# Patient Record
Sex: Male | Born: 1955
Health system: Southern US, Community
[De-identification: ages and names within clinical notes are randomized; demographics above are authoritative.]

## PROBLEM LIST (undated history)

## (undated) DIAGNOSIS — K429 Umbilical hernia without obstruction or gangrene: Secondary | ICD-10-CM

## (undated) DIAGNOSIS — K635 Polyp of colon: Secondary | ICD-10-CM

## (undated) DIAGNOSIS — N400 Enlarged prostate without lower urinary tract symptoms: Secondary | ICD-10-CM

## (undated) DIAGNOSIS — C679 Malignant neoplasm of bladder, unspecified: Secondary | ICD-10-CM

## (undated) DIAGNOSIS — I10 Essential (primary) hypertension: Secondary | ICD-10-CM

## (undated) DIAGNOSIS — C649 Malignant neoplasm of unspecified kidney, except renal pelvis: Secondary | ICD-10-CM

## (undated) DIAGNOSIS — D649 Anemia, unspecified: Secondary | ICD-10-CM

## (undated) DIAGNOSIS — N183 Chronic kidney disease, stage 3 unspecified: Secondary | ICD-10-CM

## (undated) DIAGNOSIS — G629 Polyneuropathy, unspecified: Secondary | ICD-10-CM

## (undated) DIAGNOSIS — Z905 Acquired absence of kidney: Secondary | ICD-10-CM

## (undated) DIAGNOSIS — R188 Other ascites: Secondary | ICD-10-CM

## (undated) DIAGNOSIS — K219 Gastro-esophageal reflux disease without esophagitis: Secondary | ICD-10-CM

## (undated) DIAGNOSIS — Z8739 Personal history of other diseases of the musculoskeletal system and connective tissue: Secondary | ICD-10-CM

## (undated) DIAGNOSIS — R0600 Dyspnea, unspecified: Secondary | ICD-10-CM

## (undated) DIAGNOSIS — E119 Type 2 diabetes mellitus without complications: Secondary | ICD-10-CM

## (undated) DIAGNOSIS — C642 Malignant neoplasm of left kidney, except renal pelvis: Secondary | ICD-10-CM

## (undated) DIAGNOSIS — K7460 Unspecified cirrhosis of liver: Secondary | ICD-10-CM

## (undated) HISTORY — DX: Malignant neoplasm of left kidney, except renal pelvis: C64.2

## (undated) HISTORY — PX: KNEE SURGERY: SHX244

## (undated) HISTORY — DX: Polyp of colon: K63.5

## (undated) HISTORY — PX: EYE SURGERY: SHX253

## (undated) HISTORY — PX: NEPHRECTOMY: SHX65

## (undated) HISTORY — PX: BACK SURGERY: SHX140

---

## 2010-05-09 ENCOUNTER — Observation Stay (HOSPITAL_COMMUNITY)
Admission: AD | Admit: 2010-05-09 | Discharge: 2010-05-11 | Disposition: A | Discharge status: Home / Self Care | Payer: Self-pay | Source: Other Acute Inpatient Hospital | Attending: Urology | Admitting: Urology

## 2010-05-09 ENCOUNTER — Emergency Department: Payer: Self-pay | Admitting: Emergency Medicine

## 2010-05-09 DIAGNOSIS — Z79899 Other long term (current) drug therapy: Secondary | ICD-10-CM | POA: Insufficient documentation

## 2010-05-09 DIAGNOSIS — R109 Unspecified abdominal pain: Secondary | ICD-10-CM | POA: Insufficient documentation

## 2010-05-09 DIAGNOSIS — E119 Type 2 diabetes mellitus without complications: Secondary | ICD-10-CM | POA: Insufficient documentation

## 2010-05-09 DIAGNOSIS — M129 Arthropathy, unspecified: Secondary | ICD-10-CM | POA: Insufficient documentation

## 2010-05-09 DIAGNOSIS — R1012 Left upper quadrant pain: Secondary | ICD-10-CM | POA: Insufficient documentation

## 2010-05-09 DIAGNOSIS — R112 Nausea with vomiting, unspecified: Secondary | ICD-10-CM | POA: Insufficient documentation

## 2010-05-09 DIAGNOSIS — N2889 Other specified disorders of kidney and ureter: Principal | ICD-10-CM | POA: Insufficient documentation

## 2010-05-09 DIAGNOSIS — I10 Essential (primary) hypertension: Secondary | ICD-10-CM | POA: Insufficient documentation

## 2010-05-09 LAB — GLUCOSE, CAPILLARY
Glucose-Capillary: 223 mg/dL — ABNORMAL HIGH (ref 70–99)
Glucose-Capillary: 209 mg/dL — ABNORMAL HIGH (ref 70–99)

## 2010-05-09 LAB — HEMOGLOBIN AND HEMATOCRIT, BLOOD
HCT: 32.4 % — ABNORMAL LOW (ref 39.0–52.0)
Hemoglobin: 10.2 g/dL — ABNORMAL LOW (ref 13.0–17.0)

## 2010-05-10 LAB — BASIC METABOLIC PANEL
CO2: 29 mEq/L (ref 19–32)
Chloride: 98 mEq/L (ref 96–112)
Creatinine, Ser: 1.83 mg/dL — ABNORMAL HIGH (ref 0.4–1.5)
GFR calc Af Amer: 47 mL/min — ABNORMAL LOW (ref >60)

## 2010-05-10 LAB — GLUCOSE, CAPILLARY: Glucose-Capillary: 219 mg/dL — ABNORMAL HIGH (ref 70–99)

## 2010-05-11 LAB — HEMOGLOBIN AND HEMATOCRIT, BLOOD: HCT: 28.1 % — ABNORMAL LOW (ref 39.0–52.0)

## 2010-05-12 LAB — GLUCOSE, CAPILLARY
Glucose-Capillary: 219 mg/dL — ABNORMAL HIGH (ref 70–99)
Glucose-Capillary: 112 mg/dL — ABNORMAL HIGH (ref 70–99)

## 2010-05-14 ENCOUNTER — Emergency Department: Payer: Self-pay | Admitting: Emergency Medicine

## 2010-05-31 NOTE — H&P (Signed)
NAME:  Rodney Stewart, Rodney Stewart                 ACCOUNT NO.:  [id number]  MEDICAL RECORD NO.:  [id number]           PATIENT TYPE:  O  LOCATION:  1419                         FACILITY:  [facility]  PHYSICIAN:  Bertram Millard. Chanceler Pullin, M.D.DATE OF BIRTH:  [birth year]  DATE OF ADMISSION:  05/09/2010 DATE OF DISCHARGE:                             HISTORY & PHYSICAL   CHIEF COMPLAINT:  Left perinephric hematoma.  HISTORY OF PRESENT ILLNESS:  This is a 55 year old gentleman who began having left flank pain and left upper quadrant pain that awakened him from his sleep approximately 1 a.m.  This pain was constant and persistent without any radiation.  It was associated with nausea and vomiting x3.  He presented to [facility] at that time as his pain did not subside.  Workup consisted of CT of abdomen and pelvis which showed a left perinephric hematoma.  Hemoglobin was stable at 13.5.  His urine micro showed rbc's 0-2.  He was transferred to [facility] from [facility] for further urologic evaluation.  He denies any trauma.  Although, he states while out cutting limbs from a palm tree approximately 2 days ago, 1 limb did fall and hit him in the left upper quadrant.  He denied any severe pain from that trauma.  He denies any bruising or use of pain medications for it.  He does state that he uses Goody's Headache Powder at least 6/7 days per week.  He denies any fever, chills, gross hematuria, lower urinary tract symptoms at this time.  He denies any chest pain, shortness of breath, or dark tarry stools.  He states his pain is 3 on a scale of 1-10 at this time.  PAST MEDICAL HISTORY: 1. Hypertension. 2. Type 2 diabetes mellitus. 3. Arthritis.  PAST SURGICAL HISTORY: 1. Back surgery. 2. Right knee surgery.  ALLERGIES:  He has no known drug allergies.  MEDICATIONS: 1. BC powders 1 packet. 2. Bystolic 10 mg once daily. 3. Glipizide 5 mg once  daily. 4. Metformin 1000 mg twice daily. 5. Diovan 320 mg once daily.  FAMILY HISTORY:  He denies any family history of kidney cancer or bladder cancer.  He states that his grandfather had prostate cancer.  He also has a significant family history for hypertension and type 2 diabetes.  SOCIAL HISTORY:  He lives in [place].  He has 2 daughters, one in 10th grade and one a senior at [employer].  He denies tobacco use.  He does use alcohol occasionally.  REVIEW OF SYSTEMS:  As stated per HPI.  PHYSICAL EXAMINATION:  VITAL SIGNS:  Temperature 98.0, pulse 96, respirations 16, blood pressure 117/77. GENERAL:  The patient is a well-developed, well-nourished white male, in no acute distress. HEENT:  Normocephalic, atraumatic.  Oropharynx is clear. CARDIOVASCULAR:  Regular rate and rhythm. LUNGS:  Clear to auscultation. ABDOMEN:  Soft, nontender, nondistended.  No hepatosplenomegaly.  No CVA tenderness. EXTREMITIES:  Nontender.  No atrophy. NEUROLOGIC:  Remote, recent memory intact. SKIN:  Warm and dry intact.  LABORATORY DATA:  WBCs 4.8, hemoglobin 13.5, hematocrit 38.7, platelets 196.  Sodium is  133, potassium 4.2, chloride 95, CO2 of 23, BUN 16, creatinine 1.65, glucose 228.  Urinalysis, specific gravity 1.022, pH 5.0, blood 1+, nitrite negative, leukocytes negative.  Urine micro, wbc's 0-2, rbc's 0-2.  RADIOLOGIC DATA:  CT of abdomen and pelvis shows left perinephric hematoma.  IMPRESSION/PLAN:  Left perinephric hematoma.  We will admit for 23-hour observation and place on bed rest.  We will obtain serial hemoglobin checks.  Dr. Retta Diones will see this gentleman later this afternoon.     Delia Chimes, NP   ______________________________ Bertram Millard. Surabhi Gadea, M.D.    MA/MEDQ  D:  05/09/2010  T:  05/10/2010  Job:  [id number]  Electronically Signed by Delia Chimes NP on 05/10/2010 10:08:47 AM Electronically Signed by Marcine Matar M.D. on 05/31/2010 12:49:55 PM

## 2012-01-02 DIAGNOSIS — C649 Malignant neoplasm of unspecified kidney, except renal pelvis: Secondary | ICD-10-CM | POA: Insufficient documentation

## 2012-01-02 HISTORY — DX: Malignant neoplasm of unspecified kidney, except renal pelvis: C64.9

## 2012-04-26 ENCOUNTER — Emergency Department: Payer: Self-pay | Admitting: Emergency Medicine

## 2012-04-26 LAB — CBC
HCT: 27 % — ABNORMAL LOW (ref 40.0–52.0)
HGB: 8.2 g/dL — ABNORMAL LOW (ref 13.0–18.0)
MCH: 21.1 pg — ABNORMAL LOW (ref 26.0–34.0)
MCHC: 30.4 g/dL — ABNORMAL LOW (ref 32.0–36.0)

## 2012-04-26 LAB — URINALYSIS, COMPLETE: Squamous Epithelial: NONE SEEN

## 2012-04-26 LAB — BASIC METABOLIC PANEL
Creatinine: 1.26 mg/dL (ref 0.60–1.30)
EGFR (Non-African Amer.): >60
Osmolality: 267 (ref 275–301)
Potassium: 4.6 mmol/L (ref 3.5–5.1)
Sodium: 133 mmol/L — ABNORMAL LOW (ref 136–145)

## 2012-04-27 ENCOUNTER — Emergency Department: Payer: Self-pay | Admitting: Internal Medicine

## 2012-04-28 ENCOUNTER — Emergency Department: Payer: Self-pay | Admitting: Emergency Medicine

## 2012-04-28 LAB — CBC WITH DIFFERENTIAL/PLATELET
Basophil #: 0 10*3/uL (ref 0.0–0.1)
MCV: 69 fL — ABNORMAL LOW (ref 80–100)
Monocyte %: 10.3 %
Neutrophil #: 4.1 10*3/uL (ref 1.4–6.5)
Platelet: 357 10*3/uL (ref 150–440)
RDW: 16.9 % — ABNORMAL HIGH (ref 11.5–14.5)

## 2012-04-28 LAB — COMPREHENSIVE METABOLIC PANEL
Albumin: 3.1 g/dL — ABNORMAL LOW (ref 3.4–5.0)
Alkaline Phosphatase: 181 U/L — ABNORMAL HIGH (ref 50–136)
BUN: 13 mg/dL (ref 7–18)
Bilirubin,Total: 0.8 mg/dL (ref 0.2–1.0)
Co2: 26 mmol/L (ref 21–32)
EGFR (Non-African Amer.): 52 — ABNORMAL LOW
SGOT(AST): 25 U/L (ref 15–37)
Sodium: 135 mmol/L — ABNORMAL LOW (ref 136–145)

## 2012-06-16 ENCOUNTER — Ambulatory Visit: Payer: Self-pay

## 2013-02-04 DIAGNOSIS — Z905 Acquired absence of kidney: Secondary | ICD-10-CM | POA: Insufficient documentation

## 2013-11-30 DIAGNOSIS — N183 Chronic kidney disease, stage 3 unspecified: Secondary | ICD-10-CM | POA: Insufficient documentation

## 2014-07-02 DIAGNOSIS — I10 Essential (primary) hypertension: Secondary | ICD-10-CM | POA: Insufficient documentation

## 2014-08-20 DIAGNOSIS — H269 Unspecified cataract: Secondary | ICD-10-CM | POA: Insufficient documentation

## 2014-09-17 DIAGNOSIS — E113399 Type 2 diabetes mellitus with moderate nonproliferative diabetic retinopathy without macular edema, unspecified eye: Secondary | ICD-10-CM | POA: Insufficient documentation

## 2015-03-15 ENCOUNTER — Ambulatory Visit: Payer: Self-pay | Admitting: Family Medicine

## 2015-08-28 DIAGNOSIS — Z23 Encounter for immunization: Secondary | ICD-10-CM | POA: Diagnosis not present

## 2015-09-30 ENCOUNTER — Emergency Department: Payer: Medicare HMO

## 2015-09-30 ENCOUNTER — Inpatient Hospital Stay
Admission: EM | Admit: 2015-09-30 | Discharge: 2015-10-03 | DRG: 181 | Disposition: A | Discharge status: Home / Self Care | Payer: Medicare HMO | Attending: Internal Medicine | Admitting: Internal Medicine

## 2015-09-30 DIAGNOSIS — R079 Chest pain, unspecified: Secondary | ICD-10-CM | POA: Diagnosis not present

## 2015-09-30 DIAGNOSIS — J209 Acute bronchitis, unspecified: Secondary | ICD-10-CM | POA: Diagnosis present

## 2015-09-30 DIAGNOSIS — Z79899 Other long term (current) drug therapy: Secondary | ICD-10-CM | POA: Diagnosis not present

## 2015-09-30 DIAGNOSIS — I129 Hypertensive chronic kidney disease with stage 1 through stage 4 chronic kidney disease, or unspecified chronic kidney disease: Secondary | ICD-10-CM | POA: Diagnosis not present

## 2015-09-30 DIAGNOSIS — D696 Thrombocytopenia, unspecified: Secondary | ICD-10-CM | POA: Diagnosis not present

## 2015-09-30 DIAGNOSIS — D61818 Other pancytopenia: Secondary | ICD-10-CM | POA: Diagnosis not present

## 2015-09-30 DIAGNOSIS — K760 Fatty (change of) liver, not elsewhere classified: Secondary | ICD-10-CM | POA: Diagnosis present

## 2015-09-30 DIAGNOSIS — Z8249 Family history of ischemic heart disease and other diseases of the circulatory system: Secondary | ICD-10-CM | POA: Diagnosis not present

## 2015-09-30 DIAGNOSIS — F1010 Alcohol abuse, uncomplicated: Secondary | ICD-10-CM | POA: Diagnosis present

## 2015-09-30 DIAGNOSIS — D631 Anemia in chronic kidney disease: Secondary | ICD-10-CM | POA: Diagnosis not present

## 2015-09-30 DIAGNOSIS — K8020 Calculus of gallbladder without cholecystitis without obstruction: Secondary | ICD-10-CM | POA: Diagnosis not present

## 2015-09-30 DIAGNOSIS — N183 Chronic kidney disease, stage 3 (moderate): Secondary | ICD-10-CM | POA: Diagnosis not present

## 2015-09-30 DIAGNOSIS — C799 Secondary malignant neoplasm of unspecified site: Secondary | ICD-10-CM | POA: Diagnosis not present

## 2015-09-30 DIAGNOSIS — R591 Generalized enlarged lymph nodes: Secondary | ICD-10-CM | POA: Diagnosis not present

## 2015-09-30 DIAGNOSIS — N179 Acute kidney failure, unspecified: Secondary | ICD-10-CM | POA: Diagnosis present

## 2015-09-30 DIAGNOSIS — E875 Hyperkalemia: Secondary | ICD-10-CM | POA: Diagnosis not present

## 2015-09-30 DIAGNOSIS — C7800 Secondary malignant neoplasm of unspecified lung: Secondary | ICD-10-CM | POA: Diagnosis present

## 2015-09-30 DIAGNOSIS — R911 Solitary pulmonary nodule: Secondary | ICD-10-CM | POA: Diagnosis not present

## 2015-09-30 DIAGNOSIS — R9389 Abnormal findings on diagnostic imaging of other specified body structures: Secondary | ICD-10-CM

## 2015-09-30 DIAGNOSIS — Z809 Family history of malignant neoplasm, unspecified: Secondary | ICD-10-CM

## 2015-09-30 DIAGNOSIS — Z794 Long term (current) use of insulin: Secondary | ICD-10-CM

## 2015-09-30 DIAGNOSIS — E785 Hyperlipidemia, unspecified: Secondary | ICD-10-CM | POA: Diagnosis present

## 2015-09-30 DIAGNOSIS — Z905 Acquired absence of kidney: Secondary | ICD-10-CM

## 2015-09-30 DIAGNOSIS — R590 Localized enlarged lymph nodes: Secondary | ICD-10-CM | POA: Diagnosis not present

## 2015-09-30 DIAGNOSIS — E871 Hypo-osmolality and hyponatremia: Secondary | ICD-10-CM | POA: Diagnosis not present

## 2015-09-30 DIAGNOSIS — M25512 Pain in left shoulder: Secondary | ICD-10-CM | POA: Diagnosis not present

## 2015-09-30 DIAGNOSIS — Z87891 Personal history of nicotine dependence: Secondary | ICD-10-CM

## 2015-09-30 DIAGNOSIS — C649 Malignant neoplasm of unspecified kidney, except renal pelvis: Secondary | ICD-10-CM | POA: Diagnosis not present

## 2015-09-30 DIAGNOSIS — R8299 Other abnormal findings in urine: Secondary | ICD-10-CM | POA: Diagnosis not present

## 2015-09-30 DIAGNOSIS — E1122 Type 2 diabetes mellitus with diabetic chronic kidney disease: Secondary | ICD-10-CM | POA: Diagnosis not present

## 2015-09-30 DIAGNOSIS — K7460 Unspecified cirrhosis of liver: Secondary | ICD-10-CM | POA: Diagnosis present

## 2015-09-30 DIAGNOSIS — R161 Splenomegaly, not elsewhere classified: Secondary | ICD-10-CM | POA: Diagnosis not present

## 2015-09-30 DIAGNOSIS — N400 Enlarged prostate without lower urinary tract symptoms: Secondary | ICD-10-CM | POA: Diagnosis present

## 2015-09-30 DIAGNOSIS — Z85528 Personal history of other malignant neoplasm of kidney: Secondary | ICD-10-CM

## 2015-09-30 DIAGNOSIS — R52 Pain, unspecified: Secondary | ICD-10-CM

## 2015-09-30 HISTORY — DX: Benign prostatic hyperplasia without lower urinary tract symptoms: N40.0

## 2015-09-30 HISTORY — DX: Malignant neoplasm of unspecified kidney, except renal pelvis: C64.9

## 2015-09-30 HISTORY — DX: Chronic kidney disease, stage 3 unspecified: N18.30

## 2015-09-30 HISTORY — DX: Acquired absence of kidney: Z90.5

## 2015-09-30 HISTORY — DX: Chronic kidney disease, stage 3 (moderate): N18.3

## 2015-09-30 HISTORY — DX: Type 2 diabetes mellitus without complications: E11.9

## 2015-09-30 HISTORY — DX: Essential (primary) hypertension: I10

## 2015-09-30 LAB — CBC WITH DIFFERENTIAL/PLATELET
BASOS ABS: 0 10*3/uL (ref 0–0.1)
Basophils Relative: 1 %
EOS PCT: 4 %
Eosinophils Absolute: 0.1 10*3/uL (ref 0–0.7)
HEMATOCRIT: 31 % — AB (ref 40.0–52.0)
HEMOGLOBIN: 10.5 g/dL — AB (ref 13.0–18.0)
LYMPHS ABS: 0.5 10*3/uL — AB (ref 1.0–3.6)
LYMPHS PCT: 15 %
MCH: 31.1 pg (ref 26.0–34.0)
MCHC: 33.8 g/dL (ref 32.0–36.0)
MCV: 92.3 fL (ref 80.0–100.0)
Monocytes Absolute: 0.3 10*3/uL (ref 0.2–1.0)
Monocytes Relative: 10 %
NEUTROS ABS: 2.2 10*3/uL (ref 1.4–6.5)
NEUTROS PCT: 70 %
Platelets: 105 10*3/uL — ABNORMAL LOW (ref 150–440)
RBC: 3.36 MIL/uL — AB (ref 4.40–5.90)
RDW: 13.4 % (ref 11.5–14.5)
WBC: 3.1 10*3/uL — AB (ref 3.8–10.6)

## 2015-09-30 LAB — COMPREHENSIVE METABOLIC PANEL
ALK PHOS: 231 U/L — AB (ref 38–126)
ALT: 28 U/L (ref 17–63)
AST: 46 U/L — AB (ref 15–41)
Albumin: 3.3 g/dL — ABNORMAL LOW (ref 3.5–5.0)
Anion gap: 6 (ref 5–15)
BILIRUBIN TOTAL: 1.5 mg/dL — AB (ref 0.3–1.2)
BUN: 25 mg/dL — AB (ref 6–20)
CALCIUM: 9.6 mg/dL (ref 8.9–10.3)
CO2: 27 mmol/L (ref 22–32)
CREATININE: 3.53 mg/dL — AB (ref 0.61–1.24)
Chloride: 95 mmol/L — ABNORMAL LOW (ref 101–111)
GFR, EST AFRICAN AMERICAN: 20 mL/min — AB (ref >60)
GFR, EST NON AFRICAN AMERICAN: 18 mL/min — AB (ref >60)
Glucose, Bld: 376 mg/dL — ABNORMAL HIGH (ref 65–99)
Potassium: 5.3 mmol/L — ABNORMAL HIGH (ref 3.5–5.1)
Sodium: 128 mmol/L — ABNORMAL LOW (ref 135–145)
Total Protein: 7.4 g/dL (ref 6.5–8.1)

## 2015-09-30 LAB — PROTIME-INR
INR: 1.15
PROTHROMBIN TIME: 14.8 s (ref 11.4–15.2)

## 2015-09-30 LAB — TROPONIN I
Troponin I: <0.03 ng/mL (ref <0.03)
Troponin I: <0.03 ng/mL (ref <0.03)

## 2015-09-30 LAB — GLUCOSE, CAPILLARY
Glucose-Capillary: 293 mg/dL — ABNORMAL HIGH (ref 65–99)
Glucose-Capillary: 294 mg/dL — ABNORMAL HIGH (ref 65–99)
Glucose-Capillary: 270 mg/dL — ABNORMAL HIGH (ref 65–99)

## 2015-09-30 LAB — NA AND K (SODIUM & POTASSIUM), RAND UR
Potassium Urine: 27 mmol/L
Sodium, Ur: 47 mmol/L

## 2015-09-30 LAB — CK: CK TOTAL: 42 U/L — AB (ref 49–397)

## 2015-09-30 MED ORDER — ACETAMINOPHEN 325 MG PO TABS
650.0000 mg | ORAL_TABLET | Freq: Four times a day (QID) | ORAL | Status: DC | PRN
  Administered 2015-10-02: 650 mg via ORAL
  Filled 2015-09-30: qty 2

## 2015-09-30 MED ORDER — FENTANYL CITRATE (PF) 100 MCG/2ML IJ SOLN
100.0000 ug | INTRAMUSCULAR | Status: DC | PRN
  Administered 2015-09-30 – 2015-10-01 (×6): 100 ug via INTRAVENOUS
  Filled 2015-09-30 (×6): qty 2

## 2015-09-30 MED ORDER — ASPIRIN 325 MG PO TABS
325.0000 mg | ORAL_TABLET | Freq: Every day | ORAL | Status: DC
  Administered 2015-09-30 – 2015-10-01 (×2): 325 mg via ORAL
  Filled 2015-09-30 (×2): qty 1

## 2015-09-30 MED ORDER — HEPARIN SODIUM (PORCINE) 5000 UNIT/ML IJ SOLN
5000.0000 [IU] | Freq: Three times a day (TID) | INTRAMUSCULAR | Status: DC
  Administered 2015-09-30 – 2015-10-02 (×6): 5000 [IU] via SUBCUTANEOUS
  Filled 2015-09-30 (×5): qty 1

## 2015-09-30 MED ORDER — INSULIN ASPART 100 UNIT/ML ~~LOC~~ SOLN
0.0000 [IU] | Freq: Three times a day (TID) | SUBCUTANEOUS | Status: DC
  Administered 2015-09-30: 5 [IU] via SUBCUTANEOUS
  Administered 2015-10-01: 3 [IU] via SUBCUTANEOUS
  Administered 2015-10-01: 2 [IU] via SUBCUTANEOUS
  Administered 2015-10-02: 1 [IU] via SUBCUTANEOUS
  Filled 2015-09-30: qty 3
  Filled 2015-09-30: qty 5
  Filled 2015-09-30: qty 2
  Filled 2015-09-30: qty 1

## 2015-09-30 MED ORDER — ALBUTEROL SULFATE (2.5 MG/3ML) 0.083% IN NEBU: 2.5000 mg | INHALATION_SOLUTION | RESPIRATORY_TRACT | Status: DC | PRN

## 2015-09-30 MED ORDER — INSULIN ASPART 100 UNIT/ML ~~LOC~~ SOLN
0.0000 [IU] | Freq: Every day | SUBCUTANEOUS | Status: DC
  Administered 2015-09-30: 3 [IU] via SUBCUTANEOUS
  Administered 2015-10-02: 2 [IU] via SUBCUTANEOUS
  Filled 2015-09-30: qty 3
  Filled 2015-09-30: qty 2

## 2015-09-30 MED ORDER — FOLIC ACID 1 MG PO TABS
1.0000 mg | ORAL_TABLET | Freq: Every day | ORAL | Status: DC
Start: 2015-09-30 — End: 2015-10-03
  Administered 2015-10-01 – 2015-10-03 (×3): 1 mg via ORAL
  Filled 2015-09-30 (×3): qty 1

## 2015-09-30 MED ORDER — ONDANSETRON HCL 4 MG/2ML IJ SOLN
4.0000 mg | Freq: Four times a day (QID) | INTRAMUSCULAR | Status: DC | PRN
  Administered 2015-09-30 – 2015-10-02 (×2): 4 mg via INTRAVENOUS
  Filled 2015-09-30 (×2): qty 2

## 2015-09-30 MED ORDER — SODIUM CHLORIDE 0.9 % IV SOLN
INTRAVENOUS | Status: DC
  Administered 2015-09-30: 17:00:00 via INTRAVENOUS
  Administered 2015-10-01: 1000 mL via INTRAVENOUS
  Administered 2015-10-01: via INTRAVENOUS
  Administered 2015-10-01: 1000 mL via INTRAVENOUS
  Administered 2015-10-02 – 2015-10-03 (×2): via INTRAVENOUS

## 2015-09-30 MED ORDER — LORAZEPAM 1 MG PO TABS
1.0000 mg | ORAL_TABLET | Freq: Four times a day (QID) | ORAL | Status: DC | PRN
  Administered 2015-10-01: 1 mg via ORAL
  Filled 2015-09-30: qty 1

## 2015-09-30 MED ORDER — INSULIN DETEMIR 100 UNIT/ML ~~LOC~~ SOLN
48.0000 [IU] | Freq: Every day | SUBCUTANEOUS | Status: DC
  Administered 2015-09-30 – 2015-10-02 (×3): 48 [IU] via SUBCUTANEOUS
  Filled 2015-09-30 (×5): qty 0.48

## 2015-09-30 MED ORDER — THIAMINE HCL 100 MG/ML IJ SOLN: 100.0000 mg | Freq: Every day | INTRAMUSCULAR | Status: DC

## 2015-09-30 MED ORDER — GABAPENTIN 100 MG PO CAPS
100.0000 mg | ORAL_CAPSULE | Freq: Three times a day (TID) | ORAL | Status: DC
  Administered 2015-09-30 – 2015-10-03 (×9): 100 mg via ORAL
  Filled 2015-09-30 (×9): qty 1

## 2015-09-30 MED ORDER — INSULIN ASPART 100 UNIT/ML ~~LOC~~ SOLN
4.0000 [IU] | Freq: Once | SUBCUTANEOUS | Status: AC
  Administered 2015-09-30: 4 [IU] via INTRAVENOUS
  Filled 2015-09-30: qty 4

## 2015-09-30 MED ORDER — PRAVASTATIN SODIUM 20 MG PO TABS
20.0000 mg | ORAL_TABLET | Freq: Every day | ORAL | Status: DC
  Administered 2015-09-30 – 2015-10-02 (×3): 20 mg via ORAL
  Filled 2015-09-30 (×3): qty 1

## 2015-09-30 MED ORDER — SODIUM CHLORIDE 0.9% FLUSH
3.0000 mL | Freq: Two times a day (BID) | INTRAVENOUS | Status: DC
  Administered 2015-10-01: 3 mL via INTRAVENOUS

## 2015-09-30 MED ORDER — ADULT MULTIVITAMIN W/MINERALS CH
1.0000 | ORAL_TABLET | Freq: Every day | ORAL | Status: DC
  Administered 2015-10-01 – 2015-10-03 (×3): 1 via ORAL
  Filled 2015-09-30 (×3): qty 1

## 2015-09-30 MED ORDER — TAMSULOSIN HCL 0.4 MG PO CAPS
0.4000 mg | ORAL_CAPSULE | Freq: Every day | ORAL | Status: DC
  Administered 2015-09-30 – 2015-10-03 (×4): 0.4 mg via ORAL
  Filled 2015-09-30 (×4): qty 1

## 2015-09-30 MED ORDER — NEBIVOLOL HCL 5 MG PO TABS
20.0000 mg | ORAL_TABLET | Freq: Every day | ORAL | Status: DC
  Administered 2015-09-30 – 2015-10-03 (×4): 20 mg via ORAL
  Filled 2015-09-30 (×5): qty 4

## 2015-09-30 MED ORDER — ACETAMINOPHEN 650 MG RE SUPP: 650.0000 mg | Freq: Four times a day (QID) | RECTAL | Status: DC | PRN

## 2015-09-30 MED ORDER — VITAMIN B-1 100 MG PO TABS
100.0000 mg | ORAL_TABLET | Freq: Every day | ORAL | Status: DC
  Administered 2015-10-01 – 2015-10-03 (×3): 100 mg via ORAL
  Filled 2015-09-30 (×3): qty 1

## 2015-09-30 MED ORDER — SODIUM CHLORIDE 0.9 % IV BOLUS (SEPSIS)
1000.0000 mL | Freq: Once | INTRAVENOUS | Status: AC
  Administered 2015-09-30: 1000 mL via INTRAVENOUS

## 2015-09-30 MED ORDER — LORAZEPAM 2 MG/ML IJ SOLN: 1.0000 mg | Freq: Four times a day (QID) | INTRAMUSCULAR | Status: DC | PRN

## 2015-09-30 MED ORDER — ONDANSETRON HCL 4 MG PO TABS: 4.0000 mg | ORAL_TABLET | Freq: Four times a day (QID) | ORAL | Status: DC | PRN

## 2015-09-30 NOTE — H&P (Addendum)
[facility] [place] NAME: Rodney Stewart    MR#:  [id number]  DATE OF BIRTH:  October 02, 1955  DATE OF ADMISSION:  09/30/2015  PRIMARY CARE PHYSICIAN: No primary care provider on file.   REQUESTING/REFERRING PHYSICIAN: Merlyn Lot, MD  CHIEF COMPLAINT:   Chief Complaint  Patient presents with  . Chest Pain   left shoulder blade pain for 5 days. HISTORY OF PRESENT ILLNESS:  Rodney Stewart  is a 60 y.o. male with a known history of left shoulder blade pain for 5 days. Patient was treated with antibiotics for acute bronchitis 3 weeks ago. States that his cough at that time did significantly improved. He complains of left shoulder pain radiation to the chest. But he denies any nausea, vomiting or diaphoresis. He denies any weight loss. He was found to have acute renal failure with creatinine up to 3.53. PAST MEDICAL HISTORY:   Past Medical History:  Diagnosis Date  . BPH (benign prostatic hyperplasia)   . CKD (chronic kidney disease) stage 3, GFR 30-59 ml/min   . Clear cell renal cell carcinoma (HCC)   . Diabetes mellitus without complication ([place])   . History of nephrectomy   . Hypertension   single kidney.  PAST SURGICAL HISTORY:   Past Surgical History:  Procedure Laterality Date  . KNEE SURGERY    . NEPHRECTOMY      SOCIAL HISTORY:   Social History  Substance Use Topics  . Smoking status: Never Smoker  . Smokeless tobacco: Never Used  . Alcohol use 25.2 oz/week    42 Cans of beer per week     Comment: 5-6 cans a day    FAMILY HISTORY:   Family History  Problem Relation Age of Onset  . Cancer Mother   . Diabetes Father   . Hypertension Father     DRUG ALLERGIES:  No Known Allergies  REVIEW OF SYSTEMS:   Review of Systems  Constitutional: Negative for chills and fever.  HENT: Negative for sore throat.   Eyes: Negative for blurred vision and double vision.  Respiratory: Positive for cough. Negative for  hemoptysis, shortness of breath, wheezing and stridor.   Cardiovascular: Positive for chest pain. Negative for orthopnea and leg swelling.  Gastrointestinal: Negative for abdominal pain, blood in stool, melena, nausea and vomiting.  Genitourinary: Negative for dysuria, frequency and urgency.  Musculoskeletal: Positive for joint pain.       Left shoulder pain on the back  Skin: Negative for rash.  Neurological: Negative for dizziness, focal weakness, seizures and loss of consciousness.  Psychiatric/Behavioral: Negative for depression. The patient is not nervous/anxious.     MEDICATIONS AT HOME:   Prior to Admission medications   Medication Sig Start Date End Date Taking? Authorizing Provider  colchicine 0.6 MG tablet Take 0.6 mg by mouth daily.  06/26/15  Yes Historical Provider, MD  gabapentin (NEURONTIN) 100 MG capsule Take 100 mg by mouth 3 (three) times daily.  07/19/15  Yes Historical Provider, MD  LEVEMIR FLEXTOUCH 100 UNIT/ML Pen Inject 48 Units into the skin daily at 10 pm.  09/28/15  Yes Historical Provider, MD  losartan (COZAAR) 100 MG tablet Take 100 mg by mouth daily.  07/06/15  Yes Historical Provider, MD  lovastatin (MEVACOR) 20 MG tablet Take 20 mg by mouth daily at 6 PM.  07/20/15  Yes Historical Provider, MD  Nebivolol HCl 20 MG TABS Take 20 mg by mouth daily.   Yes Historical Provider, MD  NOVOLOG 100 UNIT/ML injection Inject 12 Units into the skin daily at 12 noon. At noon. 08/02/15  Yes Historical Provider, MD  tamsulosin (FLOMAX) 0.4 MG CAPS capsule Take 0.4 mg by mouth daily.  08/02/15  Yes Historical Provider, MD      VITAL SIGNS:  Blood pressure (!) 151/84, pulse 65, temperature 98.9 F (37.2 C), temperature source Oral, resp. rate 14, height '5\' 11"'$  (1.803 m), weight 190 lb (86.2 kg), SpO2 98 %.  PHYSICAL EXAMINATION:  Physical Exam  GENERAL:  60 y.o.-year-old patient lying in the bed with no acute distress.  EYES: Pupils equal, round, reactive to light and  accommodation. No scleral icterus. Extraocular muscles intact.  HEENT: Head atraumatic, normocephalic. Oropharynx and nasopharynx clear. moist mucosa. NECK:  Supple, no jugular venous distention. No thyroid enlargement, no tenderness.  LUNGS: Normal breath sounds bilaterally, no wheezing, rales,rhonchi or crepitation. No use of accessory muscles of respiration.  CARDIOVASCULAR: S1, S2 normal. No murmurs, rubs, or gallops.  ABDOMEN: Soft, nontender, nondistended. Bowel sounds present. No organomegaly or mass.  EXTREMITIES: No pedal edema, cyanosis, or clubbing.  NEUROLOGIC: Cranial nerves II through XII are intact. Muscle strength 5/5 in all extremities. Sensation intact. Gait not checked.  PSYCHIATRIC: The patient is alert and oriented x 3.  SKIN: No obvious rash, lesion, or ulcer.   LABORATORY PANEL:   CBC  Recent Labs Lab 09/30/15 1145  WBC 3.1*  HGB 10.5*  HCT 31.0*  PLT 105*   ------------------------------------------------------------------------------------------------------------------  Chemistries   Recent Labs Lab 09/30/15 1145  NA 128*  K 5.3*  CL 95*  CO2 27  GLUCOSE 376*  BUN 25*  CREATININE 3.53*  CALCIUM 9.6  AST 46*  ALT 28  ALKPHOS 231*  BILITOT 1.5*   ------------------------------------------------------------------------------------------------------------------  Cardiac Enzymes  Recent Labs Lab 09/30/15 1145  TROPONINI <0.03   ------------------------------------------------------------------------------------------------------------------  RADIOLOGY:  Dg Chest 2 View  Result Date: 09/30/2015 CLINICAL DATA:  Chest pain radiating. left shoulder pain. EXAM: CHEST  2 VIEW FINDINGS: Mediastinum is unremarkable. Left hilar mass is noted. Although this could be vascular, adenopathy or malignancy cannot be excluded. Questionable pulmonary nodule in the right upper lobe. IV contrast-enhanced chest CT suggest for further evaluation these findings.  Heart size normal. No pleural effusion or pneumothorax. No acute bony abnormality. IMPRESSION: 1. Left hilar fullness/mass. 2. Questionable right upper lobe pulmonary nodule. IV contrast-enhanced chest CT is suggested for further evaluation of these findings. Electronically Signed   By: Marcello Moores  Register   On: 09/30/2015 11:26   Ct Chest Wo Contrast  Result Date: 09/30/2015 CLINICAL DATA:  60 year old male with history of renal cancer presents for evaluation of a questionable left hilar mass on chest radiograph from earlier today. EXAM: CT CHEST WITHOUT CONTRAST TECHNIQUE: Multidetector CT imaging of the chest was performed following the standard protocol without IV contrast. COMPARISON:  Chest radiograph from earlier today. No prior chest CT. 05/09/2010 CT abdomen/pelvis. FINDINGS: Cardiovascular: Normal heart size. No significant pericardial fluid/thickening. Left main, left anterior descending, left circumflex and right coronary atherosclerosis. Atherosclerotic nonaneurysmal thoracic aorta. Normal caliber pulmonary arteries. Mediastinum/Nodes: No discrete thyroid nodules. Unremarkable esophagus. No axillary adenopathy. Bulky 4.2 cm subcarinal node (series 2/ image 73). Enlarged 1.8 cm left hilar node (series 2/ image 71). Enlarged 1.5 cm right hilar node (series 2/ image 69). No additional pathologically enlarged mediastinal or gross hilar nodes on this noncontrast study. Lungs/Pleura: No pneumothorax. No pleural effusion. There are greater than 20 solid pulmonary nodules of various size randomly distributed throughout  both lungs involving all lung lobes, largest 2.4 cm in the left lower lobe (series 4/ image 98), 1.7 cm in the right upper lobe (series 4/ image 47) and 0.8 cm in the left upper lobe (series 4/ image 35). No acute consolidative airspace disease. Upper abdomen: Liver surface is diffusely irregular indicating cirrhosis. Small volume perihepatic ascites. Cholelithiasis. Splenomegaly.  Musculoskeletal: No aggressive appearing focal osseous lesions. Mild thoracic spondylosis. IMPRESSION: 1. Bulky subcarinal and mild bilateral hilar lymphadenopathy, most consistent with metastatic disease given the reported history of renal cancer. 2. Innumerable (> than 20) pulmonary nodules randomly distributed throughout both lungs measuring up to 2.4 cm in the left lower lobe, most consistent with pulmonary metastases given the reported history of renal cancer . 3. Cirrhosis.  Small volume perihepatic ascites.  Splenomegaly. 4. Additional findings include aortic atherosclerosis, left main and 3 vessel coronary atherosclerosis and cholelithiasis. Electronically Signed   By: Ilona Sorrel M.D.   On: 09/30/2015 13:09   US Renal  Result Date: 09/30/2015 CLINICAL DATA:  Lung mass status post renal cancer EXAM: RENAL / URINARY TRACT ULTRASOUND COMPLETE COMPARISON:  CT abdomen 05/09/2010 FINDINGS: Right Kidney: Length: 11.4 cm. Echogenicity within normal limits. No mass or hydronephrosis visualized. Left Kidney: Surgically absent. Bladder: Appears normal for degree of bladder distention. Other:  Splenic enlargement measuring 910 mL. IMPRESSION: 1. Normal right kidney. 2. Surgically absent left kidney. 3. Splenomegaly. Electronically Signed   By: Kathreen Devoid   On: 09/30/2015 13:32      IMPRESSION AND PLAN:   Acute renal failure Start Normal saline IV, hold losartan and colchicine, follow-up BMP and renal ultrasound.  Hyponatremia. Continue normal saline and follow-up BMP.  Hyperkalemia. Continue IV fluid support and follow-up K.  Left back shoulder pain with Chest pain. Likely due to metastasis. Continue aspirin and follow-up troponin. Pain control.  History of renal carcinoma with Bulky subcarinal and mild bilateral hilar lymphadenopathy, lung nodules, most consistent with metastatic disease. Oncology consult.  Pancytopenia. Follow-up CBC and oncology consult. Anemia of chronic disease.  Stable.  Hypertension. Continue hypertension medication Diabetes. Start sliding scale and continue Levemir.  Alcohol abuse. Start CIWA protocol  All the records are reviewed and case discussed with ED provider. Management plans discussed with the patient, family and they are in agreement.  CODE STATUS: Full code  TOTAL TIME TAKING CARE OF THIS PATIENT: 58 minutes.    Demetrios Loll M.D on 09/30/2015 at 2:06 PM  Between 7am to 6pm - Pager - [phone]  After 6pm go to [email or web address] - [employer] [facility]  Office  [phone]  CC: Primary care physician; No primary care provider on file.   Note: This dictation was prepared with Dragon dictation along with smaller phrase technology. Any transcriptional errors that result from this process are unintentional.

## 2015-09-30 NOTE — ED Provider Notes (Signed)
[facility] Emergency Department Provider Note    First MD Initiated Contact with Patient 09/30/15 1119     (approximate)  I have reviewed the triage vital signs and the nursing notes.   HISTORY  Chief Complaint Chest Pain    HPI Rodney Stewart is a 60 y.o. male with a history of diabetes and hypertension presents with 5 days of left shoulder blade pain. Patient was treated with antibiotics for acute bronchitis 3 weeks ago. States that his cough at that time did significantly improve. Denies any cough or shortness of breath at this time but states that the pain is unrelenting. No fevers. States that the pain is focused on the left posterior shoulder. Does have unrelenting 5 out of 10 anterior chest pain no recent trauma. Denies any previous history of coronary disease. Is not a current smoker.   Past Medical History:  Diagnosis Date  . Diabetes mellitus without complication ([place])   . Hypertension     Patient Active Problem List   Diagnosis Date Noted  . ARF (acute renal failure) ([place]) 09/30/2015    History reviewed. No pertinent surgical history.  Prior to Admission medications   Medication Sig Start Date End Date Taking? Authorizing Provider  colchicine 0.6 MG tablet Take 0.6 mg by mouth daily.  06/26/15  Yes Historical Provider, MD  gabapentin (NEURONTIN) 100 MG capsule Take 100 mg by mouth 3 (three) times daily.  07/19/15  Yes Historical Provider, MD  LEVEMIR FLEXTOUCH 100 UNIT/ML Pen Inject 48 Units into the skin daily at 10 pm.  09/28/15  Yes Historical Provider, MD  losartan (COZAAR) 100 MG tablet Take 100 mg by mouth daily.  07/06/15  Yes Historical Provider, MD  lovastatin (MEVACOR) 20 MG tablet Take 20 mg by mouth daily at 6 PM.  07/20/15  Yes Historical Provider, MD  Nebivolol HCl 20 MG TABS Take 20 mg by mouth daily.   Yes Historical Provider, MD  NOVOLOG 100 UNIT/ML injection Inject 12 Units into the skin daily at 12 noon. At noon. 08/02/15  Yes  Historical Provider, MD  tamsulosin (FLOMAX) 0.4 MG CAPS capsule Take 0.4 mg by mouth daily.  08/02/15  Yes Historical Provider, MD    Allergies Review of patient's allergies indicates no known allergies.  No family history on file.  Social History Social History  Substance Use Topics  . Smoking status: Never Smoker  . Smokeless tobacco: Not on file  . Alcohol use No    Review of Systems Patient denies headaches, rhinorrhea, blurry vision, numbness, shortness of breath, chest pain, edema, cough, abdominal pain, nausea, vomiting, diarrhea, dysuria, fevers, rashes or hallucinations unless otherwise stated above in HPI. ____________________________________________   PHYSICAL EXAM:  VITAL SIGNS: Vitals:   09/30/15 1059 09/30/15 1152  BP: (!) 158/84 (!) 151/84  Pulse: 70 65  Resp: 18 14  Temp: 98.9 F (37.2 C)     Constitutional: Alert and oriented. Well appearing and in no acute distress. Eyes: Conjunctivae are normal. PERRL. EOMI. Head: Atraumatic. Nose: No congestion/rhinnorhea. Mouth/Throat: Mucous membranes are moist.  Oropharynx non-erythematous. Neck: No stridor. Painless ROM. No cervical spine tenderness to palpation Hematological/Lymphatic/Immunilogical: No cervical lymphadenopathy. Cardiovascular: Normal rate, regular rhythm. Grossly normal heart sounds.  Good peripheral circulation. Respiratory: Normal respiratory effort.  No retractions. Lungs CTAB. Gastrointestinal: Soft and nontender. No distention. No abdominal bruits. No CVA tenderness. Genitourinary:  Musculoskeletal: ttp of trapezius insertion of left scapula. No ecchymosis or rashes.  No lower extremity tenderness nor edema.  No joint effusions. Neurologic:  Normal speech and language. No gross focal neurologic deficits are appreciated. No gait instability. Skin:  Skin is warm, dry and intact. No rash noted. Psychiatric: Mood and affect are normal. Speech and behavior are  normal.  ____________________________________________   LABS (all labs ordered are listed, but only abnormal results are displayed)  Results for orders placed or performed during the hospital encounter of 09/30/15 (from the past 24 hour(s))  Troponin I     Status: None   Collection Time: 09/30/15 11:45 AM  Result Value Ref Range   Troponin I <0.03 <0.03 ng/mL  CBC with Differential/Platelet     Status: Abnormal   Collection Time: 09/30/15 11:45 AM  Result Value Ref Range   WBC 3.1 (L) 3.8 - 10.6 K/uL   RBC 3.36 (L) 4.40 - 5.90 MIL/uL   Hemoglobin 10.5 (L) 13.0 - 18.0 g/dL   HCT 31.0 (L) 40.0 - 52.0 %   MCV 92.3 80.0 - 100.0 fL   MCH 31.1 26.0 - 34.0 pg   MCHC 33.8 32.0 - 36.0 g/dL   RDW 13.4 11.5 - 14.5 %   Platelets 105 (L) 150 - 440 K/uL   Neutrophils Relative % 70 %   Neutro Abs 2.2 1.4 - 6.5 K/uL   Lymphocytes Relative 15 %   Lymphs Abs 0.5 (L) 1.0 - 3.6 K/uL   Monocytes Relative 10 %   Monocytes Absolute 0.3 0.2 - 1.0 K/uL   Eosinophils Relative 4 %   Eosinophils Absolute 0.1 0 - 0.7 K/uL   Basophils Relative 1 %   Basophils Absolute 0.0 0 - 0.1 K/uL  Comprehensive metabolic panel     Status: Abnormal   Collection Time: 09/30/15 11:45 AM  Result Value Ref Range   Sodium 128 (L) 135 - 145 mmol/L   Potassium 5.3 (H) 3.5 - 5.1 mmol/L   Chloride 95 (L) 101 - 111 mmol/L   CO2 27 22 - 32 mmol/L   Glucose, Bld 376 (H) 65 - 99 mg/dL   BUN 25 (H) 6 - 20 mg/dL   Creatinine, Ser 3.53 (H) 0.61 - 1.24 mg/dL   Calcium 9.6 8.9 - 10.3 mg/dL   Total Protein 7.4 6.5 - 8.1 g/dL   Albumin 3.3 (L) 3.5 - 5.0 g/dL   AST 46 (H) 15 - 41 U/L   ALT 28 17 - 63 U/L   Alkaline Phosphatase 231 (H) 38 - 126 U/L   Total Bilirubin 1.5 (H) 0.3 - 1.2 mg/dL   GFR calc non Af Amer 18 (L) >60 mL/min   GFR calc Af Amer 20 (L) >60 mL/min   Anion gap 6 5 - 15  CK     Status: Abnormal   Collection Time: 09/30/15 11:45 AM  Result Value Ref Range   Total CK 42 (L) 49 - 397 U/L  Protime-INR      Status: None   Collection Time: 09/30/15 11:45 AM  Result Value Ref Range   Prothrombin Time 14.8 11.4 - 15.2 seconds   INR 1.15    ____________________________________________  EKG My review and personal interpretation at Time: 10:58   Indication: chest pain  Rate: 70  Rhythm: nsr Axis: normal Other: normal EKG, normal intervals ____________________________________________  RADIOLOGY  I personally reviewed all radiographic images and reviewed radiology reports and findings.  These findings were personally discussed with the patient.  Please see medical record for radiology report. ____________________________________________   PROCEDURES  Procedure(s) performed: none    Critical Care  performed: no ____________________________________________   INITIAL IMPRESSION / ASSESSMENT AND PLAN / ED COURSE  Pertinent labs & imaging results that were available during my care of the patient were reviewed by me and considered in my medical decision making (see chart for details).  DDX: ACS, pericarditis, esophagitis, boerhaaves, pe, dissection, pna, bronchitis, costochondritis   Rodney Stewart is a 60 y.o. who presents to the ED with chief complaint of chest pain for the past 5 days. No clinical exam findings to suggest pneumonia but will order chest x-ray to further evaluate. EKG is normal and reassuring. Patient afebrile and otherwise hemodynamically stable.The patient will be placed on continuous pulse oximetry and telemetry for monitoring.  Laboratory evaluation will be sent to evaluate for the above complaints.     Clinical Course  Comment By Time  Patient does have evidence of acute renal insufficiency without metabolic acidosis. Does appear to be intrinsic cystoscopy with hyperkalemia. Also with leukopenia and low platelets.  Chest x-ray does show evidence of hilar mass concerning for metastasis. Will order CT scan to further characterize. Merlyn Lot, MD 09/29 1233  The  patient's daughter took me aside after speaking with the patient and gave me further information the patient has had decreased oral intake but does significant alcohol abuse history Merlyn Lot, MD 09/29 1301  I discussed the findings and results of CT imaging with the patient and concern for probable metastatic disease. Transferred to the best of my ability.  Have discussed with the patient and available family all diagnostics and treatments performed thus far and all questions were answered to the best of my ability. The patient demonstrates understanding and agreement with plan.  Merlyn Lot, MD 09/29 1343     ____________________________________________   FINAL CLINICAL IMPRESSION(S) / ED DIAGNOSES  Final diagnoses:  Chest pain, unspecified chest pain type  Abnormal finding on imaging  AKI (acute kidney injury) ([place])      NEW MEDICATIONS STARTED DURING THIS VISIT:  New Prescriptions   No medications on file     Note:  This document was prepared using Dragon voice recognition software and may include unintentional dictation errors.    Merlyn Lot, MD 09/30/15 1415

## 2015-09-30 NOTE — ED Notes (Signed)
Patient transported to X-ray 

## 2015-09-30 NOTE — ED Triage Notes (Signed)
CP to left shoulder blade radiating to center of chest X 5 days. Pt recently treated for bronchitis X 3 weeks ago. Non productive cough. Constant CP. Pt alert and oriented X4, active, cooperative, pt in NAD. RR even and unlabored, color WNL.

## 2015-10-01 ENCOUNTER — Inpatient Hospital Stay: Payer: Medicare HMO

## 2015-10-01 DIAGNOSIS — Z85528 Personal history of other malignant neoplasm of kidney: Secondary | ICD-10-CM

## 2015-10-01 DIAGNOSIS — C7800 Secondary malignant neoplasm of unspecified lung: Secondary | ICD-10-CM

## 2015-10-01 DIAGNOSIS — I129 Hypertensive chronic kidney disease with stage 1 through stage 4 chronic kidney disease, or unspecified chronic kidney disease: Secondary | ICD-10-CM

## 2015-10-01 DIAGNOSIS — D631 Anemia in chronic kidney disease: Secondary | ICD-10-CM

## 2015-10-01 DIAGNOSIS — N179 Acute kidney failure, unspecified: Secondary | ICD-10-CM

## 2015-10-01 DIAGNOSIS — C649 Malignant neoplasm of unspecified kidney, except renal pelvis: Secondary | ICD-10-CM

## 2015-10-01 DIAGNOSIS — N183 Chronic kidney disease, stage 3 (moderate): Secondary | ICD-10-CM

## 2015-10-01 DIAGNOSIS — D696 Thrombocytopenia, unspecified: Secondary | ICD-10-CM

## 2015-10-01 LAB — CBC
HCT: 26.8 % — ABNORMAL LOW (ref 40.0–52.0)
HEMOGLOBIN: 9.2 g/dL — AB (ref 13.0–18.0)
MCH: 31.6 pg (ref 26.0–34.0)
MCHC: 34.3 g/dL (ref 32.0–36.0)
MCV: 92 fL (ref 80.0–100.0)
PLATELETS: 97 10*3/uL — AB (ref 150–440)
RBC: 2.91 MIL/uL — AB (ref 4.40–5.90)
RDW: 13.2 % (ref 11.5–14.5)
WBC: 4.1 10*3/uL (ref 3.8–10.6)

## 2015-10-01 LAB — GLUCOSE, CAPILLARY
GLUCOSE-CAPILLARY: 226 mg/dL — AB (ref 65–99)
GLUCOSE-CAPILLARY: 183 mg/dL — AB (ref 65–99)
Glucose-Capillary: 110 mg/dL — ABNORMAL HIGH (ref 65–99)
Glucose-Capillary: 181 mg/dL — ABNORMAL HIGH (ref 65–99)

## 2015-10-01 LAB — BASIC METABOLIC PANEL
ANION GAP: 7 (ref 5–15)
BUN: 28 mg/dL — ABNORMAL HIGH (ref 6–20)
CALCIUM: 8.2 mg/dL — AB (ref 8.9–10.3)
CHLORIDE: 104 mmol/L (ref 101–111)
CO2: 22 mmol/L (ref 22–32)
CREATININE: 3.81 mg/dL — AB (ref 0.61–1.24)
GFR, EST AFRICAN AMERICAN: 19 mL/min — AB (ref >60)
GFR, EST NON AFRICAN AMERICAN: 16 mL/min — AB (ref >60)
Glucose, Bld: 137 mg/dL — ABNORMAL HIGH (ref 65–99)
Potassium: 4.8 mmol/L (ref 3.5–5.1)
SODIUM: 133 mmol/L — AB (ref 135–145)

## 2015-10-01 MED ORDER — OXYCODONE-ACETAMINOPHEN 5-325 MG PO TABS
1.0000 | ORAL_TABLET | ORAL | Status: DC | PRN
  Administered 2015-10-01 – 2015-10-03 (×5): 1 via ORAL
  Filled 2015-10-01 (×5): qty 1

## 2015-10-01 NOTE — Progress Notes (Signed)
Patient ID: Rodney Stewart, male   DOB: 25-Feb-1955, 60 y.o.   MRN: [id number]  [facility] PROGRESS NOTE  ELAZAR ARGABRIGHT NLZ:[id number] DOB: 1955/09/13 DOA: 09/30/2015 PCP: No primary care provider on file.  HPI/Subjective: Patient with left scapula pain. Breathing okay. Hx renal cell carcinoma. He has been taking advils 4 a day fow a few days, also some bc powder.  Drinks 4-5 drinks per day  Objective: Vitals:   10/01/15 0459 10/01/15 1219  BP: (!) 147/78 134/77  Pulse: 69 69  Resp: 20 17  Temp: 98.8 F (37.1 C) 99.2 F (37.3 C)    Filed Weights   09/30/15 1059 09/30/15 1607  Weight: 86.2 kg (190 lb) 84.7 kg (186 lb 11.2 oz)    ROS: Review of Systems  Constitutional: Negative for chills and fever.  Eyes: Negative for blurred vision.  Respiratory: Negative for cough and shortness of breath.   Cardiovascular: Positive for chest pain.  Gastrointestinal: Negative for abdominal pain, constipation, diarrhea, nausea and vomiting.  Genitourinary: Negative for dysuria.  Musculoskeletal: Positive for back pain. Negative for joint pain.  Neurological: Negative for dizziness and headaches.   Exam: Physical Exam  Constitutional: He is oriented to person, place, and time.  HENT:  Nose: No mucosal edema.  Mouth/Throat: No oropharyngeal exudate or posterior oropharyngeal edema.  Eyes: Conjunctivae, EOM and lids are normal. Pupils are equal, round, and reactive to light.  Neck: No JVD present. Carotid bruit is not present. No edema present. No thyroid mass and no thyromegaly present.  Cardiovascular: S1 normal and S2 normal.  Exam reveals no gallop.   No murmur heard. Pulses:      Dorsalis pedis pulses are 2+ on the right side, and 2+ on the left side.  Respiratory: No respiratory distress. He has no wheezes. He has no rhonchi. He has no rales.  GI: Soft. Bowel sounds are normal. There is no tenderness.  Musculoskeletal:       Left shoulder: He exhibits normal range of motion and no  tenderness.  Lymphadenopathy:    He has no cervical adenopathy.  Neurological: He is alert and oriented to person, place, and time. No cranial nerve deficit.  Skin: Skin is warm. No rash noted. Nails show no clubbing.  Psychiatric: He has a normal mood and affect.      Data Reviewed: Basic Metabolic Panel:  Recent Labs Lab 09/30/15 1145 10/01/15 0442  NA 128* 133*  K 5.3* 4.8  CL 95* 104  CO2 27 22  GLUCOSE 376* 137*  BUN 25* 28*  CREATININE 3.53* 3.81*  CALCIUM 9.6 8.2*   Liver Function Tests:  Recent Labs Lab 09/30/15 1145  AST 46*  ALT 28  ALKPHOS 231*  BILITOT 1.5*  PROT 7.4  ALBUMIN 3.3*   CBC:  Recent Labs Lab 09/30/15 1145 10/01/15 0442  WBC 3.1* 4.1  NEUTROABS 2.2  --   HGB 10.5* 9.2*  HCT 31.0* 26.8*  MCV 92.3 92.0  PLT 105* 97*   Cardiac Enzymes:  Recent Labs Lab 09/30/15 1145 09/30/15 1631  CKTOTAL 42*  --   TROPONINI <0.03 <0.03    CBG:  Recent Labs Lab 09/30/15 1542 09/30/15 1702 09/30/15 2052 10/01/15 0722 10/01/15 1125  GLUCAP 293* 294* 270* 110* 181*      Studies: Dg Chest 2 View  Result Date: 09/30/2015 CLINICAL DATA:  Chest pain radiating. left shoulder pain. EXAM: CHEST  2 VIEW FINDINGS: Mediastinum is unremarkable. Left hilar mass is noted. Although this could be  vascular, adenopathy or malignancy cannot be excluded. Questionable pulmonary nodule in the right upper lobe. IV contrast-enhanced chest CT suggest for further evaluation these findings. Heart size normal. No pleural effusion or pneumothorax. No acute bony abnormality. IMPRESSION: 1. Left hilar fullness/mass. 2. Questionable right upper lobe pulmonary nodule. IV contrast-enhanced chest CT is suggested for further evaluation of these findings. Electronically Signed   By: Marcello Moores  Register   On: 09/30/2015 11:26   Dg Scapula Left  Result Date: 10/01/2015 CLINICAL DATA:  Left posterior back pain near scapula for 1 week after heavy lifting. EXAM: LEFT SCAPULA -  2+ VIEWS COMPARISON:  CT scan from yesterday FINDINGS: There is no evidence of fracture or other focal bone lesions. Soft tissues are unremarkable. IMPRESSION: Negative. Electronically Signed   By: Dorise Bullion III M.D   On: 10/01/2015 13:56   Ct Chest Wo Contrast  Result Date: 09/30/2015 CLINICAL DATA:  60 year old male with history of renal cancer presents for evaluation of a questionable left hilar mass on chest radiograph from earlier today. EXAM: CT CHEST WITHOUT CONTRAST TECHNIQUE: Multidetector CT imaging of the chest was performed following the standard protocol without IV contrast. COMPARISON:  Chest radiograph from earlier today. No prior chest CT. 05/09/2010 CT abdomen/pelvis. FINDINGS: Cardiovascular: Normal heart size. No significant pericardial fluid/thickening. Left main, left anterior descending, left circumflex and right coronary atherosclerosis. Atherosclerotic nonaneurysmal thoracic aorta. Normal caliber pulmonary arteries. Mediastinum/Nodes: No discrete thyroid nodules. Unremarkable esophagus. No axillary adenopathy. Bulky 4.2 cm subcarinal node (series 2/ image 73). Enlarged 1.8 cm left hilar node (series 2/ image 71). Enlarged 1.5 cm right hilar node (series 2/ image 69). No additional pathologically enlarged mediastinal or gross hilar nodes on this noncontrast study. Lungs/Pleura: No pneumothorax. No pleural effusion. There are greater than 20 solid pulmonary nodules of various size randomly distributed throughout both lungs involving all lung lobes, largest 2.4 cm in the left lower lobe (series 4/ image 98), 1.7 cm in the right upper lobe (series 4/ image 47) and 0.8 cm in the left upper lobe (series 4/ image 35). No acute consolidative airspace disease. Upper abdomen: Liver surface is diffusely irregular indicating cirrhosis. Small volume perihepatic ascites. Cholelithiasis. Splenomegaly. Musculoskeletal: No aggressive appearing focal osseous lesions. Mild thoracic spondylosis.  IMPRESSION: 1. Bulky subcarinal and mild bilateral hilar lymphadenopathy, most consistent with metastatic disease given the reported history of renal cancer. 2. Innumerable (> than 20) pulmonary nodules randomly distributed throughout both lungs measuring up to 2.4 cm in the left lower lobe, most consistent with pulmonary metastases given the reported history of renal cancer . 3. Cirrhosis.  Small volume perihepatic ascites.  Splenomegaly. 4. Additional findings include aortic atherosclerosis, left main and 3 vessel coronary atherosclerosis and cholelithiasis. Electronically Signed   By: Ilona Sorrel M.D.   On: 09/30/2015 13:09   US Renal  Result Date: 09/30/2015 CLINICAL DATA:  Lung mass status post renal cancer EXAM: RENAL / URINARY TRACT ULTRASOUND COMPLETE COMPARISON:  CT abdomen 05/09/2010 FINDINGS: Right Kidney: Length: 11.4 cm. Echogenicity within normal limits. No mass or hydronephrosis visualized. Left Kidney: Surgically absent. Bladder: Appears normal for degree of bladder distention. Other:  Splenic enlargement measuring 910 mL. IMPRESSION: 1. Normal right kidney. 2. Surgically absent left kidney. 3. Splenomegaly. Electronically Signed   By: Kathreen Devoid   On: 09/30/2015 13:32    Scheduled Meds: . folic acid  1 mg Oral Daily  . gabapentin  100 mg Oral TID  . heparin  5,000 Units Subcutaneous Q8H  . insulin  aspart  0-5 Units Subcutaneous QHS  . insulin aspart  0-9 Units Subcutaneous TID WC  . insulin detemir  48 Units Subcutaneous Q2200  . multivitamin with minerals  1 tablet Oral Daily  . nebivolol  20 mg Oral Daily  . pravastatin  20 mg Oral q1800  . sodium chloride flush  3 mL Intravenous Q12H  . tamsulosin  0.4 mg Oral Daily  . thiamine  100 mg Oral Daily   Continuous Infusions: . sodium chloride 300 mL (10/01/15 1320)    Assessment/Plan:  1. Acute kidney injury. Patient history of one kidney secondary to renal carcinoma surgery. Continue IV fluids. Renal sonogram negative.  Probably worsened from Advil and BC powder. Hold Cozaar. 2. Abnormal CT scan of the chest with likely metastatic disease. Stop aspirin. Likely will end up needing a biopsy at some point. History of renal cell carcinoma this could be metastases. 3. Alcohol abuse. Start thiamine. Monitor for withdrawal. 4. Cirrhosis seen on CT scan of the chest. Advised to stop drinking alcohol. 5. Type 2 diabetes mellitus on detemir insulin and sliding scale 6. Hyperlipidemia unspecified on pravastatin 7. BPH on Flomax  Code Status:     Code Status Orders        Start     Ordered   09/30/15 1610  Full code  Continuous     09/30/15 1609    Code Status History    Date Active Date Inactive Code Status Order ID Comments User Context   This patient has a current code status but no historical code status.     Family Communication: Children at the bedside Disposition Plan: Home once kidney function improves  Consultants:  Nephrology  Oncology  Time spent:  30 minutes Loletha Grayer  [employer]

## 2015-10-01 NOTE — Consult Note (Addendum)
[facility] CONSULT NOTE    Date: 10/01/2015                  Patient Name:  Rodney Stewart  MRN: [id number]  DOB: May 25, 1955  Age / Sex: 60 y.o., male         PCP: No primary care provider on file.                 Service Requesting Consult: Hospitalist                 Reason for Consult: Acute renal failure/CKD stage III in the setting of recurrent renal cell carcinoma            History of Present Illness: Patient is a 60 y.o. male with a PMHx of BPH, CKD stage III baseline Cr 1.5, Renal cell carcinoma status post left nephrectomy 05/2012, diabetes mellitus type 2, hypertension who was admitted to [facility] on 09/30/2015 for evaluation of shoulder blade pain as well as cough. Patient had CT scan of the chest which revealed multiple bilateral pulmonary lesions as well as mediastinal lymphadenopathy which was concerning for recurrent metastatic disease from his renal cell carcinoma. He was also noted as having acute renal failure. Creatinine currently 3.8. The patient's baseline creatinine is 1.5 from 01/28/2015. He has been evaluated by Dr. Grayland Ormond. Further plan to be determined in regards to his underlying metastatic disease. The patient reports to [place] that he has been taking NSAIDs on a daily basis including BC powder. He's also had poor by mouth intake over the past several days.   Medications: Outpatient medications: Prescriptions Prior to Admission  Medication Sig Dispense Refill Last Dose  . colchicine 0.6 MG tablet Take 0.6 mg by mouth daily.    09/29/2015 at 0900  . gabapentin (NEURONTIN) 100 MG capsule Take 100 mg by mouth 3 (three) times daily.    09/29/2015 at 2100  . LEVEMIR FLEXTOUCH 100 UNIT/ML Pen Inject 48 Units into the skin daily at 10 pm.    09/29/2015 at Unknown time  . losartan (COZAAR) 100 MG tablet Take 100 mg by mouth daily.    09/29/2015 at 0900  . lovastatin (MEVACOR) 20 MG tablet Take 20 mg by mouth daily at 6 PM.    09/29/2015 at 1800  . Nebivolol  HCl 20 MG TABS Take 20 mg by mouth daily.   09/29/2015 at 0900  . NOVOLOG 100 UNIT/ML injection Inject 12 Units into the skin daily at 12 noon. At noon.   09/29/2015 at 1200  . tamsulosin (FLOMAX) 0.4 MG CAPS capsule Take 0.4 mg by mouth daily.    09/29/2015 at 0900    Current medications: Current Facility-Administered Medications  Medication Dose Route Frequency Provider Last Rate Last Dose  . 0.9 %  sodium chloride infusion   Intravenous Continuous Loletha Grayer, MD 75 mL/hr at 10/01/15 1320 300 mL at 10/01/15 1320  . acetaminophen (TYLENOL) tablet 650 mg  650 mg Oral Q6H PRN Demetrios Loll, MD       Or  . acetaminophen (TYLENOL) suppository 650 mg  650 mg Rectal Q6H PRN Demetrios Loll, MD      . albuterol (PROVENTIL) (2.5 MG/3ML) 0.083% nebulizer solution 2.5 mg  2.5 mg Nebulization Q2H PRN Demetrios Loll, MD      . fentaNYL (SUBLIMAZE) injection 100 mcg  100 mcg Intravenous Q1H PRN Merlyn Lot, MD   100 mcg at 10/01/15 0753  . folic acid (FOLVITE) tablet 1 mg  1 mg Oral Daily Demetrios Loll, MD   1 mg at 10/01/15 1012  . gabapentin (NEURONTIN) capsule 100 mg  100 mg Oral TID Demetrios Loll, MD   100 mg at 10/01/15 1011  . heparin injection 5,000 Units  5,000 Units Subcutaneous Q8H Demetrios Loll, MD   5,000 Units at 10/01/15 1412  . insulin aspart (novoLOG) injection 0-5 Units  0-5 Units Subcutaneous QHS Demetrios Loll, MD   3 Units at 09/30/15 2105  . insulin aspart (novoLOG) injection 0-9 Units  0-9 Units Subcutaneous TID WC Demetrios Loll, MD   2 Units at 10/01/15 1321  . insulin detemir (LEVEMIR) injection 48 Units  48 Units Subcutaneous Q2200 Demetrios Loll, MD   48 Units at 09/30/15 2200  . LORazepam (ATIVAN) tablet 1 mg  1 mg Oral Q6H PRN Demetrios Loll, MD   1 mg at 10/01/15 0004   Or  . LORazepam (ATIVAN) injection 1 mg  1 mg Intravenous Q6H PRN Demetrios Loll, MD      . multivitamin with minerals tablet 1 tablet  1 tablet Oral Daily Demetrios Loll, MD   1 tablet at 10/01/15 1011  . nebivolol (BYSTOLIC) tablet 20 mg  20 mg Oral Daily  Demetrios Loll, MD   20 mg at 10/01/15 1012  . ondansetron (ZOFRAN) tablet 4 mg  4 mg Oral Q6H PRN Demetrios Loll, MD       Or  . ondansetron [facility]) injection 4 mg  4 mg Intravenous Q6H PRN Demetrios Loll, MD   4 mg at 09/30/15 1747  . oxyCODONE-acetaminophen (PERCOCET/ROXICET) 5-325 MG per tablet 1 tablet  1 tablet Oral Q4H PRN Loletha Grayer, MD   1 tablet at 10/01/15 1437  . pravastatin (PRAVACHOL) tablet 20 mg  20 mg Oral q1800 Demetrios Loll, MD   20 mg at 09/30/15 1723  . sodium chloride flush (NS) 0.9 % injection 3 mL  3 mL Intravenous Q12H Demetrios Loll, MD   3 mL at 10/01/15 1016  . tamsulosin (FLOMAX) capsule 0.4 mg  0.4 mg Oral Daily Demetrios Loll, MD   0.4 mg at 10/01/15 1012  . thiamine (VITAMIN B-1) tablet 100 mg  100 mg Oral Daily Demetrios Loll, MD   100 mg at 10/01/15 1012   Or  . thiamine (B-1) injection 100 mg  100 mg Intravenous Daily Demetrios Loll, MD          Allergies: No Known Allergies    Past Medical History: Past Medical History:  Diagnosis Date  . BPH (benign prostatic hyperplasia)   . CKD (chronic kidney disease) stage 3, GFR 30-59 ml/min   . Clear cell renal cell carcinoma (HCC)   . Diabetes mellitus without complication ([place])   . History of nephrectomy   . Hypertension      Past Surgical History: Past Surgical History:  Procedure Laterality Date  . KNEE SURGERY    . NEPHRECTOMY       Family History: Family History  Problem Relation Age of Onset  . Cancer Mother   . Diabetes Father   . Hypertension Father      Social History: Social History   Social History  . Marital status: Divorced    Spouse name: N/A  . Number of children: N/A  . Years of education: N/A   Occupational History  . Not on file.   Social History Main Topics  . Smoking status: Never Smoker  . Smokeless tobacco: Never Used  . Alcohol use 25.2 oz/week    42 Cans  of beer per week     Comment: 5-6 cans a day  . Drug use: Unknown  . Sexual activity: Not on file   Other Topics Concern  . Not  on file   Social History Narrative  . No narrative on file     Review of Systems: Review of Systems  Constitutional: Positive for malaise/fatigue. Negative for chills, fever and weight loss.  HENT: Negative for hearing loss and tinnitus.   Eyes: Positive for photophobia. Negative for blurred vision and double vision.  Respiratory: Positive for cough and shortness of breath.   Cardiovascular: Negative for chest pain, palpitations, orthopnea and claudication.  Gastrointestinal: Positive for nausea. Negative for abdominal pain and heartburn.  Genitourinary: Positive for urgency. Negative for dysuria.  Musculoskeletal: Positive for back pain and myalgias.  Neurological: Negative for dizziness, focal weakness and headaches.  Psychiatric/Behavioral: Positive for depression. The patient is not nervous/anxious.      Vital Signs: Blood pressure 134/77, pulse 69, temperature 99.2 F (37.3 C), temperature source Oral, resp. rate 17, height '5\' 11"'$  (1.803 m), weight 84.7 kg (186 lb 11.2 oz), SpO2 99 %.  Weight trends: Filed Weights   09/30/15 1059 09/30/15 1607  Weight: 86.2 kg (190 lb) 84.7 kg (186 lb 11.2 oz)    Physical Exam: General: NAD, resting in bed  Head: Normocephalic, atraumatic.  Eyes: Anicteric, EOMI  Nose: Mucous membranes moist, not inflammed, nonerythematous.  Throat: Oropharynx nonerythematous, no exudate appreciated.   Neck: Supple, trachea midline.  Lungs:  Normal respiratory effort. Clear to auscultation BL without crackles or wheezes.  Heart: RRR. S1 and S2 normal without gallop, murmur, or rubs.  Abdomen:  BS normoactive. Soft, Nondistended, non-tender.  No masses or organomegaly.  Extremities: No pretibial edema.  Neurologic: A&O X3, Motor strength is 5/5 in the all 4 extremities  Skin: No visible rashes, scars.    Lab results: Basic Metabolic Panel:  Recent Labs Lab 09/30/15 1145 10/01/15 0442  NA 128* 133*  K 5.3* 4.8  CL 95* 104  CO2 27 22   GLUCOSE 376* 137*  BUN 25* 28*  CREATININE 3.53* 3.81*  CALCIUM 9.6 8.2*    Liver Function Tests:  Recent Labs Lab 09/30/15 1145  AST 46*  ALT 28  ALKPHOS 231*  BILITOT 1.5*  PROT 7.4  ALBUMIN 3.3*   No results for input(s): LIPASE, AMYLASE in the last 168 hours. No results for input(s): AMMONIA in the last 168 hours.  CBC:  Recent Labs Lab 09/30/15 1145 10/01/15 0442  WBC 3.1* 4.1  NEUTROABS 2.2  --   HGB 10.5* 9.2*  HCT 31.0* 26.8*  MCV 92.3 92.0  PLT 105* 97*    Cardiac Enzymes:  Recent Labs Lab 09/30/15 1145 09/30/15 1631  CKTOTAL 42*  --   TROPONINI <0.03 <0.03    BNP: Invalid input(s): POCBNP  CBG:  Recent Labs Lab 09/30/15 1542 09/30/15 1702 09/30/15 2052 10/01/15 0722 10/01/15 1125  GLUCAP 293* 294* 270* 110* 181*    Microbiology: Results for orders placed or performed in visit on 04/26/12  Urine culture     Status: None   Collection Time: 04/26/12  4:20 PM  Result Value Ref Range Status   Micro Text Report   Final       SOURCE: INDWELLING CATHETER    COMMENT                   NO GROWTH IN 36 HOURS   ANTIBIOTIC  Coagulation Studies:  Recent Labs  09/30/15 1145  LABPROT 14.8  INR 1.15    Urinalysis: No results for input(s): COLORURINE, LABSPEC, PHURINE, GLUCOSEU, HGBUR, BILIRUBINUR, KETONESUR, PROTEINUR, UROBILINOGEN, NITRITE, LEUKOCYTESUR in the last 72 hours.  Invalid input(s): APPERANCEUR    Imaging: Dg Chest 2 View  Result Date: 09/30/2015 CLINICAL DATA:  Chest pain radiating. left shoulder pain. EXAM: CHEST  2 VIEW FINDINGS: Mediastinum is unremarkable. Left hilar mass is noted. Although this could be vascular, adenopathy or malignancy cannot be excluded. Questionable pulmonary nodule in the right upper lobe. IV contrast-enhanced chest CT suggest for further evaluation these findings. Heart size normal. No pleural effusion or pneumothorax. No acute bony  abnormality. IMPRESSION: 1. Left hilar fullness/mass. 2. Questionable right upper lobe pulmonary nodule. IV contrast-enhanced chest CT is suggested for further evaluation of these findings. Electronically Signed   By: Marcello Moores  Register   On: 09/30/2015 11:26   Dg Scapula Left  Result Date: 10/01/2015 CLINICAL DATA:  Left posterior back pain near scapula for 1 week after heavy lifting. EXAM: LEFT SCAPULA - 2+ VIEWS COMPARISON:  CT scan from yesterday FINDINGS: There is no evidence of fracture or other focal bone lesions. Soft tissues are unremarkable. IMPRESSION: Negative. Electronically Signed   By: Dorise Bullion III M.D   On: 10/01/2015 13:56   Ct Chest Wo Contrast  Result Date: 09/30/2015 CLINICAL DATA:  60 year old male with history of renal cancer presents for evaluation of a questionable left hilar mass on chest radiograph from earlier today. EXAM: CT CHEST WITHOUT CONTRAST TECHNIQUE: Multidetector CT imaging of the chest was performed following the standard protocol without IV contrast. COMPARISON:  Chest radiograph from earlier today. No prior chest CT. 05/09/2010 CT abdomen/pelvis. FINDINGS: Cardiovascular: Normal heart size. No significant pericardial fluid/thickening. Left main, left anterior descending, left circumflex and right coronary atherosclerosis. Atherosclerotic nonaneurysmal thoracic aorta. Normal caliber pulmonary arteries. Mediastinum/Nodes: No discrete thyroid nodules. Unremarkable esophagus. No axillary adenopathy. Bulky 4.2 cm subcarinal node (series 2/ image 73). Enlarged 1.8 cm left hilar node (series 2/ image 71). Enlarged 1.5 cm right hilar node (series 2/ image 69). No additional pathologically enlarged mediastinal or gross hilar nodes on this noncontrast study. Lungs/Pleura: No pneumothorax. No pleural effusion. There are greater than 20 solid pulmonary nodules of various size randomly distributed throughout both lungs involving all lung lobes, largest 2.4 cm in the left lower  lobe (series 4/ image 98), 1.7 cm in the right upper lobe (series 4/ image 47) and 0.8 cm in the left upper lobe (series 4/ image 35). No acute consolidative airspace disease. Upper abdomen: Liver surface is diffusely irregular indicating cirrhosis. Small volume perihepatic ascites. Cholelithiasis. Splenomegaly. Musculoskeletal: No aggressive appearing focal osseous lesions. Mild thoracic spondylosis. IMPRESSION: 1. Bulky subcarinal and mild bilateral hilar lymphadenopathy, most consistent with metastatic disease given the reported history of renal cancer. 2. Innumerable (> than 20) pulmonary nodules randomly distributed throughout both lungs measuring up to 2.4 cm in the left lower lobe, most consistent with pulmonary metastases given the reported history of renal cancer . 3. Cirrhosis.  Small volume perihepatic ascites.  Splenomegaly. 4. Additional findings include aortic atherosclerosis, left main and 3 vessel coronary atherosclerosis and cholelithiasis. Electronically Signed   By: Ilona Sorrel M.D.   On: 09/30/2015 13:09   US Renal  Result Date: 09/30/2015 CLINICAL DATA:  Lung mass status post renal cancer EXAM: RENAL / URINARY TRACT ULTRASOUND COMPLETE COMPARISON:  CT abdomen 05/09/2010 FINDINGS: Right Kidney: Length: 11.4 cm. Echogenicity within normal limits.  No mass or hydronephrosis visualized. Left Kidney: Surgically absent. Bladder: Appears normal for degree of bladder distention. Other:  Splenic enlargement measuring 910 mL. IMPRESSION: 1. Normal right kidney. 2. Surgically absent left kidney. 3. Splenomegaly. Electronically Signed   By: Kathreen Devoid   On: 09/30/2015 13:32      Assessment & Plan: Pt is a 60 y.o. male with a PMHx of BPH, CKD stage III baseline Cr 1.5, Renal cell carcinoma status post left nephrectomy 05/2012, diabetes mellitus type 2, hypertension who was admitted to [facility] on 09/30/2015 for evaluation of shoulder blade pain as well as cough. Patient had CT scan of the chest which  revealed multiple bilateral pulmonary lesions as well as mediastinal lymphadenopathy which was concerning for recurrent metastatic disease from his renal cell carcinoma.  1. Acute renal failure/chronic kidney disease stage III. Baseline creatinine 1.5 01/28/2015. Renal function significantly worse now. Patient has been taking BC powders at home and has had poor by mouth intake.  Renal ultrasound showed a normal right kidney. There was a surgically absent left kidney.  Continue IV fluid hydration for now. Follow renal function trend. Hold any further NSAIDs.  2. Anemia of chronic kidney disease. Hemoglobin currently 9.2 with platelets of 97,000. We will check SPEP and UPEP.  3. Hyponatremia. This could potentially be related to the pulmonary masses seen and patient may have mild underlying SIADH. However since sodium did rise over the past week 4 hours continue IV fluid hydration.  4. Recurrent renal cell carcinoma. The patient has multiple pulmonary nodules, mediastinal lymphadenopathy. Appreciate input from Dr. Grayland Ormond. Further plan as patient progresses.

## 2015-10-01 NOTE — Consult Note (Signed)
[place]  [phone] [phone]  ID: Rodney Stewart OB: 23-Mar-1955  MR#: [id number]  YQM#:[id number]  No care team member to display  CHIEF COMPLAINT: History of renal cell carcinoma, now with likely metastatic disease.  INTERVAL HISTORY: Patient is a 60 year old male with a history of renal cell carcinoma status post nephrectomy in May 2014. He recently presented to the emergency room with "shoulder blade pain and acute bronchitis. Upon valuation emergency room, he was noted to have acute renal failure with a creatinine greater than 3.5. CT scan of the chest revealed multiple bilateral pulmonary lesions as well as mediastinal lymphadenopathy highly concerning for recurrent metastatic disease. Currently, patient feels improved from admission. He has no neurologic complaints. He denies any fevers. He has a fair appetite, but denies weight loss. He denies any shortness of breath or hemoptysis. He has no nausea, vomiting, constipation, or diarrhea. He has no melena or hematochezia. He has no urinary complaints. Patient otherwise feels well and offers no further specific complaints.   REVIEW OF SYSTEMS:   Review of Systems  Constitutional: Negative.  Negative for fever, malaise/fatigue and weight loss.  Respiratory: Positive for cough. Negative for hemoptysis and shortness of breath.   Cardiovascular: Positive for chest pain. Negative for leg swelling.  Gastrointestinal: Negative.  Negative for abdominal pain.  Genitourinary: Negative.  Negative for flank pain.  Musculoskeletal: Negative.   Neurological: Negative.  Negative for weakness.  Psychiatric/Behavioral: Negative.  The patient is not nervous/anxious.     As per HPI. Otherwise, a complete review of systems is negative.  PAST MEDICAL HISTORY: Past Medical History:  Diagnosis Date  . BPH (benign prostatic hyperplasia)   . CKD (chronic kidney disease) stage 3, GFR 30-59 ml/min   . Clear cell  renal cell carcinoma (HCC)   . Diabetes mellitus without complication ([place])   . History of nephrectomy   . Hypertension     PAST SURGICAL HISTORY: Past Surgical History:  Procedure Laterality Date  . KNEE SURGERY    . NEPHRECTOMY      FAMILY HISTORY: Family History  Problem Relation Age of Onset  . Cancer Mother   . Diabetes Father   . Hypertension Father     ADVANCED DIRECTIVES (Y/N):  '@ADVDIR'$ @  HEALTH MAINTENANCE: Social History  Substance Use Topics  . Smoking status: Never Smoker  . Smokeless tobacco: Never Used  . Alcohol use 25.2 oz/week    42 Cans of beer per week     Comment: 5-6 cans a day     Colonoscopy:  PAP:  Bone density:  Lipid panel:  No Known Allergies  Current Facility-Administered Medications  Medication Dose Route Frequency Provider Last Rate Last Dose  . 0.9 %  sodium chloride infusion   Intravenous Continuous Demetrios Loll, MD 125 mL/hr at 10/01/15 0754 1,000 mL at 10/01/15 0754  . acetaminophen (TYLENOL) tablet 650 mg  650 mg Oral Q6H PRN Demetrios Loll, MD       Or  . acetaminophen (TYLENOL) suppository 650 mg  650 mg Rectal Q6H PRN Demetrios Loll, MD      . albuterol (PROVENTIL) (2.5 MG/3ML) 0.083% nebulizer solution 2.5 mg  2.5 mg Nebulization Q2H PRN Demetrios Loll, MD      . aspirin tablet 325 mg  325 mg Oral Daily Demetrios Loll, MD   325 mg at 10/01/15 1011  . fentaNYL (SUBLIMAZE) injection 100 mcg  100 mcg Intravenous Q1H PRN Merlyn Lot, MD   100 mcg at 10/01/15 0753  .  folic acid (FOLVITE) tablet 1 mg  1 mg Oral Daily Demetrios Loll, MD   1 mg at 10/01/15 1012  . gabapentin (NEURONTIN) capsule 100 mg  100 mg Oral TID Demetrios Loll, MD   100 mg at 10/01/15 1011  . heparin injection 5,000 Units  5,000 Units Subcutaneous Q8H Demetrios Loll, MD   5,000 Units at 10/01/15 0600  . insulin aspart (novoLOG) injection 0-5 Units  0-5 Units Subcutaneous QHS Demetrios Loll, MD   3 Units at 09/30/15 2105  . insulin aspart (novoLOG) injection 0-9 Units  0-9 Units Subcutaneous TID WC  Demetrios Loll, MD   5 Units at 09/30/15 1722  . insulin detemir (LEVEMIR) injection 48 Units  48 Units Subcutaneous Q2200 Demetrios Loll, MD   48 Units at 09/30/15 2200  . LORazepam (ATIVAN) tablet 1 mg  1 mg Oral Q6H PRN Demetrios Loll, MD   1 mg at 10/01/15 0004   Or  . LORazepam (ATIVAN) injection 1 mg  1 mg Intravenous Q6H PRN Demetrios Loll, MD      . multivitamin with minerals tablet 1 tablet  1 tablet Oral Daily Demetrios Loll, MD   1 tablet at 10/01/15 1011  . nebivolol (BYSTOLIC) tablet 20 mg  20 mg Oral Daily Demetrios Loll, MD   20 mg at 10/01/15 1012  . ondansetron (ZOFRAN) tablet 4 mg  4 mg Oral Q6H PRN Demetrios Loll, MD       Or  . ondansetron [facility]) injection 4 mg  4 mg Intravenous Q6H PRN Demetrios Loll, MD   4 mg at 09/30/15 1747  . pravastatin (PRAVACHOL) tablet 20 mg  20 mg Oral q1800 Demetrios Loll, MD   20 mg at 09/30/15 1723  . sodium chloride flush (NS) 0.9 % injection 3 mL  3 mL Intravenous Q12H Demetrios Loll, MD   3 mL at 10/01/15 1016  . tamsulosin (FLOMAX) capsule 0.4 mg  0.4 mg Oral Daily Demetrios Loll, MD   0.4 mg at 10/01/15 1012  . thiamine (VITAMIN B-1) tablet 100 mg  100 mg Oral Daily Demetrios Loll, MD   100 mg at 10/01/15 1012   Or  . thiamine (B-1) injection 100 mg  100 mg Intravenous Daily Demetrios Loll, MD        OBJECTIVE: Vitals:   10/01/15 0009 10/01/15 0459  BP: (!) 143/76 (!) 147/78  Pulse: 65 69  Resp: 20 20  Temp: 98.4 F (36.9 C) 98.8 F (37.1 C)     Body mass index is 26.04 kg/m.    ECOG FS:0 - Asymptomatic  General: Well-developed, well-nourished, no acute distress. Eyes: Pink conjunctiva, anicteric sclera. HEENT: Normocephalic, moist mucous membranes, clear oropharnyx. Lungs: Clear to auscultation bilaterally. Heart: Regular rate and rhythm. No rubs, murmurs, or gallops. Abdomen: Soft, nontender, nondistended. No organomegaly noted, normoactive bowel sounds. Musculoskeletal: No edema, cyanosis, or clubbing. Neuro: Alert, answering all questions appropriately. Cranial nerves grossly  intact. Skin: No rashes or petechiae noted. Psych: Normal affect. Lymphatics: No cervical, calvicular, axillary or inguinal LAD.   LAB RESULTS:  Lab Results  Component Value Date   NA 133 (L) 10/01/2015   K 4.8 10/01/2015   CL 104 10/01/2015   CO2 22 10/01/2015   GLUCOSE 137 (H) 10/01/2015   BUN 28 (H) 10/01/2015   CREATININE 3.81 (H) 10/01/2015   CALCIUM 8.2 (L) 10/01/2015   PROT 7.4 09/30/2015   ALBUMIN 3.3 (L) 09/30/2015   AST 46 (H) 09/30/2015   ALT 28 09/30/2015   ALKPHOS 231 (H)  09/30/2015   BILITOT 1.5 (H) 09/30/2015   GFRNONAA 16 (L) 10/01/2015   GFRAA 19 (L) 10/01/2015    Lab Results  Component Value Date   WBC 4.1 10/01/2015   NEUTROABS 2.2 09/30/2015   HGB 9.2 (L) 10/01/2015   HCT 26.8 (L) 10/01/2015   MCV 92.0 10/01/2015   PLT 97 (L) 10/01/2015     STUDIES: Dg Chest 2 View  Result Date: 09/30/2015 CLINICAL DATA:  Chest pain radiating. left shoulder pain. EXAM: CHEST  2 VIEW FINDINGS: Mediastinum is unremarkable. Left hilar mass is noted. Although this could be vascular, adenopathy or malignancy cannot be excluded. Questionable pulmonary nodule in the right upper lobe. IV contrast-enhanced chest CT suggest for further evaluation these findings. Heart size normal. No pleural effusion or pneumothorax. No acute bony abnormality. IMPRESSION: 1. Left hilar fullness/mass. 2. Questionable right upper lobe pulmonary nodule. IV contrast-enhanced chest CT is suggested for further evaluation of these findings. Electronically Signed   By: Marcello Moores  Register   On: 09/30/2015 11:26   Ct Chest Wo Contrast  Result Date: 09/30/2015 CLINICAL DATA:  60 year old male with history of renal cancer presents for evaluation of a questionable left hilar mass on chest radiograph from earlier today. EXAM: CT CHEST WITHOUT CONTRAST TECHNIQUE: Multidetector CT imaging of the chest was performed following the standard protocol without IV contrast. COMPARISON:  Chest radiograph from earlier  today. No prior chest CT. 05/09/2010 CT abdomen/pelvis. FINDINGS: Cardiovascular: Normal heart size. No significant pericardial fluid/thickening. Left main, left anterior descending, left circumflex and right coronary atherosclerosis. Atherosclerotic nonaneurysmal thoracic aorta. Normal caliber pulmonary arteries. Mediastinum/Nodes: No discrete thyroid nodules. Unremarkable esophagus. No axillary adenopathy. Bulky 4.2 cm subcarinal node (series 2/ image 73). Enlarged 1.8 cm left hilar node (series 2/ image 71). Enlarged 1.5 cm right hilar node (series 2/ image 69). No additional pathologically enlarged mediastinal or gross hilar nodes on this noncontrast study. Lungs/Pleura: No pneumothorax. No pleural effusion. There are greater than 20 solid pulmonary nodules of various size randomly distributed throughout both lungs involving all lung lobes, largest 2.4 cm in the left lower lobe (series 4/ image 98), 1.7 cm in the right upper lobe (series 4/ image 47) and 0.8 cm in the left upper lobe (series 4/ image 35). No acute consolidative airspace disease. Upper abdomen: Liver surface is diffusely irregular indicating cirrhosis. Small volume perihepatic ascites. Cholelithiasis. Splenomegaly. Musculoskeletal: No aggressive appearing focal osseous lesions. Mild thoracic spondylosis. IMPRESSION: 1. Bulky subcarinal and mild bilateral hilar lymphadenopathy, most consistent with metastatic disease given the reported history of renal cancer. 2. Innumerable (> than 20) pulmonary nodules randomly distributed throughout both lungs measuring up to 2.4 cm in the left lower lobe, most consistent with pulmonary metastases given the reported history of renal cancer . 3. Cirrhosis.  Small volume perihepatic ascites.  Splenomegaly. 4. Additional findings include aortic atherosclerosis, left main and 3 vessel coronary atherosclerosis and cholelithiasis. Electronically Signed   By: Ilona Sorrel M.D.   On: 09/30/2015 13:09   US  Renal  Result Date: 09/30/2015 CLINICAL DATA:  Lung mass status post renal cancer EXAM: RENAL / URINARY TRACT ULTRASOUND COMPLETE COMPARISON:  CT abdomen 05/09/2010 FINDINGS: Right Kidney: Length: 11.4 cm. Echogenicity within normal limits. No mass or hydronephrosis visualized. Left Kidney: Surgically absent. Bladder: Appears normal for degree of bladder distention. Other:  Splenic enlargement measuring 910 mL. IMPRESSION: 1. Normal right kidney. 2. Surgically absent left kidney. 3. Splenomegaly. Electronically Signed   By: Kathreen Devoid   On: 09/30/2015 13:32  ASSESSMENT: History of renal cell carcinoma, now with likely metastatic disease.  PLAN:    1. Renal cell carcinoma: CT scan results reviewed independently and reported as above highly suspicious for metastatic disease. Patient underwent left nephrectomy on May 16, 2012 which revealed a clear cell grade 2 renal cell carcinoma, stage TIIIa, N0, M0. Tumor size of 16 cm. Patient was noted to have renal vein involvement, but no other structures were involved. 0 of 2 lymph nodes were negative for disease. Patient will require a CT of the abdomen and pelvis to complete the staging workup. No further intervention is needed at this time. Patient will require follow-up in the [place] within 1 week of discharge for further evaluation and treatment planning. 2. Acute renal failure: Patient's creatinine remains elevated. Recommend nephrology consult. 3. Anemia: Likely multifactorial, monitor. 4. Thrombocytopenia: Unclear patient's baseline, we will continue to monitor.  Appreciate consult, will follow.  Patient expressed understanding and was in agreement with this plan. He also understands that He can call clinic at any time with any questions, concerns, or complaints.   No matching staging information was found for the patient.  Lloyd Huger, MD   10/01/2015 11:50 AM

## 2015-10-02 ENCOUNTER — Inpatient Hospital Stay: Payer: Medicare HMO

## 2015-10-02 DIAGNOSIS — R590 Localized enlarged lymph nodes: Secondary | ICD-10-CM

## 2015-10-02 DIAGNOSIS — R911 Solitary pulmonary nodule: Secondary | ICD-10-CM

## 2015-10-02 LAB — GLUCOSE, CAPILLARY
GLUCOSE-CAPILLARY: 96 mg/dL (ref 65–99)
GLUCOSE-CAPILLARY: 205 mg/dL — AB (ref 65–99)
Glucose-Capillary: 142 mg/dL — ABNORMAL HIGH (ref 65–99)
Glucose-Capillary: 105 mg/dL — ABNORMAL HIGH (ref 65–99)

## 2015-10-02 LAB — BASIC METABOLIC PANEL
ANION GAP: 5 (ref 5–15)
BUN: 27 mg/dL — AB (ref 6–20)
CALCIUM: 8.3 mg/dL — AB (ref 8.9–10.3)
CO2: 25 mmol/L (ref 22–32)
Chloride: 105 mmol/L (ref 101–111)
Creatinine, Ser: 3.16 mg/dL — ABNORMAL HIGH (ref 0.61–1.24)
GFR calc Af Amer: 23 mL/min — ABNORMAL LOW (ref >60)
GFR, EST NON AFRICAN AMERICAN: 20 mL/min — AB (ref >60)
Glucose, Bld: 84 mg/dL (ref 65–99)
POTASSIUM: 5.1 mmol/L (ref 3.5–5.1)
SODIUM: 135 mmol/L (ref 135–145)

## 2015-10-02 LAB — HEMOGLOBIN A1C
Hgb A1c MFr Bld: 8.2 % — ABNORMAL HIGH (ref 4.8–5.6)
Mean Plasma Glucose: 189 mg/dL

## 2015-10-02 MED ORDER — BUDESONIDE 0.25 MG/2ML IN SUSP
0.2500 mg | Freq: Two times a day (BID) | RESPIRATORY_TRACT | Status: DC
  Administered 2015-10-02 – 2015-10-03 (×3): 0.25 mg via RESPIRATORY_TRACT
  Filled 2015-10-02 (×3): qty 2

## 2015-10-02 MED ORDER — IOPAMIDOL (ISOVUE-300) INJECTION 61%
15.0000 mL | INTRAVENOUS | Status: AC
  Administered 2015-10-02: 30 mL via ORAL

## 2015-10-02 MED ORDER — IPRATROPIUM-ALBUTEROL 0.5-2.5 (3) MG/3ML IN SOLN
3.0000 mL | Freq: Four times a day (QID) | RESPIRATORY_TRACT | Status: DC
  Administered 2015-10-02 – 2015-10-03 (×3): 3 mL via RESPIRATORY_TRACT
  Filled 2015-10-02 (×4): qty 3

## 2015-10-02 NOTE — Consult Note (Signed)
  Psychiatry: Consult received. I came by to see the patient that he had several family members visiting and also said he was feeling sleepy from medication. He preferred that I come by tomorrow. Did not appear to be an immediate emergency. I will check by tomorrow to complete consult.

## 2015-10-02 NOTE — Care Management Important Message (Signed)
Important Message  Patient Details  Name: KINNEY SACKMANN MRN: [id number] Date of Birth: [birth year]   Medicare Important Message Given:  Yes    Kalinda Romaniello A, RN 10/02/2015, 4:05 PM

## 2015-10-02 NOTE — Consult Note (Addendum)
[facility] Consultation      Assessment and Plan:  A: --Renal cell cancer s/p left nephrectomy in 2016 --Mediastinal lymphadenopathy, suspicious for malignancy.  --Multiple lung nodules suspicious for metastatic disease in setting of previous renal cell cancer.  --AKI on CKD.  --Alcohol abuse with fatty liver and cirrhosis.   P: Will plan for bronchoscopy. As patient is stable this could be done either inpatient or outpatient if the patient is otherwise stable for discharge.    Date: 10/02/2015  MRN# [id number] Rodney Stewart [birth year]  Referring Physician: Dr. Raelyn Number [name] is a 60 y.o. old male seen in consultation for chief complaint of:    Chief Complaint  Patient presents with  . Chest Pain    HPI:   The patient is a 60 yo male with a history of renal cell cancer s/p left nephrectomy in May of 2016 with CKD since that time. He presents now with acute bronchitis symptoms of mild dyspnea and cough for the past 3 weeks, he then developed left scapular pain about 1 week ago. These symptoms have been progressive, and he presented to the hospital.  CXR, Ct scan images from this admission were reviewed: CXR showed left hilar fullness, therefore CT chest was performed. This showed multiple small nodule throughout both lungs. There was also significant bi-hilar lymphadenopathy as well as large subcarinal lymphadenopathy extending to the right infra-hilar region.   The patient is a former smoker, last smoked about 20 years ago. He worked in [employer], retired in 2008. No known occupational exposures.     PMHX:   Past Medical History:  Diagnosis Date  . BPH (benign prostatic hyperplasia)   . CKD (chronic kidney disease) stage 3, GFR 30-59 ml/min   . Clear cell renal cell carcinoma (HCC)   . Diabetes mellitus without complication ([place])   . History of nephrectomy   . Hypertension    Surgical Hx:  Past Surgical History:  Procedure  Laterality Date  . KNEE SURGERY    . NEPHRECTOMY     Family Hx:  Family History  Problem Relation Age of Onset  . Cancer Mother   . Diabetes Father   . Hypertension Father    Social Hx:   Social History  Substance Use Topics  . Smoking status: Never Smoker  . Smokeless tobacco: Never Used  . Alcohol use 25.2 oz/week    42 Cans of beer per week     Comment: 5-6 cans a day   Medication:       Allergies:  Review of patient's allergies indicates no known allergies.  Review of Systems: Gen:  Denies  fever, sweats, chills HEENT: Denies blurred vision, double vision. Bleeds. Cvc:  No dizziness, chest pain. Resp:   Denies shortness of breath Gi: Denies swallowing difficulty, stomach pain. Gu:  Denies bladder incontinence, burning urine Ext:   No Joint pain, stiffness. Skin: No skin rash,  hives  Endoc:  No polyuria, polydipsia. Psych: No depression, insomnia. Other:  All other systems were reviewed with the patient and were negative other that what is mentioned in the HPI.   Physical Examination:   VS: BP 137/77 (BP Location: Right Arm)   Pulse 66   Temp 98 F (36.7 C) (Oral)   Resp 17   Ht '5\' 11"'$  (1.803 m)   Wt 186 lb 11.2 oz (84.7 kg)   SpO2 98%   BMI 26.04 kg/m   General Appearance: No distress  Neuro:without  focal findings,  speech normal,  HEENT: PERRLA, EOM intact.   Pulmonary: normal breath sounds, No wheezing.  CardiovascularNormal S1,S2.  No m/r/g.   Abdomen: Benign, Soft, non-tender. Renal:  No costovertebral tenderness  GU:  No performed at this time. Endoc: No evident thyromegaly, no signs of acromegaly. Skin:   warm, no rashes, no ecchymosis  Extremities: normal, no cyanosis, clubbing.  Other findings:    LABORATORY PANEL:   CBC  Recent Labs Lab 10/01/15 0442  WBC 4.1  HGB 9.2*  HCT 26.8*  PLT 97*   ------------------------------------------------------------------------------------------------------------------  Chemistries    Recent Labs Lab 09/30/15 1145  10/02/15 0738  NA 128*  < > 135  K 5.3*  < > 5.1  CL 95*  < > 105  CO2 27  < > 25  GLUCOSE 376*  < > 84  BUN 25*  < > 27*  CREATININE 3.53*  < > 3.16*  CALCIUM 9.6  < > 8.3*  AST 46*  --   --   ALT 28  --   --   ALKPHOS 231*  --   --   BILITOT 1.5*  --   --   < > = values in this interval not displayed. ------------------------------------------------------------------------------------------------------------------  Cardiac Enzymes  Recent Labs Lab 09/30/15 1631  TROPONINI <0.03   ------------------------------------------------------------  RADIOLOGY:  Ct Abdomen Pelvis Wo Contrast  Result Date: 10/02/2015 CLINICAL DATA:  60 year old male with history of renal cell carcinoma status post left nephrectomy in May 2014. Multiple pulmonary nodules and mediastinal lymphadenopathy noted on recent chest CT. Possible recurrent metastatic disease. EXAM: CT ABDOMEN AND PELVIS WITHOUT CONTRAST TECHNIQUE: Multidetector CT imaging of the abdomen and pelvis was performed following the standard protocol without IV contrast. COMPARISON:  CT the abdomen and pelvis 05/09/2010. FINDINGS: Lower chest: Multiple pulmonary nodules are again noted throughout the visualize lung bases bilaterally, the largest of which measures 20 x 19 mm in the left lower lobe (image 4 of series 3). Hepatobiliary: Mild diffuse decreased attenuation throughout the hepatic parenchyma, compatible with hepatic steatosis. No discrete cystic or solid hepatic lesions are confidently identified on today's noncontrast CT examination. Liver early nodular contour, suggestive of underlying cirrhosis. Several calcified gallstones are noted in the neck of the gallbladder. Gallbladder does not appear distended. Pancreas: No definite pancreatic mass or peripancreatic inflammatory changes. Spleen: The spleen is enlarged measuring 16.8 x 8.2 x 18.9 cm (estimated splenic volume of 1,302 mL). Adrenals/Urinary  Tract: Status post left radical nephrectomy. No definite mass in the nephrectomy bed to suggest local recurrence of disease. Trace volume of free fluid in the nephrectomy bed, adjacent and multiple loops of small bowel. Unenhanced appearance of the right kidney is normal. Right adrenal gland is normal in appearance. Left adrenal gland is not confidently identified and may have been surgically resected. No right-sided hydroureteronephrosis. Urinary bladder is normal in appearance. Stomach/Bowel: The appearance of the stomach is normal. There is no pathologic dilatation of small bowel or colon. Normal appendix. Vascular/Lymphatic: Aortic atherosclerosis. Dilatation of the portal vein (2.1 cm in diameter). No definite aneurysm identified in the abdominal or pelvic vasculature. No lymphadenopathy noted in the abdomen or pelvis. Reproductive: Prostate gland and seminal vesicles are unremarkable in appearance. Other: Small volume of ascites, most evident adjacent to the liver and spleen. No pneumoperitoneum. Musculoskeletal: There are no aggressive appearing lytic or blastic lesions noted in the visualized portions of the skeleton. IMPRESSION: 1. No definite evidence to suggest local recurrence of disease in the nephrectomy  bed. No definite signs of metastatic disease in the abdomen or pelvis on today's noncontrast CT examination. 2. Multiple pulmonary nodules are again noted throughout the lung bases bilaterally, similar to the recent chest CT, concerning for metastatic disease. However, the possibility of a primary left lower lobe neoplasm with metastatic disease to the lungs and mediastinum is not excluded, and correlation with biopsy to establish a tissue diagnosis is suggested if clinically appropriate. 3. Hepatic steatosis with morphologic changes in the liver suggestive of underlying cirrhosis. This is associated with dilatation of the portal vein (2.1 cm in diameter) and splenomegaly, suggestive of underlying  portal hypertension. In addition, there is a small volume of ascites. 4. Cholelithiasis, without definitive evidence to suggest acute cholecystitis at this time. 5. Aortic atherosclerosis. Electronically Signed   By: Vinnie Langton M.D.   On: 10/02/2015 13:30   Dg Scapula Left  Result Date: 10/01/2015 CLINICAL DATA:  Left posterior back pain near scapula for 1 week after heavy lifting. EXAM: LEFT SCAPULA - 2+ VIEWS COMPARISON:  CT scan from yesterday FINDINGS: There is no evidence of fracture or other focal bone lesions. Soft tissues are unremarkable. IMPRESSION: Negative. Electronically Signed   By: Dorise Bullion III M.D   On: 10/01/2015 13:56       Thank  you for the consultation and for allowing [facility] to assist in the care of your patient. Our recommendations are noted above.  Please contact us if we can be of further service.   Marda Stalker, MD.  Board Certified in Internal Medicine, Pulmonary Medicine, [place], and Sleep Medicine.  [facility] Office Number: [phone]  Patricia Pesa, M.D.  Vilinda Boehringer, M.D.  Merton Border, M.D  10/02/2015

## 2015-10-02 NOTE — Progress Notes (Signed)
Patient ID: SEABRON IANNELLO, male   DOB: 22-Mar-1955, 60 y.o.   MRN: [id number]  [facility] PROGRESS NOTE  EVELYN MOCH EHM:[id number] DOB: April 25, 1955 DOA: 09/30/2015 PCP: No primary care provider on file.  HPI/Subjective: Patient taking pain medications for his left scapula pain. Patient urinating okay. He drinks 4-5 drinks per day but not feeling shaky at this point.  Objective: Vitals:   10/02/15 0547 10/02/15 1418  BP: 140/76 137/77  Pulse: 65 66  Resp: 16 17  Temp: 98 F (36.7 C)     Filed Weights   09/30/15 1059 09/30/15 1607  Weight: 86.2 kg (190 lb) 84.7 kg (186 lb 11.2 oz)    ROS: Review of Systems  Constitutional: Negative for chills and fever.  Eyes: Negative for blurred vision.  Respiratory: Negative for cough and shortness of breath.   Cardiovascular: Negative for chest pain.  Gastrointestinal: Negative for abdominal pain, constipation, diarrhea, nausea and vomiting.  Genitourinary: Negative for dysuria.  Musculoskeletal: Positive for back pain. Negative for joint pain.  Neurological: Negative for dizziness and headaches.   Exam: Physical Exam  Constitutional: He is oriented to person, place, and time.  HENT:  Nose: No mucosal edema.  Mouth/Throat: No oropharyngeal exudate or posterior oropharyngeal edema.  Eyes: Conjunctivae, EOM and lids are normal. Pupils are equal, round, and reactive to light.  Neck: No JVD present. Carotid bruit is not present. No edema present. No thyroid mass and no thyromegaly present.  Cardiovascular: S1 normal and S2 normal.  Exam reveals no gallop.   No murmur heard. Pulses:      Dorsalis pedis pulses are 2+ on the right side, and 2+ on the left side.  Respiratory: No respiratory distress. He has no wheezes. He has no rhonchi. He has no rales.  GI: Soft. Bowel sounds are normal. There is no tenderness.  Musculoskeletal:       Left shoulder: He exhibits normal range of motion and no tenderness.  Lymphadenopathy:    He has no  cervical adenopathy.  Neurological: He is alert and oriented to person, place, and time. No cranial nerve deficit.  Skin: Skin is warm. No rash noted. Nails show no clubbing.  Psychiatric: He has a normal mood and affect.      Data Reviewed: Basic Metabolic Panel:  Recent Labs Lab 09/30/15 1145 10/01/15 0442 10/02/15 0738  NA 128* 133* 135  K 5.3* 4.8 5.1  CL 95* 104 105  CO2 '27 22 25  '$ GLUCOSE 376* 137* 84  BUN 25* 28* 27*  CREATININE 3.53* 3.81* 3.16*  CALCIUM 9.6 8.2* 8.3*   Liver Function Tests:  Recent Labs Lab 09/30/15 1145  AST 46*  ALT 28  ALKPHOS 231*  BILITOT 1.5*  PROT 7.4  ALBUMIN 3.3*   CBC:  Recent Labs Lab 09/30/15 1145 10/01/15 0442  WBC 3.1* 4.1  NEUTROABS 2.2  --   HGB 10.5* 9.2*  HCT 31.0* 26.8*  MCV 92.3 92.0  PLT 105* 97*   Cardiac Enzymes:  Recent Labs Lab 09/30/15 1145 09/30/15 1631  CKTOTAL 42*  --   TROPONINI <0.03 <0.03    CBG:  Recent Labs Lab 10/01/15 1125 10/01/15 1624 10/01/15 2202 10/02/15 0731 10/02/15 1140  GLUCAP 181* 226* 183* 96 142*      Studies: Ct Abdomen Pelvis Wo Contrast  Result Date: 10/02/2015 CLINICAL DATA:  60 year old male with history of renal cell carcinoma status post left nephrectomy in May 2014. Multiple pulmonary nodules and mediastinal lymphadenopathy noted on recent chest  CT. Possible recurrent metastatic disease. EXAM: CT ABDOMEN AND PELVIS WITHOUT CONTRAST TECHNIQUE: Multidetector CT imaging of the abdomen and pelvis was performed following the standard protocol without IV contrast. COMPARISON:  CT the abdomen and pelvis 05/09/2010. FINDINGS: Lower chest: Multiple pulmonary nodules are again noted throughout the visualize lung bases bilaterally, the largest of which measures 20 x 19 mm in the left lower lobe (image 4 of series 3). Hepatobiliary: Mild diffuse decreased attenuation throughout the hepatic parenchyma, compatible with hepatic steatosis. No discrete cystic or solid hepatic  lesions are confidently identified on today's noncontrast CT examination. Liver early nodular contour, suggestive of underlying cirrhosis. Several calcified gallstones are noted in the neck of the gallbladder. Gallbladder does not appear distended. Pancreas: No definite pancreatic mass or peripancreatic inflammatory changes. Spleen: The spleen is enlarged measuring 16.8 x 8.2 x 18.9 cm (estimated splenic volume of 1,302 mL). Adrenals/Urinary Tract: Status post left radical nephrectomy. No definite mass in the nephrectomy bed to suggest local recurrence of disease. Trace volume of free fluid in the nephrectomy bed, adjacent and multiple loops of small bowel. Unenhanced appearance of the right kidney is normal. Right adrenal gland is normal in appearance. Left adrenal gland is not confidently identified and may have been surgically resected. No right-sided hydroureteronephrosis. Urinary bladder is normal in appearance. Stomach/Bowel: The appearance of the stomach is normal. There is no pathologic dilatation of small bowel or colon. Normal appendix. Vascular/Lymphatic: Aortic atherosclerosis. Dilatation of the portal vein (2.1 cm in diameter). No definite aneurysm identified in the abdominal or pelvic vasculature. No lymphadenopathy noted in the abdomen or pelvis. Reproductive: Prostate gland and seminal vesicles are unremarkable in appearance. Other: Small volume of ascites, most evident adjacent to the liver and spleen. No pneumoperitoneum. Musculoskeletal: There are no aggressive appearing lytic or blastic lesions noted in the visualized portions of the skeleton. IMPRESSION: 1. No definite evidence to suggest local recurrence of disease in the nephrectomy bed. No definite signs of metastatic disease in the abdomen or pelvis on today's noncontrast CT examination. 2. Multiple pulmonary nodules are again noted throughout the lung bases bilaterally, similar to the recent chest CT, concerning for metastatic disease.  However, the possibility of a primary left lower lobe neoplasm with metastatic disease to the lungs and mediastinum is not excluded, and correlation with biopsy to establish a tissue diagnosis is suggested if clinically appropriate. 3. Hepatic steatosis with morphologic changes in the liver suggestive of underlying cirrhosis. This is associated with dilatation of the portal vein (2.1 cm in diameter) and splenomegaly, suggestive of underlying portal hypertension. In addition, there is a small volume of ascites. 4. Cholelithiasis, without definitive evidence to suggest acute cholecystitis at this time. 5. Aortic atherosclerosis. Electronically Signed   By: Vinnie Langton M.D.   On: 10/02/2015 13:30   Dg Scapula Left  Result Date: 10/01/2015 CLINICAL DATA:  Left posterior back pain near scapula for 1 week after heavy lifting. EXAM: LEFT SCAPULA - 2+ VIEWS COMPARISON:  CT scan from yesterday FINDINGS: There is no evidence of fracture or other focal bone lesions. Soft tissues are unremarkable. IMPRESSION: Negative. Electronically Signed   By: Dorise Bullion III M.D   On: 10/01/2015 13:56    Scheduled Meds: . budesonide (PULMICORT) nebulizer solution  0.25 mg Nebulization BID  . folic acid  1 mg Oral Daily  . gabapentin  100 mg Oral TID  . insulin aspart  0-5 Units Subcutaneous QHS  . insulin aspart  0-9 Units Subcutaneous TID WC  .  insulin detemir  48 Units Subcutaneous Q2200  . ipratropium-albuterol  3 mL Nebulization Q6H  . multivitamin with minerals  1 tablet Oral Daily  . nebivolol  20 mg Oral Daily  . pravastatin  20 mg Oral q1800  . sodium chloride flush  3 mL Intravenous Q12H  . tamsulosin  0.4 mg Oral Daily  . thiamine  100 mg Oral Daily   Continuous Infusions: . sodium chloride 50 mL/hr at 10/02/15 1015    Assessment/Plan:  1. Acute kidney injury. Patient history of one kidney secondary to renal carcinoma surgery. Continue IV fluids. Renal sonogram negative. Probably worsened from  Advil and BC powder. Hold Cozaar. Creatinine improved to 3.18 today.  Likely will be here a few more days until creatinine improves further. 2. Abnormal CT scan of the chest with likely metastatic disease.  Case discussed with interventional radiology and they recommended a bronchoscopy. CT scan of the abdomen and pelvis without contrast did not show anything that would be able to be biopsied. I spoke with pulmonologist to see the patient to set up a bronchoscopy. 3. Alcohol abuse. Continue thiamine. Monitor for withdrawal. 4. Cirrhosis seen on CT scan of the chest. Advised to stop drinking alcohol. 5. Type 2 diabetes mellitus on detemir insulin and sliding scale 6. Hyperlipidemia unspecified on pravastatin 7. BPH on Flomax  Code Status:     Code Status Orders        Start     Ordered   09/30/15 1610  Full code  Continuous     09/30/15 1609    Code Status History    Date Active Date Inactive Code Status Order ID Comments User Context   This patient has a current code status but no historical code status.     Family Communication: Spoke with the patient's brother on the phone. Delfino Lovett [phone]) Disposition Plan: Home once kidney function improves  Consultants:  Nephrology  Oncology  Pulmonology  Time spent:  25 minutes [place], [facility]

## 2015-10-02 NOTE — Progress Notes (Signed)
[facility]  ROUNDING NOTE   Subjective:  Renal function has improved a bit today. Creatinine down to 3.1. No recurrence of renal cell carcinoma in the nephrectomy bed. Multiple pulmonary nodules noted on CT scan abdomen and pelvis today.   Objective:  Vital signs in last 24 hours:  Temp:  [98 F (36.7 C)-98.8 F (37.1 C)] 98 F (36.7 C) (10/01 0547) Pulse Rate:  [65-77] 66 (10/01 1418) Resp:  [16-17] 17 (10/01 1418) BP: (137-140)/(68-77) 137/77 (10/01 1418) SpO2:  [98 %-100 %] 98 % (10/01 1418)  Weight change:  Filed Weights   09/30/15 1059 09/30/15 1607  Weight: 86.2 kg (190 lb) 84.7 kg (186 lb 11.2 oz)    Intake/Output: I/O last 3 completed shifts: In: 2665.3 [P.O.:540; I.V.:2125.3] Out: 2250 [Urine:2250]   Intake/Output this shift:  Total I/O In: 978 [P.O.:240; I.V.:738] Out: 1400 [Urine:1400]  Physical Exam: General: No acute distress  Head: Normocephalic, atraumatic. Moist oral mucosal membranes  Eyes: Anicteric  Neck: Supple, trachea midline  Lungs:  Clear to auscultation, normal effort  Heart: S1S2 no rubs  Abdomen:  Soft, nontender,   Extremities:  peripheral edema.  Neurologic: Nonfocal, moving all four extremities  Skin: No lesions       Basic Metabolic Panel:  Recent Labs Lab 09/30/15 1145 10/01/15 0442 10/02/15 0738  NA 128* 133* 135  K 5.3* 4.8 5.1  CL 95* 104 105  CO2 '27 22 25  '$ GLUCOSE 376* 137* 84  BUN 25* 28* 27*  CREATININE 3.53* 3.81* 3.16*  CALCIUM 9.6 8.2* 8.3*    Liver Function Tests:  Recent Labs Lab 09/30/15 1145  AST 46*  ALT 28  ALKPHOS 231*  BILITOT 1.5*  PROT 7.4  ALBUMIN 3.3*   No results for input(s): LIPASE, AMYLASE in the last 168 hours. No results for input(s): AMMONIA in the last 168 hours.  CBC:  Recent Labs Lab 09/30/15 1145 10/01/15 0442  WBC 3.1* 4.1  NEUTROABS 2.2  --   HGB 10.5* 9.2*  HCT 31.0* 26.8*  MCV 92.3 92.0  PLT 105* 97*    Cardiac Enzymes:  Recent Labs Lab  09/30/15 1145 09/30/15 1631  CKTOTAL 42*  --   TROPONINI <0.03 <0.03    BNP: Invalid input(s): POCBNP  CBG:  Recent Labs Lab 10/01/15 1624 10/01/15 2202 10/02/15 0731 10/02/15 1140 10/02/15 1629  GLUCAP 226* 183* 96 142* 105*    Microbiology: Results for orders placed or performed in visit on 04/26/12  Urine culture     Status: None   Collection Time: 04/26/12  4:20 PM  Result Value Ref Range Status   Micro Text Report   Final       SOURCE: INDWELLING CATHETER    COMMENT                   NO GROWTH IN 36 HOURS   ANTIBIOTIC                                                        Coagulation Studies:  Recent Labs  09/30/15 1145  LABPROT 14.8  INR 1.15    Urinalysis: No results for input(s): COLORURINE, LABSPEC, PHURINE, GLUCOSEU, HGBUR, BILIRUBINUR, KETONESUR, PROTEINUR, UROBILINOGEN, NITRITE, LEUKOCYTESUR in the last 72 hours.  Invalid input(s): APPERANCEUR    Imaging: Ct Abdomen Pelvis Wo Contrast  Result Date: 10/02/2015 CLINICAL DATA:  60 year old male with history of renal cell carcinoma status post left nephrectomy in May 2014. Multiple pulmonary nodules and mediastinal lymphadenopathy noted on recent chest CT. Possible recurrent metastatic disease. EXAM: CT ABDOMEN AND PELVIS WITHOUT CONTRAST TECHNIQUE: Multidetector CT imaging of the abdomen and pelvis was performed following the standard protocol without IV contrast. COMPARISON:  CT the abdomen and pelvis 05/09/2010. FINDINGS: Lower chest: Multiple pulmonary nodules are again noted throughout the visualize lung bases bilaterally, the largest of which measures 20 x 19 mm in the left lower lobe (image 4 of series 3). Hepatobiliary: Mild diffuse decreased attenuation throughout the hepatic parenchyma, compatible with hepatic steatosis. No discrete cystic or solid hepatic lesions are confidently identified on today's noncontrast CT examination. Liver early nodular contour, suggestive of underlying cirrhosis.  Several calcified gallstones are noted in the neck of the gallbladder. Gallbladder does not appear distended. Pancreas: No definite pancreatic mass or peripancreatic inflammatory changes. Spleen: The spleen is enlarged measuring 16.8 x 8.2 x 18.9 cm (estimated splenic volume of 1,302 mL). Adrenals/Urinary Tract: Status post left radical nephrectomy. No definite mass in the nephrectomy bed to suggest local recurrence of disease. Trace volume of free fluid in the nephrectomy bed, adjacent and multiple loops of small bowel. Unenhanced appearance of the right kidney is normal. Right adrenal gland is normal in appearance. Left adrenal gland is not confidently identified and may have been surgically resected. No right-sided hydroureteronephrosis. Urinary bladder is normal in appearance. Stomach/Bowel: The appearance of the stomach is normal. There is no pathologic dilatation of small bowel or colon. Normal appendix. Vascular/Lymphatic: Aortic atherosclerosis. Dilatation of the portal vein (2.1 cm in diameter). No definite aneurysm identified in the abdominal or pelvic vasculature. No lymphadenopathy noted in the abdomen or pelvis. Reproductive: Prostate gland and seminal vesicles are unremarkable in appearance. Other: Small volume of ascites, most evident adjacent to the liver and spleen. No pneumoperitoneum. Musculoskeletal: There are no aggressive appearing lytic or blastic lesions noted in the visualized portions of the skeleton. IMPRESSION: 1. No definite evidence to suggest local recurrence of disease in the nephrectomy bed. No definite signs of metastatic disease in the abdomen or pelvis on today's noncontrast CT examination. 2. Multiple pulmonary nodules are again noted throughout the lung bases bilaterally, similar to the recent chest CT, concerning for metastatic disease. However, the possibility of a primary left lower lobe neoplasm with metastatic disease to the lungs and mediastinum is not excluded, and  correlation with biopsy to establish a tissue diagnosis is suggested if clinically appropriate. 3. Hepatic steatosis with morphologic changes in the liver suggestive of underlying cirrhosis. This is associated with dilatation of the portal vein (2.1 cm in diameter) and splenomegaly, suggestive of underlying portal hypertension. In addition, there is a small volume of ascites. 4. Cholelithiasis, without definitive evidence to suggest acute cholecystitis at this time. 5. Aortic atherosclerosis. Electronically Signed   By: Vinnie Langton M.D.   On: 10/02/2015 13:30   Dg Scapula Left  Result Date: 10/01/2015 CLINICAL DATA:  Left posterior back pain near scapula for 1 week after heavy lifting. EXAM: LEFT SCAPULA - 2+ VIEWS COMPARISON:  CT scan from yesterday FINDINGS: There is no evidence of fracture or other focal bone lesions. Soft tissues are unremarkable. IMPRESSION: Negative. Electronically Signed   By: Dorise Bullion III M.D   On: 10/01/2015 13:56     Medications:   . sodium chloride 50 mL/hr at 10/02/15 1015   . budesonide (PULMICORT) nebulizer solution  0.25 mg Nebulization BID  . folic acid  1 mg Oral Daily  . gabapentin  100 mg Oral TID  . insulin aspart  0-5 Units Subcutaneous QHS  . insulin aspart  0-9 Units Subcutaneous TID WC  . insulin detemir  48 Units Subcutaneous Q2200  . ipratropium-albuterol  3 mL Nebulization Q6H  . multivitamin with minerals  1 tablet Oral Daily  . nebivolol  20 mg Oral Daily  . pravastatin  20 mg Oral q1800  . sodium chloride flush  3 mL Intravenous Q12H  . tamsulosin  0.4 mg Oral Daily  . thiamine  100 mg Oral Daily   acetaminophen **OR** acetaminophen, albuterol, fentaNYL (SUBLIMAZE) injection, LORazepam **OR** LORazepam, ondansetron **OR** ondansetron (ZOFRAN) IV, oxyCODONE-acetaminophen  Assessment/ Plan:  60 y.o. male with a PMHx of BPH, CKD stage III baseline Cr 1.5, Renal cell carcinoma status post left nephrectomy 05/2012, diabetes mellitus  type 2, hypertension who was admitted to [facility] on 09/30/2015 for evaluation of shoulder blade pain as well as cough. Patient had CT scan of the chest which revealed multiple bilateral pulmonary lesions as well as mediastinal lymphadenopathy which was concerning for possible recurrent metastatic disease from his renal cell carcinoma vs new primary lung cancer.  1. Acute renal failure/chronic kidney disease stage III. Baseline creatinine 1.5 01/28/2015. Renal function significantly worse now. Patient had been taking BC powders at home and has had poor by mouth intake.  Renal ultrasound showed a normal right kidney.  -  Renal function continues to improve. Continue IV fluid hydration for now.Follow-up serum creatinine tomorrow. Avoid nephrotoxins as possible.  Awaiting SPEP and UPEP.  2. Anemia of chronic kidney disease. Hemoglobin at last check was 9.2. We are checking SPEP and UPEP as above.  3. Hyponatremia. This could potentially be related to the pulmonary masses seen and patient may have mild underlying SIADH. Serum sodium up to 135.  4. Recurrent renal cell carcinoma versus new primary lung cancer. Multiple pulmonary nodules noted, will need biopsy of one of these nodules, may need bronchoscopy vs CT guided lung biopsy.     LOS: 2 Ciaira Natividad 10/1/20175:07 PM

## 2015-10-03 LAB — BASIC METABOLIC PANEL
ANION GAP: 6 (ref 5–15)
BUN: 22 mg/dL — ABNORMAL HIGH (ref 6–20)
CHLORIDE: 103 mmol/L (ref 101–111)
CO2: 26 mmol/L (ref 22–32)
Calcium: 8.2 mg/dL — ABNORMAL LOW (ref 8.9–10.3)
Creatinine, Ser: 2.28 mg/dL — ABNORMAL HIGH (ref 0.61–1.24)
GFR calc non Af Amer: 30 mL/min — ABNORMAL LOW (ref >60)
GFR, EST AFRICAN AMERICAN: 34 mL/min — AB (ref >60)
GLUCOSE: 72 mg/dL (ref 65–99)
Potassium: 4.4 mmol/L (ref 3.5–5.1)
Sodium: 135 mmol/L (ref 135–145)

## 2015-10-03 LAB — GLUCOSE, CAPILLARY: Glucose-Capillary: 92 mg/dL (ref 65–99)

## 2015-10-03 MED ORDER — ENSURE ENLIVE PO LIQD
237.0000 mL | Freq: Two times a day (BID) | ORAL | Status: DC
  Administered 2015-10-03: 237 mL via ORAL

## 2015-10-03 NOTE — Progress Notes (Signed)
Patient A&O, VSS.  No complaints of pain.  Tolerating diet.  Discharge instructions reviewed with patient and daughter in detail; medications and followup appointments also reviewed.  Understanding was verbalized and all questions were answered.  IV removed without difficulty and patient tolerated well.  Patient discharged home via wheelchair in stable condition escorted by volunteer staff.

## 2015-10-03 NOTE — Progress Notes (Signed)
Nutrition Brief Note  RD pulled to patient for consult to assess  Wt Readings from Last 15 Encounters:  09/30/15 186 lb 11.2 oz (84.7 kg)    Body mass index is 26.04 kg/m. Patient meets criteria for normal weight based on current BMI.   Current diet order is heart healthy/carb mod, patient is consuming approximately 100% of meals at this time. Labs and medications reviewed.   Patient did request Ensure at this time, states he drinks every day at home, will provide. If nutrition issues arise, please consult RD.   Satira Anis. Willem Klingensmith, MS, RD LDN Inpatient Clinical Dietitian Pager [phone]

## 2015-10-03 NOTE — Discharge Instructions (Signed)
Heart healthy and ADA diet. Follow up bronchoscopy as outpatient.  Contact your physician for worsening pain, cough, shortness of breath or difficulty breathing; decrease in urine output or any difficulty producing urine; any other questions or concerns.  Please keep followup appointments as outlined above.

## 2015-10-03 NOTE — Progress Notes (Signed)
[facility]  ROUNDING NOTE   Subjective:   Daughter at bedside.  Creatinine 2.28 (3.16)   Objective:  Vital signs in last 24 hours:  Temp:  [97.3 F (36.3 C)-98.7 F (37.1 C)] 98.7 F (37.1 C) (10/02 0830) Pulse Rate:  [66-79] 73 (10/02 0830) Resp:  [16-20] 19 (10/02 0830) BP: (137-162)/(75-83) 161/83 (10/02 0830) SpO2:  [98 %-100 %] 99 % (10/02 0830)  Weight change:  Filed Weights   09/30/15 1059 09/30/15 1607  Weight: 86.2 kg (190 lb) 84.7 kg (186 lb 11.2 oz)    Intake/Output: I/O last 3 completed shifts: In: 2780 [P.O.:960; I.V.:1820] Out: 4475 [Urine:4475]   Intake/Output this shift:  Total I/O In: 270 [I.V.:270] Out: 650 [Urine:650]  Physical Exam: General: No acute distress  Head: Normocephalic, atraumatic. Moist oral mucosal membranes  Eyes: Anicteric  Neck: Supple, trachea midline  Lungs:  Clear to auscultation, normal effort  Heart: S1S2 no rubs  Abdomen:  Soft, nontender,   Extremities:  peripheral edema.  Neurologic: Nonfocal, moving all four extremities  Skin: No lesions       Basic Metabolic Panel:  Recent Labs Lab 09/30/15 1145 10/01/15 0442 10/02/15 0738 10/03/15 0540  NA 128* 133* 135 135  K 5.3* 4.8 5.1 4.4  CL 95* 104 105 103  CO2 '27 22 25 26  '$ GLUCOSE 376* 137* 84 72  BUN 25* 28* 27* 22*  CREATININE 3.53* 3.81* 3.16* 2.28*  CALCIUM 9.6 8.2* 8.3* 8.2*    Liver Function Tests:  Recent Labs Lab 09/30/15 1145  AST 46*  ALT 28  ALKPHOS 231*  BILITOT 1.5*  PROT 7.4  ALBUMIN 3.3*   No results for input(s): LIPASE, AMYLASE in the last 168 hours. No results for input(s): AMMONIA in the last 168 hours.  CBC:  Recent Labs Lab 09/30/15 1145 10/01/15 0442  WBC 3.1* 4.1  NEUTROABS 2.2  --   HGB 10.5* 9.2*  HCT 31.0* 26.8*  MCV 92.3 92.0  PLT 105* 97*    Cardiac Enzymes:  Recent Labs Lab 09/30/15 1145 09/30/15 1631  CKTOTAL 42*  --   TROPONINI <0.03 <0.03    BNP: Invalid input(s):  POCBNP  CBG:  Recent Labs Lab 10/02/15 0731 10/02/15 1140 10/02/15 1629 10/02/15 2156 10/03/15 0805  GLUCAP 96 142* 105* 205* 25    Microbiology: Results for orders placed or performed in visit on 04/26/12  Urine culture     Status: None   Collection Time: 04/26/12  4:20 PM  Result Value Ref Range Status   Micro Text Report   Final       SOURCE: INDWELLING CATHETER    COMMENT                   NO GROWTH IN 36 HOURS   ANTIBIOTIC                                                        Coagulation Studies: No results for input(s): LABPROT, INR in the last 72 hours.  Urinalysis: No results for input(s): COLORURINE, LABSPEC, PHURINE, GLUCOSEU, HGBUR, BILIRUBINUR, KETONESUR, PROTEINUR, UROBILINOGEN, NITRITE, LEUKOCYTESUR in the last 72 hours.  Invalid input(s): APPERANCEUR    Imaging: Ct Abdomen Pelvis Wo Contrast  Result Date: 10/02/2015 CLINICAL DATA:  60 year old male with history of renal cell carcinoma status post left  nephrectomy in May 2014. Multiple pulmonary nodules and mediastinal lymphadenopathy noted on recent chest CT. Possible recurrent metastatic disease. EXAM: CT ABDOMEN AND PELVIS WITHOUT CONTRAST TECHNIQUE: Multidetector CT imaging of the abdomen and pelvis was performed following the standard protocol without IV contrast. COMPARISON:  CT the abdomen and pelvis 05/09/2010. FINDINGS: Lower chest: Multiple pulmonary nodules are again noted throughout the visualize lung bases bilaterally, the largest of which measures 20 x 19 mm in the left lower lobe (image 4 of series 3). Hepatobiliary: Mild diffuse decreased attenuation throughout the hepatic parenchyma, compatible with hepatic steatosis. No discrete cystic or solid hepatic lesions are confidently identified on today's noncontrast CT examination. Liver early nodular contour, suggestive of underlying cirrhosis. Several calcified gallstones are noted in the neck of the gallbladder. Gallbladder does not appear  distended. Pancreas: No definite pancreatic mass or peripancreatic inflammatory changes. Spleen: The spleen is enlarged measuring 16.8 x 8.2 x 18.9 cm (estimated splenic volume of 1,302 mL). Adrenals/Urinary Tract: Status post left radical nephrectomy. No definite mass in the nephrectomy bed to suggest local recurrence of disease. Trace volume of free fluid in the nephrectomy bed, adjacent and multiple loops of small bowel. Unenhanced appearance of the right kidney is normal. Right adrenal gland is normal in appearance. Left adrenal gland is not confidently identified and may have been surgically resected. No right-sided hydroureteronephrosis. Urinary bladder is normal in appearance. Stomach/Bowel: The appearance of the stomach is normal. There is no pathologic dilatation of small bowel or colon. Normal appendix. Vascular/Lymphatic: Aortic atherosclerosis. Dilatation of the portal vein (2.1 cm in diameter). No definite aneurysm identified in the abdominal or pelvic vasculature. No lymphadenopathy noted in the abdomen or pelvis. Reproductive: Prostate gland and seminal vesicles are unremarkable in appearance. Other: Small volume of ascites, most evident adjacent to the liver and spleen. No pneumoperitoneum. Musculoskeletal: There are no aggressive appearing lytic or blastic lesions noted in the visualized portions of the skeleton. IMPRESSION: 1. No definite evidence to suggest local recurrence of disease in the nephrectomy bed. No definite signs of metastatic disease in the abdomen or pelvis on today's noncontrast CT examination. 2. Multiple pulmonary nodules are again noted throughout the lung bases bilaterally, similar to the recent chest CT, concerning for metastatic disease. However, the possibility of a primary left lower lobe neoplasm with metastatic disease to the lungs and mediastinum is not excluded, and correlation with biopsy to establish a tissue diagnosis is suggested if clinically appropriate. 3.  Hepatic steatosis with morphologic changes in the liver suggestive of underlying cirrhosis. This is associated with dilatation of the portal vein (2.1 cm in diameter) and splenomegaly, suggestive of underlying portal hypertension. In addition, there is a small volume of ascites. 4. Cholelithiasis, without definitive evidence to suggest acute cholecystitis at this time. 5. Aortic atherosclerosis. Electronically Signed   By: Vinnie Langton M.D.   On: 10/02/2015 13:30   Dg Scapula Left  Result Date: 10/01/2015 CLINICAL DATA:  Left posterior back pain near scapula for 1 week after heavy lifting. EXAM: LEFT SCAPULA - 2+ VIEWS COMPARISON:  CT scan from yesterday FINDINGS: There is no evidence of fracture or other focal bone lesions. Soft tissues are unremarkable. IMPRESSION: Negative. Electronically Signed   By: Dorise Bullion III M.D   On: 10/01/2015 13:56     Medications:   . sodium chloride 50 mL/hr at 10/03/15 0141   . budesonide (PULMICORT) nebulizer solution  0.25 mg Nebulization BID  . feeding supplement (ENSURE ENLIVE)  237 mL Oral BID BM  .  folic acid  1 mg Oral Daily  . gabapentin  100 mg Oral TID  . insulin aspart  0-5 Units Subcutaneous QHS  . insulin aspart  0-9 Units Subcutaneous TID WC  . insulin detemir  48 Units Subcutaneous Q2200  . ipratropium-albuterol  3 mL Nebulization Q6H  . multivitamin with minerals  1 tablet Oral Daily  . nebivolol  20 mg Oral Daily  . pravastatin  20 mg Oral q1800  . sodium chloride flush  3 mL Intravenous Q12H  . tamsulosin  0.4 mg Oral Daily  . thiamine  100 mg Oral Daily   acetaminophen **OR** acetaminophen, albuterol, fentaNYL (SUBLIMAZE) injection, LORazepam **OR** LORazepam, ondansetron **OR** ondansetron (ZOFRAN) IV, oxyCODONE-acetaminophen  Assessment/ Plan:  60 y.o. white male with a PMHx of BPH, CKD stage III baseline Cr 1.5, Renal cell carcinoma status post left nephrectomy 05/2012, diabetes mellitus type 2, hypertension who was  admitted to [facility] on 09/30/2015 for evaluation of shoulder blade pain as well as cough. Patient had CT scan of the chest which revealed multiple bilateral pulmonary lesions as well as mediastinal lymphadenopathy which was concerning for possible recurrent metastatic disease from his renal cell carcinoma vs new primary lung cancer.  1. Acute renal failure on chronic kidney disease stage III. Baseline creatinine 1.5 01/28/2015.  Acute renal failure from [facility] powders at home and has had poor by mouth intake.  Renal ultrasound showed a normal right kidney.  - patient to avoid NSAIDs - Follow up with Dr. Holley Raring on 10/11/15 at 9:20am  2. Anemia of chronic kidney disease: Pending SPEP/UPEP. Normocytic.   3. Hyponatremia: improved.   4. Recurrent renal cell carcinoma versus new primary lung cancer. Multiple pulmonary nodules noted, will need biopsy of one of these nodules, may need bronchoscopy vs CT guided lung biopsy.     LOS: [place], [place] 10/2/20171:11 PM

## 2015-10-03 NOTE — Discharge Summary (Signed)
[place] [facility] [place] NAME: Rodney Stewart    MR#:  [id number]  DATE OF BIRTH:  [birth year]  DATE OF ADMISSION:  09/30/2015   ADMITTING PHYSICIAN: Demetrios Loll, MD  DATE OF DISCHARGE: 10/03/2015 12:25 PM  PRIMARY CARE PHYSICIAN: No primary care provider on file.   ADMISSION DIAGNOSIS:  Abnormal finding on imaging [R93.8] AKI (acute kidney injury) ([place]) [N17.9] Chest pain, unspecified chest pain type [R07.9] DISCHARGE DIAGNOSIS:  Principal Problem:   ARF (acute renal failure) (HCC) Active Problems:   Renal cell carcinoma ([place]) ARF on CKD stage 3. Abnormal CT scan of the chest with likely metastatic disease SECONDARY DIAGNOSIS:   Past Medical History:  Diagnosis Date  . BPH (benign prostatic hyperplasia)   . CKD (chronic kidney disease) stage 3, GFR 30-59 ml/min   . Clear cell renal cell carcinoma (HCC)   . Diabetes mellitus without complication ([place])   . History of nephrectomy   . Hypertension    HOSPITAL COURSE:   1. Acute kidney injury on CKD stage 3.. Patient history of one kidney secondary to renal carcinoma surgery. Renal sonogram negative. Probably worsened from Advil and BC powder. Hold Cozaar.  Improving with IV fluids, Cr. Down to 2.2.  2. Abnormal CT scan of the chest with likely metastatic disease.  Case discussed with interventional radiology and they recommended a bronchoscopy. CT scan of the abdomen and pelvis without contrast did not show anything that would be able to be biopsied. bronchoscopy as outpatient per pulmonologist. F/u Dr. Grayland Ormond as outpatient. 3. Alcohol abuse. On CIWA, no withdrawal. 4. Cirrhosis seen on CT scan of the chest. Advised to stop drinking alcohol. 5. Type 2 diabetes mellitus on detemir insulin and sliding scale 6. Hyperlipidemia unspecified on pravastatin 7. BPH on Flomax I discussed with Dr. Juleen China. DISCHARGE CONDITIONS:  Stable, discharged to home today. CONSULTS OBTAINED:  Treatment  Team:  Anthonette Legato, MD Lloyd Huger, MD Gonzella Lex, MD [name], MD DRUG ALLERGIES:  No Known Allergies DISCHARGE MEDICATIONS:     Medication List    STOP taking these medications   losartan 100 MG tablet Commonly known as:  COZAAR     TAKE these medications   colchicine 0.6 MG tablet Take 0.6 mg by mouth daily.   gabapentin 100 MG capsule Commonly known as:  NEURONTIN Take 100 mg by mouth 3 (three) times daily.   LEVEMIR FLEXTOUCH 100 UNIT/ML Pen Generic drug:  Insulin Detemir Inject 48 Units into the skin daily at 10 pm.   lovastatin 20 MG tablet Commonly known as:  MEVACOR Take 20 mg by mouth daily at 6 PM.   Nebivolol HCl 20 MG Tabs Take 20 mg by mouth daily.   NOVOLOG 100 UNIT/ML injection Generic drug:  insulin aspart Inject 12 Units into the skin daily at 12 noon. At noon.   tamsulosin 0.4 MG Caps capsule Commonly known as:  FLOMAX Take 0.4 mg by mouth daily.        DISCHARGE INSTRUCTIONS:    If you experience worsening of your admission symptoms, develop shortness of breath, life threatening emergency, suicidal or homicidal thoughts you must seek medical attention immediately by calling 911 or calling your MD immediately  if symptoms less severe.  You Must read complete instructions/literature along with all the possible adverse reactions/side effects for all the Medicines you take and that have been prescribed to you. Take any new Medicines after you have completely understood and accpet all  the possible adverse reactions/side effects.   Please note  You were cared for by a hospitalist during your hospital stay. If you have any questions about your discharge medications or the care you received while you were in the hospital after you are discharged, you can call the unit and asked to speak with the hospitalist on call if the hospitalist that took care of you is not available. Once you are discharged, your primary care  physician will handle any further medical issues. Please note that NO REFILLS for any discharge medications will be authorized once you are discharged, as it is imperative that you return to your primary care physician (or establish a relationship with a primary care physician if you do not have one) for your aftercare needs so that they can reassess your need for medications and monitor your lab values.    On the day of Discharge:  VITAL SIGNS:  Blood pressure (!) 161/83, pulse 73, temperature 98.7 F (37.1 C), temperature source Oral, resp. rate 19, height '5\' 11"'$  (1.803 m), weight 186 lb 11.2 oz (84.7 kg), SpO2 99 %. PHYSICAL EXAMINATION:  GENERAL:  60 y.o.-year-old patient lying in the bed with no acute distress.  EYES: Pupils equal, round, reactive to light and accommodation. No scleral icterus. Extraocular muscles intact.  HEENT: Head atraumatic, normocephalic. Oropharynx and nasopharynx clear.  NECK:  Supple, no jugular venous distention. No thyroid enlargement, no tenderness.  LUNGS: Normal breath sounds bilaterally, no wheezing, rales,rhonchi or crepitation. No use of accessory muscles of respiration.  CARDIOVASCULAR: S1, S2 normal. No murmurs, rubs, or gallops.  ABDOMEN: Soft, non-tender, non-distended. Bowel sounds present. No organomegaly or mass.  EXTREMITIES: No pedal edema, cyanosis, or clubbing.  NEUROLOGIC: Cranial nerves II through XII are intact. Muscle strength 5/5 in all extremities. Sensation intact. Gait not checked.  PSYCHIATRIC: The patient is alert and oriented x 3.  SKIN: No obvious rash, lesion, or ulcer.  DATA REVIEW:   CBC  Recent Labs Lab 10/01/15 0442  WBC 4.1  HGB 9.2*  HCT 26.8*  PLT 97*    Chemistries   Recent Labs Lab 09/30/15 1145  10/03/15 0540  NA 128*  < > 135  K 5.3*  < > 4.4  CL 95*  < > 103  CO2 27  < > 26  GLUCOSE 376*  < > 72  BUN 25*  < > 22*  CREATININE 3.53*  < > 2.28*  CALCIUM 9.6  < > 8.2*  AST 46*  --   --   ALT 28  --    --   ALKPHOS 231*  --   --   BILITOT 1.5*  --   --   < > = values in this interval not displayed.   Microbiology Results  Results for orders placed or performed in visit on 04/26/12  Urine culture     Status: None   Collection Time: 04/26/12  4:20 PM  Result Value Ref Range Status   Micro Text Report   Final       SOURCE: INDWELLING CATHETER    COMMENT                   NO GROWTH IN 36 HOURS   ANTIBIOTIC  RADIOLOGY:  No results found.   Management plans discussed with the patient, family and they are in agreement.  CODE STATUS:  Code Status History    Date Active Date Inactive Code Status Order ID Comments User Context   09/30/2015  4:09 PM 10/03/2015  3:30 PM Full Code [id number]  Demetrios Loll, MD Inpatient      TOTAL TIME TAKING CARE OF THIS PATIENT: 33 minutes.    Demetrios Loll M.D on 10/03/2015 at 4:47 PM  Between 7am to 6pm - Pager - [phone]  After 6pm go to [email or web address] - [employer] [facility]  Office  [phone]  CC: Primary care physician; No primary care provider on file.   Note: This dictation was prepared with Dragon dictation along with smaller phrase technology. Any transcriptional errors that result from this process are unintentional.

## 2015-10-04 LAB — PROTEIN ELECTROPHORESIS, SERUM
A/G Ratio: 0.9 (ref 0.7–1.7)
ALPHA-1-GLOBULIN: 0.3 g/dL (ref 0.0–0.4)
ALPHA-2-GLOBULIN: 0.5 g/dL (ref 0.4–1.0)
Albumin ELP: 2.9 g/dL (ref 2.9–4.4)
Beta Globulin: 0.9 g/dL (ref 0.7–1.3)
GAMMA GLOBULIN: 1.5 g/dL (ref 0.4–1.8)
Globulin, Total: 3.1 g/dL (ref 2.2–3.9)
Total Protein ELP: 6 g/dL (ref 6.0–8.5)

## 2015-10-07 ENCOUNTER — Encounter
Admission: RE | Admit: 2015-10-07 | Discharge: 2015-10-07 | Disposition: A | Discharge status: Home / Self Care | Payer: Medicare HMO | Source: Ambulatory Visit | Attending: Internal Medicine | Admitting: Internal Medicine

## 2015-10-07 DIAGNOSIS — Z01818 Encounter for other preprocedural examination: Secondary | ICD-10-CM | POA: Insufficient documentation

## 2015-10-07 HISTORY — DX: Gastro-esophageal reflux disease without esophagitis: K21.9

## 2015-10-07 HISTORY — DX: Polyneuropathy, unspecified: G62.9

## 2015-10-07 HISTORY — DX: Anemia, unspecified: D64.9

## 2015-10-07 HISTORY — DX: Umbilical hernia without obstruction or gangrene: K42.9

## 2015-10-07 HISTORY — DX: Personal history of other diseases of the musculoskeletal system and connective tissue: Z87.39

## 2015-10-07 HISTORY — DX: Dyspnea, unspecified: R06.00

## 2015-10-07 LAB — CBC
HCT: 30.4 % — ABNORMAL LOW (ref 40.0–52.0)
HEMOGLOBIN: 10 g/dL — AB (ref 13.0–18.0)
MCH: 29.7 pg (ref 26.0–34.0)
MCHC: 33 g/dL (ref 32.0–36.0)
MCV: 90.1 fL (ref 80.0–100.0)
Platelets: 142 10*3/uL — ABNORMAL LOW (ref 150–440)
RBC: 3.38 MIL/uL — AB (ref 4.40–5.90)
RDW: 13.9 % (ref 11.5–14.5)
WBC: 2.7 10*3/uL — ABNORMAL LOW (ref 3.8–10.6)

## 2015-10-07 NOTE — Patient Instructions (Signed)
  Your procedure is scheduled on: October 14, 2015 (Friday) Report to Same Day Surgery 2nd floor Medical Mall To find out your arrival time please call [phone] between 1PM - 3PM on October 13, 2015 (Thursday)  Remember: Instructions that are not followed completely may result in serious medical risk, up to and including death, or upon the discretion of your surgeon and anesthesiologist your surgery may need to be rescheduled.    _x___ 1. Do not eat food or drink liquids after midnight. No gum chewing or hard candies.     _x___ 2. No Alcohol for 24 hours before or after surgery.   _x_  __3. No Smoking for 24 prior to surgery.   ____  4. Bring all medications with you on the day of surgery if instructed.    __x__ 5. Notify your doctor if there is any change in your medical condition     (cold, fever, infections).     Do not wear jewelry, make-up, hairpins, clips or nail polish.  Do not wear lotions, powders, or perfumes. You may wear deodorant.  Do not shave 48 hours prior to surgery. Men may shave face and neck.  Do not bring valuables to the hospital.    [facility] is not responsible for any belongings or valuables.               Contacts, dentures or bridgework may not be worn into surgery.  Leave your suitcase in the car. After surgery it may be brought to your room.  For patients admitted to the hospital, discharge time is determined by your treatment team.   Patients discharged the day of surgery will not be allowed to drive home.    Please read over the following fact sheets that you were given:   [facility] Preparing for Surgery and or MRSA Information   _x___ Take these medicines the morning of surgery with A SIP OF WATER:    1. Gabapentin  2. Lovastatin  3. Nebivolol  4.  5.  6.  ____Fleets enema or Magnesium Citrate as directed.   ____ Use CHG Soap or sage wipes as directed on instruction sheet   ____ Use inhalers on the day of surgery and bring to  hospital day of surgery  ____ Stop metformin 2 days prior to surgery    __x__ Take 1/2 of usual insulin dose the night before surgery and none on the morning of surgery--NO INSULIN THE MORNING OF SURGERY AND ONE-HALF OF LEVEMIR INSULIN THE NIGHT BEFORE SURGERY.         __x__ Stop aspirin or coumadin, or plavix (NO ASPIRIN)  x__ Stop Anti-inflammatories such as Advil, Aleve, Ibuprofen, Motrin, Naproxen,          Naprosyn, Goodies powders or aspirin products. Ok to take Tylenol.   ____ Stop supplements until after surgery.    ____ Bring C-Pap to the hospital.

## 2015-10-07 NOTE — Pre-Procedure Instructions (Signed)
Hemogram sent to Dr. Ashby Dawes and Anesthesia for review.

## 2015-10-10 ENCOUNTER — Ambulatory Visit (INDEPENDENT_AMBULATORY_CARE_PROVIDER_SITE_OTHER): Payer: Medicare HMO | Admitting: Pulmonary Disease

## 2015-10-10 ENCOUNTER — Encounter: Payer: Self-pay | Admitting: Pulmonary Disease

## 2015-10-10 VITALS — BP 146/80 | HR 67 | Ht 71.0 in | Wt 184.0 lb

## 2015-10-10 DIAGNOSIS — R918 Other nonspecific abnormal finding of lung field: Secondary | ICD-10-CM

## 2015-10-10 NOTE — Progress Notes (Signed)
PROBLEMS: History of renal cell ca Lung nodules/masses  HISTORY: Recently admitted to hospital with L posterior thoracic pain and CXR revealing suspected mass. CT scan confirmed mass. Seen in consultation by Dr Ashby Dawes. EBUS planned for 10/14/15.   SUBJ: This is a post-hospital follow up. It is not clear why it was scheduled prior to planned EBUS. He has no new complaints and denies dyspnea, CP, fever, purulent sputum, hemoptysis, LE edema and calf tenderness.  OBJ: Vitals:   10/10/15 0911  BP: (!) 146/80  Pulse: 67  SpO2: 98%  Weight: 184 lb (83.5 kg)  Height: '5\' 11"'$  (1.803 m)    Gen: WDWN in NAD HEENT: All WNL Neck: NO LAN, no JVD noted Lungs: full BS, normal percussion note throughout, no adventitious sounds Cardiovascular: Reg rate, normal rhythm, no M noted Abdomen: Soft, NT +BS Ext: no C/C/E  DATA: CXR and CT chest reviewed  IMPRESSION: History of renal cell ca Lung nodules and subcarinal mass  PLAN: EBUS as planned 10/13 I suggested that he call Dr Gary Fleet office to clarify timing of that follow up Follow up with Dr Ashby Dawes to be arranged after EBUS   Merton Border, MD PCCM service Mobile [phone] Pager [phone] 10/10/2015

## 2015-10-10 NOTE — Patient Instructions (Addendum)
Bronchoscopy scheduled for 10/13  Call Dr Grayland Ormond to see if he wants to move your appointment back to after the biopsy  Dr Ashby Dawes will decided on timing of follow up appointment after bronchoscopy

## 2015-10-11 DIAGNOSIS — N183 Chronic kidney disease, stage 3 (moderate): Secondary | ICD-10-CM | POA: Diagnosis not present

## 2015-10-11 DIAGNOSIS — E871 Hypo-osmolality and hyponatremia: Secondary | ICD-10-CM | POA: Diagnosis not present

## 2015-10-11 DIAGNOSIS — N179 Acute kidney failure, unspecified: Secondary | ICD-10-CM | POA: Diagnosis not present

## 2015-10-11 DIAGNOSIS — D631 Anemia in chronic kidney disease: Secondary | ICD-10-CM | POA: Diagnosis not present

## 2015-10-12 NOTE — Progress Notes (Signed)
[facility]  [phone] [phone]  ID: Rodney Stewart OB: [birth year]  MR#: [id number]  ZHG#:[id number]  Patient Care Team: No Pcp Per Patient as PCP - General (General Practice)  CHIEF COMPLAINT: Stage IV renal cell carcinoma with bilateral lung metastases.  INTERVAL HISTORY: Patient returns to clinic today for further evaluation, treatment planning, and hospital follow-up.  He is a 60 year old male with a history of renal cell carcinoma status post nephrectomy in May 2014. He recently presented to the emergency room with "shoulder blade pain and acute bronchitis". Upon evaluation emergency room, he was noted to have acute renal failure with a creatinine greater than 3.5. CT scan of the chest revealed multiple bilateral pulmonary lesions as well as mediastinal lymphadenopathy highly concerning for recurrent metastatic disease. Currently, patient feels well and is back to his baseline. He has no neurologic complaints. He denies any fevers. He has a fair appetite, but denies weight loss. He denies any chest pain, cough, shortness of breath, or hemoptysis. He has no nausea, vomiting, constipation, or diarrhea. He has no melena or hematochezia. He has no urinary complaints. Patient otherwise feels well and offers no further specific complaints.   REVIEW OF SYSTEMS:   Review of Systems  Constitutional: Negative.  Negative for fever and malaise/fatigue.  Respiratory: Negative.  Negative for cough and shortness of breath.   Cardiovascular: Negative.  Negative for chest pain and leg swelling.  Gastrointestinal: Negative.  Negative for abdominal pain, constipation, diarrhea, nausea and vomiting.  Genitourinary: Negative.   Musculoskeletal: Negative.   Neurological: Negative.  Negative for weakness.  Psychiatric/Behavioral: Negative.  The patient is not nervous/anxious.     As per HPI. Otherwise, a complete review of systems is negative.  PAST MEDICAL  HISTORY: Past Medical History:  Diagnosis Date  . Anemia   . BPH (benign prostatic hyperplasia)   . CKD (chronic kidney disease) stage 3, GFR 30-59 ml/min   . Clear cell renal cell carcinoma (HCC)   . Diabetes mellitus without complication ([place])   . Dyspnea    with exertion  . GERD (gastroesophageal reflux disease)   . History of gout   . History of nephrectomy    Left  . Hypertension   . Neuropathy ([place])   . Umbilical hernia     PAST SURGICAL HISTORY: Past Surgical History:  Procedure Laterality Date  . BACK SURGERY    . EYE SURGERY Bilateral    Cataract Extraction with IOL  . KNEE SURGERY Right   . NEPHRECTOMY      FAMILY HISTORY: Family History  Problem Relation Age of Onset  . Cancer Mother   . Diabetes Father   . Hypertension Father     ADVANCED DIRECTIVES (Y/N):  N  HEALTH MAINTENANCE: Social History  Substance Use Topics  . Smoking status: Former Smoker    Packs/day: 1.00    Types: Cigarettes  . Smokeless tobacco: Never Used  . Alcohol use 25.2 oz/week    42 Cans of beer per week     Comment: 5-6 cans a day     Colonoscopy:  PAP:  Bone density:  Lipid panel:  No Known Allergies  No current facility-administered medications for this visit.    No current outpatient prescriptions on file.   Facility-Administered Medications Ordered in Other Visits  Medication Dose Route Frequency Provider Last Rate Last Dose  . 0.9 %  sodium chloride infusion   Intravenous Continuous Amy Penwarden, MD 50 mL/hr at 10/14/15 1228    .  butamben-tetracaine-benzocaine (CETACAINE) spray 1 spray  1 spray Topical Once [name], MD      . ePHEDrine injection   Intravenous Anesthesia Intra-op Darlyne Russian, CRNA   10 mg at 10/14/15 1343  . famotidine (PEPCID) 20 MG tablet           . fentaNYL (SUBLIMAZE) injection    Anesthesia Intra-op Darlyne Russian, CRNA   100 mcg at 10/14/15 1254  . ipratropium-albuterol (DUONEB) 0.5-2.5 (3) MG/3ML nebulizer solution            . ipratropium-albuterol (DUONEB) 0.5-2.5 (3) MG/3ML nebulizer solution           . lactated ringers infusion    Continuous PRN Darlyne Russian, CRNA      . lidocaine (cardiac) 100 mg/51m (XYLOCAINE) 20 MG/ML injection 2%   Intravenous Anesthesia Intra-op SDarlyne Russian CRNA   50 mg at 10/14/15 1258  . lidocaine (XYLOCAINE) 2 % jelly 1 application  1 application Topical Once PLaverle Hobby MD      . midazolam (VERSED) injection    Anesthesia Intra-op SDarlyne Russian CRNA   2 mg at 10/14/15 1254  . phenylephrine (NEO-SYNEPHRINE) 0.25 % nasal spray 1 spray  1 spray Each Nare Q6H PRN PLaverle Hobby MD      . propofol (DIPRIVAN) 10 mg/mL bolus/IV push    Anesthesia Intra-op SDarlyne Russian CRNA   200 mg at 10/14/15 1258  . rocuronium ([facility] injection    Anesthesia Intra-op SDarlyne Russian CRNA   10 mg at 10/14/15 1339    OBJECTIVE: Vitals:   10/13/15 0907  BP: (!) 165/87  Pulse: 67  Resp: 18  Temp: (!) 96.8 F (36 C)     Body mass index is 25.21 kg/m.    ECOG FS:0 - Asymptomatic  General: Well-developed, well-nourished, no acute distress. Eyes: Pink conjunctiva, anicteric sclera. HEENT: Normocephalic, moist mucous membranes, clear oropharnyx. Lungs: Clear to auscultation bilaterally. Heart: Regular rate and rhythm. No rubs, murmurs, or gallops. Abdomen: Soft, nontender, nondistended. No organomegaly noted, normoactive bowel sounds. Musculoskeletal: No edema, cyanosis, or clubbing. Neuro: Alert, answering all questions appropriately. Cranial nerves grossly intact. Skin: No rashes or petechiae noted. Psych: Normal affect. Lymphatics: No cervical, calvicular, axillary or inguinal LAD.   LAB RESULTS:  Lab Results  Component Value Date   NA 135 10/03/2015   K 4.4 10/03/2015   CL 103 10/03/2015   CO2 26 10/03/2015   GLUCOSE 72 10/03/2015   BUN 22 (H) 10/03/2015   CREATININE 2.28 (H) 10/03/2015   CALCIUM 8.2 (L) 10/03/2015   PROT 7.4 09/30/2015   ALBUMIN 3.3 (L)  09/30/2015   AST 46 (H) 09/30/2015   ALT 28 09/30/2015   ALKPHOS 231 (H) 09/30/2015   BILITOT 1.5 (H) 09/30/2015   GFRNONAA 30 (L) 10/03/2015   GFRAA 34 (L) 10/03/2015    Lab Results  Component Value Date   WBC 2.7 (L) 10/07/2015   NEUTROABS 2.2 09/30/2015   HGB 10.0 (L) 10/07/2015   HCT 30.4 (L) 10/07/2015   MCV 90.1 10/07/2015   PLT 142 (L) 10/07/2015     STUDIES: Ct Abdomen Pelvis Wo Contrast  Result Date: 10/02/2015 CLINICAL DATA:  60year old male with history of renal cell carcinoma status post left nephrectomy in May 2014. Multiple pulmonary nodules and mediastinal lymphadenopathy noted on recent chest CT. Possible recurrent metastatic disease. EXAM: CT ABDOMEN AND PELVIS WITHOUT CONTRAST TECHNIQUE: Multidetector CT imaging of the abdomen and pelvis was performed following the standard protocol without IV contrast. COMPARISON:  CT the  abdomen and pelvis 05/09/2010. FINDINGS: Lower chest: Multiple pulmonary nodules are again noted throughout the visualize lung bases bilaterally, the largest of which measures 20 x 19 mm in the left lower lobe (image 4 of series 3). Hepatobiliary: Mild diffuse decreased attenuation throughout the hepatic parenchyma, compatible with hepatic steatosis. No discrete cystic or solid hepatic lesions are confidently identified on today's noncontrast CT examination. Liver early nodular contour, suggestive of underlying cirrhosis. Several calcified gallstones are noted in the neck of the gallbladder. Gallbladder does not appear distended. Pancreas: No definite pancreatic mass or peripancreatic inflammatory changes. Spleen: The spleen is enlarged measuring 16.8 x 8.2 x 18.9 cm (estimated splenic volume of 1,302 mL). Adrenals/Urinary Tract: Status post left radical nephrectomy. No definite mass in the nephrectomy bed to suggest local recurrence of disease. Trace volume of free fluid in the nephrectomy bed, adjacent and multiple loops of small bowel. Unenhanced  appearance of the right kidney is normal. Right adrenal gland is normal in appearance. Left adrenal gland is not confidently identified and may have been surgically resected. No right-sided hydroureteronephrosis. Urinary bladder is normal in appearance. Stomach/Bowel: The appearance of the stomach is normal. There is no pathologic dilatation of small bowel or colon. Normal appendix. Vascular/Lymphatic: Aortic atherosclerosis. Dilatation of the portal vein (2.1 cm in diameter). No definite aneurysm identified in the abdominal or pelvic vasculature. No lymphadenopathy noted in the abdomen or pelvis. Reproductive: Prostate gland and seminal vesicles are unremarkable in appearance. Other: Small volume of ascites, most evident adjacent to the liver and spleen. No pneumoperitoneum. Musculoskeletal: There are no aggressive appearing lytic or blastic lesions noted in the visualized portions of the skeleton. IMPRESSION: 1. No definite evidence to suggest local recurrence of disease in the nephrectomy bed. No definite signs of metastatic disease in the abdomen or pelvis on today's noncontrast CT examination. 2. Multiple pulmonary nodules are again noted throughout the lung bases bilaterally, similar to the recent chest CT, concerning for metastatic disease. However, the possibility of a primary left lower lobe neoplasm with metastatic disease to the lungs and mediastinum is not excluded, and correlation with biopsy to establish a tissue diagnosis is suggested if clinically appropriate. 3. Hepatic steatosis with morphologic changes in the liver suggestive of underlying cirrhosis. This is associated with dilatation of the portal vein (2.1 cm in diameter) and splenomegaly, suggestive of underlying portal hypertension. In addition, there is a small volume of ascites. 4. Cholelithiasis, without definitive evidence to suggest acute cholecystitis at this time. 5. Aortic atherosclerosis. Electronically Signed   By: Vinnie Langton  M.D.   On: 10/02/2015 13:30   Dg Chest 2 View  Result Date: 09/30/2015 CLINICAL DATA:  Chest pain radiating. left shoulder pain. EXAM: CHEST  2 VIEW FINDINGS: Mediastinum is unremarkable. Left hilar mass is noted. Although this could be vascular, adenopathy or malignancy cannot be excluded. Questionable pulmonary nodule in the right upper lobe. IV contrast-enhanced chest CT suggest for further evaluation these findings. Heart size normal. No pleural effusion or pneumothorax. No acute bony abnormality. IMPRESSION: 1. Left hilar fullness/mass. 2. Questionable right upper lobe pulmonary nodule. IV contrast-enhanced chest CT is suggested for further evaluation of these findings. Electronically Signed   By: Marcello Moores  Register   On: 09/30/2015 11:26   Dg Scapula Left  Result Date: 10/01/2015 CLINICAL DATA:  Left posterior back pain near scapula for 1 week after heavy lifting. EXAM: LEFT SCAPULA - 2+ VIEWS COMPARISON:  CT scan from yesterday FINDINGS: There is no evidence of fracture or other  focal bone lesions. Soft tissues are unremarkable. IMPRESSION: Negative. Electronically Signed   By: Dorise Bullion III M.D   On: 10/01/2015 13:56   Ct Chest Wo Contrast  Result Date: 09/30/2015 CLINICAL DATA:  60 year old male with history of renal cancer presents for evaluation of a questionable left hilar mass on chest radiograph from earlier today. EXAM: CT CHEST WITHOUT CONTRAST TECHNIQUE: Multidetector CT imaging of the chest was performed following the standard protocol without IV contrast. COMPARISON:  Chest radiograph from earlier today. No prior chest CT. 05/09/2010 CT abdomen/pelvis. FINDINGS: Cardiovascular: Normal heart size. No significant pericardial fluid/thickening. Left main, left anterior descending, left circumflex and right coronary atherosclerosis. Atherosclerotic nonaneurysmal thoracic aorta. Normal caliber pulmonary arteries. Mediastinum/Nodes: No discrete thyroid nodules. Unremarkable esophagus. No  axillary adenopathy. Bulky 4.2 cm subcarinal node (series 2/ image 73). Enlarged 1.8 cm left hilar node (series 2/ image 71). Enlarged 1.5 cm right hilar node (series 2/ image 69). No additional pathologically enlarged mediastinal or gross hilar nodes on this noncontrast study. Lungs/Pleura: No pneumothorax. No pleural effusion. There are greater than 20 solid pulmonary nodules of various size randomly distributed throughout both lungs involving all lung lobes, largest 2.4 cm in the left lower lobe (series 4/ image 98), 1.7 cm in the right upper lobe (series 4/ image 47) and 0.8 cm in the left upper lobe (series 4/ image 35). No acute consolidative airspace disease. Upper abdomen: Liver surface is diffusely irregular indicating cirrhosis. Small volume perihepatic ascites. Cholelithiasis. Splenomegaly. Musculoskeletal: No aggressive appearing focal osseous lesions. Mild thoracic spondylosis. IMPRESSION: 1. Bulky subcarinal and mild bilateral hilar lymphadenopathy, most consistent with metastatic disease given the reported history of renal cancer. 2. Innumerable (> than 20) pulmonary nodules randomly distributed throughout both lungs measuring up to 2.4 cm in the left lower lobe, most consistent with pulmonary metastases given the reported history of renal cancer . 3. Cirrhosis.  Small volume perihepatic ascites.  Splenomegaly. 4. Additional findings include aortic atherosclerosis, left main and 3 vessel coronary atherosclerosis and cholelithiasis. Electronically Signed   By: Ilona Sorrel M.D.   On: 09/30/2015 13:09   US Renal  Result Date: 09/30/2015 CLINICAL DATA:  Lung mass status post renal cancer EXAM: RENAL / URINARY TRACT ULTRASOUND COMPLETE COMPARISON:  CT abdomen 05/09/2010 FINDINGS: Right Kidney: Length: 11.4 cm. Echogenicity within normal limits. No mass or hydronephrosis visualized. Left Kidney: Surgically absent. Bladder: Appears normal for degree of bladder distention. Other:  Splenic enlargement  measuring 910 mL. IMPRESSION: 1. Normal right kidney. 2. Surgically absent left kidney. 3. Splenomegaly. Electronically Signed   By: Kathreen Devoid   On: 09/30/2015 13:32    ASSESSMENT: Stage IV left renal cell carcinoma with metastasis to the lungs  PLAN:    1. Stage IV left renal cell carcinoma with metastasis to the lungs:  CT scan results reviewed independently and reported as above highly suspicious for metastatic disease in the lung only. Patient underwent left nephrectomy on May 16, 2012 which revealed a clear cell grade 2 renal cell carcinoma, stage TIIIa, N0, M0. Tumor size of 16 cm. Patient was noted to have renal vein involvement, but no other structures were involved. 0 of 2 lymph nodes were negative for disease. He has a bronchoscopy scheduled on October 13, 2015 for biopsy confirmation of malignancy. Will get a PET scan to complete staging workup and then patient will return to clinic in 2 weeks for further evaluation and initiation of treatment. Plan on treating with Votrient 800 mg daily which does  not require dose reduction for renal insufficiency. 2. Acute renal failure: Patient's creatinine remains elevated, but improved since admission. Monitor 3. Anemia: Likely multifactorial, monitor. 4. Thrombocytopenia: Unclear patient's baseline, will continue to monitor. 5. Leukopenia: Unclear etiology, monitor.  Patient expressed understanding and was in agreement with this plan. He also understands that He can call clinic at any time with any questions, concerns, or complaints.   Metastatic renal cell carcinoma to lung [facility])   Staging form: Kidney, AJCC 7th Edition   - Clinical stage from 10/12/2015: Stage IV (TX, N0, M1) - Signed by Rodney Huger, MD on 10/12/2015  Rodney Huger, MD   10/14/2015 1:55 PM

## 2015-10-13 ENCOUNTER — Encounter: Payer: Self-pay | Admitting: Oncology

## 2015-10-13 ENCOUNTER — Inpatient Hospital Stay: Payer: Medicare HMO | Attending: Oncology | Admitting: Oncology

## 2015-10-13 ENCOUNTER — Telehealth: Payer: Self-pay | Admitting: Internal Medicine

## 2015-10-13 VITALS — BP 165/87 | HR 67 | Temp 96.8°F | Resp 18 | Wt 180.8 lb

## 2015-10-13 DIAGNOSIS — K760 Fatty (change of) liver, not elsewhere classified: Secondary | ICD-10-CM | POA: Insufficient documentation

## 2015-10-13 DIAGNOSIS — D631 Anemia in chronic kidney disease: Secondary | ICD-10-CM | POA: Diagnosis not present

## 2015-10-13 DIAGNOSIS — K7460 Unspecified cirrhosis of liver: Secondary | ICD-10-CM | POA: Insufficient documentation

## 2015-10-13 DIAGNOSIS — K219 Gastro-esophageal reflux disease without esophagitis: Secondary | ICD-10-CM | POA: Diagnosis not present

## 2015-10-13 DIAGNOSIS — C7802 Secondary malignant neoplasm of left lung: Secondary | ICD-10-CM

## 2015-10-13 DIAGNOSIS — E1122 Type 2 diabetes mellitus with diabetic chronic kidney disease: Secondary | ICD-10-CM | POA: Insufficient documentation

## 2015-10-13 DIAGNOSIS — M47814 Spondylosis without myelopathy or radiculopathy, thoracic region: Secondary | ICD-10-CM | POA: Insufficient documentation

## 2015-10-13 DIAGNOSIS — D696 Thrombocytopenia, unspecified: Secondary | ICD-10-CM | POA: Insufficient documentation

## 2015-10-13 DIAGNOSIS — Z87891 Personal history of nicotine dependence: Secondary | ICD-10-CM | POA: Insufficient documentation

## 2015-10-13 DIAGNOSIS — C642 Malignant neoplasm of left kidney, except renal pelvis: Secondary | ICD-10-CM | POA: Insufficient documentation

## 2015-10-13 DIAGNOSIS — K8020 Calculus of gallbladder without cholecystitis without obstruction: Secondary | ICD-10-CM | POA: Diagnosis not present

## 2015-10-13 DIAGNOSIS — Z905 Acquired absence of kidney: Secondary | ICD-10-CM | POA: Insufficient documentation

## 2015-10-13 DIAGNOSIS — Z79899 Other long term (current) drug therapy: Secondary | ICD-10-CM | POA: Diagnosis not present

## 2015-10-13 DIAGNOSIS — C7801 Secondary malignant neoplasm of right lung: Secondary | ICD-10-CM | POA: Diagnosis not present

## 2015-10-13 DIAGNOSIS — D72819 Decreased white blood cell count, unspecified: Secondary | ICD-10-CM | POA: Insufficient documentation

## 2015-10-13 DIAGNOSIS — N400 Enlarged prostate without lower urinary tract symptoms: Secondary | ICD-10-CM | POA: Diagnosis not present

## 2015-10-13 DIAGNOSIS — M109 Gout, unspecified: Secondary | ICD-10-CM | POA: Diagnosis not present

## 2015-10-13 DIAGNOSIS — N179 Acute kidney failure, unspecified: Secondary | ICD-10-CM | POA: Insufficient documentation

## 2015-10-13 DIAGNOSIS — I129 Hypertensive chronic kidney disease with stage 1 through stage 4 chronic kidney disease, or unspecified chronic kidney disease: Secondary | ICD-10-CM | POA: Insufficient documentation

## 2015-10-13 DIAGNOSIS — C649 Malignant neoplasm of unspecified kidney, except renal pelvis: Secondary | ICD-10-CM

## 2015-10-13 MED ORDER — PAZOPANIB HCL 200 MG PO TABS: 800.0000 mg | ORAL_TABLET | Freq: Every day | ORAL | 5 refills | Status: DC

## 2015-10-13 NOTE — Progress Notes (Signed)
New evaluation for renal cell carcinoma with mets to lung. Scheduled for EBUS tomorrow. Offers no complaints today.

## 2015-10-13 NOTE — Telephone Encounter (Signed)
I have spoke with Langley Gauss and made her aware that DR will place these orders. (per DR verbally) Nothing further needed.

## 2015-10-13 NOTE — Telephone Encounter (Signed)
Needs order for procedure for tomorrow.

## 2015-10-14 ENCOUNTER — Ambulatory Visit: Payer: Medicare HMO

## 2015-10-14 ENCOUNTER — Ambulatory Visit: Payer: Medicare HMO | Admitting: Anesthesiology

## 2015-10-14 ENCOUNTER — Ambulatory Visit
Admission: RE | Admit: 2015-10-14 | Discharge: 2015-10-14 | Disposition: A | Discharge status: Home / Self Care | Payer: Medicare HMO | Source: Ambulatory Visit | Attending: Internal Medicine | Admitting: Internal Medicine

## 2015-10-14 ENCOUNTER — Encounter
Admission: RE | Disposition: A | Discharge status: Home / Self Care | Payer: Self-pay | Source: Ambulatory Visit | Attending: Internal Medicine

## 2015-10-14 ENCOUNTER — Encounter: Payer: Self-pay | Admitting: *Deleted

## 2015-10-14 DIAGNOSIS — R911 Solitary pulmonary nodule: Secondary | ICD-10-CM | POA: Diagnosis not present

## 2015-10-14 DIAGNOSIS — E1140 Type 2 diabetes mellitus with diabetic neuropathy, unspecified: Secondary | ICD-10-CM | POA: Diagnosis not present

## 2015-10-14 DIAGNOSIS — C7800 Secondary malignant neoplasm of unspecified lung: Secondary | ICD-10-CM

## 2015-10-14 DIAGNOSIS — Z905 Acquired absence of kidney: Secondary | ICD-10-CM | POA: Insufficient documentation

## 2015-10-14 DIAGNOSIS — I129 Hypertensive chronic kidney disease with stage 1 through stage 4 chronic kidney disease, or unspecified chronic kidney disease: Secondary | ICD-10-CM | POA: Insufficient documentation

## 2015-10-14 DIAGNOSIS — N179 Acute kidney failure, unspecified: Secondary | ICD-10-CM | POA: Insufficient documentation

## 2015-10-14 DIAGNOSIS — Z85528 Personal history of other malignant neoplasm of kidney: Secondary | ICD-10-CM | POA: Diagnosis not present

## 2015-10-14 DIAGNOSIS — N400 Enlarged prostate without lower urinary tract symptoms: Secondary | ICD-10-CM | POA: Insufficient documentation

## 2015-10-14 DIAGNOSIS — R918 Other nonspecific abnormal finding of lung field: Secondary | ICD-10-CM | POA: Diagnosis not present

## 2015-10-14 DIAGNOSIS — C7802 Secondary malignant neoplasm of left lung: Secondary | ICD-10-CM | POA: Diagnosis not present

## 2015-10-14 DIAGNOSIS — N183 Chronic kidney disease, stage 3 (moderate): Secondary | ICD-10-CM | POA: Diagnosis not present

## 2015-10-14 DIAGNOSIS — Z87891 Personal history of nicotine dependence: Secondary | ICD-10-CM | POA: Insufficient documentation

## 2015-10-14 DIAGNOSIS — R590 Localized enlarged lymph nodes: Secondary | ICD-10-CM | POA: Diagnosis present

## 2015-10-14 DIAGNOSIS — K7460 Unspecified cirrhosis of liver: Secondary | ICD-10-CM | POA: Diagnosis not present

## 2015-10-14 DIAGNOSIS — Z79899 Other long term (current) drug therapy: Secondary | ICD-10-CM | POA: Diagnosis not present

## 2015-10-14 DIAGNOSIS — J449 Chronic obstructive pulmonary disease, unspecified: Secondary | ICD-10-CM | POA: Insufficient documentation

## 2015-10-14 DIAGNOSIS — Z7984 Long term (current) use of oral hypoglycemic drugs: Secondary | ICD-10-CM | POA: Insufficient documentation

## 2015-10-14 DIAGNOSIS — E1122 Type 2 diabetes mellitus with diabetic chronic kidney disease: Secondary | ICD-10-CM | POA: Insufficient documentation

## 2015-10-14 DIAGNOSIS — K760 Fatty (change of) liver, not elsewhere classified: Secondary | ICD-10-CM | POA: Insufficient documentation

## 2015-10-14 DIAGNOSIS — R69 Illness, unspecified: Secondary | ICD-10-CM | POA: Diagnosis not present

## 2015-10-14 DIAGNOSIS — F1010 Alcohol abuse, uncomplicated: Secondary | ICD-10-CM | POA: Insufficient documentation

## 2015-10-14 DIAGNOSIS — R599 Enlarged lymph nodes, unspecified: Secondary | ICD-10-CM | POA: Diagnosis not present

## 2015-10-14 DIAGNOSIS — R0600 Dyspnea, unspecified: Secondary | ICD-10-CM

## 2015-10-14 DIAGNOSIS — Z9889 Other specified postprocedural states: Secondary | ICD-10-CM

## 2015-10-14 DIAGNOSIS — C771 Secondary and unspecified malignant neoplasm of intrathoracic lymph nodes: Secondary | ICD-10-CM | POA: Diagnosis not present

## 2015-10-14 DIAGNOSIS — C649 Malignant neoplasm of unspecified kidney, except renal pelvis: Secondary | ICD-10-CM

## 2015-10-14 DIAGNOSIS — M109 Gout, unspecified: Secondary | ICD-10-CM | POA: Insufficient documentation

## 2015-10-14 HISTORY — PX: ENDOBRONCHIAL ULTRASOUND: SHX5096

## 2015-10-14 LAB — GLUCOSE, CAPILLARY
Glucose-Capillary: 181 mg/dL — ABNORMAL HIGH (ref 65–99)
Glucose-Capillary: 177 mg/dL — ABNORMAL HIGH (ref 65–99)

## 2015-10-14 SURGERY — ENDOBRONCHIAL ULTRASOUND (EBUS)
Anesthesia: General

## 2015-10-14 MED ORDER — FAMOTIDINE 20 MG PO TABS
20.0000 mg | ORAL_TABLET | Freq: Once | ORAL | Status: AC
  Administered 2015-10-14: 20 mg via ORAL

## 2015-10-14 MED ORDER — SUGAMMADEX SODIUM 200 MG/2ML IV SOLN
INTRAVENOUS | Status: DC | PRN
  Administered 2015-10-14: 200 mg via INTRAVENOUS

## 2015-10-14 MED ORDER — FAMOTIDINE 20 MG PO TABS
ORAL_TABLET | ORAL | Status: DC
Start: 2015-10-14 — End: 2015-10-14
  Filled 2015-10-14: qty 1

## 2015-10-14 MED ORDER — LIDOCAINE HCL 2 % EX GEL: 1.0000 "application " | Freq: Once | CUTANEOUS | Status: DC

## 2015-10-14 MED ORDER — FENTANYL CITRATE (PF) 100 MCG/2ML IJ SOLN: 25.0000 ug | INTRAMUSCULAR | Status: DC | PRN

## 2015-10-14 MED ORDER — ONDANSETRON HCL 4 MG/2ML IJ SOLN
INTRAMUSCULAR | Status: DC | PRN
  Administered 2015-10-14: 4 mg via INTRAVENOUS

## 2015-10-14 MED ORDER — PHENYLEPHRINE HCL 0.25 % NA SOLN
1.0000 | Freq: Four times a day (QID) | NASAL | Status: DC | PRN
  Filled 2015-10-14: qty 15

## 2015-10-14 MED ORDER — SODIUM CHLORIDE 0.9 % IV SOLN
INTRAVENOUS | Status: DC
  Administered 2015-10-14: 12:00:00 via INTRAVENOUS

## 2015-10-14 MED ORDER — FENTANYL CITRATE (PF) 100 MCG/2ML IJ SOLN
INTRAMUSCULAR | Status: DC | PRN
  Administered 2015-10-14: 100 ug via INTRAVENOUS

## 2015-10-14 MED ORDER — IPRATROPIUM-ALBUTEROL 0.5-2.5 (3) MG/3ML IN SOLN
3.0000 mL | Freq: Once | RESPIRATORY_TRACT | Status: AC
  Administered 2015-10-14: 3 mL via RESPIRATORY_TRACT

## 2015-10-14 MED ORDER — PROPOFOL 10 MG/ML IV BOLUS
INTRAVENOUS | Status: DC | PRN
  Administered 2015-10-14: 200 mg via INTRAVENOUS

## 2015-10-14 MED ORDER — OXYCODONE HCL 5 MG PO TABS: 5.0000 mg | ORAL_TABLET | Freq: Once | ORAL | Status: DC | PRN

## 2015-10-14 MED ORDER — LIDOCAINE HCL (CARDIAC) 20 MG/ML IV SOLN
INTRAVENOUS | Status: DC | PRN
  Administered 2015-10-14: 50 mg via INTRAVENOUS

## 2015-10-14 MED ORDER — BUTAMBEN-TETRACAINE-BENZOCAINE 2-2-14 % EX AERO
1.0000 | INHALATION_SPRAY | Freq: Once | CUTANEOUS | Status: DC
  Filled 2015-10-14: qty 20

## 2015-10-14 MED ORDER — OXYCODONE HCL 5 MG/5ML PO SOLN: 5.0000 mg | Freq: Once | ORAL | Status: DC | PRN

## 2015-10-14 MED ORDER — IPRATROPIUM-ALBUTEROL 0.5-2.5 (3) MG/3ML IN SOLN
RESPIRATORY_TRACT | Status: AC
  Filled 2015-10-14: qty 3

## 2015-10-14 MED ORDER — EPHEDRINE SULFATE 50 MG/ML IJ SOLN
INTRAMUSCULAR | Status: DC | PRN
  Administered 2015-10-14 (×2): 10 mg via INTRAVENOUS

## 2015-10-14 MED ORDER — MIDAZOLAM HCL 2 MG/2ML IJ SOLN
INTRAMUSCULAR | Status: DC | PRN
  Administered 2015-10-14: 2 mg via INTRAVENOUS

## 2015-10-14 MED ORDER — ROCURONIUM BROMIDE 100 MG/10ML IV SOLN
INTRAVENOUS | Status: DC | PRN
  Administered 2015-10-14: 40 mg via INTRAVENOUS
  Administered 2015-10-14: 10 mg via INTRAVENOUS

## 2015-10-14 MED ORDER — LACTATED RINGERS IV SOLN
INTRAVENOUS | Status: DC | PRN
  Administered 2015-10-14: 13:00:00 via INTRAVENOUS

## 2015-10-14 NOTE — Anesthesia Preprocedure Evaluation (Signed)
Anesthesia Evaluation  Patient identified by MRN, date of birth, ID band Patient awake    Reviewed: Allergy & Precautions, H&P , NPO status , Patient's Chart, lab work & pertinent test results  History of Anesthesia Complications Negative for: history of anesthetic complications  Airway Mallampati: I  TM Distance: <3 FB Neck ROM: limited    Dental no notable dental hx. (+) Poor Dentition, Chipped   Pulmonary neg shortness of breath, COPD, former smoker,    Pulmonary exam normal breath sounds clear to auscultation       Cardiovascular Exercise Tolerance: Good hypertension, (-) angina(-) Past MI and (-) DOE Normal cardiovascular exam Rhythm:regular Rate:Normal     Neuro/Psych negative neurological ROS  negative psych ROS   GI/Hepatic negative GI ROS, Neg liver ROS, GERD  Controlled,  Endo/Other  diabetes, Type 2  Renal/GU Renal disease     Musculoskeletal   Abdominal   Peds  Hematology negative hematology ROS (+)   Anesthesia Other Findings Past Medical History: No date: Anemia No date: BPH (benign prostatic hyperplasia) No date: CKD (chronic kidney disease) stage 3, GFR 30-5* No date: Clear cell renal cell carcinoma (HCC) No date: Diabetes mellitus without complication (HCC) No date: Dyspnea     Comment: with exertion No date: GERD (gastroesophageal reflux disease) No date: History of gout No date: History of nephrectomy     Comment: Left No date: Hypertension No date: Neuropathy (HCC) No date: Umbilical hernia  Past Surgical History: No date: BACK SURGERY No date: EYE SURGERY Bilateral     Comment: Cataract Extraction with IOL No date: KNEE SURGERY Right No date: NEPHRECTOMY     Reproductive/Obstetrics negative OB ROS                             Anesthesia Physical Anesthesia Plan  ASA: III  Anesthesia Plan: General ETT   Post-op Pain Management:    Induction:    Airway Management Planned:   Additional Equipment:   Intra-op Plan:   Post-operative Plan:   Informed Consent: I have reviewed the patients History and Physical, chart, labs and discussed the procedure including the risks, benefits and alternatives for the proposed anesthesia with the patient or authorized representative who has indicated his/her understanding and acceptance.     Plan Discussed with: Anesthesiologist, CRNA and Surgeon  Anesthesia Plan Comments:         Anesthesia Quick Evaluation

## 2015-10-14 NOTE — Anesthesia Procedure Notes (Signed)
Procedure Name: Intubation Date/Time: 10/14/2015 1:00 PM Performed by: Darlyne Russian Pre-anesthesia Checklist: Patient identified, Emergency Drugs available, Suction available and Patient being monitored Patient Re-evaluated:Patient Re-evaluated prior to inductionOxygen Delivery Method: Circle system utilized Preoxygenation: Pre-oxygenation with 100% oxygen Intubation Type: IV induction Ventilation: Mask ventilation without difficulty Laryngoscope Size: Mac and 4 Grade View: Grade I Tube type: Oral Number of attempts: 1 Airway Equipment and Method: Stylet Placement Confirmation: ETT inserted through vocal cords under direct vision,  positive ETCO2 and breath sounds checked- equal and bilateral Secured at: 22 (repositioned to 20 per surgeon request at 1318) cm Tube secured with: Tape Dental Injury: Teeth and Oropharynx as per pre-operative assessment

## 2015-10-14 NOTE — Interval H&P Note (Signed)
History and Physical Interval Note:  10/14/2015 12:32 PM  Rodney Stewart  has presented today for surgery, with the diagnosis of lung nodule  The various methods of treatment have been discussed with the patient and family. After consideration of risks, benefits and other options for treatment, the patient has consented to  Procedure(s): ENDOBRONCHIAL ULTRASOUND (N/A) as a surgical intervention .  The patient's history has been reviewed, patient examined, no change in status, stable for surgery.  I have reviewed the patient's chart and labs.  Questions were answered to the patient's satisfaction.     [name]

## 2015-10-14 NOTE — Discharge Instructions (Signed)
In the first 24 hours after the procedure you may have fevers, chills, and may cough up some blood, this is normal in the first 24 hours. You may take tylenol (acetaminophen) for fever or chills. If the fever or the coughing up blood is not improving in 24 hours, please call our office as you may need an antibiotic.  If you develop sudden chest pain or severe breathing difficulty, go to the ER or call 911.  AMBULATORY SURGERY  DISCHARGE INSTRUCTIONS   1) The drugs that you were given will stay in your system until tomorrow so for the next 24 hours you should not:  A) Drive an automobile B) Make any legal decisions C) Drink any alcoholic beverage   2) You may resume regular meals tomorrow.  Today it is better to start with liquids and gradually work up to solid foods.  You may eat anything you prefer, but it is better to start with liquids, then soup and crackers, and gradually work up to solid foods.   3) Please notify your doctor immediately if you have any unusual bleeding, trouble breathing, redness and pain at the surgery site, drainage, fever, or pain not relieved by medication.    4) Additional Instructions:        Please contact your physician with any problems or Same Day Surgery at [phone], Monday through Friday 6 am to 4 pm, or [place] at [facility] number at [phone].

## 2015-10-14 NOTE — Op Note (Signed)
  [facility]            Bronchoscopy Note   FINDINGS/SUMMARY:   -Significantly enlarged right and left hilar nodes as well as subcarinal nodes. EBUS guided biopsies were taken of these 3 stations with good returns. -Bronchoalveolar lavage was taken of the right middle lobe and the lingula, results sent for cytology and microbiology. -Transbronchial cytology brush taken of the left lower lobe. -Moderate erythema throughout both lungs with moderate mucosal secretions which were easily suctioned. No endobronchial lesions were noted.   Indication: Mediastinal lymphadenopathy, in the setting of history of renal cell cancer status post left nephrectomy in 2016.  The patient (or their representative) was informed of the risks (including but not limited to bleeding, infection, respiratory failure, lung injury, tooth/oral injury) and benefits of the procedure and gave consent, see chart.   Pre-op diagnosis: Mediastinal lymphadenopathy Post-op diagnosis: Same Estimated blood loss: 20 mL  Medications for procedure: See anesthesia note  Procedure description:  After obtaining informed consent, a timeout was called to confirm the patient and the procedure. Sedation was provided by anesthesia services. Please see their note for further details. The EBUS scope was passed via the endotracheal tube to the main carina. The subcarinal node was visualized first. This was a very large Blodgett's note, greater than 4-1/2 cm in diameter. 3 passes were made with good returns. Neck scope was taken to the right hilar station, this is a smaller node with an approximate 1.5 cm size, one pass was made with good returns. The node had a adjacent vessel. Therefore, another past was not made. Next, the EBUS scope was taken to the left bronchus intermedius, a left hilar node was found which was fairly large, greater than 3 cm across. 2 passes were made with good returns. Subsequently, the EBUS scope was  removed. The regular white light bronchoscope was passed and anatomical tour was undertaken. There were no endobronchial lesions seen. It was some narrowing at the left lower lobe bronchus due to external compression. The mucosa was erythematous throughout both lungs with moderate mucosal secretions which were easily suctioned. The bronchoscope was taken to the right middle lobe and wedged, BAL was performed, aspirate was sent for cytology and micro. Bronchoscope was then taken to the left lower lobe, transbronchial cytology brushing was taken from here. Subsequently, the bronchoscope was taken to the lingula, BAL was performed. Washings were sent for cytology and microbiology. Is adequate samples appear to have been obtained, the bronchoscope was removed. The patient was taken to recovery. Postoperative chest x-ray was ordered.   Condition post procedure: Stable.    Complications: --  Patient instructions: Provided in note.    Marda Stalker, MD.  Board Certified in Internal Medicine, Pulmonary Medicine, [place], and Sleep Medicine.  [facility] Office Number: [phone]  Patricia Pesa, M.D.  Vilinda Boehringer, M.D.  Cheral Marker, M.D  10/14/2015

## 2015-10-14 NOTE — Transfer of Care (Signed)
Immediate Anesthesia Transfer of Care Note  Patient: Rodney Stewart  Procedure(s) Performed: Procedure(s): ENDOBRONCHIAL ULTRASOUND (N/A)  Patient Location: PACU  Anesthesia Type:General  Level of Consciousness: awake, alert  and oriented  Airway & Oxygen Therapy: Patient Spontanous Breathing and Patient connected to nasal cannula oxygen  Post-op Assessment: Report given to RN, Post -op Vital signs reviewed and stable and Patient moving all extremities X 4  Post vital signs: Reviewed and stable  Last Vitals:  Vitals:   10/14/15 1209 10/14/15 1421  BP: (!) 159/80 140/73  Pulse: 62 78  Resp: 16 13  Temp: (!) 35.7 C 36.4 C    Last Pain:  Vitals:   10/14/15 1209  TempSrc: Tympanic  PainSc: 2          Complications: No apparent anesthesia complications

## 2015-10-14 NOTE — H&P (View-Only) (Signed)
[facility] Consultation      Assessment and Plan:  A: --Renal cell cancer s/p left nephrectomy in 2016 --Mediastinal lymphadenopathy, suspicious for malignancy.  --Multiple lung nodules suspicious for metastatic disease in setting of previous renal cell cancer.  --AKI on CKD.  --Alcohol abuse with fatty liver and cirrhosis.   P: Will plan for bronchoscopy. As patient is stable this could be done either inpatient or outpatient if the patient is otherwise stable for discharge.    Date: 10/02/2015  MRN# [id number] Rodney Stewart 05-16-1955  Referring Physician: Dr. Raelyn Rodney Stewart is a 60 y.o. old male seen in consultation for chief complaint of:    Chief Complaint  Patient presents with  . Chest Pain    HPI:   The patient is a 60 yo male with a history of renal cell cancer s/p left nephrectomy in May of 2016 with CKD since that time. He presents now with acute bronchitis symptoms of mild dyspnea and cough for the past 3 weeks, he then developed left scapular pain about 1 week ago. These symptoms have been progressive, and he presented to the hospital.  CXR, Ct scan images from this admission were reviewed: CXR showed left hilar fullness, therefore CT chest was performed. This showed multiple small nodule throughout both lungs. There was also significant bi-hilar lymphadenopathy as well as large subcarinal lymphadenopathy extending to the right infra-hilar region.   The patient is a former smoker, last smoked about 20 years ago. He worked in [employer], retired in 2008. No known occupational exposures.     PMHX:   Past Medical History:  Diagnosis Date  . BPH (benign prostatic hyperplasia)   . CKD (chronic kidney disease) stage 3, GFR 30-59 ml/min   . Clear cell renal cell carcinoma (HCC)   . Diabetes mellitus without complication ([place])   . History of nephrectomy   . Hypertension    Surgical Hx:  Past Surgical History:  Procedure  Laterality Date  . KNEE SURGERY    . NEPHRECTOMY     Family Hx:  Family History  Problem Relation Age of Onset  . Cancer Mother   . Diabetes Father   . Hypertension Father    Social Hx:   Social History  Substance Use Topics  . Smoking status: Never Smoker  . Smokeless tobacco: Never Used  . Alcohol use 25.2 oz/week    42 Cans of beer per week     Comment: 5-6 cans a day   Medication:       Allergies:  Review of patient's allergies indicates no known allergies.  Review of Systems: Gen:  Denies  fever, sweats, chills HEENT: Denies blurred vision, double vision. Bleeds. Cvc:  No dizziness, chest pain. Resp:   Denies shortness of breath Gi: Denies swallowing difficulty, stomach pain. Gu:  Denies bladder incontinence, burning urine Ext:   No Joint pain, stiffness. Skin: No skin rash,  hives  Endoc:  No polyuria, polydipsia. Psych: No depression, insomnia. Other:  All other systems were reviewed with the patient and were negative other that what is mentioned in the HPI.   Physical Examination:   VS: BP 137/77 (BP Location: Right Arm)   Pulse 66   Temp 98 F (36.7 C) (Oral)   Resp 17   Ht '5\' 11"'$  (1.803 m)   Wt 186 lb 11.2 oz (84.7 kg)   SpO2 98%   BMI 26.04 kg/m   General Appearance: No distress  Neuro:without  focal findings,  speech normal,  HEENT: PERRLA, EOM intact.   Pulmonary: normal breath sounds, No wheezing.  CardiovascularNormal S1,S2.  No m/r/g.   Abdomen: Benign, Soft, non-tender. Renal:  No costovertebral tenderness  GU:  No performed at this time. Endoc: No evident thyromegaly, no signs of acromegaly. Skin:   warm, no rashes, no ecchymosis  Extremities: normal, no cyanosis, clubbing.  Other findings:    LABORATORY PANEL:   CBC  Recent Labs Lab 10/01/15 0442  WBC 4.1  HGB 9.2*  HCT 26.8*  PLT 97*   ------------------------------------------------------------------------------------------------------------------  Chemistries    Recent Labs Lab 09/30/15 1145  10/02/15 0738  NA 128*  < > 135  K 5.3*  < > 5.1  CL 95*  < > 105  CO2 27  < > 25  GLUCOSE 376*  < > 84  BUN 25*  < > 27*  CREATININE 3.53*  < > 3.16*  CALCIUM 9.6  < > 8.3*  AST 46*  --   --   ALT 28  --   --   ALKPHOS 231*  --   --   BILITOT 1.5*  --   --   < > = values in this interval not displayed. ------------------------------------------------------------------------------------------------------------------  Cardiac Enzymes  Recent Labs Lab 09/30/15 1631  TROPONINI <0.03   ------------------------------------------------------------  RADIOLOGY:  Ct Abdomen Pelvis Wo Contrast  Result Date: 10/02/2015 CLINICAL DATA:  60 year old male with history of renal cell carcinoma status post left nephrectomy in May 2014. Multiple pulmonary nodules and mediastinal lymphadenopathy noted on recent chest CT. Possible recurrent metastatic disease. EXAM: CT ABDOMEN AND PELVIS WITHOUT CONTRAST TECHNIQUE: Multidetector CT imaging of the abdomen and pelvis was performed following the standard protocol without IV contrast. COMPARISON:  CT the abdomen and pelvis 05/09/2010. FINDINGS: Lower chest: Multiple pulmonary nodules are again noted throughout the visualize lung bases bilaterally, the largest of which measures 20 x 19 mm in the left lower lobe (image 4 of series 3). Hepatobiliary: Mild diffuse decreased attenuation throughout the hepatic parenchyma, compatible with hepatic steatosis. No discrete cystic or solid hepatic lesions are confidently identified on today's noncontrast CT examination. Liver early nodular contour, suggestive of underlying cirrhosis. Several calcified gallstones are noted in the neck of the gallbladder. Gallbladder does not appear distended. Pancreas: No definite pancreatic mass or peripancreatic inflammatory changes. Spleen: The spleen is enlarged measuring 16.8 x 8.2 x 18.9 cm (estimated splenic volume of 1,302 mL). Adrenals/Urinary  Tract: Status post left radical nephrectomy. No definite mass in the nephrectomy bed to suggest local recurrence of disease. Trace volume of free fluid in the nephrectomy bed, adjacent and multiple loops of small bowel. Unenhanced appearance of the right kidney is normal. Right adrenal gland is normal in appearance. Left adrenal gland is not confidently identified and may have been surgically resected. No right-sided hydroureteronephrosis. Urinary bladder is normal in appearance. Stomach/Bowel: The appearance of the stomach is normal. There is no pathologic dilatation of small bowel or colon. Normal appendix. Vascular/Lymphatic: Aortic atherosclerosis. Dilatation of the portal vein (2.1 cm in diameter). No definite aneurysm identified in the abdominal or pelvic vasculature. No lymphadenopathy noted in the abdomen or pelvis. Reproductive: Prostate gland and seminal vesicles are unremarkable in appearance. Other: Small volume of ascites, most evident adjacent to the liver and spleen. No pneumoperitoneum. Musculoskeletal: There are no aggressive appearing lytic or blastic lesions noted in the visualized portions of the skeleton. IMPRESSION: 1. No definite evidence to suggest local recurrence of disease in the nephrectomy  bed. No definite signs of metastatic disease in the abdomen or pelvis on today's noncontrast CT examination. 2. Multiple pulmonary nodules are again noted throughout the lung bases bilaterally, similar to the recent chest CT, concerning for metastatic disease. However, the possibility of a primary left lower lobe neoplasm with metastatic disease to the lungs and mediastinum is not excluded, and correlation with biopsy to establish a tissue diagnosis is suggested if clinically appropriate. 3. Hepatic steatosis with morphologic changes in the liver suggestive of underlying cirrhosis. This is associated with dilatation of the portal vein (2.1 cm in diameter) and splenomegaly, suggestive of underlying  portal hypertension. In addition, there is a small volume of ascites. 4. Cholelithiasis, without definitive evidence to suggest acute cholecystitis at this time. 5. Aortic atherosclerosis. Electronically Signed   By: Vinnie Langton M.D.   On: 10/02/2015 13:30   Dg Scapula Left  Result Date: 10/01/2015 CLINICAL DATA:  Left posterior back pain near scapula for 1 week after heavy lifting. EXAM: LEFT SCAPULA - 2+ VIEWS COMPARISON:  CT scan from yesterday FINDINGS: There is no evidence of fracture or other focal bone lesions. Soft tissues are unremarkable. IMPRESSION: Negative. Electronically Signed   By: Dorise Bullion III M.D   On: 10/01/2015 13:56       Thank  you for the consultation and for allowing [facility] to assist in the care of your patient. Our recommendations are noted above.  Please contact us if we can be of further service.   Marda Stalker, MD.  Board Certified in Internal Medicine, Pulmonary Medicine, [place], and Sleep Medicine.  [facility] Office Rodney: [phone]  Patricia Pesa, M.D.  Vilinda Boehringer, M.D.  Merton Border, M.D  10/02/2015

## 2015-10-17 ENCOUNTER — Telehealth: Payer: Self-pay | Admitting: *Deleted

## 2015-10-17 LAB — CULTURE, BAL-QUANTITATIVE W GRAM STAIN: Culture: NO GROWTH

## 2015-10-17 LAB — CULTURE, BAL-QUANTITATIVE: CULTURE: NO GROWTH

## 2015-10-17 NOTE — Telephone Encounter (Signed)
-----   Message from [name], MD sent at 10/17/2015  3:08 PM EDT ----- Regarding: RE: pt needs follow up after biopsy.  11/6 is OK. He is following up with cancer center for results.  ----- Message ----- From: Renelda Mom, LPN Sent: 44/92/0100   8:11 AM To: [name], MD Subject: FW: pt needs follow up after biopsy.           DR,  Your next available is November 6th. You are not in clinic next week due to vacation. Please advise for f/u appt. ----- Message ----- From: Lloyd Huger, MD Sent: 10/14/2015   3:15 PM To: Renelda Mom, LPN, Shawn Trenton Gammon, RN, # Subject: RE: pt needs follow up after biopsy.           He's getting a PET on 10/19 and then sees me on 10/26.  Thanks Deep!  ----- Message ----- From: [name], MD Sent: 10/14/2015   2:43 PM To: Lloyd Huger, MD, Renelda Mom, LPN, # Subject: pt needs follow up after biopsy.               Roylee Chaffin, please have the patient follow up with me in clinic for next available, I'm in the unit next week so it would have to be following week.  Shawn, EBUS bronch completed today, can you have the patient follow up at cancer center next week for results?  Thanks.

## 2015-10-17 NOTE — Telephone Encounter (Signed)
F/u appt scheduled. Nothing further needed.

## 2015-10-17 NOTE — Anesthesia Postprocedure Evaluation (Signed)
Anesthesia Post Note  Patient: Rodney Stewart  Procedure(s) Performed: Procedure(s) (LRB): ENDOBRONCHIAL ULTRASOUND (N/A)  Patient location during evaluation: PACU Anesthesia Type: General Level of consciousness: awake and alert Pain management: pain level controlled Vital Signs Assessment: post-procedure vital signs reviewed and stable Respiratory status: spontaneous breathing, nonlabored ventilation, respiratory function stable and patient connected to nasal cannula oxygen Cardiovascular status: blood pressure returned to baseline and stable Postop Assessment: no signs of nausea or vomiting Anesthetic complications: no    Last Vitals:  Vitals:   10/14/15 1511 10/14/15 1529  BP: 139/70 (!) 147/68  Pulse: 65 65  Resp: 16 16  Temp: 36.3 C     Last Pain:  Vitals:   10/14/15 1529  TempSrc:   PainSc: 0-No pain                 Precious Haws Lenda Baratta

## 2015-10-18 LAB — CYTOLOGY - NON PAP

## 2015-10-20 ENCOUNTER — Ambulatory Visit
Admission: RE | Admit: 2015-10-20 | Discharge: 2015-10-20 | Disposition: A | Discharge status: Home / Self Care | Payer: Medicare HMO | Source: Ambulatory Visit | Attending: Oncology | Admitting: Oncology

## 2015-10-20 DIAGNOSIS — C7802 Secondary malignant neoplasm of left lung: Secondary | ICD-10-CM | POA: Insufficient documentation

## 2015-10-20 DIAGNOSIS — Z905 Acquired absence of kidney: Secondary | ICD-10-CM | POA: Insufficient documentation

## 2015-10-20 DIAGNOSIS — R161 Splenomegaly, not elsewhere classified: Secondary | ICD-10-CM | POA: Diagnosis not present

## 2015-10-20 DIAGNOSIS — C649 Malignant neoplasm of unspecified kidney, except renal pelvis: Secondary | ICD-10-CM | POA: Diagnosis not present

## 2015-10-20 DIAGNOSIS — R918 Other nonspecific abnormal finding of lung field: Secondary | ICD-10-CM | POA: Diagnosis not present

## 2015-10-20 LAB — GLUCOSE, CAPILLARY: Glucose-Capillary: 124 mg/dL — ABNORMAL HIGH (ref 65–99)

## 2015-10-20 MED ORDER — FLUDEOXYGLUCOSE F - 18 (FDG) INJECTION
12.0000 | Freq: Once | INTRAVENOUS | Status: AC | PRN
  Administered 2015-10-20: 13.17 via INTRAVENOUS

## 2015-10-24 LAB — VIRUS CULTURE

## 2015-10-25 ENCOUNTER — Other Ambulatory Visit: Payer: Self-pay | Admitting: Oncology

## 2015-10-25 DIAGNOSIS — C649 Malignant neoplasm of unspecified kidney, except renal pelvis: Secondary | ICD-10-CM

## 2015-10-25 DIAGNOSIS — C7802 Secondary malignant neoplasm of left lung: Principal | ICD-10-CM

## 2015-10-26 NOTE — Progress Notes (Signed)
[place]  [phone] [phone]  ID: Diamantina Providence OB: Feb 10, 1955  MR#: [id number]  JIR#:[id number]  Patient Care Team: No Pcp Per Patient as PCP - General (General Practice)  CHIEF COMPLAINT: Stage IV renal cell carcinoma with bilateral lung metastases.  INTERVAL HISTORY: Patient returns to clinic today for further evaluation and discussion of his imaging and pathology results.  Currently, patient feels well and is back to his baseline. He has no neurologic complaints. He denies any fevers. He has a fair appetite, but denies weight loss. He denies any chest pain, cough, shortness of breath, or hemoptysis. He has no nausea, vomiting, constipation, or diarrhea. He has no melena or hematochezia. He has no urinary complaints. Patient offers no further specific complaints today.   REVIEW OF SYSTEMS:   Review of Systems  Constitutional: Negative.  Negative for fever and malaise/fatigue.  Respiratory: Negative.  Negative for cough and shortness of breath.   Cardiovascular: Negative.  Negative for chest pain and leg swelling.  Gastrointestinal: Negative.  Negative for abdominal pain, constipation, diarrhea, nausea and vomiting.  Genitourinary: Negative.   Musculoskeletal: Negative.   Neurological: Negative.  Negative for weakness.  Psychiatric/Behavioral: Negative.  The patient is not nervous/anxious.    As per HPI. Otherwise, a complete review of systems is negative.   PAST MEDICAL HISTORY: Past Medical History:  Diagnosis Date  . Anemia   . BPH (benign prostatic hyperplasia)   . CKD (chronic kidney disease) stage 3, GFR 30-59 ml/min   . Clear cell renal cell carcinoma (HCC)   . Diabetes mellitus without complication ([place])   . Dyspnea    with exertion  . GERD (gastroesophageal reflux disease)   . History of gout   . History of nephrectomy    Left  . Hypertension   . Neuropathy ([place] II)   . Umbilical hernia     PAST SURGICAL HISTORY: Past  Surgical History:  Procedure Laterality Date  . BACK SURGERY    . ENDOBRONCHIAL ULTRASOUND N/A 10/14/2015   Procedure: ENDOBRONCHIAL ULTRASOUND;  Surgeon: [name], MD;  Location: [facility] ORS;  Service: Pulmonary;  Laterality: N/A;  . EYE SURGERY Bilateral    Cataract Extraction with IOL  . KNEE SURGERY Right   . NEPHRECTOMY      FAMILY HISTORY: Family History  Problem Relation Age of Onset  . Cancer Mother   . Diabetes Father   . Hypertension Father     ADVANCED DIRECTIVES (Y/N):  N  HEALTH MAINTENANCE: Social History  Substance Use Topics  . Smoking status: Former Smoker    Packs/day: 1.00    Types: Cigarettes  . Smokeless tobacco: Never Used  . Alcohol use 25.2 oz/week    42 Cans of beer per week     Comment: 5-6 cans a day     Colonoscopy:  PAP:  Bone density:  Lipid panel:  No Known Allergies  Current Outpatient Prescriptions  Medication Sig Dispense Refill  . colchicine 0.6 MG tablet Take 0.6 mg by mouth every other day.     . gabapentin (NEURONTIN) 100 MG capsule Take 100 mg by mouth 3 (three) times daily.     Marland Kitchen LEVEMIR FLEXTOUCH 100 UNIT/ML Pen Inject 48 Units into the skin daily at 10 pm.     . lovastatin (MEVACOR) 20 MG tablet Take 20 mg by mouth daily.     . Nebivolol HCl 20 MG TABS Take 20 mg by mouth daily.    Marland Kitchen NOVOLOG 100 UNIT/ML injection Inject  12 Units into the skin daily at 12 noon. At noon.    . tamsulosin (FLOMAX) 0.4 MG CAPS capsule Take 0.4 mg by mouth daily.     . pazopanib (VOTRIENT) 200 MG tablet Take 4 tablets (800 mg total) by mouth daily. Take on an empty stomach. (Patient not taking: Reported on 10/27/2015) 120 tablet 5   No current facility-administered medications for this visit.     OBJECTIVE: Vitals:   10/27/15 1047  BP: (!) 171/93  Pulse: (!) 56  Resp: 18  Temp: 97.5 F (36.4 C)     Body mass index is 25.24 kg/m.    ECOG FS:0 - Asymptomatic  General: Well-developed, well-nourished, no acute distress. Eyes:  Pink conjunctiva, anicteric sclera. Lungs: Clear to auscultation bilaterally. Heart: Regular rate and rhythm. No rubs, murmurs, or gallops. Abdomen: Soft, nontender, nondistended. No organomegaly noted, normoactive bowel sounds. Musculoskeletal: No edema, cyanosis, or clubbing. Neuro: Alert, answering all questions appropriately. Cranial nerves grossly intact. Skin: No rashes or petechiae noted. Psych: Normal affect.    LAB RESULTS:  Lab Results  Component Value Date   NA 135 10/03/2015   K 4.4 10/03/2015   CL 103 10/03/2015   CO2 26 10/03/2015   GLUCOSE 72 10/03/2015   BUN 22 (H) 10/03/2015   CREATININE 2.28 (H) 10/03/2015   CALCIUM 8.2 (L) 10/03/2015   PROT 7.4 09/30/2015   ALBUMIN 3.3 (L) 09/30/2015   AST 46 (H) 09/30/2015   ALT 28 09/30/2015   ALKPHOS 231 (H) 09/30/2015   BILITOT 1.5 (H) 09/30/2015   GFRNONAA 30 (L) 10/03/2015   GFRAA 34 (L) 10/03/2015    Lab Results  Component Value Date   WBC 2.7 (L) 10/07/2015   NEUTROABS 2.2 09/30/2015   HGB 10.0 (L) 10/07/2015   HCT 30.4 (L) 10/07/2015   MCV 90.1 10/07/2015   PLT 142 (L) 10/07/2015     STUDIES: Ct Abdomen Pelvis Wo Contrast  Result Date: 10/02/2015 CLINICAL DATA:  60 year old male with history of renal cell carcinoma status post left nephrectomy in May 2014. Multiple pulmonary nodules and mediastinal lymphadenopathy noted on recent chest CT. Possible recurrent metastatic disease. EXAM: CT ABDOMEN AND PELVIS WITHOUT CONTRAST TECHNIQUE: Multidetector CT imaging of the abdomen and pelvis was performed following the standard protocol without IV contrast. COMPARISON:  CT the abdomen and pelvis 05/09/2010. FINDINGS: Lower chest: Multiple pulmonary nodules are again noted throughout the visualize lung bases bilaterally, the largest of which measures 20 x 19 mm in the left lower lobe (image 4 of series 3). Hepatobiliary: Mild diffuse decreased attenuation throughout the hepatic parenchyma, compatible with hepatic  steatosis. No discrete cystic or solid hepatic lesions are confidently identified on today's noncontrast CT examination. Liver early nodular contour, suggestive of underlying cirrhosis. Several calcified gallstones are noted in the neck of the gallbladder. Gallbladder does not appear distended. Pancreas: No definite pancreatic mass or peripancreatic inflammatory changes. Spleen: The spleen is enlarged measuring 16.8 x 8.2 x 18.9 cm (estimated splenic volume of 1,302 mL). Adrenals/Urinary Tract: Status post left radical nephrectomy. No definite mass in the nephrectomy bed to suggest local recurrence of disease. Trace volume of free fluid in the nephrectomy bed, adjacent and multiple loops of small bowel. Unenhanced appearance of the right kidney is normal. Right adrenal gland is normal in appearance. Left adrenal gland is not confidently identified and may have been surgically resected. No right-sided hydroureteronephrosis. Urinary bladder is normal in appearance. Stomach/Bowel: The appearance of the stomach is normal. There is no pathologic dilatation  of small bowel or colon. Normal appendix. Vascular/Lymphatic: Aortic atherosclerosis. Dilatation of the portal vein (2.1 cm in diameter). No definite aneurysm identified in the abdominal or pelvic vasculature. No lymphadenopathy noted in the abdomen or pelvis. Reproductive: Prostate gland and seminal vesicles are unremarkable in appearance. Other: Small volume of ascites, most evident adjacent to the liver and spleen. No pneumoperitoneum. Musculoskeletal: There are no aggressive appearing lytic or blastic lesions noted in the visualized portions of the skeleton. IMPRESSION: 1. No definite evidence to suggest local recurrence of disease in the nephrectomy bed. No definite signs of metastatic disease in the abdomen or pelvis on today's noncontrast CT examination. 2. Multiple pulmonary nodules are again noted throughout the lung bases bilaterally, similar to the recent  chest CT, concerning for metastatic disease. However, the possibility of a primary left lower lobe neoplasm with metastatic disease to the lungs and mediastinum is not excluded, and correlation with biopsy to establish a tissue diagnosis is suggested if clinically appropriate. 3. Hepatic steatosis with morphologic changes in the liver suggestive of underlying cirrhosis. This is associated with dilatation of the portal vein (2.1 cm in diameter) and splenomegaly, suggestive of underlying portal hypertension. In addition, there is a small volume of ascites. 4. Cholelithiasis, without definitive evidence to suggest acute cholecystitis at this time. 5. Aortic atherosclerosis. Electronically Signed   By: Vinnie Langton M.D.   On: 10/02/2015 13:30   Dg Chest 1 View  Result Date: 10/14/2015 CLINICAL DATA:  Status post lung biopsy. EXAM: CHEST 1 VIEW COMPARISON:  PA and lateral chest and CT chest 09/30/2015. FINDINGS: There is no pneumothorax. Lymphadenopathy is again seen. Nodular opacity right upper lobe is noted. No pleural effusion. Heart size is normal. IMPRESSION: Negative for pneumothorax after lung biopsy. Electronically Signed   By: Inge Rise M.D.   On: 10/14/2015 15:08   Dg Scapula Left  Result Date: 10/01/2015 CLINICAL DATA:  Left posterior back pain near scapula for 1 week after heavy lifting. EXAM: LEFT SCAPULA - 2+ VIEWS COMPARISON:  CT scan from yesterday FINDINGS: There is no evidence of fracture or other focal bone lesions. Soft tissues are unremarkable. IMPRESSION: Negative. Electronically Signed   By: Dorise Bullion III M.D   On: 10/01/2015 13:56   Nm Pet Image Initial (pi) Skull Base To Thigh  Result Date: 10/20/2015 CLINICAL DATA:  Subsequent treatment strategy for renal cell carcinoma. LEFT nephrectomy 2014. LEFT lung lesion. EXAM: NUCLEAR MEDICINE PET SKULL BASE TO THIGH TECHNIQUE: 13.2 mCi F-18 FDG was injected intravenously. Full-ring PET imaging was performed from the skull  base to thigh after the radiotracer. CT data was obtained and used for attenuation correction and anatomic localization. FASTING BLOOD GLUCOSE:  Value: 124 mg/dl COMPARISON:  CT 10/02/2015 FINDINGS: NECK No hypermetabolic lymph nodes in the neck. CHEST Lobular LEFT lower lobe nodule measuring 22 mm (image 110, series 4) has mild metabolic activity SUV max of 2.5. Similar mild metabolic activity with a large LEFT infrahilar nodule measuring 3.3 cm with SUV max equal 3.3. The large subcarinal lymph node with low central attenuation does not have associated metabolic activity is likely necrotic. This node measures 4.1 cm. Smaller lesions in the lung including 12 mm RIGHT upper lobe nodule (image 78, series 4) also with low metabolic activity (SUV max equal 1.4. ABDOMEN/PELVIS No abnormal metabolic activity within the liver. The spleen is enlarged. Post LEFT nephrectomy. No hypermetabolic adenopathy in the abdomen pelvis. No peritoneal implants are identified. SKELETON No focal hypermetabolic activity to suggest skeletal  metastasis. IMPRESSION: 1. Mild metabolic activity associated with pulmonary and hilar nodules consistent with metastatic renal cell carcinoma. 2. No metabolic activity within the presumed necrotic subcarinal lymph node. 3. No evidence of malignancy in the abdomen or pelvis. LEFT nephrectomy. 4. Splenomegaly. Electronically Signed   By: Suzy Bouchard M.D.   On: 10/20/2015 15:47    ASSESSMENT: Stage IV left renal cell carcinoma with metastasis to the lungs  PLAN:    1. Stage IV left renal cell carcinoma with metastasis to the lungs:  PET scan results reviewed independently and reported as above highly suspicious for metastatic disease in the lung only. Biopsy confirmed recurrence of renal cell carcinoma. Patient underwent left nephrectomy on May 16, 2012 which revealed a clear cell grade 2 renal cell carcinoma, stage TIIIa, N0, M0. Tumor size of 16 cm. Patient was noted to have renal vein  involvement, but no other structures were involved. 0 of 2 lymph nodes were negative for disease. Proceed with Votrient 800 mg daily which does not require dose reduction for renal insufficiency. Return to clinic in 6 weeks for repeat laboratory work and further evaluation. 2. Acute renal failure: Patient's creatinine remains elevated, but improved since admission. Monitor 3. Anemia: Likely multifactorial, monitor. 4. Thrombocytopenia: Unclear patient's baseline, will continue to monitor. 5. Leukopenia: Unclear etiology, monitor.  Patient expressed understanding and was in agreement with this plan. He also understands that He can call clinic at any time with any questions, concerns, or complaints.   Metastatic renal cell carcinoma to lung [facility])   Staging form: Kidney, AJCC 7th Edition   - Clinical stage from 10/12/2015: Stage IV (TX, N0, M1) - Signed by Lloyd Huger, MD on 10/12/2015  Lloyd Huger, MD   10/31/2015 10:44 AM

## 2015-10-27 ENCOUNTER — Inpatient Hospital Stay (HOSPITAL_BASED_OUTPATIENT_CLINIC_OR_DEPARTMENT_OTHER): Payer: Medicare HMO | Admitting: Oncology

## 2015-10-27 ENCOUNTER — Inpatient Hospital Stay: Payer: Medicare HMO

## 2015-10-27 VITALS — BP 171/93 | HR 56 | Temp 97.5°F | Resp 18 | Wt 181.0 lb

## 2015-10-27 DIAGNOSIS — C642 Malignant neoplasm of left kidney, except renal pelvis: Secondary | ICD-10-CM

## 2015-10-27 DIAGNOSIS — C7802 Secondary malignant neoplasm of left lung: Secondary | ICD-10-CM | POA: Diagnosis not present

## 2015-10-27 DIAGNOSIS — D631 Anemia in chronic kidney disease: Secondary | ICD-10-CM | POA: Diagnosis not present

## 2015-10-27 DIAGNOSIS — D72819 Decreased white blood cell count, unspecified: Secondary | ICD-10-CM

## 2015-10-27 DIAGNOSIS — E1122 Type 2 diabetes mellitus with diabetic chronic kidney disease: Secondary | ICD-10-CM | POA: Diagnosis not present

## 2015-10-27 DIAGNOSIS — C649 Malignant neoplasm of unspecified kidney, except renal pelvis: Secondary | ICD-10-CM

## 2015-10-27 DIAGNOSIS — N179 Acute kidney failure, unspecified: Secondary | ICD-10-CM | POA: Diagnosis not present

## 2015-10-27 DIAGNOSIS — D696 Thrombocytopenia, unspecified: Secondary | ICD-10-CM

## 2015-10-27 DIAGNOSIS — C7801 Secondary malignant neoplasm of right lung: Secondary | ICD-10-CM

## 2015-10-27 DIAGNOSIS — Z79899 Other long term (current) drug therapy: Secondary | ICD-10-CM

## 2015-10-27 DIAGNOSIS — C7800 Secondary malignant neoplasm of unspecified lung: Principal | ICD-10-CM

## 2015-10-27 DIAGNOSIS — I129 Hypertensive chronic kidney disease with stage 1 through stage 4 chronic kidney disease, or unspecified chronic kidney disease: Secondary | ICD-10-CM | POA: Diagnosis not present

## 2015-10-27 NOTE — Progress Notes (Signed)
States is feeling well. Offers no complaints. Once receives votrient in mail, pt is to start on votrient '200mg'$  tablets, to take 4 tablets ('800mg'$ ) orally once a day. Pt was educated on how to take votrient, when to call our office, and what to do if misses dose. Side effects such as low blood counts, nausea, vomiting, diarrhea were reviewed with the patient. Patient was given educational information regarding votreint. Pt verbalized understanding of all information and stated will review once he gets home as well. Pt instructed to call our office if has further questions regarding votrient. Informed consent was obtained.

## 2015-10-31 ENCOUNTER — Other Ambulatory Visit: Payer: Self-pay | Admitting: *Deleted

## 2015-10-31 DIAGNOSIS — C7800 Secondary malignant neoplasm of unspecified lung: Principal | ICD-10-CM

## 2015-10-31 DIAGNOSIS — C649 Malignant neoplasm of unspecified kidney, except renal pelvis: Secondary | ICD-10-CM

## 2015-10-31 MED ORDER — PAZOPANIB HCL 200 MG PO TABS: 800.0000 mg | ORAL_TABLET | Freq: Every day | ORAL | 5 refills | Status: DC

## 2015-11-02 ENCOUNTER — Telehealth: Payer: Self-pay | Admitting: *Deleted

## 2015-11-02 NOTE — Telephone Encounter (Signed)
Call placed to patient regarding Votrient. Patient has been approved for free drug through [employer] patient assistance foundation. Patient also notified that we have obtained samples for 30 day free trial and he can pick those up at his convenience. Patient states he will come to cancer center tomorrow to pick up samples.

## 2015-11-03 ENCOUNTER — Encounter: Payer: Self-pay | Admitting: *Deleted

## 2015-11-03 NOTE — Patient Instructions (Signed)
Patient came by clinic today to pick up samples of Votrient 200 mg tablets. Patient given 1 bottle of Votrient 200 mg tablets, quantity 120. Sample bottle labeled by [place]. Patient instructed to take 4 tablets (800 mg) once daily, patient verbalized understanding.

## 2015-11-04 LAB — CULTURE, FUNGUS WITHOUT SMEAR

## 2015-11-06 NOTE — Progress Notes (Signed)
* [facility]     Assessment and Plan:  Renal Cell cancer with metastasis to lungs.  --Confirmed with positive EBUS bronchoscopy on 10/14/15. --doing very well without any respiratory issues, with breathing at baseline.  --Has already received a flu vaccine this season.   Date: 11/06/2015  MRN# [id number] Rodney Stewart Jul 23, 1955   Rodney Stewart is a 60 y.o. old male seen in follow up for chief complaint of  Chief Complaint  Patient presents with  . Follow-up    pt had bx 10-14-15. breathing is doing well, no new concerns today.     HPI:  The patient is a 60 yo male with a history of renal cancer s/p left nephrectomy in 2014. He underwent bronchoscopy with EBUS on 10/14/15 for mediastinal lymphadenopathy which was positive of metastatic renal cell cancer. He has since followed up with Dr. Grayland Ormond and was started on a regimen of Votrient 800 mg daily.  His breathing has been feeling well, he has no complaint with dyspnea. He is not a smoker and quite more than 20 years ago.   He is no inhalers, he does feel that he is limited by breathing.    Medication:   Outpatient Encounter Prescriptions as of 11/07/2015  Medication Sig  . colchicine 0.6 MG tablet Take 0.6 mg by mouth every other day.   . gabapentin (NEURONTIN) 100 MG capsule Take 100 mg by mouth 3 (three) times daily.   Marland Kitchen LEVEMIR FLEXTOUCH 100 UNIT/ML Pen Inject 48 Units into the skin daily at 10 pm.   . lovastatin (MEVACOR) 20 MG tablet Take 20 mg by mouth daily.   . Nebivolol HCl 20 MG TABS Take 20 mg by mouth daily.  Marland Kitchen NOVOLOG 100 UNIT/ML injection Inject 12 Units into the skin daily at 12 noon. At noon.  . pazopanib (VOTRIENT) 200 MG tablet Take 4 tablets (800 mg total) by mouth daily. Take on an empty stomach.  . tamsulosin (FLOMAX) 0.4 MG CAPS capsule Take 0.4 mg by mouth daily.    No facility-administered encounter medications on file as of 11/07/2015.      Allergies:  Patient has no known  allergies.  Review of Systems: Gen:  Denies  fever, sweats. HEENT: Denies blurred vision. Cvc:  No dizziness, chest pain or heaviness Resp:   Denies cough or sputum porduction. Gi: Denies swallowing difficulty, stomach pain. constipation, bowel incontinence Gu:  Denies bladder incontinence, burning urine Ext:   No Joint pain, stiffness. Skin: No skin rash, easy bruising. Endoc:  No polyuria, polydipsia. Psych: No depression, insomnia. Other:  All other systems were reviewed and found to be negative other than what is mentioned in the HPI.   Physical Examination:   VS: BP 132/86 (BP Location: Left Arm, Cuff Size: Normal)   Pulse 67   Ht '5\' 11"'$  (1.803 m)   Wt 179 lb 6.4 oz (81.4 kg)   SpO2 100%   BMI 25.02 kg/m   General Appearance: No distress  Neuro:without focal findings,  speech normal,  HEENT: PERRLA, EOM intact. Pulmonary: normal breath sounds, No wheezing.   CardiovascularNormal S1,S2.  No m/r/g.   Abdomen: Benign, Soft, non-tender. Renal:  No costovertebral tenderness  GU:  Not performed at this time. Endoc: No evident thyromegaly, no signs of acromegaly. Skin:   warm, no rash. Extremities: normal, no cyanosis, clubbing.   LABORATORY PANEL:   CBC No results for input(s): WBC, HGB, HCT, PLT in the last 168 hours. ------------------------------------------------------------------------------------------------------------------  Chemistries  No results for input(s): NA, K, CL, CO2, GLUCOSE, BUN, CREATININE, CALCIUM, MG, AST, ALT, ALKPHOS, BILITOT in the last 168 hours.  Invalid input(s): GFRCGP ------------------------------------------------------------------------------------------------------------------  Cardiac Enzymes No results for input(s): TROPONINI in the last 168 hours. ------------------------------------------------------------  RADIOLOGY:   No results found for this or any previous visit. Results for orders placed during the hospital encounter  of 09/30/15  DG Chest 2 View   Narrative CLINICAL DATA:  Chest pain radiating. left shoulder pain.  EXAM: CHEST  2 VIEW  FINDINGS: Mediastinum is unremarkable. Left hilar mass is noted. Although this could be vascular, adenopathy or malignancy cannot be excluded. Questionable pulmonary nodule in the right upper lobe. IV contrast-enhanced chest CT suggest for further evaluation these findings. Heart size normal. No pleural effusion or pneumothorax. No acute bony abnormality.  IMPRESSION: 1. Left hilar fullness/mass.  2. Questionable right upper lobe pulmonary nodule.  IV contrast-enhanced chest CT is suggested for further evaluation of these findings.   Electronically Signed   By: Marcello Moores  Register   On: 09/30/2015 11:26    ------------------------------------------------------------------------------------------------------------------  Thank  you for allowing [facility] to assist in the care of your patient. Our recommendations are noted above.  Please contact us if we can be of further service.   Marda Stalker, MD.  [facility] Office Number: [phone]  Patricia Pesa, M.D.  Vilinda Boehringer, M.D.  Merton Border, M.D  11/06/2015

## 2015-11-07 ENCOUNTER — Encounter: Payer: Self-pay | Admitting: Internal Medicine

## 2015-11-07 ENCOUNTER — Ambulatory Visit (INDEPENDENT_AMBULATORY_CARE_PROVIDER_SITE_OTHER): Payer: Medicare HMO | Admitting: Internal Medicine

## 2015-11-07 VITALS — BP 132/86 | HR 67 | Ht 71.0 in | Wt 179.4 lb

## 2015-11-07 DIAGNOSIS — C7800 Secondary malignant neoplasm of unspecified lung: Secondary | ICD-10-CM

## 2015-11-07 DIAGNOSIS — R918 Other nonspecific abnormal finding of lung field: Secondary | ICD-10-CM | POA: Diagnosis not present

## 2015-11-07 NOTE — Patient Instructions (Signed)
--  Please call us with any respiratory issues and we can see you on an as-needed basis.

## 2015-11-21 DIAGNOSIS — N182 Chronic kidney disease, stage 2 (mild): Secondary | ICD-10-CM | POA: Diagnosis not present

## 2015-11-21 DIAGNOSIS — E871 Hypo-osmolality and hyponatremia: Secondary | ICD-10-CM | POA: Diagnosis not present

## 2015-11-21 DIAGNOSIS — I10 Essential (primary) hypertension: Secondary | ICD-10-CM | POA: Diagnosis not present

## 2015-11-21 DIAGNOSIS — D631 Anemia in chronic kidney disease: Secondary | ICD-10-CM | POA: Diagnosis not present

## 2015-11-21 DIAGNOSIS — N179 Acute kidney failure, unspecified: Secondary | ICD-10-CM | POA: Diagnosis not present

## 2015-11-29 DIAGNOSIS — R69 Illness, unspecified: Secondary | ICD-10-CM | POA: Diagnosis not present

## 2015-11-29 LAB — ACID FAST CULTURE WITH REFLEXED SENSITIVITIES (MYCOBACTERIA)

## 2015-11-29 LAB — ACID FAST CULTURE WITH REFLEXED SENSITIVITIES: ACID FAST CULTURE - AFSCU3: NEGATIVE

## 2015-12-09 DIAGNOSIS — I10 Essential (primary) hypertension: Secondary | ICD-10-CM | POA: Diagnosis not present

## 2015-12-09 DIAGNOSIS — Z6825 Body mass index (BMI) 25.0-25.9, adult: Secondary | ICD-10-CM | POA: Diagnosis not present

## 2015-12-09 DIAGNOSIS — E1142 Type 2 diabetes mellitus with diabetic polyneuropathy: Secondary | ICD-10-CM | POA: Diagnosis not present

## 2015-12-09 DIAGNOSIS — Z0000 Encounter for general adult medical examination without abnormal findings: Secondary | ICD-10-CM | POA: Diagnosis not present

## 2015-12-09 DIAGNOSIS — E785 Hyperlipidemia, unspecified: Secondary | ICD-10-CM | POA: Diagnosis not present

## 2015-12-09 DIAGNOSIS — Z794 Long term (current) use of insulin: Secondary | ICD-10-CM | POA: Diagnosis not present

## 2015-12-11 NOTE — Progress Notes (Signed)
[name]  Telephone:(336) [phone] Fax:(336) [phone]  ID: Rodney Stewart OB: [birth year]  MR#: [id number]  EHU#:[id number]  Patient Care Team: [facility] as PCP - General  CHIEF COMPLAINT: Stage IV renal cell carcinoma with bilateral lung metastases.  INTERVAL HISTORY: Patient returns to clinic today for further evaluation and to assess his toleration of Votrient. He is tolerating treatment well without significant side effects. He has no neurologic complaints. He denies any fevers. He has a fair appetite, but denies weight loss. He denies any chest pain, cough, shortness of breath, or hemoptysis. He has no nausea, vomiting, constipation, or diarrhea. He has no melena or hematochezia. He has no urinary complaints. Patient offers no specific complaints today.   REVIEW OF SYSTEMS:   Review of Systems  Constitutional: Negative.  Negative for fever and malaise/fatigue.  Respiratory: Negative.  Negative for cough and shortness of breath.   Cardiovascular: Negative.  Negative for chest pain and leg swelling.  Gastrointestinal: Negative.  Negative for abdominal pain, constipation, diarrhea, nausea and vomiting.  Genitourinary: Negative.   Musculoskeletal: Negative.   Neurological: Negative.  Negative for weakness.  Psychiatric/Behavioral: Negative.  The patient is not nervous/anxious.    As per HPI. Otherwise, a complete review of systems is negative.   PAST MEDICAL HISTORY: Past Medical History:  Diagnosis Date  . Anemia   . BPH (benign prostatic hyperplasia)   . CKD (chronic kidney disease) stage 3, GFR 30-59 ml/min   . Clear cell renal cell carcinoma (HCC)   . Diabetes mellitus without complication ([place])   . Dyspnea    with exertion  . GERD (gastroesophageal reflux disease)   . History of gout   . History of nephrectomy    Left  . Hypertension   . Neuropathy ([place])   . Umbilical hernia     PAST SURGICAL HISTORY: Past Surgical History:    Procedure Laterality Date  . BACK SURGERY    . ENDOBRONCHIAL ULTRASOUND N/A 10/14/2015   Procedure: ENDOBRONCHIAL ULTRASOUND;  Surgeon: [name], MD;  Location: [facility] ORS;  Service: Pulmonary;  Laterality: N/A;  . EYE SURGERY Bilateral    Cataract Extraction with IOL  . KNEE SURGERY Right   . NEPHRECTOMY      FAMILY HISTORY: Family History  Problem Relation Age of Onset  . Cancer Mother   . Diabetes Father   . Hypertension Father     ADVANCED DIRECTIVES (Y/N):  N  HEALTH MAINTENANCE: Social History  Substance Use Topics  . Smoking status: Former Smoker    Packs/day: 1.00    Types: Cigarettes  . Smokeless tobacco: Never Used  . Alcohol use 25.2 oz/week    42 Cans of beer per week     Comment: 5-6 cans a day     Colonoscopy:  PAP:  Bone density:  Lipid panel:  No Known Allergies  Current Outpatient Prescriptions  Medication Sig Dispense Refill  . colchicine 0.6 MG tablet Take 0.6 mg by mouth every other day.     . gabapentin (NEURONTIN) 100 MG capsule Take 100 mg by mouth 3 (three) times daily.     Marland Kitchen LEVEMIR FLEXTOUCH 100 UNIT/ML Pen Inject 48 Units into the skin daily at 10 pm.     . lovastatin (MEVACOR) 20 MG tablet Take 20 mg by mouth daily.     . Nebivolol HCl 20 MG TABS Take 20 mg by mouth daily.    Marland Kitchen NOVOLOG 100 UNIT/ML injection Inject 12 Units into the skin daily  at 12 noon. At noon.    . pazopanib (VOTRIENT) 200 MG tablet Take 4 tablets (800 mg total) by mouth daily. Take on an empty stomach. 120 tablet 5  . tamsulosin (FLOMAX) 0.4 MG CAPS capsule Take 0.4 mg by mouth daily.      No current facility-administered medications for this visit.     OBJECTIVE: Vitals:   12/12/15 1520  BP: (!) 195/90  Pulse: 66  Resp: 18  Temp: 97.8 F (36.6 C)     Body mass index is 25.14 kg/m.    ECOG FS:0 - Asymptomatic  General: Well-developed, well-nourished, no acute distress. Eyes: Pink conjunctiva, anicteric sclera. Lungs: Clear to auscultation  bilaterally. Heart: Regular rate and rhythm. No rubs, murmurs, or gallops. Abdomen: Soft, nontender, nondistended. No organomegaly noted, normoactive bowel sounds. Musculoskeletal: No edema, cyanosis, or clubbing. Neuro: Alert, answering all questions appropriately. Cranial nerves grossly intact. Skin: No rashes or petechiae noted. Psych: Normal affect.    LAB RESULTS:  Lab Results  Component Value Date   NA 134 (L) 12/12/2015   K 4.8 12/12/2015   CL 101 12/12/2015   CO2 26 12/12/2015   GLUCOSE 141 (H) 12/12/2015   BUN 15 12/12/2015   CREATININE 1.39 (H) 12/12/2015   CALCIUM 8.8 (L) 12/12/2015   PROT 8.3 (H) 12/12/2015   ALBUMIN 4.1 12/12/2015   AST 63 (H) 12/12/2015   ALT 33 12/12/2015   ALKPHOS 205 (H) 12/12/2015   BILITOT 1.9 (H) 12/12/2015   GFRNONAA 54 (L) 12/12/2015   GFRAA >60 12/12/2015    Lab Results  Component Value Date   WBC 2.0 (L) 12/12/2015   NEUTROABS 1.1 (L) 12/12/2015   HGB 13.5 12/12/2015   HCT 39.8 (L) 12/12/2015   MCV 81.6 12/12/2015   PLT 67 (L) 12/12/2015     STUDIES: No results found.  ASSESSMENT: Stage IV left renal cell carcinoma with metastasis to the lungs  PLAN:    1. Stage IV left renal cell carcinoma with metastasis to the lungs:  PET scan results reviewed independently and reported as above highly suspicious for metastatic disease in the lung only. Biopsy confirmed recurrence of renal cell carcinoma. Patient underwent left nephrectomy on May 16, 2012 which revealed a clear cell grade 2 renal cell carcinoma, stage TIIIa, N0, M0. Tumor size of 16 cm. Patient was noted to have renal vein involvement, but no other structures were involved. 0 of 2 lymph nodes were negative for disease. Because of patient's leukopenia and thrombocytopenia, he has been instructed to hold Votrient for 2 weeks and then restart at 600 mg daily. Votrient does not require dose reduction for renal insufficiency. Return to clinic in 2 weeks for laboratory work only  and then in 6 weeks for repeat laboratory work and further evaluation. 2. Acute renal failure: Patient's creatinine remains elevated, but approximately at his baseline.  3. Anemia: Resolved.  4. Thrombocytopenia: Dose reduce Votrient as above. 5. Leukopenia: Dose reduce Votrient as above.  Patient expressed understanding and was in agreement with this plan. He also understands that He can call clinic at any time with any questions, concerns, or complaints.   Metastatic renal cell carcinoma to lung [facility])   Staging form: Kidney, AJCC 7th Edition   - Clinical stage from 10/12/2015: Stage IV (TX, N0, M1) - Signed by Lloyd Huger, MD on 10/12/2015  Lloyd Huger, MD   12/14/2015 9:56 AM

## 2015-12-12 ENCOUNTER — Inpatient Hospital Stay: Payer: Medicare HMO | Attending: Oncology | Admitting: Oncology

## 2015-12-12 ENCOUNTER — Other Ambulatory Visit: Payer: Self-pay

## 2015-12-12 ENCOUNTER — Inpatient Hospital Stay: Payer: Medicare HMO

## 2015-12-12 VITALS — BP 195/90 | HR 66 | Temp 97.8°F | Resp 18 | Wt 180.2 lb

## 2015-12-12 DIAGNOSIS — N400 Enlarged prostate without lower urinary tract symptoms: Secondary | ICD-10-CM | POA: Diagnosis not present

## 2015-12-12 DIAGNOSIS — C642 Malignant neoplasm of left kidney, except renal pelvis: Secondary | ICD-10-CM

## 2015-12-12 DIAGNOSIS — K8020 Calculus of gallbladder without cholecystitis without obstruction: Secondary | ICD-10-CM | POA: Insufficient documentation

## 2015-12-12 DIAGNOSIS — D696 Thrombocytopenia, unspecified: Secondary | ICD-10-CM | POA: Diagnosis not present

## 2015-12-12 DIAGNOSIS — K219 Gastro-esophageal reflux disease without esophagitis: Secondary | ICD-10-CM | POA: Diagnosis not present

## 2015-12-12 DIAGNOSIS — C7800 Secondary malignant neoplasm of unspecified lung: Principal | ICD-10-CM

## 2015-12-12 DIAGNOSIS — N179 Acute kidney failure, unspecified: Secondary | ICD-10-CM | POA: Diagnosis not present

## 2015-12-12 DIAGNOSIS — I129 Hypertensive chronic kidney disease with stage 1 through stage 4 chronic kidney disease, or unspecified chronic kidney disease: Secondary | ICD-10-CM | POA: Diagnosis not present

## 2015-12-12 DIAGNOSIS — K7460 Unspecified cirrhosis of liver: Secondary | ICD-10-CM | POA: Diagnosis not present

## 2015-12-12 DIAGNOSIS — C7801 Secondary malignant neoplasm of right lung: Secondary | ICD-10-CM

## 2015-12-12 DIAGNOSIS — M47814 Spondylosis without myelopathy or radiculopathy, thoracic region: Secondary | ICD-10-CM | POA: Diagnosis not present

## 2015-12-12 DIAGNOSIS — C649 Malignant neoplasm of unspecified kidney, except renal pelvis: Secondary | ICD-10-CM

## 2015-12-12 DIAGNOSIS — M109 Gout, unspecified: Secondary | ICD-10-CM | POA: Diagnosis not present

## 2015-12-12 DIAGNOSIS — D631 Anemia in chronic kidney disease: Secondary | ICD-10-CM

## 2015-12-12 DIAGNOSIS — D72819 Decreased white blood cell count, unspecified: Secondary | ICD-10-CM | POA: Diagnosis not present

## 2015-12-12 DIAGNOSIS — K760 Fatty (change of) liver, not elsewhere classified: Secondary | ICD-10-CM | POA: Diagnosis not present

## 2015-12-12 DIAGNOSIS — E1122 Type 2 diabetes mellitus with diabetic chronic kidney disease: Secondary | ICD-10-CM | POA: Insufficient documentation

## 2015-12-12 DIAGNOSIS — C7802 Secondary malignant neoplasm of left lung: Secondary | ICD-10-CM | POA: Insufficient documentation

## 2015-12-12 DIAGNOSIS — Z905 Acquired absence of kidney: Secondary | ICD-10-CM | POA: Insufficient documentation

## 2015-12-12 DIAGNOSIS — Z87891 Personal history of nicotine dependence: Secondary | ICD-10-CM | POA: Diagnosis not present

## 2015-12-12 DIAGNOSIS — Z79899 Other long term (current) drug therapy: Secondary | ICD-10-CM | POA: Insufficient documentation

## 2015-12-12 LAB — CBC WITH DIFFERENTIAL/PLATELET
BASOS PCT: 1 %
Basophils Absolute: 0 10*3/uL (ref 0–0.1)
EOS ABS: 0.1 10*3/uL (ref 0–0.7)
Eosinophils Relative: 6 %
HCT: 39.8 % — ABNORMAL LOW (ref 40.0–52.0)
Hemoglobin: 13.5 g/dL (ref 13.0–18.0)
Lymphocytes Relative: 29 %
Lymphs Abs: 0.6 10*3/uL — ABNORMAL LOW (ref 1.0–3.6)
MCH: 27.8 pg (ref 26.0–34.0)
MCHC: 34 g/dL (ref 32.0–36.0)
MCV: 81.6 fL (ref 80.0–100.0)
MONO ABS: 0.3 10*3/uL (ref 0.2–1.0)
MONOS PCT: 13 %
NEUTROS PCT: 51 %
Neutro Abs: 1.1 10*3/uL — ABNORMAL LOW (ref 1.4–6.5)
PLATELETS: 67 10*3/uL — AB (ref 150–440)
RBC: 4.88 MIL/uL (ref 4.40–5.90)
RDW: 21.4 % — AB (ref 11.5–14.5)
WBC: 2 10*3/uL — ABNORMAL LOW (ref 3.8–10.6)

## 2015-12-12 LAB — COMPREHENSIVE METABOLIC PANEL
ALBUMIN: 4.1 g/dL (ref 3.5–5.0)
ALT: 33 U/L (ref 17–63)
ANION GAP: 7 (ref 5–15)
AST: 63 U/L — ABNORMAL HIGH (ref 15–41)
Alkaline Phosphatase: 205 U/L — ABNORMAL HIGH (ref 38–126)
BUN: 15 mg/dL (ref 6–20)
CO2: 26 mmol/L (ref 22–32)
Calcium: 8.8 mg/dL — ABNORMAL LOW (ref 8.9–10.3)
Chloride: 101 mmol/L (ref 101–111)
Creatinine, Ser: 1.39 mg/dL — ABNORMAL HIGH (ref 0.61–1.24)
GFR calc Af Amer: >60 mL/min (ref >60)
GFR calc non Af Amer: 54 mL/min — ABNORMAL LOW (ref >60)
GLUCOSE: 141 mg/dL — AB (ref 65–99)
POTASSIUM: 4.8 mmol/L (ref 3.5–5.1)
SODIUM: 134 mmol/L — AB (ref 135–145)
Total Bilirubin: 1.9 mg/dL — ABNORMAL HIGH (ref 0.3–1.2)
Total Protein: 8.3 g/dL — ABNORMAL HIGH (ref 6.5–8.1)

## 2015-12-12 LAB — PHOSPHORUS: Phosphorus: 3.6 mg/dL (ref 2.5–4.6)

## 2015-12-12 LAB — MAGNESIUM: Magnesium: 2.2 mg/dL (ref 1.7–2.4)

## 2015-12-12 NOTE — Progress Notes (Signed)
Offers no complaints. Feeling well. Pt states understanding to take 4 tabs votrient ('800mg'$  total) daily. States has not missed any doses.

## 2015-12-23 LAB — ACID FAST SMEAR (AFB): ACID FAST SMEAR - AFSCU2: NEGATIVE

## 2015-12-28 ENCOUNTER — Other Ambulatory Visit: Payer: Self-pay

## 2015-12-28 ENCOUNTER — Inpatient Hospital Stay: Payer: Medicare HMO

## 2015-12-28 DIAGNOSIS — C649 Malignant neoplasm of unspecified kidney, except renal pelvis: Secondary | ICD-10-CM

## 2015-12-28 DIAGNOSIS — E1122 Type 2 diabetes mellitus with diabetic chronic kidney disease: Secondary | ICD-10-CM | POA: Diagnosis not present

## 2015-12-28 DIAGNOSIS — C7800 Secondary malignant neoplasm of unspecified lung: Principal | ICD-10-CM

## 2015-12-28 DIAGNOSIS — D696 Thrombocytopenia, unspecified: Secondary | ICD-10-CM | POA: Diagnosis not present

## 2015-12-28 DIAGNOSIS — C642 Malignant neoplasm of left kidney, except renal pelvis: Secondary | ICD-10-CM | POA: Diagnosis not present

## 2015-12-28 DIAGNOSIS — N179 Acute kidney failure, unspecified: Secondary | ICD-10-CM | POA: Diagnosis not present

## 2015-12-28 DIAGNOSIS — C7801 Secondary malignant neoplasm of right lung: Secondary | ICD-10-CM | POA: Diagnosis not present

## 2015-12-28 DIAGNOSIS — C7802 Secondary malignant neoplasm of left lung: Secondary | ICD-10-CM | POA: Diagnosis not present

## 2015-12-28 DIAGNOSIS — D72819 Decreased white blood cell count, unspecified: Secondary | ICD-10-CM | POA: Diagnosis not present

## 2015-12-28 DIAGNOSIS — I129 Hypertensive chronic kidney disease with stage 1 through stage 4 chronic kidney disease, or unspecified chronic kidney disease: Secondary | ICD-10-CM | POA: Diagnosis not present

## 2015-12-28 DIAGNOSIS — Z79899 Other long term (current) drug therapy: Secondary | ICD-10-CM | POA: Diagnosis not present

## 2015-12-28 DIAGNOSIS — D631 Anemia in chronic kidney disease: Secondary | ICD-10-CM | POA: Diagnosis not present

## 2015-12-28 LAB — CBC WITH DIFFERENTIAL/PLATELET
Basophils Absolute: 0 10*3/uL (ref 0–0.1)
Basophils Relative: 1 %
EOS PCT: 4 %
Eosinophils Absolute: 0.1 10*3/uL (ref 0–0.7)
HCT: 38.3 % — ABNORMAL LOW (ref 40.0–52.0)
Hemoglobin: 13 g/dL (ref 13.0–18.0)
LYMPHS ABS: 0.7 10*3/uL — AB (ref 1.0–3.6)
LYMPHS PCT: 31 %
MCH: 28.9 pg (ref 26.0–34.0)
MCHC: 33.9 g/dL (ref 32.0–36.0)
MCV: 85.3 fL (ref 80.0–100.0)
MONO ABS: 0.3 10*3/uL (ref 0.2–1.0)
MONOS PCT: 13 %
Neutro Abs: 1.2 10*3/uL — ABNORMAL LOW (ref 1.4–6.5)
Neutrophils Relative %: 51 %
PLATELETS: 70 10*3/uL — AB (ref 150–440)
RBC: 4.49 MIL/uL (ref 4.40–5.90)
RDW: 22.8 % — AB (ref 11.5–14.5)
WBC: 2.3 10*3/uL — ABNORMAL LOW (ref 3.8–10.6)

## 2015-12-28 LAB — COMPREHENSIVE METABOLIC PANEL
ALT: 33 U/L (ref 17–63)
AST: 62 U/L — ABNORMAL HIGH (ref 15–41)
Albumin: 3.9 g/dL (ref 3.5–5.0)
Alkaline Phosphatase: 219 U/L — ABNORMAL HIGH (ref 38–126)
Anion gap: 8 (ref 5–15)
BILIRUBIN TOTAL: 1.9 mg/dL — AB (ref 0.3–1.2)
BUN: 17 mg/dL (ref 6–20)
CHLORIDE: 102 mmol/L (ref 101–111)
CO2: 23 mmol/L (ref 22–32)
Calcium: 9.2 mg/dL (ref 8.9–10.3)
Creatinine, Ser: 1.19 mg/dL (ref 0.61–1.24)
Glucose, Bld: 116 mg/dL — ABNORMAL HIGH (ref 65–99)
POTASSIUM: 5.7 mmol/L — AB (ref 3.5–5.1)
Sodium: 133 mmol/L — ABNORMAL LOW (ref 135–145)
TOTAL PROTEIN: 8.2 g/dL — AB (ref 6.5–8.1)

## 2016-01-23 ENCOUNTER — Inpatient Hospital Stay: Payer: Medicare HMO | Attending: Oncology | Admitting: Oncology

## 2016-01-23 ENCOUNTER — Inpatient Hospital Stay: Payer: Medicare HMO

## 2016-01-23 VITALS — BP 169/91 | HR 65 | Temp 97.4°F | Wt 187.7 lb

## 2016-01-23 DIAGNOSIS — K8020 Calculus of gallbladder without cholecystitis without obstruction: Secondary | ICD-10-CM | POA: Insufficient documentation

## 2016-01-23 DIAGNOSIS — N400 Enlarged prostate without lower urinary tract symptoms: Secondary | ICD-10-CM | POA: Insufficient documentation

## 2016-01-23 DIAGNOSIS — Z87891 Personal history of nicotine dependence: Secondary | ICD-10-CM | POA: Diagnosis not present

## 2016-01-23 DIAGNOSIS — D72819 Decreased white blood cell count, unspecified: Secondary | ICD-10-CM | POA: Insufficient documentation

## 2016-01-23 DIAGNOSIS — K219 Gastro-esophageal reflux disease without esophagitis: Secondary | ICD-10-CM | POA: Insufficient documentation

## 2016-01-23 DIAGNOSIS — N179 Acute kidney failure, unspecified: Secondary | ICD-10-CM | POA: Diagnosis not present

## 2016-01-23 DIAGNOSIS — C7801 Secondary malignant neoplasm of right lung: Secondary | ICD-10-CM | POA: Insufficient documentation

## 2016-01-23 DIAGNOSIS — D631 Anemia in chronic kidney disease: Secondary | ICD-10-CM | POA: Insufficient documentation

## 2016-01-23 DIAGNOSIS — K7460 Unspecified cirrhosis of liver: Secondary | ICD-10-CM | POA: Insufficient documentation

## 2016-01-23 DIAGNOSIS — I129 Hypertensive chronic kidney disease with stage 1 through stage 4 chronic kidney disease, or unspecified chronic kidney disease: Secondary | ICD-10-CM | POA: Diagnosis not present

## 2016-01-23 DIAGNOSIS — K760 Fatty (change of) liver, not elsewhere classified: Secondary | ICD-10-CM | POA: Diagnosis not present

## 2016-01-23 DIAGNOSIS — Z79899 Other long term (current) drug therapy: Secondary | ICD-10-CM | POA: Insufficient documentation

## 2016-01-23 DIAGNOSIS — C7802 Secondary malignant neoplasm of left lung: Secondary | ICD-10-CM | POA: Insufficient documentation

## 2016-01-23 DIAGNOSIS — Z905 Acquired absence of kidney: Secondary | ICD-10-CM | POA: Diagnosis not present

## 2016-01-23 DIAGNOSIS — M47814 Spondylosis without myelopathy or radiculopathy, thoracic region: Secondary | ICD-10-CM | POA: Insufficient documentation

## 2016-01-23 DIAGNOSIS — E1122 Type 2 diabetes mellitus with diabetic chronic kidney disease: Secondary | ICD-10-CM | POA: Insufficient documentation

## 2016-01-23 DIAGNOSIS — M109 Gout, unspecified: Secondary | ICD-10-CM | POA: Insufficient documentation

## 2016-01-23 DIAGNOSIS — C649 Malignant neoplasm of unspecified kidney, except renal pelvis: Secondary | ICD-10-CM

## 2016-01-23 DIAGNOSIS — D696 Thrombocytopenia, unspecified: Secondary | ICD-10-CM | POA: Insufficient documentation

## 2016-01-23 DIAGNOSIS — C642 Malignant neoplasm of left kidney, except renal pelvis: Secondary | ICD-10-CM | POA: Insufficient documentation

## 2016-01-23 DIAGNOSIS — C7800 Secondary malignant neoplasm of unspecified lung: Principal | ICD-10-CM

## 2016-01-23 LAB — CBC WITH DIFFERENTIAL/PLATELET
Basophils Absolute: 0 10*3/uL (ref 0–0.1)
Basophils Relative: 1 %
Eosinophils Absolute: 0.1 10*3/uL (ref 0–0.7)
Eosinophils Relative: 5 %
HEMATOCRIT: 39.1 % — AB (ref 40.0–52.0)
HEMOGLOBIN: 13.5 g/dL (ref 13.0–18.0)
LYMPHS PCT: 23 %
Lymphs Abs: 0.6 10*3/uL — ABNORMAL LOW (ref 1.0–3.6)
MCH: 31.3 pg (ref 26.0–34.0)
MCHC: 34.6 g/dL (ref 32.0–36.0)
MCV: 90.6 fL (ref 80.0–100.0)
Monocytes Absolute: 0.3 10*3/uL (ref 0.2–1.0)
Monocytes Relative: 13 %
NEUTROS ABS: 1.5 10*3/uL (ref 1.4–6.5)
NEUTROS PCT: 58 %
Platelets: 88 10*3/uL — ABNORMAL LOW (ref 150–440)
RBC: 4.31 MIL/uL — ABNORMAL LOW (ref 4.40–5.90)
RDW: 17.9 % — ABNORMAL HIGH (ref 11.5–14.5)
WBC: 2.5 10*3/uL — ABNORMAL LOW (ref 3.8–10.6)

## 2016-01-23 LAB — COMPREHENSIVE METABOLIC PANEL
ALBUMIN: 3.6 g/dL (ref 3.5–5.0)
ALK PHOS: 263 U/L — AB (ref 38–126)
ALT: 28 U/L (ref 17–63)
AST: 51 U/L — AB (ref 15–41)
Anion gap: 7 (ref 5–15)
BILIRUBIN TOTAL: 1.7 mg/dL — AB (ref 0.3–1.2)
BUN: 13 mg/dL (ref 6–20)
CALCIUM: 8.9 mg/dL (ref 8.9–10.3)
CO2: 25 mmol/L (ref 22–32)
CREATININE: 1.1 mg/dL (ref 0.61–1.24)
Chloride: 101 mmol/L (ref 101–111)
GFR calc Af Amer: >60 mL/min (ref >60)
Glucose, Bld: 202 mg/dL — ABNORMAL HIGH (ref 65–99)
POTASSIUM: 5 mmol/L (ref 3.5–5.1)
Sodium: 133 mmol/L — ABNORMAL LOW (ref 135–145)
TOTAL PROTEIN: 8 g/dL (ref 6.5–8.1)

## 2016-01-23 MED ORDER — PAZOPANIB HCL 200 MG PO TABS: 600.0000 mg | ORAL_TABLET | Freq: Every day | ORAL | 5 refills | Status: DC

## 2016-01-23 NOTE — Progress Notes (Signed)
Patient denies pain or discomfort at this time.  BP 169/91

## 2016-01-23 NOTE — Progress Notes (Signed)
Rod[place] Stewart  Telephone:(336) [phone] Fax:(336) [phone]  ID: Diamantina Providence OB: 1955-11-05  MR#: [id number]  YPP#:[id number]  Patient Care Team: [facility] as PCP - General  CHIEF COMPLAINT: Stage IV renal cell carcinoma with bilateral lung metastases.  INTERVAL HISTORY: Patient returns to clinic today for further evaluation and to assess his toleration of Votrient. He reports last taking Votrient on 12/12/15, and reports no significant side effects.  He denies weakness and fatigue.  He reports chronic peripheral neuropathy in both feet, currently taking gabapentin, and has no other neurologic complaints. He reports insomnia - difficulty getting to sleep, staying asleep, and awakening early.  He reports chronic sensitivity to light since his cataract surgery in Jan 2017. He denies any fevers, chills, or recent illnesses. He has a good appetite and denies weight loss. He reports occasional shortness of breath with overexertion. He denies any chest pain, cough, or hemoptysis. He has no nausea, vomiting, constipation, or diarrhea. He has no melena or hematochezia. He has no urinary complaints. Patient offers no specific complaints today.   REVIEW OF SYSTEMS:   Review of Systems  Constitutional: Negative.  Negative for fever and malaise/fatigue.  Eyes: Positive for photophobia and redness. Negative for blurred vision.  Respiratory: Positive for shortness of breath. Negative for cough.   Cardiovascular: Negative.  Negative for chest pain and leg swelling.  Gastrointestinal: Negative.  Negative for abdominal pain, constipation, diarrhea, nausea and vomiting.  Genitourinary: Negative.   Musculoskeletal: Negative.   Neurological: Positive for tingling and sensory change. Negative for weakness and headaches.  Psychiatric/Behavioral: The patient has insomnia. The patient is not nervous/anxious.    As per HPI. Otherwise, a complete review of systems is negative.   PAST  MEDICAL HISTORY: Past Medical History:  Diagnosis Date  . Anemia   . BPH (benign prostatic hyperplasia)   . CKD (chronic kidney disease) stage 3, GFR 30-59 ml/min   . Clear cell renal cell carcinoma (HCC)   . Diabetes mellitus without complication ([place])   . Dyspnea    with exertion  . GERD (gastroesophageal reflux disease)   . History of gout   . History of nephrectomy    Left  . Hypertension   . Neuropathy ([place])   . Umbilical hernia     PAST SURGICAL HISTORY: Past Surgical History:  Procedure Laterality Date  . BACK SURGERY    . ENDOBRONCHIAL ULTRASOUND N/A 10/14/2015   Procedure: ENDOBRONCHIAL ULTRASOUND;  Surgeon: [name], MD;  Location: [facility] ORS;  Service: Pulmonary;  Laterality: N/A;  . EYE SURGERY Bilateral    Cataract Extraction with IOL  . KNEE SURGERY Right   . NEPHRECTOMY      FAMILY HISTORY: Family History  Problem Relation Age of Onset  . Cancer Mother   . Diabetes Father   . Hypertension Father     ADVANCED DIRECTIVES (Y/N):  N  HEALTH MAINTENANCE: Social History  Substance Use Topics  . Smoking status: Former Smoker    Packs/day: 1.00    Types: Cigarettes  . Smokeless tobacco: Never Used  . Alcohol use 25.2 oz/week    42 Cans of beer per week     Comment: 5-6 cans a day     Colonoscopy:  PAP:  Bone density:  Lipid panel:  No Known Allergies  Current Outpatient Prescriptions  Medication Sig Dispense Refill  . amLODipine (NORVASC) 10 MG tablet 10 mg.    . colchicine 0.6 MG tablet Take 0.6 mg by mouth every other  day.     . gabapentin (NEURONTIN) 100 MG capsule Take 100 mg by mouth 3 (three) times daily.     Marland Kitchen LEVEMIR FLEXTOUCH 100 UNIT/ML Pen Inject 48 Units into the skin daily at 10 pm.     . lovastatin (MEVACOR) 20 MG tablet Take 20 mg by mouth daily.     . Nebivolol HCl 20 MG TABS Take 20 mg by mouth daily.    Marland Kitchen NOVOLOG 100 UNIT/ML injection Inject 12 Units into the skin daily at 12 noon. At noon.    . tamsulosin  (FLOMAX) 0.4 MG CAPS capsule Take 0.4 mg by mouth daily.     . pazopanib (VOTRIENT) 200 MG tablet Take 3 tablets (600 mg total) by mouth daily. Take on an empty stomach. 90 tablet 5   No current facility-administered medications for this visit.     OBJECTIVE: Vitals:   01/23/16 1452  BP: (!) 169/91  Pulse: 65  Temp: 97.4 F (36.3 C)     Body mass index is 26.18 kg/m.    ECOG FS:0 - Asymptomatic  General: Well-developed, well-nourished, no acute distress. Eyes: Pink conjunctiva, anicteric sclera. Lungs: Clear to auscultation bilaterally. Heart: Regular rate and rhythm. No rubs, murmurs, or gallops. Abdomen: Soft, nontender, nondistended. No organomegaly noted, normoactive bowel sounds. Musculoskeletal: No edema, cyanosis, or clubbing. Neuro: Alert, answering all questions appropriately. Cranial nerves grossly intact. Skin: No rashes or petechiae noted. Psych: Normal affect.    LAB RESULTS:  Lab Results  Component Value Date   NA 133 (L) 01/23/2016   K 5.0 01/23/2016   CL 101 01/23/2016   CO2 25 01/23/2016   GLUCOSE 202 (H) 01/23/2016   BUN 13 01/23/2016   CREATININE 1.10 01/23/2016   CALCIUM 8.9 01/23/2016   PROT 8.0 01/23/2016   ALBUMIN 3.6 01/23/2016   AST 51 (H) 01/23/2016   ALT 28 01/23/2016   ALKPHOS 263 (H) 01/23/2016   BILITOT 1.7 (H) 01/23/2016   GFRNONAA >60 01/23/2016   GFRAA >60 01/23/2016    Lab Results  Component Value Date   WBC 2.5 (L) 01/23/2016   NEUTROABS 1.5 01/23/2016   HGB 13.5 01/23/2016   HCT 39.1 (L) 01/23/2016   MCV 90.6 01/23/2016   PLT 88 (L) 01/23/2016     STUDIES: No results found.  ASSESSMENT: Stage IV left renal cell carcinoma with metastasis to the lungs  PLAN:    1. Stage IV left renal cell carcinoma with metastasis to the lungs:  PET scan results reviewed independently and reported highly suspicious for metastatic disease in the lung only. Biopsy confirmed recurrence of renal cell carcinoma. Patient underwent left  nephrectomy on May 16, 2012 which revealed a clear cell grade 2 renal cell carcinoma, stage TIIIa, N0, M0. Tumor size of 16 cm. Patient was noted to have renal vein involvement, but no other structures were involved. 0 of 2 lymph nodes were negative for disease. Because of patient's leukopenia and thrombocytopenia, he had been instructed on 12/12/15 to hold Votrient for 2 weeks and then restart at 600 mg daily.  Patient took last Votrient dose on 12/12/15.  Votrient does not require dose reduction for renal insufficiency. Restart Votrient at '600mg'$  daily starting 01/24/16.  Return to clinic in 2 weeks for laboratory work only and then in 4 weeks for repeat laboratory work and further evaluation. 2. Acute renal failure: Patient's creatinine is currently within normal limits. Monitor. 3. Anemia: Resolved.  4. Thrombocytopenia: Dose reduce Votrient as above. 5. Leukopenia: Dose reduce Votrient  as above. 6. Peripheral neuropathy: Likely secondary to diabetes, currently taking gabapentin. Monitor. 7. Insomia: Discussed 4-7-8 breathing technique at bedtime: breathe in to count of 4, hold breath for count of 7, exhale for count of 8; do 3-5 times for letting go of overactive thoughts.   Patient expressed understanding and was in agreement with this plan. He also understands that He can call clinic at any time with any questions, concerns, or complaints.   Cancer Staging Metastatic renal cell carcinoma to lung [facility]) Staging form: Kidney, AJCC 7th Edition - Clinical stage from 10/12/2015: Stage IV (TX, N0, M1) - Signed by Lloyd Huger, MD on 10/12/2015  Lucendia Herrlich, NP  01/23/16 4:16 PM  Patient was seen and evaluated independently and I agree with the assessment and plan as dictated above.  Lloyd Huger, MD 01/25/16 9:06 AM

## 2016-01-30 ENCOUNTER — Ambulatory Visit: Payer: Medicare HMO | Admitting: Oncology

## 2016-01-30 ENCOUNTER — Telehealth: Payer: Self-pay | Admitting: *Deleted

## 2016-01-30 ENCOUNTER — Other Ambulatory Visit: Payer: Medicare HMO

## 2016-01-30 NOTE — Telephone Encounter (Signed)
Pt has high copay for votrient. No copay assistance is available at this time but pt can apply for manufacturer assitance. Application form to be faxed to complete for patient.

## 2016-02-01 ENCOUNTER — Telehealth: Payer: Self-pay | Admitting: *Deleted

## 2016-02-01 NOTE — Telephone Encounter (Signed)
Pt will need assistance with votrient due to high copay. Left voicemail with Opal Sidles at [employer] to fax manufacturer assitance application form to the cancer center. Fax number [phone] provided. Awaiting application form.

## 2016-02-06 ENCOUNTER — Inpatient Hospital Stay: Payer: Medicare HMO | Attending: Oncology

## 2016-02-06 DIAGNOSIS — C7800 Secondary malignant neoplasm of unspecified lung: Secondary | ICD-10-CM | POA: Insufficient documentation

## 2016-02-06 DIAGNOSIS — M109 Gout, unspecified: Secondary | ICD-10-CM | POA: Diagnosis not present

## 2016-02-06 DIAGNOSIS — I129 Hypertensive chronic kidney disease with stage 1 through stage 4 chronic kidney disease, or unspecified chronic kidney disease: Secondary | ICD-10-CM | POA: Diagnosis not present

## 2016-02-06 DIAGNOSIS — Z905 Acquired absence of kidney: Secondary | ICD-10-CM | POA: Diagnosis not present

## 2016-02-06 DIAGNOSIS — G629 Polyneuropathy, unspecified: Secondary | ICD-10-CM | POA: Diagnosis not present

## 2016-02-06 DIAGNOSIS — N183 Chronic kidney disease, stage 3 (moderate): Secondary | ICD-10-CM | POA: Insufficient documentation

## 2016-02-06 DIAGNOSIS — Z794 Long term (current) use of insulin: Secondary | ICD-10-CM | POA: Insufficient documentation

## 2016-02-06 DIAGNOSIS — K219 Gastro-esophageal reflux disease without esophagitis: Secondary | ICD-10-CM | POA: Diagnosis not present

## 2016-02-06 DIAGNOSIS — C642 Malignant neoplasm of left kidney, except renal pelvis: Secondary | ICD-10-CM | POA: Diagnosis present

## 2016-02-06 DIAGNOSIS — Z87891 Personal history of nicotine dependence: Secondary | ICD-10-CM | POA: Insufficient documentation

## 2016-02-06 DIAGNOSIS — Z79899 Other long term (current) drug therapy: Secondary | ICD-10-CM | POA: Diagnosis not present

## 2016-02-06 DIAGNOSIS — C7802 Secondary malignant neoplasm of left lung: Secondary | ICD-10-CM

## 2016-02-06 DIAGNOSIS — D61818 Other pancytopenia: Secondary | ICD-10-CM | POA: Insufficient documentation

## 2016-02-06 DIAGNOSIS — N400 Enlarged prostate without lower urinary tract symptoms: Secondary | ICD-10-CM | POA: Insufficient documentation

## 2016-02-06 DIAGNOSIS — E1122 Type 2 diabetes mellitus with diabetic chronic kidney disease: Secondary | ICD-10-CM | POA: Insufficient documentation

## 2016-02-06 DIAGNOSIS — C649 Malignant neoplasm of unspecified kidney, except renal pelvis: Secondary | ICD-10-CM

## 2016-02-06 LAB — CBC WITH DIFFERENTIAL/PLATELET
BASOS ABS: 0 10*3/uL (ref 0–0.1)
BASOS PCT: 1 %
Eosinophils Absolute: 0.1 10*3/uL (ref 0–0.7)
Eosinophils Relative: 5 %
HEMATOCRIT: 39 % — AB (ref 40.0–52.0)
Hemoglobin: 14 g/dL (ref 13.0–18.0)
LYMPHS PCT: 23 %
Lymphs Abs: 0.7 10*3/uL — ABNORMAL LOW (ref 1.0–3.6)
MCH: 32.4 pg (ref 26.0–34.0)
MCHC: 35.9 g/dL (ref 32.0–36.0)
MCV: 90.3 fL (ref 80.0–100.0)
MONO ABS: 0.3 10*3/uL (ref 0.2–1.0)
Monocytes Relative: 11 %
NEUTROS ABS: 1.8 10*3/uL (ref 1.4–6.5)
Neutrophils Relative %: 60 %
PLATELETS: 100 10*3/uL — AB (ref 150–440)
RBC: 4.32 MIL/uL — AB (ref 4.40–5.90)
RDW: 15.6 % — AB (ref 11.5–14.5)
WBC: 2.9 10*3/uL — AB (ref 3.8–10.6)

## 2016-02-06 LAB — COMPREHENSIVE METABOLIC PANEL
ALBUMIN: 3.4 g/dL — AB (ref 3.5–5.0)
ALT: 32 U/L (ref 17–63)
AST: 56 U/L — AB (ref 15–41)
Alkaline Phosphatase: 242 U/L — ABNORMAL HIGH (ref 38–126)
Anion gap: 6 (ref 5–15)
BILIRUBIN TOTAL: 3.1 mg/dL — AB (ref 0.3–1.2)
BUN: 15 mg/dL (ref 6–20)
CO2: 24 mmol/L (ref 22–32)
CREATININE: 1.27 mg/dL — AB (ref 0.61–1.24)
Calcium: 8.5 mg/dL — ABNORMAL LOW (ref 8.9–10.3)
Chloride: 103 mmol/L (ref 101–111)
GFR calc Af Amer: >60 mL/min (ref >60)
GFR calc non Af Amer: >60 mL/min (ref >60)
GLUCOSE: 184 mg/dL — AB (ref 65–99)
POTASSIUM: 4.8 mmol/L (ref 3.5–5.1)
Sodium: 133 mmol/L — ABNORMAL LOW (ref 135–145)
Total Protein: 7.9 g/dL (ref 6.5–8.1)

## 2016-02-19 NOTE — Progress Notes (Signed)
[place]  Telephone:(336) [phone] Fax:(336) [phone]  ID: Diamantina Providence OB: 11/22/1955  MR#: [id number]  ALP#:[id number]  Patient Care Team: [facility] as PCP - General  CHIEF COMPLAINT: Stage IV renal cell carcinoma with bilateral lung metastases.  INTERVAL HISTORY: Patient returns to clinic today for further evaluation and laboratory work. He continues to tolerate dose reduced Votrient without significant side effects.  He reports chronic peripheral neuropathy in both feet, currently taking gabapentin, and has no other neurologic complaints. He denies any fevers, chills, or recent illnesses. He has a good appetite and denies weight loss. He denies any chest pain, shortness of breath, cough, or hemoptysis. He has no nausea, vomiting, constipation, or diarrhea. He has no melena or hematochezia. He has no urinary complaints. Patient offers no specific complaints today.   REVIEW OF SYSTEMS:   Review of Systems  Constitutional: Negative.  Negative for fever, malaise/fatigue and weight loss.  Eyes: Negative for blurred vision.  Respiratory: Negative for cough and shortness of breath.   Cardiovascular: Negative.  Negative for chest pain and leg swelling.  Gastrointestinal: Negative.  Negative for abdominal pain, constipation, diarrhea, nausea and vomiting.  Genitourinary: Negative.   Musculoskeletal: Negative.   Neurological: Positive for tingling and sensory change. Negative for weakness and headaches.  Psychiatric/Behavioral: Negative.  The patient is not nervous/anxious and does not have insomnia.    As per HPI. Otherwise, a complete review of systems is negative.   PAST MEDICAL HISTORY: Past Medical History:  Diagnosis Date  . Anemia   . BPH (benign prostatic hyperplasia)   . CKD (chronic kidney disease) stage 3, GFR 30-59 ml/min   . Clear cell renal cell carcinoma (HCC)   . Diabetes mellitus without complication ([place])   . Dyspnea    with exertion    . GERD (gastroesophageal reflux disease)   . History of gout   . History of nephrectomy    Left  . Hypertension   . Neuropathy ([place])   . Renal cell carcinoma of left kidney (HCC)    mets to lungs  . Umbilical hernia     PAST SURGICAL HISTORY: Past Surgical History:  Procedure Laterality Date  . BACK SURGERY    . ENDOBRONCHIAL ULTRASOUND N/A 10/14/2015   Procedure: ENDOBRONCHIAL ULTRASOUND;  Surgeon: [name], MD;  Location: [facility] ORS;  Service: Pulmonary;  Laterality: N/A;  . EYE SURGERY Bilateral    Cataract Extraction with IOL  . KNEE SURGERY Right   . NEPHRECTOMY      FAMILY HISTORY: Family History  Problem Relation Age of Onset  . Ovarian cancer Mother   . Diabetes Father   . Hypertension Father     ADVANCED DIRECTIVES (Y/N):  N  HEALTH MAINTENANCE: Social History  Substance Use Topics  . Smoking status: Former Smoker    Packs/day: 1.00    Types: Cigarettes  . Smokeless tobacco: Never Used  . Alcohol use 25.2 oz/week    42 Cans of beer per week     Comment: 4-5 cans of beer a day     Colonoscopy:  PAP:  Bone density:  Lipid panel:  No Known Allergies  Current Outpatient Prescriptions  Medication Sig Dispense Refill  . amLODipine (NORVASC) 10 MG tablet Take 10 mg by mouth daily.     . colchicine 0.6 MG tablet Take 0.6 mg by mouth every other day.     . gabapentin (NEURONTIN) 100 MG capsule Take 100 mg by mouth 3 (three) times daily.     Marland Kitchen  LEVEMIR FLEXTOUCH 100 UNIT/ML Pen Inject 48 Units into the skin daily at 10 pm.     . lovastatin (MEVACOR) 20 MG tablet Take 20 mg by mouth daily.     . Nebivolol HCl 20 MG TABS Take 20 mg by mouth daily.    Marland Kitchen NOVOLOG 100 UNIT/ML injection Inject 12 Units into the skin daily at 12 noon. At noon.    . pazopanib (VOTRIENT) 200 MG tablet Take 3 tablets (600 mg total) by mouth daily. Take on an empty stomach. 90 tablet 5  . tamsulosin (FLOMAX) 0.4 MG CAPS capsule Take 0.4 mg by mouth daily.      No current  facility-administered medications for this visit.     OBJECTIVE: Vitals:   02/20/16 1205  BP: (!) 175/85  Pulse: 63  Resp: 18  Temp: 97.7 F (36.5 C)     Body mass index is 26.38 kg/m.    ECOG FS:0 - Asymptomatic  General: Well-developed, well-nourished, no acute distress. Eyes: Pink conjunctiva, anicteric sclera. Lungs: Clear to auscultation bilaterally. Heart: Regular rate and rhythm. No rubs, murmurs, or gallops. Abdomen: Soft, nontender, nondistended. No organomegaly noted, normoactive bowel sounds. Musculoskeletal: No edema, cyanosis, or clubbing. Neuro: Alert, answering all questions appropriately. Cranial nerves grossly intact. Skin: No rashes or petechiae noted. Psych: Normal affect.    LAB RESULTS:  Lab Results  Component Value Date   NA 131 (L) 02/20/2016   K 4.7 02/20/2016   CL 100 (L) 02/20/2016   CO2 23 02/20/2016   GLUCOSE 190 (H) 02/20/2016   BUN 15 02/20/2016   CREATININE 1.31 (H) 02/20/2016   CALCIUM 8.4 (L) 02/20/2016   PROT 8.3 (H) 02/20/2016   ALBUMIN 3.6 02/20/2016   AST 56 (H) 02/20/2016   ALT 31 02/20/2016   ALKPHOS 247 (H) 02/20/2016   BILITOT 3.2 (H) 02/20/2016   GFRNONAA 58 (L) 02/20/2016   GFRAA >60 02/20/2016    Lab Results  Component Value Date   WBC 2.5 (L) 02/20/2016   NEUTROABS 1.3 (L) 02/20/2016   HGB 13.9 02/20/2016   HCT 38.4 (L) 02/20/2016   MCV 92.9 02/20/2016   PLT 83 (L) 02/20/2016     STUDIES: No results found.  ASSESSMENT: Stage IV left renal cell carcinoma with metastasis to the lungs  PLAN:    1. Stage IV left renal cell carcinoma with metastasis to the lungs:  PET scan results reviewed independently  highly suspicious for metastatic disease in the lung only. Biopsy confirmed recurrence of renal cell carcinoma. Patient underwent left nephrectomy on May 16, 2012 which revealed a clear cell grade 2 renal cell carcinoma, stage TIIIa, N0, M0. Tumor size of 16 cm. Patient was noted to have renal vein involvement,  but no other structures were involved. 0 of 2 lymph nodes were negative for disease. Votrient Has been dose reduced to 600 mg daily secondary to pancytopenia. Continue current treatment and return to clinic in 2 weeks for laboratory work and then in 4 weeks for repeat imaging with PET scan and further evaluation. Votrient does not require dose reduction for renal insufficiency.  2. Acute renal failure: Patient's creatinine is currently within normal limits. Monitor. 3. Anemia: Resolved.  4. Thrombocytopenia: Dose reduce Votrient as above. 5. Leukopenia: Dose reduce Votrient as above. 6. Peripheral neuropathy: Likely secondary to diabetes, currently taking gabapentin. Monitor.   Patient expressed understanding and was in agreement with this plan. He also understands that He can call clinic at any time with any questions, concerns, or  complaints.   Cancer Staging Metastatic renal cell carcinoma to lung [facility]) Staging form: Kidney, AJCC 7th Edition - Clinical stage from 10/12/2015: Stage IV (TX, N0, M1) - Signed by Lloyd Huger, MD on 10/12/2015   Lloyd Huger, MD 02/21/16 4:42 PM

## 2016-02-20 ENCOUNTER — Inpatient Hospital Stay: Payer: Medicare HMO

## 2016-02-20 ENCOUNTER — Inpatient Hospital Stay (HOSPITAL_BASED_OUTPATIENT_CLINIC_OR_DEPARTMENT_OTHER): Payer: Medicare HMO | Admitting: Oncology

## 2016-02-20 ENCOUNTER — Encounter: Payer: Self-pay | Admitting: Oncology

## 2016-02-20 VITALS — BP 175/85 | HR 63 | Temp 97.7°F | Resp 18 | Ht 71.0 in | Wt 189.2 lb

## 2016-02-20 DIAGNOSIS — C642 Malignant neoplasm of left kidney, except renal pelvis: Secondary | ICD-10-CM | POA: Diagnosis not present

## 2016-02-20 DIAGNOSIS — I129 Hypertensive chronic kidney disease with stage 1 through stage 4 chronic kidney disease, or unspecified chronic kidney disease: Secondary | ICD-10-CM | POA: Diagnosis not present

## 2016-02-20 DIAGNOSIS — N183 Chronic kidney disease, stage 3 (moderate): Secondary | ICD-10-CM | POA: Diagnosis not present

## 2016-02-20 DIAGNOSIS — D61818 Other pancytopenia: Secondary | ICD-10-CM

## 2016-02-20 DIAGNOSIS — Z794 Long term (current) use of insulin: Secondary | ICD-10-CM | POA: Diagnosis not present

## 2016-02-20 DIAGNOSIS — E1122 Type 2 diabetes mellitus with diabetic chronic kidney disease: Secondary | ICD-10-CM | POA: Diagnosis not present

## 2016-02-20 DIAGNOSIS — C7802 Secondary malignant neoplasm of left lung: Principal | ICD-10-CM

## 2016-02-20 DIAGNOSIS — C649 Malignant neoplasm of unspecified kidney, except renal pelvis: Secondary | ICD-10-CM

## 2016-02-20 DIAGNOSIS — Z961 Presence of intraocular lens: Secondary | ICD-10-CM | POA: Diagnosis not present

## 2016-02-20 DIAGNOSIS — Z79899 Other long term (current) drug therapy: Secondary | ICD-10-CM

## 2016-02-20 DIAGNOSIS — M109 Gout, unspecified: Secondary | ICD-10-CM | POA: Diagnosis not present

## 2016-02-20 DIAGNOSIS — C7800 Secondary malignant neoplasm of unspecified lung: Secondary | ICD-10-CM | POA: Diagnosis not present

## 2016-02-20 DIAGNOSIS — E113391 Type 2 diabetes mellitus with moderate nonproliferative diabetic retinopathy without macular edema, right eye: Secondary | ICD-10-CM | POA: Diagnosis not present

## 2016-02-20 DIAGNOSIS — G629 Polyneuropathy, unspecified: Secondary | ICD-10-CM

## 2016-02-20 DIAGNOSIS — K219 Gastro-esophageal reflux disease without esophagitis: Secondary | ICD-10-CM | POA: Diagnosis not present

## 2016-02-20 LAB — COMPREHENSIVE METABOLIC PANEL
ALBUMIN: 3.6 g/dL (ref 3.5–5.0)
ALT: 31 U/L (ref 17–63)
ANION GAP: 8 (ref 5–15)
AST: 56 U/L — ABNORMAL HIGH (ref 15–41)
Alkaline Phosphatase: 247 U/L — ABNORMAL HIGH (ref 38–126)
BUN: 15 mg/dL (ref 6–20)
CALCIUM: 8.4 mg/dL — AB (ref 8.9–10.3)
CHLORIDE: 100 mmol/L — AB (ref 101–111)
CO2: 23 mmol/L (ref 22–32)
Creatinine, Ser: 1.31 mg/dL — ABNORMAL HIGH (ref 0.61–1.24)
GFR calc non Af Amer: 58 mL/min — ABNORMAL LOW (ref >60)
GLUCOSE: 190 mg/dL — AB (ref 65–99)
Potassium: 4.7 mmol/L (ref 3.5–5.1)
SODIUM: 131 mmol/L — AB (ref 135–145)
Total Bilirubin: 3.2 mg/dL — ABNORMAL HIGH (ref 0.3–1.2)
Total Protein: 8.3 g/dL — ABNORMAL HIGH (ref 6.5–8.1)

## 2016-02-20 LAB — CBC WITH DIFFERENTIAL/PLATELET
BASOS PCT: 1 %
Basophils Absolute: 0 10*3/uL (ref 0.0–0.1)
EOS ABS: 0.2 10*3/uL (ref 0.0–0.7)
EOS PCT: 7 %
HCT: 38.4 % — ABNORMAL LOW (ref 39.0–52.0)
Hemoglobin: 13.9 g/dL (ref 13.0–17.0)
Lymphocytes Relative: 28 %
Lymphs Abs: 0.7 10*3/uL (ref 0.7–4.0)
MCH: 33.6 pg (ref 26.0–34.0)
MCHC: 36.2 g/dL — AB (ref 30.0–36.0)
MCV: 92.9 fL (ref 78.0–100.0)
MONOS PCT: 15 %
Monocytes Absolute: 0.4 10*3/uL (ref 0.1–1.0)
NEUTROS PCT: 50 %
Neutro Abs: 1.3 10*3/uL — ABNORMAL LOW (ref 1.7–7.7)
PLATELETS: 83 10*3/uL — AB (ref 150–400)
RBC: 4.14 MIL/uL — AB (ref 4.22–5.81)
RDW: 15.4 % (ref 11.5–15.5)
WBC: 2.5 10*3/uL — AB (ref 4.0–10.5)

## 2016-02-20 NOTE — Progress Notes (Signed)
Pt doing ok, needs help with copay asst with votrient. And I told him I would speak to [facility] about it and they have the paper and will get him to sign today

## 2016-02-29 DIAGNOSIS — R69 Illness, unspecified: Secondary | ICD-10-CM | POA: Diagnosis not present

## 2016-03-05 ENCOUNTER — Inpatient Hospital Stay: Payer: Medicare HMO | Attending: Oncology

## 2016-03-05 DIAGNOSIS — C649 Malignant neoplasm of unspecified kidney, except renal pelvis: Secondary | ICD-10-CM

## 2016-03-05 DIAGNOSIS — Z794 Long term (current) use of insulin: Secondary | ICD-10-CM | POA: Insufficient documentation

## 2016-03-05 DIAGNOSIS — N400 Enlarged prostate without lower urinary tract symptoms: Secondary | ICD-10-CM | POA: Insufficient documentation

## 2016-03-05 DIAGNOSIS — Z905 Acquired absence of kidney: Secondary | ICD-10-CM | POA: Diagnosis not present

## 2016-03-05 DIAGNOSIS — D61818 Other pancytopenia: Secondary | ICD-10-CM | POA: Diagnosis not present

## 2016-03-05 DIAGNOSIS — K219 Gastro-esophageal reflux disease without esophagitis: Secondary | ICD-10-CM | POA: Insufficient documentation

## 2016-03-05 DIAGNOSIS — C642 Malignant neoplasm of left kidney, except renal pelvis: Secondary | ICD-10-CM | POA: Diagnosis present

## 2016-03-05 DIAGNOSIS — I129 Hypertensive chronic kidney disease with stage 1 through stage 4 chronic kidney disease, or unspecified chronic kidney disease: Secondary | ICD-10-CM | POA: Insufficient documentation

## 2016-03-05 DIAGNOSIS — N183 Chronic kidney disease, stage 3 (moderate): Secondary | ICD-10-CM | POA: Insufficient documentation

## 2016-03-05 DIAGNOSIS — Z87891 Personal history of nicotine dependence: Secondary | ICD-10-CM | POA: Diagnosis not present

## 2016-03-05 DIAGNOSIS — E1122 Type 2 diabetes mellitus with diabetic chronic kidney disease: Secondary | ICD-10-CM | POA: Insufficient documentation

## 2016-03-05 DIAGNOSIS — C7802 Secondary malignant neoplasm of left lung: Secondary | ICD-10-CM | POA: Diagnosis not present

## 2016-03-05 DIAGNOSIS — K5730 Diverticulosis of large intestine without perforation or abscess without bleeding: Secondary | ICD-10-CM | POA: Diagnosis not present

## 2016-03-05 DIAGNOSIS — I700 Atherosclerosis of aorta: Secondary | ICD-10-CM | POA: Diagnosis not present

## 2016-03-05 DIAGNOSIS — M109 Gout, unspecified: Secondary | ICD-10-CM | POA: Insufficient documentation

## 2016-03-05 DIAGNOSIS — Z79899 Other long term (current) drug therapy: Secondary | ICD-10-CM | POA: Insufficient documentation

## 2016-03-05 DIAGNOSIS — G629 Polyneuropathy, unspecified: Secondary | ICD-10-CM | POA: Diagnosis not present

## 2016-03-05 LAB — COMPREHENSIVE METABOLIC PANEL
ALT: 29 U/L (ref 17–63)
AST: 56 U/L — ABNORMAL HIGH (ref 15–41)
Albumin: 3.8 g/dL (ref 3.5–5.0)
Alkaline Phosphatase: 225 U/L — ABNORMAL HIGH (ref 38–126)
Anion gap: 6 (ref 5–15)
BUN: 18 mg/dL (ref 6–20)
CHLORIDE: 102 mmol/L (ref 101–111)
CO2: 26 mmol/L (ref 22–32)
CREATININE: 1.43 mg/dL — AB (ref 0.61–1.24)
Calcium: 8.8 mg/dL — ABNORMAL LOW (ref 8.9–10.3)
GFR calc non Af Amer: 52 mL/min — ABNORMAL LOW (ref >60)
Glucose, Bld: 185 mg/dL — ABNORMAL HIGH (ref 65–99)
Potassium: 5 mmol/L (ref 3.5–5.1)
SODIUM: 134 mmol/L — AB (ref 135–145)
Total Bilirubin: 4.6 mg/dL — ABNORMAL HIGH (ref 0.3–1.2)
Total Protein: 8.4 g/dL — ABNORMAL HIGH (ref 6.5–8.1)

## 2016-03-05 LAB — CBC WITH DIFFERENTIAL/PLATELET
BASOS ABS: 0.1 10*3/uL (ref 0–0.1)
BASOS PCT: 4 %
EOS ABS: 0.2 10*3/uL (ref 0–0.7)
EOS PCT: 6 %
HCT: 37.8 % — ABNORMAL LOW (ref 40.0–52.0)
Hemoglobin: 13.5 g/dL (ref 13.0–18.0)
Lymphocytes Relative: 20 %
Lymphs Abs: 0.6 10*3/uL — ABNORMAL LOW (ref 1.0–3.6)
MCH: 34.1 pg — ABNORMAL HIGH (ref 26.0–34.0)
MCHC: 35.7 g/dL (ref 32.0–36.0)
MCV: 95.4 fL (ref 80.0–100.0)
Monocytes Absolute: 0.3 10*3/uL (ref 0.2–1.0)
Monocytes Relative: 12 %
NEUTROS PCT: 58 %
Neutro Abs: 1.7 10*3/uL (ref 1.4–6.5)
Platelets: 78 10*3/uL — ABNORMAL LOW (ref 150–440)
RBC: 3.96 MIL/uL — AB (ref 4.40–5.90)
RDW: 17.1 % — ABNORMAL HIGH (ref 11.5–14.5)
WBC: 2.9 10*3/uL — AB (ref 3.8–10.6)

## 2016-03-21 ENCOUNTER — Encounter
Admission: RE | Admit: 2016-03-21 | Discharge: 2016-03-21 | Disposition: A | Discharge status: Home / Self Care | Payer: Medicare HMO | Source: Ambulatory Visit | Attending: Oncology | Admitting: Oncology

## 2016-03-21 DIAGNOSIS — C771 Secondary and unspecified malignant neoplasm of intrathoracic lymph nodes: Secondary | ICD-10-CM | POA: Diagnosis not present

## 2016-03-21 DIAGNOSIS — C649 Malignant neoplasm of unspecified kidney, except renal pelvis: Secondary | ICD-10-CM | POA: Diagnosis not present

## 2016-03-21 DIAGNOSIS — C7802 Secondary malignant neoplasm of left lung: Secondary | ICD-10-CM | POA: Insufficient documentation

## 2016-03-21 DIAGNOSIS — C7800 Secondary malignant neoplasm of unspecified lung: Secondary | ICD-10-CM | POA: Diagnosis not present

## 2016-03-21 LAB — GLUCOSE, CAPILLARY: GLUCOSE-CAPILLARY: 110 mg/dL — AB (ref 65–99)

## 2016-03-21 MED ORDER — FLUDEOXYGLUCOSE F - 18 (FDG) INJECTION
12.0000 | Freq: Once | INTRAVENOUS | Status: AC | PRN
  Administered 2016-03-21: 12.76 via INTRAVENOUS

## 2016-03-25 NOTE — Progress Notes (Signed)
[name]  [phone] [phone]) [phone]  ID: Rodney Stewart OB: [birth year]  MR#: [id number]  IZT#:[id number]  Patient Care Team: [facility] as PCP - General  CHIEF COMPLAINT: Stage IV renal cell carcinoma with bilateral lung metastases.  INTERVAL HISTORY: Patient returns to clinic today for further evaluation and discussion of his imaging results. He continues to tolerate dose reduced Votrient without significant side effects.  He reports that he is compliant with treatment and has not missed a dose. He has chronic peripheral neuropathy in both feet, currently taking gabapentin, and has no other neurologic complaints. He denies any fevers, chills, or recent illnesses. He has a good appetite and denies weight loss. He denies any chest pain, shortness of breath, cough, or hemoptysis. He has no nausea, vomiting, constipation, or diarrhea. He has no melena or hematochezia. He has no urinary complaints. Patient offers no specific complaints today.   REVIEW OF SYSTEMS:   Review of Systems  Constitutional: Negative.  Negative for fever, malaise/fatigue and weight loss.  Eyes: Negative for blurred vision.  Respiratory: Negative for cough and shortness of breath.   Cardiovascular: Negative.  Negative for chest pain and leg swelling.  Gastrointestinal: Negative.  Negative for abdominal pain, constipation, diarrhea, nausea and vomiting.  Genitourinary: Negative.   Musculoskeletal: Negative.   Neurological: Positive for tingling and sensory change. Negative for weakness and headaches.  Psychiatric/Behavioral: Negative.  The patient is not nervous/anxious and does not have insomnia.    As per HPI. Otherwise, a complete review of systems is negative.   PAST MEDICAL HISTORY: Past Medical History:  Diagnosis Date  . Anemia   . BPH (benign prostatic hyperplasia)   . CKD (chronic kidney disease) stage 3, GFR 30-59 ml/min   . Clear cell renal cell carcinoma  (HCC)   . Diabetes mellitus without complication ([place])   . Dyspnea    with exertion  . GERD (gastroesophageal reflux disease)   . History of gout   . History of nephrectomy    Left  . Hypertension   . Neuropathy ([place])   . Renal cell carcinoma of left kidney (HCC)    mets to lungs  . Umbilical hernia     PAST SURGICAL HISTORY: Past Surgical History:  Procedure Laterality Date  . BACK SURGERY    . ENDOBRONCHIAL ULTRASOUND N/A 10/14/2015   Procedure: ENDOBRONCHIAL ULTRASOUND;  Surgeon: [name], MD;  Location: [facility] ORS;  Service: Pulmonary;  Laterality: N/A;  . EYE SURGERY Bilateral    Cataract Extraction with IOL  . KNEE SURGERY Right   . NEPHRECTOMY      FAMILY HISTORY: Family History  Problem Relation Age of Onset  . Ovarian cancer Mother   . Diabetes Father   . Hypertension Father     ADVANCED DIRECTIVES (Y/N):  N  HEALTH MAINTENANCE: Social History  Substance Use Topics  . Smoking status: Former Smoker    Packs/day: 1.00    Types: Cigarettes  . Smokeless tobacco: Never Used  . Alcohol use 25.2 oz/week    42 Cans of beer per week     Comment: 4-5 cans of beer a day     Colonoscopy:  PAP:  Bone density:  Lipid panel:  No Known Allergies  Current Outpatient Prescriptions  Medication Sig Dispense Refill  . amLODipine (NORVASC) 10 MG tablet Take 10 mg by mouth daily.     . colchicine 0.6 MG tablet Take 0.6 mg by mouth every other day.     Marland Kitchen  gabapentin (NEURONTIN) 100 MG capsule Take 100 mg by mouth 3 (three) times daily.     Marland Kitchen LEVEMIR FLEXTOUCH 100 UNIT/ML Pen Inject 48 Units into the skin daily at 10 pm.     . lovastatin (MEVACOR) 20 MG tablet Take 20 mg by mouth daily.     . Nebivolol HCl 20 MG TABS Take 20 mg by mouth daily.    Marland Kitchen NOVOLOG 100 UNIT/ML injection Inject 12 Units into the skin daily at 12 noon. At noon.    . pazopanib (VOTRIENT) 200 MG tablet Take 3 tablets (600 mg total) by mouth daily. Take on an empty stomach. 90 tablet 5  .  tamsulosin (FLOMAX) 0.4 MG CAPS capsule Take 0.4 mg by mouth daily.      No current facility-administered medications for this visit.     OBJECTIVE: Vitals:   03/26/16 1150  BP: (!) 167/86  Pulse: (!) 58  Resp: 18  Temp: (!) 96.1 F (35.6 C)     Body mass index is 25.2 kg/m.    ECOG FS:0 - Asymptomatic  General: Well-developed, well-nourished, no acute distress. Eyes: Pink conjunctiva, anicteric sclera. Lungs: Clear to auscultation bilaterally. Heart: Regular rate and rhythm. No rubs, murmurs, or gallops. Abdomen: Soft, nontender, nondistended. No organomegaly noted, normoactive bowel sounds. Musculoskeletal: No edema, cyanosis, or clubbing. Neuro: Alert, answering all questions appropriately. Cranial nerves grossly intact. Skin: No rashes or petechiae noted. Psych: Normal affect.    LAB RESULTS:  Lab Results  Component Value Date   NA 134 (L) 03/26/2016   K 4.6 03/26/2016   CL 103 03/26/2016   CO2 25 03/26/2016   GLUCOSE 116 (H) 03/26/2016   BUN 16 03/26/2016   CREATININE 1.39 (H) 03/26/2016   CALCIUM 9.1 03/26/2016   PROT 7.7 03/26/2016   ALBUMIN 3.7 03/26/2016   AST 53 (H) 03/26/2016   ALT 29 03/26/2016   ALKPHOS 208 (H) 03/26/2016   BILITOT 3.7 (H) 03/26/2016   GFRNONAA 54 (L) 03/26/2016   GFRAA >60 03/26/2016    Lab Results  Component Value Date   WBC 2.3 (L) 03/26/2016   NEUTROABS 1.3 (L) 03/26/2016   HGB 12.6 (L) 03/26/2016   HCT 35.3 (L) 03/26/2016   MCV 98.3 03/26/2016   PLT 78 (L) 03/26/2016     STUDIES: Nm Pet Image Restag (ps) Skull Base To Thigh  Result Date: 03/21/2016 CLINICAL DATA:  Subsequent treatment strategy for metastatic stage IV renal cell carcinoma with bilateral lung metastases, presenting for restaging on chemotherapy. Left nephrectomy 05/16/2012. EXAM: NUCLEAR MEDICINE PET SKULL BASE TO THIGH TECHNIQUE: 12.8 mCi F-18 FDG was injected intravenously. Full-ring PET imaging was performed from the skull base to thigh after the  radiotracer. CT data was obtained and used for attenuation correction and anatomic localization. FASTING BLOOD GLUCOSE:  Value: 110 mg/dl COMPARISON:  10/20/2015 PET-CT. FINDINGS: NECK No hypermetabolic lymph nodes in the neck. CHEST Left main, left anterior descending, left circumflex and right coronary atherosclerosis. Atherosclerotic nonaneurysmal thoracic aorta. No pneumothorax. No pleural effusions. No enlarged or hypermetabolic axillary lymph nodes. Hypermetabolic 3.8 cm bulky subcarinal lymph node (series 3/image 105) with max SUV 7.2, increased in density and metabolism, previously 4.0 cm with max SUV 2.9. Hypermetabolic 1.7 cm right hilar lymph node (series 3/image 105) with max SUV 6.0, previously 1.4 cm with max SUV 3.6, increased in size and metabolism. Hypermetabolic 2.6 cm left hilar node (series 3/image 103) with max SUV 3.8, previous 2.4 cm using similar measurement technique with max SUV 3.3, mildly  increased in size and metabolism. No new enlarged or hypermetabolic mediastinal or hilar lymph nodes. Dominant hypermetabolic 2.5 x 2.3 cm solid left lower lobe pulmonary nodule (series 3/image 118) with max SUV 4.1, previously 2.2 x 1.8 cm with max SUV 2.5, increased in size and metabolism. Numerous additional scattered small pulmonary nodules throughout both lungs have mildly increased in size and are not associated with significant metabolism. For example a 1.5 cm right upper lobe pulmonary nodule (series 3/image 95), previously 1.2 cm, mildly increased. A 0.7 cm right lower lobe nodule (series 3/image 124) is increased from 0.4 cm. No acute consolidative airspace disease. ABDOMEN/PELVIS No abnormal hypermetabolic activity within the liver, pancreas, adrenal glands, or spleen. No hypermetabolic lymph nodes in the abdomen or pelvis. Cirrhosis. No discrete liver mass. Cholelithiasis. Stable splenomegaly. Stable small volume perihepatic ascites. Atherosclerotic nonaneurysmal abdominal aorta. Status post  left nephrectomy, with no mass, hypermetabolism or fluid collection in the left nephrectomy bed . Mild sigmoid diverticulosis. Small supraumbilical midline ventral abdominal hernia containing fat. Bilateral hydroceles, moderate on the left and small on the right. SKELETON No focal hypermetabolic activity to suggest skeletal metastasis. IMPRESSION: 1. Metabolic progression of nodal metastatic disease in the subcarinal and bilateral hilar lymph node chains. 2. Metabolic and size progression of pulmonary metastases. 3. No new sites of hypermetabolic metastatic disease. 4. Additional findings include aortic atherosclerosis, left main and 3 vessel coronary atherosclerosis, cirrhosis, splenomegaly, small volume ascites, cholelithiasis, left nephrectomy, mild sigmoid diverticulosis and bilateral hydroceles. Electronically Signed   By: Ilona Sorrel M.D.   On: 03/21/2016 11:04   Oncology history: Patient underwent left nephrectomy on May 16, 2012 which revealed a clear cell grade 2 renal cell carcinoma, stage TIIIa, N0, M0. Tumor size of 16 cm. Patient was noted to have renal vein involvement, but no other structures were involved. 0 of 2 lymph nodes were negative for disease. PET scan on October 20, 2015 revealed metastatic disease and patient was initiated on Votrient. This was subsequently discontinued in March 2018 secondary to progression of disease. Patient initiated second line treatment with nivolumab on Monday, April 02, 2016  ASSESSMENT: Stage IV left renal cell carcinoma with metastasis to the lungs  PLAN:    1. Stage IV left renal cell carcinoma with metastasis to the lungs:  PET scan results from March 21, 2016 reviewed independently and reported as above with progression of disease in hilar lymph nodes as well as pulmonary metastasis. Will discontinue Votrient at this time and patient will initiate second line treatment with nivolumab on Monday, April 02, 2016. Patient will receive a flat dose of 240 mg  every 2 weeks until intolerable side effects or progression of disease. Plan to reimage in 3-4 months. 2. Pancytopenia: Secondary to Votrient. Discontinue treatment as above. 3. Renal insufficiency: Patient's creatinine is approximately his baseline.  4. Peripheral neuropathy: Likely secondary to diabetes, currently taking gabapentin. Monitor.  Approximately 30 minutes was spent in discussion of which greater than 50% was consultation.  Patient expressed understanding and was in agreement with this plan. He also understands that He can call clinic at any time with any questions, concerns, or complaints.   Cancer Staging Metastatic renal cell carcinoma to lung [facility]) Staging form: Kidney, AJCC 7th Edition - Clinical stage from 10/12/2015: Stage IV (TX, N0, M1) - Signed by Lloyd Huger, MD on 10/12/2015   Lloyd Huger, MD 04/01/16 4:11 PM

## 2016-03-26 ENCOUNTER — Inpatient Hospital Stay (HOSPITAL_BASED_OUTPATIENT_CLINIC_OR_DEPARTMENT_OTHER): Payer: Medicare HMO | Admitting: Oncology

## 2016-03-26 ENCOUNTER — Inpatient Hospital Stay: Payer: Medicare HMO

## 2016-03-26 VITALS — BP 167/86 | HR 58 | Temp 96.1°F | Resp 18 | Wt 180.7 lb

## 2016-03-26 DIAGNOSIS — E1122 Type 2 diabetes mellitus with diabetic chronic kidney disease: Secondary | ICD-10-CM | POA: Diagnosis not present

## 2016-03-26 DIAGNOSIS — K219 Gastro-esophageal reflux disease without esophagitis: Secondary | ICD-10-CM | POA: Diagnosis not present

## 2016-03-26 DIAGNOSIS — Z79899 Other long term (current) drug therapy: Secondary | ICD-10-CM

## 2016-03-26 DIAGNOSIS — C649 Malignant neoplasm of unspecified kidney, except renal pelvis: Secondary | ICD-10-CM

## 2016-03-26 DIAGNOSIS — C7802 Secondary malignant neoplasm of left lung: Principal | ICD-10-CM

## 2016-03-26 DIAGNOSIS — C642 Malignant neoplasm of left kidney, except renal pelvis: Secondary | ICD-10-CM

## 2016-03-26 DIAGNOSIS — G629 Polyneuropathy, unspecified: Secondary | ICD-10-CM

## 2016-03-26 DIAGNOSIS — N183 Chronic kidney disease, stage 3 (moderate): Secondary | ICD-10-CM | POA: Diagnosis not present

## 2016-03-26 DIAGNOSIS — D61818 Other pancytopenia: Secondary | ICD-10-CM

## 2016-03-26 DIAGNOSIS — K5730 Diverticulosis of large intestine without perforation or abscess without bleeding: Secondary | ICD-10-CM | POA: Diagnosis not present

## 2016-03-26 DIAGNOSIS — I129 Hypertensive chronic kidney disease with stage 1 through stage 4 chronic kidney disease, or unspecified chronic kidney disease: Secondary | ICD-10-CM | POA: Diagnosis not present

## 2016-03-26 DIAGNOSIS — I700 Atherosclerosis of aorta: Secondary | ICD-10-CM | POA: Diagnosis not present

## 2016-03-26 LAB — COMPREHENSIVE METABOLIC PANEL
ALK PHOS: 208 U/L — AB (ref 38–126)
ALT: 29 U/L (ref 17–63)
AST: 53 U/L — AB (ref 15–41)
Albumin: 3.7 g/dL (ref 3.5–5.0)
Anion gap: 6 (ref 5–15)
BILIRUBIN TOTAL: 3.7 mg/dL — AB (ref 0.3–1.2)
BUN: 16 mg/dL (ref 6–20)
CHLORIDE: 103 mmol/L (ref 101–111)
CO2: 25 mmol/L (ref 22–32)
CREATININE: 1.39 mg/dL — AB (ref 0.61–1.24)
Calcium: 9.1 mg/dL (ref 8.9–10.3)
GFR calc Af Amer: >60 mL/min (ref >60)
GFR, EST NON AFRICAN AMERICAN: 54 mL/min — AB (ref >60)
Glucose, Bld: 116 mg/dL — ABNORMAL HIGH (ref 65–99)
Potassium: 4.6 mmol/L (ref 3.5–5.1)
Sodium: 134 mmol/L — ABNORMAL LOW (ref 135–145)
Total Protein: 7.7 g/dL (ref 6.5–8.1)

## 2016-03-26 LAB — CBC WITH DIFFERENTIAL/PLATELET
BASOS ABS: 0 10*3/uL (ref 0–0.1)
BASOS PCT: 1 %
EOS ABS: 0.2 10*3/uL (ref 0–0.7)
EOS PCT: 7 %
HCT: 35.3 % — ABNORMAL LOW (ref 40.0–52.0)
Hemoglobin: 12.6 g/dL — ABNORMAL LOW (ref 13.0–18.0)
Lymphocytes Relative: 26 %
Lymphs Abs: 0.6 10*3/uL — ABNORMAL LOW (ref 1.0–3.6)
MCH: 35.1 pg — ABNORMAL HIGH (ref 26.0–34.0)
MCHC: 35.7 g/dL (ref 32.0–36.0)
MCV: 98.3 fL (ref 80.0–100.0)
Monocytes Absolute: 0.3 10*3/uL (ref 0.2–1.0)
Monocytes Relative: 13 %
Neutro Abs: 1.3 10*3/uL — ABNORMAL LOW (ref 1.4–6.5)
Neutrophils Relative %: 53 %
PLATELETS: 78 10*3/uL — AB (ref 150–440)
RBC: 3.59 MIL/uL — AB (ref 4.40–5.90)
RDW: 18 % — ABNORMAL HIGH (ref 11.5–14.5)
WBC: 2.3 10*3/uL — AB (ref 3.8–10.6)

## 2016-03-26 NOTE — Progress Notes (Signed)
START ON PATHWAY REGIMEN - Renal Cell     A cycle is every 14 days:     Nivolumab   **Always confirm dose/schedule in your pharmacy ordering system**    Patient Characteristics: Metastatic, Clear Cell, Second Line, Prior Anti-Angiogenic Treatment AJCC M Category: M1 AJCC 8 Stage Grouping: IV Current evidence of distant metastases? Yes AJCC T Category: TX AJCC N Category: NX Does patient have oligometastatic disease? No Would you be surprised if this patient died  in the next year? I would be surprised if this patient died in the next year Histology: Clear Cell Line of therapy: Second Line  Intent of Therapy: Non-Curative / Palliative Intent, Discussed with Patient

## 2016-03-26 NOTE — Progress Notes (Signed)
Verbalizes understanding to take 3 tablets votrient ('600mg'$ ) daily. States has not missed any days. Had 2 week break from medication in December but not breaks in the past few months.

## 2016-03-27 DIAGNOSIS — Z794 Long term (current) use of insulin: Secondary | ICD-10-CM | POA: Diagnosis not present

## 2016-03-27 DIAGNOSIS — Z6825 Body mass index (BMI) 25.0-25.9, adult: Secondary | ICD-10-CM | POA: Diagnosis not present

## 2016-03-27 DIAGNOSIS — E113391 Type 2 diabetes mellitus with moderate nonproliferative diabetic retinopathy without macular edema, right eye: Secondary | ICD-10-CM | POA: Diagnosis not present

## 2016-03-27 DIAGNOSIS — I10 Essential (primary) hypertension: Secondary | ICD-10-CM | POA: Diagnosis not present

## 2016-03-27 DIAGNOSIS — E119 Type 2 diabetes mellitus without complications: Secondary | ICD-10-CM | POA: Diagnosis not present

## 2016-04-01 NOTE — Progress Notes (Signed)
[place]  Telephone:(336) [phone] [phone]) [phone]  ID: Diamantina Providence OB: Apr 24, 1955  MR#: [id number]  GGY#:[id number]  Patient Care Team: [facility] as PCP - General  CHIEF COMPLAINT: Stage IV renal cell carcinoma with bilateral lung metastases.  INTERVAL HISTORY: Patient returns to clinic today for further evaluation and initiation of second line treatment with nivolumab. He has chronic peripheral neuropathy in both feet, currently taking gabapentin, and has no other neurologic complaints. He denies any fevers, chills, or recent illnesses. He has a good appetite and denies weight loss. He denies any chest pain, shortness of breath, cough, or hemoptysis. He has no nausea, vomiting, constipation, or diarrhea. He has no melena or hematochezia. He has no urinary complaints. Patient offers no specific complaints today.   REVIEW OF SYSTEMS:   Review of Systems  Constitutional: Negative.  Negative for fever, malaise/fatigue and weight loss.  Eyes: Negative for blurred vision.  Respiratory: Negative for cough and shortness of breath.   Cardiovascular: Negative.  Negative for chest pain and leg swelling.  Gastrointestinal: Negative.  Negative for abdominal pain, constipation, diarrhea, nausea and vomiting.  Genitourinary: Negative.   Musculoskeletal: Negative.   Neurological: Positive for tingling and sensory change. Negative for weakness and headaches.  Psychiatric/Behavioral: Negative.  The patient is not nervous/anxious and does not have insomnia.    As per HPI. Otherwise, a complete review of systems is negative.   PAST MEDICAL HISTORY: Past Medical History:  Diagnosis Date  . Anemia   . BPH (benign prostatic hyperplasia)   . CKD (chronic kidney disease) stage 3, GFR 30-59 ml/min   . Clear cell renal cell carcinoma (HCC)   . Diabetes mellitus without complication ([place])   . Dyspnea    with exertion  . GERD (gastroesophageal reflux disease)   .  History of gout   . History of nephrectomy    Left  . Hypertension   . Neuropathy ([place])   . Renal cell carcinoma of left kidney (HCC)    mets to lungs  . Umbilical hernia     PAST SURGICAL HISTORY: Past Surgical History:  Procedure Laterality Date  . BACK SURGERY    . ENDOBRONCHIAL ULTRASOUND N/A 10/14/2015   Procedure: ENDOBRONCHIAL ULTRASOUND;  Surgeon: [name], MD;  Location: [facility] ORS;  Service: Pulmonary;  Laterality: N/A;  . EYE SURGERY Bilateral    Cataract Extraction with IOL  . KNEE SURGERY Right   . NEPHRECTOMY      FAMILY HISTORY: Family History  Problem Relation Age of Onset  . Ovarian cancer Mother   . Diabetes Father   . Hypertension Father     ADVANCED DIRECTIVES (Y/N):  N  HEALTH MAINTENANCE: Social History  Substance Use Topics  . Smoking status: Former Smoker    Packs/day: 1.00    Types: Cigarettes  . Smokeless tobacco: Never Used  . Alcohol use 25.2 oz/week    42 Cans of beer per week     Comment: 4-5 cans of beer a day     Colonoscopy:  PAP:  Bone density:  Lipid panel:  No Known Allergies  Current Outpatient Prescriptions  Medication Sig Dispense Refill  . amLODipine (NORVASC) 10 MG tablet Take 10 mg by mouth daily.     . colchicine 0.6 MG tablet Take 0.6 mg by mouth every other day.     . gabapentin (NEURONTIN) 100 MG capsule Take 100 mg by mouth 3 (three) times daily.     Marland Kitchen LEVEMIR FLEXTOUCH 100 UNIT/ML  Pen Inject 30 Units into the skin daily at 10 pm.     . losartan (COZAAR) 25 MG tablet Take 25 mg by mouth daily.    Marland Kitchen lovastatin (MEVACOR) 20 MG tablet Take 20 mg by mouth daily.     . Nebivolol HCl 20 MG TABS Take 20 mg by mouth daily.    . tamsulosin (FLOMAX) 0.4 MG CAPS capsule Take 0.4 mg by mouth daily.      No current facility-administered medications for this visit.     OBJECTIVE: Vitals:   04/02/16 1354  BP: (!) 143/84  Pulse: 60  Resp: 18  Temp: 97.4 F (36.3 C)     Body mass index is 25.87 kg/m.     ECOG FS:0 - Asymptomatic  General: Well-developed, well-nourished, no acute distress. Eyes: Pink conjunctiva, anicteric sclera. Lungs: Clear to auscultation bilaterally. Heart: Regular rate and rhythm. No rubs, murmurs, or gallops. Abdomen: Soft, nontender, nondistended. No organomegaly noted, normoactive bowel sounds. Musculoskeletal: No edema, cyanosis, or clubbing. Neuro: Alert, answering all questions appropriately. Cranial nerves grossly intact. Skin: No rashes or petechiae noted. Psych: Normal affect.    LAB RESULTS:  Lab Results  Component Value Date   NA 132 (L) 04/02/2016   K 5.1 04/02/2016   CL 103 04/02/2016   CO2 23 04/02/2016   GLUCOSE 236 (H) 04/02/2016   BUN 16 04/02/2016   CREATININE 1.44 (H) 04/02/2016   CALCIUM 8.4 (L) 04/02/2016   PROT 7.1 04/02/2016   ALBUMIN 3.5 04/02/2016   AST 54 (H) 04/02/2016   ALT 27 04/02/2016   ALKPHOS 217 (H) 04/02/2016   BILITOT 3.3 (H) 04/02/2016   GFRNONAA 52 (L) 04/02/2016   GFRAA 60 (L) 04/02/2016    Lab Results  Component Value Date   WBC 2.2 (L) 04/02/2016   NEUTROABS 1.3 (L) 04/02/2016   HGB 12.2 (L) 04/02/2016   HCT 34.2 (L) 04/02/2016   MCV 100.1 (H) 04/02/2016   PLT 75 (L) 04/02/2016     STUDIES: Nm Pet Image Restag (ps) Skull Base To Thigh  Result Date: 03/21/2016 CLINICAL DATA:  Subsequent treatment strategy for metastatic stage IV renal cell carcinoma with bilateral lung metastases, presenting for restaging on chemotherapy. Left nephrectomy 05/16/2012. EXAM: NUCLEAR MEDICINE PET SKULL BASE TO THIGH TECHNIQUE: 12.8 mCi F-18 FDG was injected intravenously. Full-ring PET imaging was performed from the skull base to thigh after the radiotracer. CT data was obtained and used for attenuation correction and anatomic localization. FASTING BLOOD GLUCOSE:  Value: 110 mg/dl COMPARISON:  10/20/2015 PET-CT. FINDINGS: NECK No hypermetabolic lymph nodes in the neck. CHEST Left main, left anterior descending, left  circumflex and right coronary atherosclerosis. Atherosclerotic nonaneurysmal thoracic aorta. No pneumothorax. No pleural effusions. No enlarged or hypermetabolic axillary lymph nodes. Hypermetabolic 3.8 cm bulky subcarinal lymph node (series 3/image 105) with max SUV 7.2, increased in density and metabolism, previously 4.0 cm with max SUV 2.9. Hypermetabolic 1.7 cm right hilar lymph node (series 3/image 105) with max SUV 6.0, previously 1.4 cm with max SUV 3.6, increased in size and metabolism. Hypermetabolic 2.6 cm left hilar node (series 3/image 103) with max SUV 3.8, previous 2.4 cm using similar measurement technique with max SUV 3.3, mildly increased in size and metabolism. No new enlarged or hypermetabolic mediastinal or hilar lymph nodes. Dominant hypermetabolic 2.5 x 2.3 cm solid left lower lobe pulmonary nodule (series 3/image 118) with max SUV 4.1, previously 2.2 x 1.8 cm with max SUV 2.5, increased in size and metabolism. Numerous additional scattered  small pulmonary nodules throughout both lungs have mildly increased in size and are not associated with significant metabolism. For example a 1.5 cm right upper lobe pulmonary nodule (series 3/image 95), previously 1.2 cm, mildly increased. A 0.7 cm right lower lobe nodule (series 3/image 124) is increased from 0.4 cm. No acute consolidative airspace disease. ABDOMEN/PELVIS No abnormal hypermetabolic activity within the liver, pancreas, adrenal glands, or spleen. No hypermetabolic lymph nodes in the abdomen or pelvis. Cirrhosis. No discrete liver mass. Cholelithiasis. Stable splenomegaly. Stable small volume perihepatic ascites. Atherosclerotic nonaneurysmal abdominal aorta. Status post left nephrectomy, with no mass, hypermetabolism or fluid collection in the left nephrectomy bed . Mild sigmoid diverticulosis. Small supraumbilical midline ventral abdominal hernia containing fat. Bilateral hydroceles, moderate on the left and small on the right. SKELETON No  focal hypermetabolic activity to suggest skeletal metastasis. IMPRESSION: 1. Metabolic progression of nodal metastatic disease in the subcarinal and bilateral hilar lymph node chains. 2. Metabolic and size progression of pulmonary metastases. 3. No new sites of hypermetabolic metastatic disease. 4. Additional findings include aortic atherosclerosis, left main and 3 vessel coronary atherosclerosis, cirrhosis, splenomegaly, small volume ascites, cholelithiasis, left nephrectomy, mild sigmoid diverticulosis and bilateral hydroceles. Electronically Signed   By: Ilona Sorrel M.D.   On: 03/21/2016 11:04   Oncology history: Patient underwent left nephrectomy on May 16, 2012 which revealed a clear cell grade 2 renal cell carcinoma, stage TIIIa, N0, M0. Tumor size of 16 cm. Patient was noted to have renal vein involvement, but no other structures were involved. 0 of 2 lymph nodes were negative for disease. PET scan on October 20, 2015 revealed metastatic disease and patient was initiated on Votrient. This was subsequently discontinued in March 2018 secondary to progression of disease. Patient initiated second line treatment with nivolumab on Monday, April 02, 2016  ASSESSMENT: Stage IV left renal cell carcinoma with metastasis to the lungs  PLAN:    1. Stage IV left renal cell carcinoma with metastasis to the lungs:  PET scan results from March 21, 2016 reviewed independently and reported as above with progression of disease in hilar lymph nodes as well as pulmonary metastasis. Votrient has been discontinued and patient will proceed with cycle 1 of nivolumab today. Patient will receive a flat dose of 240 mg every 2 weeks until intolerable side effects or progression of disease. Plan to reimage in 3-4 months. Return to clinic in 2 weeks for consideration of cycle 2. 2. Pancytopenia: Secondary to Votrient. Discontinue treatment as above. 3. Renal insufficiency: Patient's creatinine is approximately his baseline.  4.  Peripheral neuropathy: Likely secondary to diabetes, currently taking gabapentin. Monitor.  Approximately 30 minutes was spent in discussion of which greater than 50% was consultation.  Patient expressed understanding and was in agreement with this plan. He also understands that He can call clinic at any time with any questions, concerns, or complaints.   Cancer Staging Metastatic renal cell carcinoma to lung [facility]) Staging form: Kidney, AJCC 7th Edition - Clinical stage from 10/12/2015: Stage IV (TX, N0, M1) - Signed by Lloyd Huger, MD on 10/12/2015   Lloyd Huger, MD 04/07/16 9:49 AM

## 2016-04-02 ENCOUNTER — Inpatient Hospital Stay: Payer: Medicare HMO | Attending: Oncology

## 2016-04-02 ENCOUNTER — Inpatient Hospital Stay (HOSPITAL_BASED_OUTPATIENT_CLINIC_OR_DEPARTMENT_OTHER): Payer: Medicare HMO | Admitting: Oncology

## 2016-04-02 ENCOUNTER — Inpatient Hospital Stay: Payer: Medicare HMO

## 2016-04-02 VITALS — BP 143/84 | HR 60 | Temp 97.4°F | Resp 18 | Wt 185.5 lb

## 2016-04-02 DIAGNOSIS — Z794 Long term (current) use of insulin: Secondary | ICD-10-CM | POA: Insufficient documentation

## 2016-04-02 DIAGNOSIS — G629 Polyneuropathy, unspecified: Secondary | ICD-10-CM | POA: Diagnosis not present

## 2016-04-02 DIAGNOSIS — Z79899 Other long term (current) drug therapy: Secondary | ICD-10-CM | POA: Diagnosis not present

## 2016-04-02 DIAGNOSIS — Z905 Acquired absence of kidney: Secondary | ICD-10-CM | POA: Diagnosis not present

## 2016-04-02 DIAGNOSIS — D61818 Other pancytopenia: Secondary | ICD-10-CM | POA: Diagnosis not present

## 2016-04-02 DIAGNOSIS — C642 Malignant neoplasm of left kidney, except renal pelvis: Secondary | ICD-10-CM

## 2016-04-02 DIAGNOSIS — N400 Enlarged prostate without lower urinary tract symptoms: Secondary | ICD-10-CM | POA: Insufficient documentation

## 2016-04-02 DIAGNOSIS — C7801 Secondary malignant neoplasm of right lung: Secondary | ICD-10-CM

## 2016-04-02 DIAGNOSIS — Z87891 Personal history of nicotine dependence: Secondary | ICD-10-CM | POA: Diagnosis not present

## 2016-04-02 DIAGNOSIS — C649 Malignant neoplasm of unspecified kidney, except renal pelvis: Secondary | ICD-10-CM

## 2016-04-02 DIAGNOSIS — E1122 Type 2 diabetes mellitus with diabetic chronic kidney disease: Secondary | ICD-10-CM | POA: Diagnosis not present

## 2016-04-02 DIAGNOSIS — K219 Gastro-esophageal reflux disease without esophagitis: Secondary | ICD-10-CM | POA: Diagnosis not present

## 2016-04-02 DIAGNOSIS — M109 Gout, unspecified: Secondary | ICD-10-CM | POA: Diagnosis not present

## 2016-04-02 DIAGNOSIS — I129 Hypertensive chronic kidney disease with stage 1 through stage 4 chronic kidney disease, or unspecified chronic kidney disease: Secondary | ICD-10-CM | POA: Insufficient documentation

## 2016-04-02 DIAGNOSIS — C7802 Secondary malignant neoplasm of left lung: Principal | ICD-10-CM

## 2016-04-02 DIAGNOSIS — I700 Atherosclerosis of aorta: Secondary | ICD-10-CM | POA: Diagnosis not present

## 2016-04-02 DIAGNOSIS — N183 Chronic kidney disease, stage 3 (moderate): Secondary | ICD-10-CM | POA: Diagnosis not present

## 2016-04-02 DIAGNOSIS — Z5111 Encounter for antineoplastic chemotherapy: Secondary | ICD-10-CM | POA: Insufficient documentation

## 2016-04-02 DIAGNOSIS — K5730 Diverticulosis of large intestine without perforation or abscess without bleeding: Secondary | ICD-10-CM | POA: Diagnosis not present

## 2016-04-02 LAB — COMPREHENSIVE METABOLIC PANEL
ALBUMIN: 3.5 g/dL (ref 3.5–5.0)
ALT: 27 U/L (ref 17–63)
AST: 54 U/L — AB (ref 15–41)
Alkaline Phosphatase: 217 U/L — ABNORMAL HIGH (ref 38–126)
Anion gap: 6 (ref 5–15)
BUN: 16 mg/dL (ref 6–20)
CHLORIDE: 103 mmol/L (ref 101–111)
CO2: 23 mmol/L (ref 22–32)
CREATININE: 1.44 mg/dL — AB (ref 0.61–1.24)
Calcium: 8.4 mg/dL — ABNORMAL LOW (ref 8.9–10.3)
GFR calc non Af Amer: 52 mL/min — ABNORMAL LOW (ref >60)
GFR, EST AFRICAN AMERICAN: 60 mL/min — AB (ref >60)
GLUCOSE: 236 mg/dL — AB (ref 65–99)
Potassium: 5.1 mmol/L (ref 3.5–5.1)
Sodium: 132 mmol/L — ABNORMAL LOW (ref 135–145)
Total Bilirubin: 3.3 mg/dL — ABNORMAL HIGH (ref 0.3–1.2)
Total Protein: 7.1 g/dL (ref 6.5–8.1)

## 2016-04-02 LAB — CBC WITH DIFFERENTIAL/PLATELET
BASOS ABS: 0 10*3/uL (ref 0–0.1)
BASOS PCT: 1 %
EOS ABS: 0.1 10*3/uL (ref 0–0.7)
EOS PCT: 6 %
HCT: 34.2 % — ABNORMAL LOW (ref 40.0–52.0)
Hemoglobin: 12.2 g/dL — ABNORMAL LOW (ref 13.0–18.0)
Lymphocytes Relative: 23 %
Lymphs Abs: 0.5 10*3/uL — ABNORMAL LOW (ref 1.0–3.6)
MCH: 35.6 pg — AB (ref 26.0–34.0)
MCHC: 35.6 g/dL (ref 32.0–36.0)
MCV: 100.1 fL — ABNORMAL HIGH (ref 80.0–100.0)
Monocytes Absolute: 0.3 10*3/uL (ref 0.2–1.0)
Monocytes Relative: 12 %
Neutro Abs: 1.3 10*3/uL — ABNORMAL LOW (ref 1.4–6.5)
Neutrophils Relative %: 58 %
PLATELETS: 75 10*3/uL — AB (ref 150–440)
RBC: 3.42 MIL/uL — AB (ref 4.40–5.90)
RDW: 17 % — ABNORMAL HIGH (ref 11.5–14.5)
WBC: 2.2 10*3/uL — AB (ref 3.8–10.6)

## 2016-04-02 MED ORDER — SODIUM CHLORIDE 0.9 % IV SOLN
Freq: Once | INTRAVENOUS | Status: AC
  Administered 2016-04-02: 15:00:00 via INTRAVENOUS
  Filled 2016-04-02: qty 1000

## 2016-04-02 MED ORDER — SODIUM CHLORIDE 0.9 % IV SOLN
240.0000 mg | Freq: Once | INTRAVENOUS | Status: AC
  Administered 2016-04-02: 240 mg via INTRAVENOUS
  Filled 2016-04-02: qty 24

## 2016-04-02 NOTE — Progress Notes (Signed)
Offers no complaints. States is feeling well. 

## 2016-04-03 LAB — THYROID PANEL WITH TSH
Free Thyroxine Index: 1.8 (ref 1.2–4.9)
T3 Uptake Ratio: 26 % (ref 24–39)
T4 TOTAL: 7.1 ug/dL (ref 4.5–12.0)
TSH: 7.53 u[IU]/mL — AB (ref 0.450–4.500)

## 2016-04-16 ENCOUNTER — Inpatient Hospital Stay: Payer: Medicare HMO

## 2016-04-16 ENCOUNTER — Inpatient Hospital Stay (HOSPITAL_BASED_OUTPATIENT_CLINIC_OR_DEPARTMENT_OTHER): Payer: Medicare HMO | Admitting: Oncology

## 2016-04-16 VITALS — BP 144/87 | HR 56 | Temp 96.1°F | Resp 18 | Wt 193.5 lb

## 2016-04-16 DIAGNOSIS — Z79899 Other long term (current) drug therapy: Secondary | ICD-10-CM

## 2016-04-16 DIAGNOSIS — C642 Malignant neoplasm of left kidney, except renal pelvis: Secondary | ICD-10-CM | POA: Diagnosis not present

## 2016-04-16 DIAGNOSIS — C7802 Secondary malignant neoplasm of left lung: Secondary | ICD-10-CM | POA: Diagnosis not present

## 2016-04-16 DIAGNOSIS — D61818 Other pancytopenia: Secondary | ICD-10-CM | POA: Diagnosis not present

## 2016-04-16 DIAGNOSIS — Z5111 Encounter for antineoplastic chemotherapy: Secondary | ICD-10-CM | POA: Diagnosis not present

## 2016-04-16 DIAGNOSIS — G629 Polyneuropathy, unspecified: Secondary | ICD-10-CM

## 2016-04-16 DIAGNOSIS — C649 Malignant neoplasm of unspecified kidney, except renal pelvis: Secondary | ICD-10-CM

## 2016-04-16 LAB — CBC WITH DIFFERENTIAL/PLATELET
BASOS PCT: 1 %
Basophils Absolute: 0 10*3/uL (ref 0–0.1)
Eosinophils Absolute: 0.1 10*3/uL (ref 0–0.7)
Eosinophils Relative: 6 %
HEMATOCRIT: 34.2 % — AB (ref 40.0–52.0)
HEMOGLOBIN: 12.2 g/dL — AB (ref 13.0–18.0)
LYMPHS ABS: 0.5 10*3/uL — AB (ref 1.0–3.6)
Lymphocytes Relative: 23 %
MCH: 36.4 pg — ABNORMAL HIGH (ref 26.0–34.0)
MCHC: 35.7 g/dL (ref 32.0–36.0)
MCV: 101.9 fL — ABNORMAL HIGH (ref 80.0–100.0)
MONOS PCT: 13 %
Monocytes Absolute: 0.3 10*3/uL (ref 0.2–1.0)
NEUTROS ABS: 1.3 10*3/uL — AB (ref 1.4–6.5)
NEUTROS PCT: 57 %
Platelets: 77 10*3/uL — ABNORMAL LOW (ref 150–440)
RBC: 3.36 MIL/uL — AB (ref 4.40–5.90)
RDW: 14.9 % — ABNORMAL HIGH (ref 11.5–14.5)
WBC: 2.3 10*3/uL — AB (ref 3.8–10.6)

## 2016-04-16 LAB — COMPREHENSIVE METABOLIC PANEL
ALK PHOS: 214 U/L — AB (ref 38–126)
ALT: 20 U/L (ref 17–63)
ANION GAP: 8 (ref 5–15)
AST: 42 U/L — ABNORMAL HIGH (ref 15–41)
Albumin: 3.5 g/dL (ref 3.5–5.0)
BILIRUBIN TOTAL: 2.6 mg/dL — AB (ref 0.3–1.2)
BUN: 18 mg/dL (ref 6–20)
CALCIUM: 8.5 mg/dL — AB (ref 8.9–10.3)
CO2: 23 mmol/L (ref 22–32)
CREATININE: 1.39 mg/dL — AB (ref 0.61–1.24)
Chloride: 100 mmol/L — ABNORMAL LOW (ref 101–111)
GFR calc Af Amer: >60 mL/min (ref >60)
GFR calc non Af Amer: 54 mL/min — ABNORMAL LOW (ref >60)
GLUCOSE: 219 mg/dL — AB (ref 65–99)
Potassium: 4.9 mmol/L (ref 3.5–5.1)
Sodium: 131 mmol/L — ABNORMAL LOW (ref 135–145)
TOTAL PROTEIN: 7 g/dL (ref 6.5–8.1)

## 2016-04-16 MED ORDER — SODIUM CHLORIDE 0.9 % IV SOLN: 240.0000 mg | Freq: Once | INTRAVENOUS | Status: DC

## 2016-04-16 MED ORDER — SODIUM CHLORIDE 0.9 % IV SOLN
Freq: Once | INTRAVENOUS | Status: AC
  Administered 2016-04-16: 15:00:00 via INTRAVENOUS
  Filled 2016-04-16: qty 1000

## 2016-04-16 MED ORDER — SODIUM CHLORIDE 0.9 % IV SOLN
240.0000 mg | Freq: Once | INTRAVENOUS | Status: AC
  Administered 2016-04-16: 240 mg via INTRAVENOUS
  Filled 2016-04-16: qty 24

## 2016-04-16 NOTE — Progress Notes (Signed)
Offers no complaints  

## 2016-04-16 NOTE — Progress Notes (Signed)
[place]  Telephone:(336) [phone] [phone]) [phone]  ID: Rodney Stewart OB: 04-17-1955  MR#: [id number]  URK#:[id number]  Patient Care Team: [facility] as PCP - General  CHIEF COMPLAINT: Stage IV renal cell carcinoma with bilateral lung metastases.  INTERVAL HISTORY: Patient returns to clinic today for further evaluation and consideration of cycle 2 of nivolumab. He tolerated his first infusion well without significant side effects. He continues to have chronic peripheral neuropathy in both feet, currently taking gabapentin, and has no other neurologic complaints. He denies any fevers, chills, or recent illnesses. He has a good appetite and denies weight loss. He denies any chest pain, shortness of breath, cough, or hemoptysis. He has no nausea, vomiting, constipation, or diarrhea. He has no melena or hematochezia. He has no urinary complaints. Patient offers no further specific complaints today.   REVIEW OF SYSTEMS:   Review of Systems  Constitutional: Negative.  Negative for fever, malaise/fatigue and weight loss.  Eyes: Negative for blurred vision.  Respiratory: Negative for cough and shortness of breath.   Cardiovascular: Negative.  Negative for chest pain and leg swelling.  Gastrointestinal: Negative.  Negative for abdominal pain, constipation, diarrhea, nausea and vomiting.  Genitourinary: Negative.   Musculoskeletal: Negative.   Neurological: Positive for tingling and sensory change. Negative for weakness and headaches.  Psychiatric/Behavioral: Negative.  The patient is not nervous/anxious and does not have insomnia.    As per HPI. Otherwise, a complete review of systems is negative.   PAST MEDICAL HISTORY: Past Medical History:  Diagnosis Date  . Anemia   . BPH (benign prostatic hyperplasia)   . CKD (chronic kidney disease) stage 3, GFR 30-59 ml/min   . Clear cell renal cell carcinoma (HCC)   . Diabetes mellitus without complication ([place])     . Dyspnea    with exertion  . GERD (gastroesophageal reflux disease)   . History of gout   . History of nephrectomy    Left  . Hypertension   . Neuropathy ([place])   . Renal cell carcinoma of left kidney (HCC)    mets to lungs  . Umbilical hernia     PAST SURGICAL HISTORY: Past Surgical History:  Procedure Laterality Date  . BACK SURGERY    . ENDOBRONCHIAL ULTRASOUND N/A 10/14/2015   Procedure: ENDOBRONCHIAL ULTRASOUND;  Surgeon: [name], MD;  Location: [facility] ORS;  Service: Pulmonary;  Laterality: N/A;  . EYE SURGERY Bilateral    Cataract Extraction with IOL  . KNEE SURGERY Right   . NEPHRECTOMY      FAMILY HISTORY: Family History  Problem Relation Age of Onset  . Ovarian cancer Mother   . Diabetes Father   . Hypertension Father     ADVANCED DIRECTIVES (Y/N):  N  HEALTH MAINTENANCE: Social History  Substance Use Topics  . Smoking status: Former Smoker    Packs/day: 1.00    Types: Cigarettes  . Smokeless tobacco: Never Used  . Alcohol use 25.2 oz/week    42 Cans of beer per week     Comment: 4-5 cans of beer a day     Colonoscopy:  PAP:  Bone density:  Lipid panel:  No Known Allergies  Current Outpatient Prescriptions  Medication Sig Dispense Refill  . amLODipine (NORVASC) 10 MG tablet Take 10 mg by mouth daily.     . colchicine 0.6 MG tablet Take 0.6 mg by mouth every other day.     . gabapentin (NEURONTIN) 100 MG capsule Take 100 mg by mouth  3 (three) times daily.     Marland Kitchen LEVEMIR FLEXTOUCH 100 UNIT/ML Pen Inject 30 Units into the skin daily at 10 pm.     . losartan (COZAAR) 25 MG tablet Take 25 mg by mouth daily.    Marland Kitchen lovastatin (MEVACOR) 20 MG tablet Take 20 mg by mouth daily.     . Nebivolol HCl 20 MG TABS Take 20 mg by mouth daily.    . tamsulosin (FLOMAX) 0.4 MG CAPS capsule Take 0.4 mg by mouth daily.      No current facility-administered medications for this visit.     OBJECTIVE: Vitals:   04/16/16 1426  BP: (!) 144/87  Pulse:  (!) 56  Resp: 18  Temp: (!) 96.1 F (35.6 C)     Body mass index is 26.99 kg/m.    ECOG FS:0 - Asymptomatic  General: Well-developed, well-nourished, no acute distress. Eyes: Pink conjunctiva, anicteric sclera. Lungs: Clear to auscultation bilaterally. Heart: Regular rate and rhythm. No rubs, murmurs, or gallops. Abdomen: Soft, nontender, nondistended. No organomegaly noted, normoactive bowel sounds. Musculoskeletal: No edema, cyanosis, or clubbing. Neuro: Alert, answering all questions appropriately. Cranial nerves grossly intact. Skin: No rashes or petechiae noted. Psych: Normal affect.    LAB RESULTS:  Lab Results  Component Value Date   NA 131 (L) 04/16/2016   K 4.9 04/16/2016   CL 100 (L) 04/16/2016   CO2 23 04/16/2016   GLUCOSE 219 (H) 04/16/2016   BUN 18 04/16/2016   CREATININE 1.39 (H) 04/16/2016   CALCIUM 8.5 (L) 04/16/2016   PROT 7.0 04/16/2016   ALBUMIN 3.5 04/16/2016   AST 42 (H) 04/16/2016   ALT 20 04/16/2016   ALKPHOS 214 (H) 04/16/2016   BILITOT 2.6 (H) 04/16/2016   GFRNONAA 54 (L) 04/16/2016   GFRAA >60 04/16/2016    Lab Results  Component Value Date   WBC 2.3 (L) 04/16/2016   NEUTROABS 1.3 (L) 04/16/2016   HGB 12.2 (L) 04/16/2016   HCT 34.2 (L) 04/16/2016   MCV 101.9 (H) 04/16/2016   PLT 77 (L) 04/16/2016     STUDIES: No results found.   Oncology history: Patient underwent left nephrectomy on May 16, 2012 which revealed a clear cell grade 2 renal cell carcinoma, stage TIIIa, N0, M0. Tumor size of 16 cm. Patient was noted to have renal vein involvement, but no other structures were involved. 0 of 2 lymph nodes were negative for disease. PET scan on October 20, 2015 revealed metastatic disease and patient was initiated on Votrient. This was subsequently discontinued in March 2018 secondary to progression of disease. Patient initiated second line treatment with nivolumab on Monday, April 02, 2016  ASSESSMENT: Stage IV left renal cell carcinoma  with metastasis to the lungs  PLAN:    1. Stage IV left renal cell carcinoma with metastasis to the lungs:  PET scan results from March 21, 2016 reviewed independently with progression of disease in hilar lymph nodes as well as pulmonary metastasis. Votrient was discontinued and patient will proceed with cycle 2 of nivolumab today. Patient will receive a flat dose of 240 mg every 2 weeks until intolerable side effects or progression of disease. Plan to reimage in 3-4 months. Return to clinic in 2 weeks for consideration of cycle 3. 2. Pancytopenia: Secondary to Votrient. Discontinue treatment as above. 3. Renal insufficiency: Patient's creatinine is approximately his baseline.  4. Peripheral neuropathy: Likely secondary to diabetes, currently taking gabapentin. Monitor. 5. Hyperbilirubinemia: Unclear etiology, but improving. Monitor.   Patient expressed understanding  and was in agreement with this plan. He also understands that He can call clinic at any time with any questions, concerns, or complaints.   Cancer Staging Metastatic renal cell carcinoma to lung [facility]) Staging form: Kidney, AJCC 7th Edition - Clinical stage from 10/12/2015: Stage IV (TX, N0, M1) - Signed by Lloyd Huger, MD on 10/12/2015   Lloyd Huger, MD 04/22/16 8:09 AM

## 2016-04-17 LAB — THYROID PANEL WITH TSH
Free Thyroxine Index: 1.7 (ref 1.2–4.9)
T3 UPTAKE RATIO: 25 % (ref 24–39)
T4 TOTAL: 6.7 ug/dL (ref 4.5–12.0)
TSH: 7.75 u[IU]/mL — AB (ref 0.450–4.500)

## 2016-04-29 NOTE — Progress Notes (Signed)
[place]  [phone]) [phone] [phone]) [phone]  ID: Rodney Stewart OB: 09/22/1955  MR#: [id number]  UYQ#:[id number]  Patient Care Team: [facility] as PCP - General  CHIEF COMPLAINT: Stage IV renal cell carcinoma with bilateral lung metastases.  INTERVAL HISTORY: Patient returns to clinic today for further evaluation and consideration of cycle 3 of nivolumab. He is tolerating his treatments well without significant side effects. He continues to have chronic peripheral neuropathy in both feet, currently taking gabapentin, and has no other neurologic complaints. He denies any fevers, chills, or recent illnesses. He has a good appetite and denies weight loss. He denies any chest pain, shortness of breath, cough, or hemoptysis. He has no nausea, vomiting, constipation, or diarrhea. He has no melena or hematochezia. He has no urinary complaints. Patient offers no further specific complaints today.   REVIEW OF SYSTEMS:   Review of Systems  Constitutional: Negative.  Negative for fever, malaise/fatigue and weight loss.  Eyes: Negative for blurred vision.  Respiratory: Negative for cough and shortness of breath.   Cardiovascular: Negative.  Negative for chest pain and leg swelling.  Gastrointestinal: Negative.  Negative for abdominal pain, constipation, diarrhea, nausea and vomiting.  Genitourinary: Negative.   Musculoskeletal: Negative.   Neurological: Positive for tingling and sensory change. Negative for weakness and headaches.  Psychiatric/Behavioral: Negative.  The patient is not nervous/anxious and does not have insomnia.    As per HPI. Otherwise, a complete review of systems is negative.   PAST MEDICAL HISTORY: Past Medical History:  Diagnosis Date  . Anemia   . BPH (benign prostatic hyperplasia)   . CKD (chronic kidney disease) stage 3, GFR 30-59 ml/min   . Clear cell renal cell carcinoma (HCC)   . Diabetes mellitus without complication ([place])     . Dyspnea    with exertion  . GERD (gastroesophageal reflux disease)   . History of gout   . History of nephrectomy    Left  . Hypertension   . Neuropathy ([place])   . Renal cell carcinoma of left kidney (HCC)    mets to lungs  . Umbilical hernia     PAST SURGICAL HISTORY: Past Surgical History:  Procedure Laterality Date  . BACK SURGERY    . ENDOBRONCHIAL ULTRASOUND N/A 10/14/2015   Procedure: ENDOBRONCHIAL ULTRASOUND;  Surgeon: [name], MD;  Location: [facility] ORS;  Service: Pulmonary;  Laterality: N/A;  . EYE SURGERY Bilateral    Cataract Extraction with IOL  . KNEE SURGERY Right   . NEPHRECTOMY      FAMILY HISTORY: Family History  Problem Relation Age of Onset  . Ovarian cancer Mother   . Diabetes Father   . Hypertension Father     ADVANCED DIRECTIVES (Y/N):  N  HEALTH MAINTENANCE: Social History  Substance Use Topics  . Smoking status: Former Smoker    Packs/day: 1.00    Types: Cigarettes  . Smokeless tobacco: Never Used  . Alcohol use 25.2 oz/week    42 Cans of beer per week     Comment: 4-5 cans of beer a day     Colonoscopy:  PAP:  Bone density:  Lipid panel:  No Known Allergies  Current Outpatient Prescriptions  Medication Sig Dispense Refill  . amLODipine (NORVASC) 10 MG tablet Take 10 mg by mouth daily.     . colchicine 0.6 MG tablet Take 0.6 mg by mouth every other day.     . gabapentin (NEURONTIN) 100 MG capsule Take 100 mg by mouth  3 (three) times daily.     Marland Kitchen LEVEMIR FLEXTOUCH 100 UNIT/ML Pen Inject 30 Units into the skin daily at 10 pm.     . losartan (COZAAR) 25 MG tablet Take 25 mg by mouth daily.    Marland Kitchen lovastatin (MEVACOR) 20 MG tablet Take 20 mg by mouth daily.     . Nebivolol HCl 20 MG TABS Take 20 mg by mouth daily.    . tamsulosin (FLOMAX) 0.4 MG CAPS capsule Take 0.4 mg by mouth daily.      No current facility-administered medications for this visit.     OBJECTIVE: Vitals:   04/30/16 1350  BP: (!) 173/88  Pulse: 69   Resp: 18  Temp: 98.6 F (37 C)     Body mass index is 25.41 kg/m.    ECOG FS:0 - Asymptomatic  General: Well-developed, well-nourished, no acute distress. Eyes: Pink conjunctiva, anicteric sclera. Lungs: Clear to auscultation bilaterally. Heart: Regular rate and rhythm. No rubs, murmurs, or gallops. Abdomen: Soft, nontender, nondistended. No organomegaly noted, normoactive bowel sounds. Musculoskeletal: No edema, cyanosis, or clubbing. Neuro: Alert, answering all questions appropriately. Cranial nerves grossly intact. Skin: No rashes or petechiae noted. Psych: Normal affect.    LAB RESULTS:  Lab Results  Component Value Date   NA 135 04/30/2016   K 4.5 04/30/2016   CL 103 04/30/2016   CO2 23 04/30/2016   GLUCOSE 211 (H) 04/30/2016   BUN 13 04/30/2016   CREATININE 1.21 04/30/2016   CALCIUM 9.3 04/30/2016   PROT 7.5 04/30/2016   ALBUMIN 3.7 04/30/2016   AST 49 (H) 04/30/2016   ALT 22 04/30/2016   ALKPHOS 274 (H) 04/30/2016   BILITOT 2.4 (H) 04/30/2016   GFRNONAA >60 04/30/2016   GFRAA >60 04/30/2016    Lab Results  Component Value Date   WBC 2.7 (L) 04/30/2016   NEUTROABS 1.6 04/30/2016   HGB 12.5 (L) 04/30/2016   HCT 35.1 (L) 04/30/2016   MCV 100.6 (H) 04/30/2016   PLT 97 (L) 04/30/2016     STUDIES: No results found.   Oncology history: Patient underwent left nephrectomy on May 16, 2012 which revealed a clear cell grade 2 renal cell carcinoma, stage TIIIa, N0, M0. Tumor size of 16 cm. Patient was noted to have renal vein involvement, but no other structures were involved. 0 of 2 lymph nodes were negative for disease. PET scan on October 20, 2015 revealed metastatic disease and patient was initiated on Votrient. This was subsequently discontinued in March 2018 secondary to progression of disease. Patient initiated second line treatment with nivolumab on Monday, April 02, 2016  ASSESSMENT: Stage IV left renal cell carcinoma with metastasis to the lungs  PLAN:     1. Stage IV left renal cell carcinoma with metastasis to the lungs:  PET scan results from March 21, 2016 reviewed independently with progression of disease in hilar lymph nodes as well as pulmonary metastasis. Votrient was discontinued and patient will proceed with cycle 3 of nivolumab today. Patient will receive a flat dose of 240 mg every 2 weeks until intolerable side effects or progression of disease. Plan to reimage in 3-4 months. Return to clinic in 3 weeks for consideration of cycle 4, secondary to a preplanned trip. Patient would then return to clinic in 5 weeks on June 04, 2016 for further evaluation and consideration of cycle 5. 2. Pancytopenia: Secondary to Votrient. Discontinue treatment as above. 3. Renal insufficiency: Patient's creatinine is within normal limits today.  4. Peripheral neuropathy: Likely  secondary to diabetes, currently taking gabapentin. Monitor. 5. Hyperbilirubinemia: Unclear etiology, but improving. Monitor.   Patient expressed understanding and was in agreement with this plan. He also understands that He can call clinic at any time with any questions, concerns, or complaints.   Cancer Staging Metastatic renal cell carcinoma to lung [facility]) Staging form: Kidney, AJCC 7th Edition - Clinical stage from 10/12/2015: Stage IV (TX, N0, M1) - Signed by Lloyd Huger, MD on 10/12/2015   Lloyd Huger, MD 04/30/16 2:19 PM

## 2016-04-30 ENCOUNTER — Inpatient Hospital Stay: Payer: Medicare HMO

## 2016-04-30 ENCOUNTER — Inpatient Hospital Stay (HOSPITAL_BASED_OUTPATIENT_CLINIC_OR_DEPARTMENT_OTHER): Payer: Medicare HMO | Admitting: Oncology

## 2016-04-30 VITALS — BP 142/79 | HR 60 | Resp 18

## 2016-04-30 VITALS — BP 173/88 | HR 69 | Temp 98.6°F | Resp 18 | Wt 182.2 lb

## 2016-04-30 DIAGNOSIS — C7802 Secondary malignant neoplasm of left lung: Principal | ICD-10-CM

## 2016-04-30 DIAGNOSIS — G629 Polyneuropathy, unspecified: Secondary | ICD-10-CM | POA: Diagnosis not present

## 2016-04-30 DIAGNOSIS — Z79899 Other long term (current) drug therapy: Secondary | ICD-10-CM | POA: Diagnosis not present

## 2016-04-30 DIAGNOSIS — C649 Malignant neoplasm of unspecified kidney, except renal pelvis: Secondary | ICD-10-CM

## 2016-04-30 DIAGNOSIS — C642 Malignant neoplasm of left kidney, except renal pelvis: Secondary | ICD-10-CM

## 2016-04-30 DIAGNOSIS — D61818 Other pancytopenia: Secondary | ICD-10-CM

## 2016-04-30 DIAGNOSIS — Z5111 Encounter for antineoplastic chemotherapy: Secondary | ICD-10-CM | POA: Diagnosis not present

## 2016-04-30 LAB — COMPREHENSIVE METABOLIC PANEL
ALBUMIN: 3.7 g/dL (ref 3.5–5.0)
ALK PHOS: 274 U/L — AB (ref 38–126)
ALT: 22 U/L (ref 17–63)
AST: 49 U/L — AB (ref 15–41)
Anion gap: 9 (ref 5–15)
BILIRUBIN TOTAL: 2.4 mg/dL — AB (ref 0.3–1.2)
BUN: 13 mg/dL (ref 6–20)
CO2: 23 mmol/L (ref 22–32)
Calcium: 9.3 mg/dL (ref 8.9–10.3)
Chloride: 103 mmol/L (ref 101–111)
Creatinine, Ser: 1.21 mg/dL (ref 0.61–1.24)
GFR calc Af Amer: >60 mL/min (ref >60)
GLUCOSE: 211 mg/dL — AB (ref 65–99)
POTASSIUM: 4.5 mmol/L (ref 3.5–5.1)
Sodium: 135 mmol/L (ref 135–145)
TOTAL PROTEIN: 7.5 g/dL (ref 6.5–8.1)

## 2016-04-30 LAB — CBC WITH DIFFERENTIAL/PLATELET
BASOS ABS: 0 10*3/uL (ref 0–0.1)
BASOS PCT: 1 %
Eosinophils Absolute: 0.3 10*3/uL (ref 0–0.7)
Eosinophils Relative: 11 %
HEMATOCRIT: 35.1 % — AB (ref 40.0–52.0)
HEMOGLOBIN: 12.5 g/dL — AB (ref 13.0–18.0)
LYMPHS PCT: 21 %
Lymphs Abs: 0.6 10*3/uL — ABNORMAL LOW (ref 1.0–3.6)
MCH: 35.8 pg — ABNORMAL HIGH (ref 26.0–34.0)
MCHC: 35.6 g/dL (ref 32.0–36.0)
MCV: 100.6 fL — AB (ref 80.0–100.0)
MONO ABS: 0.3 10*3/uL (ref 0.2–1.0)
Monocytes Relative: 11 %
Neutro Abs: 1.6 10*3/uL (ref 1.4–6.5)
Neutrophils Relative %: 56 %
Platelets: 97 10*3/uL — ABNORMAL LOW (ref 150–440)
RBC: 3.48 MIL/uL — AB (ref 4.40–5.90)
RDW: 13.5 % (ref 11.5–14.5)
WBC: 2.7 10*3/uL — AB (ref 3.8–10.6)

## 2016-04-30 MED ORDER — SODIUM CHLORIDE 0.9 % IV SOLN
Freq: Once | INTRAVENOUS | Status: AC
  Administered 2016-04-30: 15:00:00 via INTRAVENOUS
  Filled 2016-04-30: qty 1000

## 2016-04-30 MED ORDER — SODIUM CHLORIDE 0.9 % IV SOLN
240.0000 mg | Freq: Once | INTRAVENOUS | Status: AC
  Administered 2016-04-30: 240 mg via INTRAVENOUS
  Filled 2016-04-30: qty 24

## 2016-04-30 NOTE — Progress Notes (Signed)
Offers no complaints  

## 2016-05-01 LAB — THYROID PANEL WITH TSH
Free Thyroxine Index: 2.2 (ref 1.2–4.9)
T3 UPTAKE RATIO: 27 % (ref 24–39)
T4 TOTAL: 8.2 ug/dL (ref 4.5–12.0)
TSH: 7.51 u[IU]/mL — AB (ref 0.450–4.500)

## 2016-05-21 ENCOUNTER — Inpatient Hospital Stay: Payer: Medicare HMO | Attending: Oncology

## 2016-05-21 ENCOUNTER — Inpatient Hospital Stay: Payer: Medicare HMO

## 2016-05-21 VITALS — BP 147/78 | HR 60 | Temp 97.6°F | Resp 18

## 2016-05-21 DIAGNOSIS — C642 Malignant neoplasm of left kidney, except renal pelvis: Secondary | ICD-10-CM | POA: Insufficient documentation

## 2016-05-21 DIAGNOSIS — C7802 Secondary malignant neoplasm of left lung: Secondary | ICD-10-CM | POA: Diagnosis not present

## 2016-05-21 DIAGNOSIS — D61818 Other pancytopenia: Secondary | ICD-10-CM | POA: Insufficient documentation

## 2016-05-21 DIAGNOSIS — Z79899 Other long term (current) drug therapy: Secondary | ICD-10-CM | POA: Insufficient documentation

## 2016-05-21 DIAGNOSIS — G629 Polyneuropathy, unspecified: Secondary | ICD-10-CM | POA: Insufficient documentation

## 2016-05-21 DIAGNOSIS — Z5111 Encounter for antineoplastic chemotherapy: Secondary | ICD-10-CM | POA: Diagnosis present

## 2016-05-21 DIAGNOSIS — C649 Malignant neoplasm of unspecified kidney, except renal pelvis: Secondary | ICD-10-CM

## 2016-05-21 LAB — COMPREHENSIVE METABOLIC PANEL
ALBUMIN: 3.8 g/dL (ref 3.5–5.0)
ALK PHOS: 292 U/L — AB (ref 38–126)
ALT: 24 U/L (ref 17–63)
ANION GAP: 7 (ref 5–15)
AST: 49 U/L — ABNORMAL HIGH (ref 15–41)
BILIRUBIN TOTAL: 2 mg/dL — AB (ref 0.3–1.2)
BUN: 21 mg/dL — AB (ref 6–20)
CALCIUM: 9.7 mg/dL (ref 8.9–10.3)
CO2: 23 mmol/L (ref 22–32)
Chloride: 107 mmol/L (ref 101–111)
Creatinine, Ser: 1.34 mg/dL — ABNORMAL HIGH (ref 0.61–1.24)
GFR calc Af Amer: >60 mL/min (ref >60)
GFR calc non Af Amer: 56 mL/min — ABNORMAL LOW (ref >60)
GLUCOSE: 112 mg/dL — AB (ref 65–99)
Potassium: 5.1 mmol/L (ref 3.5–5.1)
Sodium: 137 mmol/L (ref 135–145)
TOTAL PROTEIN: 7.9 g/dL (ref 6.5–8.1)

## 2016-05-21 LAB — CBC WITH DIFFERENTIAL/PLATELET
Basophils Absolute: 0 10*3/uL (ref 0–0.1)
Basophils Relative: 1 %
EOS ABS: 0.8 10*3/uL — AB (ref 0–0.7)
Eosinophils Relative: 23 %
HEMATOCRIT: 35.3 % — AB (ref 40.0–52.0)
HEMOGLOBIN: 12.7 g/dL — AB (ref 13.0–18.0)
LYMPHS ABS: 0.7 10*3/uL — AB (ref 1.0–3.6)
LYMPHS PCT: 20 %
MCH: 35.1 pg — AB (ref 26.0–34.0)
MCHC: 35.9 g/dL (ref 32.0–36.0)
MCV: 97.8 fL (ref 80.0–100.0)
MONOS PCT: 10 %
Monocytes Absolute: 0.3 10*3/uL (ref 0.2–1.0)
NEUTROS PCT: 48 %
Neutro Abs: 1.6 10*3/uL (ref 1.4–6.5)
Platelets: 124 10*3/uL — ABNORMAL LOW (ref 150–440)
RBC: 3.61 MIL/uL — ABNORMAL LOW (ref 4.40–5.90)
RDW: 13.2 % (ref 11.5–14.5)
WBC: 3.4 10*3/uL — ABNORMAL LOW (ref 3.8–10.6)

## 2016-05-21 MED ORDER — SODIUM CHLORIDE 0.9 % IV SOLN
Freq: Once | INTRAVENOUS | Status: AC
  Administered 2016-05-21: 11:00:00 via INTRAVENOUS
  Filled 2016-05-21: qty 1000

## 2016-05-21 MED ORDER — SODIUM CHLORIDE 0.9 % IV SOLN
240.0000 mg | Freq: Once | INTRAVENOUS | Status: AC
  Administered 2016-05-21: 240 mg via INTRAVENOUS
  Filled 2016-05-21: qty 24

## 2016-05-22 LAB — THYROID PANEL WITH TSH
FREE THYROXINE INDEX: 1.7 (ref 1.2–4.9)
T3 UPTAKE RATIO: 26 % (ref 24–39)
T4 TOTAL: 6.7 ug/dL (ref 4.5–12.0)
TSH: 7.41 u[IU]/mL — ABNORMAL HIGH (ref 0.450–4.500)

## 2016-05-23 DIAGNOSIS — R69 Illness, unspecified: Secondary | ICD-10-CM | POA: Diagnosis not present

## 2016-06-04 ENCOUNTER — Inpatient Hospital Stay: Payer: Medicare HMO

## 2016-06-04 ENCOUNTER — Inpatient Hospital Stay: Payer: Medicare HMO | Attending: Oncology | Admitting: Oncology

## 2016-06-04 VITALS — BP 129/78 | HR 60 | Temp 97.3°F | Resp 18 | Wt 182.0 lb

## 2016-06-04 DIAGNOSIS — Z905 Acquired absence of kidney: Secondary | ICD-10-CM | POA: Insufficient documentation

## 2016-06-04 DIAGNOSIS — E119 Type 2 diabetes mellitus without complications: Secondary | ICD-10-CM | POA: Insufficient documentation

## 2016-06-04 DIAGNOSIS — K219 Gastro-esophageal reflux disease without esophagitis: Secondary | ICD-10-CM | POA: Diagnosis not present

## 2016-06-04 DIAGNOSIS — N183 Chronic kidney disease, stage 3 (moderate): Secondary | ICD-10-CM | POA: Insufficient documentation

## 2016-06-04 DIAGNOSIS — N400 Enlarged prostate without lower urinary tract symptoms: Secondary | ICD-10-CM | POA: Insufficient documentation

## 2016-06-04 DIAGNOSIS — Z87891 Personal history of nicotine dependence: Secondary | ICD-10-CM | POA: Insufficient documentation

## 2016-06-04 DIAGNOSIS — C649 Malignant neoplasm of unspecified kidney, except renal pelvis: Secondary | ICD-10-CM

## 2016-06-04 DIAGNOSIS — D61818 Other pancytopenia: Secondary | ICD-10-CM | POA: Insufficient documentation

## 2016-06-04 DIAGNOSIS — M109 Gout, unspecified: Secondary | ICD-10-CM | POA: Insufficient documentation

## 2016-06-04 DIAGNOSIS — F1010 Alcohol abuse, uncomplicated: Secondary | ICD-10-CM | POA: Diagnosis not present

## 2016-06-04 DIAGNOSIS — C642 Malignant neoplasm of left kidney, except renal pelvis: Secondary | ICD-10-CM | POA: Insufficient documentation

## 2016-06-04 DIAGNOSIS — C7802 Secondary malignant neoplasm of left lung: Secondary | ICD-10-CM | POA: Diagnosis not present

## 2016-06-04 DIAGNOSIS — C7801 Secondary malignant neoplasm of right lung: Secondary | ICD-10-CM | POA: Insufficient documentation

## 2016-06-04 DIAGNOSIS — Z79899 Other long term (current) drug therapy: Secondary | ICD-10-CM

## 2016-06-04 DIAGNOSIS — I129 Hypertensive chronic kidney disease with stage 1 through stage 4 chronic kidney disease, or unspecified chronic kidney disease: Secondary | ICD-10-CM | POA: Insufficient documentation

## 2016-06-04 DIAGNOSIS — G629 Polyneuropathy, unspecified: Secondary | ICD-10-CM | POA: Diagnosis not present

## 2016-06-04 DIAGNOSIS — Z5111 Encounter for antineoplastic chemotherapy: Secondary | ICD-10-CM | POA: Diagnosis not present

## 2016-06-04 LAB — CBC WITH DIFFERENTIAL/PLATELET
BASOS ABS: 0 10*3/uL (ref 0–0.1)
Basophils Relative: 1 %
Eosinophils Absolute: 0.5 10*3/uL (ref 0–0.7)
Eosinophils Relative: 15 %
HCT: 36 % — ABNORMAL LOW (ref 40.0–52.0)
HEMOGLOBIN: 13 g/dL (ref 13.0–18.0)
LYMPHS ABS: 0.7 10*3/uL — AB (ref 1.0–3.6)
LYMPHS PCT: 24 %
MCH: 34.6 pg — AB (ref 26.0–34.0)
MCHC: 36 g/dL (ref 32.0–36.0)
MCV: 96.1 fL (ref 80.0–100.0)
Monocytes Absolute: 0.3 10*3/uL (ref 0.2–1.0)
Monocytes Relative: 11 %
NEUTROS PCT: 49 %
Neutro Abs: 1.5 10*3/uL (ref 1.4–6.5)
Platelets: 113 10*3/uL — ABNORMAL LOW (ref 150–440)
RBC: 3.75 MIL/uL — AB (ref 4.40–5.90)
RDW: 13 % (ref 11.5–14.5)
WBC: 3 10*3/uL — AB (ref 3.8–10.6)

## 2016-06-04 LAB — COMPREHENSIVE METABOLIC PANEL
ALK PHOS: 265 U/L — AB (ref 38–126)
ALT: 23 U/L (ref 17–63)
AST: 46 U/L — ABNORMAL HIGH (ref 15–41)
Albumin: 3.6 g/dL (ref 3.5–5.0)
Anion gap: 6 (ref 5–15)
BUN: 12 mg/dL (ref 6–20)
CHLORIDE: 100 mmol/L — AB (ref 101–111)
CO2: 26 mmol/L (ref 22–32)
CREATININE: 1.38 mg/dL — AB (ref 0.61–1.24)
Calcium: 8.9 mg/dL (ref 8.9–10.3)
GFR, EST NON AFRICAN AMERICAN: 54 mL/min — AB (ref >60)
Glucose, Bld: 114 mg/dL — ABNORMAL HIGH (ref 65–99)
Potassium: 4.7 mmol/L (ref 3.5–5.1)
SODIUM: 132 mmol/L — AB (ref 135–145)
Total Bilirubin: 1.6 mg/dL — ABNORMAL HIGH (ref 0.3–1.2)
Total Protein: 7.4 g/dL (ref 6.5–8.1)

## 2016-06-04 MED ORDER — SODIUM CHLORIDE 0.9 % IV SOLN
Freq: Once | INTRAVENOUS | Status: AC
  Administered 2016-06-04: 11:00:00 via INTRAVENOUS
  Filled 2016-06-04: qty 1000

## 2016-06-04 MED ORDER — SODIUM CHLORIDE 0.9 % IV SOLN
240.0000 mg | Freq: Once | INTRAVENOUS | Status: AC
  Administered 2016-06-04: 240 mg via INTRAVENOUS
  Filled 2016-06-04: qty 24

## 2016-06-04 NOTE — Progress Notes (Signed)
[place]  Telephone:(336) [phone] Fax:(336) [phone]  ID: Rodney Stewart OB: [birth year]  MR#: [id number]  IZT#:[id number]  Patient Care Team: Doyle-Burr, Hughes Better, MD as PCP - General (Internal Medicine)  CHIEF COMPLAINT: Stage IV renal cell carcinoma with bilateral lung metastases.  INTERVAL HISTORY: Patient returns to clinic today for further evaluation and consideration of cycle 4 of nivolumab. He is tolerating his treatments well without significant side effects. He continues to have chronic peripheral neuropathy in both feet, but has no other neurologic complaints. He denies any fevers, chills, or recent illnesses. He has a good appetite and denies weight loss. He denies any chest pain, shortness of breath, cough, or hemoptysis. He has no nausea, vomiting, constipation, or diarrhea. He has no melena or hematochezia. He has no urinary complaints. Patient offers no further specific complaints today.   REVIEW OF SYSTEMS:   Review of Systems  Constitutional: Negative.  Negative for fever, malaise/fatigue and weight loss.  Eyes: Negative for blurred vision.  Respiratory: Negative for cough and shortness of breath.   Cardiovascular: Negative.  Negative for chest pain and leg swelling.  Gastrointestinal: Negative.  Negative for abdominal pain, constipation, diarrhea, nausea and vomiting.  Genitourinary: Negative.   Musculoskeletal: Negative.   Neurological: Positive for tingling and sensory change. Negative for weakness and headaches.  Psychiatric/Behavioral: Negative.  The patient is not nervous/anxious and does not have insomnia.    As per HPI. Otherwise, a complete review of systems is negative.   PAST MEDICAL HISTORY: Past Medical History:  Diagnosis Date  . Anemia   . BPH (benign prostatic hyperplasia)   . CKD (chronic kidney disease) stage 3, GFR 30-59 ml/min   . Clear cell renal cell carcinoma (HCC)   . Diabetes mellitus without complication ([place])   .  Dyspnea    with exertion  . GERD (gastroesophageal reflux disease)   . History of gout   . History of nephrectomy    Left  . Hypertension   . Neuropathy ([place])   . Renal cell carcinoma of left kidney (HCC)    mets to lungs  . Umbilical hernia     PAST SURGICAL HISTORY: Past Surgical History:  Procedure Laterality Date  . BACK SURGERY    . ENDOBRONCHIAL ULTRASOUND N/A 10/14/2015   Procedure: ENDOBRONCHIAL ULTRASOUND;  Surgeon: [name], MD;  Location: [facility] ORS;  Service: Pulmonary;  Laterality: N/A;  . EYE SURGERY Bilateral    Cataract Extraction with IOL  . KNEE SURGERY Right   . NEPHRECTOMY      FAMILY HISTORY: Family History  Problem Relation Age of Onset  . Ovarian cancer Mother   . Diabetes Father   . Hypertension Father     ADVANCED DIRECTIVES (Y/N):  N  HEALTH MAINTENANCE: Social History  Substance Use Topics  . Smoking status: Former Smoker    Packs/day: 1.00    Types: Cigarettes  . Smokeless tobacco: Never Used  . Alcohol use 25.2 oz/week    42 Cans of beer per week     Comment: 4-5 cans of beer a day     Colonoscopy:  PAP:  Bone density:  Lipid panel:  No Known Allergies  Current Outpatient Prescriptions  Medication Sig Dispense Refill  . amLODipine (NORVASC) 10 MG tablet Take 10 mg by mouth daily.     . colchicine 0.6 MG tablet Take 0.6 mg by mouth every other day.     . gabapentin (NEURONTIN) 100 MG capsule Take 100 mg by mouth 3 (  three) times daily.     Marland Kitchen LEVEMIR FLEXTOUCH 100 UNIT/ML Pen Inject 30 Units into the skin daily at 10 pm.     . losartan (COZAAR) 25 MG tablet Take 25 mg by mouth daily.    Marland Kitchen lovastatin (MEVACOR) 20 MG tablet Take 20 mg by mouth daily.     . Nebivolol HCl 20 MG TABS Take 20 mg by mouth daily.    . tamsulosin (FLOMAX) 0.4 MG CAPS capsule Take 0.4 mg by mouth daily.      No current facility-administered medications for this visit.     OBJECTIVE: Vitals:   06/04/16 1012  BP: 129/78  Pulse: 60  Resp:  18  Temp: 97.3 F (36.3 C)     Body mass index is 25.38 kg/m.    ECOG FS:0 - Asymptomatic  General: Well-developed, well-nourished, no acute distress. Eyes: Pink conjunctiva, anicteric sclera. Lungs: Clear to auscultation bilaterally. Heart: Regular rate and rhythm. No rubs, murmurs, or gallops. Abdomen: Soft, nontender, nondistended. No organomegaly noted, normoactive bowel sounds. Musculoskeletal: No edema, cyanosis, or clubbing. Neuro: Alert, answering all questions appropriately. Cranial nerves grossly intact. Skin: No rashes or petechiae noted. Psych: Normal affect.    LAB RESULTS:  Lab Results  Component Value Date   NA 132 (L) 06/04/2016   K 4.7 06/04/2016   CL 100 (L) 06/04/2016   CO2 26 06/04/2016   GLUCOSE 114 (H) 06/04/2016   BUN 12 06/04/2016   CREATININE 1.38 (H) 06/04/2016   CALCIUM 8.9 06/04/2016   PROT 7.4 06/04/2016   ALBUMIN 3.6 06/04/2016   AST 46 (H) 06/04/2016   ALT 23 06/04/2016   ALKPHOS 265 (H) 06/04/2016   BILITOT 1.6 (H) 06/04/2016   GFRNONAA 54 (L) 06/04/2016   GFRAA >60 06/04/2016    Lab Results  Component Value Date   WBC 3.0 (L) 06/04/2016   NEUTROABS 1.5 06/04/2016   HGB 13.0 06/04/2016   HCT 36.0 (L) 06/04/2016   MCV 96.1 06/04/2016   PLT 113 (L) 06/04/2016     STUDIES: No results found.   Oncology history: Patient underwent left nephrectomy on May 16, 2012 which revealed a clear cell grade 2 renal cell carcinoma, stage TIIIa, N0, M0. Tumor size of 16 cm. Patient was noted to have renal vein involvement, but no other structures were involved. 0 of 2 lymph nodes were negative for disease. PET scan on October 20, 2015 revealed metastatic disease and patient was initiated on Votrient. This was subsequently discontinued in March 2018 secondary to progression of disease. Patient initiated second line treatment with nivolumab on Monday, April 02, 2016  ASSESSMENT: Stage IV left renal cell carcinoma with metastasis to the lungs  PLAN:     1. Stage IV left renal cell carcinoma with metastasis to the lungs:  PET scan results from March 21, 2016 reviewed independently with progression of disease in hilar lymph nodes as well as pulmonary metastasis. Votrient was discontinued and patient will proceed with cycle 4 of nivolumab today. Patient will receive a flat dose of 240 mg every 2 weeks until intolerable side effects or progression of disease. Plan to reimage in 3-4 months. Return to clinic in 2 weeks for consideration of cycle 5. 2. Pancytopenia: Secondary to Votrient. Improving. 3. Renal insufficiency: Patient's creatinine is mildly today. Monitor. 4. Peripheral neuropathy: Likely secondary to diabetes, continue current dose of gabapentin. Monitor. 5. Hyperbilirubinemia: Unclear etiology, but improving. Monitor.   Patient expressed understanding and was in agreement with this plan. He also understands  that He can call clinic at any time with any questions, concerns, or complaints.   Cancer Staging Metastatic renal cell carcinoma to lung [facility]) Staging form: Kidney, AJCC 7th Edition - Clinical stage from 10/12/2015: Stage IV (TX, N0, M1) - Signed by Lloyd Huger, MD on 10/12/2015   Lloyd Huger, MD 06/06/16 7:44 AM

## 2016-06-04 NOTE — Progress Notes (Signed)
Patient here today for follow up.  Patient c/o neuropathy in feet, wanting to discuss possibly increasing gabapentin dosage

## 2016-06-05 LAB — THYROID PANEL WITH TSH
Free Thyroxine Index: 2.1 (ref 1.2–4.9)
T3 UPTAKE RATIO: 28 % (ref 24–39)
T4 TOTAL: 7.4 ug/dL (ref 4.5–12.0)
TSH: 9.63 u[IU]/mL — ABNORMAL HIGH (ref 0.450–4.500)

## 2016-06-15 DIAGNOSIS — D631 Anemia in chronic kidney disease: Secondary | ICD-10-CM | POA: Diagnosis not present

## 2016-06-15 DIAGNOSIS — I10 Essential (primary) hypertension: Secondary | ICD-10-CM | POA: Diagnosis not present

## 2016-06-15 DIAGNOSIS — N182 Chronic kidney disease, stage 2 (mild): Secondary | ICD-10-CM | POA: Diagnosis not present

## 2016-06-15 DIAGNOSIS — R809 Proteinuria, unspecified: Secondary | ICD-10-CM | POA: Diagnosis not present

## 2016-06-16 NOTE — Progress Notes (Signed)
[place]  Telephone:(336) [phone] Fax:(336) [phone]  ID: Rodney Stewart OB: Oct 28, 1955  MR#: [id number]  TGG#:[id number]  Patient Care Team: Doyle-Burr, Hughes Better, MD as PCP - General (Internal Medicine)  CHIEF COMPLAINT: Stage IV renal cell carcinoma with bilateral lung metastases.  INTERVAL HISTORY: Patient returns to clinic today for further evaluation and consideration of cycle 6 of nivolumab. He is tolerating his treatments well without significant side effects. He continues to have chronic peripheral neuropathy in both feet, but has no other neurologic complaints. He denies any fevers, chills, or recent illnesses. He has a good appetite and denies weight loss. He denies any chest pain, shortness of breath, cough, or hemoptysis. He has no nausea, vomiting, constipation, or diarrhea. He has no melena or hematochezia. He has no urinary complaints. Patient offers no further specific complaints today.   REVIEW OF SYSTEMS:   Review of Systems  Constitutional: Negative.  Negative for fever, malaise/fatigue and weight loss.  Eyes: Negative for blurred vision.  Respiratory: Negative for cough and shortness of breath.   Cardiovascular: Negative.  Negative for chest pain and leg swelling.  Gastrointestinal: Negative.  Negative for abdominal pain, constipation, diarrhea, nausea and vomiting.  Genitourinary: Negative.   Musculoskeletal: Negative.   Neurological: Positive for tingling and sensory change. Negative for weakness and headaches.  Psychiatric/Behavioral: Negative.  The patient is not nervous/anxious and does not have insomnia.    As per HPI. Otherwise, a complete review of systems is negative.   PAST MEDICAL HISTORY: Past Medical History:  Diagnosis Date  . Anemia   . BPH (benign prostatic hyperplasia)   . CKD (chronic kidney disease) stage 3, GFR 30-59 ml/min   . Clear cell renal cell carcinoma (HCC)   . Diabetes mellitus without complication ([place])   .  Dyspnea    with exertion  . GERD (gastroesophageal reflux disease)   . History of gout   . History of nephrectomy    Left  . Hypertension   . Neuropathy ([place])   . Renal cell carcinoma of left kidney (HCC)    mets to lungs  . Umbilical hernia     PAST SURGICAL HISTORY: Past Surgical History:  Procedure Laterality Date  . BACK SURGERY    . ENDOBRONCHIAL ULTRASOUND N/A 10/14/2015   Procedure: ENDOBRONCHIAL ULTRASOUND;  Surgeon: [name], MD;  Location: [facility] ORS;  Service: Pulmonary;  Laterality: N/A;  . EYE SURGERY Bilateral    Cataract Extraction with IOL  . KNEE SURGERY Right   . NEPHRECTOMY      FAMILY HISTORY: Family History  Problem Relation Age of Onset  . Ovarian cancer Mother   . Diabetes Father   . Hypertension Father     ADVANCED DIRECTIVES (Y/N):  N  HEALTH MAINTENANCE: Social History  Substance Use Topics  . Smoking status: Former Smoker    Packs/day: 1.00    Types: Cigarettes  . Smokeless tobacco: Never Used  . Alcohol use 25.2 oz/week    42 Cans of beer per week     Comment: 4-5 cans of beer a day     Colonoscopy:  PAP:  Bone density:  Lipid panel:  No Known Allergies  Current Outpatient Prescriptions  Medication Sig Dispense Refill  . ALPRAZolam (XANAX) 1 MG tablet Take 1 mg by mouth as needed for sleep.    Marland Kitchen amLODipine (NORVASC) 10 MG tablet Take 10 mg by mouth daily.     . colchicine 0.6 MG tablet Take 0.6 mg by mouth every  other day.     . gabapentin (NEURONTIN) 100 MG capsule Take 100 mg by mouth 3 (three) times daily.     Marland Kitchen LEVEMIR FLEXTOUCH 100 UNIT/ML Pen Inject 30 Units into the skin daily at 10 pm.     . losartan (COZAAR) 25 MG tablet Take 50 mg by mouth daily.     Marland Kitchen lovastatin (MEVACOR) 20 MG tablet Take 20 mg by mouth daily.     . Nebivolol HCl 20 MG TABS Take 20 mg by mouth daily.    . tamsulosin (FLOMAX) 0.4 MG CAPS capsule Take 0.4 mg by mouth daily.      No current facility-administered medications for this  visit.     OBJECTIVE: Vitals:   06/18/16 1103  BP: 135/76  Pulse: 64  Resp: 16  Temp: 97.5 F (36.4 C)     Body mass index is 24.99 kg/m.    ECOG FS:0 - Asymptomatic  General: Well-developed, well-nourished, no acute distress. Eyes: Pink conjunctiva, anicteric sclera. Lungs: Clear to auscultation bilaterally. Heart: Regular rate and rhythm. No rubs, murmurs, or gallops. Abdomen: Soft, nontender, nondistended. No organomegaly noted, normoactive bowel sounds. Musculoskeletal: No edema, cyanosis, or clubbing. Neuro: Alert, answering all questions appropriately. Cranial nerves grossly intact. Skin: No rashes or petechiae noted. Psych: Normal affect.    LAB RESULTS:  Lab Results  Component Value Date   NA 131 (L) 06/18/2016   K 4.5 06/18/2016   CL 98 (L) 06/18/2016   CO2 23 06/18/2016   GLUCOSE 99 06/18/2016   BUN 17 06/18/2016   CREATININE 1.57 (H) 06/18/2016   CALCIUM 8.9 06/18/2016   PROT 7.6 06/18/2016   ALBUMIN 3.9 06/18/2016   AST 45 (H) 06/18/2016   ALT 20 06/18/2016   ALKPHOS 250 (H) 06/18/2016   BILITOT 2.4 (H) 06/18/2016   GFRNONAA 47 (L) 06/18/2016   GFRAA 54 (L) 06/18/2016    Lab Results  Component Value Date   WBC 2.7 (L) 06/18/2016   NEUTROABS 1.3 (L) 06/18/2016   HGB 12.6 (L) 06/18/2016   HCT 35.0 (L) 06/18/2016   MCV 95.2 06/18/2016   PLT 84 (L) 06/18/2016     STUDIES: No results found.   Oncology history: Patient underwent left nephrectomy on May 16, 2012 which revealed a clear cell grade 2 renal cell carcinoma, stage TIIIa, N0, M0. Tumor size of 16 cm. Patient was noted to have renal vein involvement, but no other structures were involved. 0 of 2 lymph nodes were negative for disease. PET scan on October 20, 2015 revealed metastatic disease and patient was initiated on Votrient. This was subsequently discontinued in March 2018 secondary to progression of disease. Patient initiated second line treatment with nivolumab on Monday, April 02, 2016  ASSESSMENT: Stage IV left renal cell carcinoma with metastasis to the lungs  PLAN:    1. Stage IV left renal cell carcinoma with metastasis to the lungs:  PET scan results from March 21, 2016 reviewed independently with progression of disease in hilar lymph nodes as well as pulmonary metastasis. Votrient was discontinued and patient will proceed with cycle 6 of nivolumab today. Patient will receive a flat dose of 240 mg every 2 weeks until intolerable side effects or progression of disease. Return to clinic in 2 weeks for nivolumab only and then in 4 weeks for consideration of cycle 8 and further evaluation. Will get restaging PET scan prior to next clinic visit. 2. Pancytopenia: Stable, monitor.  3. Renal insufficiency: Patient's creatinine is mildly today. Monitor.  4. Peripheral neuropathy: Likely secondary to diabetes, continue current dose of gabapentin. Monitor. 5. Hyperbilirubinemia: Unclear etiology, but improving. Monitor.   Patient expressed understanding and was in agreement with this plan. He also understands that He can call clinic at any time with any questions, concerns, or complaints.   Cancer Staging Metastatic renal cell carcinoma to lung [facility]) Staging form: Kidney, AJCC 7th Edition - Clinical stage from 10/12/2015: Stage IV (TX, N0, M1) - Signed by Lloyd Huger, MD on 10/12/2015   Lloyd Huger, MD 06/19/16 8:33 AM

## 2016-06-18 ENCOUNTER — Inpatient Hospital Stay (HOSPITAL_BASED_OUTPATIENT_CLINIC_OR_DEPARTMENT_OTHER): Payer: Medicare HMO | Admitting: Oncology

## 2016-06-18 ENCOUNTER — Inpatient Hospital Stay: Payer: Medicare HMO

## 2016-06-18 VITALS — BP 135/76 | HR 64 | Temp 97.5°F | Resp 16 | Wt 179.2 lb

## 2016-06-18 DIAGNOSIS — C649 Malignant neoplasm of unspecified kidney, except renal pelvis: Secondary | ICD-10-CM

## 2016-06-18 DIAGNOSIS — C7802 Secondary malignant neoplasm of left lung: Principal | ICD-10-CM

## 2016-06-18 DIAGNOSIS — G629 Polyneuropathy, unspecified: Secondary | ICD-10-CM | POA: Diagnosis not present

## 2016-06-18 DIAGNOSIS — Z79899 Other long term (current) drug therapy: Secondary | ICD-10-CM | POA: Diagnosis not present

## 2016-06-18 DIAGNOSIS — Z5111 Encounter for antineoplastic chemotherapy: Secondary | ICD-10-CM | POA: Diagnosis not present

## 2016-06-18 DIAGNOSIS — C642 Malignant neoplasm of left kidney, except renal pelvis: Secondary | ICD-10-CM | POA: Diagnosis not present

## 2016-06-18 DIAGNOSIS — D61818 Other pancytopenia: Secondary | ICD-10-CM | POA: Diagnosis not present

## 2016-06-18 DIAGNOSIS — C7801 Secondary malignant neoplasm of right lung: Secondary | ICD-10-CM

## 2016-06-18 LAB — COMPREHENSIVE METABOLIC PANEL
ALBUMIN: 3.9 g/dL (ref 3.5–5.0)
ALK PHOS: 250 U/L — AB (ref 38–126)
ALT: 20 U/L (ref 17–63)
ANION GAP: 10 (ref 5–15)
AST: 45 U/L — AB (ref 15–41)
BUN: 17 mg/dL (ref 6–20)
CO2: 23 mmol/L (ref 22–32)
Calcium: 8.9 mg/dL (ref 8.9–10.3)
Chloride: 98 mmol/L — ABNORMAL LOW (ref 101–111)
Creatinine, Ser: 1.57 mg/dL — ABNORMAL HIGH (ref 0.61–1.24)
GFR calc Af Amer: 54 mL/min — ABNORMAL LOW (ref >60)
GFR calc non Af Amer: 47 mL/min — ABNORMAL LOW (ref >60)
GLUCOSE: 99 mg/dL (ref 65–99)
POTASSIUM: 4.5 mmol/L (ref 3.5–5.1)
SODIUM: 131 mmol/L — AB (ref 135–145)
Total Bilirubin: 2.4 mg/dL — ABNORMAL HIGH (ref 0.3–1.2)
Total Protein: 7.6 g/dL (ref 6.5–8.1)

## 2016-06-18 LAB — CBC WITH DIFFERENTIAL/PLATELET
BASOS ABS: 0 10*3/uL (ref 0–0.1)
BASOS PCT: 1 %
EOS ABS: 0.4 10*3/uL (ref 0–0.7)
Eosinophils Relative: 13 %
HEMATOCRIT: 35 % — AB (ref 40.0–52.0)
HEMOGLOBIN: 12.6 g/dL — AB (ref 13.0–18.0)
Lymphocytes Relative: 26 %
Lymphs Abs: 0.7 10*3/uL — ABNORMAL LOW (ref 1.0–3.6)
MCH: 34.2 pg — ABNORMAL HIGH (ref 26.0–34.0)
MCHC: 35.9 g/dL (ref 32.0–36.0)
MCV: 95.2 fL (ref 80.0–100.0)
MONOS PCT: 11 %
Monocytes Absolute: 0.3 10*3/uL (ref 0.2–1.0)
NEUTROS ABS: 1.3 10*3/uL — AB (ref 1.4–6.5)
NEUTROS PCT: 49 %
Platelets: 84 10*3/uL — ABNORMAL LOW (ref 150–440)
RBC: 3.68 MIL/uL — ABNORMAL LOW (ref 4.40–5.90)
RDW: 13.8 % (ref 11.5–14.5)
WBC: 2.7 10*3/uL — ABNORMAL LOW (ref 3.8–10.6)

## 2016-06-18 MED ORDER — SODIUM CHLORIDE 0.9 % IV SOLN
Freq: Once | INTRAVENOUS | Status: AC
  Administered 2016-06-18: 12:00:00 via INTRAVENOUS
  Filled 2016-06-18: qty 1000

## 2016-06-18 MED ORDER — SODIUM CHLORIDE 0.9 % IV SOLN
240.0000 mg | Freq: Once | INTRAVENOUS | Status: AC
  Administered 2016-06-18: 240 mg via INTRAVENOUS
  Filled 2016-06-18: qty 24

## 2016-06-18 NOTE — Progress Notes (Signed)
Patient here today for follow up.  Patient states no new concerns today  

## 2016-06-19 LAB — THYROID PANEL WITH TSH
Free Thyroxine Index: 2.4 (ref 1.2–4.9)
T3 UPTAKE RATIO: 29 % (ref 24–39)
T4, Total: 8.2 ug/dL (ref 4.5–12.0)
TSH: 8.42 u[IU]/mL — ABNORMAL HIGH (ref 0.450–4.500)

## 2016-07-02 ENCOUNTER — Inpatient Hospital Stay: Payer: Medicare HMO

## 2016-07-02 ENCOUNTER — Other Ambulatory Visit: Payer: Self-pay | Admitting: Oncology

## 2016-07-02 ENCOUNTER — Inpatient Hospital Stay: Payer: Medicare HMO | Attending: Oncology

## 2016-07-02 VITALS — BP 115/71 | HR 66 | Temp 98.3°F | Resp 18

## 2016-07-02 DIAGNOSIS — N183 Chronic kidney disease, stage 3 (moderate): Secondary | ICD-10-CM | POA: Insufficient documentation

## 2016-07-02 DIAGNOSIS — E1122 Type 2 diabetes mellitus with diabetic chronic kidney disease: Secondary | ICD-10-CM | POA: Insufficient documentation

## 2016-07-02 DIAGNOSIS — Z905 Acquired absence of kidney: Secondary | ICD-10-CM | POA: Insufficient documentation

## 2016-07-02 DIAGNOSIS — C7802 Secondary malignant neoplasm of left lung: Principal | ICD-10-CM

## 2016-07-02 DIAGNOSIS — R188 Other ascites: Secondary | ICD-10-CM | POA: Diagnosis not present

## 2016-07-02 DIAGNOSIS — R161 Splenomegaly, not elsewhere classified: Secondary | ICD-10-CM | POA: Insufficient documentation

## 2016-07-02 DIAGNOSIS — Z87891 Personal history of nicotine dependence: Secondary | ICD-10-CM | POA: Insufficient documentation

## 2016-07-02 DIAGNOSIS — D61818 Other pancytopenia: Secondary | ICD-10-CM | POA: Insufficient documentation

## 2016-07-02 DIAGNOSIS — I129 Hypertensive chronic kidney disease with stage 1 through stage 4 chronic kidney disease, or unspecified chronic kidney disease: Secondary | ICD-10-CM | POA: Insufficient documentation

## 2016-07-02 DIAGNOSIS — K7460 Unspecified cirrhosis of liver: Secondary | ICD-10-CM | POA: Insufficient documentation

## 2016-07-02 DIAGNOSIS — K219 Gastro-esophageal reflux disease without esophagitis: Secondary | ICD-10-CM | POA: Diagnosis not present

## 2016-07-02 DIAGNOSIS — G629 Polyneuropathy, unspecified: Secondary | ICD-10-CM | POA: Insufficient documentation

## 2016-07-02 DIAGNOSIS — Z5112 Encounter for antineoplastic immunotherapy: Secondary | ICD-10-CM | POA: Insufficient documentation

## 2016-07-02 DIAGNOSIS — R0602 Shortness of breath: Secondary | ICD-10-CM | POA: Insufficient documentation

## 2016-07-02 DIAGNOSIS — C7801 Secondary malignant neoplasm of right lung: Secondary | ICD-10-CM | POA: Diagnosis not present

## 2016-07-02 DIAGNOSIS — K8020 Calculus of gallbladder without cholecystitis without obstruction: Secondary | ICD-10-CM | POA: Diagnosis not present

## 2016-07-02 DIAGNOSIS — I700 Atherosclerosis of aorta: Secondary | ICD-10-CM | POA: Insufficient documentation

## 2016-07-02 DIAGNOSIS — M109 Gout, unspecified: Secondary | ICD-10-CM | POA: Diagnosis not present

## 2016-07-02 DIAGNOSIS — Z794 Long term (current) use of insulin: Secondary | ICD-10-CM | POA: Diagnosis not present

## 2016-07-02 DIAGNOSIS — N400 Enlarged prostate without lower urinary tract symptoms: Secondary | ICD-10-CM | POA: Diagnosis not present

## 2016-07-02 DIAGNOSIS — C642 Malignant neoplasm of left kidney, except renal pelvis: Secondary | ICD-10-CM | POA: Insufficient documentation

## 2016-07-02 DIAGNOSIS — C649 Malignant neoplasm of unspecified kidney, except renal pelvis: Secondary | ICD-10-CM

## 2016-07-02 LAB — CBC WITH DIFFERENTIAL/PLATELET
BASOS ABS: 0 10*3/uL (ref 0–0.1)
BASOS PCT: 1 %
EOS PCT: 7 %
Eosinophils Absolute: 0.2 10*3/uL (ref 0–0.7)
HCT: 34.2 % — ABNORMAL LOW (ref 40.0–52.0)
Hemoglobin: 12.2 g/dL — ABNORMAL LOW (ref 13.0–18.0)
LYMPHS PCT: 22 %
Lymphs Abs: 0.7 10*3/uL — ABNORMAL LOW (ref 1.0–3.6)
MCH: 34.4 pg — ABNORMAL HIGH (ref 26.0–34.0)
MCHC: 35.8 g/dL (ref 32.0–36.0)
MCV: 96 fL (ref 80.0–100.0)
MONO ABS: 0.3 10*3/uL (ref 0.2–1.0)
Monocytes Relative: 11 %
Neutro Abs: 1.8 10*3/uL (ref 1.4–6.5)
Neutrophils Relative %: 59 %
PLATELETS: 129 10*3/uL — AB (ref 150–440)
RBC: 3.56 MIL/uL — AB (ref 4.40–5.90)
RDW: 14 % (ref 11.5–14.5)
WBC: 3.1 10*3/uL — AB (ref 3.8–10.6)

## 2016-07-02 LAB — COMPREHENSIVE METABOLIC PANEL
ALBUMIN: 3.8 g/dL (ref 3.5–5.0)
ALT: 32 U/L (ref 17–63)
AST: 74 U/L — AB (ref 15–41)
Alkaline Phosphatase: 267 U/L — ABNORMAL HIGH (ref 38–126)
Anion gap: 6 (ref 5–15)
BUN: 17 mg/dL (ref 6–20)
CHLORIDE: 101 mmol/L (ref 101–111)
CO2: 25 mmol/L (ref 22–32)
CREATININE: 1.49 mg/dL — AB (ref 0.61–1.24)
Calcium: 8.8 mg/dL — ABNORMAL LOW (ref 8.9–10.3)
GFR calc Af Amer: 58 mL/min — ABNORMAL LOW (ref >60)
GFR calc non Af Amer: 50 mL/min — ABNORMAL LOW (ref >60)
GLUCOSE: 244 mg/dL — AB (ref 65–99)
POTASSIUM: 5.3 mmol/L — AB (ref 3.5–5.1)
SODIUM: 132 mmol/L — AB (ref 135–145)
Total Bilirubin: 1.9 mg/dL — ABNORMAL HIGH (ref 0.3–1.2)
Total Protein: 7.6 g/dL (ref 6.5–8.1)

## 2016-07-02 MED ORDER — SODIUM CHLORIDE 0.9 % IV SOLN
240.0000 mg | Freq: Once | INTRAVENOUS | Status: AC
  Administered 2016-07-02: 240 mg via INTRAVENOUS
  Filled 2016-07-02: qty 24

## 2016-07-02 MED ORDER — SODIUM CHLORIDE 0.9 % IV SOLN
Freq: Once | INTRAVENOUS | Status: AC
  Administered 2016-07-02: 14:00:00 via INTRAVENOUS
  Filled 2016-07-02: qty 1000

## 2016-07-02 NOTE — Progress Notes (Signed)
MD, Dr. Grayland Ormond, notified of labs via telephone. Per MD order: proceed with treatment today.

## 2016-07-03 LAB — THYROID PANEL WITH TSH
Free Thyroxine Index: 2.2 (ref 1.2–4.9)
T3 Uptake Ratio: 27 % (ref 24–39)
T4 TOTAL: 8.1 ug/dL (ref 4.5–12.0)
TSH: 4.52 u[IU]/mL — AB (ref 0.450–4.500)

## 2016-07-09 ENCOUNTER — Encounter
Admission: RE | Admit: 2016-07-09 | Discharge: 2016-07-09 | Disposition: A | Discharge status: Home / Self Care | Payer: Medicare HMO | Source: Ambulatory Visit | Attending: Oncology | Admitting: Oncology

## 2016-07-09 DIAGNOSIS — C649 Malignant neoplasm of unspecified kidney, except renal pelvis: Secondary | ICD-10-CM | POA: Diagnosis not present

## 2016-07-09 DIAGNOSIS — C7802 Secondary malignant neoplasm of left lung: Secondary | ICD-10-CM | POA: Insufficient documentation

## 2016-07-09 LAB — GLUCOSE, CAPILLARY: Glucose-Capillary: 147 mg/dL — ABNORMAL HIGH (ref 65–99)

## 2016-07-09 MED ORDER — FLUDEOXYGLUCOSE F - 18 (FDG) INJECTION
12.0000 | Freq: Once | INTRAVENOUS | Status: AC | PRN
  Administered 2016-07-09: 12.41 via INTRAVENOUS

## 2016-07-15 NOTE — Progress Notes (Signed)
[place]  Telephone:(336) [phone] Fax:(336) [phone]  ID: Rodney Stewart OB: [birth year]  MR#: [id number]  VOZ#:[id number]  Patient Care Team: Patient, No Pcp Per as PCP - General (General Practice)  CHIEF COMPLAINT: Stage IV renal cell carcinoma with bilateral lung metastases.  INTERVAL HISTORY: Patient returns to clinic today for further evaluation, discussion of his imaging results, and consideration of cycle 8 of nivolumab. He is tolerating his treatments well without significant side effects. He continues to have chronic peripheral neuropathy in both feet, but has no other neurologic complaints. He denies any fevers, chills, or recent illnesses. He has a good appetite and denies weight loss. He denies any chest pain, shortness of breath, cough, or hemoptysis. He has no nausea, vomiting, constipation, or diarrhea. He has no melena or hematochezia. He has no urinary complaints. Patient offers no further specific complaints today.   REVIEW OF SYSTEMS:   Review of Systems  Constitutional: Negative.  Negative for fever, malaise/fatigue and weight loss.  Eyes: Negative for blurred vision.  Respiratory: Negative for cough and shortness of breath.   Cardiovascular: Negative.  Negative for chest pain and leg swelling.  Gastrointestinal: Negative.  Negative for abdominal pain, constipation, diarrhea, nausea and vomiting.  Genitourinary: Negative.   Musculoskeletal: Negative.   Neurological: Positive for tingling and sensory change. Negative for weakness and headaches.  Psychiatric/Behavioral: Negative.  The patient is not nervous/anxious and does not have insomnia.    As per HPI. Otherwise, a complete review of systems is negative.   PAST MEDICAL HISTORY: Past Medical History:  Diagnosis Date  . Anemia   . BPH (benign prostatic hyperplasia)   . CKD (chronic kidney disease) stage 3, GFR 30-59 ml/min   . Clear cell renal cell carcinoma (HCC)   . Diabetes mellitus  without complication ([place])   . Dyspnea    with exertion  . GERD (gastroesophageal reflux disease)   . History of gout   . History of nephrectomy    Left  . Hypertension   . Neuropathy   . Renal cell carcinoma of left kidney (HCC)    mets to lungs  . Umbilical hernia     PAST SURGICAL HISTORY: Past Surgical History:  Procedure Laterality Date  . BACK SURGERY    . ENDOBRONCHIAL ULTRASOUND N/A 10/14/2015   Procedure: ENDOBRONCHIAL ULTRASOUND;  Surgeon: [name], MD;  Location: [facility] ORS;  Service: Pulmonary;  Laterality: N/A;  . EYE SURGERY Bilateral    Cataract Extraction with IOL  . KNEE SURGERY Right   . NEPHRECTOMY      FAMILY HISTORY: Family History  Problem Relation Age of Onset  . Ovarian cancer Mother   . Diabetes Father   . Hypertension Father     ADVANCED DIRECTIVES (Y/N):  N  HEALTH MAINTENANCE: Social History  Substance Use Topics  . Smoking status: Former Smoker    Packs/day: 1.00    Types: Cigarettes  . Smokeless tobacco: Never Used  . Alcohol use 25.2 oz/week    42 Cans of beer per week     Comment: 4-5 cans of beer a day     Colonoscopy:  PAP:  Bone density:  Lipid panel:  No Known Allergies  Current Outpatient Prescriptions  Medication Sig Dispense Refill  . ALPRAZolam (XANAX) 1 MG tablet Take 1 mg by mouth as needed for sleep.    Marland Kitchen amLODipine (NORVASC) 10 MG tablet Take 10 mg by mouth daily.     . colchicine 0.6 MG tablet Take 0.6  mg by mouth every other day.     . gabapentin (NEURONTIN) 100 MG capsule Take 100 mg by mouth 3 (three) times daily.     Marland Kitchen LEVEMIR FLEXTOUCH 100 UNIT/ML Pen Inject 30 Units into the skin daily at 10 pm.     . losartan (COZAAR) 25 MG tablet Take 50 mg by mouth daily.     Marland Kitchen lovastatin (MEVACOR) 20 MG tablet Take 20 mg by mouth daily.     . Nebivolol HCl 20 MG TABS Take 20 mg by mouth daily.    . tamsulosin (FLOMAX) 0.4 MG CAPS capsule Take 0.4 mg by mouth daily.      No current facility-administered  medications for this visit.     OBJECTIVE: Vitals:   07/16/16 1440  BP: (!) 144/74  Pulse: 63  Resp: 20  Temp: (!) 97.1 F (36.2 C)     Body mass index is 24.85 kg/m.    ECOG FS:0 - Asymptomatic  General: Well-developed, well-nourished, no acute distress. Eyes: Pink conjunctiva, anicteric sclera. Lungs: Clear to auscultation bilaterally. Heart: Regular rate and rhythm. No rubs, murmurs, or gallops. Abdomen: Soft, nontender, nondistended. No organomegaly noted, normoactive bowel sounds. Musculoskeletal: No edema, cyanosis, or clubbing. Neuro: Alert, answering all questions appropriately. Cranial nerves grossly intact. Skin: No rashes or petechiae noted. Psych: Normal affect.    LAB RESULTS:  Lab Results  Component Value Date   NA 129 (L) 07/16/2016   K 4.8 07/16/2016   CL 101 07/16/2016   CO2 20 (L) 07/16/2016   GLUCOSE 335 (H) 07/16/2016   BUN 17 07/16/2016   CREATININE 1.50 (H) 07/16/2016   CALCIUM 8.9 07/16/2016   PROT 7.1 07/16/2016   ALBUMIN 3.5 07/16/2016   AST 52 (H) 07/16/2016   ALT 27 07/16/2016   ALKPHOS 259 (H) 07/16/2016   BILITOT 1.4 (H) 07/16/2016   GFRNONAA 49 (L) 07/16/2016   GFRAA 57 (L) 07/16/2016    Lab Results  Component Value Date   WBC 2.6 (L) 07/16/2016   NEUTROABS 1.7 07/16/2016   HGB 11.2 (L) 07/16/2016   HCT 32.2 (L) 07/16/2016   MCV 97.3 07/16/2016   PLT 95 (L) 07/16/2016     STUDIES: Nm Pet Image Restag (ps) Skull Base To Thigh  Result Date: 07/09/2016 CLINICAL DATA:  Subsequent treatment strategy for renal cell carcinoma. EXAM: NUCLEAR MEDICINE PET SKULL BASE TO THIGH TECHNIQUE: 1214 mCi F-18 FDG was injected intravenously. Full-ring PET imaging was performed from the skull base to thigh after the radiotracer. CT data was obtained and used for attenuation correction and anatomic localization. FASTING BLOOD GLUCOSE:  Value: 147 mg/dl COMPARISON:  03/21/2016. FINDINGS: NECK No hypermetabolic lymph nodes in the neck. CT images show  no acute findings. CHEST Bi hilar hypermetabolism has decreased to 2.4 on the right (previously 6.0) and 2.8 on the left (previously 3.8). Subcarinal lymph node measures 2.8 cm with an SUV max of 3.9, compared to 3.6 cm and SUV max of 7.2 previously. Left lower lobe nodule measures 2.4 cm with an SUV max of 2.6, compared to 2.5 cm with an SUV max of 4.1 previously. Additional scattered tiny pulmonary nodules are stable. No pericardial or pleural effusion. Atherosclerotic calcification of the arterial vasculature, including three-vessel involvement of the coronary arteries. ABDOMEN/PELVIS No abnormal hypermetabolism in the liver, adrenal glands, spleen or pancreas. No hypermetabolic lymph nodes. Liver margin is irregular. Stones are seen in the gallbladder. Pancreas is unremarkable. Spleen is enlarged. Right adrenal gland and right kidney are grossly unremarkable.  Left adrenalectomy and left nephrectomy. Stomach and bowel are grossly unremarkable. Midline supraumbilical ventral hernia contains omental fat. Small ascites. Bladder wall appears thickened but the bladder is under distended. SKELETON No abnormal osseous hypermetabolism. IMPRESSION: 1. Decreased hypermetabolism within left hilar and subcarinal lymph nodes and a left lower lobe nodule. Subcarinal lymph node has decreased slightly in size. 2. Additional scattered tiny pulmonary nodules are stable. 3. Cirrhosis with splenomegaly and ascites. 4. Cholelithiasis. 5. Aortic atherosclerosis (ICD10-170.0). Coronary artery calcification. Electronically Signed   By: Lorin Picket M.D.   On: 07/09/2016 16:28     Oncology history: Patient underwent left nephrectomy on May 16, 2012 which revealed a clear cell grade 2 renal cell carcinoma, stage TIIIa, N0, M0. Tumor size of 16 cm. Patient was noted to have renal vein involvement, but no other structures were involved. 0 of 2 lymph nodes were negative for disease. PET scan on October 20, 2015 revealed metastatic  disease and patient was initiated on Votrient. This was subsequently discontinued in March 2018 secondary to progression of disease. Patient initiated second line treatment with nivolumab on Monday, April 02, 2016  ASSESSMENT: Stage IV left renal cell carcinoma with metastasis to the lungs  PLAN:    1. Stage IV left renal cell carcinoma with metastasis to the lungs:  PET scan results from July 09, 2016 reviewed independently with improvement of disease burden. Votrient was discontinued and patient will proceed with cycle 8 of nivolumab today. Patient will receive a flat dose of 240 mg every 2 weeks until intolerable side effects or progression of disease. Return to clinic in 2 weeks for nivolumab only and then in 4 weeks for consideration of cycle 10 and further evaluation.  2. Pancytopenia: Stable, monitor.  3. Renal insufficiency: Patient's creatinine is mildly today. Monitor. 4. Peripheral neuropathy: Likely secondary to diabetes, continue current dose of gabapentin. Monitor. 5. Hyperbilirubinemia: Unclear etiology, but improving. Monitor.   Patient expressed understanding and was in agreement with this plan. He also understands that He can call clinic at any time with any questions, concerns, or complaints.   Cancer Staging Metastatic renal cell carcinoma to lung [facility]) Staging form: Kidney, AJCC 7th Edition - Clinical stage from 10/12/2015: Stage IV (TX, N0, M1) - Signed by Lloyd Huger, MD on 10/12/2015   Lloyd Huger, MD 07/20/16 9:22 AM

## 2016-07-16 ENCOUNTER — Inpatient Hospital Stay: Payer: Medicare HMO

## 2016-07-16 ENCOUNTER — Inpatient Hospital Stay (HOSPITAL_BASED_OUTPATIENT_CLINIC_OR_DEPARTMENT_OTHER): Payer: Medicare HMO | Admitting: Oncology

## 2016-07-16 VITALS — BP 144/74 | HR 63 | Temp 97.1°F | Resp 20 | Wt 178.2 lb

## 2016-07-16 DIAGNOSIS — K8020 Calculus of gallbladder without cholecystitis without obstruction: Secondary | ICD-10-CM

## 2016-07-16 DIAGNOSIS — K7460 Unspecified cirrhosis of liver: Secondary | ICD-10-CM

## 2016-07-16 DIAGNOSIS — R0602 Shortness of breath: Secondary | ICD-10-CM

## 2016-07-16 DIAGNOSIS — N400 Enlarged prostate without lower urinary tract symptoms: Secondary | ICD-10-CM

## 2016-07-16 DIAGNOSIS — Z905 Acquired absence of kidney: Secondary | ICD-10-CM

## 2016-07-16 DIAGNOSIS — G629 Polyneuropathy, unspecified: Secondary | ICD-10-CM

## 2016-07-16 DIAGNOSIS — K219 Gastro-esophageal reflux disease without esophagitis: Secondary | ICD-10-CM

## 2016-07-16 DIAGNOSIS — C7802 Secondary malignant neoplasm of left lung: Principal | ICD-10-CM

## 2016-07-16 DIAGNOSIS — D61818 Other pancytopenia: Secondary | ICD-10-CM

## 2016-07-16 DIAGNOSIS — C649 Malignant neoplasm of unspecified kidney, except renal pelvis: Secondary | ICD-10-CM

## 2016-07-16 DIAGNOSIS — C642 Malignant neoplasm of left kidney, except renal pelvis: Secondary | ICD-10-CM | POA: Diagnosis not present

## 2016-07-16 DIAGNOSIS — N183 Chronic kidney disease, stage 3 (moderate): Secondary | ICD-10-CM | POA: Diagnosis not present

## 2016-07-16 DIAGNOSIS — Z5112 Encounter for antineoplastic immunotherapy: Secondary | ICD-10-CM | POA: Diagnosis not present

## 2016-07-16 DIAGNOSIS — I700 Atherosclerosis of aorta: Secondary | ICD-10-CM

## 2016-07-16 DIAGNOSIS — E1122 Type 2 diabetes mellitus with diabetic chronic kidney disease: Secondary | ICD-10-CM

## 2016-07-16 DIAGNOSIS — Z794 Long term (current) use of insulin: Secondary | ICD-10-CM

## 2016-07-16 DIAGNOSIS — I129 Hypertensive chronic kidney disease with stage 1 through stage 4 chronic kidney disease, or unspecified chronic kidney disease: Secondary | ICD-10-CM | POA: Diagnosis not present

## 2016-07-16 DIAGNOSIS — R161 Splenomegaly, not elsewhere classified: Secondary | ICD-10-CM

## 2016-07-16 DIAGNOSIS — M109 Gout, unspecified: Secondary | ICD-10-CM

## 2016-07-16 DIAGNOSIS — C7801 Secondary malignant neoplasm of right lung: Secondary | ICD-10-CM | POA: Diagnosis not present

## 2016-07-16 DIAGNOSIS — Z87891 Personal history of nicotine dependence: Secondary | ICD-10-CM

## 2016-07-16 DIAGNOSIS — R188 Other ascites: Secondary | ICD-10-CM

## 2016-07-16 LAB — CBC WITH DIFFERENTIAL/PLATELET
BASOS PCT: 1 %
Basophils Absolute: 0 10*3/uL (ref 0–0.1)
EOS ABS: 0.1 10*3/uL (ref 0–0.7)
EOS PCT: 4 %
HCT: 32.2 % — ABNORMAL LOW (ref 40.0–52.0)
HEMOGLOBIN: 11.2 g/dL — AB (ref 13.0–18.0)
Lymphocytes Relative: 19 %
Lymphs Abs: 0.5 10*3/uL — ABNORMAL LOW (ref 1.0–3.6)
MCH: 33.9 pg (ref 26.0–34.0)
MCHC: 34.9 g/dL (ref 32.0–36.0)
MCV: 97.3 fL (ref 80.0–100.0)
Monocytes Absolute: 0.3 10*3/uL (ref 0.2–1.0)
Monocytes Relative: 10 %
NEUTROS PCT: 66 %
Neutro Abs: 1.7 10*3/uL (ref 1.4–6.5)
PLATELETS: 95 10*3/uL — AB (ref 150–440)
RBC: 3.31 MIL/uL — AB (ref 4.40–5.90)
RDW: 14.1 % (ref 11.5–14.5)
WBC: 2.6 10*3/uL — AB (ref 3.8–10.6)

## 2016-07-16 LAB — COMPREHENSIVE METABOLIC PANEL
ALBUMIN: 3.5 g/dL (ref 3.5–5.0)
ALK PHOS: 259 U/L — AB (ref 38–126)
ALT: 27 U/L (ref 17–63)
ANION GAP: 8 (ref 5–15)
AST: 52 U/L — ABNORMAL HIGH (ref 15–41)
BUN: 17 mg/dL (ref 6–20)
CALCIUM: 8.9 mg/dL (ref 8.9–10.3)
CHLORIDE: 101 mmol/L (ref 101–111)
CO2: 20 mmol/L — AB (ref 22–32)
Creatinine, Ser: 1.5 mg/dL — ABNORMAL HIGH (ref 0.61–1.24)
GFR calc non Af Amer: 49 mL/min — ABNORMAL LOW (ref >60)
GFR, EST AFRICAN AMERICAN: 57 mL/min — AB (ref >60)
GLUCOSE: 335 mg/dL — AB (ref 65–99)
Potassium: 4.8 mmol/L (ref 3.5–5.1)
SODIUM: 129 mmol/L — AB (ref 135–145)
Total Bilirubin: 1.4 mg/dL — ABNORMAL HIGH (ref 0.3–1.2)
Total Protein: 7.1 g/dL (ref 6.5–8.1)

## 2016-07-16 MED ORDER — SODIUM CHLORIDE 0.9 % IV SOLN
Freq: Once | INTRAVENOUS | Status: AC
  Administered 2016-07-16: 16:00:00 via INTRAVENOUS
  Filled 2016-07-16: qty 1000

## 2016-07-16 MED ORDER — SODIUM CHLORIDE 0.9 % IV SOLN
240.0000 mg | Freq: Once | INTRAVENOUS | Status: AC
  Administered 2016-07-16: 240 mg via INTRAVENOUS
  Filled 2016-07-16: qty 24

## 2016-07-16 NOTE — Progress Notes (Signed)
Patient denies any concerns today.  

## 2016-07-17 DIAGNOSIS — R69 Illness, unspecified: Secondary | ICD-10-CM | POA: Diagnosis not present

## 2016-07-17 LAB — THYROID PANEL WITH TSH
FREE THYROXINE INDEX: 1.8 (ref 1.2–4.9)
T3 UPTAKE RATIO: 25 % (ref 24–39)
T4 TOTAL: 7 ug/dL (ref 4.5–12.0)
TSH: 5.83 u[IU]/mL — ABNORMAL HIGH (ref 0.450–4.500)

## 2016-07-30 ENCOUNTER — Inpatient Hospital Stay: Payer: Medicare HMO

## 2016-07-30 ENCOUNTER — Other Ambulatory Visit: Payer: Self-pay

## 2016-07-30 VITALS — BP 119/71 | HR 58 | Temp 98.2°F | Resp 20

## 2016-07-30 DIAGNOSIS — C649 Malignant neoplasm of unspecified kidney, except renal pelvis: Secondary | ICD-10-CM

## 2016-07-30 DIAGNOSIS — C7802 Secondary malignant neoplasm of left lung: Principal | ICD-10-CM

## 2016-07-30 DIAGNOSIS — Z5112 Encounter for antineoplastic immunotherapy: Secondary | ICD-10-CM | POA: Diagnosis not present

## 2016-07-30 LAB — CBC WITH DIFFERENTIAL/PLATELET
BASOS ABS: 0 10*3/uL (ref 0–0.1)
Basophils Relative: 1 %
EOS PCT: 6 %
Eosinophils Absolute: 0.1 10*3/uL (ref 0–0.7)
HCT: 28.5 % — ABNORMAL LOW (ref 40.0–52.0)
Hemoglobin: 9.8 g/dL — ABNORMAL LOW (ref 13.0–18.0)
LYMPHS ABS: 0.5 10*3/uL — AB (ref 1.0–3.6)
LYMPHS PCT: 20 %
MCH: 32.7 pg (ref 26.0–34.0)
MCHC: 34.6 g/dL (ref 32.0–36.0)
MCV: 94.6 fL (ref 80.0–100.0)
Monocytes Absolute: 0.3 10*3/uL (ref 0.2–1.0)
Monocytes Relative: 11 %
NEUTROS ABS: 1.4 10*3/uL (ref 1.4–6.5)
Neutrophils Relative %: 62 %
PLATELETS: 107 10*3/uL — AB (ref 150–440)
RBC: 3.01 MIL/uL — AB (ref 4.40–5.90)
RDW: 13.3 % (ref 11.5–14.5)
WBC: 2.3 10*3/uL — AB (ref 3.8–10.6)

## 2016-07-30 LAB — COMPREHENSIVE METABOLIC PANEL
ALK PHOS: 252 U/L — AB (ref 38–126)
ALT: 22 U/L (ref 17–63)
AST: 47 U/L — AB (ref 15–41)
Albumin: 3.5 g/dL (ref 3.5–5.0)
Anion gap: 7 (ref 5–15)
BILIRUBIN TOTAL: 1.5 mg/dL — AB (ref 0.3–1.2)
BUN: 17 mg/dL (ref 6–20)
CALCIUM: 8.7 mg/dL — AB (ref 8.9–10.3)
CO2: 24 mmol/L (ref 22–32)
CREATININE: 1.57 mg/dL — AB (ref 0.61–1.24)
Chloride: 102 mmol/L (ref 101–111)
GFR, EST AFRICAN AMERICAN: 54 mL/min — AB (ref >60)
GFR, EST NON AFRICAN AMERICAN: 46 mL/min — AB (ref >60)
Glucose, Bld: 338 mg/dL — ABNORMAL HIGH (ref 65–99)
Potassium: 5.3 mmol/L — ABNORMAL HIGH (ref 3.5–5.1)
Sodium: 133 mmol/L — ABNORMAL LOW (ref 135–145)
TOTAL PROTEIN: 7 g/dL (ref 6.5–8.1)

## 2016-07-30 MED ORDER — SODIUM CHLORIDE 0.9 % IV SOLN
Freq: Once | INTRAVENOUS | Status: AC
  Administered 2016-07-30: 14:00:00 via INTRAVENOUS
  Filled 2016-07-30: qty 1000

## 2016-07-30 MED ORDER — NIVOLUMAB CHEMO INJECTION 100 MG/10ML
240.0000 mg | Freq: Once | INTRAVENOUS | Status: AC
  Administered 2016-07-30: 240 mg via INTRAVENOUS
  Filled 2016-07-30: qty 24

## 2016-07-31 LAB — THYROID PANEL WITH TSH
FREE THYROXINE INDEX: 2.1 (ref 1.2–4.9)
T3 UPTAKE RATIO: 27 % (ref 24–39)
T4 TOTAL: 7.6 ug/dL (ref 4.5–12.0)
TSH: 4.77 u[IU]/mL — ABNORMAL HIGH (ref 0.450–4.500)

## 2016-08-02 DIAGNOSIS — E113391 Type 2 diabetes mellitus with moderate nonproliferative diabetic retinopathy without macular edema, right eye: Secondary | ICD-10-CM | POA: Diagnosis not present

## 2016-08-02 DIAGNOSIS — Z794 Long term (current) use of insulin: Secondary | ICD-10-CM | POA: Diagnosis not present

## 2016-08-02 DIAGNOSIS — B1910 Unspecified viral hepatitis B without hepatic coma: Secondary | ICD-10-CM | POA: Diagnosis not present

## 2016-08-02 DIAGNOSIS — Z789 Other specified health status: Secondary | ICD-10-CM | POA: Insufficient documentation

## 2016-08-02 DIAGNOSIS — Z6825 Body mass index (BMI) 25.0-25.9, adult: Secondary | ICD-10-CM | POA: Diagnosis not present

## 2016-08-02 DIAGNOSIS — Z23 Encounter for immunization: Secondary | ICD-10-CM | POA: Diagnosis not present

## 2016-08-12 NOTE — Progress Notes (Signed)
[place]  Telephone:(336) [phone] Fax:(336) [phone]  ID: Diamantina Providence OB: Oct 05, 1955  MR#: [id number]  NGE#:[id number]  Rodney Stewart Care Team: Rodney Stewart, No Pcp Per as PCP - General (General Practice)  CHIEF COMPLAINT: Stage IV renal cell carcinoma with bilateral lung metastases.  INTERVAL HISTORY: Rodney Stewart returns to clinic today for further evaluation and consideration of cycle 10 of nivolumab. He is tolerating his treatments well without significant side effects. He continues to have chronic peripheral neuropathy in both feet, but has no other neurologic complaints. He denies any fevers, chills, or recent illnesses. He has a good appetite and denies weight loss. He denies any chest pain, shortness of breath, cough, or hemoptysis. He has no nausea, vomiting, constipation, or diarrhea. He has no melena or hematochezia. He has no urinary complaints. Rodney Stewart offers no further specific complaints today.   REVIEW OF SYSTEMS:   Review of Systems  Constitutional: Negative.  Negative for fever, malaise/fatigue and weight loss.  Eyes: Negative for blurred vision.  Respiratory: Negative for cough and shortness of breath.   Cardiovascular: Negative.  Negative for chest pain and leg swelling.  Gastrointestinal: Negative.  Negative for abdominal pain, constipation, diarrhea, nausea and vomiting.  Genitourinary: Negative.   Musculoskeletal: Negative.   Neurological: Positive for tingling and sensory change. Negative for weakness and headaches.  Psychiatric/Behavioral: Negative.  The Rodney Stewart is not nervous/anxious and does not have insomnia.    As per HPI. Otherwise, a complete review of systems is negative.   PAST MEDICAL HISTORY: Past Medical History:  Diagnosis Date  . Anemia   . BPH (benign prostatic hyperplasia)   . CKD (chronic kidney disease) stage 3, GFR 30-59 ml/min   . Clear cell renal cell carcinoma (HCC)   . Diabetes mellitus without complication ([place])   . Dyspnea      with exertion  . GERD (gastroesophageal reflux disease)   . History of gout   . History of nephrectomy    Left  . Hypertension   . Neuropathy   . Renal cell carcinoma of left kidney (HCC)    mets to lungs  . Umbilical hernia     PAST SURGICAL HISTORY: Past Surgical History:  Procedure Laterality Date  . BACK SURGERY    . ENDOBRONCHIAL ULTRASOUND N/A 10/14/2015   Procedure: ENDOBRONCHIAL ULTRASOUND;  Surgeon: [name], MD;  Location: [facility] ORS;  Service: Pulmonary;  Laterality: N/A;  . EYE SURGERY Bilateral    Cataract Extraction with IOL  . KNEE SURGERY Right   . NEPHRECTOMY      FAMILY HISTORY: Family History  Problem Relation Age of Onset  . Ovarian cancer Mother   . Diabetes Father   . Hypertension Father     ADVANCED DIRECTIVES (Y/N):  N  HEALTH MAINTENANCE: Social History  Substance Use Topics  . Smoking status: Former Smoker    Packs/day: 1.00    Types: Cigarettes  . Smokeless tobacco: Never Used  . Alcohol use 25.2 oz/week    42 Cans of beer per week     Comment: 4-5 cans of beer a day     Colonoscopy:  PAP:  Bone density:  Lipid panel:  No Known Allergies  Current Outpatient Prescriptions  Medication Sig Dispense Refill  . ALPRAZolam (XANAX) 1 MG tablet Take 1 mg by mouth as needed for sleep.    Marland Kitchen amLODipine (NORVASC) 10 MG tablet Take 10 mg by mouth daily.     . colchicine 0.6 MG tablet Take 0.6 mg by mouth every  other day.     . gabapentin (NEURONTIN) 100 MG capsule Take 100 mg by mouth 3 (three) times daily.     Marland Kitchen LEVEMIR FLEXTOUCH 100 UNIT/ML Pen Inject 30 Units into the skin daily at 10 pm.     . losartan (COZAAR) 25 MG tablet Take 50 mg by mouth daily.     Marland Kitchen lovastatin (MEVACOR) 20 MG tablet Take 20 mg by mouth daily.     . Nebivolol HCl 20 MG TABS Take 20 mg by mouth daily.    . tamsulosin (FLOMAX) 0.4 MG CAPS capsule Take 0.4 mg by mouth daily.      No current facility-administered medications for this visit.      OBJECTIVE: Vitals:   08/13/16 1108  BP: 136/83  Pulse: 67  Resp: 20  Temp: (!) 96.6 F (35.9 C)     Body mass index is 24.91 kg/m.    ECOG FS:0 - Asymptomatic  General: Well-developed, well-nourished, no acute distress. Eyes: Pink conjunctiva, anicteric sclera. Lungs: Clear to auscultation bilaterally. Heart: Regular rate and rhythm. No rubs, murmurs, or gallops. Abdomen: Soft, nontender, nondistended. No organomegaly noted, normoactive bowel sounds. Musculoskeletal: No edema, cyanosis, or clubbing. Neuro: Alert, answering all questions appropriately. Cranial nerves grossly intact. Skin: No rashes or petechiae noted. Psych: Normal affect.    LAB RESULTS:  Lab Results  Component Value Date   NA 132 (L) 08/13/2016   K 4.6 08/13/2016   CL 99 (L) 08/13/2016   CO2 26 08/13/2016   GLUCOSE 311 (H) 08/13/2016   BUN 19 08/13/2016   CREATININE 1.52 (H) 08/13/2016   CALCIUM 9.2 08/13/2016   PROT 7.5 08/13/2016   ALBUMIN 3.6 08/13/2016   AST 36 08/13/2016   ALT 21 08/13/2016   ALKPHOS 251 (H) 08/13/2016   BILITOT 1.8 (H) 08/13/2016   GFRNONAA 48 (L) 08/13/2016   GFRAA 56 (L) 08/13/2016    Lab Results  Component Value Date   WBC 2.3 (L) 08/13/2016   NEUTROABS 1.4 08/13/2016   HGB 10.2 (L) 08/13/2016   HCT 30.1 (L) 08/13/2016   MCV 91.8 08/13/2016   PLT 103 (L) 08/13/2016     STUDIES: No results found.   Oncology history: Rodney Stewart underwent left nephrectomy on May 16, 2012 which revealed a clear cell grade 2 renal cell carcinoma, stage TIIIa, N0, M0. Tumor size of 16 cm. Rodney Stewart was noted to have renal vein involvement, but no other structures were involved. 0 of 2 lymph nodes were negative for disease. PET scan on October 20, 2015 revealed metastatic disease and Rodney Stewart was initiated on Votrient. This was subsequently discontinued in March 2018 secondary to progression of disease. Rodney Stewart initiated second line treatment with nivolumab on Monday, April 02, 2016  ASSESSMENT: Stage IV left renal cell carcinoma with metastasis to the lungs  PLAN:    1. Stage IV left renal cell carcinoma with metastasis to the lungs:  PET scan results from July 09, 2016 reviewed independently with improvement of disease burden. Proceed with cycle 10 of nivolumab today. Rodney Stewart will receive a flat dose of 240 mg every 2 weeks until intolerable side effects or progression of disease. Return to clinic in 2 weeks for nivolumab only and then in 4 weeks for consideration of cycle 12 and further evaluation. Plan to reimage in approximately October 2018. 2. Pancytopenia: Stable, monitor.  3. Renal insufficiency: Rodney Stewart's creatinine has trended back down to his baseline. Monitor. 4. Peripheral neuropathy: Likely secondary to diabetes, continue current dose of gabapentin. Monitor.  5. Hyperbilirubinemia: Unclear etiology, stable. Monitor.   Rodney Stewart expressed understanding and was in agreement with this plan. He also understands that He can call clinic at any time with any questions, concerns, or complaints.   Cancer Staging Metastatic renal cell carcinoma to lung [facility]) Staging form: Kidney, AJCC 7th Edition - Clinical stage from 10/12/2015: Stage IV (TX, N0, M1) - Signed by Lloyd Huger, MD on 10/12/2015   Lloyd Huger, MD 08/13/16 1:47 PM

## 2016-08-13 ENCOUNTER — Inpatient Hospital Stay: Payer: Medicare HMO

## 2016-08-13 ENCOUNTER — Inpatient Hospital Stay: Payer: Medicare HMO | Attending: Oncology | Admitting: Oncology

## 2016-08-13 VITALS — BP 136/83 | HR 67 | Temp 96.6°F | Resp 20 | Wt 178.6 lb

## 2016-08-13 DIAGNOSIS — K7460 Unspecified cirrhosis of liver: Secondary | ICD-10-CM | POA: Insufficient documentation

## 2016-08-13 DIAGNOSIS — D61818 Other pancytopenia: Secondary | ICD-10-CM | POA: Insufficient documentation

## 2016-08-13 DIAGNOSIS — K8020 Calculus of gallbladder without cholecystitis without obstruction: Secondary | ICD-10-CM | POA: Diagnosis not present

## 2016-08-13 DIAGNOSIS — Z905 Acquired absence of kidney: Secondary | ICD-10-CM | POA: Insufficient documentation

## 2016-08-13 DIAGNOSIS — N183 Chronic kidney disease, stage 3 (moderate): Secondary | ICD-10-CM

## 2016-08-13 DIAGNOSIS — C7802 Secondary malignant neoplasm of left lung: Secondary | ICD-10-CM

## 2016-08-13 DIAGNOSIS — R161 Splenomegaly, not elsewhere classified: Secondary | ICD-10-CM | POA: Diagnosis not present

## 2016-08-13 DIAGNOSIS — R188 Other ascites: Secondary | ICD-10-CM | POA: Insufficient documentation

## 2016-08-13 DIAGNOSIS — Z5112 Encounter for antineoplastic immunotherapy: Secondary | ICD-10-CM | POA: Diagnosis present

## 2016-08-13 DIAGNOSIS — C7801 Secondary malignant neoplasm of right lung: Secondary | ICD-10-CM | POA: Diagnosis not present

## 2016-08-13 DIAGNOSIS — K219 Gastro-esophageal reflux disease without esophagitis: Secondary | ICD-10-CM | POA: Diagnosis not present

## 2016-08-13 DIAGNOSIS — N400 Enlarged prostate without lower urinary tract symptoms: Secondary | ICD-10-CM | POA: Insufficient documentation

## 2016-08-13 DIAGNOSIS — M109 Gout, unspecified: Secondary | ICD-10-CM | POA: Insufficient documentation

## 2016-08-13 DIAGNOSIS — C642 Malignant neoplasm of left kidney, except renal pelvis: Secondary | ICD-10-CM | POA: Diagnosis not present

## 2016-08-13 DIAGNOSIS — R0602 Shortness of breath: Secondary | ICD-10-CM | POA: Insufficient documentation

## 2016-08-13 DIAGNOSIS — C649 Malignant neoplasm of unspecified kidney, except renal pelvis: Secondary | ICD-10-CM

## 2016-08-13 DIAGNOSIS — G629 Polyneuropathy, unspecified: Secondary | ICD-10-CM | POA: Diagnosis not present

## 2016-08-13 DIAGNOSIS — I129 Hypertensive chronic kidney disease with stage 1 through stage 4 chronic kidney disease, or unspecified chronic kidney disease: Secondary | ICD-10-CM | POA: Diagnosis not present

## 2016-08-13 DIAGNOSIS — I700 Atherosclerosis of aorta: Secondary | ICD-10-CM | POA: Insufficient documentation

## 2016-08-13 DIAGNOSIS — Z79899 Other long term (current) drug therapy: Secondary | ICD-10-CM

## 2016-08-13 DIAGNOSIS — Z794 Long term (current) use of insulin: Secondary | ICD-10-CM | POA: Diagnosis not present

## 2016-08-13 LAB — COMPREHENSIVE METABOLIC PANEL
ALBUMIN: 3.6 g/dL (ref 3.5–5.0)
ALT: 21 U/L (ref 17–63)
AST: 36 U/L (ref 15–41)
Alkaline Phosphatase: 251 U/L — ABNORMAL HIGH (ref 38–126)
Anion gap: 7 (ref 5–15)
BUN: 19 mg/dL (ref 6–20)
CHLORIDE: 99 mmol/L — AB (ref 101–111)
CO2: 26 mmol/L (ref 22–32)
Calcium: 9.2 mg/dL (ref 8.9–10.3)
Creatinine, Ser: 1.52 mg/dL — ABNORMAL HIGH (ref 0.61–1.24)
GFR calc Af Amer: 56 mL/min — ABNORMAL LOW (ref >60)
GFR, EST NON AFRICAN AMERICAN: 48 mL/min — AB (ref >60)
Glucose, Bld: 311 mg/dL — ABNORMAL HIGH (ref 65–99)
POTASSIUM: 4.6 mmol/L (ref 3.5–5.1)
SODIUM: 132 mmol/L — AB (ref 135–145)
Total Bilirubin: 1.8 mg/dL — ABNORMAL HIGH (ref 0.3–1.2)
Total Protein: 7.5 g/dL (ref 6.5–8.1)

## 2016-08-13 LAB — CBC WITH DIFFERENTIAL/PLATELET
BASOS ABS: 0 10*3/uL (ref 0–0.1)
BASOS PCT: 1 %
EOS ABS: 0.2 10*3/uL (ref 0–0.7)
EOS PCT: 7 %
HCT: 30.1 % — ABNORMAL LOW (ref 40.0–52.0)
Hemoglobin: 10.2 g/dL — ABNORMAL LOW (ref 13.0–18.0)
Lymphocytes Relative: 19 %
Lymphs Abs: 0.4 10*3/uL — ABNORMAL LOW (ref 1.0–3.6)
MCH: 31.1 pg (ref 26.0–34.0)
MCHC: 33.9 g/dL (ref 32.0–36.0)
MCV: 91.8 fL (ref 80.0–100.0)
MONO ABS: 0.3 10*3/uL (ref 0.2–1.0)
Monocytes Relative: 11 %
Neutro Abs: 1.4 10*3/uL (ref 1.4–6.5)
Neutrophils Relative %: 62 %
PLATELETS: 103 10*3/uL — AB (ref 150–440)
RBC: 3.28 MIL/uL — AB (ref 4.40–5.90)
RDW: 13.4 % (ref 11.5–14.5)
WBC: 2.3 10*3/uL — AB (ref 3.8–10.6)

## 2016-08-13 MED ORDER — NIVOLUMAB CHEMO INJECTION 100 MG/10ML
240.0000 mg | Freq: Once | INTRAVENOUS | Status: AC
  Administered 2016-08-13: 240 mg via INTRAVENOUS
  Filled 2016-08-13: qty 24

## 2016-08-13 MED ORDER — SODIUM CHLORIDE 0.9 % IV SOLN
Freq: Once | INTRAVENOUS | Status: AC
  Administered 2016-08-13: 12:00:00 via INTRAVENOUS
  Filled 2016-08-13: qty 1000

## 2016-08-13 NOTE — Progress Notes (Signed)
Patient denies any concerns today.  

## 2016-08-14 LAB — THYROID PANEL WITH TSH
Free Thyroxine Index: 2.2 (ref 1.2–4.9)
T3 UPTAKE RATIO: 29 % (ref 24–39)
T4, Total: 7.6 ug/dL (ref 4.5–12.0)
TSH: 6.97 u[IU]/mL — ABNORMAL HIGH (ref 0.450–4.500)

## 2016-08-27 ENCOUNTER — Inpatient Hospital Stay: Payer: Medicare HMO

## 2016-08-27 VITALS — BP 129/74 | HR 71 | Temp 98.8°F | Resp 18

## 2016-08-27 DIAGNOSIS — C649 Malignant neoplasm of unspecified kidney, except renal pelvis: Secondary | ICD-10-CM

## 2016-08-27 DIAGNOSIS — Z5112 Encounter for antineoplastic immunotherapy: Secondary | ICD-10-CM | POA: Diagnosis not present

## 2016-08-27 DIAGNOSIS — C7802 Secondary malignant neoplasm of left lung: Principal | ICD-10-CM

## 2016-08-27 LAB — COMPREHENSIVE METABOLIC PANEL
ALT: 23 U/L (ref 17–63)
AST: 41 U/L (ref 15–41)
Albumin: 3.4 g/dL — ABNORMAL LOW (ref 3.5–5.0)
Alkaline Phosphatase: 243 U/L — ABNORMAL HIGH (ref 38–126)
Anion gap: 6 (ref 5–15)
BUN: 23 mg/dL — ABNORMAL HIGH (ref 6–20)
CHLORIDE: 98 mmol/L — AB (ref 101–111)
CO2: 23 mmol/L (ref 22–32)
CREATININE: 1.68 mg/dL — AB (ref 0.61–1.24)
Calcium: 8.4 mg/dL — ABNORMAL LOW (ref 8.9–10.3)
GFR, EST AFRICAN AMERICAN: 49 mL/min — AB (ref >60)
GFR, EST NON AFRICAN AMERICAN: 43 mL/min — AB (ref >60)
Glucose, Bld: 424 mg/dL — ABNORMAL HIGH (ref 65–99)
POTASSIUM: 5.3 mmol/L — AB (ref 3.5–5.1)
SODIUM: 127 mmol/L — AB (ref 135–145)
Total Bilirubin: 1.3 mg/dL — ABNORMAL HIGH (ref 0.3–1.2)
Total Protein: 7.1 g/dL (ref 6.5–8.1)

## 2016-08-27 LAB — CBC WITH DIFFERENTIAL/PLATELET
Basophils Absolute: 0 10*3/uL (ref 0–0.1)
Basophils Relative: 1 %
EOS ABS: 0.1 10*3/uL (ref 0–0.7)
EOS PCT: 6 %
HCT: 26.8 % — ABNORMAL LOW (ref 40.0–52.0)
Hemoglobin: 8.9 g/dL — ABNORMAL LOW (ref 13.0–18.0)
LYMPHS ABS: 0.5 10*3/uL — AB (ref 1.0–3.6)
Lymphocytes Relative: 21 %
MCH: 29.2 pg (ref 26.0–34.0)
MCHC: 33.2 g/dL (ref 32.0–36.0)
MCV: 87.7 fL (ref 80.0–100.0)
MONO ABS: 0.3 10*3/uL (ref 0.2–1.0)
Monocytes Relative: 13 %
Neutro Abs: 1.3 10*3/uL — ABNORMAL LOW (ref 1.4–6.5)
Neutrophils Relative %: 59 %
PLATELETS: 109 10*3/uL — AB (ref 150–440)
RBC: 3.06 MIL/uL — AB (ref 4.40–5.90)
RDW: 13.6 % (ref 11.5–14.5)
WBC: 2.2 10*3/uL — AB (ref 3.8–10.6)

## 2016-08-27 MED ORDER — NIVOLUMAB CHEMO INJECTION 100 MG/10ML
240.0000 mg | Freq: Once | INTRAVENOUS | Status: AC
  Administered 2016-08-27: 240 mg via INTRAVENOUS
  Filled 2016-08-27: qty 24

## 2016-08-27 MED ORDER — SODIUM CHLORIDE 0.9 % IV SOLN
Freq: Once | INTRAVENOUS | Status: AC
  Administered 2016-08-27: 14:00:00 via INTRAVENOUS
  Filled 2016-08-27: qty 1000

## 2016-08-27 NOTE — Progress Notes (Signed)
Potassium: 5.3. MD, Dr. Grayland Ormond, notified via telephone. Per MD order: proceed with scheduled treatment today.  Glucose: 424. Patient states, "My sugar has been irregular for the last three weeks. I go to a doctor in [place] and have been meaning to contact them about it." MD, Dr. Grayland Ormond, notified via telephone. Per MD order: proceed with scheduled treatment today and instruct patient to contact primary care physician at [facility] to follow up on blood sugar. Patient informed to contact primary care physician and verbalized understanding.

## 2016-08-28 DIAGNOSIS — E113391 Type 2 diabetes mellitus with moderate nonproliferative diabetic retinopathy without macular edema, right eye: Secondary | ICD-10-CM | POA: Diagnosis not present

## 2016-08-28 DIAGNOSIS — I120 Hypertensive chronic kidney disease with stage 5 chronic kidney disease or end stage renal disease: Secondary | ICD-10-CM | POA: Diagnosis not present

## 2016-08-28 DIAGNOSIS — N183 Chronic kidney disease, stage 3 (moderate): Secondary | ICD-10-CM | POA: Diagnosis not present

## 2016-08-28 DIAGNOSIS — Z6825 Body mass index (BMI) 25.0-25.9, adult: Secondary | ICD-10-CM | POA: Diagnosis not present

## 2016-08-28 DIAGNOSIS — Z794 Long term (current) use of insulin: Secondary | ICD-10-CM | POA: Diagnosis not present

## 2016-08-28 DIAGNOSIS — E1122 Type 2 diabetes mellitus with diabetic chronic kidney disease: Secondary | ICD-10-CM | POA: Diagnosis not present

## 2016-08-28 DIAGNOSIS — M7022 Olecranon bursitis, left elbow: Secondary | ICD-10-CM | POA: Diagnosis not present

## 2016-08-28 DIAGNOSIS — R69 Illness, unspecified: Secondary | ICD-10-CM | POA: Diagnosis not present

## 2016-08-28 DIAGNOSIS — F1010 Alcohol abuse, uncomplicated: Secondary | ICD-10-CM | POA: Insufficient documentation

## 2016-08-28 DIAGNOSIS — N179 Acute kidney failure, unspecified: Secondary | ICD-10-CM | POA: Diagnosis not present

## 2016-08-28 DIAGNOSIS — I10 Essential (primary) hypertension: Secondary | ICD-10-CM | POA: Diagnosis not present

## 2016-08-28 LAB — THYROID PANEL WITH TSH
FREE THYROXINE INDEX: 2 (ref 1.2–4.9)
T3 UPTAKE RATIO: 29 % (ref 24–39)
T4, Total: 6.9 ug/dL (ref 4.5–12.0)
TSH: 4.8 u[IU]/mL — ABNORMAL HIGH (ref 0.450–4.500)

## 2016-09-07 NOTE — Progress Notes (Signed)
[place]  Telephone:(336) [phone] [phone]) [phone]  ID: Rodney Stewart OB: 1955/03/06  MR#: [id number]  EVO#:[id number]  Patient Care Team: Patient, No Pcp Per as PCP - General (General Practice)  CHIEF COMPLAINT: Stage IV renal cell carcinoma with bilateral lung metastases.  INTERVAL HISTORY: Patient returns to clinic today for further evaluation and consideration of cycle 12 of nivolumab. He is tolerating his treatments well without significant side effects. He continues to have chronic peripheral neuropathy in both feet, but has no other neurologic complaints. He recently saw his primary care physician for his worsening hyperglycemia and now has improved control. He denies any fevers, chills, or recent illnesses. He has a good appetite and denies weight loss. He denies any chest pain, shortness of breath, cough, or hemoptysis. He has no nausea, vomiting, constipation, or diarrhea. He has no melena or hematochezia. He has no urinary complaints. Patient offers no further specific complaints today.   REVIEW OF SYSTEMS:   Review of Systems  Constitutional: Negative.  Negative for fever, malaise/fatigue and weight loss.  Eyes: Negative for blurred vision.  Respiratory: Negative for cough and shortness of breath.   Cardiovascular: Negative.  Negative for chest pain and leg swelling.  Gastrointestinal: Negative.  Negative for abdominal pain, constipation, diarrhea, nausea and vomiting.  Genitourinary: Negative.   Musculoskeletal: Negative.   Neurological: Positive for tingling and sensory change. Negative for weakness and headaches.  Psychiatric/Behavioral: Negative.  The patient is not nervous/anxious and does not have insomnia.    As per HPI. Otherwise, a complete review of systems is negative.   PAST MEDICAL HISTORY: Past Medical History:  Diagnosis Date  . Anemia   . BPH (benign prostatic hyperplasia)   . CKD (chronic kidney disease) stage 3, GFR 30-59 ml/min     . Clear cell renal cell carcinoma (HCC)   . Diabetes mellitus without complication ([place])   . Dyspnea    with exertion  . GERD (gastroesophageal reflux disease)   . History of gout   . History of nephrectomy    Left  . Hypertension   . Neuropathy   . Renal cell carcinoma of left kidney (HCC)    mets to lungs  . Umbilical hernia     PAST SURGICAL HISTORY: Past Surgical History:  Procedure Laterality Date  . BACK SURGERY    . ENDOBRONCHIAL ULTRASOUND N/A 10/14/2015   Procedure: ENDOBRONCHIAL ULTRASOUND;  Surgeon: [name], MD;  Location: [facility] ORS;  Service: Pulmonary;  Laterality: N/A;  . EYE SURGERY Bilateral    Cataract Extraction with IOL  . KNEE SURGERY Right   . NEPHRECTOMY      FAMILY HISTORY: Family History  Problem Relation Age of Onset  . Ovarian cancer Mother   . Diabetes Father   . Hypertension Father     ADVANCED DIRECTIVES (Y/N):  N  HEALTH MAINTENANCE: Social History  Substance Use Topics  . Smoking status: Former Smoker    Packs/day: 1.00    Types: Cigarettes  . Smokeless tobacco: Never Used  . Alcohol use 25.2 oz/week    42 Cans of beer per week     Comment: 4-5 cans of beer a day     Colonoscopy:  PAP:  Bone density:  Lipid panel:  No Known Allergies  Current Outpatient Prescriptions  Medication Sig Dispense Refill  . ALPRAZolam (XANAX) 1 MG tablet Take 1 mg by mouth as needed for sleep.    Marland Kitchen amLODipine (NORVASC) 10 MG tablet Take 10 mg by mouth  daily.     . colchicine 0.6 MG tablet Take 0.6 mg by mouth every other day.     . gabapentin (NEURONTIN) 600 MG tablet Take 600 mg by mouth daily.    . insulin aspart (NOVOLOG) 100 UNIT/ML FlexPen Inject 5 Units into the skin.     . Insulin Detemir (LEVEMIR FLEXTOUCH) 100 UNIT/ML Pen Inject 45 Units into the skin.     Marland Kitchen losartan (COZAAR) 25 MG tablet Take 50 mg by mouth daily.     Marland Kitchen lovastatin (MEVACOR) 20 MG tablet Take 20 mg by mouth daily.     . Nebivolol HCl 20 MG TABS Take 20  mg by mouth daily.    . tamsulosin (FLOMAX) 0.4 MG CAPS capsule Take 0.4 mg by mouth daily.      No current facility-administered medications for this visit.     OBJECTIVE: Vitals:   09/10/16 1032  BP: 118/75  Pulse: 63  Resp: 18  Temp: (!) 97.5 F (36.4 C)     Body mass index is 24.66 kg/m.    ECOG FS:0 - Asymptomatic  General: Well-developed, well-nourished, no acute distress. Eyes: Pink conjunctiva, anicteric sclera. Lungs: Clear to auscultation bilaterally. Heart: Regular rate and rhythm. No rubs, murmurs, or gallops. Abdomen: Soft, nontender, nondistended. No organomegaly noted, normoactive bowel sounds. Musculoskeletal: No edema, cyanosis, or clubbing. Neuro: Alert, answering all questions appropriately. Cranial nerves grossly intact. Skin: No rashes or petechiae noted. Psych: Normal affect.      LAB RESULTS:  Lab Results  Component Value Date   NA 130 (L) 09/10/2016   K 4.6 09/10/2016   CL 100 (L) 09/10/2016   CO2 21 (L) 09/10/2016   GLUCOSE 213 (H) 09/10/2016   BUN 22 (H) 09/10/2016   CREATININE 1.88 (H) 09/10/2016   CALCIUM 8.3 (L) 09/10/2016   PROT 7.2 09/10/2016   ALBUMIN 3.5 09/10/2016   AST 52 (H) 09/10/2016   ALT 23 09/10/2016   ALKPHOS 267 (H) 09/10/2016   BILITOT 1.3 (H) 09/10/2016   GFRNONAA 37 (L) 09/10/2016   GFRAA 43 (L) 09/10/2016    Lab Results  Component Value Date   WBC 2.4 (L) 09/10/2016   NEUTROABS 1.2 (L) 09/10/2016   HGB 9.3 (L) 09/10/2016   HCT 28.2 (L) 09/10/2016   MCV 85.0 09/10/2016   PLT 101 (L) 09/10/2016     STUDIES: No results found.   Oncology history: Patient underwent left nephrectomy on May 16, 2012 which revealed a clear cell grade 2 renal cell carcinoma, stage TIIIa, N0, M0. Tumor size of 16 cm. Patient was noted to have renal vein involvement, but no other structures were involved. 0 of 2 lymph nodes were negative for disease. PET scan on October 20, 2015 revealed metastatic disease and patient was initiated  on Votrient. This was subsequently discontinued in March 2018 secondary to progression of disease. Patient initiated second line treatment with nivolumab on Monday, April 02, 2016  ASSESSMENT: Stage IV left renal cell carcinoma with metastasis to the lungs  PLAN:    1. Stage IV left renal cell carcinoma with metastasis to the lungs:  PET scan results from July 09, 2016 reviewed independently with improvement of disease burden. Proceed with cycle 12 of nivolumab today. Patient will receive a flat dose of 240 mg every 2 weeks until intolerable side effects or progression of disease. Return to clinic in 2 weeks for nivolumab only and then in 4 weeks for consideration of cycle 12 and further evaluation. Plan to reimage in  approximately October 2018. 2. Pancytopenia: Stable, monitor.  3. Renal insufficiency: Patient's creatinine is slightly above his baseline at 1.88. Continue to monitor closely.  4. Peripheral neuropathy: Likely secondary to diabetes, continue current dose of gabapentin. Monitor. 5. Hyperbilirubinemia: Unclear etiology, stable. Monitor. 6. Hyperglycemia: Improved. Patient recently had a visit with his primary care physician who adjusted his medications. Continue to monitor closely.  Patient expressed understanding and was in agreement with this plan. He also understands that He can call clinic at any time with any questions, concerns, or complaints.   Cancer Staging Metastatic renal cell carcinoma to lung [facility]) Staging form: Kidney, AJCC 7th Edition - Clinical stage from 10/12/2015: Stage IV (TX, N0, M1) - Signed by Lloyd Huger, MD on 10/12/2015   Lloyd Huger, MD 09/10/16 10:53 AM

## 2016-09-10 ENCOUNTER — Inpatient Hospital Stay: Payer: Medicare HMO | Attending: Oncology | Admitting: Oncology

## 2016-09-10 ENCOUNTER — Inpatient Hospital Stay: Payer: Medicare HMO

## 2016-09-10 VITALS — BP 118/75 | HR 63 | Temp 97.5°F | Resp 18 | Wt 176.8 lb

## 2016-09-10 DIAGNOSIS — Z8041 Family history of malignant neoplasm of ovary: Secondary | ICD-10-CM

## 2016-09-10 DIAGNOSIS — C7802 Secondary malignant neoplasm of left lung: Principal | ICD-10-CM

## 2016-09-10 DIAGNOSIS — Z79899 Other long term (current) drug therapy: Secondary | ICD-10-CM | POA: Insufficient documentation

## 2016-09-10 DIAGNOSIS — E1165 Type 2 diabetes mellitus with hyperglycemia: Secondary | ICD-10-CM | POA: Insufficient documentation

## 2016-09-10 DIAGNOSIS — K219 Gastro-esophageal reflux disease without esophagitis: Secondary | ICD-10-CM | POA: Insufficient documentation

## 2016-09-10 DIAGNOSIS — R0602 Shortness of breath: Secondary | ICD-10-CM

## 2016-09-10 DIAGNOSIS — Z87891 Personal history of nicotine dependence: Secondary | ICD-10-CM | POA: Diagnosis not present

## 2016-09-10 DIAGNOSIS — I129 Hypertensive chronic kidney disease with stage 1 through stage 4 chronic kidney disease, or unspecified chronic kidney disease: Secondary | ICD-10-CM | POA: Diagnosis not present

## 2016-09-10 DIAGNOSIS — N183 Chronic kidney disease, stage 3 (moderate): Secondary | ICD-10-CM

## 2016-09-10 DIAGNOSIS — G629 Polyneuropathy, unspecified: Secondary | ICD-10-CM | POA: Diagnosis not present

## 2016-09-10 DIAGNOSIS — N400 Enlarged prostate without lower urinary tract symptoms: Secondary | ICD-10-CM | POA: Insufficient documentation

## 2016-09-10 DIAGNOSIS — C649 Malignant neoplasm of unspecified kidney, except renal pelvis: Secondary | ICD-10-CM

## 2016-09-10 DIAGNOSIS — Z5112 Encounter for antineoplastic immunotherapy: Secondary | ICD-10-CM | POA: Insufficient documentation

## 2016-09-10 DIAGNOSIS — M109 Gout, unspecified: Secondary | ICD-10-CM | POA: Diagnosis not present

## 2016-09-10 DIAGNOSIS — Z794 Long term (current) use of insulin: Secondary | ICD-10-CM | POA: Insufficient documentation

## 2016-09-10 DIAGNOSIS — D61818 Other pancytopenia: Secondary | ICD-10-CM | POA: Diagnosis not present

## 2016-09-10 LAB — CBC WITH DIFFERENTIAL/PLATELET
BASOS PCT: 1 %
Basophils Absolute: 0 10*3/uL (ref 0–0.1)
Eosinophils Absolute: 0.2 10*3/uL (ref 0–0.7)
Eosinophils Relative: 9 %
HEMATOCRIT: 28.2 % — AB (ref 40.0–52.0)
Hemoglobin: 9.3 g/dL — ABNORMAL LOW (ref 13.0–18.0)
LYMPHS ABS: 0.5 10*3/uL — AB (ref 1.0–3.6)
LYMPHS PCT: 21 %
MCH: 27.9 pg (ref 26.0–34.0)
MCHC: 32.9 g/dL (ref 32.0–36.0)
MCV: 85 fL (ref 80.0–100.0)
MONO ABS: 0.4 10*3/uL (ref 0.2–1.0)
MONOS PCT: 17 %
NEUTROS ABS: 1.2 10*3/uL — AB (ref 1.4–6.5)
Neutrophils Relative %: 52 %
Platelets: 101 10*3/uL — ABNORMAL LOW (ref 150–440)
RBC: 3.32 MIL/uL — ABNORMAL LOW (ref 4.40–5.90)
RDW: 15 % — AB (ref 11.5–14.5)
WBC: 2.4 10*3/uL — ABNORMAL LOW (ref 3.8–10.6)

## 2016-09-10 LAB — COMPREHENSIVE METABOLIC PANEL
ALK PHOS: 267 U/L — AB (ref 38–126)
ALT: 23 U/L (ref 17–63)
ANION GAP: 9 (ref 5–15)
AST: 52 U/L — ABNORMAL HIGH (ref 15–41)
Albumin: 3.5 g/dL (ref 3.5–5.0)
BILIRUBIN TOTAL: 1.3 mg/dL — AB (ref 0.3–1.2)
BUN: 22 mg/dL — ABNORMAL HIGH (ref 6–20)
CALCIUM: 8.3 mg/dL — AB (ref 8.9–10.3)
CO2: 21 mmol/L — ABNORMAL LOW (ref 22–32)
Chloride: 100 mmol/L — ABNORMAL LOW (ref 101–111)
Creatinine, Ser: 1.88 mg/dL — ABNORMAL HIGH (ref 0.61–1.24)
GFR calc Af Amer: 43 mL/min — ABNORMAL LOW (ref >60)
GFR, EST NON AFRICAN AMERICAN: 37 mL/min — AB (ref >60)
Glucose, Bld: 213 mg/dL — ABNORMAL HIGH (ref 65–99)
POTASSIUM: 4.6 mmol/L (ref 3.5–5.1)
Sodium: 130 mmol/L — ABNORMAL LOW (ref 135–145)
TOTAL PROTEIN: 7.2 g/dL (ref 6.5–8.1)

## 2016-09-10 MED ORDER — SODIUM CHLORIDE 0.9 % IV SOLN
240.0000 mg | Freq: Once | INTRAVENOUS | Status: AC
  Administered 2016-09-10: 240 mg via INTRAVENOUS
  Filled 2016-09-10: qty 24

## 2016-09-10 MED ORDER — SODIUM CHLORIDE 0.9 % IV SOLN
Freq: Once | INTRAVENOUS | Status: AC
  Administered 2016-09-10: 11:00:00 via INTRAVENOUS
  Filled 2016-09-10: qty 1000

## 2016-09-10 NOTE — Addendum Note (Signed)
Addended by: Luretha Murphy on: 09/10/2016 11:19 AM   Modules accepted: Orders

## 2016-09-11 LAB — THYROID PANEL WITH TSH
FREE THYROXINE INDEX: 2.3 (ref 1.2–4.9)
T3 Uptake Ratio: 30 % (ref 24–39)
T4, Total: 7.8 ug/dL (ref 4.5–12.0)
TSH: 10.76 u[IU]/mL — ABNORMAL HIGH (ref 0.450–4.500)

## 2016-09-24 ENCOUNTER — Other Ambulatory Visit: Payer: Self-pay | Admitting: Oncology

## 2016-09-24 ENCOUNTER — Inpatient Hospital Stay: Payer: Medicare HMO

## 2016-09-24 VITALS — BP 118/73 | HR 66 | Temp 98.2°F | Resp 18 | Wt 180.6 lb

## 2016-09-24 DIAGNOSIS — C7802 Secondary malignant neoplasm of left lung: Principal | ICD-10-CM

## 2016-09-24 DIAGNOSIS — N400 Enlarged prostate without lower urinary tract symptoms: Secondary | ICD-10-CM | POA: Diagnosis not present

## 2016-09-24 DIAGNOSIS — G629 Polyneuropathy, unspecified: Secondary | ICD-10-CM | POA: Diagnosis not present

## 2016-09-24 DIAGNOSIS — I129 Hypertensive chronic kidney disease with stage 1 through stage 4 chronic kidney disease, or unspecified chronic kidney disease: Secondary | ICD-10-CM | POA: Diagnosis not present

## 2016-09-24 DIAGNOSIS — C649 Malignant neoplasm of unspecified kidney, except renal pelvis: Secondary | ICD-10-CM

## 2016-09-24 DIAGNOSIS — E1165 Type 2 diabetes mellitus with hyperglycemia: Secondary | ICD-10-CM | POA: Diagnosis not present

## 2016-09-24 DIAGNOSIS — Z5112 Encounter for antineoplastic immunotherapy: Secondary | ICD-10-CM | POA: Diagnosis not present

## 2016-09-24 DIAGNOSIS — N183 Chronic kidney disease, stage 3 (moderate): Secondary | ICD-10-CM | POA: Diagnosis not present

## 2016-09-24 DIAGNOSIS — D61818 Other pancytopenia: Secondary | ICD-10-CM | POA: Diagnosis not present

## 2016-09-24 LAB — CBC WITH DIFFERENTIAL/PLATELET
BASOS ABS: 0 10*3/uL (ref 0–0.1)
Basophils Relative: 1 %
EOS ABS: 0.2 10*3/uL (ref 0–0.7)
Eosinophils Relative: 7 %
HEMATOCRIT: 27.2 % — AB (ref 40.0–52.0)
Hemoglobin: 9 g/dL — ABNORMAL LOW (ref 13.0–18.0)
Lymphocytes Relative: 21 %
Lymphs Abs: 0.6 10*3/uL — ABNORMAL LOW (ref 1.0–3.6)
MCH: 27.3 pg (ref 26.0–34.0)
MCHC: 33 g/dL (ref 32.0–36.0)
MCV: 82.7 fL (ref 80.0–100.0)
Monocytes Absolute: 0.3 10*3/uL (ref 0.2–1.0)
Monocytes Relative: 11 %
NEUTROS PCT: 60 %
Neutro Abs: 1.7 10*3/uL (ref 1.4–6.5)
PLATELETS: 194 10*3/uL (ref 150–440)
RBC: 3.28 MIL/uL — AB (ref 4.40–5.90)
RDW: 15.3 % — ABNORMAL HIGH (ref 11.5–14.5)
WBC: 2.9 10*3/uL — AB (ref 3.8–10.6)

## 2016-09-24 LAB — COMPREHENSIVE METABOLIC PANEL
ALK PHOS: 417 U/L — AB (ref 38–126)
ALT: 26 U/L (ref 17–63)
AST: 54 U/L — ABNORMAL HIGH (ref 15–41)
Albumin: 3.3 g/dL — ABNORMAL LOW (ref 3.5–5.0)
Anion gap: 6 (ref 5–15)
BILIRUBIN TOTAL: 1.2 mg/dL (ref 0.3–1.2)
BUN: 19 mg/dL (ref 6–20)
CALCIUM: 9 mg/dL (ref 8.9–10.3)
CO2: 26 mmol/L (ref 22–32)
CREATININE: 1.5 mg/dL — AB (ref 0.61–1.24)
Chloride: 102 mmol/L (ref 101–111)
GFR, EST AFRICAN AMERICAN: 57 mL/min — AB (ref >60)
GFR, EST NON AFRICAN AMERICAN: 49 mL/min — AB (ref >60)
Glucose, Bld: 103 mg/dL — ABNORMAL HIGH (ref 65–99)
Potassium: 4.9 mmol/L (ref 3.5–5.1)
Sodium: 134 mmol/L — ABNORMAL LOW (ref 135–145)
TOTAL PROTEIN: 7.7 g/dL (ref 6.5–8.1)

## 2016-09-24 MED ORDER — SODIUM CHLORIDE 0.9 % IV SOLN
240.0000 mg | Freq: Once | INTRAVENOUS | Status: AC
  Administered 2016-09-24: 240 mg via INTRAVENOUS
  Filled 2016-09-24: qty 24

## 2016-09-24 MED ORDER — SODIUM CHLORIDE 0.9 % IV SOLN
Freq: Once | INTRAVENOUS | Status: AC
  Administered 2016-09-24: 12:00:00 via INTRAVENOUS
  Filled 2016-09-24: qty 1000

## 2016-09-25 LAB — THYROID PANEL WITH TSH
FREE THYROXINE INDEX: 2.5 (ref 1.2–4.9)
T3 Uptake Ratio: 29 % (ref 24–39)
T4, Total: 8.6 ug/dL (ref 4.5–12.0)
TSH: 10.92 u[IU]/mL — AB (ref 0.450–4.500)

## 2016-10-04 ENCOUNTER — Ambulatory Visit: Payer: Medicare HMO

## 2016-10-07 NOTE — Progress Notes (Signed)
Rodney Stewart  Telephone:(336) [phone] Fax:(336) [phone]  ID: Rodney Stewart OB: June 27, 1955  MR#: [id number]  AOZ#:[id number]  Patient Care Team: Patient, No Pcp Per as PCP - General (General Practice)  CHIEF COMPLAINT: Stage IV renal cell carcinoma with bilateral lung metastases.  INTERVAL HISTORY: Patient returns to clinic today for further evaluation and consideration of cycle 14 of nivolumab. He is tolerating his treatments well without significant side effects. He continues to have significant peripheral neuropathy in both feet, but has no other neurologic complaints. He continues to have poor blood glucose control. He denies any fevers, chills, or recent illnesses. He has a good appetite and denies weight loss. He denies any chest pain, shortness of breath, cough, or hemoptysis. He has no nausea, vomiting, constipation, or diarrhea. He has no melena or hematochezia. He has no urinary complaints. Patient offers no further specific complaints today.   REVIEW OF SYSTEMS:   Review of Systems  Constitutional: Negative.  Negative for fever, malaise/fatigue and weight loss.  Eyes: Negative for blurred vision.  Respiratory: Negative for cough and shortness of breath.   Cardiovascular: Negative.  Negative for chest pain and leg swelling.  Gastrointestinal: Negative.  Negative for abdominal pain, constipation, diarrhea, nausea and vomiting.  Genitourinary: Negative.   Musculoskeletal: Negative.   Neurological: Positive for tingling and sensory change. Negative for weakness and headaches.  Psychiatric/Behavioral: Negative.  The patient is not nervous/anxious and does not have insomnia.    As per HPI. Otherwise, a complete review of systems is negative.   PAST MEDICAL HISTORY: Past Medical History:  Diagnosis Date  . Anemia   . BPH (benign prostatic hyperplasia)   . CKD (chronic kidney disease) stage 3, GFR 30-59 ml/min   . Clear cell renal cell carcinoma (HCC)   .  Diabetes mellitus without complication ([place])   . Dyspnea    with exertion  . GERD (gastroesophageal reflux disease)   . History of gout   . History of nephrectomy    Left  . Hypertension   . Neuropathy   . Renal cell carcinoma of left kidney (HCC)    mets to lungs  . Umbilical hernia     PAST SURGICAL HISTORY: Past Surgical History:  Procedure Laterality Date  . BACK SURGERY    . ENDOBRONCHIAL ULTRASOUND N/A 10/14/2015   Procedure: ENDOBRONCHIAL ULTRASOUND;  Surgeon: [name], MD;  Location: [facility] ORS;  Service: Pulmonary;  Laterality: N/A;  . EYE SURGERY Bilateral    Cataract Extraction with IOL  . KNEE SURGERY Right   . NEPHRECTOMY      FAMILY HISTORY: Family History  Problem Relation Age of Onset  . Ovarian cancer Mother   . Diabetes Father   . Hypertension Father     ADVANCED DIRECTIVES (Y/N):  N  HEALTH MAINTENANCE: Social History  Substance Use Topics  . Smoking status: Former Smoker    Packs/day: 1.00    Types: Cigarettes  . Smokeless tobacco: Never Used  . Alcohol use 25.2 oz/week    42 Cans of beer per week     Comment: 4-5 cans of beer a day     Colonoscopy:  PAP:  Bone density:  Lipid panel:  No Known Allergies  Current Outpatient Prescriptions  Medication Sig Dispense Refill  . ALPRAZolam (XANAX) 1 MG tablet Take 1 mg by mouth as needed for sleep.    Marland Kitchen amLODipine (NORVASC) 10 MG tablet Take 10 mg by mouth daily.     . colchicine 0.6 MG  tablet Take 0.6 mg by mouth every other day.     . gabapentin (NEURONTIN) 600 MG tablet Take 600 mg by mouth daily.    . insulin aspart (NOVOLOG) 100 UNIT/ML FlexPen Inject 5 Units into the skin.     . Insulin Detemir (LEVEMIR FLEXTOUCH) 100 UNIT/ML Pen Inject 45 Units into the skin.     Marland Kitchen losartan (COZAAR) 25 MG tablet Take 50 mg by mouth daily.     Marland Kitchen lovastatin (MEVACOR) 20 MG tablet Take 20 mg by mouth daily.     . Nebivolol HCl 20 MG TABS Take 20 mg by mouth daily.    . ONE TOUCH ULTRA TEST  test strip     . tamsulosin (FLOMAX) 0.4 MG CAPS capsule Take 0.4 mg by mouth daily.      No current facility-administered medications for this visit.     OBJECTIVE: Vitals:   10/10/16 1001  BP: 129/75  Pulse: 69  Resp: 18  Temp: (!) 96.8 F (36 C)     Body mass index is 24.25 kg/m.    ECOG FS:0 - Asymptomatic  General: Well-developed, well-nourished, no acute distress. Eyes: Pink conjunctiva, anicteric sclera. Lungs: Clear to auscultation bilaterally. Heart: Regular rate and rhythm. No rubs, murmurs, or gallops. Abdomen: Soft, nontender, nondistended. No organomegaly noted, normoactive bowel sounds. Musculoskeletal: No edema, cyanosis, or clubbing. Neuro: Alert, answering all questions appropriately. Cranial nerves grossly intact. Skin: No rashes or petechiae noted. Psych: Normal affect.      LAB RESULTS:  Lab Results  Component Value Date   NA 132 (L) 10/10/2016   K 4.2 10/10/2016   CL 99 (L) 10/10/2016   CO2 23 10/10/2016   GLUCOSE 268 (H) 10/10/2016   BUN 26 (H) 10/10/2016   CREATININE 1.60 (H) 10/10/2016   CALCIUM 8.8 (L) 10/10/2016   PROT 7.1 10/10/2016   ALBUMIN 3.4 (L) 10/10/2016   AST 37 10/10/2016   ALT 20 10/10/2016   ALKPHOS 264 (H) 10/10/2016   BILITOT 1.2 10/10/2016   GFRNONAA 45 (L) 10/10/2016   GFRAA 52 (L) 10/10/2016    Lab Results  Component Value Date   WBC 1.9 (L) 10/10/2016   NEUTROABS 1.0 (L) 10/10/2016   HGB 7.4 (L) 10/10/2016   HCT 23.5 (L) 10/10/2016   MCV 81.1 10/10/2016   PLT 99 (L) 10/10/2016     STUDIES: No results found.   Oncology history: Patient underwent left nephrectomy on May 16, 2012 which revealed a clear cell grade 2 renal cell carcinoma, stage TIIIa, N0, M0. Tumor size of 16 cm. Patient was noted to have renal vein involvement, but no other structures were involved. 0 of 2 lymph nodes were negative for disease. PET scan on October 20, 2015 revealed metastatic disease and patient was initiated on Votrient. This  was subsequently discontinued in March 2018 secondary to progression of disease. Patient initiated second line treatment with nivolumab on Monday, April 02, 2016  ASSESSMENT: Stage IV left renal cell carcinoma with metastasis to the lungs  PLAN:    1. Stage IV left renal cell carcinoma with metastasis to the lungs:  PET scan results from July 09, 2016 reviewed independently with improvement of disease burden. Proceed with cycle 14 of nivolumab today. Patient will receive a flat dose of 240 mg every 2 weeks until intolerable side effects or progression of disease. Return to clinic in 2 weeks for consideration of cycle 15 and further evaluation. Patient has a PET scan scheduled for October 15, 2016.  2.  Pancytopenia: Patient's blood counts have declined slightly, consider bone marrow biopsy in the future to determine an etiology.   3. Renal insufficiency: Patient's creatinine is approximately at his baseline. Continue to monitor closely.  4. Peripheral neuropathy: Likely secondary to diabetes, continue current dose of gabapentin. Patient plans to discuss with his primary care adjusting his medications possibly pursuing Lyrica. Monitor. 5. Hyperbilirubinemia: Resolved. 6. Hyperglycemia: Improved. Patient recently had a visit with his primary care physician who adjusted his medications. Continue to monitor closely.  Patient expressed understanding and was in agreement with this plan. He also understands that He can call clinic at any time with any questions, concerns, or complaints.   Cancer Staging Metastatic renal cell carcinoma to lung [facility]) Staging form: Kidney, AJCC 7th Edition - Clinical stage from 10/12/2015: Stage IV (TX, N0, M1) - Signed by Lloyd Huger, MD on 10/12/2015   Lloyd Huger, MD 10/13/16 7:06 AM

## 2016-10-08 ENCOUNTER — Ambulatory Visit: Payer: Medicare HMO

## 2016-10-08 ENCOUNTER — Ambulatory Visit: Payer: Medicare HMO | Admitting: Oncology

## 2016-10-08 ENCOUNTER — Other Ambulatory Visit: Payer: Medicare HMO

## 2016-10-08 ENCOUNTER — Telehealth: Payer: Self-pay | Admitting: Oncology

## 2016-10-08 NOTE — Telephone Encounter (Signed)
°  Patient's PET is rescheduled for Monday 10/15/16 @ 10:30 a.m, per email from [facility] on 10/08/16.  [facility] letter mailed to patient with updated appt date. L/M on patient's vm.

## 2016-10-09 ENCOUNTER — Telehealth: Payer: Self-pay | Admitting: *Deleted

## 2016-10-09 ENCOUNTER — Other Ambulatory Visit: Payer: Self-pay | Admitting: Oncology

## 2016-10-09 ENCOUNTER — Ambulatory Visit: Payer: Medicare HMO

## 2016-10-09 DIAGNOSIS — R69 Illness, unspecified: Secondary | ICD-10-CM | POA: Diagnosis not present

## 2016-10-09 NOTE — Telephone Encounter (Signed)
Patient called to inquire if he should keep appointment for Wed or reschedule it because his PET scan has been moved again to 10/15/16. His appointment 10/10 is for lab/ doctor/ opdivo.  Please advise

## 2016-10-09 NOTE — Telephone Encounter (Signed)
Patient informed per VO Dr Grayland Ormond to keep appointment Wed as planned and still do PET Monday. He stated "OK, that's what I'll do"

## 2016-10-10 ENCOUNTER — Inpatient Hospital Stay: Payer: Medicare HMO

## 2016-10-10 ENCOUNTER — Inpatient Hospital Stay: Payer: Medicare HMO | Attending: Oncology

## 2016-10-10 ENCOUNTER — Inpatient Hospital Stay (HOSPITAL_BASED_OUTPATIENT_CLINIC_OR_DEPARTMENT_OTHER): Payer: Medicare HMO | Admitting: Oncology

## 2016-10-10 VITALS — BP 129/75 | HR 69 | Temp 96.8°F | Resp 18 | Wt 173.9 lb

## 2016-10-10 DIAGNOSIS — C649 Malignant neoplasm of unspecified kidney, except renal pelvis: Secondary | ICD-10-CM

## 2016-10-10 DIAGNOSIS — Z79899 Other long term (current) drug therapy: Secondary | ICD-10-CM | POA: Diagnosis not present

## 2016-10-10 DIAGNOSIS — C7802 Secondary malignant neoplasm of left lung: Secondary | ICD-10-CM

## 2016-10-10 DIAGNOSIS — G629 Polyneuropathy, unspecified: Secondary | ICD-10-CM | POA: Diagnosis not present

## 2016-10-10 DIAGNOSIS — E1140 Type 2 diabetes mellitus with diabetic neuropathy, unspecified: Secondary | ICD-10-CM

## 2016-10-10 DIAGNOSIS — K219 Gastro-esophageal reflux disease without esophagitis: Secondary | ICD-10-CM | POA: Insufficient documentation

## 2016-10-10 DIAGNOSIS — N183 Chronic kidney disease, stage 3 (moderate): Secondary | ICD-10-CM | POA: Diagnosis not present

## 2016-10-10 DIAGNOSIS — Z794 Long term (current) use of insulin: Secondary | ICD-10-CM | POA: Diagnosis not present

## 2016-10-10 DIAGNOSIS — N400 Enlarged prostate without lower urinary tract symptoms: Secondary | ICD-10-CM | POA: Diagnosis not present

## 2016-10-10 DIAGNOSIS — R0602 Shortness of breath: Secondary | ICD-10-CM | POA: Insufficient documentation

## 2016-10-10 DIAGNOSIS — Z5112 Encounter for antineoplastic immunotherapy: Secondary | ICD-10-CM | POA: Diagnosis not present

## 2016-10-10 DIAGNOSIS — E1165 Type 2 diabetes mellitus with hyperglycemia: Secondary | ICD-10-CM | POA: Insufficient documentation

## 2016-10-10 DIAGNOSIS — D61818 Other pancytopenia: Secondary | ICD-10-CM

## 2016-10-10 DIAGNOSIS — C7801 Secondary malignant neoplasm of right lung: Secondary | ICD-10-CM | POA: Diagnosis not present

## 2016-10-10 DIAGNOSIS — M109 Gout, unspecified: Secondary | ICD-10-CM | POA: Insufficient documentation

## 2016-10-10 DIAGNOSIS — I129 Hypertensive chronic kidney disease with stage 1 through stage 4 chronic kidney disease, or unspecified chronic kidney disease: Secondary | ICD-10-CM | POA: Insufficient documentation

## 2016-10-10 DIAGNOSIS — R69 Illness, unspecified: Secondary | ICD-10-CM | POA: Diagnosis not present

## 2016-10-10 DIAGNOSIS — Z87891 Personal history of nicotine dependence: Secondary | ICD-10-CM | POA: Insufficient documentation

## 2016-10-10 LAB — COMPREHENSIVE METABOLIC PANEL
ALK PHOS: 264 U/L — AB (ref 38–126)
ALT: 20 U/L (ref 17–63)
AST: 37 U/L (ref 15–41)
Albumin: 3.4 g/dL — ABNORMAL LOW (ref 3.5–5.0)
Anion gap: 10 (ref 5–15)
BUN: 26 mg/dL — AB (ref 6–20)
CALCIUM: 8.8 mg/dL — AB (ref 8.9–10.3)
CHLORIDE: 99 mmol/L — AB (ref 101–111)
CO2: 23 mmol/L (ref 22–32)
CREATININE: 1.6 mg/dL — AB (ref 0.61–1.24)
GFR calc Af Amer: 52 mL/min — ABNORMAL LOW (ref >60)
GFR, EST NON AFRICAN AMERICAN: 45 mL/min — AB (ref >60)
Glucose, Bld: 268 mg/dL — ABNORMAL HIGH (ref 65–99)
Potassium: 4.2 mmol/L (ref 3.5–5.1)
Sodium: 132 mmol/L — ABNORMAL LOW (ref 135–145)
Total Bilirubin: 1.2 mg/dL (ref 0.3–1.2)
Total Protein: 7.1 g/dL (ref 6.5–8.1)

## 2016-10-10 LAB — CBC WITH DIFFERENTIAL/PLATELET
Basophils Absolute: 0 10*3/uL (ref 0–0.1)
Basophils Relative: 1 %
EOS ABS: 0.2 10*3/uL (ref 0–0.7)
EOS PCT: 9 %
HCT: 23.5 % — ABNORMAL LOW (ref 40.0–52.0)
Hemoglobin: 7.4 g/dL — ABNORMAL LOW (ref 13.0–18.0)
LYMPHS ABS: 0.5 10*3/uL — AB (ref 1.0–3.6)
Lymphocytes Relative: 24 %
MCH: 25.6 pg — AB (ref 26.0–34.0)
MCHC: 31.5 g/dL — AB (ref 32.0–36.0)
MCV: 81.1 fL (ref 80.0–100.0)
MONOS PCT: 14 %
Monocytes Absolute: 0.3 10*3/uL (ref 0.2–1.0)
Neutro Abs: 1 10*3/uL — ABNORMAL LOW (ref 1.4–6.5)
Neutrophils Relative %: 52 %
PLATELETS: 99 10*3/uL — AB (ref 150–440)
RBC: 2.9 MIL/uL — AB (ref 4.40–5.90)
RDW: 15.2 % — ABNORMAL HIGH (ref 11.5–14.5)
WBC: 1.9 10*3/uL — AB (ref 3.8–10.6)

## 2016-10-10 MED ORDER — SODIUM CHLORIDE 0.9 % IV SOLN
Freq: Once | INTRAVENOUS | Status: AC
  Administered 2016-10-10: 12:00:00 via INTRAVENOUS
  Filled 2016-10-10: qty 1000

## 2016-10-10 MED ORDER — SODIUM CHLORIDE 0.9 % IV SOLN
240.0000 mg | Freq: Once | INTRAVENOUS | Status: AC
  Administered 2016-10-10: 240 mg via INTRAVENOUS
  Filled 2016-10-10: qty 24

## 2016-10-10 NOTE — Progress Notes (Signed)
Per nurse, MD ok to proceed with treatment with Hgb of 7.4, Plts 99, ANC of 1.

## 2016-10-10 NOTE — Progress Notes (Signed)
Here for follow up. C/o on going feet pain-neuralgia -burning and " hot " esp at end of the day. BS this am 240. Taking boost daily-may be a contributing factor. Educated re alternate protein drinks for diabetics. Will make appt at [facility] to discuss Gabapentin and BS issues

## 2016-10-11 LAB — THYROID PANEL WITH TSH
Free Thyroxine Index: 2.1 (ref 1.2–4.9)
T3 Uptake Ratio: 31 % (ref 24–39)
T4 TOTAL: 6.8 ug/dL (ref 4.5–12.0)
TSH: 7.46 u[IU]/mL — AB (ref 0.450–4.500)

## 2016-10-15 ENCOUNTER — Ambulatory Visit
Admission: RE | Admit: 2016-10-15 | Discharge: 2016-10-15 | Disposition: A | Discharge status: Home / Self Care | Payer: Medicare HMO | Source: Ambulatory Visit | Attending: Oncology | Admitting: Oncology

## 2016-10-15 DIAGNOSIS — C649 Malignant neoplasm of unspecified kidney, except renal pelvis: Secondary | ICD-10-CM | POA: Diagnosis not present

## 2016-10-15 DIAGNOSIS — K769 Liver disease, unspecified: Secondary | ICD-10-CM | POA: Insufficient documentation

## 2016-10-15 DIAGNOSIS — C7802 Secondary malignant neoplasm of left lung: Secondary | ICD-10-CM | POA: Insufficient documentation

## 2016-10-15 DIAGNOSIS — R599 Enlarged lymph nodes, unspecified: Secondary | ICD-10-CM | POA: Insufficient documentation

## 2016-10-15 DIAGNOSIS — R161 Splenomegaly, not elsewhere classified: Secondary | ICD-10-CM | POA: Diagnosis not present

## 2016-10-15 DIAGNOSIS — R188 Other ascites: Secondary | ICD-10-CM | POA: Diagnosis not present

## 2016-10-15 LAB — GLUCOSE, CAPILLARY: Glucose-Capillary: 128 mg/dL — ABNORMAL HIGH (ref 65–99)

## 2016-10-15 MED ORDER — FLUDEOXYGLUCOSE F - 18 (FDG) INJECTION
12.6400 | Freq: Once | INTRAVENOUS | Status: AC | PRN
  Administered 2016-10-15: 12.64 via INTRAVENOUS

## 2016-10-16 DIAGNOSIS — R69 Illness, unspecified: Secondary | ICD-10-CM | POA: Diagnosis not present

## 2016-10-18 ENCOUNTER — Telehealth: Payer: Self-pay | Admitting: *Deleted

## 2016-10-18 NOTE — Telephone Encounter (Signed)
Call returned to [facility] from [facility], information still needed regarding coverage for Opdivo. I answered questions related to clinical information needed, much of the information needed was in regards to current balances and pending charges for [place] for dates of service 06/2016-present. I gave Zenia Resides contact information for [facility]orn Loser was able to assist [place] with the needed information. Pt currently has no outstanding balance. Pt notified and verbalized understanding.

## 2016-10-19 DIAGNOSIS — N183 Chronic kidney disease, stage 3 (moderate): Secondary | ICD-10-CM | POA: Diagnosis not present

## 2016-10-19 DIAGNOSIS — R69 Illness, unspecified: Secondary | ICD-10-CM | POA: Diagnosis not present

## 2016-10-19 DIAGNOSIS — E1140 Type 2 diabetes mellitus with diabetic neuropathy, unspecified: Secondary | ICD-10-CM | POA: Diagnosis not present

## 2016-10-19 DIAGNOSIS — Z794 Long term (current) use of insulin: Secondary | ICD-10-CM | POA: Diagnosis not present

## 2016-10-19 DIAGNOSIS — I10 Essential (primary) hypertension: Secondary | ICD-10-CM | POA: Diagnosis not present

## 2016-10-19 DIAGNOSIS — K7460 Unspecified cirrhosis of liver: Secondary | ICD-10-CM | POA: Diagnosis not present

## 2016-10-19 DIAGNOSIS — D61818 Other pancytopenia: Secondary | ICD-10-CM | POA: Diagnosis not present

## 2016-10-19 DIAGNOSIS — T162XXA Foreign body in left ear, initial encounter: Secondary | ICD-10-CM | POA: Diagnosis not present

## 2016-10-19 DIAGNOSIS — C642 Malignant neoplasm of left kidney, except renal pelvis: Secondary | ICD-10-CM | POA: Diagnosis not present

## 2016-10-19 DIAGNOSIS — M25422 Effusion, left elbow: Secondary | ICD-10-CM | POA: Diagnosis not present

## 2016-10-23 NOTE — Progress Notes (Signed)
Rodney Stewart  [phone] [phone]) [phone]  ID: Diamantina Providence OB: [birth year]  MR#: [id number]  KWI#:[id number]  Patient Care Team: System, Pcp Not In as PCP - General  CHIEF COMPLAINT: Stage IV renal cell carcinoma with bilateral lung metastases.  INTERVAL HISTORY: Patient returns to clinic today for further evaluation, discussion of his PET scan results, and consideration of cycle 15 of nivolumab. He is tolerating his treatments well without significant side effects. He continues to have significant peripheral neuropathy in both feet, but has no other neurologic complaints. He continues to have poor blood glucose control. He denies any fevers, chills, or recent illnesses. He has a good appetite and denies weight loss. He denies any chest pain, shortness of breath, cough, or hemoptysis. He has no nausea, vomiting, constipation, or diarrhea. He has no melena or hematochezia. He has no urinary complaints. Patient offers no further specific complaints today.   REVIEW OF SYSTEMS:   Review of Systems  Constitutional: Negative.  Negative for fever, malaise/fatigue and weight loss.  Eyes: Negative for blurred vision.  Respiratory: Negative for cough and shortness of breath.   Cardiovascular: Negative.  Negative for chest pain and leg swelling.  Gastrointestinal: Negative.  Negative for abdominal pain, constipation, diarrhea, nausea and vomiting.  Genitourinary: Negative.   Musculoskeletal: Negative.   Neurological: Positive for tingling and sensory change. Negative for weakness and headaches.  Psychiatric/Behavioral: Negative.  The patient is not nervous/anxious and does not have insomnia.    As per HPI. Otherwise, a complete review of systems is negative.   PAST MEDICAL HISTORY: Past Medical History:  Diagnosis Date  . Anemia   . BPH (benign prostatic hyperplasia)   . CKD (chronic kidney disease) stage 3, GFR 30-59 ml/min (HCC)   . Clear cell renal cell  carcinoma (HCC)   . Diabetes mellitus without complication ([place])   . Dyspnea    with exertion  . GERD (gastroesophageal reflux disease)   . History of gout   . History of nephrectomy    Left  . Hypertension   . Neuropathy   . Renal cell carcinoma of left kidney (HCC)    mets to lungs  . Umbilical hernia     PAST SURGICAL HISTORY: Past Surgical History:  Procedure Laterality Date  . BACK SURGERY    . ENDOBRONCHIAL ULTRASOUND N/A 10/14/2015   Procedure: ENDOBRONCHIAL ULTRASOUND;  Surgeon: [name], MD;  Location: [facility] ORS;  Service: Pulmonary;  Laterality: N/A;  . EYE SURGERY Bilateral    Cataract Extraction with IOL  . KNEE SURGERY Right   . NEPHRECTOMY      FAMILY HISTORY: Family History  Problem Relation Age of Onset  . Ovarian cancer Mother   . Diabetes Father   . Hypertension Father     ADVANCED DIRECTIVES (Y/N):  N  HEALTH MAINTENANCE: Social History  Substance Use Topics  . Smoking status: Former Smoker    Packs/day: 1.00    Types: Cigarettes  . Smokeless tobacco: Never Used  . Alcohol use 25.2 oz/week    42 Cans of beer per week     Comment: 4-5 cans of beer a day     Colonoscopy:  PAP:  Bone density:  Lipid panel:  No Known Allergies  Current Outpatient Prescriptions  Medication Sig Dispense Refill  . ALPRAZolam (XANAX) 1 MG tablet Take 1 mg by mouth as needed for sleep.    Marland Kitchen amLODipine (NORVASC) 10 MG tablet Take 10 mg by mouth daily.     Marland Kitchen  colchicine 0.6 MG tablet Take 0.6 mg by mouth every other day.     . gabapentin (NEURONTIN) 600 MG tablet Take 600 mg by mouth daily.    . insulin aspart (NOVOLOG) 100 UNIT/ML FlexPen Inject 5 Units into the skin.     . Insulin Detemir (LEVEMIR FLEXTOUCH) 100 UNIT/ML Pen Inject 45 Units into the skin.     Marland Kitchen losartan (COZAAR) 25 MG tablet Take 50 mg by mouth daily.     Marland Kitchen lovastatin (MEVACOR) 20 MG tablet Take 20 mg by mouth daily.     . Nebivolol HCl 20 MG TABS Take 20 mg by mouth daily.    .  ONE TOUCH ULTRA TEST test strip     . tamsulosin (FLOMAX) 0.4 MG CAPS capsule Take 0.4 mg by mouth daily.      No current facility-administered medications for this visit.    Facility-Administered Medications Ordered in Other Visits  Medication Dose Route Frequency Provider Last Rate Last Dose  . nivolumab (OPDIVO) 240 mg in sodium chloride 0.9 % 100 mL chemo infusion  240 mg Intravenous Once Lloyd Huger, MD        OBJECTIVE: Vitals:   10/24/16 1036  BP: 128/80  Pulse: (!) 58  Resp: 20  Temp: (!) 96.3 F (35.7 C)     Body mass index is 23.82 kg/m.    ECOG FS:0 - Asymptomatic  General: Well-developed, well-nourished, no acute distress. Eyes: Pink conjunctiva, anicteric sclera. Lungs: Clear to auscultation bilaterally. Heart: Regular rate and rhythm. No rubs, murmurs, or gallops. Abdomen: Soft, nontender, nondistended. No organomegaly noted, normoactive bowel sounds. Musculoskeletal: No edema, cyanosis, or clubbing. Neuro: Alert, answering all questions appropriately. Cranial nerves grossly intact. Skin: No rashes or petechiae noted. Psych: Normal affect.      LAB RESULTS:  Lab Results  Component Value Date   NA 134 (L) 10/24/2016   K 4.2 10/24/2016   CL 100 (L) 10/24/2016   CO2 23 10/24/2016   GLUCOSE 136 (H) 10/24/2016   BUN 18 10/24/2016   CREATININE 1.41 (H) 10/24/2016   CALCIUM 9.1 10/24/2016   PROT 7.9 10/24/2016   ALBUMIN 3.9 10/24/2016   AST 58 (H) 10/24/2016   ALT 32 10/24/2016   ALKPHOS 357 (H) 10/24/2016   BILITOT 1.5 (H) 10/24/2016   GFRNONAA 53 (L) 10/24/2016   GFRAA >60 10/24/2016    Lab Results  Component Value Date   WBC 2.1 (L) 10/24/2016   NEUTROABS 1.3 (L) 10/24/2016   HGB 9.1 (L) 10/24/2016   HCT 29.3 (L) 10/24/2016   MCV 78.6 (L) 10/24/2016   PLT 125 (L) 10/24/2016     STUDIES: Nm Pet Image Restag (ps) Skull Base To Thigh  Result Date: 10/15/2016 CLINICAL DATA:  Subsequent treatment strategy for renal cell carcinoma.  EXAM: NUCLEAR MEDICINE PET SKULL BASE TO THIGH TECHNIQUE: 12.6 mCi F-18 FDG was injected intravenously. Full-ring PET imaging was performed from the skull base to thigh after the radiotracer. CT data was obtained and used for attenuation correction and anatomic localization. FASTING BLOOD GLUCOSE:  Value: 120 mg/dl COMPARISON:  07/09/2016 FINDINGS: NECK: No hypermetabolic lymph nodes in the neck. CHEST: Left lower lobe pulmonary nodule measures 2.4 cm, image 111 of series 3. SUV max associated with this nodule is equal to 2.4. On the previous exam this nodule measured 2.4 cm and had an SUV max equal to 2.6. Right upper lobe pulmonary nodule measure 1.9 cm and has an SUV max equal to 1.0. Unchanged from previous exam.  The sub- carinal lymph node measure 2.4 cm and has an SUV max equal to 2.69. Previously this measured 2.4 cm and had an SUV max equal to 3.85. Stable mild bilateral hilar hypermetabolism. ABDOMEN/PELVIS: No abnormal hypermetabolic activity within the liver, pancreas, adrenal glands, or spleen. The liver margins are irregular. Stones identified within the gallbladder. Spleen is enlarged. Status post left adrenalectomy and left nephrectomy. No hypermetabolic lymph nodes in the abdomen or pelvis. Ventral abdominal wall hernia contains fat only. Similar volume of ascites. Left-sided hydrocele is identified. SKELETON: No focal hypermetabolic activity to suggest skeletal metastasis. IMPRESSION: 1. Mild decrease in size and FDG uptake associated with subcarinal adenopathy. 2. Stable appearance of dominant nodule within the left lower lobe. 3. Morphologic features of liver compatible with cirrhosis. 4. Splenomegaly and ascites. Electronically Signed   By: Kerby Moors M.D.   On: 10/15/2016 14:23     Oncology history: Patient underwent left nephrectomy on May 16, 2012 which revealed a clear cell grade 2 renal cell carcinoma, stage TIIIa, N0, M0. Tumor size of 16 cm. Patient was noted to have renal vein  involvement, but no other structures were involved. 0 of 2 lymph nodes were negative for disease. PET scan on October 20, 2015 revealed metastatic disease and patient was initiated on Votrient. This was subsequently discontinued in March 2018 secondary to progression of disease. Patient initiated second line treatment with nivolumab on Monday, April 02, 2016  ASSESSMENT: Stage IV left renal cell carcinoma with metastasis to the lungs  PLAN:    1. Stage IV left renal cell carcinoma with metastasis to the lungs: PET scan results from October 15, 2016 reviewed independently and reported as above with continued improvement of disease burden. Proceed with cycle 15 of nivolumab today. Patient will receive a flat dose of 240 mg every 2 weeks until intolerable side effects or progression of disease. Return to clinic in 2 weeks for consideration of cycle 16 and then in 4 weeks for further evaluation and consideration of cycle 17. Will repeat PET scan in February 2018.  2. Pancytopenia: Patient's blood counts are decreased, but essentially stable. Consider bone marrow biopsy in the future to determine an etiology.   3. Renal insufficiency: Patient's creatinine is approximately at his baseline. Continue to monitor closely.  4. Peripheral neuropathy: Likely secondary to diabetes, continue current dose of gabapentin. Patient plans to discuss with his primary care adjusting his medications possibly pursuing Lyrica. Monitor. 5. Hyperbilirubinemia: Resolved. 6. Hyperglycemia: Improved. Unrelated to current treatment. Continue close follow-up with primary care.  Patient expressed understanding and was in agreement with this plan. He also understands that He can call clinic at any time with any questions, concerns, or complaints.   Cancer Staging Metastatic renal cell carcinoma to lung [facility]) Staging form: Kidney, AJCC 7th Edition - Clinical stage from 10/12/2015: Stage IV (TX, N0, M1) - Signed by Lloyd Huger,  MD on 10/12/2015   Lloyd Huger, MD 10/24/16 11:28 AM

## 2016-10-24 ENCOUNTER — Inpatient Hospital Stay (HOSPITAL_BASED_OUTPATIENT_CLINIC_OR_DEPARTMENT_OTHER): Payer: Medicare HMO | Admitting: Oncology

## 2016-10-24 ENCOUNTER — Inpatient Hospital Stay: Payer: Medicare HMO

## 2016-10-24 VITALS — BP 128/80 | HR 58 | Temp 96.3°F | Resp 20 | Wt 170.8 lb

## 2016-10-24 DIAGNOSIS — C7801 Secondary malignant neoplasm of right lung: Secondary | ICD-10-CM | POA: Diagnosis not present

## 2016-10-24 DIAGNOSIS — D61818 Other pancytopenia: Secondary | ICD-10-CM | POA: Diagnosis not present

## 2016-10-24 DIAGNOSIS — I129 Hypertensive chronic kidney disease with stage 1 through stage 4 chronic kidney disease, or unspecified chronic kidney disease: Secondary | ICD-10-CM | POA: Diagnosis not present

## 2016-10-24 DIAGNOSIS — C649 Malignant neoplasm of unspecified kidney, except renal pelvis: Secondary | ICD-10-CM

## 2016-10-24 DIAGNOSIS — E1140 Type 2 diabetes mellitus with diabetic neuropathy, unspecified: Secondary | ICD-10-CM | POA: Diagnosis not present

## 2016-10-24 DIAGNOSIS — C7802 Secondary malignant neoplasm of left lung: Secondary | ICD-10-CM

## 2016-10-24 DIAGNOSIS — Z5112 Encounter for antineoplastic immunotherapy: Secondary | ICD-10-CM | POA: Diagnosis not present

## 2016-10-24 DIAGNOSIS — N183 Chronic kidney disease, stage 3 (moderate): Secondary | ICD-10-CM | POA: Diagnosis not present

## 2016-10-24 DIAGNOSIS — Z79899 Other long term (current) drug therapy: Secondary | ICD-10-CM | POA: Diagnosis not present

## 2016-10-24 DIAGNOSIS — G629 Polyneuropathy, unspecified: Secondary | ICD-10-CM | POA: Diagnosis not present

## 2016-10-24 DIAGNOSIS — N400 Enlarged prostate without lower urinary tract symptoms: Secondary | ICD-10-CM | POA: Diagnosis not present

## 2016-10-24 LAB — CBC WITH DIFFERENTIAL/PLATELET
BASOS ABS: 0 10*3/uL (ref 0–0.1)
Basophils Relative: 0 %
EOS ABS: 0.1 10*3/uL (ref 0–0.7)
Eosinophils Relative: 7 %
HCT: 29.3 % — ABNORMAL LOW (ref 40.0–52.0)
HEMOGLOBIN: 9.1 g/dL — AB (ref 13.0–18.0)
LYMPHS PCT: 19 %
Lymphs Abs: 0.4 10*3/uL — ABNORMAL LOW (ref 1.0–3.6)
MCH: 24.3 pg — ABNORMAL LOW (ref 26.0–34.0)
MCHC: 30.9 g/dL — ABNORMAL LOW (ref 32.0–36.0)
MCV: 78.6 fL — ABNORMAL LOW (ref 80.0–100.0)
Monocytes Absolute: 0.3 10*3/uL (ref 0.2–1.0)
Monocytes Relative: 13 %
NEUTROS PCT: 61 %
Neutro Abs: 1.3 10*3/uL — ABNORMAL LOW (ref 1.4–6.5)
Platelets: 125 10*3/uL — ABNORMAL LOW (ref 150–440)
RBC: 3.73 MIL/uL — AB (ref 4.40–5.90)
RDW: 15.1 % — ABNORMAL HIGH (ref 11.5–14.5)
WBC: 2.1 10*3/uL — AB (ref 3.8–10.6)

## 2016-10-24 LAB — COMPREHENSIVE METABOLIC PANEL
ALBUMIN: 3.9 g/dL (ref 3.5–5.0)
ALK PHOS: 357 U/L — AB (ref 38–126)
ALT: 32 U/L (ref 17–63)
AST: 58 U/L — AB (ref 15–41)
Anion gap: 11 (ref 5–15)
BUN: 18 mg/dL (ref 6–20)
CALCIUM: 9.1 mg/dL (ref 8.9–10.3)
CO2: 23 mmol/L (ref 22–32)
CREATININE: 1.41 mg/dL — AB (ref 0.61–1.24)
Chloride: 100 mmol/L — ABNORMAL LOW (ref 101–111)
GFR calc non Af Amer: 53 mL/min — ABNORMAL LOW (ref >60)
GLUCOSE: 136 mg/dL — AB (ref 65–99)
Potassium: 4.2 mmol/L (ref 3.5–5.1)
SODIUM: 134 mmol/L — AB (ref 135–145)
Total Bilirubin: 1.5 mg/dL — ABNORMAL HIGH (ref 0.3–1.2)
Total Protein: 7.9 g/dL (ref 6.5–8.1)

## 2016-10-24 LAB — SAMPLE TO BLOOD BANK

## 2016-10-24 MED ORDER — SODIUM CHLORIDE 0.9 % IV SOLN
240.0000 mg | Freq: Once | INTRAVENOUS | Status: AC
  Administered 2016-10-24: 240 mg via INTRAVENOUS
  Filled 2016-10-24: qty 24

## 2016-10-24 MED ORDER — SODIUM CHLORIDE 0.9 % IV SOLN
Freq: Once | INTRAVENOUS | Status: AC
  Administered 2016-10-24: 11:00:00 via INTRAVENOUS
  Filled 2016-10-24: qty 1000

## 2016-10-24 NOTE — Progress Notes (Signed)
Patient denies any concerns today.  

## 2016-10-25 LAB — THYROID PANEL WITH TSH
Free Thyroxine Index: 2 (ref 1.2–4.9)
T3 Uptake Ratio: 28 % (ref 24–39)
T4 TOTAL: 7.1 ug/dL (ref 4.5–12.0)
TSH: 7.24 u[IU]/mL — AB (ref 0.450–4.500)

## 2016-10-27 DIAGNOSIS — T162XXA Foreign body in left ear, initial encounter: Secondary | ICD-10-CM | POA: Insufficient documentation

## 2016-10-27 DIAGNOSIS — M25422 Effusion, left elbow: Secondary | ICD-10-CM | POA: Insufficient documentation

## 2016-10-27 DIAGNOSIS — D61818 Other pancytopenia: Secondary | ICD-10-CM | POA: Insufficient documentation

## 2016-10-27 DIAGNOSIS — N400 Enlarged prostate without lower urinary tract symptoms: Secondary | ICD-10-CM | POA: Insufficient documentation

## 2016-11-07 ENCOUNTER — Other Ambulatory Visit: Payer: Self-pay | Admitting: Oncology

## 2016-11-07 ENCOUNTER — Inpatient Hospital Stay: Payer: Medicare HMO

## 2016-11-07 ENCOUNTER — Inpatient Hospital Stay: Payer: Medicare HMO | Attending: Oncology

## 2016-11-07 VITALS — BP 130/77 | HR 56 | Temp 95.6°F | Resp 18

## 2016-11-07 DIAGNOSIS — C7800 Secondary malignant neoplasm of unspecified lung: Secondary | ICD-10-CM | POA: Insufficient documentation

## 2016-11-07 DIAGNOSIS — Z87891 Personal history of nicotine dependence: Secondary | ICD-10-CM | POA: Diagnosis not present

## 2016-11-07 DIAGNOSIS — I129 Hypertensive chronic kidney disease with stage 1 through stage 4 chronic kidney disease, or unspecified chronic kidney disease: Secondary | ICD-10-CM | POA: Diagnosis not present

## 2016-11-07 DIAGNOSIS — M109 Gout, unspecified: Secondary | ICD-10-CM | POA: Insufficient documentation

## 2016-11-07 DIAGNOSIS — C7802 Secondary malignant neoplasm of left lung: Principal | ICD-10-CM

## 2016-11-07 DIAGNOSIS — Z905 Acquired absence of kidney: Secondary | ICD-10-CM | POA: Diagnosis not present

## 2016-11-07 DIAGNOSIS — E1165 Type 2 diabetes mellitus with hyperglycemia: Secondary | ICD-10-CM | POA: Insufficient documentation

## 2016-11-07 DIAGNOSIS — N400 Enlarged prostate without lower urinary tract symptoms: Secondary | ICD-10-CM | POA: Diagnosis not present

## 2016-11-07 DIAGNOSIS — Z79899 Other long term (current) drug therapy: Secondary | ICD-10-CM | POA: Diagnosis not present

## 2016-11-07 DIAGNOSIS — D61818 Other pancytopenia: Secondary | ICD-10-CM | POA: Insufficient documentation

## 2016-11-07 DIAGNOSIS — K219 Gastro-esophageal reflux disease without esophagitis: Secondary | ICD-10-CM | POA: Diagnosis not present

## 2016-11-07 DIAGNOSIS — Z794 Long term (current) use of insulin: Secondary | ICD-10-CM | POA: Diagnosis not present

## 2016-11-07 DIAGNOSIS — C642 Malignant neoplasm of left kidney, except renal pelvis: Secondary | ICD-10-CM | POA: Insufficient documentation

## 2016-11-07 DIAGNOSIS — Z5111 Encounter for antineoplastic chemotherapy: Secondary | ICD-10-CM | POA: Insufficient documentation

## 2016-11-07 DIAGNOSIS — C649 Malignant neoplasm of unspecified kidney, except renal pelvis: Secondary | ICD-10-CM

## 2016-11-07 DIAGNOSIS — E1140 Type 2 diabetes mellitus with diabetic neuropathy, unspecified: Secondary | ICD-10-CM | POA: Diagnosis not present

## 2016-11-07 DIAGNOSIS — N183 Chronic kidney disease, stage 3 (moderate): Secondary | ICD-10-CM | POA: Insufficient documentation

## 2016-11-07 LAB — CBC WITH DIFFERENTIAL/PLATELET
BASOS PCT: 0 %
Basophils Absolute: 0 10*3/uL (ref 0–0.1)
EOS ABS: 0.2 10*3/uL (ref 0–0.7)
Eosinophils Relative: 10 %
HCT: 25.2 % — ABNORMAL LOW (ref 40.0–52.0)
HEMOGLOBIN: 7.8 g/dL — AB (ref 13.0–18.0)
Lymphocytes Relative: 27 %
Lymphs Abs: 0.5 10*3/uL — ABNORMAL LOW (ref 1.0–3.6)
MCH: 23.5 pg — ABNORMAL LOW (ref 26.0–34.0)
MCHC: 31.1 g/dL — AB (ref 32.0–36.0)
MCV: 75.6 fL — ABNORMAL LOW (ref 80.0–100.0)
MONO ABS: 0.3 10*3/uL (ref 0.2–1.0)
MONOS PCT: 14 %
NEUTROS PCT: 49 %
Neutro Abs: 1 10*3/uL — ABNORMAL LOW (ref 1.4–6.5)
PLATELETS: 110 10*3/uL — AB (ref 150–440)
RBC: 3.33 MIL/uL — ABNORMAL LOW (ref 4.40–5.90)
RDW: 15.3 % — AB (ref 11.5–14.5)
WBC: 2 10*3/uL — ABNORMAL LOW (ref 3.8–10.6)

## 2016-11-07 LAB — COMPREHENSIVE METABOLIC PANEL
ALK PHOS: 347 U/L — AB (ref 38–126)
ALT: 29 U/L (ref 17–63)
AST: 46 U/L — ABNORMAL HIGH (ref 15–41)
Albumin: 3.5 g/dL (ref 3.5–5.0)
Anion gap: 6 (ref 5–15)
BUN: 21 mg/dL — ABNORMAL HIGH (ref 6–20)
CALCIUM: 8.9 mg/dL (ref 8.9–10.3)
CO2: 26 mmol/L (ref 22–32)
CREATININE: 1.35 mg/dL — AB (ref 0.61–1.24)
Chloride: 100 mmol/L — ABNORMAL LOW (ref 101–111)
GFR calc non Af Amer: 56 mL/min — ABNORMAL LOW (ref >60)
Glucose, Bld: 96 mg/dL (ref 65–99)
Potassium: 4.6 mmol/L (ref 3.5–5.1)
SODIUM: 132 mmol/L — AB (ref 135–145)
Total Bilirubin: 1.4 mg/dL — ABNORMAL HIGH (ref 0.3–1.2)
Total Protein: 7.1 g/dL (ref 6.5–8.1)

## 2016-11-07 MED ORDER — SODIUM CHLORIDE 0.9 % IV SOLN
Freq: Once | INTRAVENOUS | Status: AC
  Administered 2016-11-07: 11:00:00 via INTRAVENOUS
  Filled 2016-11-07: qty 1000

## 2016-11-07 MED ORDER — SODIUM CHLORIDE 0.9 % IV SOLN
240.0000 mg | Freq: Once | INTRAVENOUS | Status: AC
  Administered 2016-11-07: 240 mg via INTRAVENOUS
  Filled 2016-11-07: qty 24

## 2016-11-08 LAB — THYROID PANEL WITH TSH
FREE THYROXINE INDEX: 1.7 (ref 1.2–4.9)
T3 UPTAKE RATIO: 27 % (ref 24–39)
T4, Total: 6.2 ug/dL (ref 4.5–12.0)
TSH: 11.73 u[IU]/mL — ABNORMAL HIGH (ref 0.450–4.500)

## 2016-11-27 DIAGNOSIS — Z7189 Other specified counseling: Secondary | ICD-10-CM | POA: Insufficient documentation

## 2016-11-27 NOTE — Progress Notes (Signed)
[place]  [phone] [phone]) [phone]  ID: Diamantina Providence OB: [birth year]  MR#: [id number]  OIZ#:[id number]  Patient Care Team: System, Pcp Not In as PCP - General  CHIEF COMPLAINT: Stage IV renal cell carcinoma with bilateral lung metastases.  INTERVAL HISTORY: Patient returns to clinic today for further evaluation and consideration of cycle 17 of nivolumab. He is tolerating his treatments well without significant side effects. He continues to have significant peripheral neuropathy in both feet, but has no other neurologic complaints. He continues to have poor blood glucose control. He denies any fevers, chills, or recent illnesses. He has a good appetite and denies weight loss. He denies any chest pain, shortness of breath, cough, or hemoptysis. He has no nausea, vomiting, constipation, or diarrhea. He has no melena or hematochezia. He has no urinary complaints. Patient offers no further specific complaints today.   REVIEW OF SYSTEMS:   Review of Systems  Constitutional: Negative.  Negative for fever, malaise/fatigue and weight loss.  Eyes: Negative for blurred vision.  Respiratory: Negative for cough and shortness of breath.   Cardiovascular: Negative.  Negative for chest pain and leg swelling.  Gastrointestinal: Negative.  Negative for abdominal pain, constipation, diarrhea, nausea and vomiting.  Genitourinary: Negative.   Musculoskeletal: Negative.   Neurological: Positive for tingling and sensory change. Negative for weakness and headaches.  Psychiatric/Behavioral: Negative.  The patient is not nervous/anxious and does not have insomnia.    As per HPI. Otherwise, a complete review of systems is negative.   PAST MEDICAL HISTORY: Past Medical History:  Diagnosis Date  . Anemia   . BPH (benign prostatic hyperplasia)   . CKD (chronic kidney disease) stage 3, GFR 30-59 ml/min (HCC)   . Clear cell renal cell carcinoma (HCC)   . Diabetes mellitus  without complication ([place])   . Dyspnea    with exertion  . GERD (gastroesophageal reflux disease)   . History of gout   . History of nephrectomy    Left  . Hypertension   . Neuropathy   . Renal cell carcinoma of left kidney (HCC)    mets to lungs  . Umbilical hernia     PAST SURGICAL HISTORY: Past Surgical History:  Procedure Laterality Date  . BACK SURGERY    . ENDOBRONCHIAL ULTRASOUND N/A 10/14/2015   Procedure: ENDOBRONCHIAL ULTRASOUND;  Surgeon: [name], MD;  Location: [facility] ORS;  Service: Pulmonary;  Laterality: N/A;  . EYE SURGERY Bilateral    Cataract Extraction with IOL  . KNEE SURGERY Right   . NEPHRECTOMY      FAMILY HISTORY: Family History  Problem Relation Age of Onset  . Ovarian cancer Mother   . Diabetes Father   . Hypertension Father     ADVANCED DIRECTIVES (Y/N):  N  HEALTH MAINTENANCE: Social History   Tobacco Use  . Smoking status: Former Smoker    Packs/day: 1.00    Types: Cigarettes  . Smokeless tobacco: Never Used  Substance Use Topics  . Alcohol use: Yes    Alcohol/week: 25.2 oz    Types: 42 Cans of beer per week    Comment: 4-5 cans of beer a day  . Drug use: No     Colonoscopy:  PAP:  Bone density:  Lipid panel:  No Known Allergies  Current Outpatient Medications  Medication Sig Dispense Refill  . ALPRAZolam (XANAX) 1 MG tablet Take 1 mg by mouth as needed for sleep.    Marland Kitchen amLODipine (NORVASC) 10 MG tablet Take  10 mg by mouth daily.     . colchicine 0.6 MG tablet Take 0.6 mg by mouth every other day.     . gabapentin (NEURONTIN) 600 MG tablet Take 600 mg by mouth daily.    . insulin aspart (NOVOLOG) 100 UNIT/ML FlexPen Inject 5 Units into the skin.     . Insulin Detemir (LEVEMIR FLEXTOUCH) 100 UNIT/ML Pen Inject 45 Units into the skin.     Marland Kitchen losartan (COZAAR) 25 MG tablet Take 50 mg by mouth daily.     Marland Kitchen lovastatin (MEVACOR) 20 MG tablet Take 20 mg by mouth daily.     . Nebivolol HCl 20 MG TABS Take 20 mg by mouth  daily.    . ONE TOUCH ULTRA TEST test strip     . tamsulosin (FLOMAX) 0.4 MG CAPS capsule Take 0.4 mg by mouth daily.      No current facility-administered medications for this visit.     OBJECTIVE: Vitals:   11/28/16 1041  BP: 118/75  Pulse: 65  Resp: 20  Temp: (!) 96.3 F (35.7 C)     Body mass index is 24.57 kg/m.    ECOG FS:0 - Asymptomatic  General: Well-developed, well-nourished, no acute distress. Eyes: Pink conjunctiva, anicteric sclera. Lungs: Clear to auscultation bilaterally. Heart: Regular rate and rhythm. No rubs, murmurs, or gallops. Abdomen: Soft, nontender, nondistended. No organomegaly noted, normoactive bowel sounds. Musculoskeletal: No edema, cyanosis, or clubbing. Neuro: Alert, answering all questions appropriately. Cranial nerves grossly intact. Skin: No rashes or petechiae noted. Psych: Normal affect.      LAB RESULTS:  Lab Results  Component Value Date   NA 135 11/28/2016   K 3.8 11/28/2016   CL 104 11/28/2016   CO2 23 11/28/2016   GLUCOSE 64 (L) 11/28/2016   BUN 20 11/28/2016   CREATININE 1.49 (H) 11/28/2016   CALCIUM 9.0 11/28/2016   PROT 7.2 11/28/2016   ALBUMIN 3.6 11/28/2016   AST 47 (H) 11/28/2016   ALT 25 11/28/2016   ALKPHOS 280 (H) 11/28/2016   BILITOT 1.7 (H) 11/28/2016   GFRNONAA 49 (L) 11/28/2016   GFRAA 57 (L) 11/28/2016    Lab Results  Component Value Date   WBC 2.9 (L) 11/28/2016   NEUTROABS 1.3 (L) 11/28/2016   HGB 7.8 (L) 11/28/2016   HCT 25.8 (L) 11/28/2016   MCV 74.2 (L) 11/28/2016   PLT 126 (L) 11/28/2016     STUDIES: No results found.   Oncology history: Patient underwent left nephrectomy on May 16, 2012 which revealed a clear cell grade 2 renal cell carcinoma, stage TIIIa, N0, M0. Tumor size of 16 cm. Patient was noted to have renal vein involvement, but no other structures were involved. 0 of 2 lymph nodes were negative for disease. PET scan on October 20, 2015 revealed metastatic disease and patient was  initiated on Votrient. This was subsequently discontinued in March 2018 secondary to progression of disease. Patient initiated second line treatment with nivolumab on Monday, April 02, 2016  ASSESSMENT: Stage IV left renal cell carcinoma with metastasis to the lungs  PLAN:    1. Stage IV left renal cell carcinoma with metastasis to the lungs: PET scan results from October 15, 2016 reviewed independently with continued improvement of disease burden. Proceed with cycle 17 of nivolumab today. Patient will receive a flat dose of 240 mg every 2 weeks until intolerable side effects or progression of disease. Return to clinic in 2 weeks for consideration of cycle 18 and then in 4  weeks for further evaluation and consideration of cycle 19. Will repeat PET scan in February 2018.  2. Pancytopenia: Patient's blood counts are decreased, but essentially stable. Consider bone marrow biopsy in the future to determine an etiology.   3. Renal insufficiency: Patient's creatinine is approximately at his baseline. Continue to monitor closely.  4. Peripheral neuropathy: Likely secondary to diabetes, continue current dose of gabapentin. Patient plans to discuss with his primary care adjusting his medications possibly pursuing Lyrica. Monitor. 5. Hyperbilirubinemia: Slightly worse today, monitor. 6. Hyperglycemia: Improved. Unrelated to current treatment. Continue close follow-up with primary care.  Patient expressed understanding and was in agreement with this plan. He also understands that He can call clinic at any time with any questions, concerns, or complaints.   Cancer Staging Metastatic renal cell carcinoma to lung [facility]) Staging form: Kidney, AJCC 7th Edition - Clinical stage from 10/12/2015: Stage IV (TX, N0, M1) - Signed by Lloyd Huger, MD on 10/12/2015   Lloyd Huger, MD 11/30/16 3:16 PM

## 2016-11-28 ENCOUNTER — Inpatient Hospital Stay (HOSPITAL_BASED_OUTPATIENT_CLINIC_OR_DEPARTMENT_OTHER): Payer: Medicare HMO | Admitting: Oncology

## 2016-11-28 ENCOUNTER — Inpatient Hospital Stay: Payer: Medicare HMO

## 2016-11-28 ENCOUNTER — Encounter: Payer: Self-pay | Admitting: Oncology

## 2016-11-28 ENCOUNTER — Other Ambulatory Visit: Payer: Self-pay

## 2016-11-28 VITALS — BP 118/75 | HR 65 | Temp 96.3°F | Resp 20 | Wt 176.2 lb

## 2016-11-28 DIAGNOSIS — E1140 Type 2 diabetes mellitus with diabetic neuropathy, unspecified: Secondary | ICD-10-CM | POA: Diagnosis not present

## 2016-11-28 DIAGNOSIS — C7802 Secondary malignant neoplasm of left lung: Principal | ICD-10-CM

## 2016-11-28 DIAGNOSIS — C642 Malignant neoplasm of left kidney, except renal pelvis: Secondary | ICD-10-CM | POA: Diagnosis not present

## 2016-11-28 DIAGNOSIS — C649 Malignant neoplasm of unspecified kidney, except renal pelvis: Secondary | ICD-10-CM

## 2016-11-28 DIAGNOSIS — Z7189 Other specified counseling: Secondary | ICD-10-CM

## 2016-11-28 DIAGNOSIS — E1165 Type 2 diabetes mellitus with hyperglycemia: Secondary | ICD-10-CM

## 2016-11-28 DIAGNOSIS — D61818 Other pancytopenia: Secondary | ICD-10-CM

## 2016-11-28 DIAGNOSIS — Z5111 Encounter for antineoplastic chemotherapy: Secondary | ICD-10-CM | POA: Diagnosis not present

## 2016-11-28 DIAGNOSIS — C7800 Secondary malignant neoplasm of unspecified lung: Secondary | ICD-10-CM

## 2016-11-28 DIAGNOSIS — E860 Dehydration: Secondary | ICD-10-CM

## 2016-11-28 DIAGNOSIS — Z79899 Other long term (current) drug therapy: Secondary | ICD-10-CM | POA: Diagnosis not present

## 2016-11-28 LAB — COMPREHENSIVE METABOLIC PANEL
ALK PHOS: 280 U/L — AB (ref 38–126)
ALT: 25 U/L (ref 17–63)
ANION GAP: 8 (ref 5–15)
AST: 47 U/L — ABNORMAL HIGH (ref 15–41)
Albumin: 3.6 g/dL (ref 3.5–5.0)
BILIRUBIN TOTAL: 1.7 mg/dL — AB (ref 0.3–1.2)
BUN: 20 mg/dL (ref 6–20)
CALCIUM: 9 mg/dL (ref 8.9–10.3)
CO2: 23 mmol/L (ref 22–32)
CREATININE: 1.49 mg/dL — AB (ref 0.61–1.24)
Chloride: 104 mmol/L (ref 101–111)
GFR, EST AFRICAN AMERICAN: 57 mL/min — AB (ref >60)
GFR, EST NON AFRICAN AMERICAN: 49 mL/min — AB (ref >60)
Glucose, Bld: 64 mg/dL — ABNORMAL LOW (ref 65–99)
Potassium: 3.8 mmol/L (ref 3.5–5.1)
Sodium: 135 mmol/L (ref 135–145)
TOTAL PROTEIN: 7.2 g/dL (ref 6.5–8.1)

## 2016-11-28 LAB — CBC WITH DIFFERENTIAL/PLATELET
BASOS ABS: 0 10*3/uL (ref 0–0.1)
BASOS PCT: 2 %
EOS PCT: 11 %
Eosinophils Absolute: 0.3 10*3/uL (ref 0–0.7)
HEMATOCRIT: 25.8 % — AB (ref 40.0–52.0)
Hemoglobin: 7.8 g/dL — ABNORMAL LOW (ref 13.0–18.0)
LYMPHS PCT: 28 %
Lymphs Abs: 0.8 10*3/uL — ABNORMAL LOW (ref 1.0–3.6)
MCH: 22.4 pg — AB (ref 26.0–34.0)
MCHC: 30.2 g/dL — AB (ref 32.0–36.0)
MCV: 74.2 fL — AB (ref 80.0–100.0)
MONO ABS: 0.4 10*3/uL (ref 0.2–1.0)
Monocytes Relative: 13 %
Neutro Abs: 1.3 10*3/uL — ABNORMAL LOW (ref 1.4–6.5)
Neutrophils Relative %: 46 %
Platelets: 126 10*3/uL — ABNORMAL LOW (ref 150–440)
RBC: 3.48 MIL/uL — ABNORMAL LOW (ref 4.40–5.90)
RDW: 16.5 % — AB (ref 11.5–14.5)
WBC: 2.9 10*3/uL — AB (ref 3.8–10.6)

## 2016-11-28 MED ORDER — SODIUM CHLORIDE 0.9% FLUSH
10.0000 mL | INTRAVENOUS | Status: DC | PRN
  Filled 2016-11-28: qty 10

## 2016-11-28 MED ORDER — SODIUM CHLORIDE 0.9 % IV SOLN
Freq: Once | INTRAVENOUS | Status: AC
  Administered 2016-11-28: 12:00:00 via INTRAVENOUS
  Filled 2016-11-28: qty 1000

## 2016-11-28 MED ORDER — HEPARIN SOD (PORK) LOCK FLUSH 100 UNIT/ML IV SOLN: 500.0000 [IU] | Freq: Once | INTRAVENOUS | Status: DC

## 2016-11-28 MED ORDER — NIVOLUMAB CHEMO INJECTION 100 MG/10ML
240.0000 mg | Freq: Once | INTRAVENOUS | Status: AC
  Administered 2016-11-28: 240 mg via INTRAVENOUS
  Filled 2016-11-28: qty 24

## 2016-11-28 NOTE — Progress Notes (Signed)
Patient denies any concerns today.  

## 2016-11-29 LAB — THYROID PANEL WITH TSH
FREE THYROXINE INDEX: 1.9 (ref 1.2–4.9)
T3 Uptake Ratio: 29 % (ref 24–39)
T4, Total: 6.7 ug/dL (ref 4.5–12.0)
TSH: 6.32 u[IU]/mL — AB (ref 0.450–4.500)

## 2016-12-03 DIAGNOSIS — R21 Rash and other nonspecific skin eruption: Secondary | ICD-10-CM | POA: Diagnosis not present

## 2016-12-03 DIAGNOSIS — Z6825 Body mass index (BMI) 25.0-25.9, adult: Secondary | ICD-10-CM | POA: Diagnosis not present

## 2016-12-06 DIAGNOSIS — I10 Essential (primary) hypertension: Secondary | ICD-10-CM | POA: Diagnosis not present

## 2016-12-06 DIAGNOSIS — C649 Malignant neoplasm of unspecified kidney, except renal pelvis: Secondary | ICD-10-CM | POA: Diagnosis not present

## 2016-12-06 DIAGNOSIS — E1142 Type 2 diabetes mellitus with diabetic polyneuropathy: Secondary | ICD-10-CM | POA: Diagnosis not present

## 2016-12-06 DIAGNOSIS — G4700 Insomnia, unspecified: Secondary | ICD-10-CM | POA: Diagnosis not present

## 2016-12-06 DIAGNOSIS — Z0000 Encounter for general adult medical examination without abnormal findings: Secondary | ICD-10-CM | POA: Diagnosis not present

## 2016-12-06 DIAGNOSIS — C7801 Secondary malignant neoplasm of right lung: Secondary | ICD-10-CM | POA: Diagnosis not present

## 2016-12-06 DIAGNOSIS — R69 Illness, unspecified: Secondary | ICD-10-CM | POA: Diagnosis not present

## 2016-12-06 DIAGNOSIS — E785 Hyperlipidemia, unspecified: Secondary | ICD-10-CM | POA: Diagnosis not present

## 2016-12-06 DIAGNOSIS — M170 Bilateral primary osteoarthritis of knee: Secondary | ICD-10-CM | POA: Diagnosis not present

## 2016-12-06 DIAGNOSIS — M109 Gout, unspecified: Secondary | ICD-10-CM | POA: Diagnosis not present

## 2016-12-12 ENCOUNTER — Inpatient Hospital Stay: Payer: Medicare HMO | Attending: Oncology

## 2016-12-12 ENCOUNTER — Inpatient Hospital Stay: Payer: Medicare HMO

## 2016-12-12 VITALS — BP 125/77 | HR 65 | Temp 96.5°F | Resp 16

## 2016-12-12 DIAGNOSIS — N183 Chronic kidney disease, stage 3 (moderate): Secondary | ICD-10-CM | POA: Diagnosis not present

## 2016-12-12 DIAGNOSIS — E1142 Type 2 diabetes mellitus with diabetic polyneuropathy: Secondary | ICD-10-CM | POA: Insufficient documentation

## 2016-12-12 DIAGNOSIS — N400 Enlarged prostate without lower urinary tract symptoms: Secondary | ICD-10-CM | POA: Diagnosis not present

## 2016-12-12 DIAGNOSIS — M109 Gout, unspecified: Secondary | ICD-10-CM | POA: Diagnosis not present

## 2016-12-12 DIAGNOSIS — I129 Hypertensive chronic kidney disease with stage 1 through stage 4 chronic kidney disease, or unspecified chronic kidney disease: Secondary | ICD-10-CM | POA: Diagnosis not present

## 2016-12-12 DIAGNOSIS — C7802 Secondary malignant neoplasm of left lung: Principal | ICD-10-CM

## 2016-12-12 DIAGNOSIS — D61818 Other pancytopenia: Secondary | ICD-10-CM | POA: Diagnosis not present

## 2016-12-12 DIAGNOSIS — Z794 Long term (current) use of insulin: Secondary | ICD-10-CM | POA: Insufficient documentation

## 2016-12-12 DIAGNOSIS — E871 Hypo-osmolality and hyponatremia: Secondary | ICD-10-CM | POA: Insufficient documentation

## 2016-12-12 DIAGNOSIS — C7800 Secondary malignant neoplasm of unspecified lung: Secondary | ICD-10-CM | POA: Diagnosis not present

## 2016-12-12 DIAGNOSIS — Z905 Acquired absence of kidney: Secondary | ICD-10-CM | POA: Diagnosis not present

## 2016-12-12 DIAGNOSIS — K219 Gastro-esophageal reflux disease without esophagitis: Secondary | ICD-10-CM | POA: Insufficient documentation

## 2016-12-12 DIAGNOSIS — Z87891 Personal history of nicotine dependence: Secondary | ICD-10-CM | POA: Diagnosis not present

## 2016-12-12 DIAGNOSIS — Z79899 Other long term (current) drug therapy: Secondary | ICD-10-CM | POA: Diagnosis not present

## 2016-12-12 DIAGNOSIS — C642 Malignant neoplasm of left kidney, except renal pelvis: Secondary | ICD-10-CM | POA: Diagnosis not present

## 2016-12-12 DIAGNOSIS — C649 Malignant neoplasm of unspecified kidney, except renal pelvis: Secondary | ICD-10-CM

## 2016-12-12 DIAGNOSIS — Z5111 Encounter for antineoplastic chemotherapy: Secondary | ICD-10-CM | POA: Diagnosis not present

## 2016-12-12 LAB — COMPREHENSIVE METABOLIC PANEL
ALT: 22 U/L (ref 17–63)
AST: 40 U/L (ref 15–41)
Albumin: 3.4 g/dL — ABNORMAL LOW (ref 3.5–5.0)
Alkaline Phosphatase: 320 U/L — ABNORMAL HIGH (ref 38–126)
Anion gap: 6 (ref 5–15)
BILIRUBIN TOTAL: 1.3 mg/dL — AB (ref 0.3–1.2)
BUN: 19 mg/dL (ref 6–20)
CO2: 28 mmol/L (ref 22–32)
Calcium: 8.9 mg/dL (ref 8.9–10.3)
Chloride: 99 mmol/L — ABNORMAL LOW (ref 101–111)
Creatinine, Ser: 1.3 mg/dL — ABNORMAL HIGH (ref 0.61–1.24)
GFR calc Af Amer: >60 mL/min (ref >60)
GFR, EST NON AFRICAN AMERICAN: 58 mL/min — AB (ref >60)
Glucose, Bld: 202 mg/dL — ABNORMAL HIGH (ref 65–99)
POTASSIUM: 5.1 mmol/L (ref 3.5–5.1)
Sodium: 133 mmol/L — ABNORMAL LOW (ref 135–145)
TOTAL PROTEIN: 7.1 g/dL (ref 6.5–8.1)

## 2016-12-12 LAB — CBC WITH DIFFERENTIAL/PLATELET
Basophils Absolute: 0 10*3/uL (ref 0–0.1)
Basophils Relative: 1 %
EOS ABS: 0.1 10*3/uL (ref 0–0.7)
EOS PCT: 7 %
HCT: 26 % — ABNORMAL LOW (ref 40.0–52.0)
Hemoglobin: 7.7 g/dL — ABNORMAL LOW (ref 13.0–18.0)
LYMPHS ABS: 0.5 10*3/uL — AB (ref 1.0–3.6)
LYMPHS PCT: 23 %
MCH: 21.4 pg — AB (ref 26.0–34.0)
MCHC: 29.6 g/dL — ABNORMAL LOW (ref 32.0–36.0)
MCV: 72.4 fL — AB (ref 80.0–100.0)
Monocytes Absolute: 0.3 10*3/uL (ref 0.2–1.0)
Monocytes Relative: 13 %
Neutro Abs: 1.1 10*3/uL — ABNORMAL LOW (ref 1.4–6.5)
Neutrophils Relative %: 56 %
PLATELETS: 108 10*3/uL — AB (ref 150–440)
RBC: 3.59 MIL/uL — AB (ref 4.40–5.90)
RDW: 16.7 % — AB (ref 11.5–14.5)
WBC: 2 10*3/uL — AB (ref 3.8–10.6)

## 2016-12-12 MED ORDER — SODIUM CHLORIDE 0.9 % IV SOLN
Freq: Once | INTRAVENOUS | Status: AC
  Administered 2016-12-12: 14:00:00 via INTRAVENOUS
  Filled 2016-12-12: qty 1000

## 2016-12-12 MED ORDER — NIVOLUMAB CHEMO INJECTION 100 MG/10ML
240.0000 mg | Freq: Once | INTRAVENOUS | Status: AC
  Administered 2016-12-12: 240 mg via INTRAVENOUS
  Filled 2016-12-12: qty 24

## 2016-12-13 LAB — THYROID PANEL WITH TSH
FREE THYROXINE INDEX: 2 (ref 1.2–4.9)
T3 Uptake Ratio: 30 % (ref 24–39)
T4, Total: 6.7 ug/dL (ref 4.5–12.0)
TSH: 5.27 u[IU]/mL — AB (ref 0.450–4.500)

## 2016-12-21 ENCOUNTER — Other Ambulatory Visit: Payer: Self-pay | Admitting: *Deleted

## 2016-12-21 DIAGNOSIS — C7800 Secondary malignant neoplasm of unspecified lung: Principal | ICD-10-CM

## 2016-12-21 DIAGNOSIS — C649 Malignant neoplasm of unspecified kidney, except renal pelvis: Secondary | ICD-10-CM

## 2016-12-25 NOTE — Progress Notes (Signed)
[place]  [phone]) [phone] Fax:(336) [phone]  ID: Rodney Stewart OB: 05-Oct-1955  MR#: [id number]  FMB#:[id number]  Patient Care Team: System, Pcp Not In as PCP - General  CHIEF COMPLAINT: Stage IV renal cell carcinoma with bilateral lung metastases.  INTERVAL HISTORY: Patient returns to clinic today for further evaluation and consideration of cycle 19 of nivolumab. He continues to tolerate his treatments well without significant side effects. He continues to have significant peripheral neuropathy in both feet secondary to diabetes, but has no other neurologic complaints. He continues to have poor blood glucose control, but states this has improved. He denies any fevers, chills, or recent illnesses. He has a good appetite and denies weight loss. He denies any chest pain, shortness of breath, cough, or hemoptysis. He has no nausea, vomiting, constipation, or diarrhea. He has no melena or hematochezia. He has no urinary complaints. Patient offers no further specific complaints today.   REVIEW OF SYSTEMS:   Review of Systems  Constitutional: Negative.  Negative for fever, malaise/fatigue and weight loss.  Eyes: Negative for blurred vision.  Respiratory: Negative for cough and shortness of breath.   Cardiovascular: Negative.  Negative for chest pain and leg swelling.  Gastrointestinal: Negative.  Negative for abdominal pain, constipation, diarrhea, nausea and vomiting.  Genitourinary: Negative.   Musculoskeletal: Negative.   Neurological: Positive for tingling and sensory change. Negative for weakness and headaches.  Psychiatric/Behavioral: Negative.  The patient is not nervous/anxious and does not have insomnia.    As per HPI. Otherwise, a complete review of systems is negative.   PAST MEDICAL HISTORY: Past Medical History:  Diagnosis Date  . Anemia   . BPH (benign prostatic hyperplasia)   . CKD (chronic kidney disease) stage 3, GFR 30-59 ml/min (HCC)   .  Clear cell renal cell carcinoma (HCC)   . Diabetes mellitus without complication ([place])   . Dyspnea    with exertion  . GERD (gastroesophageal reflux disease)   . History of gout   . History of nephrectomy    Left  . Hypertension   . Neuropathy   . Renal cell carcinoma of left kidney (HCC)    mets to lungs  . Umbilical hernia     PAST SURGICAL HISTORY: Past Surgical History:  Procedure Laterality Date  . BACK SURGERY    . ENDOBRONCHIAL ULTRASOUND N/A 10/14/2015   Procedure: ENDOBRONCHIAL ULTRASOUND;  Surgeon: [name], MD;  Location: [facility] ORS;  Service: Pulmonary;  Laterality: N/A;  . EYE SURGERY Bilateral    Cataract Extraction with IOL  . KNEE SURGERY Right   . NEPHRECTOMY      FAMILY HISTORY: Family History  Problem Relation Age of Onset  . Ovarian cancer Mother   . Diabetes Father   . Hypertension Father     ADVANCED DIRECTIVES (Y/N):  N  HEALTH MAINTENANCE: Social History   Tobacco Use  . Smoking status: Former Smoker    Packs/day: 1.00    Types: Cigarettes  . Smokeless tobacco: Never Used  Substance Use Topics  . Alcohol use: Yes    Alcohol/week: 25.2 oz    Types: 42 Cans of beer per week    Comment: 4-5 cans of beer a day  . Drug use: No     Colonoscopy:  PAP:  Bone density:  Lipid panel:  No Known Allergies  Current Outpatient Medications  Medication Sig Dispense Refill  . ALPRAZolam (XANAX) 1 MG tablet Take 1 mg by mouth as needed for sleep.    Marland Kitchen  amLODipine (NORVASC) 10 MG tablet Take 10 mg by mouth daily.     . colchicine 0.6 MG tablet Take 0.6 mg by mouth every other day.     . gabapentin (NEURONTIN) 600 MG tablet Take 600 mg by mouth daily.    . insulin aspart (NOVOLOG) 100 UNIT/ML FlexPen Inject 15 Units into the skin.     . Insulin Detemir (LEVEMIR FLEXTOUCH) 100 UNIT/ML Pen Inject 45 Units into the skin.     Marland Kitchen losartan (COZAAR) 25 MG tablet Take 50 mg by mouth daily.     Marland Kitchen lovastatin (MEVACOR) 20 MG tablet Take 20 mg by  mouth daily.     . Nebivolol HCl 20 MG TABS Take 20 mg by mouth daily.    . ONE TOUCH ULTRA TEST test strip     . tamsulosin (FLOMAX) 0.4 MG CAPS capsule Take 0.4 mg by mouth daily.      No current facility-administered medications for this visit.    Facility-Administered Medications Ordered in Other Visits  Medication Dose Route Frequency Provider Last Rate Last Dose  . nivolumab (OPDIVO) 240 mg in sodium chloride 0.9 % 100 mL chemo infusion  240 mg Intravenous Once Lloyd Huger, MD   Stopped at 12/26/16 1232    OBJECTIVE: Vitals:   12/26/16 1049  BP: 116/74  Resp: 18  Temp: (!) 97.3 F (36.3 C)     Body mass index is 25.57 kg/m.    ECOG FS:0 - Asymptomatic  General: Well-developed, well-nourished, no acute distress. Eyes: Pink conjunctiva, anicteric sclera. Lungs: Clear to auscultation bilaterally. Heart: Regular rate and rhythm. No rubs, murmurs, or gallops. Abdomen: Soft, nontender, nondistended. No organomegaly noted, normoactive bowel sounds. Musculoskeletal: No edema, cyanosis, or clubbing. Neuro: Alert, answering all questions appropriately. Cranial nerves grossly intact. Skin: No rashes or petechiae noted. Psych: Normal affect.      LAB RESULTS:  Lab Results  Component Value Date   NA 130 (L) 12/26/2016   K 4.4 12/26/2016   CL 97 (L) 12/26/2016   CO2 25 12/26/2016   GLUCOSE 82 12/26/2016   BUN 19 12/26/2016   CREATININE 1.45 (H) 12/26/2016   CALCIUM 8.8 (L) 12/26/2016   PROT 7.1 12/26/2016   ALBUMIN 3.3 (L) 12/26/2016   AST 50 (H) 12/26/2016   ALT 24 12/26/2016   ALKPHOS 351 (H) 12/26/2016   BILITOT 1.4 (H) 12/26/2016   GFRNONAA 51 (L) 12/26/2016   GFRAA 59 (L) 12/26/2016    Lab Results  Component Value Date   WBC 2.0 (L) 12/12/2016   NEUTROABS 1.1 (L) 12/12/2016   HGB 7.7 (L) 12/12/2016   HCT 26.0 (L) 12/12/2016   MCV 72.4 (L) 12/12/2016   PLT 108 (L) 12/12/2016     STUDIES: No results found.   Oncology history: Patient underwent  left nephrectomy on May 16, 2012 which revealed a clear cell grade 2 renal cell carcinoma, stage TIIIa, N0, M0. Tumor size of 16 cm. Patient was noted to have renal vein involvement, but no other structures were involved. 0 of 2 lymph nodes were negative for disease. PET scan on October 20, 2015 revealed metastatic disease and patient was initiated on Votrient. This was subsequently discontinued in March 2018 secondary to progression of disease. Patient initiated second line treatment with nivolumab on Monday, April 02, 2016  ASSESSMENT: Stage IV left renal cell carcinoma with metastasis to the lungs  PLAN:    1. Stage IV left renal cell carcinoma with metastasis to the lungs: PET scan results  from October 15, 2016 reviewed independently with continued improvement of disease burden. Proceed with cycle 19 of nivolumab today. Patient will receive a flat dose of 240 mg every 2 weeks until intolerable side effects or progression of disease. Return to clinic in 2 weeks for consideration of cycle 20 and then in 4 weeks for further evaluation and consideration of cycle 21. Will repeat PET scan in February 2018.  2. Pancytopenia: Patient's blood counts are decreased, but essentially stable. Consider bone marrow biopsy in the future to determine an etiology.   3. Renal insufficiency: Patient's creatinine is approximately at his baseline. Continue to monitor closely.  4. Peripheral neuropathy: Likely secondary to diabetes, continue current dose of gabapentin. Patient plans to discuss with his primary care adjusting his medications possibly pursuing Lyrica. Monitor. 5. Hyperbilirubinemia: Patient's bilirubin is 1.4, which appears to be approximately his baseline.  Monitor.   6. Hyperglycemia: Improved. Unrelated to current treatment. Continue close follow-up with primary care. 7.  Hyponatremia: Mild, and essentially stable.  Monitor.  Patient expressed understanding and was in agreement with this plan. He also  understands that He can call clinic at any time with any questions, concerns, or complaints.   Cancer Staging Metastatic renal cell carcinoma to lung [facility]) Staging form: Kidney, AJCC 7th Edition - Clinical stage from 10/12/2015: Stage IV (TX, N0, M1) - Signed by Lloyd Huger, MD on 10/12/2015   Lloyd Huger, MD 12/26/16 12:07 PM

## 2016-12-26 ENCOUNTER — Other Ambulatory Visit: Payer: Self-pay

## 2016-12-26 ENCOUNTER — Inpatient Hospital Stay (HOSPITAL_BASED_OUTPATIENT_CLINIC_OR_DEPARTMENT_OTHER): Payer: Medicare HMO | Admitting: Oncology

## 2016-12-26 ENCOUNTER — Inpatient Hospital Stay: Payer: Medicare HMO

## 2016-12-26 VITALS — BP 116/74 | Temp 97.3°F | Resp 18 | Wt 183.3 lb

## 2016-12-26 DIAGNOSIS — C7800 Secondary malignant neoplasm of unspecified lung: Secondary | ICD-10-CM

## 2016-12-26 DIAGNOSIS — C642 Malignant neoplasm of left kidney, except renal pelvis: Secondary | ICD-10-CM | POA: Diagnosis not present

## 2016-12-26 DIAGNOSIS — C649 Malignant neoplasm of unspecified kidney, except renal pelvis: Secondary | ICD-10-CM

## 2016-12-26 DIAGNOSIS — D61818 Other pancytopenia: Secondary | ICD-10-CM

## 2016-12-26 DIAGNOSIS — C7802 Secondary malignant neoplasm of left lung: Principal | ICD-10-CM

## 2016-12-26 DIAGNOSIS — E871 Hypo-osmolality and hyponatremia: Secondary | ICD-10-CM | POA: Diagnosis not present

## 2016-12-26 DIAGNOSIS — Z79899 Other long term (current) drug therapy: Secondary | ICD-10-CM

## 2016-12-26 DIAGNOSIS — Z5111 Encounter for antineoplastic chemotherapy: Secondary | ICD-10-CM | POA: Diagnosis not present

## 2016-12-26 DIAGNOSIS — E1142 Type 2 diabetes mellitus with diabetic polyneuropathy: Secondary | ICD-10-CM | POA: Diagnosis not present

## 2016-12-26 DIAGNOSIS — I129 Hypertensive chronic kidney disease with stage 1 through stage 4 chronic kidney disease, or unspecified chronic kidney disease: Secondary | ICD-10-CM

## 2016-12-26 DIAGNOSIS — N183 Chronic kidney disease, stage 3 (moderate): Secondary | ICD-10-CM

## 2016-12-26 DIAGNOSIS — K219 Gastro-esophageal reflux disease without esophagitis: Secondary | ICD-10-CM | POA: Diagnosis not present

## 2016-12-26 LAB — COMPREHENSIVE METABOLIC PANEL
ALT: 24 U/L (ref 17–63)
ANION GAP: 8 (ref 5–15)
AST: 50 U/L — ABNORMAL HIGH (ref 15–41)
Albumin: 3.3 g/dL — ABNORMAL LOW (ref 3.5–5.0)
Alkaline Phosphatase: 351 U/L — ABNORMAL HIGH (ref 38–126)
BUN: 19 mg/dL (ref 6–20)
CHLORIDE: 97 mmol/L — AB (ref 101–111)
CO2: 25 mmol/L (ref 22–32)
CREATININE: 1.45 mg/dL — AB (ref 0.61–1.24)
Calcium: 8.8 mg/dL — ABNORMAL LOW (ref 8.9–10.3)
GFR, EST AFRICAN AMERICAN: 59 mL/min — AB (ref >60)
GFR, EST NON AFRICAN AMERICAN: 51 mL/min — AB (ref >60)
Glucose, Bld: 82 mg/dL (ref 65–99)
POTASSIUM: 4.4 mmol/L (ref 3.5–5.1)
Sodium: 130 mmol/L — ABNORMAL LOW (ref 135–145)
Total Bilirubin: 1.4 mg/dL — ABNORMAL HIGH (ref 0.3–1.2)
Total Protein: 7.1 g/dL (ref 6.5–8.1)

## 2016-12-26 MED ORDER — SODIUM CHLORIDE 0.9 % IV SOLN
240.0000 mg | Freq: Once | INTRAVENOUS | Status: AC
  Administered 2016-12-26: 240 mg via INTRAVENOUS
  Filled 2016-12-26 (×2): qty 24

## 2016-12-26 MED ORDER — SODIUM CHLORIDE 0.9 % IV SOLN
Freq: Once | INTRAVENOUS | Status: AC
  Administered 2016-12-26: 11:00:00 via INTRAVENOUS
  Filled 2016-12-26: qty 1000

## 2016-12-26 NOTE — Progress Notes (Signed)
Proceed with Opdivo today per Dr. Grayland Ormond, no need for CBC lab today per Dr. Grayland Ormond, will check on next visit.

## 2017-01-09 ENCOUNTER — Inpatient Hospital Stay: Payer: Medicare HMO

## 2017-01-09 ENCOUNTER — Inpatient Hospital Stay: Payer: Medicare HMO | Attending: Oncology

## 2017-01-09 ENCOUNTER — Other Ambulatory Visit: Payer: Self-pay | Admitting: *Deleted

## 2017-01-09 VITALS — BP 100/65 | HR 64 | Temp 97.9°F | Resp 18 | Wt 185.6 lb

## 2017-01-09 DIAGNOSIS — C649 Malignant neoplasm of unspecified kidney, except renal pelvis: Secondary | ICD-10-CM

## 2017-01-09 DIAGNOSIS — Z87891 Personal history of nicotine dependence: Secondary | ICD-10-CM | POA: Diagnosis not present

## 2017-01-09 DIAGNOSIS — C7802 Secondary malignant neoplasm of left lung: Secondary | ICD-10-CM | POA: Diagnosis not present

## 2017-01-09 DIAGNOSIS — D61818 Other pancytopenia: Secondary | ICD-10-CM | POA: Diagnosis not present

## 2017-01-09 DIAGNOSIS — Z905 Acquired absence of kidney: Secondary | ICD-10-CM | POA: Insufficient documentation

## 2017-01-09 DIAGNOSIS — Z794 Long term (current) use of insulin: Secondary | ICD-10-CM | POA: Insufficient documentation

## 2017-01-09 DIAGNOSIS — C642 Malignant neoplasm of left kidney, except renal pelvis: Secondary | ICD-10-CM | POA: Insufficient documentation

## 2017-01-09 DIAGNOSIS — I129 Hypertensive chronic kidney disease with stage 1 through stage 4 chronic kidney disease, or unspecified chronic kidney disease: Secondary | ICD-10-CM | POA: Diagnosis not present

## 2017-01-09 DIAGNOSIS — E871 Hypo-osmolality and hyponatremia: Secondary | ICD-10-CM | POA: Insufficient documentation

## 2017-01-09 DIAGNOSIS — K219 Gastro-esophageal reflux disease without esophagitis: Secondary | ICD-10-CM | POA: Insufficient documentation

## 2017-01-09 DIAGNOSIS — D509 Iron deficiency anemia, unspecified: Secondary | ICD-10-CM | POA: Diagnosis not present

## 2017-01-09 DIAGNOSIS — E1122 Type 2 diabetes mellitus with diabetic chronic kidney disease: Secondary | ICD-10-CM | POA: Diagnosis not present

## 2017-01-09 DIAGNOSIS — Z5111 Encounter for antineoplastic chemotherapy: Secondary | ICD-10-CM | POA: Insufficient documentation

## 2017-01-09 DIAGNOSIS — N400 Enlarged prostate without lower urinary tract symptoms: Secondary | ICD-10-CM | POA: Diagnosis not present

## 2017-01-09 DIAGNOSIS — N183 Chronic kidney disease, stage 3 (moderate): Secondary | ICD-10-CM | POA: Insufficient documentation

## 2017-01-09 DIAGNOSIS — M109 Gout, unspecified: Secondary | ICD-10-CM | POA: Insufficient documentation

## 2017-01-09 DIAGNOSIS — C7801 Secondary malignant neoplasm of right lung: Secondary | ICD-10-CM | POA: Diagnosis not present

## 2017-01-09 DIAGNOSIS — Z79899 Other long term (current) drug therapy: Secondary | ICD-10-CM | POA: Insufficient documentation

## 2017-01-09 DIAGNOSIS — D649 Anemia, unspecified: Secondary | ICD-10-CM

## 2017-01-09 LAB — CBC WITH DIFFERENTIAL/PLATELET
Basophils Absolute: 0 10*3/uL (ref 0–0.1)
Basophils Relative: 1 %
Eosinophils Absolute: 0.1 10*3/uL (ref 0–0.7)
Eosinophils Relative: 5 %
HEMATOCRIT: 23.2 % — AB (ref 40.0–52.0)
HEMOGLOBIN: 6.8 g/dL — AB (ref 13.0–18.0)
LYMPHS PCT: 17 %
Lymphs Abs: 0.4 10*3/uL — ABNORMAL LOW (ref 1.0–3.6)
MCH: 20.9 pg — ABNORMAL LOW (ref 26.0–34.0)
MCHC: 29.3 g/dL — AB (ref 32.0–36.0)
MCV: 71.4 fL — AB (ref 80.0–100.0)
MONO ABS: 0.2 10*3/uL (ref 0.2–1.0)
MONOS PCT: 11 %
NEUTROS ABS: 1.6 10*3/uL (ref 1.4–6.5)
NEUTROS PCT: 66 %
Platelets: 104 10*3/uL — ABNORMAL LOW (ref 150–440)
RBC: 3.25 MIL/uL — ABNORMAL LOW (ref 4.40–5.90)
RDW: 17.7 % — AB (ref 11.5–14.5)
WBC: 2.4 10*3/uL — ABNORMAL LOW (ref 3.8–10.6)

## 2017-01-09 LAB — COMPREHENSIVE METABOLIC PANEL
ALK PHOS: 349 U/L — AB (ref 38–126)
ALT: 22 U/L (ref 17–63)
AST: 46 U/L — ABNORMAL HIGH (ref 15–41)
Albumin: 3 g/dL — ABNORMAL LOW (ref 3.5–5.0)
Anion gap: 7 (ref 5–15)
BILIRUBIN TOTAL: 1.3 mg/dL — AB (ref 0.3–1.2)
BUN: 15 mg/dL (ref 6–20)
CALCIUM: 8.3 mg/dL — AB (ref 8.9–10.3)
CO2: 25 mmol/L (ref 22–32)
Chloride: 98 mmol/L — ABNORMAL LOW (ref 101–111)
Creatinine, Ser: 1.52 mg/dL — ABNORMAL HIGH (ref 0.61–1.24)
GFR calc Af Amer: 56 mL/min — ABNORMAL LOW (ref >60)
GFR, EST NON AFRICAN AMERICAN: 48 mL/min — AB (ref >60)
Glucose, Bld: 139 mg/dL — ABNORMAL HIGH (ref 65–99)
POTASSIUM: 4.6 mmol/L (ref 3.5–5.1)
Sodium: 130 mmol/L — ABNORMAL LOW (ref 135–145)
TOTAL PROTEIN: 6.7 g/dL (ref 6.5–8.1)

## 2017-01-09 MED ORDER — NIVOLUMAB CHEMO INJECTION 100 MG/10ML
240.0000 mg | Freq: Once | INTRAVENOUS | Status: AC
  Administered 2017-01-09: 240 mg via INTRAVENOUS
  Filled 2017-01-09: qty 24

## 2017-01-09 MED ORDER — SODIUM CHLORIDE 0.9 % IV SOLN
Freq: Once | INTRAVENOUS | Status: AC
  Administered 2017-01-09: 15:00:00 via INTRAVENOUS
  Filled 2017-01-09: qty 1000

## 2017-01-09 NOTE — Progress Notes (Signed)
Hgb: 6.8. Vital signs stable, see flow sheet. Patient states, "I do not have any problems today, I feel fine." MD, Dr. Grayland Ormond, notified via telephone and aware. Per MD order: proceed with scheduled Opdivo treatment today. MD to place order for patient to return to clinic next week for lab work and possible pRBC blood transfusion. Patient informed and verbalized understanding.

## 2017-01-10 LAB — THYROID PANEL WITH TSH
FREE THYROXINE INDEX: 2.1 (ref 1.2–4.9)
T3 Uptake Ratio: 31 % (ref 24–39)
T4, Total: 6.8 ug/dL (ref 4.5–12.0)
TSH: 10.01 u[IU]/mL — AB (ref 0.450–4.500)

## 2017-01-15 ENCOUNTER — Inpatient Hospital Stay: Payer: Medicare HMO

## 2017-01-15 ENCOUNTER — Other Ambulatory Visit: Payer: Self-pay | Admitting: Oncology

## 2017-01-15 DIAGNOSIS — D649 Anemia, unspecified: Secondary | ICD-10-CM

## 2017-01-15 DIAGNOSIS — C649 Malignant neoplasm of unspecified kidney, except renal pelvis: Secondary | ICD-10-CM

## 2017-01-15 DIAGNOSIS — Z5111 Encounter for antineoplastic chemotherapy: Secondary | ICD-10-CM | POA: Diagnosis not present

## 2017-01-15 DIAGNOSIS — C7802 Secondary malignant neoplasm of left lung: Secondary | ICD-10-CM

## 2017-01-15 LAB — CBC WITH DIFFERENTIAL/PLATELET
Basophils Absolute: 0 10*3/uL (ref 0–0.1)
Basophils Relative: 2 %
EOS PCT: 6 %
Eosinophils Absolute: 0.1 10*3/uL (ref 0–0.7)
HCT: 24.7 % — ABNORMAL LOW (ref 40.0–52.0)
HEMOGLOBIN: 7.2 g/dL — AB (ref 13.0–18.0)
LYMPHS ABS: 0.5 10*3/uL — AB (ref 1.0–3.6)
Lymphocytes Relative: 31 %
MCH: 20.7 pg — AB (ref 26.0–34.0)
MCHC: 29.3 g/dL — AB (ref 32.0–36.0)
MCV: 70.5 fL — ABNORMAL LOW (ref 80.0–100.0)
MONOS PCT: 13 %
Monocytes Absolute: 0.2 10*3/uL (ref 0.2–1.0)
Neutro Abs: 0.8 10*3/uL — ABNORMAL LOW (ref 1.4–6.5)
Neutrophils Relative %: 48 %
PLATELETS: 130 10*3/uL — AB (ref 150–440)
RBC: 3.5 MIL/uL — ABNORMAL LOW (ref 4.40–5.90)
RDW: 17.4 % — ABNORMAL HIGH (ref 11.5–14.5)
WBC: 1.6 10*3/uL — ABNORMAL LOW (ref 3.8–10.6)

## 2017-01-15 LAB — COMPREHENSIVE METABOLIC PANEL
ALK PHOS: 317 U/L — AB (ref 38–126)
ALT: 21 U/L (ref 17–63)
ANION GAP: 9 (ref 5–15)
AST: 45 U/L — ABNORMAL HIGH (ref 15–41)
Albumin: 3.2 g/dL — ABNORMAL LOW (ref 3.5–5.0)
BUN: 15 mg/dL (ref 6–20)
CO2: 24 mmol/L (ref 22–32)
Calcium: 8.9 mg/dL (ref 8.9–10.3)
Chloride: 100 mmol/L — ABNORMAL LOW (ref 101–111)
Creatinine, Ser: 1.37 mg/dL — ABNORMAL HIGH (ref 0.61–1.24)
GFR calc non Af Amer: 55 mL/min — ABNORMAL LOW (ref >60)
Glucose, Bld: 98 mg/dL (ref 65–99)
Potassium: 4.3 mmol/L (ref 3.5–5.1)
SODIUM: 133 mmol/L — AB (ref 135–145)
Total Bilirubin: 1.3 mg/dL — ABNORMAL HIGH (ref 0.3–1.2)
Total Protein: 7.3 g/dL (ref 6.5–8.1)

## 2017-01-15 LAB — IRON AND TIBC
IRON: 12 ug/dL — AB (ref 45–182)
SATURATION RATIOS: 3 % — AB (ref 17.9–39.5)
TIBC: 410 ug/dL (ref 250–450)
UIBC: 398 ug/dL

## 2017-01-15 LAB — PREPARE RBC (CROSSMATCH)

## 2017-01-15 LAB — ABO/RH: ABO/RH(D): A POS

## 2017-01-15 LAB — FERRITIN: Ferritin: 12 ng/mL — ABNORMAL LOW (ref 24–336)

## 2017-01-15 MED ORDER — DIPHENHYDRAMINE HCL 50 MG/ML IJ SOLN
25.0000 mg | Freq: Once | INTRAMUSCULAR | Status: AC
  Administered 2017-01-15: 25 mg via INTRAVENOUS
  Filled 2017-01-15: qty 1

## 2017-01-15 MED ORDER — SODIUM CHLORIDE 0.9 % IV SOLN
250.0000 mL | Freq: Once | INTRAVENOUS | Status: AC
  Administered 2017-01-15: 250 mL via INTRAVENOUS
  Filled 2017-01-15: qty 250

## 2017-01-15 MED ORDER — ACETAMINOPHEN 325 MG PO TABS
650.0000 mg | ORAL_TABLET | Freq: Once | ORAL | Status: AC
  Administered 2017-01-15: 650 mg via ORAL
  Filled 2017-01-15: qty 2

## 2017-01-16 LAB — BPAM RBC
BLOOD PRODUCT EXPIRATION DATE: 201901292359
ISSUE DATE / TIME: 201901151120
UNIT TYPE AND RH: 6200

## 2017-01-16 LAB — TYPE AND SCREEN
ABO/RH(D): A POS
Antibody Screen: NEGATIVE
Unit division: 0

## 2017-01-17 DIAGNOSIS — R809 Proteinuria, unspecified: Secondary | ICD-10-CM | POA: Diagnosis not present

## 2017-01-17 DIAGNOSIS — I10 Essential (primary) hypertension: Secondary | ICD-10-CM | POA: Diagnosis not present

## 2017-01-17 DIAGNOSIS — D631 Anemia in chronic kidney disease: Secondary | ICD-10-CM | POA: Diagnosis not present

## 2017-01-17 DIAGNOSIS — N182 Chronic kidney disease, stage 2 (mild): Secondary | ICD-10-CM | POA: Diagnosis not present

## 2017-01-21 NOTE — Progress Notes (Signed)
Rodney Stewart  Telephone:(336) [phone] Fax:(336) [phone]  ID: Diamantina Providence OB: Apr 05, 1955  MR#: [id number]  BWL#:[id number]  Patient Care Team: System, Pcp Not In as PCP - General  CHIEF COMPLAINT: Stage IV renal cell carcinoma with bilateral lung metastases.  INTERVAL HISTORY: Patient returns to clinic today for further evaluation and consideration of cycle 21 of nivolumab. He continues to tolerate his treatments well without significant side effects. He continues to have significant peripheral neuropathy in both feet secondary to diabetes, but has no other neurologic complaints.  He reports his blood glucose control is improved. He denies any fevers, chills, or recent illnesses. He has a good appetite and denies weight loss. He denies any chest pain, shortness of breath, cough, or hemoptysis. He has no nausea, vomiting, constipation, or diarrhea. He has no melena or hematochezia. He has no urinary complaints. Patient offers no further specific complaints today.   REVIEW OF SYSTEMS:   Review of Systems  Constitutional: Negative.  Negative for fever, malaise/fatigue and weight loss.  Eyes: Negative for blurred vision.  Respiratory: Negative for cough and shortness of breath.   Cardiovascular: Negative.  Negative for chest pain and leg swelling.  Gastrointestinal: Negative.  Negative for abdominal pain, constipation, diarrhea, nausea and vomiting.  Genitourinary: Negative.   Musculoskeletal: Negative.   Neurological: Positive for tingling and sensory change. Negative for weakness and headaches.  Psychiatric/Behavioral: Negative.  The patient is not nervous/anxious and does not have insomnia.    As per HPI. Otherwise, a complete review of systems is negative.   PAST MEDICAL HISTORY: Past Medical History:  Diagnosis Date  . Anemia   . BPH (benign prostatic hyperplasia)   . CKD (chronic kidney disease) stage 3, GFR 30-59 ml/min (HCC)   . Clear cell renal cell carcinoma  (HCC)   . Diabetes mellitus without complication ([place])   . Dyspnea    with exertion  . GERD (gastroesophageal reflux disease)   . History of gout   . History of nephrectomy    Left  . Hypertension   . Neuropathy   . Renal cell carcinoma of left kidney (HCC)    mets to lungs  . Umbilical hernia     PAST SURGICAL HISTORY: Past Surgical History:  Procedure Laterality Date  . BACK SURGERY    . ENDOBRONCHIAL ULTRASOUND N/A 10/14/2015   Procedure: ENDOBRONCHIAL ULTRASOUND;  Surgeon: [name], MD;  Location: [facility] ORS;  Service: Pulmonary;  Laterality: N/A;  . EYE SURGERY Bilateral    Cataract Extraction with IOL  . KNEE SURGERY Right   . NEPHRECTOMY      FAMILY HISTORY: Family History  Problem Relation Age of Onset  . Ovarian cancer Mother   . Diabetes Father   . Hypertension Father     ADVANCED DIRECTIVES (Y/N):  N  HEALTH MAINTENANCE: Social History   Tobacco Use  . Smoking status: Former Smoker    Packs/day: 1.00    Types: Cigarettes  . Smokeless tobacco: Never Used  Substance Use Topics  . Alcohol use: Yes    Alcohol/week: 25.2 oz    Types: 42 Cans of beer per week    Comment: 4-5 cans of beer a day  . Drug use: No     Colonoscopy:  PAP:  Bone density:  Lipid panel:  No Known Allergies  Current Outpatient Medications  Medication Sig Dispense Refill  . ALPRAZolam (XANAX) 1 MG tablet Take 1 mg by mouth as needed for sleep.    Marland Kitchen amLODipine (  NORVASC) 10 MG tablet Take 10 mg by mouth daily.     . colchicine 0.6 MG tablet Take 0.6 mg by mouth every other day.     . gabapentin (NEURONTIN) 600 MG tablet Take 600 mg by mouth daily.    . insulin aspart (NOVOLOG) 100 UNIT/ML FlexPen Inject 15 Units into the skin.     . Insulin Detemir (LEVEMIR FLEXTOUCH) 100 UNIT/ML Pen Inject 45 Units into the skin.     Marland Kitchen losartan (COZAAR) 25 MG tablet Take 50 mg by mouth daily.     Marland Kitchen lovastatin (MEVACOR) 20 MG tablet Take 20 mg by mouth daily.     . Nebivolol HCl  20 MG TABS Take 20 mg by mouth daily.    . ONE TOUCH ULTRA TEST test strip     . tamsulosin (FLOMAX) 0.4 MG CAPS capsule Take 0.4 mg by mouth daily.      No current facility-administered medications for this visit.    Facility-Administered Medications Ordered in Other Visits  Medication Dose Route Frequency Provider Last Rate Last Dose  . nivolumab (OPDIVO) 240 mg in sodium chloride 0.9 % 100 mL chemo infusion  240 mg Intravenous Once Lloyd Huger, MD        OBJECTIVE: Vitals:   01/23/17 1119  BP: (!) 146/75  Pulse: 62  Resp: 18  Temp: 98.1 F (36.7 C)     Body mass index is 27.14 kg/m.    ECOG FS:0 - Asymptomatic  General: Well-developed, well-nourished, no acute distress. Eyes: Pink conjunctiva, anicteric sclera. Lungs: Clear to auscultation bilaterally. Heart: Regular rate and rhythm. No rubs, murmurs, or gallops. Abdomen: Soft, nontender, nondistended. No organomegaly noted, normoactive bowel sounds. Musculoskeletal: No edema, cyanosis, or clubbing. Neuro: Alert, answering all questions appropriately. Cranial nerves grossly intact. Skin: No rashes or petechiae noted. Psych: Normal affect.      LAB RESULTS:  Lab Results  Component Value Date   NA 129 (L) 01/23/2017   K 4.7 01/23/2017   CL 99 (L) 01/23/2017   CO2 27 01/23/2017   GLUCOSE 104 (H) 01/23/2017   BUN 16 01/23/2017   CREATININE 1.32 (H) 01/23/2017   CALCIUM 8.6 (L) 01/23/2017   PROT 7.2 01/23/2017   ALBUMIN 3.2 (L) 01/23/2017   AST 48 (H) 01/23/2017   ALT 20 01/23/2017   ALKPHOS 334 (H) 01/23/2017   BILITOT 1.5 (H) 01/23/2017   GFRNONAA 57 (L) 01/23/2017   GFRAA >60 01/23/2017    Lab Results  Component Value Date   WBC 2.0 (L) 01/23/2017   NEUTROABS 0.9 (L) 01/23/2017   HGB 8.6 (L) 01/23/2017   HCT 28.9 (L) 01/23/2017   MCV 73.4 (L) 01/23/2017   PLT 131 (L) 01/23/2017   Lab Results  Component Value Date   IRON 12 (L) 01/15/2017   TIBC 410 01/15/2017   IRONPCTSAT 3 (L) 01/15/2017     Lab Results  Component Value Date   FERRITIN 12 (L) 01/15/2017     STUDIES: No results found.   Oncology history: Patient underwent left nephrectomy on May 16, 2012 which revealed a clear cell grade 2 renal cell carcinoma, stage TIIIa, N0, M0. Tumor size of 16 cm. Patient was noted to have renal vein involvement, but no other structures were involved. 0 of 2 lymph nodes were negative for disease. PET scan on October 20, 2015 revealed metastatic disease and patient was initiated on Votrient. This was subsequently discontinued in March 2018 secondary to progression of disease. Patient initiated second line treatment  with nivolumab on Monday, April 02, 2016  ASSESSMENT: Stage IV left renal cell carcinoma with metastasis to the lungs  PLAN:    1. Stage IV left renal cell carcinoma with metastasis to the lungs: PET scan results from October 15, 2016 reviewed independently with continued improvement of disease burden. Proceed with cycle 21 of nivolumab today. Patient will receive a flat dose of 240 mg every 2 weeks until intolerable side effects or progression of disease. Return to clinic in 2 weeks for consideration of cycle 22 and then in 4 weeks for further evaluation and consideration of cycle 23. Will repeat PET prior to cycle 23 in February 2019. 2. Pancytopenia: Patient's blood counts are decreased, but essentially stable. Consider bone marrow biopsy in the future to determine an etiology.   3. Renal insufficiency: Patient's creatinine is approximately at his baseline. Continue to monitor closely.  4. Peripheral neuropathy: Likely secondary to diabetes, continue current dose of gabapentin. Patient plans to discuss with his primary care adjusting his medications possibly pursuing Lyrica. Monitor. 5. Hyperbilirubinemia: Patient's bilirubin is 1.5, which appears to be approximately his baseline.  Monitor.   6. Hyperglycemia: Improved. Unrelated to current treatment. Continue close follow-up with  primary care. 7.  Hyponatremia: Mild, and essentially stable.  Monitor. 8.  Iron deficiency anemia: Patient received 1 unit of packed red blood cells recently.  Proceed with 510 mg IV Feraheme today.  Patient expressed understanding and was in agreement with this plan. He also understands that He can call clinic at any time with any questions, concerns, or complaints.   Cancer Staging Metastatic renal cell carcinoma to lung [facility]) Staging form: Kidney, AJCC 7th Edition - Clinical stage from 10/12/2015: Stage IV (TX, N0, M1) - Signed by Lloyd Huger, MD on 10/12/2015   Lloyd Huger, MD 01/23/17 12:28 PM

## 2017-01-22 ENCOUNTER — Other Ambulatory Visit: Payer: Self-pay | Admitting: Oncology

## 2017-01-23 ENCOUNTER — Inpatient Hospital Stay: Payer: Medicare HMO

## 2017-01-23 ENCOUNTER — Other Ambulatory Visit: Payer: Self-pay

## 2017-01-23 ENCOUNTER — Inpatient Hospital Stay (HOSPITAL_BASED_OUTPATIENT_CLINIC_OR_DEPARTMENT_OTHER): Payer: Medicare HMO | Admitting: Oncology

## 2017-01-23 VITALS — BP 146/75 | HR 62 | Temp 98.1°F | Resp 18 | Wt 194.6 lb

## 2017-01-23 DIAGNOSIS — D508 Other iron deficiency anemias: Secondary | ICD-10-CM

## 2017-01-23 DIAGNOSIS — D61818 Other pancytopenia: Secondary | ICD-10-CM | POA: Diagnosis not present

## 2017-01-23 DIAGNOSIS — Z79899 Other long term (current) drug therapy: Secondary | ICD-10-CM

## 2017-01-23 DIAGNOSIS — C7802 Secondary malignant neoplasm of left lung: Principal | ICD-10-CM

## 2017-01-23 DIAGNOSIS — C642 Malignant neoplasm of left kidney, except renal pelvis: Secondary | ICD-10-CM

## 2017-01-23 DIAGNOSIS — Z5111 Encounter for antineoplastic chemotherapy: Secondary | ICD-10-CM | POA: Diagnosis not present

## 2017-01-23 DIAGNOSIS — C649 Malignant neoplasm of unspecified kidney, except renal pelvis: Secondary | ICD-10-CM

## 2017-01-23 DIAGNOSIS — E871 Hypo-osmolality and hyponatremia: Secondary | ICD-10-CM

## 2017-01-23 DIAGNOSIS — D509 Iron deficiency anemia, unspecified: Secondary | ICD-10-CM | POA: Insufficient documentation

## 2017-01-23 DIAGNOSIS — C7801 Secondary malignant neoplasm of right lung: Secondary | ICD-10-CM | POA: Diagnosis not present

## 2017-01-23 LAB — COMPREHENSIVE METABOLIC PANEL
ALT: 20 U/L (ref 17–63)
AST: 48 U/L — AB (ref 15–41)
Albumin: 3.2 g/dL — ABNORMAL LOW (ref 3.5–5.0)
Alkaline Phosphatase: 334 U/L — ABNORMAL HIGH (ref 38–126)
Anion gap: 3 — ABNORMAL LOW (ref 5–15)
BUN: 16 mg/dL (ref 6–20)
CHLORIDE: 99 mmol/L — AB (ref 101–111)
CO2: 27 mmol/L (ref 22–32)
Calcium: 8.6 mg/dL — ABNORMAL LOW (ref 8.9–10.3)
Creatinine, Ser: 1.32 mg/dL — ABNORMAL HIGH (ref 0.61–1.24)
GFR, EST NON AFRICAN AMERICAN: 57 mL/min — AB (ref >60)
Glucose, Bld: 104 mg/dL — ABNORMAL HIGH (ref 65–99)
POTASSIUM: 4.7 mmol/L (ref 3.5–5.1)
Sodium: 129 mmol/L — ABNORMAL LOW (ref 135–145)
Total Bilirubin: 1.5 mg/dL — ABNORMAL HIGH (ref 0.3–1.2)
Total Protein: 7.2 g/dL (ref 6.5–8.1)

## 2017-01-23 LAB — CBC WITH DIFFERENTIAL/PLATELET
Basophils Absolute: 0 10*3/uL (ref 0–0.1)
Basophils Relative: 1 %
EOS ABS: 0.2 10*3/uL (ref 0–0.7)
Eosinophils Relative: 10 %
HEMATOCRIT: 28.9 % — AB (ref 40.0–52.0)
HEMOGLOBIN: 8.6 g/dL — AB (ref 13.0–18.0)
LYMPHS ABS: 0.6 10*3/uL — AB (ref 1.0–3.6)
LYMPHS PCT: 29 %
MCH: 21.8 pg — AB (ref 26.0–34.0)
MCHC: 29.7 g/dL — AB (ref 32.0–36.0)
MCV: 73.4 fL — ABNORMAL LOW (ref 80.0–100.0)
MONOS PCT: 15 %
Monocytes Absolute: 0.3 10*3/uL (ref 0.2–1.0)
NEUTROS ABS: 0.9 10*3/uL — AB (ref 1.4–6.5)
NEUTROS PCT: 45 %
Platelets: 131 10*3/uL — ABNORMAL LOW (ref 150–440)
RBC: 3.94 MIL/uL — AB (ref 4.40–5.90)
RDW: 20.1 % — ABNORMAL HIGH (ref 11.5–14.5)
WBC: 2 10*3/uL — AB (ref 3.8–10.6)

## 2017-01-23 MED ORDER — SODIUM CHLORIDE 0.9 % IV SOLN
Freq: Once | INTRAVENOUS | Status: AC
  Administered 2017-01-23: 12:00:00 via INTRAVENOUS
  Filled 2017-01-23: qty 1000

## 2017-01-23 MED ORDER — SODIUM CHLORIDE 0.9 % IV SOLN
510.0000 mg | Freq: Once | INTRAVENOUS | Status: AC
  Administered 2017-01-23: 510 mg via INTRAVENOUS
  Filled 2017-01-23: qty 17

## 2017-01-23 MED ORDER — SODIUM CHLORIDE 0.9 % IV SOLN
240.0000 mg | Freq: Once | INTRAVENOUS | Status: AC
  Administered 2017-01-23: 240 mg via INTRAVENOUS
  Filled 2017-01-23: qty 24

## 2017-01-23 NOTE — Progress Notes (Signed)
Patient denies any concerns today.  

## 2017-01-25 ENCOUNTER — Other Ambulatory Visit: Payer: Self-pay

## 2017-01-25 ENCOUNTER — Inpatient Hospital Stay
Admission: EM | Admit: 2017-01-25 | Discharge: 2017-01-27 | DRG: 194 | Disposition: A | Discharge status: Home / Self Care | Payer: Medicare HMO | Attending: Internal Medicine | Admitting: Internal Medicine

## 2017-01-25 ENCOUNTER — Emergency Department: Payer: Medicare HMO

## 2017-01-25 ENCOUNTER — Encounter: Payer: Self-pay | Admitting: Emergency Medicine

## 2017-01-25 DIAGNOSIS — Z9842 Cataract extraction status, left eye: Secondary | ICD-10-CM

## 2017-01-25 DIAGNOSIS — E1122 Type 2 diabetes mellitus with diabetic chronic kidney disease: Secondary | ICD-10-CM | POA: Diagnosis not present

## 2017-01-25 DIAGNOSIS — C7801 Secondary malignant neoplasm of right lung: Secondary | ICD-10-CM

## 2017-01-25 DIAGNOSIS — C7802 Secondary malignant neoplasm of left lung: Secondary | ICD-10-CM | POA: Diagnosis present

## 2017-01-25 DIAGNOSIS — R0602 Shortness of breath: Secondary | ICD-10-CM | POA: Diagnosis not present

## 2017-01-25 DIAGNOSIS — Z7982 Long term (current) use of aspirin: Secondary | ICD-10-CM | POA: Diagnosis not present

## 2017-01-25 DIAGNOSIS — D61818 Other pancytopenia: Secondary | ICD-10-CM | POA: Diagnosis present

## 2017-01-25 DIAGNOSIS — Z79899 Other long term (current) drug therapy: Secondary | ICD-10-CM

## 2017-01-25 DIAGNOSIS — D696 Thrombocytopenia, unspecified: Secondary | ICD-10-CM

## 2017-01-25 DIAGNOSIS — Z905 Acquired absence of kidney: Secondary | ICD-10-CM

## 2017-01-25 DIAGNOSIS — C7902 Secondary malignant neoplasm of left kidney and renal pelvis: Secondary | ICD-10-CM | POA: Diagnosis not present

## 2017-01-25 DIAGNOSIS — Z8249 Family history of ischemic heart disease and other diseases of the circulatory system: Secondary | ICD-10-CM

## 2017-01-25 DIAGNOSIS — M109 Gout, unspecified: Secondary | ICD-10-CM | POA: Diagnosis present

## 2017-01-25 DIAGNOSIS — C649 Malignant neoplasm of unspecified kidney, except renal pelvis: Secondary | ICD-10-CM | POA: Diagnosis not present

## 2017-01-25 DIAGNOSIS — Z961 Presence of intraocular lens: Secondary | ICD-10-CM | POA: Diagnosis present

## 2017-01-25 DIAGNOSIS — C642 Malignant neoplasm of left kidney, except renal pelvis: Secondary | ICD-10-CM | POA: Diagnosis not present

## 2017-01-25 DIAGNOSIS — D509 Iron deficiency anemia, unspecified: Secondary | ICD-10-CM | POA: Diagnosis not present

## 2017-01-25 DIAGNOSIS — I129 Hypertensive chronic kidney disease with stage 1 through stage 4 chronic kidney disease, or unspecified chronic kidney disease: Secondary | ICD-10-CM | POA: Diagnosis not present

## 2017-01-25 DIAGNOSIS — J101 Influenza due to other identified influenza virus with other respiratory manifestations: Secondary | ICD-10-CM | POA: Diagnosis not present

## 2017-01-25 DIAGNOSIS — Z87891 Personal history of nicotine dependence: Secondary | ICD-10-CM | POA: Diagnosis not present

## 2017-01-25 DIAGNOSIS — D703 Neutropenia due to infection: Secondary | ICD-10-CM

## 2017-01-25 DIAGNOSIS — E119 Type 2 diabetes mellitus without complications: Secondary | ICD-10-CM | POA: Diagnosis not present

## 2017-01-25 DIAGNOSIS — E1140 Type 2 diabetes mellitus with diabetic neuropathy, unspecified: Secondary | ICD-10-CM | POA: Diagnosis present

## 2017-01-25 DIAGNOSIS — N183 Chronic kidney disease, stage 3 (moderate): Secondary | ICD-10-CM | POA: Diagnosis not present

## 2017-01-25 DIAGNOSIS — Z9221 Personal history of antineoplastic chemotherapy: Secondary | ICD-10-CM

## 2017-01-25 DIAGNOSIS — Z833 Family history of diabetes mellitus: Secondary | ICD-10-CM

## 2017-01-25 DIAGNOSIS — Z9841 Cataract extraction status, right eye: Secondary | ICD-10-CM

## 2017-01-25 DIAGNOSIS — Z85528 Personal history of other malignant neoplasm of kidney: Secondary | ICD-10-CM | POA: Diagnosis not present

## 2017-01-25 DIAGNOSIS — Z794 Long term (current) use of insulin: Secondary | ICD-10-CM | POA: Diagnosis not present

## 2017-01-25 DIAGNOSIS — E871 Hypo-osmolality and hyponatremia: Secondary | ICD-10-CM | POA: Diagnosis not present

## 2017-01-25 DIAGNOSIS — R05 Cough: Secondary | ICD-10-CM | POA: Diagnosis not present

## 2017-01-25 DIAGNOSIS — N400 Enlarged prostate without lower urinary tract symptoms: Secondary | ICD-10-CM | POA: Diagnosis present

## 2017-01-25 DIAGNOSIS — Z8041 Family history of malignant neoplasm of ovary: Secondary | ICD-10-CM

## 2017-01-25 DIAGNOSIS — K219 Gastro-esophageal reflux disease without esophagitis: Secondary | ICD-10-CM

## 2017-01-25 DIAGNOSIS — I10 Essential (primary) hypertension: Secondary | ICD-10-CM | POA: Diagnosis not present

## 2017-01-25 LAB — COMPREHENSIVE METABOLIC PANEL
ALBUMIN: 3.1 g/dL — AB (ref 3.5–5.0)
ALT: 27 U/L (ref 17–63)
ANION GAP: 9 (ref 5–15)
AST: 107 U/L — ABNORMAL HIGH (ref 15–41)
Alkaline Phosphatase: 361 U/L — ABNORMAL HIGH (ref 38–126)
BUN: 12 mg/dL (ref 6–20)
CO2: 23 mmol/L (ref 22–32)
Calcium: 8.7 mg/dL — ABNORMAL LOW (ref 8.9–10.3)
Chloride: 100 mmol/L — ABNORMAL LOW (ref 101–111)
Creatinine, Ser: 1.35 mg/dL — ABNORMAL HIGH (ref 0.61–1.24)
GFR calc Af Amer: >60 mL/min (ref >60)
GFR, EST NON AFRICAN AMERICAN: 56 mL/min — AB (ref >60)
Glucose, Bld: 201 mg/dL — ABNORMAL HIGH (ref 65–99)
POTASSIUM: 4.6 mmol/L (ref 3.5–5.1)
Sodium: 132 mmol/L — ABNORMAL LOW (ref 135–145)
Total Bilirubin: 2.6 mg/dL — ABNORMAL HIGH (ref 0.3–1.2)
Total Protein: 6.5 g/dL (ref 6.5–8.1)

## 2017-01-25 LAB — BRAIN NATRIURETIC PEPTIDE: B NATRIURETIC PEPTIDE 5: 456 pg/mL — AB (ref 0.0–100.0)

## 2017-01-25 LAB — URINALYSIS, COMPLETE (UACMP) WITH MICROSCOPIC
Bacteria, UA: NONE SEEN
Bilirubin Urine: NEGATIVE
HGB URINE DIPSTICK: NEGATIVE
Ketones, ur: NEGATIVE mg/dL
Leukocytes, UA: NEGATIVE
NITRITE: NEGATIVE
PH: 5 (ref 5.0–8.0)
Protein, ur: NEGATIVE mg/dL
SPECIFIC GRAVITY, URINE: 1.016 (ref 1.005–1.030)
Squamous Epithelial / LPF: NONE SEEN

## 2017-01-25 LAB — BLOOD GAS, VENOUS
ACID-BASE EXCESS: 1.2 mmol/L (ref 0.0–2.0)
BICARBONATE: 26.6 mmol/L (ref 20.0–28.0)
O2 Saturation: 60.1 %
PCO2 VEN: 45 mmHg (ref 44.0–60.0)
PO2 VEN: 32 mmHg (ref 32.0–45.0)
Patient temperature: 37
pH, Ven: 7.38 (ref 7.250–7.430)

## 2017-01-25 LAB — DIFFERENTIAL
BASOS PCT: 2 %
Basophils Absolute: 0 10*3/uL (ref 0–0.1)
EOS PCT: 7 %
Eosinophils Absolute: 0.1 10*3/uL (ref 0–0.7)
LYMPHS PCT: 39 %
Lymphs Abs: 0.3 10*3/uL — ABNORMAL LOW (ref 1.0–3.6)
Monocytes Absolute: 0.2 10*3/uL (ref 0.2–1.0)
Monocytes Relative: 26 %
Neutro Abs: 0.2 10*3/uL — ABNORMAL LOW (ref 1.4–6.5)
Neutrophils Relative %: 26 %

## 2017-01-25 LAB — INFLUENZA PANEL BY PCR (TYPE A & B)
INFLBPCR: NEGATIVE
Influenza A By PCR: POSITIVE — AB

## 2017-01-25 LAB — GLUCOSE, CAPILLARY
Glucose-Capillary: 293 mg/dL — ABNORMAL HIGH (ref 65–99)
Glucose-Capillary: 356 mg/dL — ABNORMAL HIGH (ref 65–99)
Glucose-Capillary: 250 mg/dL — ABNORMAL HIGH (ref 65–99)

## 2017-01-25 LAB — CBC
HEMATOCRIT: 27.5 % — AB (ref 40.0–52.0)
Hemoglobin: 8.1 g/dL — ABNORMAL LOW (ref 13.0–18.0)
MCH: 22 pg — ABNORMAL LOW (ref 26.0–34.0)
MCHC: 29.5 g/dL — AB (ref 32.0–36.0)
MCV: 74.7 fL — AB (ref 80.0–100.0)
PLATELETS: 83 10*3/uL — AB (ref 150–440)
RBC: 3.69 MIL/uL — ABNORMAL LOW (ref 4.40–5.90)
RDW: 21.2 % — AB (ref 11.5–14.5)
WBC: 0.8 10*3/uL — CL (ref 3.8–10.6)

## 2017-01-25 LAB — LACTIC ACID, PLASMA
Lactic Acid, Venous: 1.6 mmol/L (ref 0.5–1.9)
Lactic Acid, Venous: 3.1 mmol/L (ref 0.5–1.9)

## 2017-01-25 LAB — TROPONIN I: Troponin I: <0.03 ng/mL (ref <0.03)

## 2017-01-25 MED ORDER — BISACODYL 10 MG RE SUPP: 10.0000 mg | Freq: Every day | RECTAL | Status: DC | PRN

## 2017-01-25 MED ORDER — OSELTAMIVIR PHOSPHATE 75 MG PO CAPS
75.0000 mg | ORAL_CAPSULE | Freq: Two times a day (BID) | ORAL | Status: DC
  Administered 2017-01-25 – 2017-01-27 (×5): 75 mg via ORAL
  Filled 2017-01-25 (×6): qty 1

## 2017-01-25 MED ORDER — GUAIFENESIN-DM 100-10 MG/5ML PO SYRP
5.0000 mL | ORAL_SOLUTION | ORAL | Status: DC | PRN
  Administered 2017-01-25 (×3): 5 mL via ORAL
  Filled 2017-01-25 (×5): qty 5

## 2017-01-25 MED ORDER — LORAZEPAM 2 MG/ML IJ SOLN
1.0000 mg | INTRAMUSCULAR | Status: AC
  Administered 2017-01-25: 1 mg via INTRAVENOUS

## 2017-01-25 MED ORDER — INSULIN DETEMIR 100 UNIT/ML ~~LOC~~ SOLN
45.0000 [IU] | Freq: Every day | SUBCUTANEOUS | Status: DC
  Filled 2017-01-25: qty 0.45

## 2017-01-25 MED ORDER — LORAZEPAM 2 MG/ML IJ SOLN
INTRAMUSCULAR | Status: AC
  Filled 2017-01-25: qty 1

## 2017-01-25 MED ORDER — ALPRAZOLAM 1 MG PO TABS
1.0000 mg | ORAL_TABLET | Freq: Every evening | ORAL | Status: DC | PRN
  Administered 2017-01-25: 1 mg via ORAL
  Filled 2017-01-25: qty 1

## 2017-01-25 MED ORDER — COLCHICINE 0.6 MG PO TABS
0.6000 mg | ORAL_TABLET | ORAL | Status: DC
  Administered 2017-01-26: 0.6 mg via ORAL
  Filled 2017-01-25: qty 1

## 2017-01-25 MED ORDER — LOSARTAN POTASSIUM 50 MG PO TABS
25.0000 mg | ORAL_TABLET | Freq: Every day | ORAL | Status: DC
  Administered 2017-01-25 – 2017-01-27 (×3): 25 mg via ORAL
  Filled 2017-01-25 (×3): qty 1

## 2017-01-25 MED ORDER — NEBIVOLOL HCL 5 MG PO TABS
20.0000 mg | ORAL_TABLET | Freq: Every day | ORAL | Status: DC
  Administered 2017-01-25 – 2017-01-27 (×3): 20 mg via ORAL
  Filled 2017-01-25 (×3): qty 4

## 2017-01-25 MED ORDER — INSULIN ASPART 100 UNIT/ML ~~LOC~~ SOLN: 15.0000 [IU] | Freq: Every day | SUBCUTANEOUS | Status: DC

## 2017-01-25 MED ORDER — POLYETHYLENE GLYCOL 3350 17 G PO PACK: 17.0000 g | PACK | Freq: Every day | ORAL | Status: DC | PRN

## 2017-01-25 MED ORDER — SODIUM CHLORIDE 0.9 % IV SOLN
INTRAVENOUS | Status: DC
  Administered 2017-01-25 – 2017-01-27 (×2): via INTRAVENOUS

## 2017-01-25 MED ORDER — OSELTAMIVIR PHOSPHATE 75 MG PO CAPS
75.0000 mg | ORAL_CAPSULE | Freq: Once | ORAL | Status: AC
  Administered 2017-01-25: 75 mg via ORAL
  Filled 2017-01-25: qty 1

## 2017-01-25 MED ORDER — GABAPENTIN 100 MG PO CAPS
200.0000 mg | ORAL_CAPSULE | Freq: Three times a day (TID) | ORAL | Status: DC
  Administered 2017-01-25 – 2017-01-27 (×7): 200 mg via ORAL
  Filled 2017-01-25 (×7): qty 2

## 2017-01-25 MED ORDER — ALBUTEROL SULFATE (2.5 MG/3ML) 0.083% IN NEBU
5.0000 mg | INHALATION_SOLUTION | Freq: Once | RESPIRATORY_TRACT | Status: AC
  Administered 2017-01-25: 5 mg via RESPIRATORY_TRACT
  Filled 2017-01-25: qty 6

## 2017-01-25 MED ORDER — ACETAMINOPHEN 325 MG PO TABS: 325.0000 mg | ORAL_TABLET | Freq: Four times a day (QID) | ORAL | Status: DC | PRN

## 2017-01-25 MED ORDER — CIPROFLOXACIN HCL 500 MG PO TABS
500.0000 mg | ORAL_TABLET | Freq: Every day | ORAL | Status: DC
  Administered 2017-01-25 – 2017-01-27 (×3): 500 mg via ORAL
  Filled 2017-01-25 (×3): qty 1

## 2017-01-25 MED ORDER — PRAVASTATIN SODIUM 20 MG PO TABS
20.0000 mg | ORAL_TABLET | Freq: Every day | ORAL | Status: DC
  Administered 2017-01-25 – 2017-01-26 (×2): 20 mg via ORAL
  Filled 2017-01-25 (×2): qty 1

## 2017-01-25 MED ORDER — TAMSULOSIN HCL 0.4 MG PO CAPS
0.4000 mg | ORAL_CAPSULE | ORAL | Status: DC
  Administered 2017-01-25 – 2017-01-27 (×2): 0.4 mg via ORAL
  Filled 2017-01-25 (×2): qty 1

## 2017-01-25 MED ORDER — ASPIRIN 81 MG PO CHEW
81.0000 mg | CHEWABLE_TABLET | Freq: Every day | ORAL | Status: DC
  Administered 2017-01-25 – 2017-01-27 (×3): 81 mg via ORAL
  Filled 2017-01-25 (×3): qty 1

## 2017-01-25 MED ORDER — BENZONATATE 100 MG PO CAPS
100.0000 mg | ORAL_CAPSULE | Freq: Three times a day (TID) | ORAL | Status: DC
  Administered 2017-01-25 – 2017-01-27 (×6): 100 mg via ORAL
  Filled 2017-01-25 (×6): qty 1

## 2017-01-25 MED ORDER — INSULIN ASPART 100 UNIT/ML ~~LOC~~ SOLN
0.0000 [IU] | Freq: Three times a day (TID) | SUBCUTANEOUS | Status: DC
  Administered 2017-01-25: 18:00:00 15 [IU] via SUBCUTANEOUS
  Administered 2017-01-26: 12:00:00 1 [IU] via SUBCUTANEOUS
  Filled 2017-01-25 (×3): qty 1

## 2017-01-25 MED ORDER — INSULIN ASPART 100 UNIT/ML ~~LOC~~ SOLN
10.0000 [IU] | Freq: Every day | SUBCUTANEOUS | Status: DC
  Administered 2017-01-26: 10 [IU] via SUBCUTANEOUS
  Filled 2017-01-25: qty 1

## 2017-01-25 MED ORDER — AMLODIPINE BESYLATE 5 MG PO TABS
10.0000 mg | ORAL_TABLET | Freq: Every day | ORAL | Status: DC
  Administered 2017-01-25 – 2017-01-27 (×3): 10 mg via ORAL
  Filled 2017-01-25 (×3): qty 2

## 2017-01-25 MED ORDER — ONDANSETRON HCL 4 MG PO TABS: 4.0000 mg | ORAL_TABLET | Freq: Four times a day (QID) | ORAL | Status: DC | PRN

## 2017-01-25 MED ORDER — LEVOFLOXACIN IN D5W 750 MG/150ML IV SOLN
750.0000 mg | Freq: Once | INTRAVENOUS | Status: AC
  Administered 2017-01-25: 750 mg via INTRAVENOUS
  Filled 2017-01-25: qty 150

## 2017-01-25 MED ORDER — OXYCODONE HCL 5 MG PO TABS: 5.0000 mg | ORAL_TABLET | ORAL | Status: DC | PRN

## 2017-01-25 MED ORDER — ONDANSETRON HCL 4 MG/2ML IJ SOLN: 4.0000 mg | Freq: Four times a day (QID) | INTRAMUSCULAR | Status: DC | PRN

## 2017-01-25 MED ORDER — INSULIN DETEMIR 100 UNIT/ML ~~LOC~~ SOLN
35.0000 [IU] | Freq: Every day | SUBCUTANEOUS | Status: DC
  Administered 2017-01-25 – 2017-01-26 (×2): 35 [IU] via SUBCUTANEOUS
  Filled 2017-01-25 (×3): qty 0.35

## 2017-01-25 MED ORDER — INSULIN ASPART 100 UNIT/ML ~~LOC~~ SOLN
0.0000 [IU] | Freq: Three times a day (TID) | SUBCUTANEOUS | Status: DC
  Administered 2017-01-25: 12:00:00 5 [IU] via SUBCUTANEOUS
  Filled 2017-01-25: qty 1

## 2017-01-25 NOTE — ED Notes (Signed)
Swelling noted to left forearm from unsuccessful IV attempt. Ice pack applied to site.

## 2017-01-25 NOTE — ED Provider Notes (Signed)
[facility] Emergency Department Provider Note  ____________________________________________  Time seen: Approximately 8:30 AM  I have reviewed the triage vital signs and the nursing notes.   HISTORY  Chief Complaint Shortness of Breath    HPI Rodney Stewart is a 62 y.o. male with a history of renal cell carcinoma metastatic to the lungs, last chemo 2 days ago, status post left nephrectomy, DM and HTN, presenting for cough, general malaise, and shortness of breath.  The patient reports that since yesterday he has had a cough productive of thick phlegm without associated sore throat, ear pain, fever or chills.  He has had some mild shortness of breath, worse with exertion.  He has not had any chest pain, lower extremity edema or calf pain.  No nausea vomiting or diarrhea.  Past Medical History:  Diagnosis Date  . Anemia   . BPH (benign prostatic hyperplasia)   . CKD (chronic kidney disease) stage 3, GFR 30-59 ml/min (HCC)   . Clear cell renal cell carcinoma (HCC)   . Diabetes mellitus without complication ([place])   . Dyspnea    with exertion  . GERD (gastroesophageal reflux disease)   . History of gout   . History of nephrectomy    Left  . Hypertension   . Neuropathy   . Renal cell carcinoma of left kidney (HCC)    mets to lungs  . Umbilical hernia     Patient Active Problem List   Diagnosis Date Noted  . Iron deficiency anemia 01/23/2017  . Goals of care, counseling/discussion 11/27/2016  . Metastatic renal cell carcinoma to lung ([place])   . ARF (acute renal failure) ([place]) 09/30/2015  . Type 2 diabetes mellitus with moderate nonproliferative diabetic retinopathy and without macular edema ([place]) 09/17/2014  . Cataract 08/20/2014  . Hypertension 07/02/2014  . CKD (chronic kidney disease) stage 3, GFR 30-59 ml/min (HCC) 11/30/2013  . Single kidney 02/04/2013    Past Surgical History:  Procedure Laterality Date  . BACK SURGERY    . ENDOBRONCHIAL  ULTRASOUND N/A 10/14/2015   Procedure: ENDOBRONCHIAL ULTRASOUND;  Surgeon: [name], MD;  Location: [facility] ORS;  Service: Pulmonary;  Laterality: N/A;  . EYE SURGERY Bilateral    Cataract Extraction with IOL  . KNEE SURGERY Right   . NEPHRECTOMY      Current Outpatient Rx  . Order #: [id number] Class: Historical Med  . Order #: [id number] Class: Historical Med  . Order #: [id number] Class: Historical Med  . Order #: [id number] Class: Historical Med  . Order #: [id number] Class: Historical Med  . Order #: [id number] Class: Historical Med  . Order #: [id number] Class: Historical Med  . Order #: [id number] Class: Historical Med  . Order #: [id number] Class: Historical Med  . Order #: [id number] Class: Historical Med  . Order #: [id number] Class: Historical Med    Allergies Patient has no known allergies.  Family History  Problem Relation Age of Onset  . Ovarian cancer Mother   . Diabetes Father   . Hypertension Father     Social History Social History   Tobacco Use  . Smoking status: Former Smoker    Packs/day: 1.00    Types: Cigarettes  . Smokeless tobacco: Never Used  Substance Use Topics  . Alcohol use: Yes    Alcohol/week: 25.2 oz    Types: 42 Cans of beer per week    Comment: 4-5 cans of beer a day  . Drug use: No    Review of Systems Constitutional: No fever/chills.  Positive general malaise. Eyes:  No visual changes. ENT: No sore throat. No congestion or rhinorrhea.  No ear pain. Cardiovascular: Denies chest pain. Denies palpitations. Respiratory: Positive shortness of breath.  Positive productive cough. Gastrointestinal: No abdominal pain.  No nausea, no vomiting.  No diarrhea.  No constipation.  Positive chronic abdominal distention. Genitourinary: Negative for dysuria. Musculoskeletal: Negative for back pain. Skin: Negative for rash. Neurological: Negative for headaches. No focal numbness, tingling or weakness.      ____________________________________________   PHYSICAL EXAM:  VITAL SIGNS: ED Triage Vitals  Enc Vitals Group     BP 01/25/17 0807 (!) 143/86     Pulse Rate 01/25/17 0807 78     Resp 01/25/17 0807 18     Temp 01/25/17 0807 97.7 F (36.5 C)     Temp Source 01/25/17 0807 Oral     SpO2 01/25/17 0807 99 %     Weight 01/25/17 0804 194 lb (88 kg)     Height 01/25/17 0804 5\' 11"  (1.803 m)     Head Circumference --      Peak Flow --      Pain Score 01/25/17 0804 0     Pain Loc --      Pain Edu? --      Excl. in [place]? --     Constitutional: Alert and oriented.  Chronically ill appearing but in no acute distress. Answers questions appropriately. Eyes: Conjunctivae are normal.  EOMI. No scleral icterus.  No eye discharge. Head: Atraumatic. Nose: No congestion/rhinnorhea. Mouth/Throat: Mucous membranes are mildly dry.  Neck: No stridor.  Supple.  No JVD.  No meningismus. Cardiovascular: Normal rate, regular rhythm. No murmurs, rubs or gallops.  Respiratory: The patient is tachypneic with accessory muscle use and retractions.  He is able to speak in 3-4 word sentences without distress.  He has diffuse rales worst in the right lung base, with minimal end expiratory wheezing. Gastrointestinal: Soft, nontender.  The patient has significant distention with fluid wave.  He has a large umbilical hernia that is reducible without pain.  No guarding or rebound.  No peritoneal signs. Musculoskeletal: No LE edema.  Neurologic:  A&Ox3.  Speech is clear.  Face and smile are symmetric.  EOMI.  Moves all extremities well. Skin:  Skin is warm, dry and intact. No rash noted. Psychiatric: Mood and affect are normal. Speech and behavior are normal.  Normal judgement. ____________________________________________   LABS (all labs ordered are listed, but only abnormal results are displayed)  Labs Reviewed  CBC - Abnormal; Notable for the following components:      Result Value   WBC 0.8 (*)    RBC  3.69 (*)    Hemoglobin 8.1 (*)    HCT 27.5 (*)    MCV 74.7 (*)    MCH 22.0 (*)    MCHC 29.5 (*)    RDW 21.2 (*)    Platelets 83 (*)    All other components within normal limits  COMPREHENSIVE METABOLIC PANEL - Abnormal; Notable for the following components:   Sodium 132 (*)    Chloride 100 (*)    Glucose, Bld 201 (*)    Creatinine, Ser 1.35 (*)    Calcium 8.7 (*)    Albumin 3.1 (*)    AST 107 (*)    Alkaline Phosphatase 361 (*)    Total Bilirubin 2.6 (*)    GFR calc non Af Amer 56 (*)    All other components within normal limits  BRAIN NATRIURETIC PEPTIDE - Abnormal; Notable for  the following components:   B Natriuretic Peptide 456.0 (*)    All other components within normal limits  INFLUENZA PANEL BY PCR (TYPE A & B) - Abnormal; Notable for the following components:   Influenza A By PCR POSITIVE (*)    All other components within normal limits  CULTURE, BLOOD (ROUTINE X 2)  CULTURE, BLOOD (ROUTINE X 2)  URINE CULTURE  TROPONIN I  LACTIC ACID, PLASMA  BLOOD GAS, VENOUS  URINALYSIS, COMPLETE (UACMP) WITH MICROSCOPIC  LACTIC ACID, PLASMA   ____________________________________________  EKG  ED ECG REPORT I, Eula Listen, the attending physician, personally viewed and interpreted this ECG.   Date: 01/25/2017  EKG Time: 808  Rate: 70  Rhythm: normal sinus rhythm  Axis: normal  Intervals:none  ST&T Change: No STEMI  ____________________________________________  RADIOLOGY  Dg Chest 2 View  Result Date: 01/25/2017 CLINICAL DATA:  Cough and congestion with shortness of breath for 2 days. EXAM: CHEST  2 VIEW COMPARISON:  10/15/2016 PET CT and 10/14/2015 chest radiograph FINDINGS: The cardiopericardial silhouette is unremarkable. Fullness of both hilar regions again noted. Right upper lobe and left lower lobe nodules are again noted and do not appear significantly changed in size since 10/15/2016 PET CT. Other known nodules are difficult to visualized. There is  no evidence of airspace disease, pleural effusion or pneumothorax. IMPRESSION: 1. No evidence of acute cardiopulmonary disease 2. Pulmonary nodules and bilateral hilar fullness again noted, do not appear significantly changed since 10/15/2016 PET-CT. Electronically Signed   By: Margarette Canada M.D.   On: 01/25/2017 08:46    ____________________________________________   PROCEDURES  Procedure(s) performed: None  Procedures  Critical Care performed: No ____________________________________________   INITIAL IMPRESSION / ASSESSMENT AND PLAN / ED COURSE  Pertinent labs & imaging results that were available during my care of the patient were reviewed by me and considered in my medical decision making (see chart for details).  62 y.o. male on chemo for renal cell carcinoma that is metastatic to the lungs presenting with 2 days of cough and shortness of breath.  Overall, the patient is hemodynamically stable but clinically I am concerned about his severe pulmonary infection such as pneumonia.  Influenza or viral URI is also possible.  Pulmonary edema is possible as well, although the patient has no history of CHF; a BNP has been ordered.  Additionally, worsening metastatic disease in the lung is on the differential.  PE is less likely although the patient is at increased risk due to his cancer.  We will plan to initiate empiric antibiotics, and proceed with an infectious workup.  The patient will require admission to the hospital for further evaluation and treatment.  ----------------------------------------- 9:39 AM on 01/25/2017 -----------------------------------------  At this time, the patient will be admitted to the hospital.  He is positive for influenza A and will be treated with Tamiflu.  He does have a very low white blood cell count 0.8.  He has a chronic anemia that is unchanged as well as chronic hyponatremia that is unchanged.  He is hyperglycemic without DKA, his chest x-ray does not  show any infiltrate.  ____________________________________________  FINAL CLINICAL IMPRESSION(S) / ED DIAGNOSES  Final diagnoses:  Influenza A  Shortness of breath  Hyponatremia         NEW MEDICATIONS STARTED DURING THIS VISIT:  New Prescriptions   No medications on file      Eula Listen, MD 01/25/17 [phone]

## 2017-01-25 NOTE — Progress Notes (Signed)
Family Meeting Note  Advance Directive:no  Today a meeting took place with the Patient.  The following clinical team members were present during this meeting:MD  The following were discussed:Patient's diagnosis: Stage IV renal carcinoma with metastatic disease currently on chemotherapy Influenza A , Patient's progosis: Unable to determine and Goals for treatment: Full Code  Additional follow-up to be provided: Patient would most definitely benefit from discussion about overall prognosis with his oncologist. Oncology evaluation requested Chaplain consultation to start advance directive  Time spent during discussion: 17 minutes  Rodney Pickrell, MD

## 2017-01-25 NOTE — Consult Note (Addendum)
Hematology/Oncology Consult note [facility] [phone] [phone]  Patient Care Team: System, Pcp Not In as PCP - General   Name of the patient: Rodney Stewart  [id number]  January 29, 1955   Date of visit: 01/25/17 REASON FOR COSULTATION:  Metastatic kidney cancer, evaluation of low white count. History of presenting illness-  62 year old patient with history of stage IV renal cell carcinoma with bilateral lung metastasis, currently on immunotherapy who presented to emergency room complaining of shortness of breath, congestion and generalized weakness.  Denies any fever or chills. He received immunotherapy nivolumab on January 23, 2017. He was tested positive for influenza and has been currently admitted for treatment.  Was noticed that his white count has been low.  Oncology was consulted for further evaluation. Patient was seen and examined at bedside.  He is eating lunch at bedside.  Reports feeling the same way since admission.  Denies any chest pain, abdominal pain, nausea vomiting.   Review of systems- Review of Systems  Constitutional: Positive for malaise/fatigue. Negative for chills and fever.  HENT: Negative for hearing loss.   Eyes: Negative for pain.  Respiratory: Positive for cough and shortness of breath.   Cardiovascular: Negative for chest pain.  Gastrointestinal: Negative for nausea and vomiting.  Genitourinary: Negative for dysuria.  Musculoskeletal: Negative for myalgias.  Skin: Negative for rash.  Neurological: Positive for weakness. Negative for dizziness.  Endo/Heme/Allergies: Does not bruise/bleed easily.  Psychiatric/Behavioral: Negative for depression.    No Known Allergies  Patient Active Problem List   Diagnosis Date Noted  . Influenza A 01/25/2017  . Iron deficiency anemia 01/23/2017  . Goals of care, counseling/discussion 11/27/2016  . Metastatic renal cell carcinoma to lung ([place])   . ARF (acute renal  failure) ([place]) 09/30/2015  . Type 2 diabetes mellitus with moderate nonproliferative diabetic retinopathy and without macular edema ([place]) 09/17/2014  . Cataract 08/20/2014  . Hypertension 07/02/2014  . CKD (chronic kidney disease) stage 3, GFR 30-59 ml/min (HCC) 11/30/2013  . Single kidney 02/04/2013     Past Medical History:  Diagnosis Date  . Anemia   . BPH (benign prostatic hyperplasia)   . CKD (chronic kidney disease) stage 3, GFR 30-59 ml/min (HCC)   . Clear cell renal cell carcinoma (HCC)   . Diabetes mellitus without complication ([place])   . Dyspnea    with exertion  . GERD (gastroesophageal reflux disease)   . History of gout   . History of nephrectomy    Left  . Hypertension   . Neuropathy   . Renal cell carcinoma of left kidney (HCC)    mets to lungs  . Umbilical hernia      Past Surgical History:  Procedure Laterality Date  . BACK SURGERY    . ENDOBRONCHIAL ULTRASOUND N/A 10/14/2015   Procedure: ENDOBRONCHIAL ULTRASOUND;  Surgeon: [name], MD;  Location: [facility] ORS;  Service: Pulmonary;  Laterality: N/A;  . EYE SURGERY Bilateral    Cataract Extraction with IOL  . KNEE SURGERY Right   . NEPHRECTOMY      Social History   Socioeconomic History  . Marital status: Divorced    Spouse name: Not on file  . Number of children: Not on file  . Years of education: Not on file  . Highest education level: Not on file  Social Needs  . Financial resource strain: Not on file  . Food insecurity - worry: Not on file  . Food insecurity - inability: Not on file  .  Transportation needs - medical: Not on file  . Transportation needs - non-medical: Not on file  Occupational History  . Not on file  Tobacco Use  . Smoking status: Former Smoker    Packs/day: 1.00    Types: Cigarettes  . Smokeless tobacco: Never Used  Substance and Sexual Activity  . Alcohol use: Yes    Alcohol/week: 25.2 oz    Types: 42 Cans of beer per week    Comment: 4-5 cans of beer a  day  . Drug use: No  . Sexual activity: Not on file  Other Topics Concern  . Not on file  Social History Narrative  . Not on file     Family History  Problem Relation Age of Onset  . Ovarian cancer Mother   . Diabetes Father   . Hypertension Father      Current Facility-Administered Medications:  .  0.9 %  sodium chloride infusion, , Intravenous, Continuous, Mody, Sital, MD, Last Rate: 75 mL/hr at 01/25/17 1124 .  acetaminophen (TYLENOL) tablet 325 mg, 325 mg, Oral, Q6H PRN, Mody, Sital, MD .  ALPRAZolam Duanne Moron) tablet 1 mg, 1 mg, Oral, QHS PRN, Mody, Sital, MD .  amLODipine (NORVASC) tablet 10 mg, 10 mg, Oral, Daily, Mody, Sital, MD, 10 mg at 01/25/17 1203 .  aspirin chewable tablet 81 mg, 81 mg, Oral, Daily, Mody, Sital, MD, 81 mg at 01/25/17 1203 .  benzonatate (TESSALON) capsule 100 mg, 100 mg, Oral, TID, Mody, Sital, MD, 100 mg at 01/25/17 1202 .  bisacodyl (DULCOLAX) suppository 10 mg, 10 mg, Rectal, Daily PRN, Bettey Costa, MD .  Derrill Memo ON 01/26/2017] colchicine tablet 0.6 mg, 0.6 mg, Oral, QODAY, Mody, Sital, MD .  gabapentin (NEURONTIN) capsule 200 mg, 200 mg, Oral, TID, Mody, Sital, MD, 200 mg at 01/25/17 1202 .  guaiFENesin-dextromethorphan (ROBITUSSIN DM) 100-10 MG/5ML syrup 5 mL, 5 mL, Oral, Q4H PRN, Mody, Sital, MD .  insulin aspart (novoLOG) injection 0-9 Units, 0-9 Units, Subcutaneous, TID WC, Mody, Sital, MD, 5 Units at 01/25/17 1204 .  [START ON 01/26/2017] insulin aspart (novoLOG) injection 15 Units, 15 Units, Subcutaneous, Q breakfast, Mody, Sital, MD .  insulin detemir (LEVEMIR) injection 45 Units, 45 Units, Subcutaneous, Q2200, Mody, Sital, MD .  losartan (COZAAR) tablet 25 mg, 25 mg, Oral, Daily, Mody, Sital, MD, 25 mg at 01/25/17 1203 .  nebivolol (BYSTOLIC) tablet 20 mg, 20 mg, Oral, Daily, Mody, Sital, MD, 20 mg at 01/25/17 1203 .  ondansetron (ZOFRAN) tablet 4 mg, 4 mg, Oral, Q6H PRN **OR** ondansetron (ZOFRAN) injection 4 mg, 4 mg, Intravenous, Q6H PRN, Mody,  Sital, MD .  oseltamivir (TAMIFLU) capsule 75 mg, 75 mg, Oral, BID, Mody, Sital, MD, 75 mg at 01/25/17 1204 .  oxyCODONE (Oxy IR/ROXICODONE) immediate release tablet 5 mg, 5 mg, Oral, Q4H PRN, Mody, Sital, MD .  polyethylene glycol (MIRALAX / GLYCOLAX) packet 17 g, 17 g, Oral, Daily PRN, Mody, Sital, MD .  pravastatin (PRAVACHOL) tablet 20 mg, 20 mg, Oral, q1800, Mody, Sital, MD .  tamsulosin (FLOMAX) capsule 0.4 mg, 0.4 mg, Oral, QODAY, Mody, Sital, MD, 0.4 mg at 01/25/17 1203   Physical exam:  Vitals:   01/25/17 0930 01/25/17 1000 01/25/17 1030 01/25/17 1056  BP: 116/71 133/71 135/75 135/72  Pulse: 89 85 83 81  Resp: 10 16 15    Temp:    98.3 F (36.8 C)  TempSrc:    Oral  SpO2: 100% 100% 100% 99%  Weight:      Height:  GENERAL:Alert, no distress and comfortable.  EYES: no pallor or icterus OROPHARYNX: no thrush or ulceration; good dentition  NECK: supple, no masses felt LYMPH:  no palpable lymphadenopathy LUNGS: clear to auscultation and  No wheeze or crackles HEART/CVS: regular rate & rhythm and no murmurs; No lower extremity edema ABDOMEN: abdomen soft, non-tender and normal bowel sounds Umbilical hernia  Musculoskeletal:no cyanosis of digits and no clubbing  PSYCH: alert & oriented x 3  NEURO: no focal motor/sensory deficits SKIN:  no rashes or significant lesions     CMP Latest Ref Rng & Units 01/25/2017  Glucose 65 - 99 mg/dL 201(H)  BUN 6 - 20 mg/dL 12  Creatinine 0.61 - 1.24 mg/dL 1.35(H)  Sodium 135 - 145 mmol/L 132(L)  Potassium 3.5 - 5.1 mmol/L 4.6  Chloride 101 - 111 mmol/L 100(L)  CO2 22 - 32 mmol/L 23  Calcium 8.9 - 10.3 mg/dL 8.7(L)  Total Protein 6.5 - 8.1 g/dL 6.5  Total Bilirubin 0.3 - 1.2 mg/dL 2.6(H)  Alkaline Phos 38 - 126 U/L 361(H)  AST 15 - 41 U/L 107(H)  ALT 17 - 63 U/L 27   CBC Latest Ref Rng & Units 01/25/2017  WBC 3.8 - 10.6 K/uL 0.8(LL)  Hemoglobin 13.0 - 18.0 g/dL 8.1(L)  Hematocrit 40.0 - 52.0 % 27.5(L)  Platelets 150 - 440  K/uL 83(L)     Dg Chest 2 View  Result Date: 01/25/2017 CLINICAL DATA:  Cough and congestion with shortness of breath for 2 days. EXAM: CHEST  2 VIEW COMPARISON:  10/15/2016 PET CT and 10/14/2015 chest radiograph FINDINGS: The cardiopericardial silhouette is unremarkable. Fullness of both hilar regions again noted. Right upper lobe and left lower lobe nodules are again noted and do not appear significantly changed in size since 10/15/2016 PET CT. Other known nodules are difficult to visualized. There is no evidence of airspace disease, pleural effusion or pneumothorax. IMPRESSION: 1. No evidence of acute cardiopulmonary disease 2. Pulmonary nodules and bilateral hilar fullness again noted, do not appear significantly changed since 10/15/2016 PET-CT. Electronically Signed   By: Margarette Canada M.D.   On: 01/25/2017 08:46    Assessment and plan- Patient is a 62 y.o. male with history of stage IV renal cell carcinoma currently on immunotherapy presented with generalized weakness, fatigue, shortness of breath, congestion.  #Stage IV renal cell carcinoma, on immunotherapy with Nivolumab, last received on 01/23/2017.  Most recent PET scan on October 15, 2016 revealed stable disease.  Chest x-ray during this admission showed no significant changes.  He can follow-up with Dr. Grayland Ormond outpatient for future treatment.  #Pancytopenia: Reviewed patient's previous lab work, he has had chronic anemia, leukopenia with predominantly neutropenia and lymphopenia, thrombocytopenia.  There was a plan of future bone marrow biopsy for further evaluation per Dr. Gary Fleet note. During this admission, .neutropenia/lymphopenia got worse, likely secondary to acute infection with influenza.  Agree with continue Tamiflu if treating virus infection. Since he is neutropenic, I will add prophylactic antibiotics Cipro 500 mg daily and to his [place] returns to baseline.  I hold G-CSF for now as underlying bone marrow etiology is  unknown. Currently he appears stable without any signs of fever/hypotension.  Should he spiked a fever or becomes unstable, G-CSF can be considered. #Acute on chronic thrombocytopenia: Chronic thrombus cytopenia likely secondary to splenomegaly and underlying liver cirrhosis.  An acute infection with influenza further worsen this from cytopenia.  Currently no active bleeding.  Transfuse if platelet less than 10,000 or active bleeding.  Thank  you for this kind referral and the opportunity to participate in the care of this patient  Earlie Server, MD, PhD Hematology [place] at [facility] Pager- [phone] 01/25/2017

## 2017-01-25 NOTE — Progress Notes (Signed)
Nutrition Brief Note  Patient identified on the Malnutrition Screening Tool (MST) Report  Wt Readings from Last 15 Encounters:  01/25/17 194 lb (88 kg)  01/23/17 194 lb 9.6 oz (88.3 kg)  01/09/17 185 lb 9.6 oz (84.2 kg)  12/26/16 183 lb 4.8 oz (83.1 kg)  11/28/16 176 lb 3.2 oz (79.9 kg)  10/24/16 170 lb 12.8 oz (77.5 kg)  10/10/16 173 lb 14.4 oz (78.9 kg)  09/24/16 180 lb 8.9 oz (81.9 kg)  09/10/16 176 lb 12.8 oz (80.2 kg)  08/13/16 178 lb 9.6 oz (81 kg)  07/16/16 178 lb 3.2 oz (80.8 kg)  06/18/16 179 lb 3 oz (81.3 kg)  06/04/16 182 lb (82.6 kg)  04/30/16 182 lb 3.2 oz (82.6 kg)  04/16/16 193 lb 8 oz (87.8 kg)   Spoke with patient at bedside. He states UBW of 185-190 pounds. PO intake at home was normally fruit for breakfast, soup or sandwich for lunch, fast food for dinner. He has no current complaints, no needs.  Body mass index is 27.06 kg/m. Patient meets criteria for overweight based on current BMI.   Current diet order is regular, no meal completion documented. Labs and medications reviewed.   No nutrition interventions warranted at this time. If nutrition issues arise, please consult RD.   Satira Anis. Rodney Stewart, RD LDN Inpatient Clinical Dietitian Pager [phone]

## 2017-01-25 NOTE — ED Triage Notes (Signed)
Pt arrived via POV from home with reports of shortness of breath, states sxs started Wednesday but congestion worse since yesterday.  Pt c/o shortness of breath at rest and with ambulation. Pt states he is also being treated for kidney cancer. Pt last chemo was Wednesday and also had an iron infusion.

## 2017-01-25 NOTE — Progress Notes (Signed)
Inpatient Diabetes Program Recommendations  AACE/ADA: New Consensus Statement on Inpatient Glycemic Control (2015)  Target Ranges:  Prepandial:   less than 140 mg/dL      Peak postprandial:   less than 180 mg/dL (1-2 hours)      Critically ill patients:  140 - 180 mg/dL   Lab Results  Component Value Date   GLUCAP 293 (H) 01/25/2017   HGBA1C 8.2 (H) 10/01/2015    Review of Glycemic Control  Results for [place], Kyser B (MRN [id number]) as of 01/25/2017 14:58  Ref. Range 01/25/2017 11:41  Glucose-Capillary Latest Ref Range: 65 - 99 mg/dL 293 (H)    Diabetes history: Type 2 Outpatient Diabetes medications: Novolog 15 units qam, Levemir 45 units qhs - confirmed with patient Current orders for Inpatient glycemic control: Levemir 45 units qhs, Novolog 15 units qam, Novolog 0-9 units tid  Inpatient Diabetes Program Recommendations: Consider decreasing Levemir to 35 units qhs, consider decrease am Novolog to 10 units qam and increase Novolog correction to 0-15 units tid  Consider adding Novolog 0-5 units qhs  Text page to Dr. Benjie Karvonen at 3:06pm  Gentry Fitz, RN, [place], [place], CDE Diabetes Coordinator Inpatient Diabetes Program  [phone] (Team Pager) [phone] ([place]) 01/25/2017 3:04 PM

## 2017-01-25 NOTE — Progress Notes (Signed)
°   01/25/17 1230  Clinical Encounter Type  Visited With Patient  Visit Type Initial  Referral From Nurse  Consult/Referral To Chaplain  Spiritual Encounters  Spiritual Needs Literature;Brochure   CH received a PT consult to create or update an Advance Directive. Upon arriving at PT's RM it was discovered that PT did not need a AD. PT stated that he had one. [place] appolized for the misunderstanding and left PT's RM.

## 2017-01-25 NOTE — H&P (Signed)
[facility] [place] NAME: Rodney Stewart    MR#:  [id number]  DATE OF BIRTH:  10/23/1955  DATE OF ADMISSION:  01/25/2017  PRIMARY CARE PHYSICIAN: dr Regino Bellow   REQUESTING/REFERRING PHYSICIAN: dr Mariea Clonts  CHIEF COMPLAINT:    SOB and weakness HISTORY OF PRESENT ILLNESS:  Rodney Stewart  is a 62 y.o. male with a known history of stage IV renal cell carcinoma with bilateral lung metastasis, chronic kidney disease stage III and diabetes who presents today to the ER due to shortness of breath and generalized weakness. Patient reports since this morning he has had increasing shortness of breath, congestion and generalized weakness. He denies fever or chills. He saw his oncologist on January 23 and at that time he had chemotherapy infusion.  Testing in the emergency room reveals that the patient has influenza a. His white blood cell count is lower than baseline.  PAST MEDICAL HISTORY:   Past Medical History:  Diagnosis Date  . Anemia   . BPH (benign prostatic hyperplasia)   . CKD (chronic kidney disease) stage 3, GFR 30-59 ml/min (HCC)   . Clear cell renal cell carcinoma (HCC)   . Diabetes mellitus without complication ([place])   . Dyspnea    with exertion  . GERD (gastroesophageal reflux disease)   . History of gout   . History of nephrectomy    Left  . Hypertension   . Neuropathy   . Renal cell carcinoma of left kidney (HCC)    mets to lungs  . Umbilical hernia     PAST SURGICAL HISTORY:   Past Surgical History:  Procedure Laterality Date  . BACK SURGERY    . ENDOBRONCHIAL ULTRASOUND N/A 10/14/2015   Procedure: ENDOBRONCHIAL ULTRASOUND;  Surgeon: [name], MD;  Location: [facility] ORS;  Service: Pulmonary;  Laterality: N/A;  . EYE SURGERY Bilateral    Cataract Extraction with IOL  . KNEE SURGERY Right   . NEPHRECTOMY      SOCIAL HISTORY:   Social History   Tobacco Use  . Smoking status: Former Smoker    Packs/day:  1.00    Types: Cigarettes  . Smokeless tobacco: Never Used  Substance Use Topics  . Alcohol use: Yes    Alcohol/week: 25.2 oz    Types: 42 Cans of beer per week    Comment: 4-5 cans of beer a day    FAMILY HISTORY:   Family History  Problem Relation Age of Onset  . Ovarian cancer Mother   . Diabetes Father   . Hypertension Father     DRUG ALLERGIES:  No Known Allergies  REVIEW OF SYSTEMS:   Review of Systems  Constitutional: Positive for malaise/fatigue. Negative for chills and fever.  HENT: Negative.  Negative for ear discharge, ear pain, hearing loss, nosebleeds and sore throat.   Eyes: Negative.  Negative for blurred vision and pain.  Respiratory: Positive for cough and shortness of breath. Negative for hemoptysis and wheezing.   Cardiovascular: Negative.  Negative for chest pain, palpitations and leg swelling.  Gastrointestinal: Negative.  Negative for abdominal pain, blood in stool, diarrhea, nausea and vomiting.  Genitourinary: Negative.  Negative for dysuria.  Musculoskeletal: Negative.  Negative for back pain.  Skin: Negative.   Neurological: Positive for weakness. Negative for dizziness, tremors, speech change, focal weakness, seizures and headaches.  Endo/Heme/Allergies: Negative.  Does not bruise/bleed easily.  Psychiatric/Behavioral: Negative.  Negative for depression, hallucinations and suicidal ideas.    MEDICATIONS AT HOME:  Prior to Admission medications   Medication Sig Start Date End Date Taking? Authorizing Provider  acetaminophen (TYLENOL) 325 MG tablet Take 325 mg by mouth every 6 (six) hours as needed.   Yes [provider]  ALPRAZolam Duanne Moron) 1 MG tablet Take 1 mg by mouth as needed for sleep. 06/10/16  Yes [provider]  amLODipine (NORVASC) 10 MG tablet Take 10 mg by mouth daily.  01/06/16  Yes [provider]  aspirin 81 MG chewable tablet Chew 81 mg by mouth daily.   Yes [provider]  gabapentin  (NEURONTIN) 100 MG capsule Take 200 mg by mouth 3 (three) times daily.    Yes [provider]  losartan (COZAAR) 25 MG tablet Take 25 mg by mouth daily.    Yes [provider]  Pseudoeph-Doxylamine-DM-APAP (NYQUIL PO) Take by mouth as needed.   Yes [provider]  colchicine 0.6 MG tablet Take 0.6 mg by mouth every other day.  06/26/15   [provider]  insulin aspart (NOVOLOG) 100 UNIT/ML FlexPen Inject 15 Units into the skin daily.  08/28/16 08/28/17  [provider]  Insulin Detemir (LEVEMIR FLEXTOUCH) 100 UNIT/ML Pen Inject 45 Units into the skin daily at 10 pm.  08/28/16   [provider]  lovastatin (MEVACOR) 20 MG tablet Take 20 mg by mouth daily.  07/20/15   [provider]  Nebivolol HCl 20 MG TABS Take 20 mg by mouth daily.    [provider]  ONE TOUCH ULTRA TEST test strip  10/09/16   [provider]  tamsulosin (FLOMAX) 0.4 MG CAPS capsule Take 0.4 mg by mouth every other day.  08/02/15   [provider]      VITAL SIGNS:  Blood pressure 117/75, pulse 88, temperature 97.7 F (36.5 C), temperature source Oral, resp. rate 10, height 5\' 11"  (1.803 m), weight 88 kg (194 lb), SpO2 100 %.  PHYSICAL EXAMINATION:   Physical Exam  Constitutional: He is oriented to person, place, and time and well-developed, well-nourished, and in no distress. No distress.  HENT:  Head: Normocephalic.  Eyes: No scleral icterus.  Neck: Normal range of motion. Neck supple. No JVD present. No tracheal deviation present.  Cardiovascular: Normal rate, regular rhythm and normal heart sounds. Exam reveals no gallop and no friction rub.  No murmur heard. Pulmonary/Chest: Effort normal and breath sounds normal. No respiratory distress. He has no wheezes. He has no rales. He exhibits no tenderness.  Abdominal: Soft. Bowel sounds are normal. He exhibits no distension and no mass. There is no tenderness. There is no rebound and no  guarding.  Umbilical hernia  Musculoskeletal: Normal range of motion. He exhibits no edema.  Neurological: He is alert and oriented to person, place, and time.  Skin: Skin is warm. No rash noted. No erythema.  Psychiatric: Affect and judgment normal.      LABORATORY PANEL:   CBC Recent Labs  Lab 01/25/17 0830  WBC 0.8*  HGB 8.1*  HCT 27.5*  PLT 83*   ------------------------------------------------------------------------------------------------------------------  Chemistries  Recent Labs  Lab 01/25/17 0830  NA 132*  K 4.6  CL 100*  CO2 23  GLUCOSE 201*  BUN 12  CREATININE 1.35*  CALCIUM 8.7*  AST 107*  ALT 27  ALKPHOS 361*  BILITOT 2.6*   ------------------------------------------------------------------------------------------------------------------  Cardiac Enzymes Recent Labs  Lab 01/25/17 0830  TROPONINI <0.03   ------------------------------------------------------------------------------------------------------------------  RADIOLOGY:  Dg Chest 2 View  Result Date: 01/25/2017 CLINICAL DATA:  Cough  and congestion with shortness of breath for 2 days. EXAM: CHEST  2 VIEW COMPARISON:  10/15/2016 PET CT and 10/14/2015 chest radiograph FINDINGS: The cardiopericardial silhouette is unremarkable. Fullness of both hilar regions again noted. Right upper lobe and left lower lobe nodules are again noted and do not appear significantly changed in size since 10/15/2016 PET CT. Other known nodules are difficult to visualized. There is no evidence of airspace disease, pleural effusion or pneumothorax. IMPRESSION: 1. No evidence of acute cardiopulmonary disease 2. Pulmonary nodules and bilateral hilar fullness again noted, do not appear significantly changed since 10/15/2016 PET-CT. Electronically Signed   By: Margarette Canada M.D.   On: 01/25/2017 08:46    EKG:   Normal sinus rhythm no ST elevation or depression  IMPRESSION AND PLAN:   62 year old male with stage IV  renal carcinoma with metastatic disease to the lungs who presents with generalized weakness and shortness of breath and found to have positive influenza a.  1. Influenza a likely causing shortness of breath and generalized weakness: Patient will be treated with Tamiflu  2. Leukopenia: Patient has baseline pancytopenia however WBC lower than baseline due to the fact patient had recent chemotherapy and from influenza A. CBC for a.m.  3. Stage IV renal carcinoma with metastatic disease: Oncology evaluation requested  4. Diabetes: Continue outpatient regimen with ADA diet and sliding scale  5. Diabetic neuropathy: Continue gabapentin  6. Essential hypertension: Continue losartan and Norvasc    All the records are reviewed and case discussed with ED provider. Management plans discussed with the patient and he is in agreement  CODE STATUS: Full  TOTAL TIME TAKING CARE OF THIS PATIENT: 42 minutes.    Vaida Kerchner M.D on 01/25/2017 at 10:05 AM  Between 7am to 6pm - Pager - [phone]  After 6pm go to [email or web address] - password [email or web address] [facility]  Office  [phone]  CC: Primary care physician; [facility]

## 2017-01-26 LAB — CBC
HCT: 25 % — ABNORMAL LOW (ref 40.0–52.0)
HEMOGLOBIN: 7.4 g/dL — AB (ref 13.0–18.0)
MCH: 22.1 pg — ABNORMAL LOW (ref 26.0–34.0)
MCHC: 29.6 g/dL — ABNORMAL LOW (ref 32.0–36.0)
MCV: 74.6 fL — AB (ref 80.0–100.0)
Platelets: 84 10*3/uL — ABNORMAL LOW (ref 150–440)
RBC: 3.34 MIL/uL — AB (ref 4.40–5.90)
RDW: 20.4 % — ABNORMAL HIGH (ref 11.5–14.5)
WBC: 0.9 10*3/uL — AB (ref 3.8–10.6)

## 2017-01-26 LAB — DIFFERENTIAL
Basophils Absolute: 0 10*3/uL (ref 0–0.1)
Basophils Relative: 2 %
Eosinophils Absolute: 0.1 10*3/uL (ref 0–0.7)
Eosinophils Relative: 8 %
LYMPHS PCT: 32 %
Lymphs Abs: 0.3 10*3/uL — ABNORMAL LOW (ref 1.0–3.6)
MONO ABS: 0.2 10*3/uL (ref 0.2–1.0)
Monocytes Relative: 18 %
NEUTROS ABS: 0.4 10*3/uL — AB (ref 1.4–6.5)
Neutrophils Relative %: 40 %

## 2017-01-26 LAB — BASIC METABOLIC PANEL
ANION GAP: 5 (ref 5–15)
BUN: 12 mg/dL (ref 6–20)
CO2: 25 mmol/L (ref 22–32)
Calcium: 7.9 mg/dL — ABNORMAL LOW (ref 8.9–10.3)
Chloride: 102 mmol/L (ref 101–111)
Creatinine, Ser: 1.23 mg/dL (ref 0.61–1.24)
GFR calc Af Amer: >60 mL/min (ref >60)
GLUCOSE: 152 mg/dL — AB (ref 65–99)
POTASSIUM: 4.5 mmol/L (ref 3.5–5.1)
Sodium: 132 mmol/L — ABNORMAL LOW (ref 135–145)

## 2017-01-26 LAB — GLUCOSE, CAPILLARY
GLUCOSE-CAPILLARY: 120 mg/dL — AB (ref 65–99)
Glucose-Capillary: 128 mg/dL — ABNORMAL HIGH (ref 65–99)
Glucose-Capillary: 97 mg/dL (ref 65–99)
Glucose-Capillary: 157 mg/dL — ABNORMAL HIGH (ref 65–99)

## 2017-01-26 LAB — HIV ANTIBODY (ROUTINE TESTING W REFLEX): HIV Screen 4th Generation wRfx: NONREACTIVE

## 2017-01-26 MED ORDER — OXYCODONE HCL 5 MG PO TABS
10.0000 mg | ORAL_TABLET | Freq: Four times a day (QID) | ORAL | Status: DC | PRN
  Administered 2017-01-26: 13:00:00 10 mg via ORAL
  Filled 2017-01-26: qty 2

## 2017-01-26 MED ORDER — GUAIFENESIN-DM 100-10 MG/5ML PO SYRP
10.0000 mL | ORAL_SOLUTION | ORAL | Status: DC | PRN
  Administered 2017-01-26 – 2017-01-27 (×3): 10 mL via ORAL
  Filled 2017-01-26 (×4): qty 10

## 2017-01-26 NOTE — Progress Notes (Signed)
[facility] at [place] NAME: Rodney Stewart    MR#:  [id number]  DATE OF BIRTH:  24-Apr-1955  SUBJECTIVE:  CHIEF COMPLAINT: Patient is feeling weak.  Reporting cough and back pain  REVIEW OF SYSTEMS:  CONSTITUTIONAL: No fever, fatigue or weakness.  EYES: No blurred or double vision.  EARS, NOSE, AND THROAT: No tinnitus or ear pain.  RESPIRATORY: Reporting cough, denies shortness of breath while resting, denies wheezing or hemoptysis.  CARDIOVASCULAR: No chest pain, orthopnea, edema.  GASTROINTESTINAL: No nausea, vomiting, diarrhea or abdominal pain.  GENITOURINARY: No dysuria, hematuria.  ENDOCRINE: No polyuria, nocturia,  HEMATOLOGY: No anemia, easy bruising or bleeding SKIN: No rash or lesion. MUSCULOSKELETAL: No joint pain or arthritis.   NEUROLOGIC: No tingling, numbness, weakness.  PSYCHIATRY: No anxiety or depression.   DRUG ALLERGIES:  No Known Allergies  VITALS:  Blood pressure 124/70, pulse 70, temperature 99 F (37.2 C), temperature source Oral, resp. rate 20, height 5\' 11"  (1.803 m), weight 88 kg (194 lb), SpO2 98 %.  PHYSICAL EXAMINATION:  GENERAL:  62 y.o.-year-old patient lying in the bed with no acute distress.  EYES: Pupils equal, round, reactive to light and accommodation. No scleral icterus. Extraocular muscles intact.  HEENT: Head atraumatic, normocephalic. Oropharynx and nasopharynx clear.  NECK:  Supple, no jugular venous distention. No thyroid enlargement, no tenderness.  LUNGS: mod breath sounds bilaterally, no wheezing, rales,rhonchi or crepitation. No use of accessory muscles of respiration.  CARDIOVASCULAR: S1, S2 normal. No murmurs, rubs, or gallops.  ABDOMEN: Soft, nontender, nondistended. Bowel sounds present.  EXTREMITIES: No pedal edema, cyanosis, or clubbing.  NEUROLOGIC: Cranial nerves II through XII are intact. Sensation intact. Gait not checked.  PSYCHIATRIC: The patient is alert and oriented x 3.   SKIN: No obvious rash, lesion, or ulcer.    LABORATORY PANEL:   CBC Recent Labs  Lab 01/26/17 0503  WBC 0.9*  HGB 7.4*  HCT 25.0*  PLT 84*   ------------------------------------------------------------------------------------------------------------------  Chemistries  Recent Labs  Lab 01/25/17 0830 01/26/17 0503  NA 132* 132*  K 4.6 4.5  CL 100* 102  CO2 23 25  GLUCOSE 201* 152*  BUN 12 12  CREATININE 1.35* 1.23  CALCIUM 8.7* 7.9*  AST 107*  --   ALT 27  --   ALKPHOS 361*  --   BILITOT 2.6*  --    ------------------------------------------------------------------------------------------------------------------  Cardiac Enzymes Recent Labs  Lab 01/25/17 0830  TROPONINI <0.03   ------------------------------------------------------------------------------------------------------------------  RADIOLOGY:  Dg Chest 2 View  Result Date: 01/25/2017 CLINICAL DATA:  Cough and congestion with shortness of breath for 2 days. EXAM: CHEST  2 VIEW COMPARISON:  10/15/2016 PET CT and 10/14/2015 chest radiograph FINDINGS: The cardiopericardial silhouette is unremarkable. Fullness of both hilar regions again noted. Right upper lobe and left lower lobe nodules are again noted and do not appear significantly changed in size since 10/15/2016 PET CT. Other known nodules are difficult to visualized. There is no evidence of airspace disease, pleural effusion or pneumothorax. IMPRESSION: 1. No evidence of acute cardiopulmonary disease 2. Pulmonary nodules and bilateral hilar fullness again noted, do not appear significantly changed since 10/15/2016 PET-CT. Electronically Signed   By: Margarette Canada M.D.   On: 01/25/2017 08:46    EKG:   Orders placed or performed during the hospital encounter of 01/25/17  . ED EKG  . ED EKG    ASSESSMENT AND PLAN:   62 year old male with stage IV renal carcinoma with metastatic  disease to the lungs who presents with generalized weakness and shortness  of breath and found to have positive influenza a.  1. Influenza a likely causing shortness of breath and generalized weakness: Patient will be treated with Tamiflu  2.  Pancytopenia: Patient has baseline pancytopenia however WBC lower than baseline due to the fact patient had recent chemotherapy and from influenza A. CBC reveals WBC 0.9 and platelets of 84,000   no bleeding or bruising.  Will transfuse platelets if platelet count is less than 10,000 Oncology is following  3. Stage IV renal carcinoma with metastatic disease: Oncology following Recent PET scan in October 15, 2016 revealed stable disease  chest x-ray with no significant changes  Patient to follow-up with primary oncologist Dr. Grayland Ormond after discharge   4. Diabetes: Continue outpatient regimen with ADA diet and sliding scale  5. Diabetic neuropathy: Continue gabapentin  6. Essential hypertension: Continue losartan and Norvasc       All the records are reviewed and case discussed with Care Management/Social Workerr. Management plans discussed with the patient, family and they are in agreement.  CODE STATUS: fc  TOTAL TIME TAKING CARE OF THIS PATIENT: 36  minutes.   POSSIBLE D/C IN 2 DAYS, DEPENDING ON CLINICAL CONDITION.  Note: This dictation was prepared with Dragon dictation along with smaller phrase technology. Any transcriptional errors that result from this process are unintentional.   Nicholes Mango M.D on 01/26/2017 at 7:30 PM  Between 7am to 6pm - Pager - [phone] After 6pm go to [email or web address] - password [email or web address] [facility]  Office  [phone]  CC: Primary care physician; System, Pcp Not In

## 2017-01-27 LAB — URINE CULTURE: Culture: NO GROWTH

## 2017-01-27 LAB — CBC WITH DIFFERENTIAL/PLATELET
BASOS ABS: 0 10*3/uL (ref 0–0.1)
BASOS PCT: 1 %
EOS PCT: 15 %
Eosinophils Absolute: 0.2 10*3/uL (ref 0–0.7)
HEMATOCRIT: 27.3 % — AB (ref 40.0–52.0)
Hemoglobin: 8.1 g/dL — ABNORMAL LOW (ref 13.0–18.0)
Lymphocytes Relative: 34 %
Lymphs Abs: 0.5 10*3/uL — ABNORMAL LOW (ref 1.0–3.6)
MCH: 22.4 pg — ABNORMAL LOW (ref 26.0–34.0)
MCHC: 29.5 g/dL — AB (ref 32.0–36.0)
MCV: 76.1 fL — AB (ref 80.0–100.0)
MONO ABS: 0.2 10*3/uL (ref 0.2–1.0)
MONOS PCT: 13 %
Neutro Abs: 0.5 10*3/uL — ABNORMAL LOW (ref 1.4–6.5)
Neutrophils Relative %: 37 %
PLATELETS: 91 10*3/uL — AB (ref 150–440)
RBC: 3.59 MIL/uL — ABNORMAL LOW (ref 4.40–5.90)
RDW: 21.2 % — AB (ref 11.5–14.5)
WBC: 1.4 10*3/uL — CL (ref 3.8–10.6)

## 2017-01-27 LAB — GLUCOSE, CAPILLARY
GLUCOSE-CAPILLARY: 85 mg/dL (ref 65–99)
GLUCOSE-CAPILLARY: 144 mg/dL — AB (ref 65–99)
Glucose-Capillary: 56 mg/dL — ABNORMAL LOW (ref 65–99)

## 2017-01-27 MED ORDER — OXYCODONE HCL 5 MG PO TABS: 5.0000 mg | ORAL_TABLET | ORAL | 0 refills | Status: DC | PRN

## 2017-01-27 MED ORDER — INSULIN DETEMIR 100 UNIT/ML FLEXPEN: 35.0000 [IU] | PEN_INJECTOR | Freq: Every day | SUBCUTANEOUS | 11 refills | Status: DC

## 2017-01-27 MED ORDER — ALBUTEROL SULFATE HFA 108 (90 BASE) MCG/ACT IN AERS: 2.0000 | INHALATION_SPRAY | Freq: Four times a day (QID) | RESPIRATORY_TRACT | 0 refills | Status: DC | PRN

## 2017-01-27 MED ORDER — CIPROFLOXACIN HCL 500 MG PO TABS: 500.0000 mg | ORAL_TABLET | Freq: Every day | ORAL | 0 refills | Status: DC

## 2017-01-27 MED ORDER — OSELTAMIVIR PHOSPHATE 75 MG PO CAPS: 75.0000 mg | ORAL_CAPSULE | Freq: Two times a day (BID) | ORAL | 0 refills | Status: AC

## 2017-01-27 MED ORDER — POLYETHYLENE GLYCOL 3350 17 G PO PACK: 17.0000 g | PACK | Freq: Every day | ORAL | 0 refills | Status: DC | PRN

## 2017-01-27 MED ORDER — GUAIFENESIN-DM 100-10 MG/5ML PO SYRP: 10.0000 mL | ORAL_SOLUTION | ORAL | 0 refills | Status: DC | PRN

## 2017-01-27 NOTE — Discharge Summary (Signed)
[facility] NAME: Rodney Stewart    MR#:  [id number]  DATE OF BIRTH:  04-07-1955  DATE OF ADMISSION:  01/25/2017 ADMITTING PHYSICIAN: Bettey Costa, MD  DATE OF DISCHARGE:  01/27/17  PRIMARY CARE PHYSICIAN: System, Pcp Not In    ADMISSION DIAGNOSIS:  Shortness of breath [R06.02] Hyponatremia [E87.1] Influenza A [J10.1]  DISCHARGE DIAGNOSIS:  Active Problems:   Influenza A   Neutropenia associated with infection (HCC)   Malignant neoplasm of kidney (HCC)   Thrombocytopenia (HCC) Urine culture pending  SECONDARY DIAGNOSIS:   Past Medical History:  Diagnosis Date  . Anemia   . BPH (benign prostatic hyperplasia)   . CKD (chronic kidney disease) stage 3, GFR 30-59 ml/min (HCC)   . Clear cell renal cell carcinoma (HCC)   . Diabetes mellitus without complication ([place])   . Dyspnea    with exertion  . GERD (gastroesophageal reflux disease)   . History of gout   . History of nephrectomy    Left  . Hypertension   . Neuropathy   . Renal cell carcinoma of left kidney (HCC)    mets to lungs  . Umbilical hernia     HOSPITAL COURSE:  HPI  Rodney Stewart  is a 62 y.o. male with a known history of stage IV renal cell carcinoma with bilateral lung metastasis, chronic kidney disease stage III and diabetes who presents today to the ER due to shortness of breath and generalized weakness. Patient reports since this morning he has had increasing shortness of breath, congestion and generalized weakness. He denies fever or chills. He saw his oncologist on January 23 and at that time he had chemotherapy infusion.  Testing in the emergency room reveals that the patient has influenza a. His white blood cell count is lower than baseline.  1. Influenza a likely causing shortness of breathand generalized weakness: Patient will be treated with Tamiflu  2.  Pancytopenia: Patient has baseline pancytopenia however WBC lower than baseline due to  the fact patient had recent chemotherapy and from influenza A. CBC reveals WBC 0.9-1.4 , ANC 0.5  and platelets of 84,000 -91,000  no bleeding or bruising.  Will transfuse platelets if platelet count is less than 10,000 Oncology is following, okay to discharge patient from oncology standpoint Discharge with ciprofloxacin  blood cultures are negative and urine cultures are pending.  PCP to follow-up on the pending urine culture results   3. Stage IV renal carcinoma with metastatic disease: Oncology following Recent PET scan in October 15, 2016 revealed stable disease  chest x-ray with no significant changes  Patient to follow-up with primary oncologist Dr. Grayland Ormond after discharge   4. Diabetes: Continue outpatient regimen with ADA diet and sliding scale  5. Diabetic neuropathy: Continue gabapentin  6. Essential hypertension: Continue losartan and Norvasc     DISCHARGE CONDITIONS:   Fair CONSULTS OBTAINED:  Treatment Team:  Lloyd Huger, MD   PROCEDURES NONE  DRUG ALLERGIES:  No Known Allergies  DISCHARGE MEDICATIONS:   Allergies as of 01/27/2017   No Known Allergies     Medication List    TAKE these medications   acetaminophen 325 MG tablet Commonly known as:  TYLENOL Take 325 mg by mouth every 6 (six) hours as needed.   albuterol 108 (90 Base) MCG/ACT inhaler Commonly known as:  PROVENTIL HFA;VENTOLIN HFA Inhale 2 puffs into the lungs every 6 (six) hours as needed for wheezing or shortness of breath.  ALPRAZolam 1 MG tablet Commonly known as:  XANAX Take 1 mg by mouth as needed for sleep.   amLODipine 10 MG tablet Commonly known as:  NORVASC Take 10 mg by mouth daily.   aspirin 81 MG chewable tablet Chew 81 mg by mouth daily.   ciprofloxacin 500 MG tablet Commonly known as:  CIPRO Take 1 tablet (500 mg total) by mouth daily. Start taking on:  01/28/2017   colchicine 0.6 MG tablet Take 0.6 mg by mouth every other day.   gabapentin 100  MG capsule Commonly known as:  NEURONTIN Take 200 mg by mouth 3 (three) times daily.   guaiFENesin-dextromethorphan 100-10 MG/5ML syrup Commonly known as:  ROBITUSSIN DM Take 10 mLs by mouth every 4 (four) hours as needed for cough.   insulin aspart 100 UNIT/ML FlexPen Commonly known as:  NOVOLOG Inject 15 Units into the skin daily.   Insulin Detemir 100 UNIT/ML Pen Commonly known as:  LEVEMIR FLEXTOUCH Inject 35 Units into the skin daily at 10 pm. What changed:  how much to take   losartan 25 MG tablet Commonly known as:  COZAAR Take 25 mg by mouth daily.   lovastatin 20 MG tablet Commonly known as:  MEVACOR Take 20 mg by mouth daily.   Nebivolol HCl 20 MG Tabs Take 20 mg by mouth daily.   NYQUIL PO Take by mouth as needed.   ONE TOUCH ULTRA TEST test strip Generic drug:  glucose blood   oseltamivir 75 MG capsule Commonly known as:  TAMIFLU Take 1 capsule (75 mg total) by mouth 2 (two) times daily for 6 days.   oxyCODONE 5 MG immediate release tablet Commonly known as:  Oxy IR/ROXICODONE Take 1 tablet (5 mg total) by mouth every 4 (four) hours as needed for moderate pain.   polyethylene glycol packet Commonly known as:  MIRALAX / GLYCOLAX Take 17 g by mouth daily as needed for mild constipation.   tamsulosin 0.4 MG Caps capsule Commonly known as:  FLOMAX Take 0.4 mg by mouth every other day.        DISCHARGE INSTRUCTIONS:   Follow-up with primary care physician in 5-7 days.  PCP to follow-up on the pending urine culture results Follow-up with oncology Dr. Grayland Ormond in 7days   DIET:  Cardiac diet and Diabetic diet  DISCHARGE CONDITION:  Fair  ACTIVITY:  Activity as tolerated  OXYGEN:  Home Oxygen: No.   Oxygen Delivery: room air  DISCHARGE LOCATION:  home   If you experience worsening of your admission symptoms, develop shortness of breath, life threatening emergency, suicidal or homicidal thoughts you must seek medical attention immediately  by calling 911 or calling your MD immediately  if symptoms less severe.  You Must read complete instructions/literature along with all the possible adverse reactions/side effects for all the Medicines you take and that have been prescribed to you. Take any new Medicines after you have completely understood and accpet all the possible adverse reactions/side effects.   Please note  You were cared for by a hospitalist during your hospital stay. If you have any questions about your discharge medications or the care you received while you were in the hospital after you are discharged, you can call the unit and asked to speak with the hospitalist on call if the hospitalist that took care of you is not available. Once you are discharged, your primary care physician will handle any further medical issues. Please note that NO REFILLS for any discharge medications will be authorized  once you are discharged, as it is imperative that you return to your primary care physician (or establish a relationship with a primary care physician if you do not have one) for your aftercare needs so that they can reassess your need for medications and monitor your lab values.     Today  Chief Complaint  Patient presents with  . Shortness of Breath   Patient is feeling much better today.  Cough and congestion are better than yesterday.  Wants to go home.  Okay to discharge patient from oncology standpoint.  Recommending to discharge with the ciprofloxacin as absolute neutrophil count is 0.5  ROS:  CONSTITUTIONAL: Denies fevers, chills. Denies any fatigue, weakness.  EYES: Denies blurry vision, double vision, eye pain. EARS, NOSE, THROAT: Denies tinnitus, ear pain, hearing loss. RESPIRATORY:  improving  congestion and cough, denies wheeze, shortness of breath.  CARDIOVASCULAR: Denies chest pain, palpitations, edema.  GASTROINTESTINAL: Denies nausea, vomiting, diarrhea, abdominal pain. Denies bright red blood per  rectum. GENITOURINARY: Denies dysuria, hematuria. ENDOCRINE: Denies nocturia or thyroid problems. HEMATOLOGIC AND LYMPHATIC: Denies easy bruising or bleeding. SKIN: Denies rash or lesion. MUSCULOSKELETAL: Denies pain in neck, back, shoulder, knees, hips or arthritic symptoms.  NEUROLOGIC: Denies paralysis, paresthesias.  PSYCHIATRIC: Denies anxiety or depressive symptoms.   VITAL SIGNS:  Blood pressure 120/77, pulse 65, temperature 98.7 F (37.1 C), temperature source Oral, resp. rate 18, height 5\' 11"  (1.803 m), weight 88 kg (194 lb), SpO2 100 %.  I/O:  No intake or output data in the 24 hours ending 01/27/17 1235  PHYSICAL EXAMINATION:  GENERAL:  62 y.o.-year-old patient lying in the bed with no acute distress.  EYES: Pupils equal, round, reactive to light and accommodation. No scleral icterus. Extraocular muscles intact.  HEENT: Head atraumatic, normocephalic. Oropharynx and nasopharynx congested.  NECK:  Supple, no jugular venous distention. No thyroid enlargement, no tenderness.  LUNGS: Normal breath sounds bilaterally, no wheezing, rales,rhonchi or crepitation. No use of accessory muscles of respiration.  CARDIOVASCULAR: S1, S2 normal. No murmurs, rubs, or gallops.  ABDOMEN: Soft, non-tender, non-distended. Bowel sounds present. No organomegaly or mass.  EXTREMITIES: No pedal edema, cyanosis, or clubbing.  NEUROLOGIC: Cranial nerves II through XII are intact. Muscle strength generalized weakness in all extremities. Sensation intact. Gait not checked.  PSYCHIATRIC: The patient is alert and oriented x 3.  SKIN: No obvious rash, lesion, or ulcer.   DATA REVIEW:   CBC Recent Labs  Lab 01/27/17 0327  WBC 1.4*  HGB 8.1*  HCT 27.3*  PLT 91*    Chemistries  Recent Labs  Lab 01/25/17 0830 01/26/17 0503  NA 132* 132*  K 4.6 4.5  CL 100* 102  CO2 23 25  GLUCOSE 201* 152*  BUN 12 12  CREATININE 1.35* 1.23  CALCIUM 8.7* 7.9*  AST 107*  --   ALT 27  --   ALKPHOS 361*   --   BILITOT 2.6*  --     Cardiac Enzymes Recent Labs  Lab 01/25/17 0830  TROPONINI <0.03    Microbiology Results  Results for orders placed or performed during the hospital encounter of 01/25/17  Blood culture (routine x 2)     Status: None (Preliminary result)   Collection Time: 01/25/17  8:31 AM  Result Value Ref Range Status   Specimen Description BLOOD BLOOD LEFT WRIST  Final   Special Requests   Final    BOTTLES DRAWN AEROBIC AND ANAEROBIC Blood Culture results may not be optimal due to an excessive  volume of blood received in culture bottles   Culture   Final    NO GROWTH 2 DAYS Performed at [facility], [address] [place], [place] [address]    Report Status PENDING  Incomplete  Urine culture     Status: None   Collection Time: 01/25/17  8:31 AM  Result Value Ref Range Status   Specimen Description   Final    URINE, RANDOM Performed at [facility], [address] [place], [place] [address]    Special Requests   Final    NONE Performed at [facility], [address], [place], [place] [address]    Culture   Final    NO GROWTH Performed at [facility], [place] [address] [place], [place] [address]    Report Status 01/27/2017 FINAL  Final  Blood culture (routine x 2)     Status: None (Preliminary result)   Collection Time: 01/25/17  8:32 AM  Result Value Ref Range Status   Specimen Description BLOOD LEFT ANTECUBITAL  Final   Special Requests   Final    BOTTLES DRAWN AEROBIC AND ANAEROBIC Blood Culture results may not be optimal due to an excessive volume of blood received in culture bottles   Culture   Final    NO GROWTH 2 DAYS Performed at [facility], [address], [place], [place] [address]    Report Status PENDING  Incomplete    RADIOLOGY:  Dg Chest 2 View  Result Date: 01/25/2017 CLINICAL DATA:  Cough and congestion with shortness of breath for 2 days. EXAM: CHEST  2 VIEW COMPARISON:  10/15/2016  PET CT and 10/14/2015 chest radiograph FINDINGS: The cardiopericardial silhouette is unremarkable. Fullness of both hilar regions again noted. Right upper lobe and left lower lobe nodules are again noted and do not appear significantly changed in size since 10/15/2016 PET CT. Other known nodules are difficult to visualized. There is no evidence of airspace disease, pleural effusion or pneumothorax. IMPRESSION: 1. No evidence of acute cardiopulmonary disease 2. Pulmonary nodules and bilateral hilar fullness again noted, do not appear significantly changed since 10/15/2016 PET-CT. Electronically Signed   By: Margarette Canada M.D.   On: 01/25/2017 08:46    EKG:   Orders placed or performed during the hospital encounter of 01/25/17  . ED EKG  . ED EKG      Management plans discussed with the patient, family and they are in agreement.  CODE STATUS:     Code Status Orders  (From admission, onward)        Start     Ordered   01/25/17 1038  Full code  Continuous     01/25/17 1037    Code Status History    Date Active Date Inactive Code Status Order ID Comments User Context   09/30/2015 16:09 10/03/2015 15:30 Full Code [id number]  Demetrios Loll, MD Inpatient      TOTAL TIME TAKING CARE OF THIS PATIENT: 43  minutes.   Note: This dictation was prepared with Dragon dictation along with smaller phrase technology. Any transcriptional errors that result from this process are unintentional.   @[facility] @  on 01/27/2017 at 12:35 PM  Between 7am to 6pm - Pager - [phone]  After 6pm go to [email or web address] - password [email or web address] [facility]  Office  [phone]  CC: Primary care physician; System, Pcp Not In

## 2017-01-27 NOTE — Discharge Instructions (Signed)
Follow-up with primary care physician in 5-7 days.  PCP to follow-up on the pending urine culture results Follow-up with oncology Dr. Grayland Ormond in 7days

## 2017-01-30 LAB — CULTURE, BLOOD (ROUTINE X 2)
CULTURE: NO GROWTH
Culture: NO GROWTH

## 2017-02-06 ENCOUNTER — Inpatient Hospital Stay: Payer: Medicare HMO | Attending: Oncology

## 2017-02-06 ENCOUNTER — Other Ambulatory Visit: Payer: Self-pay | Admitting: *Deleted

## 2017-02-06 ENCOUNTER — Inpatient Hospital Stay: Payer: Medicare HMO

## 2017-02-06 ENCOUNTER — Ambulatory Visit: Payer: Medicare HMO | Admitting: Oncology

## 2017-02-06 VITALS — BP 114/70 | HR 61 | Temp 98.0°F | Resp 18 | Wt 200.2 lb

## 2017-02-06 DIAGNOSIS — N400 Enlarged prostate without lower urinary tract symptoms: Secondary | ICD-10-CM | POA: Diagnosis not present

## 2017-02-06 DIAGNOSIS — C7802 Secondary malignant neoplasm of left lung: Secondary | ICD-10-CM

## 2017-02-06 DIAGNOSIS — Z7982 Long term (current) use of aspirin: Secondary | ICD-10-CM | POA: Diagnosis not present

## 2017-02-06 DIAGNOSIS — D61818 Other pancytopenia: Secondary | ICD-10-CM | POA: Insufficient documentation

## 2017-02-06 DIAGNOSIS — I700 Atherosclerosis of aorta: Secondary | ICD-10-CM | POA: Insufficient documentation

## 2017-02-06 DIAGNOSIS — Z79899 Other long term (current) drug therapy: Secondary | ICD-10-CM | POA: Insufficient documentation

## 2017-02-06 DIAGNOSIS — K429 Umbilical hernia without obstruction or gangrene: Secondary | ICD-10-CM | POA: Diagnosis not present

## 2017-02-06 DIAGNOSIS — C7800 Secondary malignant neoplasm of unspecified lung: Secondary | ICD-10-CM | POA: Insufficient documentation

## 2017-02-06 DIAGNOSIS — K219 Gastro-esophageal reflux disease without esophagitis: Secondary | ICD-10-CM | POA: Diagnosis not present

## 2017-02-06 DIAGNOSIS — D509 Iron deficiency anemia, unspecified: Secondary | ICD-10-CM | POA: Insufficient documentation

## 2017-02-06 DIAGNOSIS — K7460 Unspecified cirrhosis of liver: Secondary | ICD-10-CM | POA: Insufficient documentation

## 2017-02-06 DIAGNOSIS — I129 Hypertensive chronic kidney disease with stage 1 through stage 4 chronic kidney disease, or unspecified chronic kidney disease: Secondary | ICD-10-CM | POA: Diagnosis not present

## 2017-02-06 DIAGNOSIS — C649 Malignant neoplasm of unspecified kidney, except renal pelvis: Secondary | ICD-10-CM

## 2017-02-06 DIAGNOSIS — N183 Chronic kidney disease, stage 3 (moderate): Secondary | ICD-10-CM | POA: Insufficient documentation

## 2017-02-06 DIAGNOSIS — M109 Gout, unspecified: Secondary | ICD-10-CM | POA: Insufficient documentation

## 2017-02-06 DIAGNOSIS — C642 Malignant neoplasm of left kidney, except renal pelvis: Secondary | ICD-10-CM | POA: Insufficient documentation

## 2017-02-06 DIAGNOSIS — Z794 Long term (current) use of insulin: Secondary | ICD-10-CM | POA: Insufficient documentation

## 2017-02-06 DIAGNOSIS — E1122 Type 2 diabetes mellitus with diabetic chronic kidney disease: Secondary | ICD-10-CM | POA: Insufficient documentation

## 2017-02-06 DIAGNOSIS — Z5111 Encounter for antineoplastic chemotherapy: Secondary | ICD-10-CM | POA: Insufficient documentation

## 2017-02-06 DIAGNOSIS — S3992XA Unspecified injury of lower back, initial encounter: Secondary | ICD-10-CM

## 2017-02-06 DIAGNOSIS — Z87891 Personal history of nicotine dependence: Secondary | ICD-10-CM | POA: Diagnosis not present

## 2017-02-06 DIAGNOSIS — Z905 Acquired absence of kidney: Secondary | ICD-10-CM | POA: Diagnosis not present

## 2017-02-06 LAB — COMPREHENSIVE METABOLIC PANEL
ALBUMIN: 2.8 g/dL — AB (ref 3.5–5.0)
ALT: 14 U/L — ABNORMAL LOW (ref 17–63)
AST: 43 U/L — ABNORMAL HIGH (ref 15–41)
Alkaline Phosphatase: 288 U/L — ABNORMAL HIGH (ref 38–126)
Anion gap: 7 (ref 5–15)
BUN: 19 mg/dL (ref 6–20)
CALCIUM: 8.3 mg/dL — AB (ref 8.9–10.3)
CO2: 24 mmol/L (ref 22–32)
Chloride: 104 mmol/L (ref 101–111)
Creatinine, Ser: 1.41 mg/dL — ABNORMAL HIGH (ref 0.61–1.24)
GFR calc non Af Amer: 53 mL/min — ABNORMAL LOW (ref >60)
GLUCOSE: 155 mg/dL — AB (ref 65–99)
POTASSIUM: 4.7 mmol/L (ref 3.5–5.1)
SODIUM: 135 mmol/L (ref 135–145)
Total Bilirubin: 1.7 mg/dL — ABNORMAL HIGH (ref 0.3–1.2)
Total Protein: 6.7 g/dL (ref 6.5–8.1)

## 2017-02-06 LAB — CBC WITH DIFFERENTIAL/PLATELET
BASOS PCT: 1 %
Basophils Absolute: 0 10*3/uL (ref 0–0.1)
EOS ABS: 0.1 10*3/uL (ref 0–0.7)
EOS PCT: 5 %
HCT: 32.2 % — ABNORMAL LOW (ref 40.0–52.0)
Hemoglobin: 9.9 g/dL — ABNORMAL LOW (ref 13.0–18.0)
LYMPHS ABS: 0.4 10*3/uL — AB (ref 1.0–3.6)
Lymphocytes Relative: 18 %
MCH: 24.3 pg — AB (ref 26.0–34.0)
MCHC: 30.9 g/dL — AB (ref 32.0–36.0)
MCV: 78.7 fL — ABNORMAL LOW (ref 80.0–100.0)
MONO ABS: 0.3 10*3/uL (ref 0.2–1.0)
MONOS PCT: 12 %
Neutro Abs: 1.6 10*3/uL (ref 1.4–6.5)
Neutrophils Relative %: 64 %
PLATELETS: 154 10*3/uL (ref 150–440)
RBC: 4.09 MIL/uL — ABNORMAL LOW (ref 4.40–5.90)
RDW: 23.7 % — AB (ref 11.5–14.5)
WBC: 2.5 10*3/uL — ABNORMAL LOW (ref 3.8–10.6)

## 2017-02-06 MED ORDER — SODIUM CHLORIDE 0.9 % IV SOLN
240.0000 mg | Freq: Once | INTRAVENOUS | Status: AC
  Administered 2017-02-06: 240 mg via INTRAVENOUS
  Filled 2017-02-06: qty 24

## 2017-02-06 MED ORDER — SODIUM CHLORIDE 0.9 % IV SOLN
Freq: Once | INTRAVENOUS | Status: AC
  Administered 2017-02-06: 15:00:00 via INTRAVENOUS
  Filled 2017-02-06: qty 1000

## 2017-02-08 ENCOUNTER — Other Ambulatory Visit: Payer: Self-pay

## 2017-02-08 ENCOUNTER — Emergency Department
Admission: EM | Admit: 2017-02-08 | Discharge: 2017-02-08 | Disposition: A | Discharge status: Home / Self Care | Payer: Medicare HMO | Attending: Emergency Medicine | Admitting: Emergency Medicine

## 2017-02-08 DIAGNOSIS — E11319 Type 2 diabetes mellitus with unspecified diabetic retinopathy without macular edema: Secondary | ICD-10-CM | POA: Diagnosis not present

## 2017-02-08 DIAGNOSIS — E1122 Type 2 diabetes mellitus with diabetic chronic kidney disease: Secondary | ICD-10-CM | POA: Insufficient documentation

## 2017-02-08 DIAGNOSIS — R140 Abdominal distension (gaseous): Secondary | ICD-10-CM

## 2017-02-08 DIAGNOSIS — R4182 Altered mental status, unspecified: Secondary | ICD-10-CM

## 2017-02-08 DIAGNOSIS — Z79899 Other long term (current) drug therapy: Secondary | ICD-10-CM | POA: Diagnosis not present

## 2017-02-08 DIAGNOSIS — N183 Chronic kidney disease, stage 3 (moderate): Secondary | ICD-10-CM | POA: Diagnosis not present

## 2017-02-08 DIAGNOSIS — Z87891 Personal history of nicotine dependence: Secondary | ICD-10-CM | POA: Insufficient documentation

## 2017-02-08 DIAGNOSIS — R001 Bradycardia, unspecified: Secondary | ICD-10-CM | POA: Diagnosis not present

## 2017-02-08 DIAGNOSIS — I129 Hypertensive chronic kidney disease with stage 1 through stage 4 chronic kidney disease, or unspecified chronic kidney disease: Secondary | ICD-10-CM | POA: Insufficient documentation

## 2017-02-08 DIAGNOSIS — Z794 Long term (current) use of insulin: Secondary | ICD-10-CM | POA: Diagnosis not present

## 2017-02-08 DIAGNOSIS — Z7982 Long term (current) use of aspirin: Secondary | ICD-10-CM | POA: Insufficient documentation

## 2017-02-08 LAB — BASIC METABOLIC PANEL
ANION GAP: 7 (ref 5–15)
BUN: 16 mg/dL (ref 6–20)
CHLORIDE: 104 mmol/L (ref 101–111)
CO2: 24 mmol/L (ref 22–32)
Calcium: 8.5 mg/dL — ABNORMAL LOW (ref 8.9–10.3)
Creatinine, Ser: 1.47 mg/dL — ABNORMAL HIGH (ref 0.61–1.24)
GFR calc Af Amer: 58 mL/min — ABNORMAL LOW (ref >60)
GFR calc non Af Amer: 50 mL/min — ABNORMAL LOW (ref >60)
GLUCOSE: 67 mg/dL (ref 65–99)
POTASSIUM: 4.1 mmol/L (ref 3.5–5.1)
Sodium: 135 mmol/L (ref 135–145)

## 2017-02-08 LAB — CBC
HCT: 33.6 % — ABNORMAL LOW (ref 40.0–52.0)
HEMOGLOBIN: 10.5 g/dL — AB (ref 13.0–18.0)
MCH: 24.7 pg — AB (ref 26.0–34.0)
MCHC: 31.2 g/dL — AB (ref 32.0–36.0)
MCV: 79.1 fL — AB (ref 80.0–100.0)
Platelets: 154 10*3/uL (ref 150–440)
RBC: 4.25 MIL/uL — ABNORMAL LOW (ref 4.40–5.90)
RDW: 23.5 % — ABNORMAL HIGH (ref 11.5–14.5)
WBC: 2.2 10*3/uL — ABNORMAL LOW (ref 3.8–10.6)

## 2017-02-08 LAB — TROPONIN I: Troponin I: <0.03 ng/mL (ref <0.03)

## 2017-02-08 NOTE — ED Triage Notes (Addendum)
Pt came to ED via pov c/o feeling weak. PT has renal cell carcinoma. Last chemo Wednesday. Per brother, within last month pts abdomen has increasingly getting more swollen. Pt brother also reports he feels his mentation is off. Pt had positive flu test last week

## 2017-02-08 NOTE — Discharge Instructions (Signed)
Please seek medical attention for any high fevers, chest pain, shortness of breath, change in behavior, persistent vomiting, bloody stool or any other new or concerning symptoms.  

## 2017-02-08 NOTE — ED Provider Notes (Signed)
[facility] Emergency Department Provider Note   ____________________________________________   I have reviewed the triage vital signs and the nursing notes.   HISTORY  Chief Complaint AMS  History limited by: Not Limited, some history obtained from brother   HPI KEE DRUDGE is a 62 y.o. male who presents to the emergency department today because of concern for altered mental status.  Patient has history of renal cancer. This morning his brother felt that he was more confused than he has at baseline.  His brother has also noticed some increasing abdominal distention is worried about ascites.  The patient does not have any known liver metastases or liver issues.  By the time my examination the patient was close to his baseline.  At this time patient and brother think it could have been related primarily to low blood sugar. The patient denies any abdominal pain. No fevers.    Per medical record review patient has a history of renal cell carcinoma.  Past Medical History:  Diagnosis Date  . Anemia   . BPH (benign prostatic hyperplasia)   . CKD (chronic kidney disease) stage 3, GFR 30-59 ml/min (HCC)   . Clear cell renal cell carcinoma (HCC)   . Diabetes mellitus without complication ([place])   . Dyspnea    with exertion  . GERD (gastroesophageal reflux disease)   . History of gout   . History of nephrectomy    Left  . Hypertension   . Neuropathy   . Renal cell carcinoma of left kidney (HCC)    mets to lungs  . Umbilical hernia     Patient Active Problem List   Diagnosis Date Noted  . Influenza A 01/25/2017  . Neutropenia associated with infection ([place])   . Malignant neoplasm of kidney ([place])   . Thrombocytopenia ([place])   . Iron deficiency anemia 01/23/2017  . Goals of care, counseling/discussion 11/27/2016  . Metastatic renal cell carcinoma to lung ([place])   . ARF (acute renal failure) ([place]) 09/30/2015  . Type 2 diabetes mellitus with moderate  nonproliferative diabetic retinopathy and without macular edema ([place]) 09/17/2014  . Cataract 08/20/2014  . Hypertension 07/02/2014  . CKD (chronic kidney disease) stage 3, GFR 30-59 ml/min (HCC) 11/30/2013  . Single kidney 02/04/2013    Past Surgical History:  Procedure Laterality Date  . BACK SURGERY    . ENDOBRONCHIAL ULTRASOUND N/A 10/14/2015   Procedure: ENDOBRONCHIAL ULTRASOUND;  Surgeon: [name], MD;  Location: [facility] ORS;  Service: Pulmonary;  Laterality: N/A;  . EYE SURGERY Bilateral    Cataract Extraction with IOL  . KNEE SURGERY Right   . NEPHRECTOMY      Prior to Admission medications   Medication Sig Start Date End Date Taking? Authorizing Provider  acetaminophen (TYLENOL) 325 MG tablet Take 325 mg by mouth every 6 (six) hours as needed.    [provider]  albuterol (PROVENTIL HFA;VENTOLIN HFA) 108 (90 Base) MCG/ACT inhaler Inhale 2 puffs into the lungs every 6 (six) hours as needed for wheezing or shortness of breath. 01/27/17   Gouru, Illene Silver, MD  ALPRAZolam Duanne Moron) 1 MG tablet Take 1 mg by mouth as needed for sleep. 06/10/16   [provider]  amLODipine (NORVASC) 10 MG tablet Take 10 mg by mouth daily.  01/06/16   [provider]  aspirin 81 MG chewable tablet Chew 81 mg by mouth daily.    [provider]  ciprofloxacin (CIPRO) 500 MG tablet Take 1 tablet (500 mg total) by  mouth daily. 01/28/17   Nicholes Mango, MD  colchicine 0.6 MG tablet Take 0.6 mg by mouth every other day.  06/26/15   [provider]  gabapentin (NEURONTIN) 100 MG capsule Take 200 mg by mouth 3 (three) times daily.     [provider]  guaiFENesin-dextromethorphan (ROBITUSSIN DM) 100-10 MG/5ML syrup Take 10 mLs by mouth every 4 (four) hours as needed for cough. 01/27/17   Gouru, Illene Silver, MD  insulin aspart (NOVOLOG) 100 UNIT/ML FlexPen Inject 15 Units into the skin daily.  08/28/16 08/28/17  [provider]  Insulin Detemir (LEVEMIR  FLEXTOUCH) 100 UNIT/ML Pen Inject 35 Units into the skin daily at 10 pm. 01/27/17   Gouru, Illene Silver, MD  losartan (COZAAR) 25 MG tablet Take 25 mg by mouth daily.     [provider]  lovastatin (MEVACOR) 20 MG tablet Take 20 mg by mouth daily.  07/20/15   [provider]  Nebivolol HCl 20 MG TABS Take 20 mg by mouth daily.    [provider]  ONE TOUCH ULTRA TEST test strip  10/09/16   [provider]  oxyCODONE (OXY IR/ROXICODONE) 5 MG immediate release tablet Take 1 tablet (5 mg total) by mouth every 4 (four) hours as needed for moderate pain. 01/27/17   Gouru, Illene Silver, MD  polyethylene glycol (MIRALAX / GLYCOLAX) packet Take 17 g by mouth daily as needed for mild constipation. 01/27/17   Gouru, Illene Silver, MD  Pseudoeph-Doxylamine-DM-APAP (NYQUIL PO) Take by mouth as needed.    [provider]  tamsulosin (FLOMAX) 0.4 MG CAPS capsule Take 0.4 mg by mouth every other day.  08/02/15   [provider]    Allergies Patient has no known allergies.  Family History  Problem Relation Age of Onset  . Ovarian cancer Mother   . Diabetes Father   . Hypertension Father     Social History Social History   Tobacco Use  . Smoking status: Former Smoker    Packs/day: 1.00    Types: Cigarettes  . Smokeless tobacco: Never Used  Substance Use Topics  . Alcohol use: Yes    Alcohol/week: 25.2 oz    Types: 42 Cans of beer per week    Comment: 4-5 cans of beer a day  . Drug use: No    Review of Systems Constitutional: No fever/chills Eyes: No visual changes. ENT: No sore throat. Cardiovascular: Denies chest pain. Respiratory: Denies shortness of breath. Gastrointestinal: No abdominal pain.  No nausea, no vomiting.  No diarrhea.   Genitourinary: Negative for dysuria. Musculoskeletal: Negative for back pain. Skin: Negative for rash. Neurological: Positive for confusion.  ____________________________________________   PHYSICAL EXAM:  VITAL SIGNS: ED  Triage Vitals  Enc Vitals Group     BP 02/08/17 1341 115/75     Pulse Rate 02/08/17 1341 (!) 57     Resp 02/08/17 1341 18     Temp 02/08/17 1341 (!) 97.4 F (36.3 C)     Temp Source 02/08/17 1341 Oral     SpO2 02/08/17 1341 100 %     Weight 02/08/17 1335 200 lb (90.7 kg)     Height 02/08/17 1335 5\' 11"  (1.803 m)   Constitutional: Alert and oriented. Well appearing and in no distress. Eyes: Conjunctivae are normal.  ENT   Head: Normocephalic and atraumatic.   Nose: No congestion/rhinnorhea.   Mouth/Throat: Mucous membranes are moist.   Neck: No stridor. Hematological/Lymphatic/Immunilogical: No cervical lymphadenopathy. Cardiovascular: Normal rate, regular rhythm.  No murmurs, rubs, or gallops.  Respiratory: Normal respiratory effort without tachypnea nor retractions. Breath sounds are clear and equal bilaterally. No wheezes/rales/rhonchi. Gastrointestinal: Distended. Soft and non tender. No rebound. No guarding.  Genitourinary: Deferred Musculoskeletal: Normal range of motion in all extremities. No lower extremity edema. Neurologic:  Normal speech and language. No gross focal neurologic deficits are appreciated.  Skin:  Skin is warm, dry and intact. No rash noted. Psychiatric: Mood and affect are normal. Speech and behavior are normal. Patient exhibits appropriate insight and judgment.  ____________________________________________    LABS (pertinent positives/negatives)  Trop <0.03 CBC wbc 2.2, hgb 10.5, plt 154 BMP cr 1.47, bun 16, k 4.1, na 135  ____________________________________________   EKG  I, Nance Pear, attending physician, personally viewed and interpreted this EKG  EKG Time: 1339 Rate: 57 Rhythm: sinus bradycardia Axis: left axis deviation Intervals: qtc 445 QRS: narrow ST changes: no st elevation Impression: abnormal ekg   ____________________________________________     RADIOLOGY  None  ____________________________________________   PROCEDURES  Procedures  ____________________________________________   INITIAL IMPRESSION / ASSESSMENT AND PLAN / ED COURSE  Pertinent labs & imaging results that were available during my care of the patient were reviewed by me and considered in my medical decision making (see chart for details).  Patient brought to the emergency department today with primary concern for confusion.  By the time my exam patient was at his baseline.  Blood work without concerning signs for infection or any concerning deviation from patient's baseline.  Patient and brother do have some concerns for possible ascites.  Did offer to obtain imaging however they felt comfortable deferring at this time and following up with oncologist.  I think this is reasonable.   ____________________________________________   FINAL CLINICAL IMPRESSION(S) / ED DIAGNOSES  Final diagnoses:  Altered mental status, unspecified altered mental status type  Abdominal distention     Note: This dictation was prepared with Dragon dictation. Any transcriptional errors that result from this process are unintentional     Nance Pear, MD 02/09/17 1539

## 2017-02-08 NOTE — ED Notes (Signed)
ED Provider at bedside. 

## 2017-02-12 ENCOUNTER — Ambulatory Visit: Payer: Medicare HMO

## 2017-02-13 ENCOUNTER — Telehealth: Payer: Self-pay | Admitting: *Deleted

## 2017-02-13 DIAGNOSIS — R188 Other ascites: Principal | ICD-10-CM

## 2017-02-13 DIAGNOSIS — K7460 Unspecified cirrhosis of liver: Secondary | ICD-10-CM

## 2017-02-13 NOTE — Telephone Encounter (Signed)
Phone call to patient to advise he needs to be at Registration tomorrow morning at 9 AM to get registered  For his paracentesis. Also informed him that GI referral has been mad. He repeated time back to me and agrees to this appt

## 2017-02-13 NOTE — Telephone Encounter (Signed)
Yes, please order u/s guided paracentesis.  Pt also need a GI referral for his cirrhosis.

## 2017-02-13 NOTE — Telephone Encounter (Signed)
Orders entered and Para order and H&P faxed to specialty schedulers

## 2017-02-13 NOTE — Telephone Encounter (Signed)
Patient called asking that we set him up for a paracentesis ASAP. Please advise

## 2017-02-14 ENCOUNTER — Other Ambulatory Visit: Payer: Self-pay | Admitting: *Deleted

## 2017-02-14 ENCOUNTER — Ambulatory Visit
Admission: RE | Admit: 2017-02-14 | Discharge: 2017-02-14 | Disposition: A | Discharge status: Home / Self Care | Payer: Medicare HMO | Source: Ambulatory Visit | Attending: Oncology | Admitting: Oncology

## 2017-02-14 DIAGNOSIS — K7460 Unspecified cirrhosis of liver: Secondary | ICD-10-CM | POA: Diagnosis not present

## 2017-02-14 DIAGNOSIS — C7800 Secondary malignant neoplasm of unspecified lung: Principal | ICD-10-CM

## 2017-02-14 DIAGNOSIS — R188 Other ascites: Secondary | ICD-10-CM | POA: Diagnosis not present

## 2017-02-14 DIAGNOSIS — C649 Malignant neoplasm of unspecified kidney, except renal pelvis: Secondary | ICD-10-CM

## 2017-02-14 NOTE — Procedures (Signed)
Ascites  S/p [place] para lg vol  No comp Stable Full report in pacs

## 2017-02-14 NOTE — Progress Notes (Signed)
NA

## 2017-02-17 DIAGNOSIS — K7460 Unspecified cirrhosis of liver: Secondary | ICD-10-CM | POA: Insufficient documentation

## 2017-02-17 NOTE — Progress Notes (Signed)
[place]  [phone] [phone]  ID: Rodney Stewart OB: Mar 05, 1955  MR#: [id number]  ACZ#:[id number]  Patient Care Team: System, Pcp Not In as PCP - General  CHIEF COMPLAINT: Stage IV renal cell carcinoma with bilateral lung metastases.  INTERVAL HISTORY: Patient returns to clinic today for further evaluation and consideration of cycle 23 of nivolumab and discussion of his imaging results. He continues to tolerate his treatments well without significant side effects. He continues to have significant peripheral neuropathy in both feet secondary to diabetes, but has no other neurologic complaints.  He reports his blood glucose control is improved.  His ascites has become worse recently had a greater than 10 L paracentesis.  He otherwise feels well.  He denies any fevers, chills, or recent illnesses. He has a good appetite and denies weight loss. He denies any chest pain, shortness of breath, cough, or hemoptysis. He has no nausea, vomiting, constipation, or diarrhea. He has no melena or hematochezia. He has no urinary complaints. Patient offers no further specific complaints today.   REVIEW OF SYSTEMS:   Review of Systems  Constitutional: Negative.  Negative for fever, malaise/fatigue and weight loss.  Eyes: Negative for blurred vision.  Respiratory: Negative for cough and shortness of breath.   Cardiovascular: Negative.  Negative for chest pain and leg swelling.  Gastrointestinal: Negative.  Negative for abdominal pain, constipation, diarrhea, nausea and vomiting.  Genitourinary: Negative.   Musculoskeletal: Negative.   Neurological: Positive for tingling and sensory change. Negative for weakness and headaches.  Psychiatric/Behavioral: Negative.  The patient is not nervous/anxious and does not have insomnia.    As per HPI. Otherwise, a complete review of systems is negative.   PAST MEDICAL HISTORY: Past Medical History:  Diagnosis Date  . Anemia    . BPH (benign prostatic hyperplasia)   . CKD (chronic kidney disease) stage 3, GFR 30-59 ml/min (HCC)   . Clear cell renal cell carcinoma (HCC)   . Diabetes mellitus without complication ([place])   . Dyspnea    with exertion  . GERD (gastroesophageal reflux disease)   . History of gout   . History of nephrectomy    Left  . Hypertension   . Neuropathy   . Renal cell carcinoma of left kidney (HCC)    mets to lungs  . Umbilical hernia     PAST SURGICAL HISTORY: Past Surgical History:  Procedure Laterality Date  . BACK SURGERY    . ENDOBRONCHIAL ULTRASOUND N/A 10/14/2015   Procedure: ENDOBRONCHIAL ULTRASOUND;  Surgeon: [name], MD;  Location: [facility] ORS;  Service: Pulmonary;  Laterality: N/A;  . EYE SURGERY Bilateral    Cataract Extraction with IOL  . KNEE SURGERY Right   . NEPHRECTOMY      FAMILY HISTORY: Family History  Problem Relation Age of Onset  . Ovarian cancer Mother   . Diabetes Father   . Hypertension Father     ADVANCED DIRECTIVES (Y/N):  N  HEALTH MAINTENANCE: Social History   Tobacco Use  . Smoking status: Former Smoker    Packs/day: 1.00    Types: Cigarettes  . Smokeless tobacco: Never Used  Substance Use Topics  . Alcohol use: Yes    Alcohol/week: 25.2 oz    Types: 42 Cans of beer per week    Comment: 4-5 cans of beer a day  . Drug use: No     Colonoscopy:  PAP:  Bone density:  Lipid panel:  No Known Allergies  Current Outpatient Medications  Medication Sig Dispense Refill  . acetaminophen (TYLENOL) 325 MG tablet Take 325 mg by mouth every 6 (six) hours as needed.    . ALPRAZolam (XANAX) 1 MG tablet Take 1 mg by mouth as needed for sleep.    Marland Kitchen amLODipine (NORVASC) 10 MG tablet Take 10 mg by mouth daily.     Marland Kitchen aspirin 81 MG chewable tablet Chew 81 mg by mouth daily.    . colchicine 0.6 MG tablet Take 0.6 mg by mouth every other day.     . furosemide (LASIX) 20 MG tablet Take 1 tablet (20 mg total) by mouth daily. 59 tablet 0    . gabapentin (NEURONTIN) 100 MG capsule Take 200 mg by mouth 3 (three) times daily.     . insulin aspart (NOVOLOG) 100 UNIT/ML FlexPen Inject 15 Units into the skin daily.     . Insulin Detemir (LEVEMIR FLEXTOUCH) 100 UNIT/ML Pen Inject 35 Units into the skin daily at 10 pm. 15 mL 11  . lovastatin (MEVACOR) 20 MG tablet Take 20 mg by mouth daily.     . ONE TOUCH ULTRA TEST test strip     . Pseudoeph-Doxylamine-DM-APAP (NYQUIL PO) Take by mouth as needed.    . tamsulosin (FLOMAX) 0.4 MG CAPS capsule Take 0.4 mg by mouth every other day.     . albuterol (PROVENTIL HFA;VENTOLIN HFA) 108 (90 Base) MCG/ACT inhaler Inhale 2 puffs into the lungs every 6 (six) hours as needed for wheezing or shortness of breath. (Patient not taking: Reported on 02/19/2017) 1 Inhaler 0  . fluconazole (DIFLUCAN) 150 MG tablet Take 1 tablet (150 mg total) by mouth daily. 3 tablet 0  . lidocaine (XYLOCAINE) 2 % solution Use as directed 10 mLs in the mouth or throat as needed for mouth pain. 100 mL 0  . Nebivolol HCl 20 MG TABS Take 20 mg by mouth daily.    . polyethylene glycol (MIRALAX / GLYCOLAX) packet Take 17 g by mouth daily as needed for mild constipation. (Patient not taking: Reported on 02/19/2017) 14 each 0  . spironolactone (ALDACTONE) 50 MG tablet Take 1 tablet (50 mg total) by mouth daily. (Patient not taking: Reported on 02/20/2017) 30 tablet 1   Current Facility-Administered Medications  Medication Dose Route Frequency Provider Last Rate Last Dose  . albumin human 5 % solution 25 g  25 g Intravenous Once Jonathon Bellows, MD       Facility-Administered Medications Ordered in Other Visits  Medication Dose Route Frequency Provider Last Rate Last Dose  . 0.9 %  sodium chloride infusion   Intravenous Once Lloyd Huger, MD   Stopped at 02/20/17 1155  . nivolumab (OPDIVO) 240 mg in sodium chloride 0.9 % 100 mL chemo infusion  240 mg Intravenous Once Lloyd Huger, MD   Stopped at 02/20/17 1152     OBJECTIVE: Vitals:   02/20/17 1022  BP: 123/80  Pulse: 65  Resp: 18  Temp: (!) 97.3 F (36.3 C)  SpO2: 99%     Body mass index is 25.52 kg/m.    ECOG FS:0 - Asymptomatic  General: Well-developed, well-nourished, no acute distress. Eyes: Pink conjunctiva, anicteric sclera. Lungs: Clear to auscultation bilaterally. Heart: Regular rate and rhythm. No rubs, murmurs, or gallops. Abdomen: Distended, nontender.   Musculoskeletal: No edema, cyanosis, or clubbing. Neuro: Alert, answering all questions appropriately. Cranial nerves grossly intact. Skin: No rashes or petechiae noted. Psych: Normal affect.    LAB RESULTS:  Lab Results  Component Value Date  NA 135 02/20/2017   K 4.9 02/20/2017   CL 104 02/20/2017   CO2 28 02/20/2017   GLUCOSE 53 (L) 02/20/2017   BUN 17 02/20/2017   CREATININE 1.37 (H) 02/20/2017   CALCIUM 8.5 (L) 02/20/2017   PROT 6.9 02/20/2017   ALBUMIN 2.9 (L) 02/20/2017   AST 39 02/20/2017   ALT 16 (L) 02/20/2017   ALKPHOS 293 (H) 02/20/2017   BILITOT 1.0 02/20/2017   GFRNONAA 55 (L) 02/20/2017   GFRAA >60 02/20/2017    Lab Results  Component Value Date   WBC 2.7 (L) 02/20/2017   NEUTROABS 1.3 (L) 02/20/2017   HGB 10.8 (L) 02/20/2017   HCT 33.7 (L) 02/20/2017   MCV 81.6 02/20/2017   PLT 122 (L) 02/20/2017   Lab Results  Component Value Date   IRON 12 (L) 01/15/2017   TIBC 410 01/15/2017   IRONPCTSAT 3 (L) 01/15/2017   Lab Results  Component Value Date   FERRITIN 12 (L) 01/15/2017     STUDIES: Dg Chest 2 View  Result Date: 01/25/2017 CLINICAL DATA:  Cough and congestion with shortness of breath for 2 days. EXAM: CHEST  2 VIEW COMPARISON:  10/15/2016 PET CT and 10/14/2015 chest radiograph FINDINGS: The cardiopericardial silhouette is unremarkable. Fullness of both hilar regions again noted. Right upper lobe and left lower lobe nodules are again noted and do not appear significantly changed in size since 10/15/2016 PET CT. Other known  nodules are difficult to visualized. There is no evidence of airspace disease, pleural effusion or pneumothorax. IMPRESSION: 1. No evidence of acute cardiopulmonary disease 2. Pulmonary nodules and bilateral hilar fullness again noted, do not appear significantly changed since 10/15/2016 PET-CT. Electronically Signed   By: Margarette Canada M.D.   On: 01/25/2017 08:46   Ct Chest W Contrast  Result Date: 02/18/2017 CLINICAL DATA:  Metastatic renal cell carcinoma. EXAM: CT CHEST, ABDOMEN, AND PELVIS WITH CONTRAST TECHNIQUE: Multidetector CT imaging of the chest, abdomen and pelvis was performed following the standard protocol during bolus administration of intravenous contrast. CONTRAST:  183m ISOVUE-300 IOPAMIDOL (ISOVUE-300) INJECTION 61% COMPARISON:  PET-CT 10/15/2016. FINDINGS: CT CHEST FINDINGS Cardiovascular: The heart size is normal. No pericardial effusion. Coronary artery calcification is evident. Atherosclerotic calcification is noted in the wall of the thoracic aorta. Mediastinum/Nodes: 19 mm short axis subcarinal lymph node decreased from 24 mm on prior study. 19 mm short axis left hilar lymph node better demonstrated on today's study given intravenous contrast material. 13 mm short axis right hilar lymph node evident. The esophagus has normal imaging features. There is no axillary lymphadenopathy. Lungs/Pleura: Multiple bilateral pulmonary nodules again noted. A lobular right upper lobe nodule measured previously at 19 mm is stable, measuring 19 mm today. Another prominent nodule identified in the left lower lobe measures 2.5 cm on today's study (image 85 series 6) compared to 2.4 cm previously. Other scattered bilateral smaller pulmonary nodules are similar to prior. No definite new or progressive pulmonary nodule or mass. Musculoskeletal: Bone windows reveal no worrisome lytic or sclerotic osseous lesions. CT ABDOMEN PELVIS FINDINGS Hepatobiliary: As before, liver contour is mildly irregular suggesting  cirrhosis. No hypervascular lesion identified within the liver parenchyma contracted gallbladder contains multiple tiny calcified stones. No intrahepatic or extrahepatic biliary dilation. Pancreas: No focal mass lesion. No dilatation of the main duct. No intraparenchymal cyst. No peripancreatic edema. Spleen: Spleen measures 18.4 cm craniocaudal length. No focal abnormality within the splenic parenchyma. Adrenals/Urinary Tract: Right adrenal gland normal. Left adrenal gland surgically absent. Left kidney  surgically absent. Right kidney unremarkable. Right ureter is nondilated. Bladder decompressed. Stomach/Bowel: Stomach is nondistended. No gastric wall thickening. No evidence of outlet obstruction. Duodenum is normally positioned as is the ligament of Treitz. No small bowel wall thickening. No small bowel dilatation. The terminal ileum is normal. The appendix is normal. No gross colonic mass. No colonic wall thickening. No substantial diverticular change. Vascular/Lymphatic: There is abdominal aortic atherosclerosis without aneurysm. There is no gastrohepatic or hepatoduodenal ligament lymphadenopathy. No intraperitoneal or retroperitoneal lymphadenopathy. No pelvic sidewall lymphadenopathy. Reproductive: The prostate gland and seminal vesicles have normal imaging features. Other: Large volume ascites identified. Umbilical hernia contains fat and fluid. Left groin hernia contains fluid. Musculoskeletal: Similar appearance of 15 mm lucent lesion left acetabulum. Degenerative changes are noted in the lumbar spine. IMPRESSION: 1. Slight decrease in size of subcarinal lymphadenopathy. There is bilateral hilar lymphadenopathy, better demonstrated on today's postcontrast CT than previous multiple prior noncontrast PET CTs. 2. Multiple stable bilateral pulmonary nodules including 2.5 cm left lower lobe lesion. 3. Status post left nephrectomy. 4. Morphologic changes in the liver suggest cirrhosis. Splenomegaly and  recanalization of the paraumbilical vein suggest portal venous hypertension 5. Large volume ascites, progressed in the interval 6. Cholelithiasis. 7. Umbilical and left groin hernias containing fluid. 8.  Aortic Atherosclerois (ICD10-170.0) Electronically Signed   By: Misty Stanley M.D.   On: 02/18/2017 13:41   Ct Abdomen Pelvis W Contrast  Result Date: 02/18/2017 CLINICAL DATA:  Metastatic renal cell carcinoma. EXAM: CT CHEST, ABDOMEN, AND PELVIS WITH CONTRAST TECHNIQUE: Multidetector CT imaging of the chest, abdomen and pelvis was performed following the standard protocol during bolus administration of intravenous contrast. CONTRAST:  115m ISOVUE-300 IOPAMIDOL (ISOVUE-300) INJECTION 61% COMPARISON:  PET-CT 10/15/2016. FINDINGS: CT CHEST FINDINGS Cardiovascular: The heart size is normal. No pericardial effusion. Coronary artery calcification is evident. Atherosclerotic calcification is noted in the wall of the thoracic aorta. Mediastinum/Nodes: 19 mm short axis subcarinal lymph node decreased from 24 mm on prior study. 19 mm short axis left hilar lymph node better demonstrated on today's study given intravenous contrast material. 13 mm short axis right hilar lymph node evident. The esophagus has normal imaging features. There is no axillary lymphadenopathy. Lungs/Pleura: Multiple bilateral pulmonary nodules again noted. A lobular right upper lobe nodule measured previously at 19 mm is stable, measuring 19 mm today. Another prominent nodule identified in the left lower lobe measures 2.5 cm on today's study (image 85 series 6) compared to 2.4 cm previously. Other scattered bilateral smaller pulmonary nodules are similar to prior. No definite new or progressive pulmonary nodule or mass. Musculoskeletal: Bone windows reveal no worrisome lytic or sclerotic osseous lesions. CT ABDOMEN PELVIS FINDINGS Hepatobiliary: As before, liver contour is mildly irregular suggesting cirrhosis. No hypervascular lesion identified  within the liver parenchyma contracted gallbladder contains multiple tiny calcified stones. No intrahepatic or extrahepatic biliary dilation. Pancreas: No focal mass lesion. No dilatation of the main duct. No intraparenchymal cyst. No peripancreatic edema. Spleen: Spleen measures 18.4 cm craniocaudal length. No focal abnormality within the splenic parenchyma. Adrenals/Urinary Tract: Right adrenal gland normal. Left adrenal gland surgically absent. Left kidney surgically absent. Right kidney unremarkable. Right ureter is nondilated. Bladder decompressed. Stomach/Bowel: Stomach is nondistended. No gastric wall thickening. No evidence of outlet obstruction. Duodenum is normally positioned as is the ligament of Treitz. No small bowel wall thickening. No small bowel dilatation. The terminal ileum is normal. The appendix is normal. No gross colonic mass. No colonic wall thickening. No substantial diverticular change. Vascular/Lymphatic:  There is abdominal aortic atherosclerosis without aneurysm. There is no gastrohepatic or hepatoduodenal ligament lymphadenopathy. No intraperitoneal or retroperitoneal lymphadenopathy. No pelvic sidewall lymphadenopathy. Reproductive: The prostate gland and seminal vesicles have normal imaging features. Other: Large volume ascites identified. Umbilical hernia contains fat and fluid. Left groin hernia contains fluid. Musculoskeletal: Similar appearance of 15 mm lucent lesion left acetabulum. Degenerative changes are noted in the lumbar spine. IMPRESSION: 1. Slight decrease in size of subcarinal lymphadenopathy. There is bilateral hilar lymphadenopathy, better demonstrated on today's postcontrast CT than previous multiple prior noncontrast PET CTs. 2. Multiple stable bilateral pulmonary nodules including 2.5 cm left lower lobe lesion. 3. Status post left nephrectomy. 4. Morphologic changes in the liver suggest cirrhosis. Splenomegaly and recanalization of the paraumbilical vein suggest  portal venous hypertension 5. Large volume ascites, progressed in the interval 6. Cholelithiasis. 7. Umbilical and left groin hernias containing fluid. 8.  Aortic Atherosclerois (ICD10-170.0) Electronically Signed   By: Misty Stanley M.D.   On: 02/18/2017 13:41   US Paracentesis  Result Date: 02/14/2017 INDICATION: Cirrhosis, ascites, abdominal distension EXAM: ULTRASOUND GUIDED PARACENTESIS MEDICATIONS: None. COMPLICATIONS: None immediate. PROCEDURE: An ultrasound guided paracentesis was thoroughly discussed with the patient and questions answered. The benefits, risks, alternatives and complications were also discussed. The patient understands and wishes to proceed with the procedure. Written consent was obtained. Ultrasound was performed to localize and mark an adequate pocket of fluid in the right lower quadrant of the abdomen. The area was then prepped and draped in the normal sterile fashion. 1% Lidocaine was used for local anesthesia. Under ultrasound guidance a 19 gauge Yueh catheter was introduced. Paracentesis was performed. The catheter was removed and a dressing applied. FINDINGS: A total of approximately 10.2 L of clear peritoneal fluid was removed. A fluid sample was not sent for laboratory analysis. IMPRESSION: Successful ultrasound guided paracentesis yielding 10.2 L of ascites. Electronically Signed   By: Jerilynn Mages.  Shick M.D.   On: 02/14/2017 11:40     Oncology history: Patient underwent left nephrectomy on May 16, 2012 which revealed a clear cell grade 2 renal cell carcinoma, stage TIIIa, N0, M0. Tumor size of 16 cm. Patient was noted to have renal vein involvement, but no other structures were involved. 0 of 2 lymph nodes were negative for disease. PET scan on October 20, 2015 revealed metastatic disease and patient was initiated on Votrient. This was subsequently discontinued in March 2018 secondary to progression of disease. Patient initiated second line treatment with nivolumab on Monday, April 02, 2016  ASSESSMENT: Stage IV left renal cell carcinoma with metastasis to the lungs  PLAN:    1. Stage IV left renal cell carcinoma with metastasis to the lungs: CT scan results from February 18, 2017 reviewed independently and report as above with continued improvement of disease burden. Proceed with cycle 23 of nivolumab today. Patient will receive a flat dose of 240 mg every 2 weeks until intolerable side effects or progression of disease. Return to clinic in 2 weeks for consideration of cycle 24 and then in 4 weeks for further evaluation and consideration of cycle 25.  Patient's insurance will no longer cover PET scan's, therefore we will continue monitoring disease status with CT scans every 3-4 months. 2. Pancytopenia: Patient's blood counts are decreased, but essentially stable.  Possibly secondary to his underlying cirrhosis.  Can consider bone marrow biopsy in the future if necessary.   3. Renal insufficiency: Patient's creatinine is approximately at his baseline. Continue to monitor closely.  4.  Peripheral neuropathy: Likely secondary to diabetes, continue current dose of gabapentin. Patient plans to discuss with his primary care adjusting his medications possibly pursuing Lyrica. Monitor. 5.  Cirrhosis: Patient had a 10 L paracentesis earlier this week.  Appreciate GI input. 5. Hyperbilirubinemia: Secondary cirrhosis, monitor.  6. Hyperglycemia: Improved. Unrelated to current treatment. Continue close follow-up with primary care. 7.  Hyponatremia: Resolved.  Monitor. 8.  Iron deficiency anemia: Patient received 510 mg IV Feraheme on January 23, 2017.  Monitor.   Patient expressed understanding and was in agreement with this plan. He also understands that He can call clinic at any time with any questions, concerns, or complaints.   Cancer Staging Metastatic renal cell carcinoma to lung [facility]) Staging form: Kidney, AJCC 7th Edition - Clinical stage from 10/12/2015: Stage IV ([place], N0, M1)  - Signed by Lloyd Huger, MD on 10/12/2015   Lloyd Huger, MD 02/20/17 11:44 AM

## 2017-02-18 ENCOUNTER — Ambulatory Visit
Admission: RE | Admit: 2017-02-18 | Discharge: 2017-02-18 | Disposition: A | Discharge status: Home / Self Care | Payer: Medicare HMO | Source: Ambulatory Visit | Attending: Oncology | Admitting: Oncology

## 2017-02-18 DIAGNOSIS — K8020 Calculus of gallbladder without cholecystitis without obstruction: Secondary | ICD-10-CM | POA: Diagnosis not present

## 2017-02-18 DIAGNOSIS — K429 Umbilical hernia without obstruction or gangrene: Secondary | ICD-10-CM | POA: Insufficient documentation

## 2017-02-18 DIAGNOSIS — C649 Malignant neoplasm of unspecified kidney, except renal pelvis: Secondary | ICD-10-CM | POA: Diagnosis not present

## 2017-02-18 DIAGNOSIS — C7800 Secondary malignant neoplasm of unspecified lung: Secondary | ICD-10-CM | POA: Diagnosis not present

## 2017-02-18 DIAGNOSIS — R161 Splenomegaly, not elsewhere classified: Secondary | ICD-10-CM | POA: Diagnosis not present

## 2017-02-18 DIAGNOSIS — R590 Localized enlarged lymph nodes: Secondary | ICD-10-CM | POA: Diagnosis not present

## 2017-02-18 DIAGNOSIS — R188 Other ascites: Secondary | ICD-10-CM | POA: Insufficient documentation

## 2017-02-18 DIAGNOSIS — I700 Atherosclerosis of aorta: Secondary | ICD-10-CM | POA: Diagnosis not present

## 2017-02-18 DIAGNOSIS — K4090 Unilateral inguinal hernia, without obstruction or gangrene, not specified as recurrent: Secondary | ICD-10-CM | POA: Diagnosis not present

## 2017-02-18 DIAGNOSIS — Z905 Acquired absence of kidney: Secondary | ICD-10-CM | POA: Diagnosis not present

## 2017-02-18 DIAGNOSIS — R918 Other nonspecific abnormal finding of lung field: Secondary | ICD-10-CM | POA: Insufficient documentation

## 2017-02-18 DIAGNOSIS — K7460 Unspecified cirrhosis of liver: Secondary | ICD-10-CM | POA: Diagnosis not present

## 2017-02-18 MED ORDER — IOPAMIDOL (ISOVUE-300) INJECTION 61%
100.0000 mL | Freq: Once | INTRAVENOUS | Status: AC | PRN
  Administered 2017-02-18: 100 mL via INTRAVENOUS

## 2017-02-19 ENCOUNTER — Other Ambulatory Visit
Admission: RE | Admit: 2017-02-19 | Discharge: 2017-02-19 | Disposition: A | Discharge status: Home / Self Care | Payer: Medicare HMO | Source: Ambulatory Visit | Attending: Gastroenterology | Admitting: Gastroenterology

## 2017-02-19 ENCOUNTER — Ambulatory Visit: Payer: Medicare HMO | Admitting: Gastroenterology

## 2017-02-19 ENCOUNTER — Encounter: Payer: Self-pay | Admitting: Gastroenterology

## 2017-02-19 VITALS — BP 121/82 | HR 68 | Temp 98.2°F | Ht 71.0 in | Wt 182.4 lb

## 2017-02-19 DIAGNOSIS — R188 Other ascites: Secondary | ICD-10-CM | POA: Insufficient documentation

## 2017-02-19 DIAGNOSIS — K7460 Unspecified cirrhosis of liver: Secondary | ICD-10-CM

## 2017-02-19 LAB — RAPID HIV SCREEN (HIV 1/2 AB+AG)
HIV 1/2 Antibodies: NONREACTIVE
HIV-1 P24 Antigen - HIV24: NONREACTIVE

## 2017-02-19 LAB — TSH: TSH: 10.244 u[IU]/mL — AB (ref 0.350–4.500)

## 2017-02-19 MED ORDER — ALBUMIN HUMAN 5 % IV SOLN: 25.0000 g | Freq: Once | INTRAVENOUS | Status: DC

## 2017-02-19 MED ORDER — SPIRONOLACTONE 50 MG PO TABS: 50.0000 mg | ORAL_TABLET | Freq: Every day | ORAL | 1 refills | Status: DC

## 2017-02-19 MED ORDER — FUROSEMIDE 20 MG PO TABS: 20.0000 mg | ORAL_TABLET | Freq: Every day | ORAL | 0 refills | Status: DC

## 2017-02-19 NOTE — Progress Notes (Signed)
Jonathon Bellows MD, MRCP(U.K) [address]  [place]  [place], [place] [address]  Main: [phone]  Fax: [phone]   Gastroenterology Consultation  Referring Provider:     No ref. provider found Primary Care Physician:  System, Pcp Not In Primary Gastroenterologist:  Dr. Jonathon Bellows  Reason for Consultation:     Cirrhosis of liver         HPI:   Rodney Stewart is a 62 y.o. y/o male referred   Summary of history :  He has been referred for chronic liver disease and cirrhosis . He has a history of stage 4 RCC , b/l lung metastasis . CKD 3 and Diabetes Mellitus , on chemotherapy. He has pancytopenia and follows with Dr Grayland Ormond.   CT scan of the abdomen done on 02/18/2017 showed subcarinal lymphadenopathy.  Multiple stable bilateral pulmonary nodules.  Status post left nephrectomy.  Liver features suggestive of cirrhosis splenomegaly recanalization of the paraumbilical vein.  Large volume ascites.  Cholelithiasis.  He underwent ultrasound paracentesis for 10.2 L on 02/14/2017.  Labs on 02/08/2017 demonstrated hemoglobin of 10.5 with an MCV of 79.1 and a white cell count of 2.2. Cr 1.41 , elevated alkaline phosphatase and Tbilirubin 1.7 .    He has been aware of cirrhosis of the liver till recently. He consumes "alcohol all my life, fairly heavy drinker", consumed 6-7 beers a day x many years atleast 30 years.2 beers in the last two weeks Abdomen developed swelling last 2 weeks. No tatoos, no illegal drug use. No liver diseases in the family. No over the counter herbal medications.   Consumes frozen meals, restaurant  Take out food.      Past Medical History:  Diagnosis Date  . Anemia   . BPH (benign prostatic hyperplasia)   . CKD (chronic kidney disease) stage 3, GFR 30-59 ml/min (HCC)   . Clear cell renal cell carcinoma (HCC)   . Diabetes mellitus without complication ([facility])   . Dyspnea    with exertion  . GERD (gastroesophageal reflux disease)   . History of gout   .  History of nephrectomy    Left  . Hypertension   . Neuropathy   . Renal cell carcinoma of left kidney (HCC)    mets to lungs  . Umbilical hernia     Past Surgical History:  Procedure Laterality Date  . BACK SURGERY    . ENDOBRONCHIAL ULTRASOUND N/A 10/14/2015   Procedure: ENDOBRONCHIAL ULTRASOUND;  Surgeon: [name], MD;  Location: [facility] ORS;  Service: Pulmonary;  Laterality: N/A;  . EYE SURGERY Bilateral    Cataract Extraction with IOL  . KNEE SURGERY Right   . NEPHRECTOMY      Prior to Admission medications   Medication Sig Start Date End Date Taking? Authorizing Provider  acetaminophen (TYLENOL) 325 MG tablet Take 325 mg by mouth every 6 (six) hours as needed.   Yes [provider]  ALPRAZolam Duanne Moron) 1 MG tablet Take 1 mg by mouth as needed for sleep. 06/10/16  Yes [provider]  amLODipine (NORVASC) 10 MG tablet Take 10 mg by mouth daily.  01/06/16  Yes [provider]  aspirin 81 MG chewable tablet Chew 81 mg by mouth daily.   Yes [provider]  ciprofloxacin (CIPRO) 500 MG tablet Take 1 tablet (500 mg total) by mouth daily. 01/28/17  Yes Gouru, Illene Silver, MD  colchicine 0.6 MG tablet Take 0.6 mg by mouth every other day.  06/26/15  Yes [provider]  gabapentin (NEURONTIN) 100 MG capsule Take 200 mg by mouth 3 (three) times daily.    Yes [provider]  guaiFENesin-dextromethorphan (ROBITUSSIN DM) 100-10 MG/5ML syrup Take 10 mLs by mouth every 4 (four) hours as needed for cough. 01/27/17  Yes Gouru, Aruna, MD  insulin aspart (NOVOLOG) 100 UNIT/ML FlexPen Inject 15 Units into the skin daily.  08/28/16 08/28/17 Yes [provider]  Insulin Detemir (LEVEMIR FLEXTOUCH) 100 UNIT/ML Pen Inject 35 Units into the skin daily at 10 pm. 01/27/17  Yes Gouru, Aruna, MD  lovastatin (MEVACOR) 20 MG tablet Take 20 mg by mouth daily.  07/20/15  Yes [provider]  Nebivolol HCl 20 MG TABS Take 20 mg by mouth daily.    Yes [provider]  ONE TOUCH ULTRA TEST test strip  10/09/16  Yes [provider]  tamsulosin (FLOMAX) 0.4 MG CAPS capsule Take 0.4 mg by mouth every other day.  08/02/15  Yes [provider]  albuterol (PROVENTIL HFA;VENTOLIN HFA) 108 (90 Base) MCG/ACT inhaler Inhale 2 puffs into the lungs every 6 (six) hours as needed for wheezing or shortness of breath. Patient not taking: Reported on 02/19/2017 01/27/17   Nicholes Mango, MD  furosemide (LASIX) 20 MG tablet Take 1 tablet (20 mg total) by mouth daily. 02/19/17 04/19/17  Jonathon Bellows, MD  oxyCODONE (OXY IR/ROXICODONE) 5 MG immediate release tablet Take 1 tablet (5 mg total) by mouth every 4 (four) hours as needed for moderate pain. Patient not taking: Reported on 02/19/2017 01/27/17   Nicholes Mango, MD  polyethylene glycol (MIRALAX / GLYCOLAX) packet Take 17 g by mouth daily as needed for mild constipation. Patient not taking: Reported on 02/19/2017 01/27/17   Nicholes Mango, MD  Pseudoeph-Doxylamine-DM-APAP (NYQUIL PO) Take by mouth as needed.    [provider]  spironolactone (ALDACTONE) 50 MG tablet Take 1 tablet (50 mg total) by mouth daily. 02/19/17 04/19/17  Jonathon Bellows, MD    Family History  Problem Relation Age of Onset  . Ovarian cancer Mother   . Diabetes Father   . Hypertension Father      Social History   Tobacco Use  . Smoking status: Former Smoker    Packs/day: 1.00    Types: Cigarettes  . Smokeless tobacco: Never Used  Substance Use Topics  . Alcohol use: Yes    Alcohol/week: 25.2 oz    Types: 42 Cans of beer per week    Comment: 4-5 cans of beer a day  . Drug use: No    Allergies as of 02/19/2017  . (No Known Allergies)    Review of Systems:    All systems reviewed and negative except where noted in HPI.   Physical Exam:  BP 121/82 (BP Location: Left Arm, Patient Position: Sitting, Cuff Size: Normal)   Pulse 68   Temp 98.2 F (36.8 C) (Oral)   Ht 5\' 11"  (1.803 m)   Wt 182 lb 6.4 oz  (82.7 kg)   BMI 25.44 kg/m  No LMP for male patient. Psych:  Alert and cooperative. Normal mood and affect. General:   Alert,  Well-developed, well-nourished, pleasant and cooperative in NAD Head:  Normocephalic and atraumatic. Eyes:  Sclera clear, no icterus.   Conjunctiva pink. Ears:  Normal auditory acuity. Nose:  No deformity, discharge, or lesions. Mouth:  No deformity or lesions,oropharynx pink & moist. Neck:  Supple; no masses or thyromegaly. Lungs:  Respirations even and unlabored.  Clear throughout to auscultation.   No wheezes,  crackles, or rhonchi. No acute distress. Heart:  Regular rate and rhythm; no murmurs, clicks, rubs, or gallops. Abdomen:  Normal bowel sounds.  No bruits.  Soft, distended without masses, hepatosplenomegaly or hernias noted.  No guarding or rebound tenderness.  Tense ascites + 0Neurologic:  Alert and oriented x3;  grossly normal neurologically. Skin:  Intact without significant lesions or rashes. No jaundice. Lymph Nodes:  No significant cervical adenopathy. Psych:  Alert and cooperative. Normal mood and affect.  Imaging Studies: Dg Chest 2 View  Result Date: 01/25/2017 CLINICAL DATA:  Cough and congestion with shortness of breath for 2 days. EXAM: CHEST  2 VIEW COMPARISON:  10/15/2016 PET CT and 10/14/2015 chest radiograph FINDINGS: The cardiopericardial silhouette is unremarkable. Fullness of both hilar regions again noted. Right upper lobe and left lower lobe nodules are again noted and do not appear significantly changed in size since 10/15/2016 PET CT. Other known nodules are difficult to visualized. There is no evidence of airspace disease, pleural effusion or pneumothorax. IMPRESSION: 1. No evidence of acute cardiopulmonary disease 2. Pulmonary nodules and bilateral hilar fullness again noted, do not appear significantly changed since 10/15/2016 PET-CT. Electronically Signed   By: Margarette Canada M.D.   On: 01/25/2017 08:46   Ct Chest W Contrast  Result  Date: 02/18/2017 CLINICAL DATA:  Metastatic renal cell carcinoma. EXAM: CT CHEST, ABDOMEN, AND PELVIS WITH CONTRAST TECHNIQUE: Multidetector CT imaging of the chest, abdomen and pelvis was performed following the standard protocol during bolus administration of intravenous contrast. CONTRAST:  174mL ISOVUE-300 IOPAMIDOL (ISOVUE-300) INJECTION 61% COMPARISON:  PET-CT 10/15/2016. FINDINGS: CT CHEST FINDINGS Cardiovascular: The heart size is normal. No pericardial effusion. Coronary artery calcification is evident. Atherosclerotic calcification is noted in the wall of the thoracic aorta. Mediastinum/Nodes: 19 mm short axis subcarinal lymph node decreased from 24 mm on prior study. 19 mm short axis left hilar lymph node better demonstrated on today's study given intravenous contrast material. 13 mm short axis right hilar lymph node evident. The esophagus has normal imaging features. There is no axillary lymphadenopathy. Lungs/Pleura: Multiple bilateral pulmonary nodules again noted. A lobular right upper lobe nodule measured previously at 19 mm is stable, measuring 19 mm today. Another prominent nodule identified in the left lower lobe measures 2.5 cm on today's study (image 85 series 6) compared to 2.4 cm previously. Other scattered bilateral smaller pulmonary nodules are similar to prior. No definite new or progressive pulmonary nodule or mass. Musculoskeletal: Bone windows reveal no worrisome lytic or sclerotic osseous lesions. CT ABDOMEN PELVIS FINDINGS Hepatobiliary: As before, liver contour is mildly irregular suggesting cirrhosis. No hypervascular lesion identified within the liver parenchyma contracted gallbladder contains multiple tiny calcified stones. No intrahepatic or extrahepatic biliary dilation. Pancreas: No focal mass lesion. No dilatation of the main duct. No intraparenchymal cyst. No peripancreatic edema. Spleen: Spleen measures 18.4 cm craniocaudal length. No focal abnormality within the splenic  parenchyma. Adrenals/Urinary Tract: Right adrenal gland normal. Left adrenal gland surgically absent. Left kidney surgically absent. Right kidney unremarkable. Right ureter is nondilated. Bladder decompressed. Stomach/Bowel: Stomach is nondistended. No gastric wall thickening. No evidence of outlet obstruction. Duodenum is normally positioned as is the ligament of Treitz. No small bowel wall thickening. No small bowel dilatation. The terminal ileum is normal. The appendix is normal. No gross colonic mass. No colonic wall thickening. No substantial diverticular change. Vascular/Lymphatic: There is abdominal aortic atherosclerosis without aneurysm. There is no gastrohepatic or hepatoduodenal ligament lymphadenopathy. No intraperitoneal or retroperitoneal lymphadenopathy. No pelvic sidewall lymphadenopathy.  Reproductive: The prostate gland and seminal vesicles have normal imaging features. Other: Large volume ascites identified. Umbilical hernia contains fat and fluid. Left groin hernia contains fluid. Musculoskeletal: Similar appearance of 15 mm lucent lesion left acetabulum. Degenerative changes are noted in the lumbar spine. IMPRESSION: 1. Slight decrease in size of subcarinal lymphadenopathy. There is bilateral hilar lymphadenopathy, better demonstrated on today's postcontrast CT than previous multiple prior noncontrast PET CTs. 2. Multiple stable bilateral pulmonary nodules including 2.5 cm left lower lobe lesion. 3. Status post left nephrectomy. 4. Morphologic changes in the liver suggest cirrhosis. Splenomegaly and recanalization of the paraumbilical vein suggest portal venous hypertension 5. Large volume ascites, progressed in the interval 6. Cholelithiasis. 7. Umbilical and left groin hernias containing fluid. 8.  Aortic Atherosclerois (ICD10-170.0) Electronically Signed   By: Misty Stanley M.D.   On: 02/18/2017 13:41   Ct Abdomen Pelvis W Contrast  Result Date: 02/18/2017 CLINICAL DATA:  Metastatic renal  cell carcinoma. EXAM: CT CHEST, ABDOMEN, AND PELVIS WITH CONTRAST TECHNIQUE: Multidetector CT imaging of the chest, abdomen and pelvis was performed following the standard protocol during bolus administration of intravenous contrast. CONTRAST:  141mL ISOVUE-300 IOPAMIDOL (ISOVUE-300) INJECTION 61% COMPARISON:  PET-CT 10/15/2016. FINDINGS: CT CHEST FINDINGS Cardiovascular: The heart size is normal. No pericardial effusion. Coronary artery calcification is evident. Atherosclerotic calcification is noted in the wall of the thoracic aorta. Mediastinum/Nodes: 19 mm short axis subcarinal lymph node decreased from 24 mm on prior study. 19 mm short axis left hilar lymph node better demonstrated on today's study given intravenous contrast material. 13 mm short axis right hilar lymph node evident. The esophagus has normal imaging features. There is no axillary lymphadenopathy. Lungs/Pleura: Multiple bilateral pulmonary nodules again noted. A lobular right upper lobe nodule measured previously at 19 mm is stable, measuring 19 mm today. Another prominent nodule identified in the left lower lobe measures 2.5 cm on today's study (image 85 series 6) compared to 2.4 cm previously. Other scattered bilateral smaller pulmonary nodules are similar to prior. No definite new or progressive pulmonary nodule or mass. Musculoskeletal: Bone windows reveal no worrisome lytic or sclerotic osseous lesions. CT ABDOMEN PELVIS FINDINGS Hepatobiliary: As before, liver contour is mildly irregular suggesting cirrhosis. No hypervascular lesion identified within the liver parenchyma contracted gallbladder contains multiple tiny calcified stones. No intrahepatic or extrahepatic biliary dilation. Pancreas: No focal mass lesion. No dilatation of the main duct. No intraparenchymal cyst. No peripancreatic edema. Spleen: Spleen measures 18.4 cm craniocaudal length. No focal abnormality within the splenic parenchyma. Adrenals/Urinary Tract: Right adrenal  gland normal. Left adrenal gland surgically absent. Left kidney surgically absent. Right kidney unremarkable. Right ureter is nondilated. Bladder decompressed. Stomach/Bowel: Stomach is nondistended. No gastric wall thickening. No evidence of outlet obstruction. Duodenum is normally positioned as is the ligament of Treitz. No small bowel wall thickening. No small bowel dilatation. The terminal ileum is normal. The appendix is normal. No gross colonic mass. No colonic wall thickening. No substantial diverticular change. Vascular/Lymphatic: There is abdominal aortic atherosclerosis without aneurysm. There is no gastrohepatic or hepatoduodenal ligament lymphadenopathy. No intraperitoneal or retroperitoneal lymphadenopathy. No pelvic sidewall lymphadenopathy. Reproductive: The prostate gland and seminal vesicles have normal imaging features. Other: Large volume ascites identified. Umbilical hernia contains fat and fluid. Left groin hernia contains fluid. Musculoskeletal: Similar appearance of 15 mm lucent lesion left acetabulum. Degenerative changes are noted in the lumbar spine. IMPRESSION: 1. Slight decrease in size of subcarinal lymphadenopathy. There is bilateral hilar lymphadenopathy, better demonstrated on today's postcontrast CT  than previous multiple prior noncontrast PET CTs. 2. Multiple stable bilateral pulmonary nodules including 2.5 cm left lower lobe lesion. 3. Status post left nephrectomy. 4. Morphologic changes in the liver suggest cirrhosis. Splenomegaly and recanalization of the paraumbilical vein suggest portal venous hypertension 5. Large volume ascites, progressed in the interval 6. Cholelithiasis. 7. Umbilical and left groin hernias containing fluid. 8.  Aortic Atherosclerois (ICD10-170.0) Electronically Signed   By: Misty Stanley M.D.   On: 02/18/2017 13:41   US Paracentesis  Result Date: 02/14/2017 INDICATION: Cirrhosis, ascites, abdominal distension EXAM: ULTRASOUND GUIDED PARACENTESIS  MEDICATIONS: None. COMPLICATIONS: None immediate. PROCEDURE: An ultrasound guided paracentesis was thoroughly discussed with the patient and questions answered. The benefits, risks, alternatives and complications were also discussed. The patient understands and wishes to proceed with the procedure. Written consent was obtained. Ultrasound was performed to localize and mark an adequate pocket of fluid in the right lower quadrant of the abdomen. The area was then prepped and draped in the normal sterile fashion. 1% Lidocaine was used for local anesthesia. Under ultrasound guidance a 19 gauge Yueh catheter was introduced. Paracentesis was performed. The catheter was removed and a dressing applied. FINDINGS: A total of approximately 10.2 L of clear peritoneal fluid was removed. A fluid sample was not sent for laboratory analysis. IMPRESSION: Successful ultrasound guided paracentesis yielding 10.2 L of ascites. Electronically Signed   By: Jerilynn Mages.  Shick M.D.   On: 02/14/2017 11:40    Assessment and Plan:   BENZ VANDENBERGHE is a 62 y.o. y/o male has been referred for chronic liver disease likely secondary to alcohol. Ascites +, still drinking alcohol. Metastatic RCC. No encephelopathy     Plan  1. Check INR,CMP in 5-7 days after starting lasix 20 mg and aldactone 50 mg  2. Diagnostic paracentesis 3. Low salt diet  4. Check Hep B/A/C serology, HIV 5. Daily weights  6. Check GGT,PTH,Calcium and vitamin D to evaluate elevated alkaline phosphatase and fractionated alkaline phosphatase.  7. RUQ USG in 6 months to screen for [place] 8. At next visit will discuss EGD to screen for esophageal varices once his ascites is better controlled.  9. Stop losartan as we are starting on aldactone and lasix 10. Suggest referral to palliative care to discuss advanced care directives due to two chronic conditions with potentional for decompensation.  11. Start lasix 20 mg once a day and aldactone 50 mg once a day , repeat BMP in 1  week.   Follow up in 2-3 weeks   Dr Jonathon Bellows MD,MRCP(U.K)

## 2017-02-20 ENCOUNTER — Inpatient Hospital Stay (HOSPITAL_BASED_OUTPATIENT_CLINIC_OR_DEPARTMENT_OTHER): Payer: Medicare HMO | Admitting: Oncology

## 2017-02-20 ENCOUNTER — Inpatient Hospital Stay: Payer: Medicare HMO

## 2017-02-20 ENCOUNTER — Encounter: Payer: Self-pay | Admitting: Oncology

## 2017-02-20 VITALS — BP 123/80 | HR 65 | Temp 97.3°F | Resp 18 | Wt 183.0 lb

## 2017-02-20 DIAGNOSIS — D61818 Other pancytopenia: Secondary | ICD-10-CM

## 2017-02-20 DIAGNOSIS — Z79899 Other long term (current) drug therapy: Secondary | ICD-10-CM | POA: Diagnosis not present

## 2017-02-20 DIAGNOSIS — C7802 Secondary malignant neoplasm of left lung: Principal | ICD-10-CM

## 2017-02-20 DIAGNOSIS — Z5111 Encounter for antineoplastic chemotherapy: Secondary | ICD-10-CM | POA: Diagnosis not present

## 2017-02-20 DIAGNOSIS — K7460 Unspecified cirrhosis of liver: Secondary | ICD-10-CM | POA: Diagnosis not present

## 2017-02-20 DIAGNOSIS — D509 Iron deficiency anemia, unspecified: Secondary | ICD-10-CM

## 2017-02-20 DIAGNOSIS — N183 Chronic kidney disease, stage 3 (moderate): Secondary | ICD-10-CM

## 2017-02-20 DIAGNOSIS — Z87891 Personal history of nicotine dependence: Secondary | ICD-10-CM

## 2017-02-20 DIAGNOSIS — C649 Malignant neoplasm of unspecified kidney, except renal pelvis: Secondary | ICD-10-CM

## 2017-02-20 DIAGNOSIS — I129 Hypertensive chronic kidney disease with stage 1 through stage 4 chronic kidney disease, or unspecified chronic kidney disease: Secondary | ICD-10-CM | POA: Diagnosis not present

## 2017-02-20 DIAGNOSIS — C7800 Secondary malignant neoplasm of unspecified lung: Secondary | ICD-10-CM

## 2017-02-20 DIAGNOSIS — Z905 Acquired absence of kidney: Secondary | ICD-10-CM

## 2017-02-20 DIAGNOSIS — C642 Malignant neoplasm of left kidney, except renal pelvis: Secondary | ICD-10-CM | POA: Diagnosis not present

## 2017-02-20 DIAGNOSIS — R188 Other ascites: Secondary | ICD-10-CM

## 2017-02-20 LAB — PTH, INTACT AND CALCIUM
Calcium, Total (PTH): 8.2 mg/dL — ABNORMAL LOW (ref 8.6–10.2)
PTH: 43 pg/mL (ref 15–65)

## 2017-02-20 LAB — HEPATITIS B CORE ANTIBODY, TOTAL: HEP B C TOTAL AB: NEGATIVE

## 2017-02-20 LAB — CBC WITH DIFFERENTIAL/PLATELET
BASOS ABS: 0 10*3/uL (ref 0–0.1)
Basophils Relative: 1 %
EOS ABS: 0.3 10*3/uL (ref 0–0.7)
Eosinophils Relative: 11 %
HEMATOCRIT: 33.7 % — AB (ref 40.0–52.0)
HEMOGLOBIN: 10.8 g/dL — AB (ref 13.0–18.0)
Lymphocytes Relative: 29 %
Lymphs Abs: 0.8 10*3/uL — ABNORMAL LOW (ref 1.0–3.6)
MCH: 26.1 pg (ref 26.0–34.0)
MCHC: 31.9 g/dL — AB (ref 32.0–36.0)
MCV: 81.6 fL (ref 80.0–100.0)
MONOS PCT: 12 %
Monocytes Absolute: 0.3 10*3/uL (ref 0.2–1.0)
NEUTROS ABS: 1.3 10*3/uL — AB (ref 1.4–6.5)
NEUTROS PCT: 47 %
Platelets: 122 10*3/uL — ABNORMAL LOW (ref 150–440)
RBC: 4.13 MIL/uL — ABNORMAL LOW (ref 4.40–5.90)
RDW: 23.2 % — AB (ref 11.5–14.5)
WBC: 2.7 10*3/uL — ABNORMAL LOW (ref 3.8–10.6)

## 2017-02-20 LAB — HEPATITIS B E ANTIGEN: HEP B E AG: POSITIVE — AB

## 2017-02-20 LAB — COMPREHENSIVE METABOLIC PANEL
ALBUMIN: 2.9 g/dL — AB (ref 3.5–5.0)
ALK PHOS: 293 U/L — AB (ref 38–126)
ALT: 16 U/L — ABNORMAL LOW (ref 17–63)
ANION GAP: 3 — AB (ref 5–15)
AST: 39 U/L (ref 15–41)
BILIRUBIN TOTAL: 1 mg/dL (ref 0.3–1.2)
BUN: 17 mg/dL (ref 6–20)
CO2: 28 mmol/L (ref 22–32)
Calcium: 8.5 mg/dL — ABNORMAL LOW (ref 8.9–10.3)
Chloride: 104 mmol/L (ref 101–111)
Creatinine, Ser: 1.37 mg/dL — ABNORMAL HIGH (ref 0.61–1.24)
GFR calc Af Amer: >60 mL/min (ref >60)
GFR calc non Af Amer: 55 mL/min — ABNORMAL LOW (ref >60)
Glucose, Bld: 53 mg/dL — ABNORMAL LOW (ref 65–99)
POTASSIUM: 4.9 mmol/L (ref 3.5–5.1)
SODIUM: 135 mmol/L (ref 135–145)
Total Protein: 6.9 g/dL (ref 6.5–8.1)

## 2017-02-20 LAB — CERULOPLASMIN: CERULOPLASMIN: 26.3 mg/dL (ref 16.0–31.0)

## 2017-02-20 LAB — HEPATITIS B SURFACE ANTIBODY, QUANTITATIVE: Hepatitis B-Post: <3.1 m[IU]/mL — ABNORMAL LOW (ref >9.9)

## 2017-02-20 LAB — ALPHA-1 ANTITRYPSIN PHENOTYPE: A1 ANTITRYPSIN SER: 179 mg/dL (ref 90–200)

## 2017-02-20 LAB — MITOCHONDRIAL ANTIBODIES: Mitochondrial M2 Ab, IgG: <20 Units (ref 0.0–20.0)

## 2017-02-20 LAB — HEPATITIS C ANTIBODY

## 2017-02-20 LAB — ANTI-SMOOTH MUSCLE ANTIBODY, IGG: F-Actin IgG: 16 Units (ref 0–19)

## 2017-02-20 LAB — HEPATITIS A ANTIBODY, TOTAL: Hep A Total Ab: POSITIVE — AB

## 2017-02-20 MED ORDER — FLUCONAZOLE 150 MG PO TABS: 150.0000 mg | ORAL_TABLET | Freq: Every day | ORAL | 0 refills | Status: DC

## 2017-02-20 MED ORDER — SODIUM CHLORIDE 0.9 % IV SOLN
240.0000 mg | Freq: Once | INTRAVENOUS | Status: AC
  Administered 2017-02-20: 240 mg via INTRAVENOUS
  Filled 2017-02-20: qty 24

## 2017-02-20 MED ORDER — LIDOCAINE VISCOUS 2 % MT SOLN: 10.0000 mL | OROMUCOSAL | 0 refills | Status: DC | PRN

## 2017-02-20 MED ORDER — SODIUM CHLORIDE 0.9 % IV SOLN
Freq: Once | INTRAVENOUS | Status: AC
  Administered 2017-02-20: 11:00:00 via INTRAVENOUS
  Filled 2017-02-20: qty 1000

## 2017-02-21 DIAGNOSIS — R69 Illness, unspecified: Secondary | ICD-10-CM | POA: Diagnosis not present

## 2017-02-25 ENCOUNTER — Other Ambulatory Visit: Payer: Self-pay

## 2017-02-25 DIAGNOSIS — K7031 Alcoholic cirrhosis of liver with ascites: Secondary | ICD-10-CM

## 2017-02-25 MED ORDER — ALBUMIN HUMAN 25 % IV SOLN: 12.5000 g | Freq: Once | INTRAVENOUS | Status: DC

## 2017-02-26 ENCOUNTER — Telehealth: Payer: Self-pay

## 2017-02-26 ENCOUNTER — Other Ambulatory Visit: Payer: Self-pay

## 2017-02-26 DIAGNOSIS — K7031 Alcoholic cirrhosis of liver with ascites: Secondary | ICD-10-CM

## 2017-02-26 NOTE — Telephone Encounter (Signed)
Advised patient that paracentesis was scheduled for 3/1 @ 730am. Patient states he had not received a phone call yet but may have missed the call.  Advised patient of lab results per Dr. Vicente Males.  - inform he is positive for Hepatitis B e antigen which means he has been infected with hepatitis B, I need to check his hepatitis B viral load, hepatitis Delta antigen and antibody . Discuss further at follow up.

## 2017-03-01 ENCOUNTER — Ambulatory Visit
Admission: RE | Admit: 2017-03-01 | Discharge: 2017-03-01 | Disposition: A | Discharge status: Home / Self Care | Payer: Medicare HMO | Source: Ambulatory Visit | Attending: Gastroenterology | Admitting: Gastroenterology

## 2017-03-01 DIAGNOSIS — C649 Malignant neoplasm of unspecified kidney, except renal pelvis: Secondary | ICD-10-CM

## 2017-03-01 DIAGNOSIS — C7802 Secondary malignant neoplasm of left lung: Secondary | ICD-10-CM

## 2017-03-01 DIAGNOSIS — K7031 Alcoholic cirrhosis of liver with ascites: Secondary | ICD-10-CM

## 2017-03-01 DIAGNOSIS — R188 Other ascites: Secondary | ICD-10-CM | POA: Insufficient documentation

## 2017-03-01 LAB — CBC WITH DIFFERENTIAL/PLATELET
Basophils Absolute: 0 10*3/uL (ref 0–0.1)
Basophils Relative: 1 %
Eosinophils Absolute: 0.1 10*3/uL (ref 0–0.7)
Eosinophils Relative: 8 %
HEMATOCRIT: 29.7 % — AB (ref 40.0–52.0)
HEMOGLOBIN: 9.6 g/dL — AB (ref 13.0–18.0)
LYMPHS PCT: 23 %
Lymphs Abs: 0.4 10*3/uL — ABNORMAL LOW (ref 1.0–3.6)
MCH: 26.4 pg (ref 26.0–34.0)
MCHC: 32.2 g/dL (ref 32.0–36.0)
MCV: 82 fL (ref 80.0–100.0)
MONOS PCT: 14 %
Monocytes Absolute: 0.2 10*3/uL (ref 0.2–1.0)
NEUTROS PCT: 54 %
Neutro Abs: 1 10*3/uL — ABNORMAL LOW (ref 1.4–6.5)
Platelets: 133 10*3/uL — ABNORMAL LOW (ref 150–440)
RBC: 3.62 MIL/uL — AB (ref 4.40–5.90)
RDW: 21.3 % — ABNORMAL HIGH (ref 11.5–14.5)
WBC: 1.8 10*3/uL — AB (ref 3.8–10.6)

## 2017-03-01 LAB — ALBUMIN, PLEURAL OR PERITONEAL FLUID

## 2017-03-01 LAB — LACTATE DEHYDROGENASE, PLEURAL OR PERITONEAL FLUID: LD, Fluid: 28 U/L — ABNORMAL HIGH (ref 3–23)

## 2017-03-01 LAB — COMPREHENSIVE METABOLIC PANEL
ALT: 16 U/L — AB (ref 17–63)
ANION GAP: 7 (ref 5–15)
AST: 31 U/L (ref 15–41)
Albumin: 3.5 g/dL (ref 3.5–5.0)
Alkaline Phosphatase: 292 U/L — ABNORMAL HIGH (ref 38–126)
BUN: 18 mg/dL (ref 6–20)
CHLORIDE: 98 mmol/L — AB (ref 101–111)
CO2: 28 mmol/L (ref 22–32)
CREATININE: 1.33 mg/dL — AB (ref 0.61–1.24)
Calcium: 8.6 mg/dL — ABNORMAL LOW (ref 8.9–10.3)
GFR calc non Af Amer: 57 mL/min — ABNORMAL LOW (ref >60)
Glucose, Bld: 80 mg/dL (ref 65–99)
POTASSIUM: 4 mmol/L (ref 3.5–5.1)
SODIUM: 133 mmol/L — AB (ref 135–145)
Total Bilirubin: 1.1 mg/dL (ref 0.3–1.2)
Total Protein: 7.4 g/dL (ref 6.5–8.1)

## 2017-03-01 LAB — BODY FLUID CELL COUNT WITH DIFFERENTIAL
EOS FL: 0 %
LYMPHS FL: 53 %
MONOCYTE-MACROPHAGE-SEROUS FLUID: 46 %
Neutrophil Count, Fluid: 1 %
Other Cells, Fluid: 0 %
WBC FLUID: 151 uL

## 2017-03-01 LAB — PROTEIN, PLEURAL OR PERITONEAL FLUID

## 2017-03-01 LAB — PATHOLOGIST SMEAR REVIEW

## 2017-03-01 MED ORDER — ALBUMIN HUMAN 25 % IV SOLN
25.0000 g | Freq: Once | INTRAVENOUS | Status: AC
  Administered 2017-03-01: 25 g via INTRAVENOUS
  Filled 2017-03-01: qty 100

## 2017-03-01 MED ORDER — ALBUMIN HUMAN 25 % IV SOLN
INTRAVENOUS | Status: AC
  Administered 2017-03-01: 25 g via INTRAVENOUS
  Filled 2017-03-01: qty 100

## 2017-03-01 NOTE — Procedures (Signed)
Ultrasound-guided diagnostic and therapeutic paracentesis performed yielding 9 liters of yellow fluid. No immediate complications. A portion of the fluid was sent the lab for preordered studies. The pt  will receive IV albumin postprocedure.

## 2017-03-02 LAB — PROTEIN, BODY FLUID (OTHER): TOTAL PROTEIN, BODY FLUID OTHER: 1.4 g/dL

## 2017-03-04 ENCOUNTER — Ambulatory Visit: Payer: Medicare HMO | Admitting: Gastroenterology

## 2017-03-04 ENCOUNTER — Encounter: Payer: Self-pay | Admitting: Gastroenterology

## 2017-03-04 ENCOUNTER — Other Ambulatory Visit: Payer: Self-pay

## 2017-03-04 VITALS — BP 113/74 | HR 69 | Temp 98.3°F | Ht 71.0 in | Wt 160.6 lb

## 2017-03-04 DIAGNOSIS — R69 Illness, unspecified: Secondary | ICD-10-CM | POA: Diagnosis not present

## 2017-03-04 DIAGNOSIS — K7460 Unspecified cirrhosis of liver: Secondary | ICD-10-CM

## 2017-03-04 DIAGNOSIS — K7031 Alcoholic cirrhosis of liver with ascites: Secondary | ICD-10-CM

## 2017-03-04 DIAGNOSIS — R188 Other ascites: Principal | ICD-10-CM

## 2017-03-04 LAB — BODY FLUID CULTURE: Culture: NO GROWTH

## 2017-03-04 MED ORDER — SPIRONOLACTONE 100 MG PO TABS: 100.0000 mg | ORAL_TABLET | Freq: Every day | ORAL | 2 refills | Status: DC

## 2017-03-04 MED ORDER — FUROSEMIDE 40 MG PO TABS: 40.0000 mg | ORAL_TABLET | Freq: Every day | ORAL | 0 refills | Status: DC

## 2017-03-04 NOTE — Addendum Note (Signed)
Addended by: Peggye Ley on: 03/04/2017 03:38 PM   Modules accepted: Orders, SmartSet

## 2017-03-04 NOTE — Progress Notes (Signed)
Jonathon Bellows MD, MRCP(U.K) [address]  [place]  [place], [place] [address]  Main: [phone]  Fax: [phone]   Primary Care Physician: System, Pcp Not In  Primary Gastroenterologist:  Dr. Jonathon Bellows   No chief complaint on file.   HPI: Rodney Stewart is a 62 y.o. male    Summary of history :  He is here to follow up for  chronic liver disease and cirrhosis likely from alcohol and actively still drinking . Cirrhosis diagnosed in early 2019  . He has a history of stage 4 RCC , b/l lung metastasis . CKD 3 and Diabetes Mellitus , on chemotherapy. He has pancytopenia and follows with Dr Grayland Ormond.   CT scan of the abdomen done on 02/18/2017 showed subcarinal lymphadenopathy.  Multiple stable bilateral pulmonary nodules.  Status post left nephrectomy.  Liver features suggestive of cirrhosis splenomegaly recanalization of the paraumbilical vein.  Large volume ascites.  Cholelithiasis.  He underwent ultrasound paracentesis for 10.2 L on 02/14/2017.  Labs on 02/08/2017 demonstrated hemoglobin of 10.5 with an MCV of 79.1 and a white cell count of 2.2. Cr 1.41 , elevated alkaline phosphatase and Tbilirubin 1.7 .    He has consumed 6-7 beers a day x many years atleast 30 years.2 beers in the last two weeks   Interval history   02/20/2016-  03/04/2016   Weighs 22 pounds lighter than last visit - recent paracentesis .Has cute down salt in his food, cut down on alcohol but still drinks.   Labs 02/19/17 : Immune to hep A. HCv ab -negative, Hep B e antigen positive , hep b c ab,HIV,AMA,Factin,ceruloplasmin   -negtive. PTH normal. A1AT -normal , TSH elevated. Cr 1.37 , GFR 55, alk phos 293, ALT 16 , AST 39 , Ascites - no SBP,SAAG-suggetsive of portal HTN.   BP 113/74 (BP Location: Left Arm, Patient Position: Sitting, Cuff Size: Normal)   Pulse 69   Temp 98.3 F (36.8 C) (Oral)   Ht 5' 11"  (1.803 m)   Wt 160 lb 9.6 oz (72.8 kg)   BMI 22.40 kg/m    Current Outpatient Medications    Medication Sig Dispense Refill  . acetaminophen (TYLENOL) 325 MG tablet Take 325 mg by mouth every 6 (six) hours as needed.    Marland Kitchen albuterol (PROVENTIL HFA;VENTOLIN HFA) 108 (90 Base) MCG/ACT inhaler Inhale 2 puffs into the lungs every 6 (six) hours as needed for wheezing or shortness of breath. (Patient not taking: Reported on 02/19/2017) 1 Inhaler 0  . ALPRAZolam (XANAX) 1 MG tablet Take 1 mg by mouth as needed for sleep.    Marland Kitchen amLODipine (NORVASC) 10 MG tablet Take 10 mg by mouth daily.     Marland Kitchen aspirin 81 MG chewable tablet Chew 81 mg by mouth daily.    . colchicine 0.6 MG tablet Take 0.6 mg by mouth every other day.     . fluconazole (DIFLUCAN) 150 MG tablet Take 1 tablet (150 mg total) by mouth daily. 3 tablet 0  . furosemide (LASIX) 20 MG tablet Take 1 tablet (20 mg total) by mouth daily. 59 tablet 0  . gabapentin (NEURONTIN) 100 MG capsule Take 200 mg by mouth 3 (three) times daily.     . insulin aspart (NOVOLOG) 100 UNIT/ML FlexPen Inject 15 Units into the skin daily.     . Insulin Detemir (LEVEMIR FLEXTOUCH) 100 UNIT/ML Pen Inject 35 Units into the skin daily at 10 pm. 15 mL 11  . lidocaine (XYLOCAINE) 2 %  solution Use as directed 10 mLs in the mouth or throat as needed for mouth pain. 100 mL 0  . lovastatin (MEVACOR) 20 MG tablet Take 20 mg by mouth daily.     Marland Kitchen lovastatin (MEVACOR) 40 MG tablet     . Nebivolol HCl 20 MG TABS Take 20 mg by mouth daily.    . ONE TOUCH ULTRA TEST test strip     . polyethylene glycol (MIRALAX / GLYCOLAX) packet Take 17 g by mouth daily as needed for mild constipation. (Patient not taking: Reported on 02/19/2017) 14 each 0  . Pseudoeph-Doxylamine-DM-APAP (NYQUIL PO) Take by mouth as needed.    Marland Kitchen spironolactone (ALDACTONE) 50 MG tablet Take 1 tablet (50 mg total) by mouth daily. (Patient not taking: Reported on 02/20/2017) 30 tablet 1  . tamsulosin (FLOMAX) 0.4 MG CAPS capsule Take 0.4 mg by mouth every other day.      Current Facility-Administered Medications   Medication Dose Route Frequency Provider Last Rate Last Dose  . albumin human 25 % solution 12.5 g  12.5 g Intravenous Once Jonathon Bellows, MD      . albumin human 5 % solution 25 g  25 g Intravenous Once Jonathon Bellows, MD        Allergies as of 03/04/2017  . (No Known Allergies)    ROS:  General: Negative for anorexia, weight loss, fever, chills, fatigue, weakness. ENT: Negative for hoarseness, difficulty swallowing , nasal congestion. CV: Negative for chest pain, angina, palpitations, dyspnea on exertion, peripheral edema.  Respiratory: Negative for dyspnea at rest, dyspnea on exertion, cough, sputum, wheezing.  GI: See history of present illness. GU:  Negative for dysuria, hematuria, urinary incontinence, urinary frequency, nocturnal urination.  Endo: Negative for unusual weight change.    Physical Examination:   There were no vitals taken for this visit.  General: Well-nourished, well-developed in no acute distress.  Eyes: No icterus. Conjunctivae pink. Mouth: Oropharyngeal mucosa moist and pink , no lesions erythema or exudate. Lungs: Clear to auscultation bilaterally. Non-labored. Heart: Regular rate and rhythm, no murmurs rubs or gallops.  Abdomen: Bowel sounds are normal, nontender, distended no hepatosplenomegaly or masses, no abdominal bruits or hernia , no rebound or guarding.   Extremities: No lower extremity edema. No clubbing or deformities. Neuro: Alert and oriented x 3.  Grossly intact. Skin: Warm and dry, no jaundice.   Psych: Alert and cooperative, normal mood and affect.   Imaging Studies: Ct Chest W Contrast  Result Date: 02/18/2017 CLINICAL DATA:  Metastatic renal cell carcinoma. EXAM: CT CHEST, ABDOMEN, AND PELVIS WITH CONTRAST TECHNIQUE: Multidetector CT imaging of the chest, abdomen and pelvis was performed following the standard protocol during bolus administration of intravenous contrast. CONTRAST:  157m ISOVUE-300 IOPAMIDOL (ISOVUE-300) INJECTION 61%  COMPARISON:  PET-CT 10/15/2016. FINDINGS: CT CHEST FINDINGS Cardiovascular: The heart size is normal. No pericardial effusion. Coronary artery calcification is evident. Atherosclerotic calcification is noted in the wall of the thoracic aorta. Mediastinum/Nodes: 19 mm short axis subcarinal lymph node decreased from 24 mm on prior study. 19 mm short axis left hilar lymph node better demonstrated on today's study given intravenous contrast material. 13 mm short axis right hilar lymph node evident. The esophagus has normal imaging features. There is no axillary lymphadenopathy. Lungs/Pleura: Multiple bilateral pulmonary nodules again noted. A lobular right upper lobe nodule measured previously at 19 mm is stable, measuring 19 mm today. Another prominent nodule identified in the left lower lobe measures 2.5 cm on today's study (image 85 series  6) compared to 2.4 cm previously. Other scattered bilateral smaller pulmonary nodules are similar to prior. No definite new or progressive pulmonary nodule or mass. Musculoskeletal: Bone windows reveal no worrisome lytic or sclerotic osseous lesions. CT ABDOMEN PELVIS FINDINGS Hepatobiliary: As before, liver contour is mildly irregular suggesting cirrhosis. No hypervascular lesion identified within the liver parenchyma contracted gallbladder contains multiple tiny calcified stones. No intrahepatic or extrahepatic biliary dilation. Pancreas: No focal mass lesion. No dilatation of the main duct. No intraparenchymal cyst. No peripancreatic edema. Spleen: Spleen measures 18.4 cm craniocaudal length. No focal abnormality within the splenic parenchyma. Adrenals/Urinary Tract: Right adrenal gland normal. Left adrenal gland surgically absent. Left kidney surgically absent. Right kidney unremarkable. Right ureter is nondilated. Bladder decompressed. Stomach/Bowel: Stomach is nondistended. No gastric wall thickening. No evidence of outlet obstruction. Duodenum is normally positioned as is  the ligament of Treitz. No small bowel wall thickening. No small bowel dilatation. The terminal ileum is normal. The appendix is normal. No gross colonic mass. No colonic wall thickening. No substantial diverticular change. Vascular/Lymphatic: There is abdominal aortic atherosclerosis without aneurysm. There is no gastrohepatic or hepatoduodenal ligament lymphadenopathy. No intraperitoneal or retroperitoneal lymphadenopathy. No pelvic sidewall lymphadenopathy. Reproductive: The prostate gland and seminal vesicles have normal imaging features. Other: Large volume ascites identified. Umbilical hernia contains fat and fluid. Left groin hernia contains fluid. Musculoskeletal: Similar appearance of 15 mm lucent lesion left acetabulum. Degenerative changes are noted in the lumbar spine. IMPRESSION: 1. Slight decrease in size of subcarinal lymphadenopathy. There is bilateral hilar lymphadenopathy, better demonstrated on today's postcontrast CT than previous multiple prior noncontrast PET CTs. 2. Multiple stable bilateral pulmonary nodules including 2.5 cm left lower lobe lesion. 3. Status post left nephrectomy. 4. Morphologic changes in the liver suggest cirrhosis. Splenomegaly and recanalization of the paraumbilical vein suggest portal venous hypertension 5. Large volume ascites, progressed in the interval 6. Cholelithiasis. 7. Umbilical and left groin hernias containing fluid. 8.  Aortic Atherosclerois (ICD10-170.0) Electronically Signed   By: Misty Stanley M.D.   On: 02/18/2017 13:41   Ct Abdomen Pelvis W Contrast  Result Date: 02/18/2017 CLINICAL DATA:  Metastatic renal cell carcinoma. EXAM: CT CHEST, ABDOMEN, AND PELVIS WITH CONTRAST TECHNIQUE: Multidetector CT imaging of the chest, abdomen and pelvis was performed following the standard protocol during bolus administration of intravenous contrast. CONTRAST:  150m ISOVUE-300 IOPAMIDOL (ISOVUE-300) INJECTION 61% COMPARISON:  PET-CT 10/15/2016. FINDINGS: CT CHEST  FINDINGS Cardiovascular: The heart size is normal. No pericardial effusion. Coronary artery calcification is evident. Atherosclerotic calcification is noted in the wall of the thoracic aorta. Mediastinum/Nodes: 19 mm short axis subcarinal lymph node decreased from 24 mm on prior study. 19 mm short axis left hilar lymph node better demonstrated on today's study given intravenous contrast material. 13 mm short axis right hilar lymph node evident. The esophagus has normal imaging features. There is no axillary lymphadenopathy. Lungs/Pleura: Multiple bilateral pulmonary nodules again noted. A lobular right upper lobe nodule measured previously at 19 mm is stable, measuring 19 mm today. Another prominent nodule identified in the left lower lobe measures 2.5 cm on today's study (image 85 series 6) compared to 2.4 cm previously. Other scattered bilateral smaller pulmonary nodules are similar to prior. No definite new or progressive pulmonary nodule or mass. Musculoskeletal: Bone windows reveal no worrisome lytic or sclerotic osseous lesions. CT ABDOMEN PELVIS FINDINGS Hepatobiliary: As before, liver contour is mildly irregular suggesting cirrhosis. No hypervascular lesion identified within the liver parenchyma contracted gallbladder contains multiple tiny calcified stones.  No intrahepatic or extrahepatic biliary dilation. Pancreas: No focal mass lesion. No dilatation of the main duct. No intraparenchymal cyst. No peripancreatic edema. Spleen: Spleen measures 18.4 cm craniocaudal length. No focal abnormality within the splenic parenchyma. Adrenals/Urinary Tract: Right adrenal gland normal. Left adrenal gland surgically absent. Left kidney surgically absent. Right kidney unremarkable. Right ureter is nondilated. Bladder decompressed. Stomach/Bowel: Stomach is nondistended. No gastric wall thickening. No evidence of outlet obstruction. Duodenum is normally positioned as is the ligament of Treitz. No small bowel wall  thickening. No small bowel dilatation. The terminal ileum is normal. The appendix is normal. No gross colonic mass. No colonic wall thickening. No substantial diverticular change. Vascular/Lymphatic: There is abdominal aortic atherosclerosis without aneurysm. There is no gastrohepatic or hepatoduodenal ligament lymphadenopathy. No intraperitoneal or retroperitoneal lymphadenopathy. No pelvic sidewall lymphadenopathy. Reproductive: The prostate gland and seminal vesicles have normal imaging features. Other: Large volume ascites identified. Umbilical hernia contains fat and fluid. Left groin hernia contains fluid. Musculoskeletal: Similar appearance of 15 mm lucent lesion left acetabulum. Degenerative changes are noted in the lumbar spine. IMPRESSION: 1. Slight decrease in size of subcarinal lymphadenopathy. There is bilateral hilar lymphadenopathy, better demonstrated on today's postcontrast CT than previous multiple prior noncontrast PET CTs. 2. Multiple stable bilateral pulmonary nodules including 2.5 cm left lower lobe lesion. 3. Status post left nephrectomy. 4. Morphologic changes in the liver suggest cirrhosis. Splenomegaly and recanalization of the paraumbilical vein suggest portal venous hypertension 5. Large volume ascites, progressed in the interval 6. Cholelithiasis. 7. Umbilical and left groin hernias containing fluid. 8.  Aortic Atherosclerois (ICD10-170.0) Electronically Signed   By: Misty Stanley M.D.   On: 02/18/2017 13:41   US Paracentesis  Result Date: 03/01/2017 INDICATION: Metastatic renal cell carcinoma, renal insufficiency, cirrhosis, recurrent ascites. Request made for diagnostic and therapeutic paracentesis. EXAM: ULTRASOUND GUIDED DIAGNOSTIC AND THERAPEUTIC PARACENTESIS MEDICATIONS: None. COMPLICATIONS: None immediate. PROCEDURE: Informed written consent was obtained from the patient after a discussion of the risks, benefits and alternatives to treatment. A timeout was performed prior to  the initiation of the procedure. Initial ultrasound scanning demonstrates a large amount of ascites within the right lower abdominal quadrant. The right lower abdomen was prepped and draped in the usual sterile fashion. 1% lidocaine was used for local anesthesia. Following this, a 6 Fr Safe-T-Centesis catheter was introduced. An ultrasound image was saved for documentation purposes. The paracentesis was performed. The catheter was removed and a dressing was applied. The patient tolerated the procedure well without immediate post procedural complication. FINDINGS: A total of approximately 9 liters of yellow fluid was removed. Samples were sent to the laboratory as requested by the clinical team. IMPRESSION: Successful ultrasound-guided diagnostic and therapeutic paracentesis yielding 9 liters of peritoneal fluid. The patient will receive IV albumin infusion postprocedure. Read by: Rowe Robert, PA-C Electronically Signed   By: Markus Daft M.D.   On: 03/01/2017 09:32   US Paracentesis  Result Date: 02/14/2017 INDICATION: Cirrhosis, ascites, abdominal distension EXAM: ULTRASOUND GUIDED PARACENTESIS MEDICATIONS: None. COMPLICATIONS: None immediate. PROCEDURE: An ultrasound guided paracentesis was thoroughly discussed with the patient and questions answered. The benefits, risks, alternatives and complications were also discussed. The patient understands and wishes to proceed with the procedure. Written consent was obtained. Ultrasound was performed to localize and mark an adequate pocket of fluid in the right lower quadrant of the abdomen. The area was then prepped and draped in the normal sterile fashion. 1% Lidocaine was used for local anesthesia. Under ultrasound guidance a 19 gauge Teressa Lower  catheter was introduced. Paracentesis was performed. The catheter was removed and a dressing applied. FINDINGS: A total of approximately 10.2 L of clear peritoneal fluid was removed. A fluid sample was not sent for laboratory  analysis. IMPRESSION: Successful ultrasound guided paracentesis yielding 10.2 L of ascites. Electronically Signed   By: Jerilynn Mages.  Shick M.D.   On: 02/14/2017 11:40    Assessment and Plan:   EUCLID CASSETTA is a 62 y.o. y/o male here to follow up  for chronic liver disease likely secondary to alcohol. Ascites +, still drinking alcohol. Metastatic RCC. No encephelopathy Hepattis B e antigen positive    Plan  1.increase aldactone to 100 mg and lasix to 40 mg , stop all alcohol and recheck BMP in a week  2.Low salt diet , stop all alcohol  3. Check Hep B viral load, delta antigen ,fractionated alk phos 4. Suggest PCP to address elevated TSH- He says that he is Dr Posey Pronto , he would like a primary care doctor here  5. RUQ USG in 6 months to screen for [place] 6. EGD to screen for esophageal varices once his ascites is better controlled.  7.  Suggest referral to palliative care to discuss advanced care directives due to two chronic conditions with potentional for decompensation.   I have discussed alternative options, risks & benefits,  which include, but are not limited to, bleeding, infection, perforation,respiratory complication & drug reaction.  The patient agrees with this plan & written consent will be obtained.    Dr Jonathon Bellows  MD,MRCP [facility]) Follow up in 4 weeks   .

## 2017-03-04 NOTE — Addendum Note (Signed)
Addended by: Peggye Ley on: 03/04/2017 01:28 PM   Modules accepted: Orders

## 2017-03-06 ENCOUNTER — Inpatient Hospital Stay: Payer: Medicare HMO

## 2017-03-06 ENCOUNTER — Inpatient Hospital Stay: Payer: Medicare HMO | Attending: Oncology

## 2017-03-06 VITALS — BP 107/68 | HR 75 | Temp 98.7°F

## 2017-03-06 DIAGNOSIS — Z79899 Other long term (current) drug therapy: Secondary | ICD-10-CM | POA: Insufficient documentation

## 2017-03-06 DIAGNOSIS — N183 Chronic kidney disease, stage 3 (moderate): Secondary | ICD-10-CM | POA: Insufficient documentation

## 2017-03-06 DIAGNOSIS — K429 Umbilical hernia without obstruction or gangrene: Secondary | ICD-10-CM | POA: Insufficient documentation

## 2017-03-06 DIAGNOSIS — C7800 Secondary malignant neoplasm of unspecified lung: Secondary | ICD-10-CM | POA: Insufficient documentation

## 2017-03-06 DIAGNOSIS — E871 Hypo-osmolality and hyponatremia: Secondary | ICD-10-CM | POA: Diagnosis not present

## 2017-03-06 DIAGNOSIS — M109 Gout, unspecified: Secondary | ICD-10-CM | POA: Insufficient documentation

## 2017-03-06 DIAGNOSIS — Z7982 Long term (current) use of aspirin: Secondary | ICD-10-CM | POA: Insufficient documentation

## 2017-03-06 DIAGNOSIS — Z5111 Encounter for antineoplastic chemotherapy: Secondary | ICD-10-CM | POA: Diagnosis present

## 2017-03-06 DIAGNOSIS — Z794 Long term (current) use of insulin: Secondary | ICD-10-CM | POA: Insufficient documentation

## 2017-03-06 DIAGNOSIS — I129 Hypertensive chronic kidney disease with stage 1 through stage 4 chronic kidney disease, or unspecified chronic kidney disease: Secondary | ICD-10-CM | POA: Diagnosis not present

## 2017-03-06 DIAGNOSIS — I700 Atherosclerosis of aorta: Secondary | ICD-10-CM | POA: Insufficient documentation

## 2017-03-06 DIAGNOSIS — C642 Malignant neoplasm of left kidney, except renal pelvis: Secondary | ICD-10-CM | POA: Diagnosis not present

## 2017-03-06 DIAGNOSIS — N400 Enlarged prostate without lower urinary tract symptoms: Secondary | ICD-10-CM | POA: Insufficient documentation

## 2017-03-06 DIAGNOSIS — K7460 Unspecified cirrhosis of liver: Secondary | ICD-10-CM | POA: Diagnosis not present

## 2017-03-06 DIAGNOSIS — E1122 Type 2 diabetes mellitus with diabetic chronic kidney disease: Secondary | ICD-10-CM | POA: Diagnosis not present

## 2017-03-06 DIAGNOSIS — D61818 Other pancytopenia: Secondary | ICD-10-CM | POA: Diagnosis not present

## 2017-03-06 DIAGNOSIS — Z87891 Personal history of nicotine dependence: Secondary | ICD-10-CM | POA: Diagnosis not present

## 2017-03-06 DIAGNOSIS — C649 Malignant neoplasm of unspecified kidney, except renal pelvis: Secondary | ICD-10-CM

## 2017-03-06 DIAGNOSIS — K219 Gastro-esophageal reflux disease without esophagitis: Secondary | ICD-10-CM | POA: Diagnosis not present

## 2017-03-06 DIAGNOSIS — C7802 Secondary malignant neoplasm of left lung: Principal | ICD-10-CM

## 2017-03-06 DIAGNOSIS — D509 Iron deficiency anemia, unspecified: Secondary | ICD-10-CM | POA: Insufficient documentation

## 2017-03-06 DIAGNOSIS — Z905 Acquired absence of kidney: Secondary | ICD-10-CM | POA: Insufficient documentation

## 2017-03-06 LAB — COMPREHENSIVE METABOLIC PANEL
ALT: 17 U/L (ref 17–63)
ANION GAP: 7 (ref 5–15)
AST: 35 U/L (ref 15–41)
Albumin: 2.8 g/dL — ABNORMAL LOW (ref 3.5–5.0)
Alkaline Phosphatase: 352 U/L — ABNORMAL HIGH (ref 38–126)
BUN: 31 mg/dL — ABNORMAL HIGH (ref 6–20)
CHLORIDE: 97 mmol/L — AB (ref 101–111)
CO2: 27 mmol/L (ref 22–32)
Calcium: 8.5 mg/dL — ABNORMAL LOW (ref 8.9–10.3)
Creatinine, Ser: 1.41 mg/dL — ABNORMAL HIGH (ref 0.61–1.24)
GFR, EST NON AFRICAN AMERICAN: 53 mL/min — AB (ref >60)
Glucose, Bld: 203 mg/dL — ABNORMAL HIGH (ref 65–99)
POTASSIUM: 5.1 mmol/L (ref 3.5–5.1)
SODIUM: 131 mmol/L — AB (ref 135–145)
Total Bilirubin: 0.9 mg/dL (ref 0.3–1.2)
Total Protein: 6.5 g/dL (ref 6.5–8.1)

## 2017-03-06 LAB — CBC WITH DIFFERENTIAL/PLATELET
BASOS PCT: 1 %
Basophils Absolute: 0 10*3/uL (ref 0–0.1)
EOS ABS: 0.2 10*3/uL (ref 0–0.7)
Eosinophils Relative: 8 %
HEMATOCRIT: 25.8 % — AB (ref 40.0–52.0)
HEMOGLOBIN: 8.4 g/dL — AB (ref 13.0–18.0)
LYMPHS ABS: 0.5 10*3/uL — AB (ref 1.0–3.6)
Lymphocytes Relative: 23 %
MCH: 26.4 pg (ref 26.0–34.0)
MCHC: 32.6 g/dL (ref 32.0–36.0)
MCV: 81.1 fL (ref 80.0–100.0)
MONOS PCT: 15 %
Monocytes Absolute: 0.4 10*3/uL (ref 0.2–1.0)
NEUTROS ABS: 1.3 10*3/uL — AB (ref 1.4–6.5)
NEUTROS PCT: 53 %
Platelets: 149 10*3/uL — ABNORMAL LOW (ref 150–440)
RBC: 3.18 MIL/uL — AB (ref 4.40–5.90)
RDW: 19 % — ABNORMAL HIGH (ref 11.5–14.5)
WBC: 2.4 10*3/uL — AB (ref 3.8–10.6)

## 2017-03-06 MED ORDER — SODIUM CHLORIDE 0.9 % IV SOLN
Freq: Once | INTRAVENOUS | Status: AC
  Administered 2017-03-06: 14:00:00 via INTRAVENOUS
  Filled 2017-03-06: qty 1000

## 2017-03-06 MED ORDER — SODIUM CHLORIDE 0.9 % IV SOLN
240.0000 mg | Freq: Once | INTRAVENOUS | Status: AC
  Administered 2017-03-06: 240 mg via INTRAVENOUS
  Filled 2017-03-06: qty 24

## 2017-03-07 DIAGNOSIS — N179 Acute kidney failure, unspecified: Secondary | ICD-10-CM | POA: Diagnosis not present

## 2017-03-07 DIAGNOSIS — N183 Chronic kidney disease, stage 3 (moderate): Secondary | ICD-10-CM | POA: Diagnosis not present

## 2017-03-07 DIAGNOSIS — I10 Essential (primary) hypertension: Secondary | ICD-10-CM | POA: Diagnosis not present

## 2017-03-07 LAB — LIPASE, FLUID: Lipase-Fluid: 30 U/L

## 2017-03-11 ENCOUNTER — Other Ambulatory Visit
Admission: RE | Admit: 2017-03-11 | Discharge: 2017-03-11 | Disposition: A | Discharge status: Home / Self Care | Payer: Medicare HMO | Source: Ambulatory Visit | Attending: Gastroenterology | Admitting: Gastroenterology

## 2017-03-11 DIAGNOSIS — R188 Other ascites: Secondary | ICD-10-CM | POA: Diagnosis not present

## 2017-03-11 DIAGNOSIS — K7460 Unspecified cirrhosis of liver: Secondary | ICD-10-CM | POA: Diagnosis not present

## 2017-03-11 LAB — BASIC METABOLIC PANEL
Anion gap: 9 (ref 5–15)
BUN: 26 mg/dL — ABNORMAL HIGH (ref 6–20)
CALCIUM: 8.1 mg/dL — AB (ref 8.9–10.3)
CO2: 27 mmol/L (ref 22–32)
CREATININE: 1.44 mg/dL — AB (ref 0.61–1.24)
Chloride: 92 mmol/L — ABNORMAL LOW (ref 101–111)
GFR calc non Af Amer: 51 mL/min — ABNORMAL LOW (ref >60)
GFR, EST AFRICAN AMERICAN: 60 mL/min — AB (ref >60)
Glucose, Bld: 317 mg/dL — ABNORMAL HIGH (ref 65–99)
Potassium: 4.3 mmol/L (ref 3.5–5.1)
SODIUM: 128 mmol/L — AB (ref 135–145)

## 2017-03-12 LAB — HEPATITIS B DNA, ULTRAQUANTITATIVE, PCR
HBV DNA SERPL PCR-ACNC: NOT DETECTED [IU]/mL
HBV DNA SERPL PCR-LOG IU: UNDETERMINED {Log_IU}/mL

## 2017-03-13 LAB — ALKALINE PHOSPHATASE, ISOENZYMES
ALK PHOS BONE FRACT: 31 % (ref 12–68)
ALK PHOS LIVER FRACT: 68 % (ref 13–88)
ALK PHOS: 360 IU/L — AB (ref 39–117)
INTESTINAL %: 1 % (ref 0–18)

## 2017-03-15 LAB — MISC LABCORP TEST (SEND OUT): Labcorp test code: 823416

## 2017-03-17 NOTE — Progress Notes (Signed)
[name]  [phone] [phone]  ID: Rodney Stewart OB: Dec 17, 1955  MR#: [id number]  HEN#:[id number]  Patient Care Team: System, Pcp Not In as PCP - General  CHIEF COMPLAINT: Stage IV renal cell carcinoma with bilateral lung metastases.  INTERVAL HISTORY: Patient returns to clinic today for further evaluation and consideration of cycle 25 of nivolumab. He continues to tolerate his treatments well without significant side effects. He continues to have significant peripheral neuropathy in both feet secondary to diabetes, but has no other neurologic complaints.  He reports his blood glucose control is improved.  His ascites has improved with paracentesis with his last one on March 01, 2017.  He denies any fevers, chills, or recent illnesses. He has a good appetite and denies weight loss. He denies any chest pain, shortness of breath, cough, or hemoptysis. He has no nausea, vomiting, constipation, or diarrhea. He has no melena or hematochezia. He has no urinary complaints. Patient offers no further specific complaints today.   REVIEW OF SYSTEMS:   Review of Systems  Constitutional: Negative.  Negative for fever, malaise/fatigue and weight loss.  Eyes: Negative for blurred vision.  Respiratory: Negative for cough and shortness of breath.   Cardiovascular: Negative.  Negative for chest pain and leg swelling.  Gastrointestinal: Negative.  Negative for abdominal pain, constipation, diarrhea, nausea and vomiting.  Genitourinary: Negative.   Musculoskeletal: Negative.   Neurological: Positive for tingling and sensory change. Negative for weakness and headaches.  Psychiatric/Behavioral: Negative.  The patient is not nervous/anxious and does not have insomnia.    As per HPI. Otherwise, a complete review of systems is negative.   PAST MEDICAL HISTORY: Past Medical History:  Diagnosis Date  . Anemia   . BPH (benign prostatic hyperplasia)   . CKD (chronic  kidney disease) stage 3, GFR 30-59 ml/min (HCC)   . Clear cell renal cell carcinoma (HCC)   . Diabetes mellitus without complication ([place])   . Dyspnea    with exertion  . GERD (gastroesophageal reflux disease)   . History of gout   . History of nephrectomy    Left  . Hypertension   . Neuropathy   . Renal cell carcinoma of left kidney (HCC)    mets to lungs  . Umbilical hernia     PAST SURGICAL HISTORY: Past Surgical History:  Procedure Laterality Date  . BACK SURGERY    . ENDOBRONCHIAL ULTRASOUND N/A 10/14/2015   Procedure: ENDOBRONCHIAL ULTRASOUND;  Surgeon: [name], MD;  Location: [facility] ORS;  Service: Pulmonary;  Laterality: N/A;  . EYE SURGERY Bilateral    Cataract Extraction with IOL  . KNEE SURGERY Right   . NEPHRECTOMY      FAMILY HISTORY: Family History  Problem Relation Age of Onset  . Ovarian cancer Mother   . Diabetes Father   . Hypertension Father     ADVANCED DIRECTIVES (Y/N):  N  HEALTH MAINTENANCE: Social History   Tobacco Use  . Smoking status: Former Smoker    Packs/day: 1.00    Types: Cigarettes  . Smokeless tobacco: Never Used  Substance Use Topics  . Alcohol use: Yes    Alcohol/week: 25.2 oz    Types: 42 Cans of beer per week    Comment: 4-5 cans of beer a day  . Drug use: No     Colonoscopy:  PAP:  Bone density:  Lipid panel:  No Known Allergies  Current Outpatient Medications  Medication Sig Dispense Refill  . amLODipine (NORVASC) 10  MG tablet Take 10 mg by mouth daily.     Marland Kitchen aspirin 81 MG chewable tablet Chew 81 mg by mouth daily.    . colchicine 0.6 MG tablet Take 0.6 mg by mouth every other day.     . furosemide (LASIX) 40 MG tablet Take 1 tablet (40 mg total) by mouth daily. 61 tablet 0  . gabapentin (NEURONTIN) 100 MG capsule Take 200 mg by mouth 3 (three) times daily.     . insulin aspart (NOVOLOG) 100 UNIT/ML FlexPen Inject 15 Units into the skin daily.     . Insulin Detemir (LEVEMIR FLEXTOUCH) 100 UNIT/ML  Pen Inject 35 Units into the skin daily at 10 pm. 15 mL 11  . lovastatin (MEVACOR) 20 MG tablet Take 20 mg by mouth daily.     . Nebivolol HCl 20 MG TABS Take 20 mg by mouth daily.    Marland Kitchen spironolactone (ALDACTONE) 100 MG tablet Take 1 tablet (100 mg total) by mouth daily. 30 tablet 2  . tamsulosin (FLOMAX) 0.4 MG CAPS capsule Take 0.4 mg by mouth every other day.     Marland Kitchen acetaminophen (TYLENOL) 325 MG tablet Take 325 mg by mouth every 6 (six) hours as needed.    Marland Kitchen albuterol (PROVENTIL HFA;VENTOLIN HFA) 108 (90 Base) MCG/ACT inhaler Inhale 2 puffs into the lungs every 6 (six) hours as needed for wheezing or shortness of breath. (Patient not taking: Reported on 03/20/2017) 1 Inhaler 0  . ALPRAZolam (XANAX) 1 MG tablet Take 1 mg by mouth as needed for sleep.    Marland Kitchen lidocaine (XYLOCAINE) 2 % solution Use as directed 10 mLs in the mouth or throat as needed for mouth pain. (Patient not taking: Reported on 03/20/2017) 100 mL 0  . ONE TOUCH ULTRA TEST test strip     . polyethylene glycol (MIRALAX / GLYCOLAX) packet Take 17 g by mouth daily as needed for mild constipation. (Patient not taking: Reported on 03/20/2017) 14 each 0  . Pseudoeph-Doxylamine-DM-APAP (NYQUIL PO) Take by mouth as needed.     Current Facility-Administered Medications  Medication Dose Route Frequency Provider Last Rate Last Dose  . albumin human 25 % solution 12.5 g  12.5 g Intravenous Once Jonathon Bellows, MD      . albumin human 25 % solution 12.5 g  12.5 g Intravenous Once Jonathon Bellows, MD      . albumin human 5 % solution 25 g  25 g Intravenous Once Jonathon Bellows, MD        OBJECTIVE: Vitals:   03/20/17 1038  BP: 112/70  Pulse: 73  Resp: 18  Temp: (!) 95.5 F (35.3 C)     Body mass index is 24.34 kg/m.    ECOG FS:0 - Asymptomatic  General: Well-developed, well-nourished, no acute distress. Eyes: Pink conjunctiva, anicteric sclera. Lungs: Clear to auscultation bilaterally. Heart: Regular rate and rhythm. No rubs, murmurs, or  gallops. Abdomen: Distended, nontender.   Musculoskeletal: No edema, cyanosis, or clubbing. Neuro: Alert, answering all questions appropriately. Cranial nerves grossly intact. Skin: No rashes or petechiae noted. Psych: Normal affect.    LAB RESULTS:  Lab Results  Component Value Date   NA 129 (L) 03/20/2017   K 4.0 03/20/2017   CL 92 (L) 03/20/2017   CO2 29 03/20/2017   GLUCOSE 97 03/20/2017   BUN 28 (H) 03/20/2017   CREATININE 1.35 (H) 03/20/2017   CALCIUM 8.6 (L) 03/20/2017   PROT 6.9 03/20/2017   ALBUMIN 2.8 (L) 03/20/2017   AST 37 03/20/2017  ALT 17 03/20/2017   ALKPHOS 341 (H) 03/20/2017   BILITOT 1.5 (H) 03/20/2017   GFRNONAA 56 (L) 03/20/2017   GFRAA >60 03/20/2017    Lab Results  Component Value Date   WBC 2.5 (L) 03/20/2017   NEUTROABS 1.2 (L) 03/20/2017   HGB 8.1 (L) 03/20/2017   HCT 25.3 (L) 03/20/2017   MCV 77.2 (L) 03/20/2017   PLT 114 (L) 03/20/2017   Lab Results  Component Value Date   IRON 12 (L) 01/15/2017   TIBC 410 01/15/2017   IRONPCTSAT 3 (L) 01/15/2017   Lab Results  Component Value Date   FERRITIN 12 (L) 01/15/2017     STUDIES: US Paracentesis  Result Date: 03/01/2017 INDICATION: Metastatic renal cell carcinoma, renal insufficiency, cirrhosis, recurrent ascites. Request made for diagnostic and therapeutic paracentesis. EXAM: ULTRASOUND GUIDED DIAGNOSTIC AND THERAPEUTIC PARACENTESIS MEDICATIONS: None. COMPLICATIONS: None immediate. PROCEDURE: Informed written consent was obtained from the patient after a discussion of the risks, benefits and alternatives to treatment. A timeout was performed prior to the initiation of the procedure. Initial ultrasound scanning demonstrates a large amount of ascites within the right lower abdominal quadrant. The right lower abdomen was prepped and draped in the usual sterile fashion. 1% lidocaine was used for local anesthesia. Following this, a 6 Fr Safe-T-Centesis catheter was introduced. An ultrasound image  was saved for documentation purposes. The paracentesis was performed. The catheter was removed and a dressing was applied. The patient tolerated the procedure well without immediate post procedural complication. FINDINGS: A total of approximately 9 liters of yellow fluid was removed. Samples were sent to the laboratory as requested by the clinical team. IMPRESSION: Successful ultrasound-guided diagnostic and therapeutic paracentesis yielding 9 liters of peritoneal fluid. The patient will receive IV albumin infusion postprocedure. Read by: Rowe Robert, PA-C Electronically Signed   By: Markus Daft M.D.   On: 03/01/2017 09:32     Oncology history: Patient underwent left nephrectomy on May 16, 2012 which revealed a clear cell grade 2 renal cell carcinoma, stage TIIIa, N0, M0. Tumor size of 16 cm. Patient was noted to have renal vein involvement, but no other structures were involved. 0 of 2 lymph nodes were negative for disease. PET scan on October 20, 2015 revealed metastatic disease and patient was initiated on Votrient. This was subsequently discontinued in March 2018 secondary to progression of disease. Patient initiated second line treatment with nivolumab on Monday, April 02, 2016  ASSESSMENT: Stage IV left renal cell carcinoma with metastasis to the lungs  PLAN:    1. Stage IV left renal cell carcinoma with metastasis to the lungs: CT scan results from February 18, 2017 reviewed independently and report as above with continued improvement of disease burden. Proceed with cycle 25 of nivolumab today. Patient will receive a flat dose of 240 mg every 2 weeks until intolerable side effects or progression of disease. Return to clinic in 2 weeks for consideration of cycle 26 and then in 4 weeks for further evaluation and consideration of cycle 27.  Patient's insurance will no longer cover PET scan's, therefore we will continue monitoring disease status with CT scans every 3-4 months. 2. Pancytopenia: Patient's  blood counts are decreased, but essentially stable.  Possibly secondary to his underlying cirrhosis.  Can consider bone marrow biopsy in the future if necessary.   3. Renal insufficiency: Patient's creatinine is approximately at his baseline. Continue to monitor closely.  4. Peripheral neuropathy: Likely secondary to diabetes, continue current dose of gabapentin. Patient plans to  discuss with his primary care adjusting his medications possibly pursuing Lyrica. Monitor. 5.  Cirrhosis/ascites: Patient's most recent paracentesis was on March 01, 2017 or 9 L of fluid has been removed.  Continue monitoring and treatment per GI.   5. Hyperbilirubinemia: Bilirubin mildly elevated today.  Secondary cirrhosis, monitor.  6. Hyperglycemia: Improved. Unrelated to current treatment. Continue close follow-up with primary care. 7.  Hyponatremia: Decreased, but stable.  Monitor.   8.  Iron deficiency anemia: Patient's hemoglobin is trending down, consider IV iron in the near future.  Will draw iron stores with next lab encounter.  Patient received 510 mg IV Feraheme on January 23, 2017.  Monitor.   Patient expressed understanding and was in agreement with this plan. He also understands that He can call clinic at any time with any questions, concerns, or complaints.   Cancer Staging Metastatic renal cell carcinoma to lung [facility]) Staging form: Kidney, AJCC 7th Edition - Clinical stage from 10/12/2015: Stage IV (TX, N0, M1) - Signed by Lloyd Huger, MD on 10/12/2015   Lloyd Huger, MD 03/21/17 10:00 AM

## 2017-03-18 ENCOUNTER — Telehealth: Payer: Self-pay | Admitting: Gastroenterology

## 2017-03-18 NOTE — Telephone Encounter (Signed)
Pt thinks he needs another procedure paracentisis done please call pt

## 2017-03-19 ENCOUNTER — Other Ambulatory Visit: Payer: Self-pay

## 2017-03-19 DIAGNOSIS — K7031 Alcoholic cirrhosis of liver with ascites: Secondary | ICD-10-CM

## 2017-03-19 MED ORDER — ALBUMIN HUMAN 25 % IV SOLN: 12.5000 g | Freq: Once | INTRAVENOUS | Status: DC

## 2017-03-19 NOTE — Telephone Encounter (Signed)
Advised patient that I've sent the order to [facility] for scheduling paracentesis.   Patient to have labs completed at [facility] prior/after paracentesis.

## 2017-03-19 NOTE — Progress Notes (Signed)
Spoke with Rodney Stewart this morning.   He's beginning to feel lethargic and labored again. Feels he needs another paracentesis. He agree's that it's not as bad as before but it's getting there.   Dr. Vicente Males is requesting a Hep B surface antigen test.   Advised patient I would contact Dr. Vicente Males concerning another paracentesis. If we're to schedule, the order for the Hep B Surf Antigen will be input and he can do them both on the same day.   Patient waiting for callback.

## 2017-03-19 NOTE — Progress Notes (Unsigned)
Faxed paracentesis order to [facility] for scheduling.   Calling patient to advise paracentesis has been ordered and lab order is in the system.

## 2017-03-19 NOTE — Telephone Encounter (Signed)
-----   Message from Jonathon Bellows, MD sent at 03/19/2017 10:31 AM EDT ----- Regarding: RE: Paracentesis Yes go ahead   ----- Message ----- From: Leontine Locket, CMA Sent: 03/19/2017  10:20 AM To: Jonathon Bellows, MD Subject: Paracentesis                                   Rodney Stewart is requesting another paracentesis. He's beginning to feel lethargic and can feel the fluid buildup. He admits its not as bad as before but is getting there.   Can I send for scheduling?

## 2017-03-20 ENCOUNTER — Telehealth: Payer: Self-pay

## 2017-03-20 ENCOUNTER — Inpatient Hospital Stay: Payer: Medicare HMO

## 2017-03-20 ENCOUNTER — Inpatient Hospital Stay (HOSPITAL_BASED_OUTPATIENT_CLINIC_OR_DEPARTMENT_OTHER): Payer: Medicare HMO | Admitting: Oncology

## 2017-03-20 ENCOUNTER — Other Ambulatory Visit: Payer: Self-pay

## 2017-03-20 VITALS — BP 112/70 | HR 73 | Temp 95.5°F | Resp 18 | Wt 174.5 lb

## 2017-03-20 DIAGNOSIS — E871 Hypo-osmolality and hyponatremia: Secondary | ICD-10-CM | POA: Diagnosis not present

## 2017-03-20 DIAGNOSIS — C7800 Secondary malignant neoplasm of unspecified lung: Secondary | ICD-10-CM | POA: Diagnosis not present

## 2017-03-20 DIAGNOSIS — D509 Iron deficiency anemia, unspecified: Secondary | ICD-10-CM

## 2017-03-20 DIAGNOSIS — R188 Other ascites: Secondary | ICD-10-CM

## 2017-03-20 DIAGNOSIS — Z79899 Other long term (current) drug therapy: Secondary | ICD-10-CM | POA: Diagnosis not present

## 2017-03-20 DIAGNOSIS — C649 Malignant neoplasm of unspecified kidney, except renal pelvis: Secondary | ICD-10-CM

## 2017-03-20 DIAGNOSIS — D61818 Other pancytopenia: Secondary | ICD-10-CM

## 2017-03-20 DIAGNOSIS — K7460 Unspecified cirrhosis of liver: Secondary | ICD-10-CM | POA: Diagnosis not present

## 2017-03-20 DIAGNOSIS — C642 Malignant neoplasm of left kidney, except renal pelvis: Secondary | ICD-10-CM

## 2017-03-20 DIAGNOSIS — Z87891 Personal history of nicotine dependence: Secondary | ICD-10-CM

## 2017-03-20 DIAGNOSIS — C7802 Secondary malignant neoplasm of left lung: Principal | ICD-10-CM

## 2017-03-20 DIAGNOSIS — I129 Hypertensive chronic kidney disease with stage 1 through stage 4 chronic kidney disease, or unspecified chronic kidney disease: Secondary | ICD-10-CM | POA: Diagnosis not present

## 2017-03-20 DIAGNOSIS — N183 Chronic kidney disease, stage 3 (moderate): Secondary | ICD-10-CM | POA: Diagnosis not present

## 2017-03-20 DIAGNOSIS — Z905 Acquired absence of kidney: Secondary | ICD-10-CM

## 2017-03-20 DIAGNOSIS — Z5111 Encounter for antineoplastic chemotherapy: Secondary | ICD-10-CM | POA: Diagnosis not present

## 2017-03-20 LAB — COMPREHENSIVE METABOLIC PANEL
ALBUMIN: 2.8 g/dL — AB (ref 3.5–5.0)
ALT: 17 U/L (ref 17–63)
ANION GAP: 8 (ref 5–15)
AST: 37 U/L (ref 15–41)
Alkaline Phosphatase: 341 U/L — ABNORMAL HIGH (ref 38–126)
BILIRUBIN TOTAL: 1.5 mg/dL — AB (ref 0.3–1.2)
BUN: 28 mg/dL — ABNORMAL HIGH (ref 6–20)
CALCIUM: 8.6 mg/dL — AB (ref 8.9–10.3)
CO2: 29 mmol/L (ref 22–32)
Chloride: 92 mmol/L — ABNORMAL LOW (ref 101–111)
Creatinine, Ser: 1.35 mg/dL — ABNORMAL HIGH (ref 0.61–1.24)
GFR calc Af Amer: >60 mL/min (ref >60)
GFR, EST NON AFRICAN AMERICAN: 56 mL/min — AB (ref >60)
Glucose, Bld: 97 mg/dL (ref 65–99)
POTASSIUM: 4 mmol/L (ref 3.5–5.1)
Sodium: 129 mmol/L — ABNORMAL LOW (ref 135–145)
TOTAL PROTEIN: 6.9 g/dL (ref 6.5–8.1)

## 2017-03-20 LAB — CBC WITH DIFFERENTIAL/PLATELET
BASOS ABS: 0 10*3/uL (ref 0–0.1)
BASOS PCT: 1 %
Eosinophils Absolute: 0.2 10*3/uL (ref 0–0.7)
Eosinophils Relative: 10 %
HEMATOCRIT: 25.3 % — AB (ref 40.0–52.0)
Hemoglobin: 8.1 g/dL — ABNORMAL LOW (ref 13.0–18.0)
LYMPHS PCT: 25 %
Lymphs Abs: 0.6 10*3/uL — ABNORMAL LOW (ref 1.0–3.6)
MCH: 24.7 pg — ABNORMAL LOW (ref 26.0–34.0)
MCHC: 32 g/dL (ref 32.0–36.0)
MCV: 77.2 fL — AB (ref 80.0–100.0)
Monocytes Absolute: 0.4 10*3/uL (ref 0.2–1.0)
Monocytes Relative: 17 %
NEUTROS ABS: 1.2 10*3/uL — AB (ref 1.4–6.5)
Neutrophils Relative %: 47 %
Platelets: 114 10*3/uL — ABNORMAL LOW (ref 150–440)
RBC: 3.27 MIL/uL — ABNORMAL LOW (ref 4.40–5.90)
RDW: 17.6 % — ABNORMAL HIGH (ref 11.5–14.5)
WBC: 2.5 10*3/uL — AB (ref 3.8–10.6)

## 2017-03-20 MED ORDER — SODIUM CHLORIDE 0.9 % IV SOLN
240.0000 mg | Freq: Once | INTRAVENOUS | Status: AC
  Administered 2017-03-20: 240 mg via INTRAVENOUS
  Filled 2017-03-20: qty 24

## 2017-03-20 MED ORDER — SODIUM CHLORIDE 0.9 % IV SOLN
Freq: Once | INTRAVENOUS | Status: AC
  Administered 2017-03-20: 12:00:00 via INTRAVENOUS
  Filled 2017-03-20: qty 1000

## 2017-03-20 NOTE — Telephone Encounter (Signed)
Patient has been scheduled for paracentesis @ [facility] on Friday, 3/22 @ 730am.   Reminded about lab.

## 2017-03-20 NOTE — Progress Notes (Signed)
Here for follow up doing well he stated

## 2017-03-22 ENCOUNTER — Ambulatory Visit
Admission: RE | Admit: 2017-03-22 | Discharge: 2017-03-22 | Disposition: A | Discharge status: Home / Self Care | Payer: Medicare HMO | Source: Ambulatory Visit | Attending: Gastroenterology | Admitting: Gastroenterology

## 2017-03-22 ENCOUNTER — Other Ambulatory Visit
Admission: RE | Admit: 2017-03-22 | Discharge: 2017-03-22 | Disposition: A | Discharge status: Home / Self Care | Payer: Medicare HMO | Source: Ambulatory Visit | Attending: Oncology | Admitting: Oncology

## 2017-03-22 DIAGNOSIS — K7031 Alcoholic cirrhosis of liver with ascites: Secondary | ICD-10-CM | POA: Insufficient documentation

## 2017-03-22 DIAGNOSIS — R69 Illness, unspecified: Secondary | ICD-10-CM | POA: Diagnosis not present

## 2017-03-22 DIAGNOSIS — R188 Other ascites: Secondary | ICD-10-CM | POA: Diagnosis not present

## 2017-03-22 MED ORDER — ALBUMIN HUMAN 25 % IV SOLN
INTRAVENOUS | Status: AC
  Filled 2017-03-22: qty 100

## 2017-03-22 MED ORDER — ALBUMIN HUMAN 25 % IV SOLN
25.0000 g | Freq: Once | INTRAVENOUS | Status: AC
  Administered 2017-03-22: 25 g via INTRAVENOUS
  Filled 2017-03-22: qty 100

## 2017-03-22 NOTE — Addendum Note (Signed)
Addended by: Santiago Bur on: 03/22/2017 09:46 AM   Modules accepted: Orders

## 2017-03-23 LAB — HEPATITIS B SURFACE ANTIGEN: HEP B S AG: NEGATIVE

## 2017-03-23 NOTE — Procedures (Signed)
PROCEDURE SUMMARY:  Successful US guided paracentesis from right lateral abdomen.  Yielded 6.8 liters of clear yellow fluid.  No immediate complications.  Pt tolerated well.   WENDY S BLAIR PA-C 03/23/2017 1:47 PM

## 2017-03-26 DIAGNOSIS — H6983 Other specified disorders of Eustachian tube, bilateral: Secondary | ICD-10-CM | POA: Diagnosis not present

## 2017-03-26 DIAGNOSIS — J309 Allergic rhinitis, unspecified: Secondary | ICD-10-CM | POA: Diagnosis not present

## 2017-04-03 ENCOUNTER — Inpatient Hospital Stay: Payer: Medicare HMO | Attending: Oncology

## 2017-04-03 ENCOUNTER — Inpatient Hospital Stay: Payer: Medicare HMO

## 2017-04-03 VITALS — BP 129/72 | HR 75 | Temp 99.0°F | Resp 20 | Wt 167.4 lb

## 2017-04-03 DIAGNOSIS — I700 Atherosclerosis of aorta: Secondary | ICD-10-CM | POA: Diagnosis not present

## 2017-04-03 DIAGNOSIS — Z87891 Personal history of nicotine dependence: Secondary | ICD-10-CM | POA: Insufficient documentation

## 2017-04-03 DIAGNOSIS — Z79899 Other long term (current) drug therapy: Secondary | ICD-10-CM | POA: Diagnosis not present

## 2017-04-03 DIAGNOSIS — K7460 Unspecified cirrhosis of liver: Secondary | ICD-10-CM

## 2017-04-03 DIAGNOSIS — Z5111 Encounter for antineoplastic chemotherapy: Secondary | ICD-10-CM | POA: Insufficient documentation

## 2017-04-03 DIAGNOSIS — C7802 Secondary malignant neoplasm of left lung: Secondary | ICD-10-CM | POA: Insufficient documentation

## 2017-04-03 DIAGNOSIS — E871 Hypo-osmolality and hyponatremia: Secondary | ICD-10-CM | POA: Diagnosis not present

## 2017-04-03 DIAGNOSIS — E1122 Type 2 diabetes mellitus with diabetic chronic kidney disease: Secondary | ICD-10-CM | POA: Insufficient documentation

## 2017-04-03 DIAGNOSIS — C642 Malignant neoplasm of left kidney, except renal pelvis: Secondary | ICD-10-CM | POA: Diagnosis not present

## 2017-04-03 DIAGNOSIS — N183 Chronic kidney disease, stage 3 (moderate): Secondary | ICD-10-CM | POA: Diagnosis not present

## 2017-04-03 DIAGNOSIS — Z7982 Long term (current) use of aspirin: Secondary | ICD-10-CM | POA: Insufficient documentation

## 2017-04-03 DIAGNOSIS — I129 Hypertensive chronic kidney disease with stage 1 through stage 4 chronic kidney disease, or unspecified chronic kidney disease: Secondary | ICD-10-CM | POA: Insufficient documentation

## 2017-04-03 DIAGNOSIS — F1020 Alcohol dependence, uncomplicated: Secondary | ICD-10-CM | POA: Insufficient documentation

## 2017-04-03 DIAGNOSIS — Z794 Long term (current) use of insulin: Secondary | ICD-10-CM | POA: Insufficient documentation

## 2017-04-03 DIAGNOSIS — D61818 Other pancytopenia: Secondary | ICD-10-CM | POA: Diagnosis not present

## 2017-04-03 DIAGNOSIS — K219 Gastro-esophageal reflux disease without esophagitis: Secondary | ICD-10-CM | POA: Diagnosis not present

## 2017-04-03 DIAGNOSIS — C649 Malignant neoplasm of unspecified kidney, except renal pelvis: Secondary | ICD-10-CM

## 2017-04-03 DIAGNOSIS — Z905 Acquired absence of kidney: Secondary | ICD-10-CM | POA: Diagnosis not present

## 2017-04-03 DIAGNOSIS — R188 Other ascites: Secondary | ICD-10-CM | POA: Diagnosis not present

## 2017-04-03 DIAGNOSIS — D509 Iron deficiency anemia, unspecified: Secondary | ICD-10-CM | POA: Diagnosis not present

## 2017-04-03 DIAGNOSIS — C7801 Secondary malignant neoplasm of right lung: Secondary | ICD-10-CM | POA: Diagnosis not present

## 2017-04-03 LAB — COMPREHENSIVE METABOLIC PANEL
ALK PHOS: 328 U/L — AB (ref 38–126)
ALT: 20 U/L (ref 17–63)
AST: 40 U/L (ref 15–41)
Albumin: 2.9 g/dL — ABNORMAL LOW (ref 3.5–5.0)
Anion gap: 10 (ref 5–15)
BUN: 20 mg/dL (ref 6–20)
CALCIUM: 8.5 mg/dL — AB (ref 8.9–10.3)
CO2: 25 mmol/L (ref 22–32)
CREATININE: 1.4 mg/dL — AB (ref 0.61–1.24)
Chloride: 95 mmol/L — ABNORMAL LOW (ref 101–111)
GFR, EST NON AFRICAN AMERICAN: 53 mL/min — AB (ref >60)
Glucose, Bld: 175 mg/dL — ABNORMAL HIGH (ref 65–99)
Potassium: 4.3 mmol/L (ref 3.5–5.1)
SODIUM: 130 mmol/L — AB (ref 135–145)
TOTAL PROTEIN: 6.9 g/dL (ref 6.5–8.1)
Total Bilirubin: 1.8 mg/dL — ABNORMAL HIGH (ref 0.3–1.2)

## 2017-04-03 LAB — CBC WITH DIFFERENTIAL/PLATELET
BASOS ABS: 0 10*3/uL (ref 0–0.1)
BASOS PCT: 1 %
EOS ABS: 0.2 10*3/uL (ref 0–0.7)
Eosinophils Relative: 8 %
HEMATOCRIT: 26.3 % — AB (ref 40.0–52.0)
HEMOGLOBIN: 8.3 g/dL — AB (ref 13.0–18.0)
Lymphocytes Relative: 18 %
Lymphs Abs: 0.5 10*3/uL — ABNORMAL LOW (ref 1.0–3.6)
MCH: 23.9 pg — ABNORMAL LOW (ref 26.0–34.0)
MCHC: 31.6 g/dL — ABNORMAL LOW (ref 32.0–36.0)
MCV: 75.6 fL — ABNORMAL LOW (ref 80.0–100.0)
Monocytes Absolute: 0.3 10*3/uL (ref 0.2–1.0)
Monocytes Relative: 10 %
NEUTROS ABS: 1.9 10*3/uL (ref 1.4–6.5)
NEUTROS PCT: 63 %
Platelets: 142 10*3/uL — ABNORMAL LOW (ref 150–440)
RBC: 3.47 MIL/uL — AB (ref 4.40–5.90)
RDW: 17.2 % — ABNORMAL HIGH (ref 11.5–14.5)
WBC: 3 10*3/uL — AB (ref 3.8–10.6)

## 2017-04-03 LAB — IRON AND TIBC
IRON: 13 ug/dL — AB (ref 45–182)
Saturation Ratios: 4 % — ABNORMAL LOW (ref 17.9–39.5)
TIBC: 345 ug/dL (ref 250–450)
UIBC: 332 ug/dL

## 2017-04-03 LAB — FERRITIN: FERRITIN: 17 ng/mL — AB (ref 24–336)

## 2017-04-03 MED ORDER — SODIUM CHLORIDE 0.9 % IV SOLN
240.0000 mg | Freq: Once | INTRAVENOUS | Status: AC
  Administered 2017-04-03: 240 mg via INTRAVENOUS
  Filled 2017-04-03: qty 24

## 2017-04-03 MED ORDER — SODIUM CHLORIDE 0.9 % IV SOLN
Freq: Once | INTRAVENOUS | Status: AC
  Administered 2017-04-03: 15:00:00 via INTRAVENOUS
  Filled 2017-04-03: qty 1000

## 2017-04-04 ENCOUNTER — Ambulatory Visit (INDEPENDENT_AMBULATORY_CARE_PROVIDER_SITE_OTHER): Payer: Medicare HMO | Admitting: Gastroenterology

## 2017-04-04 ENCOUNTER — Encounter: Payer: Self-pay | Admitting: Gastroenterology

## 2017-04-04 VITALS — BP 122/74 | HR 71 | Ht 71.0 in | Wt 168.0 lb

## 2017-04-04 DIAGNOSIS — K7031 Alcoholic cirrhosis of liver with ascites: Secondary | ICD-10-CM | POA: Diagnosis not present

## 2017-04-04 DIAGNOSIS — R69 Illness, unspecified: Secondary | ICD-10-CM | POA: Diagnosis not present

## 2017-04-04 NOTE — Progress Notes (Signed)
Rodney Bellows MD, MRCP(U.K) [address]  [place]  [place], [place] [address]  Main: [phone]  Fax: [phone]   Primary Care Physician: System, Pcp Not In  Primary Gastroenterologist:  Dr. Jonathon Stewart   Chief Complaint  Patient presents with  . Follow-up    HPI: Rodney Stewart is a 62 y.o. male   Summary of history :  He is here to follow up for  chronic liver disease and cirrhosis likely from alcohol and actively still drinking . Cirrhosis diagnosed in early 2019  . He has a history of stage 4 RCC , b/l lung metastasis . CKD 3 and Diabetes Mellitus , on chemotherapy. He has pancytopenia and follows with Dr Rodney Stewart.  CT scan of the abdomen done on 02/18/2017 showed subcarinal lymphadenopathy. Multiple stable bilateral pulmonary nodules. Status post left nephrectomy. Liver features suggestive of cirrhosis splenomegaly recanalization of the paraumbilical vein. Large volume ascites. Cholelithiasis. Labs 02/19/17 : Immune to hep A. HCv ab -negative, Hep B e antigen positive , hep b c ab,HIV,AMA,Factin,ceruloplasmin   -negtive. PTH normal. A1AT -normal , TSH elevated. Cr 1.37 , GFR 55, alk phos 293, ALT 16 , AST 39 , Ascites - no SBP,SAAG-suggetsive of portal HTN.  He has consumed 6-7 beers a day x many years atleast 30 years.2 beers in the last two weeks   Interval history  03/04/2017-04/04/17  Hbsag, Hep B viral load -negative  Labs 04/2017 : Hb 8.3, MCV 75 ,Iron 13, Ferritin 17   Has not stopped drinking alcohol but says has cut down . He has cut down the salt but says it has been really challenging.   Belly less distended . Cant recall dose of his aldactone and lasix. Has not seen anyone from palliative care yet .   Current Outpatient Medications  Medication Sig Dispense Refill  . acetaminophen (TYLENOL) 325 MG tablet Take 325 mg by mouth every 6 (six) hours as needed.    Marland Kitchen albuterol (PROVENTIL HFA;VENTOLIN HFA) 108 (90 Base) MCG/ACT inhaler Inhale 2 puffs  into the lungs every 6 (six) hours as needed for wheezing or shortness of breath. 1 Inhaler 0  . ALPRAZolam (XANAX) 1 MG tablet Take 1 mg by mouth as needed for sleep.    Marland Kitchen amLODipine (NORVASC) 10 MG tablet Take 10 mg by mouth daily.     Marland Kitchen aspirin 81 MG chewable tablet Chew 81 mg by mouth daily.    . clotrimazole (MYCELEX) 10 MG troche DISSOLVE ONE T IN MOUTH FIVE TIMES PER DAY  0  . colchicine 0.6 MG tablet Take 0.6 mg by mouth every other day.     . fluticasone (FLONASE) 50 MCG/ACT nasal spray   0  . furosemide (LASIX) 40 MG tablet Take 1 tablet (40 mg total) by mouth daily. 61 tablet 0  . gabapentin (NEURONTIN) 100 MG capsule Take 200 mg by mouth 3 (three) times daily.     . insulin aspart (NOVOLOG) 100 UNIT/ML FlexPen Inject 15 Units into the skin daily.     . Insulin Detemir (LEVEMIR FLEXTOUCH) 100 UNIT/ML Pen Inject 35 Units into the skin daily at 10 pm. 15 mL 11  . lidocaine (XYLOCAINE) 2 % solution Use as directed 10 mLs in the mouth or throat as needed for mouth pain. 100 mL 0  . lovastatin (MEVACOR) 20 MG tablet Take 20 mg by mouth daily.     . Nebivolol HCl 20 MG TABS Take 20 mg by mouth daily.    Marland Kitchen  ONE TOUCH ULTRA TEST test strip     . polyethylene glycol (MIRALAX / GLYCOLAX) packet Take 17 g by mouth daily as needed for mild constipation. 14 each 0  . Pseudoeph-Doxylamine-DM-APAP (NYQUIL PO) Take by mouth as needed.    Marland Kitchen spironolactone (ALDACTONE) 100 MG tablet Take 1 tablet (100 mg total) by mouth daily. 30 tablet 2  . tamsulosin (FLOMAX) 0.4 MG CAPS capsule Take 0.4 mg by mouth every other day.      Current Facility-Administered Medications  Medication Dose Route Frequency Provider Last Rate Last Dose  . albumin human 25 % solution 12.5 g  12.5 g Intravenous Once Rodney Bellows, MD      . albumin human 25 % solution 12.5 g  12.5 g Intravenous Once Rodney Bellows, MD      . albumin human 5 % solution 25 g  25 g Intravenous Once Rodney Bellows, MD        Allergies as of 04/04/2017  .  (No Known Allergies)    ROS:  General: Negative for anorexia, weight loss, fever, chills, fatigue, weakness. ENT: Negative for hoarseness, difficulty swallowing , nasal congestion. CV: Negative for chest pain, angina, palpitations, dyspnea on exertion, peripheral edema.  Respiratory: Negative for dyspnea at rest, dyspnea on exertion, cough, sputum, wheezing.  GI: See history of present illness. GU:  Negative for dysuria, hematuria, urinary incontinence, urinary frequency, nocturnal urination.  Endo: Negative for unusual weight change.    Physical Examination:   BP 122/74 (BP Location: Right Arm, Patient Position: Sitting, Cuff Size: Normal)   Pulse 71   Ht _0  (1.803 m)   Wt 168 lb (76.2 kg)   BMI 23.43 kg/m   General: Well-nourished, well-developed in no acute distress.  Eyes: No icterus. Conjunctivae pink. Mouth: Oropharyngeal mucosa moist and pink , no lesions erythema or exudate. Lungs: Clear to auscultation bilaterally. Non-labored. Heart: Regular rate and rhythm, no murmurs rubs or gallops.  Abdomen: Bowel sounds are normal, nontender, nondistended, no hepatosplenomegaly or masses, no abdominal bruits or hernia , no rebound or guarding.   Extremities: No lower extremity edema. No clubbing or deformities. Neuro: Alert and oriented x 3.  Grossly intact. Skin: Warm and dry, no jaundice.   Psych: Alert and cooperative, normal mood and affect.   Imaging Studies: US Paracentesis  Result Date: 03/22/2017 INDICATION: Metastatic renal cell carcinoma, renal insufficiency, cirrhosis, recurrent ascites. Request for therapeutic paracentesis. EXAM: ULTRASOUND GUIDED RIGHT LATERAL ABDOMEN PARACENTESIS MEDICATIONS: 1% Lidocaine = 10 mL COMPLICATIONS: None immediate. PROCEDURE: Informed written consent was obtained from the patient after a discussion of the risks, benefits and alternatives to treatment. A timeout was performed prior to the initiation of the procedure. Initial ultrasound  scanning demonstrates a large amount of ascites within the right lateral abdomen. The right lateral abdomen was prepped and draped in the usual sterile fashion. 1% lidocaine with epinephrine was used for local anesthesia. Following this, a 6 Fr Safe-T-Centesis catheter was introduced. An ultrasound image was saved for documentation purposes. The paracentesis was performed. The catheter was removed and a dressing was applied. The patient tolerated the procedure well without immediate post procedural complication. FINDINGS: A total of approximately 6.8 liters of clear yellow fluid was removed. IMPRESSION: Successful ultrasound-guided paracentesis yielding 6.8 liters of peritoneal fluid. Read by: Gareth Eagle, PA-C Electronically Signed   By: Marybelle Killings M.D.   On: 03/22/2017 09:57    Assessment and Plan:   DEANTHONY MAULL is a 62 y.o. y/o male here to  follow up  for chronic liver disease likely secondary to alcohol. Still drinking alcohol. No encephelopathy. Elevated alkaline phosphotase likely from metastatic Caruthers   Plan  1. He will call me to discuss his doses of aldactone and lasix following which will make changes  2.Low salt diet , avoid tylenol  4. Suggest PCP to address elevated TSH- switching over to a PCP closer to [place].  5. RUQ USG in 6 months to screen for [place] 6. EGD scheduled 04/15/17 to screen for varices  7. Suggest referral to palliative care to discuss advanced care directives due to two chronic conditions with potentional for decompensation.  8. Follows with Dr Rodney Stewart - would benefit from iv iron.   Dr Rodney Bellows  MD,MRCP [facility]) Follow up in 4 weeks

## 2017-04-05 ENCOUNTER — Telehealth: Payer: Self-pay

## 2017-04-05 DIAGNOSIS — K7031 Alcoholic cirrhosis of liver with ascites: Secondary | ICD-10-CM

## 2017-04-05 NOTE — Telephone Encounter (Signed)
-----   Message from Jonathon Bellows, MD sent at 04/04/2017  4:29 PM EDT ----- Regarding: RE: Rx Update Increase to 100 Aldactone and stay at 40 Lasix bmp in one week ----- Message ----- From: Leontine Locket, CMA Sent: 04/04/2017   3:57 PM To: Jonathon Bellows, MD Subject: Rx Update                                      Mr. Blumenfeld called back with his Rx update:  Furosemide 40mg  daily Spironolactone 50mg  daily  Please advise.

## 2017-04-05 NOTE — Telephone Encounter (Signed)
Advised patient of dose changes:   - Increase to 100 Aldactone and stay at 40 Lasix.  - BMP in one week.   Placed lab order.

## 2017-04-09 DIAGNOSIS — R69 Illness, unspecified: Secondary | ICD-10-CM | POA: Diagnosis not present

## 2017-04-10 ENCOUNTER — Telehealth: Payer: Self-pay | Admitting: Gastroenterology

## 2017-04-10 NOTE — Telephone Encounter (Signed)
Pt left vm to ask about what time his procedure is Monday he also states he has another question please call ptr

## 2017-04-11 ENCOUNTER — Other Ambulatory Visit: Payer: Self-pay | Admitting: *Deleted

## 2017-04-12 ENCOUNTER — Encounter: Payer: Self-pay | Admitting: *Deleted

## 2017-04-13 ENCOUNTER — Other Ambulatory Visit: Payer: Self-pay | Admitting: Gastroenterology

## 2017-04-13 DIAGNOSIS — K7460 Unspecified cirrhosis of liver: Secondary | ICD-10-CM

## 2017-04-13 DIAGNOSIS — R188 Other ascites: Principal | ICD-10-CM

## 2017-04-14 NOTE — Progress Notes (Signed)
[place]  [phone]) [phone] Fax:(336) [phone]  ID: Rodney Stewart OB: [birth year]  MR#: [id number]  NOM#:[id number]  Patient Care Team: System, Pcp Not In as PCP - General  CHIEF COMPLAINT: Stage IV renal cell carcinoma with bilateral lung metastases.  INTERVAL HISTORY: Patient returns to clinic today for further evaluation and consideration of cycle 26 of nivolumab.  His abdominal pain and swelling have significantly improved and his ascites and cirrhosis are being managed by GI.  He continues to have significant peripheral neuropathy in both feet secondary to diabetes, but has no other neurologic complaints.  He has chronic weakness and fatigue.  He denies any fevers, chills, or recent illnesses. He has a good appetite and denies weight loss. He denies any chest pain, shortness of breath, cough, or hemoptysis. He has no nausea, vomiting, constipation, or diarrhea. He has no melena or hematochezia. He has no urinary complaints.  Patient offers no further specific complaints today.  REVIEW OF SYSTEMS:   Review of Systems  Constitutional: Positive for malaise/fatigue. Negative for fever and weight loss.  Eyes: Negative for blurred vision.  Respiratory: Negative.  Negative for cough and shortness of breath.   Cardiovascular: Negative.  Negative for chest pain and leg swelling.  Gastrointestinal: Negative.  Negative for abdominal pain, constipation, diarrhea, nausea and vomiting.  Genitourinary: Negative.  Negative for dysuria.  Musculoskeletal: Negative.   Skin: Negative.  Negative for rash.  Neurological: Positive for tingling, sensory change and weakness. Negative for focal weakness and headaches.  Psychiatric/Behavioral: Negative.  The patient is not nervous/anxious and does not have insomnia.    As per HPI. Otherwise, a complete review of systems is negative.   PAST MEDICAL HISTORY: Past Medical History:  Diagnosis Date  . Anemia   . BPH (benign prostatic  hyperplasia)   . CKD (chronic kidney disease) stage 3, GFR 30-59 ml/min (HCC)   . Clear cell renal cell carcinoma (HCC)   . Diabetes mellitus without complication ([place])   . Dyspnea    with exertion  . GERD (gastroesophageal reflux disease)   . History of gout   . History of nephrectomy    Left  . Hypertension   . Neuropathy   . Renal cell carcinoma of left kidney (HCC)    mets to lungs  . Umbilical hernia     PAST SURGICAL HISTORY: Past Surgical History:  Procedure Laterality Date  . BACK SURGERY    . ENDOBRONCHIAL ULTRASOUND N/A 10/14/2015   Procedure: ENDOBRONCHIAL ULTRASOUND;  Surgeon: [name], MD;  Location: [facility] ORS;  Service: Pulmonary;  Laterality: N/A;  . ESOPHAGOGASTRODUODENOSCOPY (EGD) WITH PROPOFOL N/A 04/15/2017   Procedure: ESOPHAGOGASTRODUODENOSCOPY (EGD) WITH PROPOFOL;  Surgeon: Jonathon Bellows, MD;  Location[facility] ENDOSCOPY;  Service: Gastroenterology;  Laterality: N/A;  Screen for esophageal varices  . EYE SURGERY Bilateral    Cataract Extraction with IOL  . KNEE SURGERY Right   . NEPHRECTOMY      FAMILY HISTORY: Family History  Problem Relation Age of Onset  . Ovarian cancer Mother   . Diabetes Father   . Hypertension Father     ADVANCED DIRECTIVES (Y/N):  N  HEALTH MAINTENANCE: Social History   Tobacco Use  . Smoking status: Former Smoker    Packs/day: 1.00    Types: Cigarettes    Last attempt to quit: 04/15/1993    Years since quitting: 24.0  . Smokeless tobacco: Never Used  Substance Use Topics  . Alcohol use: Yes    Alcohol/week: 25.2 oz  Types: 42 Cans of beer per week    Comment: 4-5 cans of beer a day  . Drug use: No     Colonoscopy:  PAP:  Bone density:  Lipid panel:  No Known Allergies  Current Outpatient Medications  Medication Sig Dispense Refill  . acetaminophen (TYLENOL) 325 MG tablet Take 325 mg by mouth every 6 (six) hours as needed.    Marland Kitchen albuterol (PROVENTIL HFA;VENTOLIN HFA) 108 (90 Base) MCG/ACT inhaler  Inhale 2 puffs into the lungs every 6 (six) hours as needed for wheezing or shortness of breath. 1 Inhaler 0  . ALPRAZolam (XANAX) 1 MG tablet Take 1 mg by mouth as needed for sleep.    Marland Kitchen amLODipine (NORVASC) 10 MG tablet Take 10 mg by mouth daily.     Marland Kitchen aspirin 81 MG chewable tablet Chew 81 mg by mouth daily.    . clotrimazole (MYCELEX) 10 MG troche DISSOLVE ONE T IN MOUTH FIVE TIMES PER DAY  0  . colchicine 0.6 MG tablet Take 0.6 mg by mouth every other day.     . fluticasone (FLONASE) 50 MCG/ACT nasal spray   0  . furosemide (LASIX) 40 MG tablet Take 1 tablet (40 mg total) by mouth daily. 61 tablet 0  . gabapentin (NEURONTIN) 100 MG capsule Take 200 mg by mouth 3 (three) times daily.     . insulin aspart (NOVOLOG) 100 UNIT/ML FlexPen Inject 15 Units into the skin daily.     . Insulin Detemir (LEVEMIR FLEXTOUCH) 100 UNIT/ML Pen Inject 35 Units into the skin daily at 10 pm. 15 mL 11  . lidocaine (XYLOCAINE) 2 % solution Use as directed 10 mLs in the mouth or throat as needed for mouth pain. 100 mL 0  . losartan (COZAAR) 25 MG tablet Take by mouth.    . lovastatin (MEVACOR) 20 MG tablet Take 20 mg by mouth daily.     . Nebivolol HCl 20 MG TABS Take 20 mg by mouth daily.    . ONE TOUCH ULTRA TEST test strip     . Pseudoeph-Doxylamine-DM-APAP (NYQUIL PO) Take by mouth as needed.    . sertraline (ZOLOFT) 50 MG tablet Take by mouth.    . spironolactone (ALDACTONE) 100 MG tablet Take 1 tablet (100 mg total) by mouth daily. 30 tablet 2  . spironolactone (ALDACTONE) 50 MG tablet TAKE 1 TABLET BY MOUTH ONCE DAILY 60 tablet 0  . tamsulosin (FLOMAX) 0.4 MG CAPS capsule Take 0.4 mg by mouth every other day.     . polyethylene glycol (MIRALAX / GLYCOLAX) packet Take 17 g by mouth daily as needed for mild constipation. (Patient not taking: Reported on 04/15/2017) 14 each 0   Current Facility-Administered Medications  Medication Dose Route Frequency Provider Last Rate Last Dose  . albumin human 25 %  solution 12.5 g  12.5 g Intravenous Once Jonathon Bellows, MD      . albumin human 25 % solution 12.5 g  12.5 g Intravenous Once Jonathon Bellows, MD      . albumin human 5 % solution 25 g  25 g Intravenous Once Jonathon Bellows, MD        OBJECTIVE: Vitals:   04/17/17 1048  BP: 121/71  Pulse: 72  Resp: 20  Temp: 98.1 F (36.7 C)     Body mass index is 22.71 kg/m.    ECOG FS:0 - Asymptomatic  General: Well-developed, well-nourished, no acute distress. Eyes: Pink conjunctiva, anicteric sclera. Lungs: Clear to auscultation bilaterally. Heart: Regular rate  and rhythm. No rubs, murmurs, or gallops. Abdomen: Distended, nontender, reducible umbilical hernia. Musculoskeletal: No edema, cyanosis, or clubbing. Neuro: Alert, answering all questions appropriately. Cranial nerves grossly intact. Skin: No rashes or petechiae noted. Psych: Normal affect.    LAB RESULTS:  Lab Results  Component Value Date   NA 131 (L) 04/17/2017   K 4.0 04/17/2017   CL 93 (L) 04/17/2017   CO2 28 04/17/2017   GLUCOSE 97 04/17/2017   BUN 16 04/17/2017   CREATININE 1.31 (H) 04/17/2017   CALCIUM 9.0 04/17/2017   PROT 7.5 04/17/2017   ALBUMIN 3.2 (L) 04/17/2017   AST 32 04/17/2017   ALT 18 04/17/2017   ALKPHOS 349 (H) 04/17/2017   BILITOT 1.6 (H) 04/17/2017   GFRNONAA 58 (L) 04/17/2017   GFRAA >60 04/17/2017    Lab Results  Component Value Date   WBC 2.8 (L) 04/17/2017   NEUTROABS 1.6 04/17/2017   HGB 8.8 (L) 04/17/2017   HCT 28.1 (L) 04/17/2017   MCV 72.4 (L) 04/17/2017   PLT 151 04/17/2017   Lab Results  Component Value Date   IRON 13 (L) 04/03/2017   TIBC 345 04/03/2017   IRONPCTSAT 4 (L) 04/03/2017   Lab Results  Component Value Date   FERRITIN 17 (L) 04/03/2017     STUDIES: US Paracentesis  Result Date: 03/22/2017 INDICATION: Metastatic renal cell carcinoma, renal insufficiency, cirrhosis, recurrent ascites. Request for therapeutic paracentesis. EXAM: ULTRASOUND GUIDED RIGHT LATERAL ABDOMEN  PARACENTESIS MEDICATIONS: 1% Lidocaine = 10 mL COMPLICATIONS: None immediate. PROCEDURE: Informed written consent was obtained from the patient after a discussion of the risks, benefits and alternatives to treatment. A timeout was performed prior to the initiation of the procedure. Initial ultrasound scanning demonstrates a large amount of ascites within the right lateral abdomen. The right lateral abdomen was prepped and draped in the usual sterile fashion. 1% lidocaine with epinephrine was used for local anesthesia. Following this, a 6 Fr Safe-T-Centesis catheter was introduced. An ultrasound image was saved for documentation purposes. The paracentesis was performed. The catheter was removed and a dressing was applied. The patient tolerated the procedure well without immediate post procedural complication. FINDINGS: A total of approximately 6.8 liters of clear yellow fluid was removed. IMPRESSION: Successful ultrasound-guided paracentesis yielding 6.8 liters of peritoneal fluid. Read by: Gareth Eagle, PA-C Electronically Signed   By: Marybelle Killings M.D.   On: 03/22/2017 09:57     Oncology history: Patient underwent left nephrectomy on May 16, 2012 which revealed a clear cell grade 2 renal cell carcinoma, stage TIIIa, N0, M0. Tumor size of 16 cm. Patient was noted to have renal vein involvement, but no other structures were involved. 0 of 2 lymph nodes were negative for disease. PET scan on October 20, 2015 revealed metastatic disease and patient was initiated on Votrient. This was subsequently discontinued in March 2018 secondary to progression of disease. Patient initiated second line treatment with nivolumab on Monday, April 02, 2016  ASSESSMENT: Stage IV left renal cell carcinoma with metastasis to the lungs  PLAN:    1. Stage IV left renal cell carcinoma with metastasis to the lungs: CT scan results from February 18, 2017 reviewed independently with continued improvement of disease burden. Proceed with  cycle 26 of nivolumab today. Patient will receive a flat dose of 240 mg every 2 weeks until intolerable side effects or progression of disease. Return to clinic in 2 weeks for consideration of cycle 27 and then in 4 weeks for further evaluation and consideration  of cycle 28.  Patient's insurance will no longer cover PET scan's, therefore we will continue monitoring disease status with CT scans every 3-4 months. 2. Pancytopenia: Chronic.  Likely secondary to bone marrow suppression from patient's history of alcoholism as well as underlying cirrhosis. Can consider bone marrow biopsy in the future if necessary.   3. Renal insufficiency: Patient's creatinine is 1.31 which is approximately his baseline.  Continue to monitor closely.  4. Peripheral neuropathy: Chronic and unchanged.  Likely secondary to diabetes, continue current dose of gabapentin. Patient plans to discuss with his primary care adjusting his medications possibly pursuing Lyrica. Monitor. 5.  Cirrhosis/ascites: Appreciate GI input.  Patient's most recent paracentesis was on March 22, 2017 for approximately 6.8 L of fluid was removed. Continue monitoring and treatment per GI.   5. Hyperbilirubinemia: Secondary to cirrhosis.  Bilirubin mildly elevated today, proceed with treatment as above.  6. Hyperglycemia: Patient appears to have improved glycemic control.  Unrelated to current treatment. Continue close follow-up with primary care. 7.  Hyponatremia: Decreased, but stable.  Monitor.   8.  Iron deficiency anemia: Patient's hemoglobin and iron stores are decreased.  We will proceed with 510 mg IV Feraheme today.  Patient will receive a second dose with his next infusion of nivolumab.    Patient expressed understanding and was in agreement with this plan. He also understands that He can call clinic at any time with any questions, concerns, or complaints.   Cancer Staging Metastatic renal cell carcinoma to lung [facility]) Staging form: Kidney, AJCC  7th Edition - Clinical stage from 10/12/2015: Stage IV (TX, N0, M1) - Signed by Lloyd Huger, MD on 10/12/2015   Lloyd Huger, MD 04/21/17 8:19 AM

## 2017-04-15 ENCOUNTER — Ambulatory Visit
Admission: RE | Admit: 2017-04-15 | Discharge: 2017-04-15 | Disposition: A | Discharge status: Home / Self Care | Payer: Medicare HMO | Source: Ambulatory Visit | Attending: Gastroenterology | Admitting: Gastroenterology

## 2017-04-15 ENCOUNTER — Encounter: Payer: Self-pay | Admitting: *Deleted

## 2017-04-15 ENCOUNTER — Ambulatory Visit: Payer: Medicare HMO | Admitting: Anesthesiology

## 2017-04-15 ENCOUNTER — Encounter
Admission: RE | Disposition: A | Discharge status: Home / Self Care | Payer: Self-pay | Source: Ambulatory Visit | Attending: Gastroenterology

## 2017-04-15 DIAGNOSIS — N400 Enlarged prostate without lower urinary tract symptoms: Secondary | ICD-10-CM | POA: Insufficient documentation

## 2017-04-15 DIAGNOSIS — E1122 Type 2 diabetes mellitus with diabetic chronic kidney disease: Secondary | ICD-10-CM | POA: Diagnosis not present

## 2017-04-15 DIAGNOSIS — Z7982 Long term (current) use of aspirin: Secondary | ICD-10-CM | POA: Diagnosis not present

## 2017-04-15 DIAGNOSIS — Z85528 Personal history of other malignant neoplasm of kidney: Secondary | ICD-10-CM | POA: Diagnosis not present

## 2017-04-15 DIAGNOSIS — K3189 Other diseases of stomach and duodenum: Secondary | ICD-10-CM | POA: Diagnosis not present

## 2017-04-15 DIAGNOSIS — D509 Iron deficiency anemia, unspecified: Secondary | ICD-10-CM | POA: Diagnosis not present

## 2017-04-15 DIAGNOSIS — Z79899 Other long term (current) drug therapy: Secondary | ICD-10-CM | POA: Insufficient documentation

## 2017-04-15 DIAGNOSIS — Z794 Long term (current) use of insulin: Secondary | ICD-10-CM | POA: Diagnosis not present

## 2017-04-15 DIAGNOSIS — I129 Hypertensive chronic kidney disease with stage 1 through stage 4 chronic kidney disease, or unspecified chronic kidney disease: Secondary | ICD-10-CM | POA: Insufficient documentation

## 2017-04-15 DIAGNOSIS — K7460 Unspecified cirrhosis of liver: Secondary | ICD-10-CM | POA: Diagnosis not present

## 2017-04-15 DIAGNOSIS — K219 Gastro-esophageal reflux disease without esophagitis: Secondary | ICD-10-CM | POA: Insufficient documentation

## 2017-04-15 DIAGNOSIS — Z87891 Personal history of nicotine dependence: Secondary | ICD-10-CM | POA: Diagnosis not present

## 2017-04-15 DIAGNOSIS — R188 Other ascites: Secondary | ICD-10-CM | POA: Diagnosis not present

## 2017-04-15 DIAGNOSIS — N183 Chronic kidney disease, stage 3 (moderate): Secondary | ICD-10-CM | POA: Insufficient documentation

## 2017-04-15 DIAGNOSIS — K766 Portal hypertension: Secondary | ICD-10-CM | POA: Insufficient documentation

## 2017-04-15 DIAGNOSIS — T18128A Food in esophagus causing other injury, initial encounter: Secondary | ICD-10-CM | POA: Diagnosis not present

## 2017-04-15 DIAGNOSIS — E1140 Type 2 diabetes mellitus with diabetic neuropathy, unspecified: Secondary | ICD-10-CM | POA: Diagnosis not present

## 2017-04-15 DIAGNOSIS — D649 Anemia, unspecified: Secondary | ICD-10-CM

## 2017-04-15 HISTORY — PX: ESOPHAGOGASTRODUODENOSCOPY (EGD) WITH PROPOFOL: SHX5813

## 2017-04-15 LAB — GLUCOSE, CAPILLARY: Glucose-Capillary: 378 mg/dL — ABNORMAL HIGH (ref 65–99)

## 2017-04-15 SURGERY — ESOPHAGOGASTRODUODENOSCOPY (EGD) WITH PROPOFOL
Anesthesia: General

## 2017-04-15 MED ORDER — FENTANYL CITRATE (PF) 100 MCG/2ML IJ SOLN
INTRAMUSCULAR | Status: DC | PRN
  Administered 2017-04-15: 100 ug via INTRAVENOUS

## 2017-04-15 MED ORDER — HEPARIN SOD (PORK) LOCK FLUSH 100 UNIT/ML IV SOLN: 250.0000 [IU] | INTRAVENOUS | Status: DC | PRN

## 2017-04-15 MED ORDER — SODIUM CHLORIDE 0.9% FLUSH: 10.0000 mL | INTRAVENOUS | Status: DC | PRN

## 2017-04-15 MED ORDER — INSULIN ASPART 100 UNIT/ML ~~LOC~~ SOLN
8.0000 [IU] | Freq: Once | SUBCUTANEOUS | Status: AC
  Administered 2017-04-15: 8 [IU] via SUBCUTANEOUS

## 2017-04-15 MED ORDER — PROPOFOL 10 MG/ML IV BOLUS
INTRAVENOUS | Status: DC | PRN
  Administered 2017-04-15: 100 mg via INTRAVENOUS

## 2017-04-15 MED ORDER — HEPARIN SOD (PORK) LOCK FLUSH 100 UNIT/ML IV SOLN: 500.0000 [IU] | Freq: Every day | INTRAVENOUS | Status: DC | PRN

## 2017-04-15 MED ORDER — INSULIN ASPART 100 UNIT/ML ~~LOC~~ SOLN
SUBCUTANEOUS | Status: AC
  Administered 2017-04-15: 8 [IU] via SUBCUTANEOUS
  Filled 2017-04-15: qty 1

## 2017-04-15 MED ORDER — FENTANYL CITRATE (PF) 100 MCG/2ML IJ SOLN
INTRAMUSCULAR | Status: AC
  Filled 2017-04-15: qty 2

## 2017-04-15 MED ORDER — PROPOFOL 500 MG/50ML IV EMUL
INTRAVENOUS | Status: AC
  Filled 2017-04-15: qty 50

## 2017-04-15 MED ORDER — SODIUM CHLORIDE 0.9 % IV SOLN
INTRAVENOUS | Status: DC
  Administered 2017-04-15: 09:00:00 via INTRAVENOUS

## 2017-04-15 MED ORDER — SODIUM CHLORIDE 0.9% FLUSH: 3.0000 mL | INTRAVENOUS | Status: DC | PRN

## 2017-04-15 NOTE — Anesthesia Post-op Follow-up Note (Signed)
Anesthesia QCDR form completed.        

## 2017-04-15 NOTE — H&P (Signed)
Jonathon Bellows, MD [address] [place], [place], [place], [address] [address], [place], [place], [place], [address] Phone: [phone]  Fax: [phone]  Primary Care Physician:  System, Pcp Not In   Pre-Procedure History & Physical: HPI:  Rodney Stewart is a 62 y.o. male is here for an endoscopy    Past Medical History:  Diagnosis Date  . Anemia   . BPH (benign prostatic hyperplasia)   . CKD (chronic kidney disease) stage 3, GFR 30-59 ml/min (HCC)   . Clear cell renal cell carcinoma (HCC)   . Diabetes mellitus without complication ([place])   . Dyspnea    with exertion  . GERD (gastroesophageal reflux disease)   . History of gout   . History of nephrectomy    Left  . Hypertension   . Neuropathy   . Renal cell carcinoma of left kidney (HCC)    mets to lungs  . Umbilical hernia     Past Surgical History:  Procedure Laterality Date  . BACK SURGERY    . ENDOBRONCHIAL ULTRASOUND N/A 10/14/2015   Procedure: ENDOBRONCHIAL ULTRASOUND;  Surgeon: [name], MD;  Location: [facility] ORS;  Service: Pulmonary;  Laterality: N/A;  . EYE SURGERY Bilateral    Cataract Extraction with IOL  . KNEE SURGERY Right   . NEPHRECTOMY      Prior to Admission medications   Medication Sig Start Date End Date Taking? Authorizing Provider  acetaminophen (TYLENOL) 325 MG tablet Take 325 mg by mouth every 6 (six) hours as needed.   Yes [provider]  albuterol (PROVENTIL HFA;VENTOLIN HFA) 108 (90 Base) MCG/ACT inhaler Inhale 2 puffs into the lungs every 6 (six) hours as needed for wheezing or shortness of breath. 01/27/17  Yes Gouru, Aruna, MD  ALPRAZolam (XANAX) 1 MG tablet Take 1 mg by mouth as needed for sleep. 06/10/16  Yes [provider]  amLODipine (NORVASC) 10 MG tablet Take 10 mg by mouth daily.  01/06/16  Yes [provider]  aspirin 81 MG chewable tablet Chew 81 mg by mouth daily.   Yes [provider]  colchicine 0.6 MG tablet Take 0.6 mg  by mouth every other day.  06/26/15  Yes [provider]  fluticasone Asencion Islam) 50 MCG/ACT nasal spray  03/26/17  Yes [provider]  furosemide (LASIX) 40 MG tablet Take 1 tablet (40 mg total) by mouth daily. 03/04/17 05/04/17 Yes Jonathon Bellows, MD  gabapentin (NEURONTIN) 100 MG capsule Take 200 mg by mouth 3 (three) times daily.    Yes [provider]  insulin aspart (NOVOLOG) 100 UNIT/ML FlexPen Inject 15 Units into the skin daily.  08/28/16 08/28/17 Yes [provider]  Insulin Detemir (LEVEMIR FLEXTOUCH) 100 UNIT/ML Pen Inject 35 Units into the skin daily at 10 pm. 01/27/17  Yes Gouru, Aruna, MD  lidocaine (XYLOCAINE) 2 % solution Use as directed 10 mLs in the mouth or throat as needed for mouth pain. 02/20/17  Yes Lloyd Huger, MD  lovastatin (MEVACOR) 20 MG tablet Take 20 mg by mouth daily.  07/20/15  Yes [provider]  Nebivolol HCl 20 MG TABS Take 20 mg by mouth daily.   Yes [provider]  Pseudoeph-Doxylamine-DM-APAP (NYQUIL PO) Take by mouth as needed.   Yes [provider]  spironolactone (ALDACTONE) 100 MG tablet Take 1 tablet (100 mg total) by mouth daily. 03/04/17 05/04/17 Yes Jonathon Bellows, MD  tamsulosin (FLOMAX) 0.4 MG CAPS capsule Take 0.4 mg by mouth  every other day.  08/02/15  Yes [provider]  clotrimazole (MYCELEX) 10 MG troche DISSOLVE ONE T IN MOUTH FIVE TIMES PER DAY 03/28/17   [provider]  ONE TOUCH ULTRA TEST test strip  10/09/16   [provider]  polyethylene glycol (MIRALAX / GLYCOLAX) packet Take 17 g by mouth daily as needed for mild constipation. Patient not taking: Reported on 04/15/2017 01/27/17   Nicholes Mango, MD    Allergies as of 03/04/2017  . (No Known Allergies)    Family History  Problem Relation Age of Onset  . Ovarian cancer Mother   . Diabetes Father   . Hypertension Father     Social History   Socioeconomic History  . Marital status: Divorced    Spouse  name: Not on file  . Number of children: Not on file  . Years of education: Not on file  . Highest education level: Not on file  Occupational History  . Not on file  Social Needs  . Financial resource strain: Not on file  . Food insecurity:    Worry: Not on file    Inability: Not on file  . Transportation needs:    Medical: Not on file    Non-medical: Not on file  Tobacco Use  . Smoking status: Former Smoker    Packs/day: 1.00    Types: Cigarettes    Last attempt to quit: 04/15/1993    Years since quitting: 24.0  . Smokeless tobacco: Never Used  Substance and Sexual Activity  . Alcohol use: Yes    Alcohol/week: 25.2 oz    Types: 42 Cans of beer per week    Comment: 4-5 cans of beer a day  . Drug use: No  . Sexual activity: Yes  Lifestyle  . Physical activity:    Days per week: Not on file    Minutes per session: Not on file  . Stress: Not on file  Relationships  . Social connections:    Talks on phone: Not on file    Gets together: Not on file    Attends religious service: Not on file    Active member of club or organization: Not on file    Attends meetings of clubs or organizations: Not on file    Relationship status: Not on file  . Intimate partner violence:    Fear of current or ex partner: Not on file    Emotionally abused: Not on file    Physically abused: Not on file    Forced sexual activity: Not on file  Other Topics Concern  . Not on file  Social History Narrative  . Not on file    Review of Systems: See HPI, otherwise negative ROS  Physical Exam: BP 118/67   Pulse 79   Temp 97.7 F (36.5 C) (Tympanic)   Resp 18   Ht 5\' 11"  (1.803 m)   Wt 165 lb (74.8 kg)   SpO2 100%   BMI 23.01 kg/m  General:   Alert,  pleasant and cooperative in NAD Head:  Normocephalic and atraumatic. Neck:  Supple; no masses or thyromegaly. Lungs:  Clear throughout to auscultation, normal respiratory effort.    Heart:  +S1, +S2, Regular rate and rhythm, No  edema. Abdomen:  Soft, nontender and nondistended. Normal bowel sounds, without guarding, and without rebound.   Neurologic:  Alert and  oriented x4;  grossly normal neurologically.  Impression/Plan: Rodney Stewart is here for an endoscopy  to be performed for screening  of esophageal varices/     Risks, benefits, limitations, and alternatives regarding endoscopy have been reviewed with the patient.  Questions have been answered.  All parties agreeable.   Jonathon Bellows, MD  04/15/2017, 8:39 AM

## 2017-04-15 NOTE — Transfer of Care (Signed)
Immediate Anesthesia Transfer of Care Note  Patient: Rodney Stewart  Procedure(s) Performed: ESOPHAGOGASTRODUODENOSCOPY (EGD) WITH PROPOFOL (N/A )  Patient Location: PACU and Endoscopy Unit  Anesthesia Type:General  Level of Consciousness: drowsy and patient cooperative  Airway & Oxygen Therapy: Patient Spontanous Breathing and Patient connected to nasal cannula oxygen  Post-op Assessment: Report given to RN and Post -op Vital signs reviewed and stable  Post vital signs: Reviewed and stable  Last Vitals:  Vitals Value Taken Time  BP 113/70 04/15/2017  9:51 AM  Temp    Pulse 73 04/15/2017  9:58 AM  Resp 12 04/15/2017  9:58 AM  SpO2 99 % 04/15/2017  9:58 AM  Vitals shown include unvalidated device data.  Last Pain:  Vitals:   04/15/17 0941  TempSrc: Tympanic  PainSc: 0-No pain         Complications: No apparent anesthesia complications

## 2017-04-15 NOTE — Anesthesia Postprocedure Evaluation (Signed)
Anesthesia Post Note  Patient: Rodney Stewart  Procedure(s) Performed: ESOPHAGOGASTRODUODENOSCOPY (EGD) WITH PROPOFOL (N/A )  Patient location during evaluation: Endoscopy Anesthesia Type: General Level of consciousness: awake and alert Pain management: pain level controlled Vital Signs Assessment: post-procedure vital signs reviewed and stable Respiratory status: spontaneous breathing, nonlabored ventilation, respiratory function stable and patient connected to nasal cannula oxygen Cardiovascular status: blood pressure returned to baseline and stable Postop Assessment: no apparent nausea or vomiting Anesthetic complications: no     Last Vitals:  Vitals:   04/15/17 0814 04/15/17 0941  BP: 118/67 94/60  Pulse: 79   Resp: 18 20  Temp: 36.5 C (!) 36.1 C  SpO2: 100%     Last Pain:  Vitals:   04/15/17 1001  TempSrc:   PainSc: 0-No pain                 [name]

## 2017-04-15 NOTE — Anesthesia Preprocedure Evaluation (Signed)
Anesthesia Evaluation  Patient identified by MRN, date of birth, ID band Patient awake    Reviewed: Allergy & Precautions, NPO status , Patient's Chart, lab work & pertinent test results, reviewed documented beta blocker date and time   Airway Mallampati: II  TM Distance: >3 FB     Dental  (+) Chipped   Pulmonary shortness of breath, former smoker,           Cardiovascular hypertension, Pt. on medications      Neuro/Psych Anxiety    GI/Hepatic GERD  ,  Endo/Other  diabetes, Type 2  Renal/GU ARFRenal disease     Musculoskeletal   Abdominal   Peds  Hematology  (+) anemia ,   Anesthesia Other Findings Liver disease. Ascites. Fluid drained 2 weeks ago. ETOH.  Reproductive/Obstetrics                             Anesthesia Physical Anesthesia Plan  ASA: III  Anesthesia Plan: General   Post-op Pain Management:    Induction: Intravenous  PONV Risk Score and Plan:   Airway Management Planned:   Additional Equipment:   Intra-op Plan:   Post-operative Plan:   Informed Consent: I have reviewed the patients History and Physical, chart, labs and discussed the procedure including the risks, benefits and alternatives for the proposed anesthesia with the patient or authorized representative who has indicated his/her understanding and acceptance.     Plan Discussed with: CRNA  Anesthesia Plan Comments:         Anesthesia Quick Evaluation

## 2017-04-15 NOTE — Op Note (Signed)
[facility] Gastroenterology Patient Name: Rodney Stewart Procedure Date: 04/15/2017 9:17 AM MRN: [id number] Account #: [id number] Date of Birth: [birth year] Admit Type: Outpatient Age: 62 Room: [facility] ENDO ROOM 4 Gender: Male Note Status: Finalized Procedure:            Upper GI endoscopy Indications:          Cirrhosis rule out esophageal varices Providers:            Jonathon Bellows MD, MD Referring MD:         Kathlene November. Grayland Ormond, MD (Referring MD) Medicines:            Monitored Anesthesia Care Complications:        No immediate complications. Procedure:            Pre-Anesthesia Assessment:                       - Prior to the procedure, a History and Physical was                        performed, and patient medications, allergies and                        sensitivities were reviewed. The patient's tolerance of                        previous anesthesia was reviewed.                       - The risks and benefits of the procedure and the                        sedation options and risks were discussed with the                        patient. All questions were answered and informed                        consent was obtained.                       - ASA Grade Assessment: III - A patient with severe                        systemic disease.                       After obtaining informed consent, the endoscope was                        passed under direct vision. Throughout the procedure,                        the patient's blood pressure, pulse, and oxygen                        saturations were monitored continuously. The Endoscope                        was introduced through the mouth, and advanced to the  third part of duodenum. The upper GI endoscopy was                        accomplished with ease. The patient tolerated the                        procedure poorly due to the patient's respiratory                        instability  (hypoxia). Findings:      The examined duodenum was normal.      Moderate portal hypertensive gastropathy was found in the entire       examined stomach.      Food was found in the entire esophagus. Thin film of residue, wasiluy       washed down with some water. Darrick Grinder pieces Impression:           - Normal examined duodenum.                       - Portal hypertensive gastropathy.                       - Food in the esophagus.                       - No specimens collected. Recommendation:       - Discharge patient to home (with escort).                       - Resume previous diet.                       - Continue present medications.                       - Repeat upper endoscopy in 3 years for surveillance.                       - Return to GI office as previously scheduled. Procedure Code(s):    --- Professional ---                       [phone], Esophagogastroduodenoscopy, flexible, transoral;                        diagnostic, including collection of specimen(s) by                        brushing or washing, when performed (separate procedure) Diagnosis Code(s):    --- Professional ---                       K76.6, Portal hypertension                       K31.89, Other diseases of stomach and duodenum                       T18.128A, Food in esophagus causing other injury,                        initial encounter  K74.60, Unspecified cirrhosis of liver CPT copyright 2017 [employer]. All rights reserved. The codes documented in this report are preliminary and upon coder review may  be revised to meet current compliance requirements. Jonathon Bellows, MD Jonathon Bellows MD, MD 04/15/2017 9:36:36 AM This report has been signed electronically. Number of Addenda: 0 Note Initiated On: 04/15/2017 9:17 AM      [facility]

## 2017-04-16 ENCOUNTER — Telehealth: Payer: Self-pay | Admitting: Gastroenterology

## 2017-04-16 ENCOUNTER — Other Ambulatory Visit: Payer: Self-pay | Admitting: Oncology

## 2017-04-16 NOTE — Telephone Encounter (Signed)
Pt is calling to get refill on rx spiralactone send to [facility] [address]

## 2017-04-17 ENCOUNTER — Inpatient Hospital Stay: Payer: Medicare HMO

## 2017-04-17 ENCOUNTER — Inpatient Hospital Stay (HOSPITAL_BASED_OUTPATIENT_CLINIC_OR_DEPARTMENT_OTHER): Payer: Medicare HMO | Admitting: Oncology

## 2017-04-17 ENCOUNTER — Encounter: Payer: Self-pay | Admitting: Oncology

## 2017-04-17 ENCOUNTER — Other Ambulatory Visit: Payer: Self-pay

## 2017-04-17 VITALS — BP 121/71 | HR 72 | Temp 98.1°F | Resp 20 | Wt 162.8 lb

## 2017-04-17 DIAGNOSIS — C7802 Secondary malignant neoplasm of left lung: Principal | ICD-10-CM

## 2017-04-17 DIAGNOSIS — C642 Malignant neoplasm of left kidney, except renal pelvis: Secondary | ICD-10-CM

## 2017-04-17 DIAGNOSIS — I129 Hypertensive chronic kidney disease with stage 1 through stage 4 chronic kidney disease, or unspecified chronic kidney disease: Secondary | ICD-10-CM | POA: Diagnosis not present

## 2017-04-17 DIAGNOSIS — C649 Malignant neoplasm of unspecified kidney, except renal pelvis: Secondary | ICD-10-CM

## 2017-04-17 DIAGNOSIS — D61818 Other pancytopenia: Secondary | ICD-10-CM

## 2017-04-17 DIAGNOSIS — R188 Other ascites: Secondary | ICD-10-CM

## 2017-04-17 DIAGNOSIS — Z87891 Personal history of nicotine dependence: Secondary | ICD-10-CM | POA: Diagnosis not present

## 2017-04-17 DIAGNOSIS — C7801 Secondary malignant neoplasm of right lung: Secondary | ICD-10-CM | POA: Diagnosis not present

## 2017-04-17 DIAGNOSIS — Z905 Acquired absence of kidney: Secondary | ICD-10-CM

## 2017-04-17 DIAGNOSIS — D509 Iron deficiency anemia, unspecified: Secondary | ICD-10-CM

## 2017-04-17 DIAGNOSIS — K7460 Unspecified cirrhosis of liver: Secondary | ICD-10-CM | POA: Diagnosis not present

## 2017-04-17 DIAGNOSIS — Z5111 Encounter for antineoplastic chemotherapy: Secondary | ICD-10-CM | POA: Diagnosis not present

## 2017-04-17 DIAGNOSIS — Z79899 Other long term (current) drug therapy: Secondary | ICD-10-CM

## 2017-04-17 DIAGNOSIS — E871 Hypo-osmolality and hyponatremia: Secondary | ICD-10-CM | POA: Diagnosis not present

## 2017-04-17 DIAGNOSIS — D508 Other iron deficiency anemias: Secondary | ICD-10-CM

## 2017-04-17 DIAGNOSIS — N183 Chronic kidney disease, stage 3 (moderate): Secondary | ICD-10-CM | POA: Diagnosis not present

## 2017-04-17 LAB — CBC WITH DIFFERENTIAL/PLATELET
Basophils Absolute: 0 10*3/uL (ref 0–0.1)
Basophils Relative: 0 %
Eosinophils Absolute: 0.2 10*3/uL (ref 0–0.7)
Eosinophils Relative: 8 %
HEMATOCRIT: 28.1 % — AB (ref 40.0–52.0)
Hemoglobin: 8.8 g/dL — ABNORMAL LOW (ref 13.0–18.0)
LYMPHS ABS: 0.5 10*3/uL — AB (ref 1.0–3.6)
LYMPHS PCT: 20 %
MCH: 22.7 pg — AB (ref 26.0–34.0)
MCHC: 31.4 g/dL — AB (ref 32.0–36.0)
MCV: 72.4 fL — AB (ref 80.0–100.0)
MONO ABS: 0.4 10*3/uL (ref 0.2–1.0)
MONOS PCT: 15 %
NEUTROS ABS: 1.6 10*3/uL (ref 1.4–6.5)
Neutrophils Relative %: 57 %
Platelets: 151 10*3/uL (ref 150–440)
RBC: 3.88 MIL/uL — ABNORMAL LOW (ref 4.40–5.90)
RDW: 16.6 % — AB (ref 11.5–14.5)
WBC: 2.8 10*3/uL — ABNORMAL LOW (ref 3.8–10.6)

## 2017-04-17 LAB — COMPREHENSIVE METABOLIC PANEL
ALT: 18 U/L (ref 17–63)
AST: 32 U/L (ref 15–41)
Albumin: 3.2 g/dL — ABNORMAL LOW (ref 3.5–5.0)
Alkaline Phosphatase: 349 U/L — ABNORMAL HIGH (ref 38–126)
Anion gap: 10 (ref 5–15)
BILIRUBIN TOTAL: 1.6 mg/dL — AB (ref 0.3–1.2)
BUN: 16 mg/dL (ref 6–20)
CO2: 28 mmol/L (ref 22–32)
CREATININE: 1.31 mg/dL — AB (ref 0.61–1.24)
Calcium: 9 mg/dL (ref 8.9–10.3)
Chloride: 93 mmol/L — ABNORMAL LOW (ref 101–111)
GFR, EST NON AFRICAN AMERICAN: 58 mL/min — AB (ref >60)
Glucose, Bld: 97 mg/dL (ref 65–99)
POTASSIUM: 4 mmol/L (ref 3.5–5.1)
Sodium: 131 mmol/L — ABNORMAL LOW (ref 135–145)
TOTAL PROTEIN: 7.5 g/dL (ref 6.5–8.1)

## 2017-04-17 MED ORDER — SODIUM CHLORIDE 0.9 % IV SOLN
240.0000 mg | Freq: Once | INTRAVENOUS | Status: AC
  Administered 2017-04-17: 240 mg via INTRAVENOUS
  Filled 2017-04-17: qty 24

## 2017-04-17 MED ORDER — SODIUM CHLORIDE 0.9 % IV SOLN
Freq: Once | INTRAVENOUS | Status: AC
  Administered 2017-04-17: 12:00:00 via INTRAVENOUS
  Filled 2017-04-17: qty 1000

## 2017-04-17 MED ORDER — SODIUM CHLORIDE 0.9 % IV SOLN
Freq: Once | INTRAVENOUS | Status: AC
  Filled 2017-04-17: qty 1000

## 2017-04-17 MED ORDER — FERUMOXYTOL INJECTION 510 MG/17 ML
510.0000 mg | Freq: Once | INTRAVENOUS | Status: AC
  Administered 2017-04-17: 510 mg via INTRAVENOUS
  Filled 2017-04-17: qty 17

## 2017-04-17 NOTE — Telephone Encounter (Signed)
Advised patient that spironolactone refill was sent on 4/15 to [facility] on [address].

## 2017-04-17 NOTE — Progress Notes (Signed)
Patient denies any concerns today.  

## 2017-04-26 ENCOUNTER — Encounter: Payer: Self-pay | Admitting: Internal Medicine

## 2017-04-26 ENCOUNTER — Ambulatory Visit (INDEPENDENT_AMBULATORY_CARE_PROVIDER_SITE_OTHER): Payer: Medicare HMO | Admitting: Internal Medicine

## 2017-04-26 VITALS — BP 98/54 | HR 70 | Temp 98.5°F | Ht 71.0 in | Wt 169.0 lb

## 2017-04-26 DIAGNOSIS — D72819 Decreased white blood cell count, unspecified: Secondary | ICD-10-CM

## 2017-04-26 DIAGNOSIS — Z13220 Encounter for screening for lipoid disorders: Secondary | ICD-10-CM

## 2017-04-26 DIAGNOSIS — G8929 Other chronic pain: Secondary | ICD-10-CM

## 2017-04-26 DIAGNOSIS — M25562 Pain in left knee: Secondary | ICD-10-CM

## 2017-04-26 DIAGNOSIS — M1A9XX0 Chronic gout, unspecified, without tophus (tophi): Secondary | ICD-10-CM

## 2017-04-26 DIAGNOSIS — K635 Polyp of colon: Secondary | ICD-10-CM

## 2017-04-26 DIAGNOSIS — E46 Unspecified protein-calorie malnutrition: Secondary | ICD-10-CM | POA: Insufficient documentation

## 2017-04-26 DIAGNOSIS — K7460 Unspecified cirrhosis of liver: Secondary | ICD-10-CM | POA: Diagnosis not present

## 2017-04-26 DIAGNOSIS — E559 Vitamin D deficiency, unspecified: Secondary | ICD-10-CM | POA: Diagnosis not present

## 2017-04-26 DIAGNOSIS — Z125 Encounter for screening for malignant neoplasm of prostate: Secondary | ICD-10-CM | POA: Diagnosis not present

## 2017-04-26 DIAGNOSIS — E1142 Type 2 diabetes mellitus with diabetic polyneuropathy: Secondary | ICD-10-CM

## 2017-04-26 DIAGNOSIS — M25569 Pain in unspecified knee: Secondary | ICD-10-CM

## 2017-04-26 DIAGNOSIS — C649 Malignant neoplasm of unspecified kidney, except renal pelvis: Secondary | ICD-10-CM

## 2017-04-26 DIAGNOSIS — J9801 Acute bronchospasm: Secondary | ICD-10-CM

## 2017-04-26 DIAGNOSIS — R7989 Other specified abnormal findings of blood chemistry: Secondary | ICD-10-CM | POA: Diagnosis not present

## 2017-04-26 DIAGNOSIS — M109 Gout, unspecified: Secondary | ICD-10-CM

## 2017-04-26 DIAGNOSIS — R188 Other ascites: Secondary | ICD-10-CM

## 2017-04-26 DIAGNOSIS — K4090 Unilateral inguinal hernia, without obstruction or gangrene, not specified as recurrent: Secondary | ICD-10-CM

## 2017-04-26 DIAGNOSIS — I2510 Atherosclerotic heart disease of native coronary artery without angina pectoris: Secondary | ICD-10-CM

## 2017-04-26 DIAGNOSIS — K760 Fatty (change of) liver, not elsewhere classified: Secondary | ICD-10-CM

## 2017-04-26 DIAGNOSIS — K429 Umbilical hernia without obstruction or gangrene: Secondary | ICD-10-CM | POA: Diagnosis not present

## 2017-04-26 DIAGNOSIS — K8020 Calculus of gallbladder without cholecystitis without obstruction: Secondary | ICD-10-CM

## 2017-04-26 DIAGNOSIS — C7802 Secondary malignant neoplasm of left lung: Secondary | ICD-10-CM | POA: Diagnosis not present

## 2017-04-26 DIAGNOSIS — E441 Mild protein-calorie malnutrition: Secondary | ICD-10-CM

## 2017-04-26 DIAGNOSIS — M25561 Pain in right knee: Secondary | ICD-10-CM

## 2017-04-26 DIAGNOSIS — E1140 Type 2 diabetes mellitus with diabetic neuropathy, unspecified: Secondary | ICD-10-CM | POA: Insufficient documentation

## 2017-04-26 DIAGNOSIS — Z794 Long term (current) use of insulin: Secondary | ICD-10-CM

## 2017-04-26 MED ORDER — GABAPENTIN 100 MG PO CAPS
200.0000 mg | ORAL_CAPSULE | Freq: Three times a day (TID) | ORAL | 5 refills | Status: DC
Start: 2017-04-26 — End: 2018-01-21

## 2017-04-26 MED ORDER — ALBUTEROL SULFATE HFA 108 (90 BASE) MCG/ACT IN AERS: 1.0000 | INHALATION_SPRAY | Freq: Four times a day (QID) | RESPIRATORY_TRACT | 11 refills | Status: DC | PRN

## 2017-04-26 MED ORDER — INSULIN DETEMIR 100 UNIT/ML FLEXPEN: 35.0000 [IU] | PEN_INJECTOR | Freq: Every day | SUBCUTANEOUS | 11 refills | Status: DC

## 2017-04-26 MED ORDER — AMLODIPINE BESYLATE 5 MG PO TABS: 5.0000 mg | ORAL_TABLET | Freq: Every day | ORAL | 1 refills | Status: DC

## 2017-04-26 MED ORDER — INSULIN ASPART 100 UNIT/ML FLEXPEN: PEN_INJECTOR | SUBCUTANEOUS | 0 refills | Status: DC

## 2017-04-26 NOTE — Assessment & Plan Note (Addendum)
EGD Dr. Jonathon Bellows with moderate portal gastropathy  H/o ascites, splenomegaly

## 2017-04-26 NOTE — Progress Notes (Signed)
Pre visit review using our clinic review tool, if applicable. No additional management support is needed unless otherwise documented below in the visit note. 

## 2017-04-26 NOTE — Progress Notes (Signed)
Chief Complaint  Patient presents with  . New Patient (Initial Visit)   New patient with complicated medical history and upon extensive chart review  1. DM 2 A1C 10/19/16 was 7.7 he needs refill of levemir taking 35 units qhs and novolog was told to take 15 units but taking less. He has neuropathy and taking gabapentin 200 mg tid. He last saw eye MD fall 2018  2. HTN BP low normal today on norvasc 10 mg and lasix 40 mg qd and spironolactone 50 mg qd ,nevibolol 20 mg qd  3. Cirrhosis of liver, fatty liver, hep be Ag + following with Dr. Jonathon Bellows he had ascites noted on imaging 02/18/17 and had paracentesis and is on spironolactone 50 mg qd, lasix 40 mg qd. He had EGD recently with moderate portal gastropathy. He is a previously drinker more than he does now but will now only have occasional beer.  4. 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 Also with lung mets. He f/u with Dr. Delight Hoh  5. CKD 3 f/u with Dr. Holley Raring   Review of Systems  Constitutional: Negative for weight loss.  HENT: Negative for hearing loss.   Eyes: Negative for blurred vision.  Respiratory: Negative for cough and shortness of breath.   Cardiovascular: Negative for chest pain.  Gastrointestinal: Negative for abdominal pain.  Musculoskeletal: Positive for joint pain. Negative for falls.  Skin: Negative for rash.  Neurological: Positive for sensory change.  Psychiatric/Behavioral: Negative for depression.   Past Medical History:  Diagnosis Date  . Anemia   . BPH (benign prostatic hyperplasia)   . CKD (chronic kidney disease) stage 3, GFR 30-59 ml/min (HCC)   . Clear cell renal cell carcinoma (HCC)   . Colon polyps   . Diabetes mellitus without complication ([place])    type 2   . Dyspnea    with exertion  . GERD (gastroesophageal reflux disease)   . History of gout   . History of nephrectomy    Left  . Hypertension   .  Neuropathy   . Renal cell carcinoma of left kidney (HCC)    mets to lungs  . Umbilical hernia    Past Surgical History:  Procedure Laterality Date  . BACK SURGERY    . ENDOBRONCHIAL ULTRASOUND N/A 10/14/2015   Procedure: ENDOBRONCHIAL ULTRASOUND;  Surgeon: [name], MD;  Location: [facility] ORS;  Service: Pulmonary;  Laterality: N/A;  . ESOPHAGOGASTRODUODENOSCOPY (EGD) WITH PROPOFOL N/A 04/15/2017   Procedure: ESOPHAGOGASTRODUODENOSCOPY (EGD) WITH PROPOFOL;  Surgeon: Jonathon Bellows, MD;  Location: [facility] ENDOSCOPY;  Service: Gastroenterology;  Laterality: N/A;  Screen for esophageal varices  . EYE SURGERY Bilateral    Cataract Extraction with IOL  . KNEE SURGERY Right   . NEPHRECTOMY     Family History  Problem Relation Age of Onset  . Ovarian cancer Mother   . Diabetes Father   . Hypertension Father    Social History   Socioeconomic History  . Marital status: Divorced    Spouse name: Not on file  . Number of children: Not on file  . Years of education: Not on file  . Highest education level: Not on file  Occupational History  . Not on file  Social Needs  . Financial resource strain: Not on file  . Food insecurity:    Worry: Not on file    Inability: Not on file  . Transportation needs:  Medical: Not on file    Non-medical: Not on file  Tobacco Use  . Smoking status: Former Smoker    Packs/day: 1.00    Types: Cigarettes    Last attempt to quit: 04/15/1993    Years since quitting: 24.0  . Smokeless tobacco: Never Used  Substance and Sexual Activity  . Alcohol use: Yes    Alcohol/week: 25.2 oz    Types: 42 Cans of beer per week    Comment: 4-5 cans of beer a day  . Drug use: No  . Sexual activity: Yes  Lifestyle  . Physical activity:    Days per week: Not on file    Minutes per session: Not on file  . Stress: Not on file  Relationships  . Social connections:    Talks on phone: Not on file    Gets together: Not on file    Attends religious service: Not  on file    Active member of club or organization: Not on file    Attends meetings of clubs or organizations: Not on file    Relationship status: Not on file  . Intimate partner violence:    Fear of current or ex partner: Not on file    Emotionally abused: Not on file    Physically abused: Not on file    Forced sexual activity: Not on file  Other Topics Concern  . Not on file  Social History Narrative   HS graduate    Retired    Current Meds  Medication Sig  . acetaminophen (TYLENOL) 325 MG tablet Take 325 mg by mouth every 6 (six) hours as needed.  . ALPRAZolam (XANAX) 1 MG tablet Take 1 mg by mouth as needed for sleep.  Marland Kitchen amLODipine (NORVASC) 5 MG tablet Take 1 tablet (5 mg total) by mouth daily.  Marland Kitchen aspirin 81 MG chewable tablet Chew 81 mg by mouth daily.  . colchicine 0.6 MG tablet Take 0.6 mg by mouth every other day.   . fluticasone (FLONASE) 50 MCG/ACT nasal spray   . furosemide (LASIX) 40 MG tablet Take 1 tablet (40 mg total) by mouth daily.  Marland Kitchen gabapentin (NEURONTIN) 100 MG capsule Take 200 mg by mouth 3 (three) times daily.   . insulin aspart (NOVOLOG) 100 UNIT/ML FlexPen Inject 15 Units into the skin daily.   . Insulin Detemir (LEVEMIR FLEXTOUCH) 100 UNIT/ML Pen Inject 35 Units into the skin daily at 10 pm.  . lovastatin (MEVACOR) 20 MG tablet Take 20 mg by mouth daily.   . Nebivolol HCl 20 MG TABS Take 20 mg by mouth daily.  . ONE TOUCH ULTRA TEST test strip   . Pseudoeph-Doxylamine-DM-APAP (NYQUIL PO) Take by mouth as needed.  Marland Kitchen spironolactone (ALDACTONE) 50 MG tablet TAKE 1 TABLET BY MOUTH ONCE DAILY  . tamsulosin (FLOMAX) 0.4 MG CAPS capsule Take 0.4 mg by mouth every other day.   . [DISCONTINUED] amLODipine (NORVASC) 10 MG tablet Take 10 mg by mouth daily.   . [DISCONTINUED] Insulin Detemir (LEVEMIR FLEXTOUCH) 100 UNIT/ML Pen Inject 35 Units into the skin daily at 10 pm.   Current Facility-Administered Medications for the 04/26/17 encounter (Office Visit) with  [name], [name], MD  Medication  . albumin human 25 % solution 12.5 g  . albumin human 25 % solution 12.5 g  . albumin human 5 % solution 25 g   No Known Allergies Recent Results (from the past 2160 hour(s))  Glucose, capillary     Status: Abnormal   Collection  Time: 01/26/17  7:42 PM  Result Value Ref Range   Glucose-Capillary 157 (H) 65 - 99 mg/dL  CBC with Differential/Platelet     Status: Abnormal   Collection Time: 01/27/17  3:27 AM  Result Value Ref Range   WBC 1.4 (LL) 3.8 - 10.6 K/uL    Comment: CRITICAL VALUE NOTED.  VALUE IS CONSISTENT WITH PREVIOUSLY REPORTED AND CALLED VALUE.   RBC 3.59 (L) 4.40 - 5.90 MIL/uL   Hemoglobin 8.1 (L) 13.0 - 18.0 g/dL    Comment: RESULT REPEATED AND VERIFIED   HCT 27.3 (L) 40.0 - 52.0 %   MCV 76.1 (L) 80.0 - 100.0 fL   MCH 22.4 (L) 26.0 - 34.0 pg   MCHC 29.5 (L) 32.0 - 36.0 g/dL   RDW 21.2 (H) 11.5 - 14.5 %   Platelets 91 (L) 150 - 440 K/uL   Neutrophils Relative % 37 %   Neutro Abs 0.5 (L) 1.4 - 6.5 K/uL   Lymphocytes Relative 34 %   Lymphs Abs 0.5 (L) 1.0 - 3.6 K/uL   Monocytes Relative 13 %   Monocytes Absolute 0.2 0.2 - 1.0 K/uL   Eosinophils Relative 15 %   Eosinophils Absolute 0.2 0 - 0.7 K/uL   Basophils Relative 1 %   Basophils Absolute 0.0 0 - 0.1 K/uL    Comment: Performed at [facility], [address] [place], [place] [address]  Glucose, capillary     Status: Abnormal   Collection Time: 01/27/17  7:58 AM  Result Value Ref Range   Glucose-Capillary 56 (L) 65 - 99 mg/dL  Glucose, capillary     Status: None   Collection Time: 01/27/17  8:37 AM  Result Value Ref Range   Glucose-Capillary 85 65 - 99 mg/dL  Glucose, capillary     Status: Abnormal   Collection Time: 01/27/17 12:00 PM  Result Value Ref Range   Glucose-Capillary 144 (H) 65 - 99 mg/dL  CBC with Differential/Platelet     Status: Abnormal   Collection Time: 02/06/17  1:22 PM  Result Value Ref Range   WBC 2.5 (L) 3.8 - 10.6 K/uL   RBC  4.09 (L) 4.40 - 5.90 MIL/uL   Hemoglobin 9.9 (L) 13.0 - 18.0 g/dL   HCT 32.2 (L) 40.0 - 52.0 %   MCV 78.7 (L) 80.0 - 100.0 fL   MCH 24.3 (L) 26.0 - 34.0 pg   MCHC 30.9 (L) 32.0 - 36.0 g/dL   RDW 23.7 (H) 11.5 - 14.5 %   Platelets 154 150 - 440 K/uL   Neutrophils Relative % 64 %   Neutro Abs 1.6 1.4 - 6.5 K/uL   Lymphocytes Relative 18 %   Lymphs Abs 0.4 (L) 1.0 - 3.6 K/uL   Monocytes Relative 12 %   Monocytes Absolute 0.3 0.2 - 1.0 K/uL   Eosinophils Relative 5 %   Eosinophils Absolute 0.1 0 - 0.7 K/uL   Basophils Relative 1 %   Basophils Absolute 0.0 0 - 0.1 K/uL    Comment: Performed at [facility], [address] [place], [place] [address]  Comprehensive metabolic panel     Status: Abnormal   Collection Time: 02/06/17  1:22 PM  Result Value Ref Range   Sodium 135 135 - 145 mmol/L   Potassium 4.7 3.5 - 5.1 mmol/L   Chloride 104 101 - 111 mmol/L   CO2 24 22 - 32 mmol/L   Glucose, Bld 155 (H) 65 - 99 mg/dL   BUN 19 6 -  20 mg/dL   Creatinine, Ser 1.41 (H) 0.61 - 1.24 mg/dL   Calcium 8.3 (L) 8.9 - 10.3 mg/dL   Total Protein 6.7 6.5 - 8.1 g/dL   Albumin 2.8 (L) 3.5 - 5.0 g/dL   AST 43 (H) 15 - 41 U/L   ALT 14 (L) 17 - 63 U/L   Alkaline Phosphatase 288 (H) 38 - 126 U/L   Total Bilirubin 1.7 (H) 0.3 - 1.2 mg/dL   GFR calc non Af Amer 53 (L) >60 mL/min   GFR calc Af Amer >60 >60 mL/min    Comment: (NOTE) The eGFR has been calculated using the CKD EPI equation. This calculation has not been validated in all clinical situations. eGFR's persistently <60 mL/min signify possible Chronic Kidney Disease.    Anion gap 7 5 - 15    Comment: Performed at [facility], [address] [place], [place] [address]  Basic metabolic panel     Status: Abnormal   Collection Time: 02/08/17  1:35 PM  Result Value Ref Range   Sodium 135 135 - 145 mmol/L   Potassium 4.1 3.5 - 5.1 mmol/L   Chloride 104 101 - 111 mmol/L   CO2 24 22 - 32 mmol/L   Glucose, Bld 67 65 - 99 mg/dL    BUN 16 6 - 20 mg/dL   Creatinine, Ser 1.47 (H) 0.61 - 1.24 mg/dL   Calcium 8.5 (L) 8.9 - 10.3 mg/dL   GFR calc non Af Amer 50 (L) >60 mL/min   GFR calc Af Amer 58 (L) >60 mL/min    Comment: (NOTE) The eGFR has been calculated using the CKD EPI equation. This calculation has not been validated in all clinical situations. eGFR's persistently <60 mL/min signify possible Chronic Kidney Disease.    Anion gap 7 5 - 15    Comment: Performed at [facility], [address] [place], [place] [address]  CBC     Status: Abnormal   Collection Time: 02/08/17  1:35 PM  Result Value Ref Range   WBC 2.2 (L) 3.8 - 10.6 K/uL   RBC 4.25 (L) 4.40 - 5.90 MIL/uL   Hemoglobin 10.5 (L) 13.0 - 18.0 g/dL   HCT 33.6 (L) 40.0 - 52.0 %   MCV 79.1 (L) 80.0 - 100.0 fL   MCH 24.7 (L) 26.0 - 34.0 pg   MCHC 31.2 (L) 32.0 - 36.0 g/dL   RDW 23.5 (H) 11.5 - 14.5 %   Platelets 154 150 - 440 K/uL    Comment: Performed at [facility], [address] [place], [place] [address]  Troponin I     Status: None   Collection Time: 02/08/17  1:35 PM  Result Value Ref Range   Troponin I <0.03 <0.03 ng/mL    Comment: Performed at [facility], [address] [place], [place] [address]  Hepatitis A antibody, total     Status: Abnormal   Collection Time: 02/19/17  3:22 PM  Result Value Ref Range   Hep A Total Ab Positive (A) Negative    Comment: (NOTE) Performed At: [facility] [place], [place] [id number] Rush Farmer MD [id number] Performed at [facility], [address] [place], [place] [address]   Hepatitis B surface antibody     Status: Abnormal   Collection Time: 02/19/17  3:22 PM  Result Value Ref Range   Hepatitis B-Post <3.1 (L) Immunity>9.9 mIU/mL    Comment: (NOTE)  Status of Immunity  Anti-HBs Level  ------------------                     -------------- Inconsistent with Immunity                   0.0 -  9.9 Consistent with Immunity                          >9.9 Performed At: [facility] [place], [place] [id number] Rush Farmer MD [id number] Performed at [facility], [address] [place], [place] [address]   Hepatitis C antibody     Status: None   Collection Time: 02/19/17  3:22 PM  Result Value Ref Range   HCV Ab <0.1 0.0 - 0.9 s/co ratio    Comment: (NOTE)                                  Negative:     < 0.8                             Indeterminate: 0.8 - 0.9                                  Positive:     > 0.9 The CDC recommends that a positive HCV antibody result be followed up with a HCV Nucleic Acid Amplification test (469629). Performed At: [facility] [place], [place] [id number] Rush Farmer MD [id number] Performed at [facility], [address] [place], [place] [address]   Hepatitis B E antigen     Status: Abnormal   Collection Time: 02/19/17  3:22 PM  Result Value Ref Range   Hep B E Ag Positive (A) Negative    Comment: (NOTE) Performed At: [facility] [place], [place] [id number] Rush Farmer MD [id number] Performed at [facility], [address] [place], [place] [address]   Hepatitis B core antibody, total     Status: None   Collection Time: 02/19/17  3:22 PM  Result Value Ref Range   Hep B Core Total Ab Negative Negative    Comment: (NOTE) Performed At: [facility] [place], [place] [id number] Rush Farmer MD [phone] Performed at [facility], [place], [place] [address]   Rapid HIV screen (HIV 1/2 Ab+Ag)     Status: None   Collection Time: 02/19/17  3:22 PM  Result Value Ref Range   HIV-1 P24 Antigen - HIV24 NON REACTIVE NON REACTIVE   HIV 1/2 Antibodies NON REACTIVE NON REACTIVE   Interpretation (HIV Ag Ab)      A non reactive test result means that HIV 1 or HIV 2  antibodies and HIV 1 p24 antigen were not detected in the specimen.    Comment: Performed at [facility], [address] [place], [place] [address]  PTH, intact and calcium     Status: Abnormal   Collection Time: 02/19/17  3:22 PM  Result Value Ref Range   PTH 43 15 - 65 pg/mL   Calcium, Total (PTH) 8.2 (L) 8.6 - 10.2 mg/dL   PTH Interp Comment     Comment: (NOTE) Interpretation  Intact PTH    Calcium                                (pg/mL)      (mg/dL) Normal                          15 - 65     8.6 - 10.2 Primary Hyperparathyroidism         >65          >10.2 Secondary Hyperparathyroidism       >65          <10.2 Non-Parathyroid Hypercalcemia       <65          >10.2 Hypoparathyroidism                  <15          < 8.6 Non-Parathyroid Hypocalcemia    15 - 65          < 8.6 Performed At: [facility] [place], [place] [id number] Rush Farmer MD [id number] Performed at [facility], [address] [place], [place] [address]   Mitochondrial antibodies     Status: None   Collection Time: 02/19/17  3:22 PM  Result Value Ref Range   Mitochondrial M2 Ab, IgG <20.0 0.0 - 20.0 Units    Comment: (NOTE)                                Negative    0.0 - 20.0                                Equivocal  20.1 - 24.9                                Positive         >24.9 Mitochondrial (M2) Antibodies are found in 90-96% of patients with primary biliary cirrhosis. Performed At: [facility] [place], [place] [id number] Rush Farmer MD [id number] Performed at [facility], [address] [place], [place] [address]   Anti-smooth muscle antibody, IgG     Status: None   Collection Time: 02/19/17  3:22 PM  Result Value Ref Range   F-Actin IgG 16 0 - 19 Units    Comment: (NOTE)                 Negative                     0 - 19                 Weak positive               20 - 30                  Moderate to strong positive     >30 Actin Antibodies are found in 52-85% of patients with autoimmune hepatitis or chronic active hepatitis and in 22% of patients with primary biliary cirrhosis. Performed At: [facility] [place], [place] [id number] Rush Farmer MD [id number] Performed at [facility], [address] [place], [place] [address]  Ceruloplasmin     Status: None   Collection Time: 02/19/17  3:22 PM  Result Value Ref Range   Ceruloplasmin 26.3 16.0 - 31.0 mg/dL    Comment: (NOTE) Performed At: [facility] [place], [place] [id number] Rush Farmer MD [id number] Performed at [facility], [address] [place], [place] [address]   Alpha-1 antitrypsin phenotype     Status: None   Collection Time: 02/19/17  3:22 PM  Result Value Ref Range   A-1 Antitrypsin Pheno MM     Comment: (NOTE)       Phenotype   Population      A-1-AT Concentration                   Incidence %      Reference Interval       MM            86.5%                96 - 189       MS             8.0%                83 - 161       MZ             3.9%                60 - 111       FM             0.4%                93 - 191       SZ             0.3%                42 -  75       SS             0.1%                62 - 119       ZZ             0.05%               16 -  38       FS             0.05%               70 - 128       FZ            Unknown              44 -  88       FF            Unknown              Unknown Performed At: [facility] [place], [place] [id number] Rush Farmer MD [id number]    A-1 Antitrypsin, Ser 179 90 - 200 mg/dL    Comment: Performed at [facility], [address] [place], [place] [address]  TSH     Status: Abnormal   Collection Time: 02/19/17  3:22 PM  Result Value Ref Range   TSH 10.244 (H) 0.350 - 4.500 uIU/mL    Comment: Performed by a 3rd  Generation assay with a functional sensitivity of <=  0.01 uIU/mL. Performed at [facility], [address] [place], [place] [address]   CBC with Differential/Platelet     Status: Abnormal   Collection Time: 02/20/17 10:00 AM  Result Value Ref Range   WBC 2.7 (L) 3.8 - 10.6 K/uL   RBC 4.13 (L) 4.40 - 5.90 MIL/uL   Hemoglobin 10.8 (L) 13.0 - 18.0 g/dL   HCT 33.7 (L) 40.0 - 52.0 %   MCV 81.6 80.0 - 100.0 fL   MCH 26.1 26.0 - 34.0 pg   MCHC 31.9 (L) 32.0 - 36.0 g/dL   RDW 23.2 (H) 11.5 - 14.5 %   Platelets 122 (L) 150 - 440 K/uL   Neutrophils Relative % 47 %   Neutro Abs 1.3 (L) 1.4 - 6.5 K/uL   Lymphocytes Relative 29 %   Lymphs Abs 0.8 (L) 1.0 - 3.6 K/uL   Monocytes Relative 12 %   Monocytes Absolute 0.3 0.2 - 1.0 K/uL   Eosinophils Relative 11 %   Eosinophils Absolute 0.3 0 - 0.7 K/uL   Basophils Relative 1 %   Basophils Absolute 0.0 0 - 0.1 K/uL    Comment: Performed at [facility], [address] [place], [place] [address]  Comprehensive metabolic panel     Status: Abnormal   Collection Time: 02/20/17 10:00 AM  Result Value Ref Range   Sodium 135 135 - 145 mmol/L   Potassium 4.9 3.5 - 5.1 mmol/L   Chloride 104 101 - 111 mmol/L   CO2 28 22 - 32 mmol/L   Glucose, Bld 53 (L) 65 - 99 mg/dL   BUN 17 6 - 20 mg/dL   Creatinine, Ser 1.37 (H) 0.61 - 1.24 mg/dL   Calcium 8.5 (L) 8.9 - 10.3 mg/dL   Total Protein 6.9 6.5 - 8.1 g/dL   Albumin 2.9 (L) 3.5 - 5.0 g/dL   AST 39 15 - 41 U/L   ALT 16 (L) 17 - 63 U/L   Alkaline Phosphatase 293 (H) 38 - 126 U/L   Total Bilirubin 1.0 0.3 - 1.2 mg/dL   GFR calc non Af Amer 55 (L) >60 mL/min   GFR calc Af Amer >60 >60 mL/min    Comment: (NOTE) The eGFR has been calculated using the CKD EPI equation. This calculation has not been validated in all clinical situations. eGFR's persistently <60 mL/min signify possible Chronic Kidney Disease.    Anion gap 3 (L) 5 - 15    Comment: Performed at [facility], [place], [place] [address]  Lactate dehydrogenase (pleural or peritoneal fluid)     Status: Abnormal   Collection Time: 03/01/17  8:44 AM  Result Value Ref Range   LD, Fluid 28 (H) 3 - 23 U/L    Comment: (NOTE) Results should be evaluated in conjunction with serum values    Fluid Type-FLDH PERITONEAL     Comment: Performed at [facility], [address] [place], [place] [address] CORRECTED ON 03/01 AT 5277: PREVIOUSLY REPORTED AS CYTOPERI   Body fluid cell count with differential     Status: Abnormal   Collection Time: 03/01/17  8:44 AM  Result Value Ref Range   Fluid Type-FCT PERITONEAL     Comment: CORRECTED ON 03/01 AT 0933: PREVIOUSLY REPORTED AS CYTOPERI   Color, Fluid YELLOW YELLOW   Appearance, Fluid CLEAR (A) CLEAR   WBC, Fluid 151 cu mm   Neutrophil Count, Fluid 1 %   Lymphs, Fluid 53 %   Monocyte-Macrophage-Serous  Fluid 46 %   Eos, Fluid 0 %   Other Cells, Fluid 0 %    Comment: Performed at [facility], [address] [place], [place] [address]  Body fluid culture     Status: None   Collection Time: 03/01/17  8:44 AM  Result Value Ref Range   Specimen Description      PERITONEAL Performed at [facility], [address] [place], [place] [address]    Special Requests      NONE Performed at [facility], [place], [place] [address]    Gram Stain      RARE WBC PRESENT,BOTH PMN AND MONONUCLEAR NO ORGANISMS SEEN    Culture      NO GROWTH 3 DAYS Performed at [facility], [place] [address], [place], [place] [address]    Report Status 03/04/2017 FINAL   Albumin, pleural or peritoneal fluid     Status: None   Collection Time: 03/01/17  8:44 AM  Result Value Ref Range   Albumin, Fluid <1.0 g/dL    Comment: (NOTE) No normal range established for this test Results should be evaluated in conjunction with serum values    Fluid Type-FALB PERITONEAL     Comment: Performed at [facility], [address] [place], [place] [address] CORRECTED ON 03/01 AT 2620: PREVIOUSLY REPORTED AS CYTOPERI   Protein, pleural or peritoneal fluid     Status: None   Collection Time: 03/01/17  8:44 AM  Result Value Ref Range   Total protein, fluid <3.0 g/dL    Comment: (NOTE) No normal range established for this test Results should be evaluated in conjunction with serum values    Fluid Type-FTP PERITONEAL     Comment: Performed at [facility], [address] [place], [place] [address] CORRECTED ON 03/01 AT 4163: PREVIOUSLY REPORTED AS CYTOPERI   Lipase, Fluid     Status: None   Collection Time: 03/01/17  8:44 AM  Result Value Ref Range   Lipase-Fluid 30 U/L    Comment: (NOTE) INTERPRETIVE INFORMATION: Lipase, Fluid For information on body fluid reference ranges and/or interpretive guidance visit [email or web address] Test developed and characteristics determined by [place]. See Compliance Statement B: [email or web address] Performed At: [facility] [address] [place], [place] [id number] Esau Grew MD [id number]    Source of Sample PERITONEAL     Comment: Performed at [facility], [address] [place], [place] [address]  Pathologist smear review     Status: None   Collection Time: 03/01/17  8:44 AM  Result Value Ref Range   Path Review Cytospin of paracentesis fluid reviewed.     Comment: Negative for malignancy,. Absolute neutrophil count 2 cells per cu mm Reviewed by Dellia Nims. Reuel Derby, M.D. Performed at [facility], [address] [place], [place] [address]   Protein, body fluid (other)     Status: None   Collection Time: 03/01/17  8:44 AM  Result Value Ref Range   Total Protein, Body Fluid Other 1.4 g/dL    Comment: (NOTE) ________________________________________________________ :  Peritoneal  :       Pleural          :   Synovial      : :______________:________________________:________________: :              : Transudate :  Exudate  :                : :  ______________:____________:___________:________________: :  Not Estab.  :   <3 g/dL  :  >3 g/dL  :    <2.5 g/dL   : :______________:____________:___________:________________: The method performance specifications have not been established for this test in body fluid. The test result should be integrated into the clinical context for interpretation. The method performance specifications have not been established for this test in body fluid.  The test result should be integrated into the clinical context for interpretation. Performed At: [facility] [place], [place] [id number] Rush Farmer MD [id number]    Source of Sample PERITONEAL     Comment: Performed at [facility], [address] [place], [place] [address]  Comprehensive metabolic panel     Status: Abnormal   Collection Time: 03/01/17  9:48 AM  Result Value Ref Range   Sodium 133 (L) 135 - 145 mmol/L   Potassium 4.0 3.5 - 5.1 mmol/L   Chloride 98 (L) 101 - 111 mmol/L   CO2 28 22 - 32 mmol/L   Glucose, Bld 80 65 - 99 mg/dL   BUN 18 6 - 20 mg/dL   Creatinine, Ser 1.33 (H) 0.61 - 1.24 mg/dL   Calcium 8.6 (L) 8.9 - 10.3 mg/dL   Total Protein 7.4 6.5 - 8.1 g/dL   Albumin 3.5 3.5 - 5.0 g/dL   AST 31 15 - 41 U/L   ALT 16 (L) 17 - 63 U/L   Alkaline Phosphatase 292 (H) 38 - 126 U/L   Total Bilirubin 1.1 0.3 - 1.2 mg/dL   GFR calc non Af Amer 57 (L) >60 mL/min   GFR calc Af Amer >60 >60 mL/min    Comment: (NOTE) The eGFR has been calculated using the CKD EPI equation. This calculation has not been validated in all clinical situations. eGFR's persistently <60 mL/min signify possible Chronic Kidney Disease.    Anion gap 7 5 - 15    Comment: Performed at [facility], [address] [place], [place] [address]  CBC with Differential/Platelet     Status:  Abnormal   Collection Time: 03/01/17  9:48 AM  Result Value Ref Range   WBC 1.8 (L) 3.8 - 10.6 K/uL   RBC 3.62 (L) 4.40 - 5.90 MIL/uL   Hemoglobin 9.6 (L) 13.0 - 18.0 g/dL   HCT 29.7 (L) 40.0 - 52.0 %   MCV 82.0 80.0 - 100.0 fL   MCH 26.4 26.0 - 34.0 pg   MCHC 32.2 32.0 - 36.0 g/dL   RDW 21.3 (H) 11.5 - 14.5 %   Platelets 133 (L) 150 - 440 K/uL   Neutrophils Relative % 54 %   Neutro Abs 1.0 (L) 1.4 - 6.5 K/uL   Lymphocytes Relative 23 %   Lymphs Abs 0.4 (L) 1.0 - 3.6 K/uL   Monocytes Relative 14 %   Monocytes Absolute 0.2 0.2 - 1.0 K/uL   Eosinophils Relative 8 %   Eosinophils Absolute 0.1 0 - 0.7 K/uL   Basophils Relative 1 %   Basophils Absolute 0.0 0 - 0.1 K/uL    Comment: Performed at [facility], [address] [place], [place] [address]  Comprehensive metabolic panel     Status: Abnormal   Collection Time: 03/06/17  1:35 PM  Result Value Ref Range   Sodium 131 (L) 135 - 145 mmol/L   Potassium 5.1 3.5 - 5.1 mmol/L   Chloride 97 (L) 101 - 111 mmol/L   CO2 27 22 - 32 mmol/L   Glucose,  Bld 203 (H) 65 - 99 mg/dL   BUN 31 (H) 6 - 20 mg/dL   Creatinine, Ser 1.41 (H) 0.61 - 1.24 mg/dL   Calcium 8.5 (L) 8.9 - 10.3 mg/dL   Total Protein 6.5 6.5 - 8.1 g/dL   Albumin 2.8 (L) 3.5 - 5.0 g/dL   AST 35 15 - 41 U/L   ALT 17 17 - 63 U/L   Alkaline Phosphatase 352 (H) 38 - 126 U/L   Total Bilirubin 0.9 0.3 - 1.2 mg/dL   GFR calc non Af Amer 53 (L) >60 mL/min   GFR calc Af Amer >60 >60 mL/min    Comment: (NOTE) The eGFR has been calculated using the CKD EPI equation. This calculation has not been validated in all clinical situations. eGFR's persistently <60 mL/min signify possible Chronic Kidney Disease.    Anion gap 7 5 - 15    Comment: Performed at [facility], [address] [place], [place] [address]  CBC with Differential/Platelet     Status: Abnormal   Collection Time: 03/06/17  1:35 PM  Result Value Ref Range   WBC 2.4 (L) 3.8 - 10.6 K/uL   RBC 3.18  (L) 4.40 - 5.90 MIL/uL   Hemoglobin 8.4 (L) 13.0 - 18.0 g/dL   HCT 25.8 (L) 40.0 - 52.0 %   MCV 81.1 80.0 - 100.0 fL   MCH 26.4 26.0 - 34.0 pg   MCHC 32.6 32.0 - 36.0 g/dL   RDW 19.0 (H) 11.5 - 14.5 %   Platelets 149 (L) 150 - 440 K/uL   Neutrophils Relative % 53 %   Neutro Abs 1.3 (L) 1.4 - 6.5 K/uL   Lymphocytes Relative 23 %   Lymphs Abs 0.5 (L) 1.0 - 3.6 K/uL   Monocytes Relative 15 %   Monocytes Absolute 0.4 0.2 - 1.0 K/uL   Eosinophils Relative 8 %   Eosinophils Absolute 0.2 0 - 0.7 K/uL   Basophils Relative 1 %   Basophils Absolute 0.0 0 - 0.1 K/uL    Comment: Performed at [facility], [address] [place], [place] [address]  Alkaline phosphatase, isoenzymes     Status: Abnormal   Collection Time: 03/11/17  3:15 PM  Result Value Ref Range   Alk Phos 360 (H) 39 - 117 IU/L   Alk Phos Bone Fract 31 12 - 68 %   Alk Phos Liver Fract 68 13 - 88 %   Intestinal % 1 0 - 18 %    Comment: (NOTE) Performed At: [facility] [place] [facility], [place] [id number] Rush Farmer MD [phone] Performed at [facility], [address] [place], [place] [address]   Hepatitis B DNA, ultraquantitative, PCR     Status: None   Collection Time: 03/11/17  3:21 PM  Result Value Ref Range   HBV DNA SERPL PCR-ACNC HBV DNA not detected IU/mL   HBV DNA SERPL PCR-LOG IU UNABLE TO CALCULATE log10 IU/mL    Comment: (NOTE) Unable to calculate result since non-numeric result obtained for component test.    Test Info: Comment     Comment: (NOTE) The reportable range for this assay is 10 IU/mL to 1 billion IU/mL. Performed At: [facility] [place], [place] [id number] Rush Farmer MD QQ:[id number] Performed at [facility], [address] [place], [place] [address]   Basic metabolic panel     Status: Abnormal   Collection Time: 03/11/17  3:21 PM  Result Value Ref Range  Sodium 128 (L) 135 - 145 mmol/L   Potassium 4.3  3.5 - 5.1 mmol/L   Chloride 92 (L) 101 - 111 mmol/L   CO2 27 22 - 32 mmol/L   Glucose, Bld 317 (H) 65 - 99 mg/dL   BUN 26 (H) 6 - 20 mg/dL   Creatinine, Ser 1.44 (H) 0.61 - 1.24 mg/dL   Calcium 8.1 (L) 8.9 - 10.3 mg/dL   GFR calc non Af Amer 51 (L) >60 mL/min   GFR calc Af Amer 60 (L) >60 mL/min    Comment: (NOTE) The eGFR has been calculated using the CKD EPI equation. This calculation has not been validated in all clinical situations. eGFR's persistently <60 mL/min signify possible Chronic Kidney Disease.    Anion gap 9 5 - 15    Comment: Performed at [facility], [address] [place], [place] [address]  Miscellaneous [facility] test (send-out)     Status: None   Collection Time: 03/11/17  3:21 PM  Result Value Ref Range   [facility] test code [id number]    [facility] test name HEPATITIS DELTA VIRUS ANTIGEN    Misc [facility] result COMMENT     Comment: (NOTE) Test Ordered: [id number] Hepatitis D Antigen Hepatitis D Antigen            Positive    Linda.Low ]          E=     Reference Range: Negative                              Interpretation: Negative:     Antigen not detected Equivocal:     Antigen may or may not be present Positive:     Antigen detected Repeat testing is recommended for equivocal results with a sample collected 7-14 days after initial sample collection. The performance characteristics of the listed assay were validated by [employer] The [place] FDA has not approved or cleared this test. The results of this assay can be used for clinical diagnosis without FDA approval. [facility] is a [employer] representative, CAP accredited laboratory for performing high complexity assays such as this one. Testing Performed at: [facility] [address] [place] [address] Performed At: [facility] [address] [place], [place] [id number] Rush Farmer MD [id number] Performe d At: [facility] [place], [place] [id number] Tinghitella Gwen Her PhD VO:[id number] Performed at [facility], [address] [place], [place] [address]   Comprehensive metabolic panel     Status: Abnormal   Collection Time: 03/20/17 10:05 AM  Result Value Ref Range   Sodium 129 (L) 135 - 145 mmol/L   Potassium 4.0 3.5 - 5.1 mmol/L   Chloride 92 (L) 101 - 111 mmol/L   CO2 29 22 - 32 mmol/L   Glucose, Bld 97 65 - 99 mg/dL   BUN 28 (H) 6 - 20 mg/dL   Creatinine, Ser 1.35 (H) 0.61 - 1.24 mg/dL   Calcium 8.6 (L) 8.9 - 10.3 mg/dL   Total Protein 6.9 6.5 - 8.1 g/dL   Albumin 2.8 (L) 3.5 - 5.0 g/dL   AST 37 15 - 41 U/L   ALT 17 17 - 63 U/L   Alkaline Phosphatase 341 (H) 38 - 126 U/L   Total Bilirubin 1.5 (H) 0.3 - 1.2 mg/dL   GFR calc non Af Amer 56 (L) >60 mL/min   GFR calc Af Amer >  60 >60 mL/min    Comment: (NOTE) The eGFR has been calculated using the CKD EPI equation. This calculation has not been validated in all clinical situations. eGFR's persistently <60 mL/min signify possible Chronic Kidney Disease.    Anion gap 8 5 - 15    Comment: Performed at [facility], [address] [place], [place] [address]  CBC with Differential/Platelet     Status: Abnormal   Collection Time: 03/20/17 10:05 AM  Result Value Ref Range   WBC 2.5 (L) 3.8 - 10.6 K/uL   RBC 3.27 (L) 4.40 - 5.90 MIL/uL   Hemoglobin 8.1 (L) 13.0 - 18.0 g/dL   HCT 25.3 (L) 40.0 - 52.0 %   MCV 77.2 (L) 80.0 - 100.0 fL   MCH 24.7 (L) 26.0 - 34.0 pg   MCHC 32.0 32.0 - 36.0 g/dL   RDW 17.6 (H) 11.5 - 14.5 %   Platelets 114 (L) 150 - 440 K/uL   Neutrophils Relative % 47 %   Neutro Abs 1.2 (L) 1.4 - 6.5 K/uL   Lymphocytes Relative 25 %   Lymphs Abs 0.6 (L) 1.0 - 3.6 K/uL   Monocytes Relative 17 %   Monocytes Absolute 0.4 0.2 - 1.0 K/uL   Eosinophils Relative 10 %   Eosinophils Absolute 0.2 0 - 0.7 K/uL   Basophils Relative 1 %   Basophils Absolute 0.0 0 - 0.1 K/uL    Comment: Performed at [facility],  [address] [place], [place] [address]  Hepatitis B surface antigen     Status: None   Collection Time: 03/22/17  2:30 PM  Result Value Ref Range   Hepatitis B Surface Ag Negative Negative    Comment: (NOTE) Performed At: [facility] [place], [place] [id number] Rush Farmer MD [phone] Performed at [facility], [address] [place], [place] [address]   Ferritin     Status: Abnormal   Collection Time: 04/03/17  1:33 PM  Result Value Ref Range   Ferritin 17 (L) 24 - 336 ng/mL    Comment: Performed at [facility], [address] [place], [place] [address]  Iron and TIBC     Status: Abnormal   Collection Time: 04/03/17  1:33 PM  Result Value Ref Range   Iron 13 (L) 45 - 182 ug/dL   TIBC 345 250 - 450 ug/dL   Saturation Ratios 4 (L) 17.9 - 39.5 %   UIBC 332 ug/dL    Comment: Performed at [facility], [address] [place], [place] [address]  Comprehensive metabolic panel     Status: Abnormal   Collection Time: 04/03/17  1:33 PM  Result Value Ref Range   Sodium 130 (L) 135 - 145 mmol/L   Potassium 4.3 3.5 - 5.1 mmol/L   Chloride 95 (L) 101 - 111 mmol/L   CO2 25 22 - 32 mmol/L   Glucose, Bld 175 (H) 65 - 99 mg/dL   BUN 20 6 - 20 mg/dL   Creatinine, Ser 1.40 (H) 0.61 - 1.24 mg/dL   Calcium 8.5 (L) 8.9 - 10.3 mg/dL   Total Protein 6.9 6.5 - 8.1 g/dL   Albumin 2.9 (L) 3.5 - 5.0 g/dL   AST 40 15 - 41 U/L   ALT 20 17 - 63 U/L   Alkaline Phosphatase 328 (H) 38 - 126 U/L   Total Bilirubin 1.8 (H) 0.3 - 1.2 mg/dL   GFR calc non Af Amer 53 (L) >60 mL/min  GFR calc Af Amer >60 >60 mL/min    Comment: (NOTE) The eGFR has been calculated using the CKD EPI equation. This calculation has not been validated in all clinical situations. eGFR's persistently <60 mL/min signify possible Chronic Kidney Disease.    Anion gap 10 5 - 15    Comment: Performed at [facility], [address] [place], [place]  [address]  CBC with Differential/Platelet     Status: Abnormal   Collection Time: 04/03/17  1:33 PM  Result Value Ref Range   WBC 3.0 (L) 3.8 - 10.6 K/uL   RBC 3.47 (L) 4.40 - 5.90 MIL/uL   Hemoglobin 8.3 (L) 13.0 - 18.0 g/dL   HCT 26.3 (L) 40.0 - 52.0 %   MCV 75.6 (L) 80.0 - 100.0 fL   MCH 23.9 (L) 26.0 - 34.0 pg   MCHC 31.6 (L) 32.0 - 36.0 g/dL   RDW 17.2 (H) 11.5 - 14.5 %   Platelets 142 (L) 150 - 440 K/uL   Neutrophils Relative % 63 %   Neutro Abs 1.9 1.4 - 6.5 K/uL   Lymphocytes Relative 18 %   Lymphs Abs 0.5 (L) 1.0 - 3.6 K/uL   Monocytes Relative 10 %   Monocytes Absolute 0.3 0.2 - 1.0 K/uL   Eosinophils Relative 8 %   Eosinophils Absolute 0.2 0 - 0.7 K/uL   Basophils Relative 1 %   Basophils Absolute 0.0 0 - 0.1 K/uL    Comment: Performed at [facility], [address] [place], [place] [address]  Glucose, capillary     Status: Abnormal   Collection Time: 04/15/17  8:20 AM  Result Value Ref Range   Glucose-Capillary 378 (H) 65 - 99 mg/dL  CBC with Differential/Platelet     Status: Abnormal   Collection Time: 04/17/17 10:23 AM  Result Value Ref Range   WBC 2.8 (L) 3.8 - 10.6 K/uL   RBC 3.88 (L) 4.40 - 5.90 MIL/uL   Hemoglobin 8.8 (L) 13.0 - 18.0 g/dL   HCT 28.1 (L) 40.0 - 52.0 %   MCV 72.4 (L) 80.0 - 100.0 fL   MCH 22.7 (L) 26.0 - 34.0 pg   MCHC 31.4 (L) 32.0 - 36.0 g/dL   RDW 16.6 (H) 11.5 - 14.5 %   Platelets 151 150 - 440 K/uL   Neutrophils Relative % 57 %   Neutro Abs 1.6 1.4 - 6.5 K/uL   Lymphocytes Relative 20 %   Lymphs Abs 0.5 (L) 1.0 - 3.6 K/uL   Monocytes Relative 15 %   Monocytes Absolute 0.4 0.2 - 1.0 K/uL   Eosinophils Relative 8 %   Eosinophils Absolute 0.2 0 - 0.7 K/uL   Basophils Relative 0 %   Basophils Absolute 0.0 0 - 0.1 K/uL    Comment: Performed at [facility], [address] [place], [place] [address]  Comprehensive metabolic panel     Status: Abnormal   Collection Time: 04/17/17 10:23 AM  Result Value Ref Range   Sodium  131 (L) 135 - 145 mmol/L   Potassium 4.0 3.5 - 5.1 mmol/L   Chloride 93 (L) 101 - 111 mmol/L   CO2 28 22 - 32 mmol/L   Glucose, Bld 97 65 - 99 mg/dL   BUN 16 6 - 20 mg/dL   Creatinine, Ser 1.31 (H) 0.61 - 1.24 mg/dL   Calcium 9.0 8.9 - 10.3 mg/dL   Total Protein 7.5 6.5 - 8.1 g/dL   Albumin 3.2 (L) 3.5 - 5.0 g/dL  AST 32 15 - 41 U/L   ALT 18 17 - 63 U/L   Alkaline Phosphatase 349 (H) 38 - 126 U/L   Total Bilirubin 1.6 (H) 0.3 - 1.2 mg/dL   GFR calc non Af Amer 58 (L) >60 mL/min   GFR calc Af Amer >60 >60 mL/min    Comment: (NOTE) The eGFR has been calculated using the CKD EPI equation. This calculation has not been validated in all clinical situations. eGFR's persistently <60 mL/min signify possible Chronic Kidney Disease.    Anion gap 10 5 - 15    Comment: Performed at [facility], [address] [place],  [address]   Objective  Body mass index is 23.57 kg/m. Wt Readings from Last 3 Encounters:  04/26/17 169 lb (76.7 kg)  04/17/17 162 lb 12.8 oz (73.8 kg)  04/15/17 165 lb (74.8 kg)   Temp Readings from Last 3 Encounters:  04/26/17 98.5 F (36.9 C) (Oral)  04/17/17 98.1 F (36.7 C) (Tympanic)  04/15/17 (!) 97 F (36.1 C) (Tympanic)   BP Readings from Last 3 Encounters:  04/26/17 (!) 98/54  04/17/17 121/71  04/15/17 94/60   Pulse Readings from Last 3 Encounters:  04/26/17 70  04/17/17 72  04/15/17 79    Physical Exam  Constitutional: He is oriented to person, place, and time. Vital signs are normal. He appears well-developed and well-nourished. He is cooperative.  Chronically ill appearing   HENT:  Head: Normocephalic and atraumatic.  Mouth/Throat: Oropharynx is clear and moist and mucous membranes are normal.  Eyes: Pupils are equal, round, and reactive to light. Conjunctivae are normal.  Cardiovascular: Normal rate, regular rhythm and normal heart sounds.  Pulmonary/Chest: Effort normal. He has wheezes.  Abdominal: Soft. Bowel sounds are  normal. He exhibits distension.    Large umbilical hernia on exam   Neurological: He is alert and oriented to person, place, and time. Gait normal.  Skin: Skin is warm, dry and intact.  Psychiatric: He has a normal mood and affect. His speech is normal and behavior is normal. Judgment and thought content normal. Cognition and memory are normal.  Nursing note and vitals reviewed.   Assessment   1. Metastatic renal renal cell CA with mets to lungs, renal vein invasion dx'ed in 2014 s/p left nephrectomy. H/o pancytopenia and iron def anemia now with leukopenia and anemia hbg last 8.8  2. DM 2 with neuropathy 10/19/16 A1C 7.7 and retinopathy OD 3. Cirrhosis (complicated by moderate portal htn, splenomegaly, ascites)/fatty liver/gallstones, not hep B immune with hep BeAg +, elevated alkaline phosphatase  4. Large umbilical hernia and left inguinal hernia  5. HTN with hypotension today  6. CKD 3 stable 7.gout 8. Mild malnutrition  9. Chronic b/l knee pain  10. HM   Plan   1.  F/u with Dr. Grayland Ormond  -he is getting opdivo since 04/2016=chemo Last PET scan 10/2016 mild decrease in fdg uptake assoc. With subcarinal adenopahty, stable dominant nodule in LLL. No bone met  Wheezing on exam today refilled albuterol inhaler prn   2.  Eye exam had fall 2018 [facility]  -care everywhere DM with moderate DR in OD 02/20/16 per chart review Dr. Valda Favia  Will do foot exam in future Urine protein due 08/28/16  Cont levemir 35 units qhs, SSI S given scale today  Gabapentin 200 mg tid for neuropathy  Not on ARB (was on losartan 25 mg qd)/ACEI im am assuming due to inc. hyperK on spironlactone  On lovastain  20 mg qhs  3.  F/u GI Dr. Vicente Males  Will disc with Dr. Vicente Males need for hep b vaccine and hepBeAg + VL neg  Also hep A Ab total +, hep C negative  On lasix 40 and spironolactone 50 mg qd wt up 7 lbs since 04/17/17  4. Monitor for now not causing pain  5. Reduce norvasc to 5 mg qd with lasix 40,  spironolactone 50 mg qd, nebivolol 20 mg qd  6. F/u with Dr. Holley Raring in [facility]  7.  Check uric acid on colchicine Rx by his brother qod 0.6 mg since 2015 w/o gout flare disc rec stop colchicine and do allopurinol for prophylaxis  Will disc again at f/u  8.  Disc premier protein shake instead of boost.   9.  Pt declines imaging for now  10 Had flu shot  Tdap per pt had ~ 5 years ago  pna 23 01/28/15 and prevnar 11/30/13  Disc shingrix in future   Check labs A1C, lipid,TSH, antitpo abs, vitamin D, PSA Uric acid   Former smoker  Check PSA in future  Colonoscopy get records [facility] in [place] release signed today   Dentist Dr. Cyndia Diver   Provider: Dr. Olivia Mackie [name]-Internal Medicine

## 2017-04-26 NOTE — Patient Instructions (Addendum)
If sugar is 131-180 2 units  181-240 4 units  241-300 6 units  301-350 8 units  351-400 10 units  >400 12 units   Try premier protein shakes  Please schedule fasting labs   Cut norvasc/amlodipine 10 mg in 1/2 new dose will be for 5 mg daily   Hypoglycemia Hypoglycemia occurs when the level of sugar (glucose) in the blood is too low. Glucose is a type of sugar that provides the body's main source of energy. Certain hormones (insulin and glucagon) control the level of glucose in the blood. Insulin lowers blood glucose, and glucagon increases blood glucose. Hypoglycemia can result from having too much insulin in the bloodstream, or from not eating enough food that contains glucose. Hypoglycemia can happen in people who do or do not have diabetes. It can develop quickly, and it can be a medical emergency. What are the causes? Hypoglycemia occurs most often in people who have diabetes. If you have diabetes, hypoglycemia may be caused by:  Diabetes medicine.  Not eating enough, or not eating often enough.  Increased physical activity.  Drinking alcohol, especially when you have not eaten recently.  If you do not have diabetes, hypoglycemia may be caused by:  A tumor in the pancreas. The pancreas is the organ that makes insulin.  Not eating enough, or not eating for long periods at a time (fasting).  Severe infection or illness that affects the liver, heart, or kidneys.  Certain medicines.  You may also have reactive hypoglycemia. This condition causes hypoglycemia within 4 hours of eating a meal. This may occur after having stomach surgery. Sometimes, the cause of reactive hypoglycemia is not known. What increases the risk? Hypoglycemia is more likely to develop in:  People who have diabetes and take medicines to lower blood glucose.  People who abuse alcohol.  People who have a severe illness.  What are the signs or symptoms? Hypoglycemia may not cause any symptoms. If you  have symptoms, they may include:  Hunger.  Anxiety.  Sweating and feeling clammy.  Confusion.  Dizziness or feeling light-headed.  Sleepiness.  Nausea.  Increased heart rate.  Headache.  Blurry vision.  Seizure.  Nightmares.  Tingling or numbness around the mouth, lips, or tongue.  A change in speech.  Decreased ability to concentrate.  A change in coordination.  Restless sleep.  Tremors or shakes.  Fainting.  Irritability.  How is this diagnosed? Hypoglycemia is diagnosed with a blood test to measure your blood glucose level. This blood test is done while you are having symptoms. Your health care provider may also do a physical exam and review your medical history. If you do not have diabetes, other tests may be done to find the cause of your hypoglycemia. How is this treated? This condition can often be treated by immediately eating or drinking something that contains glucose, such as:  3-4 sugar tablets (glucose pills).  Glucose gel, 15-gram tube.  Fruit juice, 4 oz (120 mL).  Regular soda (not diet soda), 4 oz (120 mL).  Low-fat milk, 4 oz (120 mL).  Several pieces of hard candy.  Sugar or honey, 1 Tbsp.  Treating Hypoglycemia If You Have Diabetes  If you are alert and able to swallow safely, follow the 15:15 rule:  Take 15 grams of a rapid-acting carbohydrate. Rapid-acting options include: ? 1 tube of glucose gel. ? 3 glucose pills. ? 6-8 pieces of hard candy. ? 4 oz (120 mL) of fruit juice. ? 4 oz (120  ml) of regular (not diet) soda.  Check your blood glucose 15 minutes after you take the carbohydrate.  If the repeat blood glucose level is still at or below 70 mg/dL (3.9 mmol/L), take 15 grams of a carbohydrate again.  If your blood glucose level does not increase above 70 mg/dL (3.9 mmol/L) after 3 tries, seek emergency medical care.  After your blood glucose level returns to normal, eat a meal or a snack within 1 hour.  Treating  Severe Hypoglycemia Severe hypoglycemia is when your blood glucose level is at or below 54 mg/dL (3 mmol/L). Severe hypoglycemia is an emergency. Do not wait to see if the symptoms will go away. Get medical help right away. Call your local emergency services (911 in the [place] Do not drive yourself to the hospital. If you have severe hypoglycemia and you cannot eat or drink, you may need an injection of glucagon. A family member or close friend should learn how to check your blood glucose and how to give you a glucagon injection. Ask your health care provider if you need to have an emergency glucagon injection kit available. Severe hypoglycemia may need to be treated in a hospital. The treatment may include getting glucose through an IV tube. You may also need treatment for the cause of your hypoglycemia. Follow these instructions at home: General instructions  Avoid any diets that cause you to not eat enough food. Talk with your health care provider before you start any new diet.  Take over-the-counter and prescription medicines only as told by your health care provider.  Limit alcohol intake to no more than 1 drink per day for nonpregnant women and 2 drinks per day for men. One drink equals 12 oz of beer, 5 oz of wine, or 1 oz of hard liquor.  Keep all follow-up visits as told by your health care provider. This is important. If You Have Diabetes:   Make sure you know the symptoms of hypoglycemia.  Always have a rapid-acting carbohydrate snack with you to treat low blood sugar.  Follow your diabetes management plan, as told by your health care provider. Make sure you: ? Take your medicines as directed. ? Follow your exercise plan. ? Follow your meal plan. Eat on time, and do not skip meals. ? Check your blood glucose as often as directed. Make sure to check your blood glucose before and after exercise. If you exercise longer or in a different way than usual, check your blood glucose more  often. ? Follow your sick day plan whenever you cannot eat or drink normally. Make this plan in advance with your health care provider.  Share your diabetes management plan with people in your workplace, school, and household.  Check your urine for ketones when you are ill and as told by your health care provider.  Carry a medical alert card or wear medical alert jewelry. If You Have Reactive Hypoglycemia or Low Blood Sugar From Other Causes:  Monitor your blood glucose as told by your health care provider.  Follow instructions from your health care provider about eating or drinking restrictions. Contact a health care provider if:  You have problems keeping your blood glucose in your target range.  You have frequent episodes of hypoglycemia. Get help right away if:  You continue to have hypoglycemia symptoms after eating or drinking something containing glucose.  Your blood glucose is at or below 54 mg/dL (3 mmol/L).  You have a seizure.  You faint. These symptoms may  represent a serious problem that is an emergency. Do not wait to see if the symptoms will go away. Get medical help right away. Call your local emergency services (911 in the [place] Do not drive yourself to the hospital. This information is not intended to replace advice given to you by your health care provider. Make sure you discuss any questions you have with your health care provider. Document Released: 12/18/2004 Document Revised: 06/01/2015 Document Reviewed: 01/21/2015 Elsevier Interactive Patient Education  [employer].

## 2017-04-30 ENCOUNTER — Other Ambulatory Visit: Payer: Medicare HMO

## 2017-05-01 ENCOUNTER — Inpatient Hospital Stay: Payer: Medicare HMO

## 2017-05-01 ENCOUNTER — Other Ambulatory Visit: Payer: Self-pay

## 2017-05-01 ENCOUNTER — Inpatient Hospital Stay: Payer: Medicare HMO | Attending: Oncology

## 2017-05-01 VITALS — BP 129/78 | HR 71 | Temp 98.7°F | Resp 18 | Wt 168.4 lb

## 2017-05-01 DIAGNOSIS — Z79899 Other long term (current) drug therapy: Secondary | ICD-10-CM | POA: Diagnosis not present

## 2017-05-01 DIAGNOSIS — Z5111 Encounter for antineoplastic chemotherapy: Secondary | ICD-10-CM | POA: Diagnosis not present

## 2017-05-01 DIAGNOSIS — D61818 Other pancytopenia: Secondary | ICD-10-CM | POA: Insufficient documentation

## 2017-05-01 DIAGNOSIS — C642 Malignant neoplasm of left kidney, except renal pelvis: Secondary | ICD-10-CM | POA: Diagnosis present

## 2017-05-01 DIAGNOSIS — E1122 Type 2 diabetes mellitus with diabetic chronic kidney disease: Secondary | ICD-10-CM | POA: Insufficient documentation

## 2017-05-01 DIAGNOSIS — C7801 Secondary malignant neoplasm of right lung: Secondary | ICD-10-CM | POA: Diagnosis not present

## 2017-05-01 DIAGNOSIS — K7460 Unspecified cirrhosis of liver: Secondary | ICD-10-CM | POA: Diagnosis not present

## 2017-05-01 DIAGNOSIS — N183 Chronic kidney disease, stage 3 (moderate): Secondary | ICD-10-CM | POA: Insufficient documentation

## 2017-05-01 DIAGNOSIS — F1020 Alcohol dependence, uncomplicated: Secondary | ICD-10-CM | POA: Diagnosis not present

## 2017-05-01 DIAGNOSIS — I129 Hypertensive chronic kidney disease with stage 1 through stage 4 chronic kidney disease, or unspecified chronic kidney disease: Secondary | ICD-10-CM | POA: Diagnosis not present

## 2017-05-01 DIAGNOSIS — C7802 Secondary malignant neoplasm of left lung: Principal | ICD-10-CM

## 2017-05-01 DIAGNOSIS — Z905 Acquired absence of kidney: Secondary | ICD-10-CM | POA: Insufficient documentation

## 2017-05-01 DIAGNOSIS — D508 Other iron deficiency anemias: Secondary | ICD-10-CM

## 2017-05-01 DIAGNOSIS — E871 Hypo-osmolality and hyponatremia: Secondary | ICD-10-CM | POA: Insufficient documentation

## 2017-05-01 DIAGNOSIS — C649 Malignant neoplasm of unspecified kidney, except renal pelvis: Secondary | ICD-10-CM

## 2017-05-01 DIAGNOSIS — Z87891 Personal history of nicotine dependence: Secondary | ICD-10-CM | POA: Insufficient documentation

## 2017-05-01 DIAGNOSIS — D509 Iron deficiency anemia, unspecified: Secondary | ICD-10-CM | POA: Diagnosis not present

## 2017-05-01 DIAGNOSIS — R188 Other ascites: Secondary | ICD-10-CM | POA: Diagnosis not present

## 2017-05-01 DIAGNOSIS — Z794 Long term (current) use of insulin: Secondary | ICD-10-CM | POA: Diagnosis not present

## 2017-05-01 LAB — COMPREHENSIVE METABOLIC PANEL
ALBUMIN: 2.9 g/dL — AB (ref 3.5–5.0)
ALK PHOS: 378 U/L — AB (ref 38–126)
ALT: 18 U/L (ref 17–63)
ANION GAP: 10 (ref 5–15)
AST: 40 U/L (ref 15–41)
BILIRUBIN TOTAL: 1.4 mg/dL — AB (ref 0.3–1.2)
BUN: 12 mg/dL (ref 6–20)
CALCIUM: 8.5 mg/dL — AB (ref 8.9–10.3)
CO2: 28 mmol/L (ref 22–32)
Chloride: 93 mmol/L — ABNORMAL LOW (ref 101–111)
Creatinine, Ser: 1.22 mg/dL (ref 0.61–1.24)
GFR calc Af Amer: >60 mL/min (ref >60)
GFR calc non Af Amer: >60 mL/min (ref >60)
GLUCOSE: 138 mg/dL — AB (ref 65–99)
Potassium: 4.1 mmol/L (ref 3.5–5.1)
SODIUM: 131 mmol/L — AB (ref 135–145)
Total Protein: 6.8 g/dL (ref 6.5–8.1)

## 2017-05-01 LAB — CBC WITH DIFFERENTIAL/PLATELET
BASOS ABS: 0 10*3/uL (ref 0–0.1)
BASOS PCT: 1 %
EOS ABS: 0.1 10*3/uL (ref 0–0.7)
Eosinophils Relative: 3 %
HEMATOCRIT: 30.3 % — AB (ref 40.0–52.0)
HEMOGLOBIN: 9.8 g/dL — AB (ref 13.0–18.0)
Lymphocytes Relative: 22 %
Lymphs Abs: 0.5 10*3/uL — ABNORMAL LOW (ref 1.0–3.6)
MCH: 24.8 pg — ABNORMAL LOW (ref 26.0–34.0)
MCHC: 32.5 g/dL (ref 32.0–36.0)
MCV: 76.4 fL — ABNORMAL LOW (ref 80.0–100.0)
MONOS PCT: 13 %
Monocytes Absolute: 0.3 10*3/uL (ref 0.2–1.0)
NEUTROS ABS: 1.5 10*3/uL (ref 1.4–6.5)
NEUTROS PCT: 61 %
Platelets: 102 10*3/uL — ABNORMAL LOW (ref 150–440)
RBC: 3.96 MIL/uL — ABNORMAL LOW (ref 4.40–5.90)
RDW: 23 % — ABNORMAL HIGH (ref 11.5–14.5)
WBC: 2.3 10*3/uL — ABNORMAL LOW (ref 3.8–10.6)

## 2017-05-01 MED ORDER — SODIUM CHLORIDE 0.9 % IV SOLN
240.0000 mg | Freq: Once | INTRAVENOUS | Status: AC
  Administered 2017-05-01: 240 mg via INTRAVENOUS
  Filled 2017-05-01: qty 24

## 2017-05-01 MED ORDER — FERUMOXYTOL INJECTION 510 MG/17 ML
510.0000 mg | Freq: Once | INTRAVENOUS | Status: AC
  Administered 2017-05-01: 510 mg via INTRAVENOUS
  Filled 2017-05-01: qty 17

## 2017-05-01 MED ORDER — SODIUM CHLORIDE 0.9 % IV SOLN
Freq: Once | INTRAVENOUS | Status: AC
  Administered 2017-05-01: 10:00:00 via INTRAVENOUS
  Filled 2017-05-01: qty 1000

## 2017-05-06 ENCOUNTER — Ambulatory Visit (INDEPENDENT_AMBULATORY_CARE_PROVIDER_SITE_OTHER): Payer: Medicare HMO | Admitting: Gastroenterology

## 2017-05-06 ENCOUNTER — Encounter: Payer: Self-pay | Admitting: Gastroenterology

## 2017-05-06 ENCOUNTER — Other Ambulatory Visit: Payer: Self-pay

## 2017-05-06 VITALS — BP 126/83 | HR 72 | Ht 71.0 in | Wt 167.8 lb

## 2017-05-06 DIAGNOSIS — B1910 Unspecified viral hepatitis B without hepatic coma: Secondary | ICD-10-CM | POA: Diagnosis not present

## 2017-05-06 DIAGNOSIS — R899 Unspecified abnormal finding in specimens from other organs, systems and tissues: Secondary | ICD-10-CM

## 2017-05-06 DIAGNOSIS — B161 Acute hepatitis B with delta-agent without hepatic coma: Secondary | ICD-10-CM

## 2017-05-06 DIAGNOSIS — K7460 Unspecified cirrhosis of liver: Secondary | ICD-10-CM

## 2017-05-06 DIAGNOSIS — R188 Other ascites: Secondary | ICD-10-CM

## 2017-05-06 NOTE — Progress Notes (Signed)
Jonathon Bellows MD, MRCP(U.K) [address]  [place]  [place], [place] [address]  Main: [phone]  Fax: [phone]   Primary Care Physician: [name], [name], MD  Primary Gastroenterologist:  Dr. Jonathon Bellows   Chief Complaint  Patient presents with  . Follow-up    HPI: Rodney Stewart is a 62 y.o. male   Summary of history :  Heis here to follow up forchronic liver disease and cirrhosis likely from alcohol and actively still drinking . Cirrhosis diagnosed in early 2019. He has a history of stage 4 RCC , b/l lung metastasis . CKD 3 and Diabetes Mellitus , on chemotherapy. He has pancytopenia and follows with Dr Grayland Ormond.  CT scan of the abdomen done on 02/18/2017 showed subcarinal lymphadenopathy. Multiple stable bilateral pulmonary nodules. Status post left nephrectomy. Liver features suggestive of cirrhosis splenomegaly recanalization of the paraumbilical vein. Large volume ascites. Cholelithiasis. Labs 02/19/17 : Immune to hep A. HCv ab -negative, Hep B e antigen positive , hep b c ab,HIV,AMA,Factin,ceruloplasmin -negtive. PTH normal. A1AT -normal , TSH elevated. Cr 1.37 , GFR 55, alk phos 293, ALT 16 , AST 39 , Ascites - no SBP,SAAG-suggetsive of portal HTN.  He has consumed 6-7 beers a day x many years atleast 30 years Hbsag, Hep B viral load -negative  Labs 04/2017 : Hb 8.3, MCV 75 ,Iron 13, Ferritin 17   Interval history4/4/19-05/06/17   EGD 04/15/17- [facility] noted no varices.   Found a new primary care doctor. Due for lab work . Has cut down alcohol- drinks occasionally he says. No significant abdominal distension.  Aldactone 100 mg and lasix 40 mg a day . Low salt diet .    Current Outpatient Medications  Medication Sig Dispense Refill  . acetaminophen (TYLENOL) 325 MG tablet Take 325 mg by mouth every 6 (six) hours as needed.    Marland Kitchen albuterol (PROVENTIL HFA;VENTOLIN HFA) 108 (90 Base) MCG/ACT inhaler Inhale 1-2 puffs into the lungs every 6  (six) hours as needed for wheezing or shortness of breath. 1 Inhaler 11  . ALPRAZolam (XANAX) 1 MG tablet Take 1 mg by mouth as needed for sleep.    Marland Kitchen amLODipine (NORVASC) 5 MG tablet Take 1 tablet (5 mg total) by mouth daily. 90 tablet 1  . aspirin 81 MG chewable tablet Chew 81 mg by mouth daily.    . colchicine 0.6 MG tablet Take 0.6 mg by mouth every other day.     . fluticasone (FLONASE) 50 MCG/ACT nasal spray   0  . gabapentin (NEURONTIN) 100 MG capsule Take 2 capsules (200 mg total) by mouth 3 (three) times daily. 360 capsule 5  . insulin aspart (NOVOLOG) 100 UNIT/ML FlexPen Before meals 131-180 2 units, 181-240 4 units, 241-300 6 units, 301-350 8 units, 351-400 10 units, >400 12 units 15 mL 0  . Insulin Detemir (LEVEMIR FLEXTOUCH) 100 UNIT/ML Pen Inject 35 Units into the skin daily at 10 pm. 15 pen 11  . lovastatin (MEVACOR) 20 MG tablet Take 20 mg by mouth daily.     . Nebivolol HCl 20 MG TABS Take 20 mg by mouth daily.    . ONE TOUCH ULTRA TEST test strip     . Pseudoeph-Doxylamine-DM-APAP (NYQUIL PO) Take by mouth as needed.    Marland Kitchen spironolactone (ALDACTONE) 50 MG tablet TAKE 1 TABLET BY MOUTH ONCE DAILY 60 tablet 0  . tamsulosin (FLOMAX) 0.4 MG CAPS capsule Take 0.4 mg by mouth every other day.     Marland Kitchen  furosemide (LASIX) 40 MG tablet Take 1 tablet (40 mg total) by mouth daily. 61 tablet 0   Current Facility-Administered Medications  Medication Dose Route Frequency Provider Last Rate Last Dose  . albumin human 25 % solution 12.5 g  12.5 g Intravenous Once Jonathon Bellows, MD      . albumin human 25 % solution 12.5 g  12.5 g Intravenous Once Jonathon Bellows, MD      . albumin human 5 % solution 25 g  25 g Intravenous Once Jonathon Bellows, MD        Allergies as of 05/06/2017  . (No Known Allergies)    ROS:  General: Negative for anorexia, weight loss, fever, chills, fatigue, weakness. ENT: Negative for hoarseness, difficulty swallowing , nasal congestion. CV: Negative for chest pain, angina,  palpitations, dyspnea on exertion, peripheral edema.  Respiratory: Negative for dyspnea at rest, dyspnea on exertion, cough, sputum, wheezing.  GI: See history of present illness. GU:  Negative for dysuria, hematuria, urinary incontinence, urinary frequency, nocturnal urination.  Endo: Negative for unusual weight change.    Physical Examination:   BP 126/83 (BP Location: Left Arm, Patient Position: Sitting, Cuff Size: Normal)   Pulse 72   Ht _0  (1.803 m)   Wt 167 lb 12.8 oz (76.1 kg)   BMI 23.40 kg/m   General: very thin  Eyes: No icterus. Conjunctivae pink. Mouth: Oropharyngeal mucosa moist and pink , no lesions erythema or exudate. Lungs: Clear to auscultation bilaterally. Non-labored. Heart: Regular rate and rhythm, no murmurs rubs or gallops.  Abdomen: mildly distended. Soft mo tenderness. Umbilical hernia reducible, some free fluid +, no guarding or rigidity  Extremities: No lower extremity edema. No clubbing or deformities. Neuro: Alert and oriented x 3.  Grossly intact. Skin: Warm and dry, no jaundice.   Psych: Alert and cooperative, normal mood and affect.   Imaging Studies: No results found.  Assessment and Plan:   Rodney Stewart is a 62 y.o. y/o malehere to follow upfor chronic liver disease likely secondary to alcohol. Still drinking alcohol. No encephelopathy. Elevated alkaline phosphotase likely from metastatic RCC. HepBe antigen negative with no virus , Hepatitis Delta antigen positive as well. Has cut down on alcohol    Plan  1..Low salt diet , avoid tylenol  2. RUQ USG in 6 months to screen for [facility] in 08/2017 3.  Hepatitis Delta antigen positive, check HDV viral load, check Hep Bsurface antigen  4. May require referral to [facility] to help treat Hep B and Delta antigen co infection which is complicated in his case due to cirrhosis , anemia and cancer.   Dr Jonathon Bellows  MD,MRCP [facility]) Follow up in 6 weeks

## 2017-05-08 ENCOUNTER — Other Ambulatory Visit: Payer: Self-pay

## 2017-05-08 ENCOUNTER — Other Ambulatory Visit (INDEPENDENT_AMBULATORY_CARE_PROVIDER_SITE_OTHER): Payer: Medicare HMO

## 2017-05-08 ENCOUNTER — Telehealth: Payer: Self-pay | Admitting: Internal Medicine

## 2017-05-08 DIAGNOSIS — Z794 Long term (current) use of insulin: Secondary | ICD-10-CM

## 2017-05-08 DIAGNOSIS — Z125 Encounter for screening for malignant neoplasm of prostate: Secondary | ICD-10-CM

## 2017-05-08 DIAGNOSIS — R7989 Other specified abnormal findings of blood chemistry: Secondary | ICD-10-CM

## 2017-05-08 DIAGNOSIS — E1142 Type 2 diabetes mellitus with diabetic polyneuropathy: Secondary | ICD-10-CM

## 2017-05-08 DIAGNOSIS — M109 Gout, unspecified: Secondary | ICD-10-CM

## 2017-05-08 DIAGNOSIS — E559 Vitamin D deficiency, unspecified: Secondary | ICD-10-CM

## 2017-05-08 DIAGNOSIS — R899 Unspecified abnormal finding in specimens from other organs, systems and tissues: Secondary | ICD-10-CM | POA: Diagnosis not present

## 2017-05-08 NOTE — Telephone Encounter (Signed)
Pt Iwould like to speak to Dr. Aundra Dubin before refilling his  Insulin Detemir (LEVEMIR FLEXTOUCH) 100 UNIT/ML Pen. Cost has doubled and pt called insurance company. Insurance told him how provider should write prescription so it is cheaper. Pt has an appt on 5/28 and does not want to wait to speak to her about this. Please call pt at [phone].

## 2017-05-08 NOTE — Addendum Note (Signed)
Addended by: Arby Barrette on: 05/08/2017 02:19 PM   Modules accepted: Orders

## 2017-05-08 NOTE — Telephone Encounter (Signed)
Please advise 

## 2017-05-09 ENCOUNTER — Other Ambulatory Visit: Payer: Self-pay | Admitting: Internal Medicine

## 2017-05-09 DIAGNOSIS — M109 Gout, unspecified: Secondary | ICD-10-CM

## 2017-05-09 DIAGNOSIS — E1142 Type 2 diabetes mellitus with diabetic polyneuropathy: Secondary | ICD-10-CM

## 2017-05-09 DIAGNOSIS — Z794 Long term (current) use of insulin: Secondary | ICD-10-CM

## 2017-05-09 MED ORDER — INSULIN DETEMIR 100 UNIT/ML FLEXPEN
35.0000 [IU] | PEN_INJECTOR | Freq: Every day | SUBCUTANEOUS | 12 refills | Status: DC
Start: 2017-05-09 — End: 2017-05-28

## 2017-05-09 MED ORDER — ALLOPURINOL 100 MG PO TABS: 100.0000 mg | ORAL_TABLET | Freq: Every day | ORAL | 0 refills | Status: DC

## 2017-05-09 NOTE — Telephone Encounter (Signed)
Spoke with pt given 10 pens instead of 15 at a time needs 30 day supply   [place]

## 2017-05-10 LAB — LIPID PANEL
Cholesterol: 100 mg/dL (ref <200)
HDL: 34 mg/dL — AB (ref >40)
LDL CHOLESTEROL (CALC): 48 mg/dL
NON-HDL CHOLESTEROL (CALC): 66 mg/dL (ref <130)
TRIGLYCERIDES: 95 mg/dL (ref <150)
Total CHOL/HDL Ratio: 2.9 (calc) (ref <5.0)

## 2017-05-10 LAB — URIC ACID: Uric Acid, Serum: 9.9 mg/dL — ABNORMAL HIGH (ref 4.0–8.0)

## 2017-05-10 LAB — PSA

## 2017-05-10 LAB — TSH: TSH: 7.45 mIU/L — ABNORMAL HIGH (ref 0.40–4.50)

## 2017-05-10 LAB — VITAMIN D 25 HYDROXY (VIT D DEFICIENCY, FRACTURES): Vit D, 25-Hydroxy: 35 ng/mL (ref 30–100)

## 2017-05-10 LAB — HEMOGLOBIN A1C

## 2017-05-10 LAB — THYROID PEROXIDASE ANTIBODY: THYROID PEROXIDASE ANTIBODY: 1 [IU]/mL (ref <9)

## 2017-05-11 LAB — HEPATITIS D QRT-PCR (PLASMA): HDV qRT-PCR (plasma): NOT DETECTED

## 2017-05-11 LAB — HEPATITIS B SURFACE ANTIGEN: HEP B S AG: NEGATIVE

## 2017-05-13 ENCOUNTER — Other Ambulatory Visit: Payer: Self-pay | Admitting: Gastroenterology

## 2017-05-13 ENCOUNTER — Telehealth: Payer: Self-pay | Admitting: Gastroenterology

## 2017-05-13 NOTE — Progress Notes (Signed)
[place]  [phone] [phone]  ID: Diamantina Providence OB: 1955-07-13  MR#: [id number]  DVV#:[id number]  Patient Care Team: [name], [name], MD as PCP - General (Internal Medicine)  CHIEF COMPLAINT: Stage IV renal cell carcinoma with bilateral lung metastases.  INTERVAL HISTORY: Patient returns to clinic today for further evaluation and consideration of cycle 28 of nivolumab.  He currently feels well and is at his baseline.  He continues to have mild ascites, but has not required a paracentesis recently.  He denies any abdominal pain. He continues to have significant peripheral neuropathy in both feet secondary to diabetes, but has no other neurologic complaints. He denies any fevers, chills, or recent illnesses. He has a good appetite and denies weight loss. He denies any chest pain, shortness of breath, cough, or hemoptysis. He has no nausea, vomiting, constipation, or diarrhea. He has no melena or hematochezia. He has no urinary complaints.  Patient offers no specific complaints today.  REVIEW OF SYSTEMS:   Review of Systems  Constitutional: Negative for fever, malaise/fatigue and weight loss.  Eyes: Negative for blurred vision.  Respiratory: Negative.  Negative for cough and shortness of breath.   Cardiovascular: Negative.  Negative for chest pain and leg swelling.  Gastrointestinal: Negative.  Negative for abdominal pain, constipation, diarrhea, nausea and vomiting.  Genitourinary: Negative.  Negative for dysuria.  Musculoskeletal: Negative.   Skin: Negative.  Negative for rash.  Neurological: Positive for tingling and sensory change. Negative for focal weakness, weakness and headaches.  Psychiatric/Behavioral: Negative.  The patient is not nervous/anxious and does not have insomnia.    As per HPI. Otherwise, a complete review of systems is negative.   PAST MEDICAL HISTORY: Past Medical History:  Diagnosis Date  . Anemia   . BPH  (benign prostatic hyperplasia)   . CKD (chronic kidney disease) stage 3, GFR 30-59 ml/min (HCC)   . Clear cell renal cell carcinoma (HCC)   . Colon polyps   . Diabetes mellitus without complication ([place])    type 2   . Dyspnea    with exertion  . GERD (gastroesophageal reflux disease)   . History of gout   . History of nephrectomy    Left  . Hypertension   . Neuropathy   . Renal cell carcinoma of left kidney (HCC)    mets to lungs  . Umbilical hernia     PAST SURGICAL HISTORY: Past Surgical History:  Procedure Laterality Date  . BACK SURGERY     ruptured disc 1991   . ENDOBRONCHIAL ULTRASOUND N/A 10/14/2015   Procedure: ENDOBRONCHIAL ULTRASOUND;  Surgeon: [name], MD;  Location: [facility] ORS;  Service: Pulmonary;  Laterality: N/A;  . ESOPHAGOGASTRODUODENOSCOPY (EGD) WITH PROPOFOL N/A 04/15/2017   Procedure: ESOPHAGOGASTRODUODENOSCOPY (EGD) WITH PROPOFOL;  Surgeon: Jonathon Bellows, MD;  Location: [facility] ENDOSCOPY;  Service: Gastroenterology;  Laterality: N/A;  Screen for esophageal varices  . EYE SURGERY Bilateral    Cataract Extraction with IOL  . KNEE SURGERY Right   . NEPHRECTOMY     left kidney 2014 renal cell cancer     FAMILY HISTORY: Family History  Problem Relation Age of Onset  . Ovarian cancer Mother   . Diabetes Father   . Hypertension Father     ADVANCED DIRECTIVES (Y/N):  N  HEALTH MAINTENANCE: Social History   Tobacco Use  . Smoking status: Former Smoker    Packs/day: 1.00    Types: Cigarettes    Last attempt to quit: 04/15/1993  Years since quitting: 24.0  . Smokeless tobacco: Never Used  Substance Use Topics  . Alcohol use: Yes    Alcohol/week: 25.2 oz    Types: 42 Cans of beer per week    Comment: 4-5 cans of beer a day  . Drug use: No     Colonoscopy:  PAP:  Bone density:  Lipid panel:  No Known Allergies  Current Outpatient Medications  Medication Sig Dispense Refill  . amLODipine (NORVASC) 5 MG tablet Take 1 tablet (5 mg  total) by mouth daily. 90 tablet 1  . aspirin 81 MG chewable tablet Chew 81 mg by mouth daily.    . colchicine 0.6 MG tablet Take 0.6 mg by mouth every other day.     . furosemide (LASIX) 40 MG tablet TAKE 1 TABLET BY MOUTH ONCE DAILY 61 tablet 0  . gabapentin (NEURONTIN) 100 MG capsule Take 2 capsules (200 mg total) by mouth 3 (three) times daily. 360 capsule 5  . insulin aspart (NOVOLOG) 100 UNIT/ML FlexPen Before meals 131-180 2 units, 181-240 4 units, 241-300 6 units, 301-350 8 units, 351-400 10 units, >400 12 units 15 mL 0  . Insulin Detemir (LEVEMIR FLEXTOUCH) 100 UNIT/ML Pen Inject 35 Units into the skin daily at 10 pm. 10 pen 12  . lovastatin (MEVACOR) 20 MG tablet Take 20 mg by mouth daily.     . Nebivolol HCl 20 MG TABS Take 20 mg by mouth daily.    . ONE TOUCH ULTRA TEST test strip     . spironolactone (ALDACTONE) 50 MG tablet TAKE 1 TABLET BY MOUTH ONCE DAILY 60 tablet 0  . tamsulosin (FLOMAX) 0.4 MG CAPS capsule Take 0.4 mg by mouth every other day.     Marland Kitchen acetaminophen (TYLENOL) 325 MG tablet Take 325 mg by mouth every 6 (six) hours as needed.    Marland Kitchen albuterol (PROVENTIL HFA;VENTOLIN HFA) 108 (90 Base) MCG/ACT inhaler Inhale 1-2 puffs into the lungs every 6 (six) hours as needed for wheezing or shortness of breath. (Patient not taking: Reported on 05/15/2017) 1 Inhaler 11  . allopurinol (ZYLOPRIM) 100 MG tablet Take 1 tablet (100 mg total) by mouth daily. (Patient not taking: Reported on 05/15/2017) 90 tablet 0  . ALPRAZolam (XANAX) 1 MG tablet Take 1 mg by mouth as needed for sleep.    . fluticasone (FLONASE) 50 MCG/ACT nasal spray   0  . Pseudoeph-Doxylamine-DM-APAP (NYQUIL PO) Take by mouth as needed.     Current Facility-Administered Medications  Medication Dose Route Frequency Provider Last Rate Last Dose  . albumin human 25 % solution 12.5 g  12.5 g Intravenous Once Jonathon Bellows, MD      . albumin human 25 % solution 12.5 g  12.5 g Intravenous Once Jonathon Bellows, MD      . albumin  human 5 % solution 25 g  25 g Intravenous Once Jonathon Bellows, MD        OBJECTIVE: Vitals:   05/15/17 0931  BP: 118/76  Pulse: 71  Resp: 18  Temp: (!) 97.2 F (36.2 C)  SpO2: 99%     Body mass index is 23.05 kg/m.    ECOG FS:0 - Asymptomatic  General: Well-developed, well-nourished, no acute distress. Eyes: Pink conjunctiva, anicteric sclera. HEENT: Normocephalic, moist mucous membranes, clear oropharnyx. Lungs: Clear to auscultation bilaterally. Heart: Regular rate and rhythm. No rubs, murmurs, or gallops. Abdomen: Mildly distended, nontender.  Reducible umbilical hernia. Musculoskeletal: No edema, cyanosis, or clubbing. Neuro: Alert, answering all questions appropriately.  Cranial nerves grossly intact. Skin: No rashes or petechiae noted. Psych: Normal affect.    LAB RESULTS:  Lab Results  Component Value Date   NA 129 (L) 05/15/2017   K 3.8 05/15/2017   CL 89 (L) 05/15/2017   CO2 32 05/15/2017   GLUCOSE 245 (H) 05/15/2017   BUN 15 05/15/2017   CREATININE 1.26 (H) 05/15/2017   CALCIUM 8.5 (L) 05/15/2017   PROT 7.1 05/15/2017   ALBUMIN 3.0 (L) 05/15/2017   AST 37 05/15/2017   ALT 21 05/15/2017   ALKPHOS 401 (H) 05/15/2017   BILITOT 2.3 (H) 05/15/2017   GFRNONAA >60 05/15/2017   GFRAA >60 05/15/2017    Lab Results  Component Value Date   WBC 2.8 (L) 05/15/2017   NEUTROABS 1.6 05/15/2017   HGB 10.9 (L) 05/15/2017   HCT 32.8 (L) 05/15/2017   MCV 82.6 05/15/2017   PLT 110 (L) 05/15/2017   Lab Results  Component Value Date   IRON 13 (L) 04/03/2017   TIBC 345 04/03/2017   IRONPCTSAT 4 (L) 04/03/2017   Lab Results  Component Value Date   FERRITIN 17 (L) 04/03/2017     STUDIES: No results found.   Oncology history: Patient underwent left nephrectomy on May 16, 2012 which revealed a clear cell grade 2 renal cell carcinoma, stage TIIIa, N0, M0. Tumor size of 16 cm. Patient was noted to have renal vein involvement, but no other structures were involved. 0 of  2 lymph nodes were negative for disease. PET scan on October 20, 2015 revealed metastatic disease and patient was initiated on Votrient. This was subsequently discontinued in March 2018 secondary to progression of disease. Patient initiated second line treatment with nivolumab on Monday, April 02, 2016  ASSESSMENT: Stage IV left renal cell carcinoma with metastasis to the lungs  PLAN:    1. Stage IV left renal cell carcinoma with metastasis to the lungs: CT scan results from February 18, 2017 reviewed independently with continued improvement of disease burden.  Proceed with cycle 28 of nivolumab today.  Patient will receive a flat dose of 240 mg every 2 weeks until intolerable side effects or progression of disease.  Return to clinic in 2 weeks for consideration of cycle 29 and then in 4 weeks for further evaluation and consideration of cycle 30.  We will reimage with CT scan prior to cycle 30.   2. Pancytopenia: Chronic.  Likely secondary to bone marrow suppression from patient's history of alcoholism as well as underlying cirrhosis. Can consider bone marrow biopsy in the future if necessary.   3. Renal insufficiency: Patient's creatinine is mildly elevated, but approximately his baseline.  4. Peripheral neuropathy: Chronic and unchanged.  Likely secondary to diabetes, continue current dose of gabapentin. Patient plans to discuss with his primary care adjusting his medications possibly pursuing Lyrica. Monitor. 5.  Cirrhosis/ascites: Appreciate GI input.  Patient's most recent paracentesis was on March 22, 2017 for approximately 6.8 L of fluid was removed. Continue monitoring and treatment per GI.   5. Hyperbilirubinemia: Secondary to cirrhosis.  Bilirubin has trended up slightly.  Proceed with treatment as above and follow-up with GI as scheduled.   6. Hyperglycemia: Patient appears to have improved glycemic control.  Unrelated to current treatment. Continue close follow-up with primary care. 7.   Hyponatremia: Decreased, but stable.  Monitor.   8.  Iron deficiency anemia: Hemoglobin has improved with IV Feraheme.  Patient's last infusion occurred on May 01, 2017.      Patient expressed  understanding and was in agreement with this plan. He also understands that He can call clinic at any time with any questions, concerns, or complaints.   Cancer Staging Metastatic renal cell carcinoma to lung [facility]) Staging form: Kidney, AJCC 7th Edition - Clinical stage from 10/12/2015: Stage IV (TX, N0, M1) - Signed by Lloyd Huger, MD on 10/12/2015   Lloyd Huger, MD 05/15/17 9:50 AM

## 2017-05-13 NOTE — Telephone Encounter (Signed)
Pt left vm he states he has no refills on rx rosamine or does that mean he has no approval please call him to clarify cb  [phone]

## 2017-05-15 ENCOUNTER — Inpatient Hospital Stay: Payer: Medicare HMO

## 2017-05-15 ENCOUNTER — Inpatient Hospital Stay (HOSPITAL_BASED_OUTPATIENT_CLINIC_OR_DEPARTMENT_OTHER): Payer: Medicare HMO | Admitting: Oncology

## 2017-05-15 ENCOUNTER — Telehealth: Payer: Self-pay | Admitting: Gastroenterology

## 2017-05-15 ENCOUNTER — Encounter: Payer: Self-pay | Admitting: Oncology

## 2017-05-15 VITALS — BP 118/76 | HR 71 | Temp 97.2°F | Resp 18 | Ht 71.0 in | Wt 165.3 lb

## 2017-05-15 DIAGNOSIS — E871 Hypo-osmolality and hyponatremia: Secondary | ICD-10-CM | POA: Diagnosis not present

## 2017-05-15 DIAGNOSIS — C649 Malignant neoplasm of unspecified kidney, except renal pelvis: Secondary | ICD-10-CM

## 2017-05-15 DIAGNOSIS — C7801 Secondary malignant neoplasm of right lung: Secondary | ICD-10-CM | POA: Diagnosis not present

## 2017-05-15 DIAGNOSIS — C642 Malignant neoplasm of left kidney, except renal pelvis: Secondary | ICD-10-CM

## 2017-05-15 DIAGNOSIS — D61818 Other pancytopenia: Secondary | ICD-10-CM

## 2017-05-15 DIAGNOSIS — I129 Hypertensive chronic kidney disease with stage 1 through stage 4 chronic kidney disease, or unspecified chronic kidney disease: Secondary | ICD-10-CM

## 2017-05-15 DIAGNOSIS — D509 Iron deficiency anemia, unspecified: Secondary | ICD-10-CM

## 2017-05-15 DIAGNOSIS — Z79899 Other long term (current) drug therapy: Secondary | ICD-10-CM

## 2017-05-15 DIAGNOSIS — C7802 Secondary malignant neoplasm of left lung: Secondary | ICD-10-CM | POA: Diagnosis not present

## 2017-05-15 DIAGNOSIS — Z5111 Encounter for antineoplastic chemotherapy: Secondary | ICD-10-CM | POA: Diagnosis not present

## 2017-05-15 DIAGNOSIS — R188 Other ascites: Secondary | ICD-10-CM | POA: Diagnosis not present

## 2017-05-15 DIAGNOSIS — E1122 Type 2 diabetes mellitus with diabetic chronic kidney disease: Secondary | ICD-10-CM

## 2017-05-15 DIAGNOSIS — N183 Chronic kidney disease, stage 3 (moderate): Secondary | ICD-10-CM | POA: Diagnosis not present

## 2017-05-15 LAB — CBC WITH DIFFERENTIAL/PLATELET
BASOS ABS: 0 10*3/uL (ref 0–0.1)
BASOS PCT: 2 %
Eosinophils Absolute: 0.2 10*3/uL (ref 0–0.7)
Eosinophils Relative: 6 %
HEMATOCRIT: 32.8 % — AB (ref 40.0–52.0)
Hemoglobin: 10.9 g/dL — ABNORMAL LOW (ref 13.0–18.0)
Lymphocytes Relative: 22 %
Lymphs Abs: 0.6 10*3/uL — ABNORMAL LOW (ref 1.0–3.6)
MCH: 27.4 pg (ref 26.0–34.0)
MCHC: 33.2 g/dL (ref 32.0–36.0)
MCV: 82.6 fL (ref 80.0–100.0)
Monocytes Absolute: 0.3 10*3/uL (ref 0.2–1.0)
Monocytes Relative: 12 %
NEUTROS ABS: 1.6 10*3/uL (ref 1.4–6.5)
NEUTROS PCT: 58 %
PLATELETS: 110 10*3/uL — AB (ref 150–440)
RBC: 3.97 MIL/uL — AB (ref 4.40–5.90)
RDW: 27.1 % — ABNORMAL HIGH (ref 11.5–14.5)
WBC: 2.8 10*3/uL — ABNORMAL LOW (ref 3.8–10.6)

## 2017-05-15 LAB — COMPREHENSIVE METABOLIC PANEL
ALK PHOS: 401 U/L — AB (ref 38–126)
ALT: 21 U/L (ref 17–63)
ANION GAP: 8 (ref 5–15)
AST: 37 U/L (ref 15–41)
Albumin: 3 g/dL — ABNORMAL LOW (ref 3.5–5.0)
BILIRUBIN TOTAL: 2.3 mg/dL — AB (ref 0.3–1.2)
BUN: 15 mg/dL (ref 6–20)
CALCIUM: 8.5 mg/dL — AB (ref 8.9–10.3)
CO2: 32 mmol/L (ref 22–32)
Chloride: 89 mmol/L — ABNORMAL LOW (ref 101–111)
Creatinine, Ser: 1.26 mg/dL — ABNORMAL HIGH (ref 0.61–1.24)
GFR calc non Af Amer: >60 mL/min (ref >60)
Glucose, Bld: 245 mg/dL — ABNORMAL HIGH (ref 65–99)
Potassium: 3.8 mmol/L (ref 3.5–5.1)
SODIUM: 129 mmol/L — AB (ref 135–145)
TOTAL PROTEIN: 7.1 g/dL (ref 6.5–8.1)

## 2017-05-15 MED ORDER — SODIUM CHLORIDE 0.9 % IV SOLN
Freq: Once | INTRAVENOUS | Status: AC
Start: 2017-05-15 — End: 2017-05-15
  Administered 2017-05-15: 10:00:00 via INTRAVENOUS
  Filled 2017-05-15: qty 1000

## 2017-05-15 MED ORDER — SODIUM CHLORIDE 0.9 % IV SOLN
240.0000 mg | Freq: Once | INTRAVENOUS | Status: AC
  Administered 2017-05-15: 240 mg via INTRAVENOUS
  Filled 2017-05-15: qty 24

## 2017-05-15 NOTE — Telephone Encounter (Signed)
Pt called back he states he thinks his rx is at the pharmacy

## 2017-05-15 NOTE — Progress Notes (Signed)
No new changes noted today 

## 2017-05-28 ENCOUNTER — Ambulatory Visit (INDEPENDENT_AMBULATORY_CARE_PROVIDER_SITE_OTHER): Payer: Medicare HMO | Admitting: Internal Medicine

## 2017-05-28 ENCOUNTER — Encounter: Payer: Self-pay | Admitting: Internal Medicine

## 2017-05-28 VITALS — BP 116/70 | HR 73 | Temp 99.8°F | Ht 71.0 in | Wt 165.8 lb

## 2017-05-28 DIAGNOSIS — R69 Illness, unspecified: Secondary | ICD-10-CM | POA: Diagnosis not present

## 2017-05-28 DIAGNOSIS — R188 Other ascites: Secondary | ICD-10-CM

## 2017-05-28 DIAGNOSIS — E871 Hypo-osmolality and hyponatremia: Secondary | ICD-10-CM

## 2017-05-28 DIAGNOSIS — M1A9XX0 Chronic gout, unspecified, without tophus (tophi): Secondary | ICD-10-CM | POA: Diagnosis not present

## 2017-05-28 DIAGNOSIS — N183 Chronic kidney disease, stage 3 unspecified: Secondary | ICD-10-CM

## 2017-05-28 DIAGNOSIS — E113399 Type 2 diabetes mellitus with moderate nonproliferative diabetic retinopathy without macular edema, unspecified eye: Secondary | ICD-10-CM | POA: Diagnosis not present

## 2017-05-28 DIAGNOSIS — J9801 Acute bronchospasm: Secondary | ICD-10-CM

## 2017-05-28 DIAGNOSIS — E1142 Type 2 diabetes mellitus with diabetic polyneuropathy: Secondary | ICD-10-CM | POA: Diagnosis not present

## 2017-05-28 DIAGNOSIS — Z794 Long term (current) use of insulin: Secondary | ICD-10-CM

## 2017-05-28 DIAGNOSIS — K7460 Unspecified cirrhosis of liver: Secondary | ICD-10-CM

## 2017-05-28 LAB — POCT GLYCOSYLATED HEMOGLOBIN (HGB A1C): HbA1c, POC (controlled diabetic range): 8.3 % — AB (ref 0.0–7.0)

## 2017-05-28 MED ORDER — INSULIN DETEMIR 100 UNIT/ML FLEXPEN: 40.0000 [IU] | PEN_INJECTOR | Freq: Every day | SUBCUTANEOUS | 12 refills | Status: DC

## 2017-05-28 MED ORDER — ALBUTEROL SULFATE HFA 108 (90 BASE) MCG/ACT IN AERS: 1.0000 | INHALATION_SPRAY | Freq: Four times a day (QID) | RESPIRATORY_TRACT | 11 refills | Status: DC | PRN

## 2017-05-28 MED ORDER — DOXYCYCLINE HYCLATE 100 MG PO TABS: 100.0000 mg | ORAL_TABLET | Freq: Two times a day (BID) | ORAL | 0 refills | Status: DC

## 2017-05-28 MED ORDER — ONETOUCH ULTRA BLUE VI STRP: ORAL_STRIP | 3 refills | Status: AC

## 2017-05-28 NOTE — Progress Notes (Signed)
Chief Complaint  Patient presents with  . Follow-up  . Cough  . URI   F/u  1. C/o cough x 10 days since [place] from [place] brother was also sick He is taking Mucinex which is helping he also is wheezing  2. DM 2 with hyperglycemia >200s this am 163 on levemir 35 units also needs refill one touch ultra checking bid. H/o moderate DR needs to see eye MD Dr. Valda Favia in [place] will refer today  3. Cirrhosis f/u with Dr. Vicente Males this week  4. Gout on colchicine from brother disc'ed with pt risks of this med with liver failure and contraindicated rec allopurinol but his brother had c/w this med after review he will start allopurinol uric acid 9.9   Review of Systems  Constitutional: Negative for weight loss.       +weakness   HENT: Negative for hearing loss.   Eyes: Negative for blurred vision.  Respiratory: Positive for cough, sputum production and wheezing. Negative for shortness of breath.   Cardiovascular: Negative for chest pain.  Gastrointestinal: Negative for abdominal pain.  Musculoskeletal: Positive for joint pain.       +knee pain   Skin: Negative for rash.  Neurological: Negative for headaches.  Psychiatric/Behavioral: Negative for depression.   Past Medical History:  Diagnosis Date  . Anemia   . BPH (benign prostatic hyperplasia)   . CKD (chronic kidney disease) stage 3, GFR 30-59 ml/min (HCC)   . Clear cell renal cell carcinoma (HCC)   . Colon polyps   . Diabetes mellitus without complication ([place])    type 2   . Dyspnea    with exertion  . GERD (gastroesophageal reflux disease)   . History of gout   . History of nephrectomy    Left  . Hypertension   . Neuropathy   . Renal cell carcinoma of left kidney (HCC)    mets to lungs  . Umbilical hernia    Past Surgical History:  Procedure Laterality Date  . BACK SURGERY     ruptured disc 1991   . ENDOBRONCHIAL ULTRASOUND N/A 10/14/2015   Procedure: ENDOBRONCHIAL ULTRASOUND;  Surgeon: [name], MD;  Location: [facility] ORS;  Service: Pulmonary;  Laterality: N/A;  . ESOPHAGOGASTRODUODENOSCOPY (EGD) WITH PROPOFOL N/A 04/15/2017   Procedure: ESOPHAGOGASTRODUODENOSCOPY (EGD) WITH PROPOFOL;  Surgeon: Jonathon Bellows, MD;  Location: [facility];  Service: Gastroenterology;  Laterality: N/A;  Screen for esophageal varices  . EYE SURGERY Bilateral    Cataract Extraction with IOL  . KNEE SURGERY Right   . NEPHRECTOMY     left kidney 2014 renal cell cancer    Family History  Problem Relation Age of Onset  . Ovarian cancer Mother   . Diabetes Father   . Hypertension Father    Social History   Socioeconomic History  . Marital status: Divorced    Spouse name: Not on file  . Number of children: Not on file  . Years of education: Not on file  . Highest education level: Not on file  Occupational History  . Not on file  Social Needs  . Financial resource strain: Not on file  . Food insecurity:    Worry: Not on file    Inability: Not on file  . Transportation needs:    Medical: Not on file    Non-medical: Not on file  Tobacco Use  . Smoking status: Former Smoker    Packs/day: 1.00    Types: Cigarettes    Last  attempt to quit: 04/15/1993    Years since quitting: 24.1  . Smokeless tobacco: Never Used  Substance and Sexual Activity  . Alcohol use: Yes    Alcohol/week: 25.2 oz    Types: 42 Cans of beer per week    Comment: 4-5 cans of beer a day  . Drug use: No  . Sexual activity: Yes  Lifestyle  . Physical activity:    Days per week: Not on file    Minutes per session: Not on file  . Stress: Not on file  Relationships  . Social connections:    Talks on phone: Not on file    Gets together: Not on file    Attends religious service: Not on file    Active member of club or organization: Not on file    Attends meetings of clubs or organizations: Not on file    Relationship status: Not on file  . Intimate partner violence:    Fear of current or ex partner: Not on  file    Emotionally abused: Not on file    Physically abused: Not on file    Forced sexual activity: Not on file  Other Topics Concern  . Not on file  Social History Narrative   HS graduate    Retired    Divorced    2 daughters    Lives alone    [name] to travel to [place]    1 living brother who is doctor in [place], wears seat belt    Current Meds  Medication Sig  . acetaminophen (TYLENOL) 325 MG tablet Take 325 mg by mouth every 6 (six) hours as needed.  Marland Kitchen albuterol (PROVENTIL HFA;VENTOLIN HFA) 108 (90 Base) MCG/ACT inhaler Inhale 1-2 puffs into the lungs every 6 (six) hours as needed for wheezing or shortness of breath.  . allopurinol (ZYLOPRIM) 100 MG tablet Take 1 tablet (100 mg total) by mouth daily.  Marland Kitchen ALPRAZolam (XANAX) 1 MG tablet Take 1 mg by mouth as needed for sleep.  Marland Kitchen amLODipine (NORVASC) 5 MG tablet Take 1 tablet (5 mg total) by mouth daily.  Marland Kitchen aspirin 81 MG chewable tablet Chew 81 mg by mouth daily.  . colchicine 0.6 MG tablet Take 0.6 mg by mouth every other day.   . fluticasone (FLONASE) 50 MCG/ACT nasal spray   . furosemide (LASIX) 40 MG tablet TAKE 1 TABLET BY MOUTH ONCE DAILY  . gabapentin (NEURONTIN) 100 MG capsule Take 2 capsules (200 mg total) by mouth 3 (three) times daily.  . insulin aspart (NOVOLOG) 100 UNIT/ML FlexPen Before meals 131-180 2 units, 181-240 4 units, 241-300 6 units, 301-350 8 units, 351-400 10 units, >400 12 units  . Insulin Detemir (LEVEMIR FLEXTOUCH) 100 UNIT/ML Pen Inject 35 Units into the skin daily at 10 pm.  . lovastatin (MEVACOR) 20 MG tablet Take 20 mg by mouth daily.   . Nebivolol HCl 20 MG TABS Take 20 mg by mouth daily.  . ONE TOUCH ULTRA TEST test strip   . Pseudoeph-Doxylamine-DM-APAP (NYQUIL PO) Take by mouth as needed.  Marland Kitchen spironolactone (ALDACTONE) 50 MG tablet TAKE 1 TABLET BY MOUTH ONCE DAILY  . tamsulosin (FLOMAX) 0.4 MG CAPS capsule Take 0.4 mg by mouth every other day.    Current Facility-Administered  Medications for the 05/28/17 encounter (Office Visit) with [name], [name], MD  Medication  . albumin human 25 % solution 12.5 g  . albumin human 25 % solution 12.5 g  . albumin  human 5 % solution 25 g   No Known Allergies Recent Results (from the past 2160 hour(s))  Lactate dehydrogenase (pleural or peritoneal fluid)     Status: Abnormal   Collection Time: 03/01/17  8:44 AM  Result Value Ref Range   LD, Fluid 28 (H) 3 - 23 U/L    Comment: (NOTE) Results should be evaluated in conjunction with serum values    Fluid Type-FLDH PERITONEAL     Comment: Performed at [facility], [address] [place], [place] [address] CORRECTED ON 03/01 AT 6045: PREVIOUSLY REPORTED AS CYTOPERI   Body fluid cell count with differential     Status: Abnormal   Collection Time: 03/01/17  8:44 AM  Result Value Ref Range   Fluid Type-FCT PERITONEAL     Comment: CORRECTED ON 03/01 AT 0933: PREVIOUSLY REPORTED AS CYTOPERI   Color, Fluid YELLOW YELLOW   Appearance, Fluid CLEAR (A) CLEAR   WBC, Fluid 151 cu mm   Neutrophil Count, Fluid 1 %   Lymphs, Fluid 53 %   Monocyte-Macrophage-Serous Fluid 46 %   Eos, Fluid 0 %   Other Cells, Fluid 0 %    Comment: Performed at [facility], [address], [place], [place] [address]  Body fluid culture     Status: None   Collection Time: 03/01/17  8:44 AM  Result Value Ref Range   Specimen Description      PERITONEAL Performed at [facility], [address] [place], [place] [address]    Special Requests      NONE Performed at [facility], [place], [place] [address]    Gram Stain      RARE WBC PRESENT,BOTH PMN AND MONONUCLEAR NO ORGANISMS SEEN    Culture      NO GROWTH 3 DAYS Performed at [facility], [place] [address] [place], [place] [address]    Report Status 03/04/2017 FINAL   Albumin, pleural or peritoneal fluid     Status: None   Collection Time: 03/01/17  8:44 AM  Result  Value Ref Range   Albumin, Fluid <1.0 g/dL    Comment: (NOTE) No normal range established for this test Results should be evaluated in conjunction with serum values    Fluid Type-FALB PERITONEAL     Comment: Performed at [facility], [address] [place], [place] [address] CORRECTED ON 03/01 AT 6962: PREVIOUSLY REPORTED AS CYTOPERI   Protein, pleural or peritoneal fluid     Status: None   Collection Time: 03/01/17  8:44 AM  Result Value Ref Range   Total protein, fluid <3.0 g/dL    Comment: (NOTE) No normal range established for this test Results should be evaluated in conjunction with serum values    Fluid Type-FTP PERITONEAL     Comment: Performed at [facility], [address] [place], [place] [address] CORRECTED ON 03/01 AT 1324: PREVIOUSLY REPORTED AS CYTOPERI   Lipase, Fluid     Status: None   Collection Time: 03/01/17  8:44 AM  Result Value Ref Range   Lipase-Fluid 30 U/L    Comment: (NOTE) INTERPRETIVE INFORMATION: Lipase, Fluid For information on body fluid reference ranges and/or interpretive guidance visit [email or web address] Test developed and characteristics determined by [facility]. See Compliance Statement B: [email or web address] Performed At: [facility] [address] [place], [place] [id number] Esau Grew MD [id number]    Source of Sample PERITONEAL     Comment: Performed at  [facility], [address], [place], [place] [address]  Pathologist smear review     Status: None   Collection Time: 03/01/17  8:44 AM  Result Value Ref Range   Path Review Cytospin of paracentesis fluid reviewed.     Comment: Negative for malignancy,. Absolute neutrophil count 2 cells per cu mm Reviewed by Dellia Nims. Reuel Derby, M.D. Performed at [facility], [address] [place], [place] [address]   Protein, body fluid (other)     Status: None   Collection Time: 03/01/17  8:44 AM  Result Value Ref  Range   Total Protein, Body Fluid Other 1.4 g/dL    Comment: (NOTE) ________________________________________________________ :  Peritoneal  :       Pleural          :   Synovial     : :______________:________________________:________________: :              : Transudate :  Exudate  :                : :______________:____________:___________:________________: :  Not Estab.  :   <3 g/dL  :  >3 g/dL  :    <2.5 g/dL   : :______________:____________:___________:________________: The method performance specifications have not been established for this test in body fluid. The test result should be integrated into the clinical context for interpretation. The method performance specifications have not been established for this test in body fluid.  The test result should be integrated into the clinical context for interpretation. Performed At: [facility] [place], [place] [id number] Rush Farmer MD [id number]    Source of Sample PERITONEAL     Comment: Performed at [facility], [address] [place], [place] [address]  Comprehensive metabolic panel     Status: Abnormal   Collection Time: 03/01/17  9:48 AM  Result Value Ref Range   Sodium 133 (L) 135 - 145 mmol/L   Potassium 4.0 3.5 - 5.1 mmol/L   Chloride 98 (L) 101 - 111 mmol/L   CO2 28 22 - 32 mmol/L   Glucose, Bld 80 65 - 99 mg/dL   BUN 18 6 - 20 mg/dL   Creatinine, Ser 1.33 (H) 0.61 - 1.24 mg/dL   Calcium 8.6 (L) 8.9 - 10.3 mg/dL   Total Protein 7.4 6.5 - 8.1 g/dL   Albumin 3.5 3.5 - 5.0 g/dL   AST 31 15 - 41 U/L   ALT 16 (L) 17 - 63 U/L   Alkaline Phosphatase 292 (H) 38 - 126 U/L   Total Bilirubin 1.1 0.3 - 1.2 mg/dL   GFR calc non Af Amer 57 (L) >60 mL/min   GFR calc Af Amer >60 >60 mL/min    Comment: (NOTE) The eGFR has been calculated using the CKD EPI equation. This calculation has not been validated in all clinical situations. eGFR's persistently <60 mL/min signify possible  Chronic Kidney Disease.    Anion gap 7 5 - 15    Comment: Performed at [facility], [address] [place],  [address]  CBC with Differential/Platelet     Status: Abnormal   Collection Time: 03/01/17  9:48 AM  Result Value Ref Range   WBC 1.8 (L) 3.8 - 10.6 K/uL   RBC 3.62 (L) 4.40 - 5.90 MIL/uL   Hemoglobin 9.6 (L) 13.0 - 18.0 g/dL   HCT 29.7 (L) 40.0 - 52.0 %   MCV 82.0 80.0 - 100.0 fL   MCH  26.4 26.0 - 34.0 pg   MCHC 32.2 32.0 - 36.0 g/dL   RDW 21.3 (H) 11.5 - 14.5 %   Platelets 133 (L) 150 - 440 K/uL   Neutrophils Relative % 54 %   Neutro Abs 1.0 (L) 1.4 - 6.5 K/uL   Lymphocytes Relative 23 %   Lymphs Abs 0.4 (L) 1.0 - 3.6 K/uL   Monocytes Relative 14 %   Monocytes Absolute 0.2 0.2 - 1.0 K/uL   Eosinophils Relative 8 %   Eosinophils Absolute 0.1 0 - 0.7 K/uL   Basophils Relative 1 %   Basophils Absolute 0.0 0 - 0.1 K/uL    Comment: Performed at [facility], [address] [place], [place] [address]  Comprehensive metabolic panel     Status: Abnormal   Collection Time: 03/06/17  1:35 PM  Result Value Ref Range   Sodium 131 (L) 135 - 145 mmol/L   Potassium 5.1 3.5 - 5.1 mmol/L   Chloride 97 (L) 101 - 111 mmol/L   CO2 27 22 - 32 mmol/L   Glucose, Bld 203 (H) 65 - 99 mg/dL   BUN 31 (H) 6 - 20 mg/dL   Creatinine, Ser 1.41 (H) 0.61 - 1.24 mg/dL   Calcium 8.5 (L) 8.9 - 10.3 mg/dL   Total Protein 6.5 6.5 - 8.1 g/dL   Albumin 2.8 (L) 3.5 - 5.0 g/dL   AST 35 15 - 41 U/L   ALT 17 17 - 63 U/L   Alkaline Phosphatase 352 (H) 38 - 126 U/L   Total Bilirubin 0.9 0.3 - 1.2 mg/dL   GFR calc non Af Amer 53 (L) >60 mL/min   GFR calc Af Amer >60 >60 mL/min    Comment: (NOTE) The eGFR has been calculated using the CKD EPI equation. This calculation has not been validated in all clinical situations. eGFR's persistently <60 mL/min signify possible Chronic Kidney Disease.    Anion gap 7 5 - 15    Comment: Performed at [facility], [address] [place], [place] [address]  CBC with Differential/Platelet     Status: Abnormal   Collection Time: 03/06/17  1:35 PM  Result Value Ref Range   WBC 2.4 (L) 3.8 - 10.6 K/uL   RBC 3.18 (L) 4.40 - 5.90 MIL/uL   Hemoglobin 8.4 (L) 13.0 - 18.0 g/dL   HCT 25.8 (L) 40.0 - 52.0 %   MCV 81.1 80.0 - 100.0 fL   MCH 26.4 26.0 - 34.0 pg   MCHC 32.6 32.0 - 36.0 g/dL   RDW 19.0 (H) 11.5 - 14.5 %   Platelets 149 (L) 150 - 440 K/uL   Neutrophils Relative % 53 %   Neutro Abs 1.3 (L) 1.4 - 6.5 K/uL   Lymphocytes Relative 23 %   Lymphs Abs 0.5 (L) 1.0 - 3.6 K/uL   Monocytes Relative 15 %   Monocytes Absolute 0.4 0.2 - 1.0 K/uL   Eosinophils Relative 8 %   Eosinophils Absolute 0.2 0 - 0.7 K/uL   Basophils Relative 1 %   Basophils Absolute 0.0 0 - 0.1 K/uL    Comment: Performed at [facility], [address] [place], [place] [address]  Alkaline phosphatase, isoenzymes     Status: Abnormal   Collection Time: 03/11/17  3:15 PM  Result Value Ref Range   Alk Phos 360 (H) 39 - 117 IU/L   Alk Phos Bone Fract 31 12 - 68 %   Alk Phos Liver Fract 68 13 -  88 %   Intestinal % 1 0 - 18 %    Comment: (NOTE) Performed At: [facility] [place], [place] [id number] Rush Farmer MD [id number] Performed at [facility], [address] [place], [place] [address]   Hepatitis B DNA, ultraquantitative, PCR     Status: None   Collection Time: 03/11/17  3:21 PM  Result Value Ref Range   HBV DNA SERPL PCR-ACNC HBV DNA not detected IU/mL   HBV DNA SERPL PCR-LOG IU UNABLE TO CALCULATE log10 IU/mL    Comment: (NOTE) Unable to calculate result since non-numeric result obtained for component test.    Test Info: Comment     Comment: (NOTE) The reportable range for this assay is 10 IU/mL to 1 billion IU/mL. Performed At: [facility] [place], [place] [id number] Rush Farmer MD [id number] Performed at [facility], [address] [place], [place] [address]   Basic metabolic panel     Status: Abnormal   Collection Time: 03/11/17  3:21 PM  Result Value Ref Range   Sodium 128 (L) 135 - 145 mmol/L   Potassium 4.3 3.5 - 5.1 mmol/L   Chloride 92 (L) 101 - 111 mmol/L   CO2 27 22 - 32 mmol/L   Glucose, Bld 317 (H) 65 - 99 mg/dL   BUN 26 (H) 6 - 20 mg/dL   Creatinine, Ser 1.44 (H) 0.61 - 1.24 mg/dL   Calcium 8.1 (L) 8.9 - 10.3 mg/dL   GFR calc non Af Amer 51 (L) >60 mL/min   GFR calc Af Amer 60 (L) >60 mL/min    Comment: (NOTE) The eGFR has been calculated using the CKD EPI equation. This calculation has not been validated in all clinical situations. eGFR's persistently <60 mL/min signify possible Chronic Kidney Disease.    Anion gap 9 5 - 15    Comment: Performed at [facility], [address] [place], [place] [address]  Miscellaneous [facility] test (send-out)     Status: None   Collection Time: 03/11/17  3:21 PM  Result Value Ref Range   [facility] test code [id number]    [facility] test name HEPATITIS DELTA VIRUS ANTIGEN    Misc [facility] result COMMENT     Comment: (NOTE) Test Ordered: [id number] Hepatitis D Antigen Hepatitis D Antigen            Positive    Linda.Low ]          E=     Reference Range: Negative                              Interpretation: Negative:     Antigen not detected Equivocal:     Antigen may or may not be present Positive:     Antigen detected Repeat testing is recommended for equivocal results with a sample collected 7-14 days after initial sample collection. The performance characteristics of the listed assay were validated by [employer] The [place] FDA has not approved or cleared this test. The results of this assay can be used for clinical diagnosis without FDA approval. [employer] is a [employer] representative, CAP accredited laboratory for performing high complexity assays such as this one. Testing Performed at: [facility] [address] [place] [address] Performed At: [facility] [place], [place] [id number] Rush Farmer MD [id number] Performe d At: [facility] [employer] [facility] [place], [place] [id number] Tinghitella Gwen Her PhD [id number] Performed at [facility], [address] [place], [place] [address]   Comprehensive metabolic panel     Status: Abnormal   Collection Time: 03/20/17 10:05 AM  Result Value Ref Range   Sodium 129 (L) 135 - 145 mmol/L   Potassium 4.0 3.5 - 5.1 mmol/L   Chloride 92 (L) 101 - 111 mmol/L   CO2 29 22 - 32 mmol/L   Glucose, Bld 97 65 - 99 mg/dL   BUN 28 (H) 6 - 20 mg/dL   Creatinine, Ser 1.35 (H) 0.61 - 1.24 mg/dL   Calcium 8.6 (L) 8.9 - 10.3 mg/dL   Total Protein 6.9 6.5 - 8.1 g/dL   Albumin 2.8 (L) 3.5 - 5.0 g/dL   AST 37 15 - 41 U/L   ALT 17 17 - 63 U/L   Alkaline Phosphatase 341 (H) 38 - 126 U/L   Total Bilirubin 1.5 (H) 0.3 - 1.2 mg/dL   GFR calc non Af Amer 56 (L) >60 mL/min   GFR calc Af Amer >60 >60 mL/min    Comment: (NOTE) The eGFR has been calculated using the CKD EPI equation. This calculation has not been validated in all clinical situations. eGFR's persistently <60 mL/min signify possible Chronic Kidney Disease.    Anion gap 8 5 - 15    Comment: Performed at [facility] [address] [place], [place] [address]  CBC with Differential/Platelet     Status: Abnormal   Collection Time: 03/20/17 10:05 AM  Result Value Ref Range   WBC 2.5 (L) 3.8 - 10.6 K/uL   RBC 3.27 (L) 4.40 - 5.90 MIL/uL   Hemoglobin 8.1 (L) 13.0 - 18.0 g/dL   HCT 25.3 (L) 40.0 - 52.0 %   MCV 77.2 (L) 80.0 - 100.0 fL   MCH 24.7 (L) 26.0 - 34.0 pg   MCHC 32.0 32.0 - 36.0 g/dL   RDW 17.6 (H) 11.5 - 14.5 %   Platelets 114 (L) 150 - 440 K/uL   Neutrophils Relative % 47 %   Neutro Abs 1.2 (L) 1.4 - 6.5 K/uL   Lymphocytes Relative 25 %   Lymphs Abs 0.6 (L) 1.0 - 3.6 K/uL   Monocytes Relative 17 %   Monocytes Absolute 0.4 0.2 -  1.0 K/uL   Eosinophils Relative 10 %   Eosinophils Absolute 0.2 0 - 0.7 K/uL   Basophils Relative 1 %   Basophils Absolute 0.0 0 - 0.1 K/uL    Comment: Performed at [facility], [address] [place], [place] [address]  Hepatitis B surface antigen     Status: None   Collection Time: 03/22/17  2:30 PM  Result Value Ref Range   Hepatitis B Surface Ag Negative Negative    Comment: (NOTE) Performed At: [facility] [place], [place] [id number] Rush Farmer MD [id number] Performed at [facility], [address] [place],  [address]   Ferritin     Status: Abnormal   Collection Time: 04/03/17  1:33 PM  Result Value Ref Range   Ferritin 17 (L) 24 - 336 ng/mL    Comment: Performed at [facility], [address] [place], [place] [address]  Iron and TIBC     Status: Abnormal   Collection Time: 04/03/17  1:33 PM  Result Value Ref Range   Iron 13 (L) 45 - 182 ug/dL   TIBC 345 250 - 450 ug/dL   Saturation Ratios  4 (L) 17.9 - 39.5 %   UIBC 332 ug/dL    Comment: Performed at [facility], [address] [place], [place] [address]  Comprehensive metabolic panel     Status: Abnormal   Collection Time: 04/03/17  1:33 PM  Result Value Ref Range   Sodium 130 (L) 135 - 145 mmol/L   Potassium 4.3 3.5 - 5.1 mmol/L   Chloride 95 (L) 101 - 111 mmol/L   CO2 25 22 - 32 mmol/L   Glucose, Bld 175 (H) 65 - 99 mg/dL   BUN 20 6 - 20 mg/dL   Creatinine, Ser 1.40 (H) 0.61 - 1.24 mg/dL   Calcium 8.5 (L) 8.9 - 10.3 mg/dL   Total Protein 6.9 6.5 - 8.1 g/dL   Albumin 2.9 (L) 3.5 - 5.0 g/dL   AST 40 15 - 41 U/L   ALT 20 17 - 63 U/L   Alkaline Phosphatase 328 (H) 38 - 126 U/L   Total Bilirubin 1.8 (H) 0.3 - 1.2 mg/dL   GFR calc non Af Amer 53 (L) >60 mL/min   GFR calc Af Amer >60 >60 mL/min    Comment: (NOTE) The eGFR has been calculated using the CKD EPI equation. This calculation has not been validated in all clinical  situations. eGFR's persistently <60 mL/min signify possible Chronic Kidney Disease.    Anion gap 10 5 - 15    Comment: Performed at [facility], [address] [place], [place] [address]  CBC with Differential/Platelet     Status: Abnormal   Collection Time: 04/03/17  1:33 PM  Result Value Ref Range   WBC 3.0 (L) 3.8 - 10.6 K/uL   RBC 3.47 (L) 4.40 - 5.90 MIL/uL   Hemoglobin 8.3 (L) 13.0 - 18.0 g/dL   HCT 26.3 (L) 40.0 - 52.0 %   MCV 75.6 (L) 80.0 - 100.0 fL   MCH 23.9 (L) 26.0 - 34.0 pg   MCHC 31.6 (L) 32.0 - 36.0 g/dL   RDW 17.2 (H) 11.5 - 14.5 %   Platelets 142 (L) 150 - 440 K/uL   Neutrophils Relative % 63 %   Neutro Abs 1.9 1.4 - 6.5 K/uL   Lymphocytes Relative 18 %   Lymphs Abs 0.5 (L) 1.0 - 3.6 K/uL   Monocytes Relative 10 %   Monocytes Absolute 0.3 0.2 - 1.0 K/uL   Eosinophils Relative 8 %   Eosinophils Absolute 0.2 0 - 0.7 K/uL   Basophils Relative 1 %   Basophils Absolute 0.0 0 - 0.1 K/uL    Comment: Performed at [facility], [address] [place], [place] [address]  Glucose, capillary     Status: Abnormal   Collection Time: 04/15/17  8:20 AM  Result Value Ref Range   Glucose-Capillary 378 (H) 65 - 99 mg/dL  CBC with Differential/Platelet     Status: Abnormal   Collection Time: 04/17/17 10:23 AM  Result Value Ref Range   WBC 2.8 (L) 3.8 - 10.6 K/uL   RBC 3.88 (L) 4.40 - 5.90 MIL/uL   Hemoglobin 8.8 (L) 13.0 - 18.0 g/dL   HCT 28.1 (L) 40.0 - 52.0 %   MCV 72.4 (L) 80.0 - 100.0 fL   MCH 22.7 (L) 26.0 - 34.0 pg   MCHC 31.4 (L) 32.0 - 36.0 g/dL   RDW 16.6 (H) 11.5 - 14.5 %   Platelets 151 150 - 440 K/uL   Neutrophils Relative % 57 %   Neutro Abs 1.6 1.4 - 6.5  K/uL   Lymphocytes Relative 20 %   Lymphs Abs 0.5 (L) 1.0 - 3.6 K/uL   Monocytes Relative 15 %   Monocytes Absolute 0.4 0.2 - 1.0 K/uL   Eosinophils Relative 8 %   Eosinophils Absolute 0.2 0 - 0.7 K/uL   Basophils Relative 0 %   Basophils Absolute 0.0 0 - 0.1 K/uL    Comment:  Performed at [facility], [address] [place], [place] [address]  Comprehensive metabolic panel     Status: Abnormal   Collection Time: 04/17/17 10:23 AM  Result Value Ref Range   Sodium 131 (L) 135 - 145 mmol/L   Potassium 4.0 3.5 - 5.1 mmol/L   Chloride 93 (L) 101 - 111 mmol/L   CO2 28 22 - 32 mmol/L   Glucose, Bld 97 65 - 99 mg/dL   BUN 16 6 - 20 mg/dL   Creatinine, Ser 1.31 (H) 0.61 - 1.24 mg/dL   Calcium 9.0 8.9 - 10.3 mg/dL   Total Protein 7.5 6.5 - 8.1 g/dL   Albumin 3.2 (L) 3.5 - 5.0 g/dL   AST 32 15 - 41 U/L   ALT 18 17 - 63 U/L   Alkaline Phosphatase 349 (H) 38 - 126 U/L   Total Bilirubin 1.6 (H) 0.3 - 1.2 mg/dL   GFR calc non Af Amer 58 (L) >60 mL/min   GFR calc Af Amer >60 >60 mL/min    Comment: (NOTE) The eGFR has been calculated using the CKD EPI equation. This calculation has not been validated in all clinical situations. eGFR's persistently <60 mL/min signify possible Chronic Kidney Disease.    Anion gap 10 5 - 15    Comment: Performed at [facility], [address] [place], [place] [address]  CBC with Differential/Platelet     Status: Abnormal   Collection Time: 05/01/17  9:06 AM  Result Value Ref Range   WBC 2.3 (L) 3.8 - 10.6 K/uL   RBC 3.96 (L) 4.40 - 5.90 MIL/uL   Hemoglobin 9.8 (L) 13.0 - 18.0 g/dL   HCT 30.3 (L) 40.0 - 52.0 %   MCV 76.4 (L) 80.0 - 100.0 fL   MCH 24.8 (L) 26.0 - 34.0 pg   MCHC 32.5 32.0 - 36.0 g/dL   RDW 23.0 (H) 11.5 - 14.5 %   Platelets 102 (L) 150 - 440 K/uL   Neutrophils Relative % 61 %   Neutro Abs 1.5 1.4 - 6.5 K/uL   Lymphocytes Relative 22 %   Lymphs Abs 0.5 (L) 1.0 - 3.6 K/uL   Monocytes Relative 13 %   Monocytes Absolute 0.3 0.2 - 1.0 K/uL   Eosinophils Relative 3 %   Eosinophils Absolute 0.1 0 - 0.7 K/uL   Basophils Relative 1 %   Basophils Absolute 0.0 0 - 0.1 K/uL    Comment: Performed at [facility], [address] [place], [place] [address]  Comprehensive metabolic panel     Status:  Abnormal   Collection Time: 05/01/17  9:06 AM  Result Value Ref Range   Sodium 131 (L) 135 - 145 mmol/L   Potassium 4.1 3.5 - 5.1 mmol/L   Chloride 93 (L) 101 - 111 mmol/L   CO2 28 22 - 32 mmol/L   Glucose, Bld 138 (H) 65 - 99 mg/dL   BUN 12 6 - 20 mg/dL   Creatinine, Ser 1.22 0.61 - 1.24 mg/dL   Calcium 8.5 (L) 8.9 - 10.3 mg/dL   Total Protein 6.8 6.5 -  8.1 g/dL   Albumin 2.9 (L) 3.5 - 5.0 g/dL   AST 40 15 - 41 U/L   ALT 18 17 - 63 U/L   Alkaline Phosphatase 378 (H) 38 - 126 U/L   Total Bilirubin 1.4 (H) 0.3 - 1.2 mg/dL   GFR calc non Af Amer >60 >60 mL/min   GFR calc Af Amer >60 >60 mL/min    Comment: (NOTE) The eGFR has been calculated using the CKD EPI equation. This calculation has not been validated in all clinical situations. eGFR's persistently <60 mL/min signify possible Chronic Kidney Disease.    Anion gap 10 5 - 15    Comment: Performed at [facility], [address] [place], [place] [address]  Uric acid     Status: Abnormal   Collection Time: 05/08/17  2:19 PM  Result Value Ref Range   Uric Acid, Serum 9.9 (H) 4.0 - 8.0 mg/dL    Comment: Therapeutic target for gout patients: <6.0 mg/dL .   Vitamin D (25 hydroxy)     Status: None   Collection Time: 05/08/17  2:19 PM  Result Value Ref Range   Vit D, 25-Hydroxy 35 30 - 100 ng/mL    Comment: Vitamin D Status         25-OH Vitamin D: . Deficiency:                    <20 ng/mL Insufficiency:             20 - 29 ng/mL Optimal:                 > or = 30 ng/mL . For 25-OH Vitamin D testing on patients on  D2-supplementation and patients for whom quantitation  of D2 and D3 fractions is required, the QuestAssureD(TM) 25-OH VIT D, (D2,D3), LC/MS/MS is recommended: order  code [id number] (patients >71yr). . For more information on this test, go to: http://education.questdiagnostics.com/faq/FAQ163 (This link is being provided for  informational/educational purposes only.)   PSA     Status: None   Collection  Time: 05/08/17  2:19 PM  Result Value Ref Range   PSA <0.1 < OR = 4.0 ng/mL    Comment: The total PSA value from this assay system is  standardized against the WHO standard. The test  result will be approximately 20% lower when compared  to the equimolar-standardized total PSA (Beckman  Coulter). Comparison of serial PSA results should be  interpreted with this fact in mind. . This test was performed using the Siemens  chemiluminescent method. Values obtained from  different assay methods cannot be used interchangeably. PSA levels, regardless of value, should not be interpreted as absolute evidence of the presence or absence of disease.   TSH     Status: Abnormal   Collection Time: 05/08/17  2:19 PM  Result Value Ref Range   TSH 7.45 (H) 0.40 - 4.50 mIU/L  Hemoglobin A1c     Status: None   Collection Time: 05/08/17  2:19 PM  Result Value Ref Range   Hgb A1c MFr Bld      Comment:    Lipid panel     Status: Abnormal   Collection Time: 05/08/17  2:19 PM  Result Value Ref Range   Cholesterol 100 <200 mg/dL   HDL 34 (L) >40 mg/dL   Triglycerides 95 <150 mg/dL   LDL Cholesterol (Calc) 48 mg/dL (calc)    Comment: Reference range: <100 . Desirable range <100 mg/dL for primary  prevention;   <70 mg/dL for patients with CHD or diabetic patients  with > or = 2 CHD risk factors. Marland Kitchen LDL-C is now calculated using the Martin-Hopkins  calculation, which is a validated novel method providing  better accuracy than the Friedewald equation in the  estimation of LDL-C.  Cresenciano Genre et al. Annamaria Helling. 6160;737(10): 2061-2068  (http://education.[facility].com/faq/FAQ164)    Total CHOL/HDL Ratio 2.9 <5.0 (calc)   Non-HDL Cholesterol (Calc) 66 <130 mg/dL (calc)    Comment: For patients with diabetes plus 1 major ASCVD risk  factor, treating to a non-HDL-C goal of <100 mg/dL  (LDL-C of <70 mg/dL) is considered a therapeutic  option.   Thyroid peroxidase antibody     Status: None   Collection  Time: 05/08/17  2:19 PM  Result Value Ref Range   Thyroperoxidase Ab SerPl-aCnc 1 <9 IU/mL  Hepatitis B surface antigen     Status: None   Collection Time: 05/08/17  3:40 PM  Result Value Ref Range   Hepatitis B Surface Ag Negative Negative  Hepatitis D qRT-PCR (plasma)     Status: None   Collection Time: 05/08/17  3:40 PM  Result Value Ref Range   HDV qRT-PCR (plasma) Not Detected Not Detected    Comment: This test was developed and its performance characteristics determined by [employer]. It has not been cleared or approved by the U.S. Food and Drug Administration.  Results should be used in conjunction with clinical findings, and should not form the sole basis for a diagnosis or treatment decision.   Comprehensive metabolic panel     Status: Abnormal   Collection Time: 05/15/17  9:14 AM  Result Value Ref Range   Sodium 129 (L) 135 - 145 mmol/L   Potassium 3.8 3.5 - 5.1 mmol/L   Chloride 89 (L) 101 - 111 mmol/L   CO2 32 22 - 32 mmol/L   Glucose, Bld 245 (H) 65 - 99 mg/dL   BUN 15 6 - 20 mg/dL   Creatinine, Ser 1.26 (H) 0.61 - 1.24 mg/dL   Calcium 8.5 (L) 8.9 - 10.3 mg/dL   Total Protein 7.1 6.5 - 8.1 g/dL   Albumin 3.0 (L) 3.5 - 5.0 g/dL   AST 37 15 - 41 U/L   ALT 21 17 - 63 U/L   Alkaline Phosphatase 401 (H) 38 - 126 U/L   Total Bilirubin 2.3 (H) 0.3 - 1.2 mg/dL   GFR calc non Af Amer >60 >60 mL/min   GFR calc Af Amer >60 >60 mL/min    Comment: (NOTE) The eGFR has been calculated using the CKD EPI equation. This calculation has not been validated in all clinical situations. eGFR's persistently <60 mL/min signify possible Chronic Kidney Disease.    Anion gap 8 5 - 15    Comment: Performed at [facility], [address] [place], [place] [address]  CBC with Differential/Platelet     Status: Abnormal   Collection Time: 05/15/17  9:14 AM  Result Value Ref Range   WBC 2.8 (L) 3.8 - 10.6 K/uL   RBC 3.97 (L) 4.40 - 5.90 MIL/uL   Hemoglobin 10.9 (L) 13.0 -  18.0 g/dL   HCT 32.8 (L) 40.0 - 52.0 %   MCV 82.6 80.0 - 100.0 fL   MCH 27.4 26.0 - 34.0 pg   MCHC 33.2 32.0 - 36.0 g/dL   RDW 27.1 (H) 11.5 - 14.5 %   Platelets 110 (L) 150 - 440 K/uL   Neutrophils Relative % 58 %  Neutro Abs 1.6 1.4 - 6.5 K/uL   Lymphocytes Relative 22 %   Lymphs Abs 0.6 (L) 1.0 - 3.6 K/uL   Monocytes Relative 12 %   Monocytes Absolute 0.3 0.2 - 1.0 K/uL   Eosinophils Relative 6 %   Eosinophils Absolute 0.2 0 - 0.7 K/uL   Basophils Relative 2 %   Basophils Absolute 0.0 0 - 0.1 K/uL    Comment: Performed at [facility], [address] [place], [place] [address]   Objective  Body mass index is 23.12 kg/m. Wt Readings from Last 3 Encounters:  05/28/17 165 lb 12.8 oz (75.2 kg)  05/15/17 165 lb 4.8 oz (75 kg)  05/06/17 167 lb 12.8 oz (76.1 kg)   Temp Readings from Last 3 Encounters:  05/28/17 99.8 F (37.7 C) (Oral)  05/15/17 (!) 97.2 F (36.2 C) (Tympanic)  05/01/17 98.7 F (37.1 C) (Tympanic)   BP Readings from Last 3 Encounters:  05/28/17 116/70  05/15/17 118/76  05/06/17 126/83   Pulse Readings from Last 3 Encounters:  05/28/17 73  05/15/17 71  05/06/17 72    Physical Exam  Constitutional: He is oriented to person, place, and time. Vital signs are normal. He appears well-developed and well-nourished. He is cooperative.  HENT:  Head: Normocephalic and atraumatic.  Mouth/Throat: Oropharynx is clear and moist and mucous membranes are normal.  Eyes: Pupils are equal, round, and reactive to light. Conjunctivae are normal.  Cardiovascular: Normal rate, regular rhythm and normal heart sounds.  Pulmonary/Chest: Effort normal. He has wheezes.  Neurological: He is alert and oriented to person, place, and time. Gait normal.  Skin: Skin is warm, dry and intact.  Psychiatric: He has a normal mood and affect. His speech is normal and behavior is normal. Judgment and thought content normal. Cognition and memory are normal.  Nursing note and vitals  reviewed.   Assessment   1. Cough and bronchitis  2. DM 2 with hyperglycemia and mod DR A1C today 8.3  3. Cirrhosis with hyponatremia 4. Gout 5. HM Plan   1. Doxy bid x 1 week  Prn albuterol and muciniex  2. Refer Dr. Valda Favia in [facility]  Increase levemir to 40 units  3. F/u Dr. Vicente Males this week Disc salt intake in moderation monitor sodium 4. Pt to try allopurinol with colchicine x 3 months then allopurinol alone  Reviewed AE with both drugs today  5.  Had flu shot  Tdap per pt had ~ 5 years ago  pna 23 01/28/15 and prevnar 11/30/13  Disc shingrix in future   a1C today  TSH need to monitor will not treat for now  Former smoker  PSA neg  Colonoscopy get records [facility] in [place] release signed today      Provider: Dr. Olivia Mackie [name]-[facility] Medicine

## 2017-05-28 NOTE — Patient Instructions (Addendum)
Take allopurinol and colchicine x 3 months then only allopurinol  Follow up in 3-4 months   Cirrhosis Cirrhosis is long-term (chronic) liver injury. The liver is your largest internal organ, and it performs many functions. The liver converts food into energy, removes toxic material from your blood, makes important proteins, and absorbs necessary vitamins from your diet. If you have cirrhosis, it means many of your healthy liver cells have been replaced by scar tissue. This prevents blood from flowing through your liver, which makes it difficult for your liver to function. This scarring is not reversible, but treatment can prevent it from getting worse. What are the causes? Hepatitis C and long-term alcohol abuse are the most common causes of cirrhosis. Other causes include:  Nonalcoholic fatty liver disease.  Hepatitis B infection.  Autoimmune hepatitis.  Diseases that cause blockage of ducts inside the liver.  Inherited liver diseases.  Reactions to certain long-term medicines.  Parasitic infections.  Long-term exposure to certain toxins.  What increases the risk? You may have a higher risk of cirrhosis if you:  Have certain hepatitis viruses.  Abuse alcohol, especially if you are male.  Are overweight.  Share needles.  Have unprotected sex with someone who has hepatitis.  What are the signs or symptoms? You may not have any signs and symptoms at first. Symptoms may not develop until the damage to your liver starts to get worse. Signs and symptoms of cirrhosis may include:  Tenderness in the right-upper part of your abdomen.  Weakness and tiredness (fatigue).  Loss of appetite.  Nausea.  Weight loss and muscle loss.  Itchiness.  Yellow skin and eyes (jaundice).  Buildup of fluid in the abdomen (ascites).  Swelling of the feet and ankles (edema).  Appearance of tiny blood vessels under the skin.  Mental confusion.  Easy bruising and bleeding.  How  is this diagnosed? Your health care provider may suspect cirrhosis based on your symptoms and medical history, especially if you have other medical conditions or a history of alcohol abuse. Your health care provider will do a physical exam to feel your liver and check for signs of cirrhosis. Your health care provider may perform other tests, including:  Blood tests to check: ? Whether you have hepatitis B or C. ? Kidney function. ? Liver function.  Imaging tests such as: ? MRI or CT scan to look for changes seen in advanced cirrhosis. ? Ultrasound to see if normal liver tissue is being replaced by scar tissue.  A procedure using a long needle to take a sample of liver tissue (biopsy) for examination under a microscope. Liver biopsy can confirm the diagnosis of cirrhosis.  How is this treated? Treatment depends on how damaged your liver is and what caused the damage. Treatment may include treating cirrhosis symptoms or treating the underlying causes of the condition to try to slow the progression of the damage. Treatment may include:  Making lifestyle changes, such as: ? Eating a healthy diet. ? Restricting salt intake. ? Maintaining a healthy weight. ? Not abusing drugs or alcohol.  Taking medicines to: ? Treat liver infections or other infections. ? Control itching. ? Reduce fluid buildup. ? Reduce certain blood toxins. ? Reduce risk of bleeding from enlarged blood vessels in the stomach or esophagus (varices).  If varices are causing bleeding problems, you may need treatment with a procedure that ties up the vessels causing them to fall off (band ligation).  If cirrhosis is causing your liver to fail, your  health care provider may recommend a liver transplant.  Other treatments may be recommended depending on any complications of cirrhosis, such as liver-related kidney failure (hepatorenal syndrome).  Follow these instructions at home:  Take medicines only as directed by your  health care provider. Do not use drugs that are toxic to your liver. Ask your health care provider before taking any new medicines, including over-the-counter medicines.  Rest as needed.  Eat a well-balanced diet. Ask your health care provider or dietitian for more information.  You may have to follow a low-salt diet or restrict your water intake as directed.  Do not drink alcohol. This is especially important if you are taking acetaminophen.  Keep all follow-up visits as directed by your health care provider. This is important. Contact a health care provider if:  You have fatigue or weakness that is getting worse.  You develop swelling of the hands, feet, legs, or face.  You have a fever.  You develop loss of appetite.  You have nausea or vomiting.  You develop jaundice.  You develop easy bruising or bleeding. Get help right away if:  You vomit bright red blood or a material that looks like coffee grounds.  You have blood in your stools.  Your stools appear black and tarry.  You become confused.  You have chest pain or trouble breathing. This information is not intended to replace advice given to you by your health care provider. Make sure you discuss any questions you have with your health care provider. Document Released: 12/18/2004 Document Revised: 04/28/2015 Document Reviewed: 08/26/2013 Elsevier Interactive Patient Education  2018 [place] are compounds that affect the level of uric acid in your body. A low-purine diet is a diet that is low in purines. Eating a low-purine diet can prevent the level of uric acid in your body from getting too high and causing gout or kidney stones or both. What do I need to know about this diet?  Choose low-purine foods. Examples of low-purine foods are listed in the next section.  Drink plenty of fluids, especially water. Fluids can help remove uric acid from your body. Try to drink 8-16 cups  (1.9-3.8 L) a day.  Limit foods high in fat, especially saturated fat, as fat makes it harder for the body to get rid of uric acid. Foods high in saturated fat include pizza, cheese, ice cream, whole milk, fried foods, and gravies. Choose foods that are lower in fat and lean sources of protein. Use olive oil when cooking as it contains healthy fats that are not high in saturated fat.  Limit alcohol. Alcohol interferes with the elimination of uric acid from your body. If you are having a gout attack, avoid all alcohol.  Keep in mind that different people's bodies react differently to different foods. You will probably learn over time which foods do or do not affect you. If you discover that a food tends to cause your gout to flare up, avoid eating that food. You can more freely enjoy foods that do not cause problems. If you have any questions about a food item, talk to your dietitian or health care provider. Which foods are low, moderate, and high in purines? The following is a list of foods that are low, moderate, and high in purines. You can eat any amount of the foods that are low in purines. You may be able to have small amounts of foods that are moderate in purines. Ask your  health care provider how much of a food moderate in purines you can have. Avoid foods high in purines. Grains  Foods low in purines: Enriched white bread, pasta, rice, cake, cornbread, popcorn.  Foods moderate in purines: Whole-grain breads and cereals, wheat germ, bran, oatmeal. Uncooked oatmeal. Dry wheat bran or wheat germ.  Foods high in purines: Pancakes, [place] toast, biscuits, muffins. Vegetables  Foods low in purines: All vegetables, except those that are moderate in purines.  Foods moderate in purines: Asparagus, cauliflower, spinach, mushrooms, green peas. Fruits  All fruits are low in purines. Meats and other Protein Foods  Foods low in purines: Eggs, nuts, peanut butter.  Foods moderate in purines:  80-90% lean beef, lamb, veal, pork, poultry, fish, eggs, peanut butter, nuts. Crab, lobster, oysters, and shrimp. Cooked dried beans, peas, and lentils.  Foods high in purines: Anchovies, sardines, herring, mussels, tuna, codfish, scallops, trout, and haddock. Berniece Salines. Organ meats (such as liver or kidney). Tripe. Game meat. Goose. Sweetbreads. Dairy  All dairy foods are low in purines. Low-fat and fat-free dairy products are best because they are low in saturated fat. Beverages  Drinks low in purines: Water, carbonated beverages, tea, coffee, cocoa.  Drinks moderate in purines: Soft drinks and other drinks sweetened with high-fructose corn syrup. Juices. To find whether a food or drink is sweetened with high-fructose corn syrup, look at the ingredients list.  Drinks high in purines: Alcoholic beverages (such as beer). Condiments  Foods low in purines: Salt, herbs, olives, pickles, relishes, vinegar.  Foods moderate in purines: Butter, margarine, oils, mayonnaise. Fats and Oils  Foods low in purines: All types, except gravies and sauces made with meat.  Foods high in purines: Gravies and sauces made with meat. Other Foods  Foods low in purines: Sugars, sweets, gelatin. Cake. Soups made without meat.  Foods moderate in purines: Meat-based or fish-based soups, broths, or bouillons. Foods and drinks sweetened with high-fructose corn syrup.  Foods high in purines: High-fat desserts (such as ice cream, cookies, cakes, pies, doughnuts, and chocolate). Contact your dietitian for more information on foods that are not listed here. This information is not intended to replace advice given to you by your health care provider. Make sure you discuss any questions you have with your health care provider. Document Released: 04/14/2010 Document Revised: 05/26/2015 Document Reviewed: 11/24/2012 Elsevier Interactive Patient Education  2017 [employer].  Gout Gout is painful swelling that can occur  in some of your joints. Gout is a type of arthritis. This condition is caused by having too much uric acid in your body. Uric acid is a chemical that forms when your body breaks down substances called purines. Purines are important for building body proteins. When your body has too much uric acid, sharp crystals can form and build up inside your joints. This causes pain and swelling. Gout attacks can happen quickly and be very painful (acute gout). Over time, the attacks can affect more joints and become more frequent (chronic gout). Gout can also cause uric acid to build up under your skin and inside your kidneys. What are the causes? This condition is caused by too much uric acid in your blood. This can occur because:  Your kidneys do not remove enough uric acid from your blood. This is the most common cause.  Your body makes too much uric acid. This can occur with some cancers and cancer treatments. It can also occur if your body is breaking down too many red blood cells (hemolytic anemia).  You eat too many foods that are high in purines. These foods include organ meats and some seafood. Alcohol, especially beer, is also high in purines.  A gout attack may be triggered by trauma or stress. What increases the risk? This condition is more likely to develop in people who:  Have a family history of gout.  Are male and middle-aged.  Are male and have gone through menopause.  Are obese.  Frequently drink alcohol, especially beer.  Are dehydrated.  Lose weight too quickly.  Have an organ transplant.  Have lead poisoning.  Take certain medicines, including aspirin, cyclosporine, diuretics, levodopa, and niacin.  Have kidney disease or psoriasis.  What are the signs or symptoms? An attack of acute gout happens quickly. It usually occurs in just one joint. The most common place is the big toe. Attacks often start at night. Other joints that may be affected include joints of the  feet, ankle, knee, fingers, wrist, or elbow. Symptoms may include:  Severe pain.  Warmth.  Swelling.  Stiffness.  Tenderness. The affected joint may be very painful to touch.  Shiny, red, or purple skin.  Chills and fever.  Chronic gout may cause symptoms more frequently. More joints may be involved. You may also have white or yellow lumps (tophi) on your hands or feet or in other areas near your joints. How is this diagnosed? This condition is diagnosed based on your symptoms, medical history, and physical exam. You may have tests, such as:  Blood tests to measure uric acid levels.  Removal of joint fluid with a needle (aspiration) to look for uric acid crystals.  X-rays to look for joint damage.  How is this treated? Treatment for this condition has two phases: treating an acute attack and preventing future attacks. Acute gout treatment may include medicines to reduce pain and swelling, including:  NSAIDs.  Steroids. These are strong anti-inflammatory medicines that can be taken by mouth (orally) or injected into a joint.  Colchicine. This medicine relieves pain and swelling when it is taken soon after an attack. It can be given orally or through an IV tube.  Preventive treatment may include:  Daily use of smaller doses of NSAIDs or colchicine.  Use of a medicine that reduces uric acid levels in your blood.  Changes to your diet. You may need to see a specialist about healthy eating (dietitian).  Follow these instructions at home: During a Gout Attack  If directed, apply ice to the affected area: ? Put ice in a plastic bag. ? Place a towel between your skin and the bag. ? Leave the ice on for 20 minutes, 2-3 times a day.  Rest the joint as much as possible. If the affected joint is in your leg, you may be given crutches to use.  Raise (elevate) the affected joint above the level of your heart as often as possible.  Drink enough fluids to keep your urine clear  or pale yellow.  Take over-the-counter and prescription medicines only as told by your health care provider.  Do not drive or operate heavy machinery while taking prescription pain medicine.  Follow instructions from your health care provider about eating or drinking restrictions.  Return to your normal activities as told by your health care provider. Ask your health care provider what activities are safe for you. Avoiding Future Gout Attacks  Follow a low-purine diet as told by your dietitian or health care provider. Avoid foods and drinks that are high in purines,  including liver, kidney, anchovies, asparagus, herring, mushrooms, mussels, and beer.  Limit alcohol intake to no more than 1 drink a day for nonpregnant women and 2 drinks a day for men. One drink equals 12 oz of beer, 5 oz of wine, or 1 oz of hard liquor.  Maintain a healthy weight or lose weight if you are overweight. If you want to lose weight, talk with your health care provider. It is important that you do not lose weight too quickly.  Start or maintain an exercise program as told by your health care provider.  Drink enough fluids to keep your urine clear or pale yellow.  Take over-the-counter and prescription medicines only as told by your health care provider.  Keep all follow-up visits as told by your health care provider. This is important. Contact a health care provider if:  You have another gout attack.  You continue to have symptoms of a gout attack after10 days of treatment.  You have side effects from your medicines.  You have chills or a fever.  You have burning pain when you urinate.  You have pain in your lower back or belly. Get help right away if:  You have severe or uncontrolled pain.  You cannot urinate. This information is not intended to replace advice given to you by your health care provider. Make sure you discuss any questions you have with your health care provider. Document  Released: 12/16/1999 Document Revised: 05/26/2015 Document Reviewed: 09/30/2014 Elsevier Interactive Patient Education  [employer].

## 2017-05-28 NOTE — Progress Notes (Signed)
Pre visit review using our clinic review tool, if applicable. No additional management support is needed unless otherwise documented below in the visit note. 

## 2017-05-29 ENCOUNTER — Inpatient Hospital Stay: Payer: Medicare HMO

## 2017-05-29 VITALS — BP 118/75 | HR 69 | Temp 99.3°F | Resp 20 | Wt 165.0 lb

## 2017-05-29 DIAGNOSIS — Z5111 Encounter for antineoplastic chemotherapy: Secondary | ICD-10-CM | POA: Diagnosis not present

## 2017-05-29 DIAGNOSIS — C649 Malignant neoplasm of unspecified kidney, except renal pelvis: Secondary | ICD-10-CM

## 2017-05-29 DIAGNOSIS — C7802 Secondary malignant neoplasm of left lung: Principal | ICD-10-CM

## 2017-05-29 LAB — CBC WITH DIFFERENTIAL/PLATELET
BASOS ABS: 0 10*3/uL (ref 0–0.1)
BASOS PCT: 2 %
EOS ABS: 0.2 10*3/uL (ref 0–0.7)
EOS PCT: 10 %
HCT: 32.5 % — ABNORMAL LOW (ref 40.0–52.0)
Hemoglobin: 11.1 g/dL — ABNORMAL LOW (ref 13.0–18.0)
LYMPHS PCT: 29 %
Lymphs Abs: 0.5 10*3/uL — ABNORMAL LOW (ref 1.0–3.6)
MCH: 29.3 pg (ref 26.0–34.0)
MCHC: 34.1 g/dL (ref 32.0–36.0)
MCV: 86 fL (ref 80.0–100.0)
MONO ABS: 0.4 10*3/uL (ref 0.2–1.0)
Monocytes Relative: 22 %
Neutro Abs: 0.7 10*3/uL — ABNORMAL LOW (ref 1.4–6.5)
Neutrophils Relative %: 37 %
PLATELETS: 134 10*3/uL — AB (ref 150–440)
RBC: 3.78 MIL/uL — ABNORMAL LOW (ref 4.40–5.90)
RDW: 22.8 % — AB (ref 11.5–14.5)
WBC: 1.9 10*3/uL — AB (ref 3.8–10.6)

## 2017-05-29 LAB — COMPREHENSIVE METABOLIC PANEL
ALT: 25 U/L (ref 17–63)
AST: 60 U/L — AB (ref 15–41)
Albumin: 3.1 g/dL — ABNORMAL LOW (ref 3.5–5.0)
Alkaline Phosphatase: 382 U/L — ABNORMAL HIGH (ref 38–126)
Anion gap: 12 (ref 5–15)
BUN: 17 mg/dL (ref 6–20)
CHLORIDE: 91 mmol/L — AB (ref 101–111)
CO2: 27 mmol/L (ref 22–32)
Calcium: 8.8 mg/dL — ABNORMAL LOW (ref 8.9–10.3)
Creatinine, Ser: 1.42 mg/dL — ABNORMAL HIGH (ref 0.61–1.24)
GFR calc Af Amer: >60 mL/min (ref >60)
GFR, EST NON AFRICAN AMERICAN: 52 mL/min — AB (ref >60)
Glucose, Bld: 135 mg/dL — ABNORMAL HIGH (ref 65–99)
POTASSIUM: 4.1 mmol/L (ref 3.5–5.1)
SODIUM: 130 mmol/L — AB (ref 135–145)
Total Bilirubin: 1.8 mg/dL — ABNORMAL HIGH (ref 0.3–1.2)
Total Protein: 7.2 g/dL (ref 6.5–8.1)

## 2017-05-29 MED ORDER — SODIUM CHLORIDE 0.9 % IV SOLN
240.0000 mg | Freq: Once | INTRAVENOUS | Status: AC
  Administered 2017-05-29: 240 mg via INTRAVENOUS
  Filled 2017-05-29: qty 24

## 2017-05-29 MED ORDER — SODIUM CHLORIDE 0.9 % IV SOLN
Freq: Once | INTRAVENOUS | Status: AC
  Administered 2017-05-29: 09:00:00 via INTRAVENOUS
  Filled 2017-05-29: qty 1000

## 2017-05-29 NOTE — Progress Notes (Signed)
ANC: 700. Patient states, "I caught an upper respiratory bug from my family. I went to [facility] clinic to see the doctor yesterday and she started me on some antibiotics and an inhaler that I am going to pick up today."  Vital signs stable, see flow sheet. MD, Dr. Grayland Ormond, notified and aware. Per MD order: Proceed with scheduled treatment today. Education on neutropenic precautions reinforced with patient.

## 2017-06-07 ENCOUNTER — Ambulatory Visit
Admission: RE | Admit: 2017-06-07 | Discharge: 2017-06-07 | Disposition: A | Discharge status: Home / Self Care | Payer: Medicare HMO | Source: Ambulatory Visit | Attending: Oncology | Admitting: Oncology

## 2017-06-07 DIAGNOSIS — I700 Atherosclerosis of aorta: Secondary | ICD-10-CM | POA: Diagnosis not present

## 2017-06-07 DIAGNOSIS — K8020 Calculus of gallbladder without cholecystitis without obstruction: Secondary | ICD-10-CM | POA: Insufficient documentation

## 2017-06-07 DIAGNOSIS — C7802 Secondary malignant neoplasm of left lung: Secondary | ICD-10-CM | POA: Diagnosis not present

## 2017-06-07 DIAGNOSIS — R188 Other ascites: Secondary | ICD-10-CM | POA: Diagnosis not present

## 2017-06-07 DIAGNOSIS — K766 Portal hypertension: Secondary | ICD-10-CM | POA: Insufficient documentation

## 2017-06-07 DIAGNOSIS — C649 Malignant neoplasm of unspecified kidney, except renal pelvis: Secondary | ICD-10-CM | POA: Diagnosis not present

## 2017-06-07 DIAGNOSIS — K439 Ventral hernia without obstruction or gangrene: Secondary | ICD-10-CM | POA: Insufficient documentation

## 2017-06-07 DIAGNOSIS — K4090 Unilateral inguinal hernia, without obstruction or gangrene, not specified as recurrent: Secondary | ICD-10-CM | POA: Insufficient documentation

## 2017-06-07 DIAGNOSIS — C642 Malignant neoplasm of left kidney, except renal pelvis: Secondary | ICD-10-CM | POA: Diagnosis not present

## 2017-06-07 DIAGNOSIS — I2510 Atherosclerotic heart disease of native coronary artery without angina pectoris: Secondary | ICD-10-CM | POA: Diagnosis not present

## 2017-06-07 DIAGNOSIS — J432 Centrilobular emphysema: Secondary | ICD-10-CM | POA: Diagnosis not present

## 2017-06-07 DIAGNOSIS — Z905 Acquired absence of kidney: Secondary | ICD-10-CM | POA: Diagnosis not present

## 2017-06-07 DIAGNOSIS — R590 Localized enlarged lymph nodes: Secondary | ICD-10-CM | POA: Insufficient documentation

## 2017-06-07 DIAGNOSIS — K7460 Unspecified cirrhosis of liver: Secondary | ICD-10-CM | POA: Insufficient documentation

## 2017-06-07 DIAGNOSIS — C7800 Secondary malignant neoplasm of unspecified lung: Secondary | ICD-10-CM | POA: Diagnosis not present

## 2017-06-07 MED ORDER — IOPAMIDOL (ISOVUE-300) INJECTION 61%
85.0000 mL | Freq: Once | INTRAVENOUS | Status: AC | PRN
  Administered 2017-06-07: 85 mL via INTRAVENOUS

## 2017-06-09 NOTE — Progress Notes (Signed)
[place]  [phone] [phone]  ID: Diamantina Providence OB: [birth year]  MR#: [id number]  JKD#:[id number]  Patient Care Team: [name], [name], MD as PCP - General (Internal Medicine)  CHIEF COMPLAINT: Stage IV renal cell carcinoma with bilateral lung metastases.  INTERVAL HISTORY: Patient returns to clinic today for further evaluation, discussion of his imaging results, and consideration of cycle 30 of nivolumab.  He recently had a cough for several weeks and was placed on antibiotics, but overall has felt well.  He continues to have mild ascites, but has not required a paracentesis recently.  He denies any abdominal pain. He continues to have significant peripheral neuropathy in both feet secondary to diabetes, but has no other neurologic complaints. He denies any fevers, chills, or recent illnesses.  He has a fair appetite, but his weight has remained stable.  He denies any chest pain, shortness of breath, cough, or hemoptysis. He has no nausea, vomiting, constipation, or diarrhea. He has no melena or hematochezia. He has no urinary complaints.  Patient offers no further specific complaints today.  REVIEW OF SYSTEMS:   Review of Systems  Constitutional: Negative for fever, malaise/fatigue and weight loss.  HENT: Positive for congestion.   Eyes: Negative for blurred vision.  Respiratory: Positive for cough. Negative for shortness of breath.   Cardiovascular: Negative.  Negative for chest pain and leg swelling.  Gastrointestinal: Negative.  Negative for abdominal pain, constipation, diarrhea, nausea and vomiting.  Genitourinary: Negative.  Negative for dysuria.  Musculoskeletal: Negative.   Skin: Negative.  Negative for rash.  Neurological: Positive for tingling and sensory change. Negative for focal weakness, weakness and headaches.  Psychiatric/Behavioral: Negative.  The patient is not nervous/anxious and does not have insomnia.    As per HPI.  Otherwise, a complete review of systems is negative.   PAST MEDICAL HISTORY: Past Medical History:  Diagnosis Date  . Anemia   . BPH (benign prostatic hyperplasia)   . CKD (chronic kidney disease) stage 3, GFR 30-59 ml/min (HCC)   . Clear cell renal cell carcinoma (HCC)   . Colon polyps   . Diabetes mellitus without complication ([place])    type 2   . Dyspnea    with exertion  . GERD (gastroesophageal reflux disease)   . History of gout   . History of nephrectomy    Left  . Hypertension   . Neuropathy   . Renal cell carcinoma of left kidney (HCC)    mets to lungs  . Umbilical hernia     PAST SURGICAL HISTORY: Past Surgical History:  Procedure Laterality Date  . BACK SURGERY     ruptured disc 1991   . ENDOBRONCHIAL ULTRASOUND N/A 10/14/2015   Procedure: ENDOBRONCHIAL ULTRASOUND;  Surgeon: [name], MD;  Location: [facility] ORS;  Service: Pulmonary;  Laterality: N/A;  . ESOPHAGOGASTRODUODENOSCOPY (EGD) WITH PROPOFOL N/A 04/15/2017   Procedure: ESOPHAGOGASTRODUODENOSCOPY (EGD) WITH PROPOFOL;  Surgeon: Jonathon Bellows, MD;  Location: [facility] END[facility];  Service: Gastroenterology;  Laterality: N/A;  Screen for esophageal varices  . EYE SURGERY Bilateral    Cataract Extraction with IOL  . KNEE SURGERY Right   . NEPHRECTOMY     left kidney 2014 renal cell cancer     FAMILY HISTORY: Family History  Problem Relation Age of Onset  . Ovarian cancer Mother   . Diabetes Father   . Hypertension Father     ADVANCED DIRECTIVES (Y/N):  N  HEALTH MAINTENANCE: Social History   Tobacco Use  .  Smoking status: Former Smoker    Packs/day: 1.00    Types: Cigarettes    Last attempt to quit: 04/15/1993    Years since quitting: 24.1  . Smokeless tobacco: Never Used  Substance Use Topics  . Alcohol use: Yes    Alcohol/week: 25.2 oz    Types: 42 Cans of beer per week    Comment: 4-5 cans of beer a day  . Drug use: No     Colonoscopy:  PAP:  Bone density:  Lipid panel:  No  Known Allergies  Current Outpatient Medications  Medication Sig Dispense Refill  . acetaminophen (TYLENOL) 325 MG tablet Take 325 mg by mouth every 6 (six) hours as needed.    Marland Kitchen albuterol (PROVENTIL HFA;VENTOLIN HFA) 108 (90 Base) MCG/ACT inhaler Inhale 1-2 puffs into the lungs every 6 (six) hours as needed for wheezing or shortness of breath. 1 Inhaler 11  . allopurinol (ZYLOPRIM) 100 MG tablet Take 1 tablet (100 mg total) by mouth daily. 90 tablet 0  . ALPRAZolam (XANAX) 1 MG tablet Take 1 mg by mouth as needed for sleep.    Marland Kitchen amLODipine (NORVASC) 5 MG tablet Take 1 tablet (5 mg total) by mouth daily. 90 tablet 1  . aspirin 81 MG chewable tablet Chew 81 mg by mouth daily.    . colchicine 0.6 MG tablet Take 0.6 mg by mouth every other day.     . fluticasone (FLONASE) 50 MCG/ACT nasal spray   0  . furosemide (LASIX) 40 MG tablet TAKE 1 TABLET BY MOUTH ONCE DAILY 61 tablet 0  . gabapentin (NEURONTIN) 100 MG capsule Take 2 capsules (200 mg total) by mouth 3 (three) times daily. 360 capsule 5  . insulin aspart (NOVOLOG) 100 UNIT/ML FlexPen Before meals 131-180 2 units, 181-240 4 units, 241-300 6 units, 301-350 8 units, 351-400 10 units, >400 12 units 15 mL 0  . Insulin Detemir (LEVEMIR FLEXTOUCH) 100 UNIT/ML Pen Inject 40 Units into the skin daily at 10 pm. 12 pen 12  . lovastatin (MEVACOR) 20 MG tablet Take 20 mg by mouth daily.     . Nebivolol HCl 20 MG TABS Take 20 mg by mouth daily.    . ONE TOUCH ULTRA TEST test strip Bid 180 each 3  . spironolactone (ALDACTONE) 50 MG tablet TAKE 1 TABLET BY MOUTH ONCE DAILY 60 tablet 0  . tamsulosin (FLOMAX) 0.4 MG CAPS capsule Take 0.4 mg by mouth every other day.     . Pseudoeph-Doxylamine-DM-APAP (NYQUIL PO) Take by mouth as needed.     Current Facility-Administered Medications  Medication Dose Route Frequency Provider Last Rate Last Dose  . albumin human 25 % solution 12.5 g  12.5 g Intravenous Once Jonathon Bellows, MD      . albumin human 25 % solution  12.5 g  12.5 g Intravenous Once Jonathon Bellows, MD      . albumin human 5 % solution 25 g  25 g Intravenous Once Jonathon Bellows, MD        OBJECTIVE: Vitals:   06/12/17 1017  BP: 129/83  Pulse: 76  Resp: 20  Temp: (!) 96.5 F (35.8 C)  SpO2: 100%     Body mass index is 22.75 kg/m.    ECOG FS:0 - Asymptomatic  General: Well-developed, well-nourished, no acute distress. Eyes: Pink conjunctiva, anicteric sclera. HEENT: Normocephalic, moist mucous membranes, clear oropharnyx. Lungs: Clear to auscultation bilaterally. Heart: Regular rate and rhythm. No rubs, murmurs, or gallops. Abdomen: Soft, nontender, nondistended. No organomegaly  noted, normoactive bowel sounds. Musculoskeletal: No edema, cyanosis, or clubbing. Neuro: Alert, answering all questions appropriately. Cranial nerves grossly intact. Skin: No rashes or petechiae noted. Psych: Normal affect.    LAB RESULTS:  Lab Results  Component Value Date   NA 133 (L) 06/12/2017   K 3.6 06/12/2017   CL 93 (L) 06/12/2017   CO2 29 06/12/2017   GLUCOSE 46 (L) 06/12/2017   BUN 19 06/12/2017   CREATININE 1.39 (H) 06/12/2017   CALCIUM 9.2 06/12/2017   PROT 7.8 06/12/2017   ALBUMIN 3.1 (L) 06/12/2017   AST 53 (H) 06/12/2017   ALT 27 06/12/2017   ALKPHOS 548 (H) 06/12/2017   BILITOT 1.8 (H) 06/12/2017   GFRNONAA 54 (L) 06/12/2017   GFRAA >60 06/12/2017    Lab Results  Component Value Date   WBC 4.1 06/12/2017   NEUTROABS 2.1 06/12/2017   HGB 11.5 (L) 06/12/2017   HCT 34.0 (L) 06/12/2017   MCV 84.6 06/12/2017   PLT 137 (L) 06/12/2017   Lab Results  Component Value Date   IRON 13 (L) 04/03/2017   TIBC 345 04/03/2017   IRONPCTSAT 4 (L) 04/03/2017   Lab Results  Component Value Date   FERRITIN 17 (L) 04/03/2017     STUDIES: Ct Chest W Contrast  Result Date: 06/07/2017 CLINICAL DATA:  Stage IV left renal cell carcinoma with lung metastasis. Diagnosed in 2014. Cirrhosis. EXAM: CT CHEST, ABDOMEN, AND PELVIS WITH CONTRAST  TECHNIQUE: Multidetector CT imaging of the chest, abdomen and pelvis was performed following the standard protocol during bolus administration of intravenous contrast. CONTRAST:  64m ISOVUE-300 IOPAMIDOL (ISOVUE-300) INJECTION 61% COMPARISON:  02/18/2017 FINDINGS: CT CHEST FINDINGS Cardiovascular: Aortic and branch vessel atherosclerosis. Normal heart size, without pericardial effusion. Multivessel coronary artery atherosclerosis. No central pulmonary embolism, on this non-dedicated study. Mediastinum/Nodes: No supraclavicular adenopathy. Subcarinal node of 2.1 cm on 28/2 versus 1.9 cm on the prior. Left hilar node of 2.5 x 2.6 cm on 28/2 versus 2.2 x 2.4 cm on the prior exam (when remeasured). Esophageal varices. Lungs/Pleura: Mild bilateral pleural thickening. Mild centrilobular emphysema. Bilateral pulmonary nodules. -right upper lobe index nodule measures 2.2 x 1.1 cm on 48/3 versus 1.9 x 1.2 cm on the prior. -left lower lobe pulmonary nodule measures 2.8 x 2.7 cm on image 88/3 versus 2.5 x 2.5 cm on the prior. -left upper lobe 7 mm nodule on image 34/3 is similar to on the prior exam (when remeasured). -Multiple right lower lobe pulmonary nodules including at 8 mm on 109/3 versus 5 mm on the prior exam (when remeasured). Musculoskeletal: No acute osseous abnormality. CT ABDOMEN PELVIS FINDINGS Hepatobiliary: Cirrhosis. More focal fibrosis involving segment 4A. Multiple small gallstones without acute cholecystitis or biliary duct dilatation. Pancreas: Aortic and branch vessel atherosclerosis. Spleen: Splenomegaly, 18.5 cm craniocaudal. Adrenals/Urinary Tract: Normal right adrenal gland. Suspect left adrenalectomy. Left nephrectomy. No locally recurrent disease. Normal right kidney, without hydronephrosis or hydroureter. Normal urinary bladder. Stomach/Bowel: Normal stomach, without wall thickening. Normal colon and terminal ileum. Normal small bowel. Vascular/Lymphatic: Aortic and branch vessel atherosclerosis.  Patent portal and splenic veins. Recannulized paraumbilical vein. No abdominopelvic adenopathy. Reproductive: Normal prostate. Other: Large volume ascites is similar. Similar moderate left inguinal hernia containing fluid. There is a ventral abdominal wall hernia that contains fluid and omental collaterals. This is felt to be slightly enlarged. More cephalad ventral wall laxity or hernia containing omental collaterals, similar. Musculoskeletal: Degenerate disc disease at L3-4 and L4-5. Similar lucent lesion in the left acetabulum, possibly degenerative.  IMPRESSION: 1. Disease progression within the chest, as evidenced by progressive thoracic adenopathy and minimal enlargement of some pulmonary nodules. 2. Aortic atherosclerosis (ICD10-I70.0), coronary artery atherosclerosis and emphysema (ICD10-J43.9). 3. No evidence of metastatic disease in the abdomen or pelvis, status post left nephrectomy. 4. Cirrhosis, portal venous hypertension, and large volume ascites. 5. Cholelithiasis. 6. Left inguinal and ventral abdominal wall hernias. Electronically Signed   By: Abigail Miyamoto M.D.   On: 06/07/2017 11:13   Ct Abdomen Pelvis W Contrast  Result Date: 06/07/2017 CLINICAL DATA:  Stage IV left renal cell carcinoma with lung metastasis. Diagnosed in 2014. Cirrhosis. EXAM: CT CHEST, ABDOMEN, AND PELVIS WITH CONTRAST TECHNIQUE: Multidetector CT imaging of the chest, abdomen and pelvis was performed following the standard protocol during bolus administration of intravenous contrast. CONTRAST:  17m ISOVUE-300 IOPAMIDOL (ISOVUE-300) INJECTION 61% COMPARISON:  02/18/2017 FINDINGS: CT CHEST FINDINGS Cardiovascular: Aortic and branch vessel atherosclerosis. Normal heart size, without pericardial effusion. Multivessel coronary artery atherosclerosis. No central pulmonary embolism, on this non-dedicated study. Mediastinum/Nodes: No supraclavicular adenopathy. Subcarinal node of 2.1 cm on 28/2 versus 1.9 cm on the prior. Left hilar  node of 2.5 x 2.6 cm on 28/2 versus 2.2 x 2.4 cm on the prior exam (when remeasured). Esophageal varices. Lungs/Pleura: Mild bilateral pleural thickening. Mild centrilobular emphysema. Bilateral pulmonary nodules. -right upper lobe index nodule measures 2.2 x 1.1 cm on 48/3 versus 1.9 x 1.2 cm on the prior. -left lower lobe pulmonary nodule measures 2.8 x 2.7 cm on image 88/3 versus 2.5 x 2.5 cm on the prior. -left upper lobe 7 mm nodule on image 34/3 is similar to on the prior exam (when remeasured). -Multiple right lower lobe pulmonary nodules including at 8 mm on 109/3 versus 5 mm on the prior exam (when remeasured). Musculoskeletal: No acute osseous abnormality. CT ABDOMEN PELVIS FINDINGS Hepatobiliary: Cirrhosis. More focal fibrosis involving segment 4A. Multiple small gallstones without acute cholecystitis or biliary duct dilatation. Pancreas: Aortic and branch vessel atherosclerosis. Spleen: Splenomegaly, 18.5 cm craniocaudal. Adrenals/Urinary Tract: Normal right adrenal gland. Suspect left adrenalectomy. Left nephrectomy. No locally recurrent disease. Normal right kidney, without hydronephrosis or hydroureter. Normal urinary bladder. Stomach/Bowel: Normal stomach, without wall thickening. Normal colon and terminal ileum. Normal small bowel. Vascular/Lymphatic: Aortic and branch vessel atherosclerosis. Patent portal and splenic veins. Recannulized paraumbilical vein. No abdominopelvic adenopathy. Reproductive: Normal prostate. Other: Large volume ascites is similar. Similar moderate left inguinal hernia containing fluid. There is a ventral abdominal wall hernia that contains fluid and omental collaterals. This is felt to be slightly enlarged. More cephalad ventral wall laxity or hernia containing omental collaterals, similar. Musculoskeletal: Degenerate disc disease at L3-4 and L4-5. Similar lucent lesion in the left acetabulum, possibly degenerative. IMPRESSION: 1. Disease progression within the chest, as  evidenced by progressive thoracic adenopathy and minimal enlargement of some pulmonary nodules. 2. Aortic atherosclerosis (ICD10-I70.0), coronary artery atherosclerosis and emphysema (ICD10-J43.9). 3. No evidence of metastatic disease in the abdomen or pelvis, status post left nephrectomy. 4. Cirrhosis, portal venous hypertension, and large volume ascites. 5. Cholelithiasis. 6. Left inguinal and ventral abdominal wall hernias. Electronically Signed   By: KAbigail MiyamotoM.D.   On: 06/07/2017 11:13     Oncology history: Patient underwent left nephrectomy on May 16, 2012 which revealed a clear cell grade 2 renal cell carcinoma, stage TIIIa, N0, M0. Tumor size of 16 cm. Patient was noted to have renal vein involvement, but no other structures were involved. 0 of 2 lymph nodes were negative for disease. PET  scan on October 20, 2015 revealed metastatic disease and patient was initiated on Votrient. This was subsequently discontinued in March 2018 secondary to progression of disease. Patient initiated second line treatment with nivolumab on Monday, April 02, 2016  ASSESSMENT: Stage IV left renal cell carcinoma with metastasis to the lungs  PLAN:    1. Stage IV left renal cell carcinoma with metastasis to the lungs: CT scan results from June 07, 2017 reviewed independently and report as above with mild, millimeter, possible progression of disease.  Above patient will likely have to change therapies in the near future, will continue with nivolumab for the time being.  Proceed with cycle 30 today.  Patient will receive a flat dose of 240 mg every 2 weeks until intolerable side effects or significant progression of disease.  Return to clinic in 2 weeks for consideration of cycle 31 and then in 4 weeks for further evaluation and consideration of cycle 32.   2. Pancytopenia: Chronic, but improved.  Likely secondary to bone marrow suppression from patient's history of alcoholism as well as underlying cirrhosis. Can consider  bone marrow biopsy in the future if necessary.   3. Renal insufficiency: Chronic and unchanged.  Patient's creatinine is mildly elevated, but approximately his baseline.  4. Peripheral neuropathy: Chronic and unchanged.  Likely secondary to diabetes, continue current dose of gabapentin. Patient plans to discuss with his primary care adjusting his medications possibly pursuing Lyrica. Monitor. 5.  Cirrhosis/ascites: Appreciate GI input.  Patient's most recent paracentesis was on March 22, 2017 for approximately 6.8 L of fluid was removed. Continue monitoring and treatment per GI.   5. Hyperbilirubinemia: Secondary to cirrhosis.  Bilirubin has trended up slightly.  Proceed with treatment as above and follow-up with GI as scheduled.   6. Hyperglycemia: Patient actually presented with hypoglycemia today.  Monitor. Continue close follow-up with primary care. 7.  Hyponatremia: Decreased, but stable.  Monitor.   8.  Iron deficiency anemia: Hemoglobin has improved with IV Feraheme.  Patient's last infusion occurred on May 01, 2017.      Approximately 30 minutes was spent in discussion of which greater than 50% was consultation.  Patient expressed understanding and was in agreement with this plan. He also understands that He can call clinic at any time with any questions, concerns, or complaints.   Cancer Staging Metastatic renal cell carcinoma to lung [facility]) Staging form: Kidney, AJCC 7th Edition - Clinical stage from 10/12/2015: Stage IV (TX, N0, M1) - Signed by Lloyd Huger, MD on 10/12/2015   Lloyd Huger, MD 06/15/17 6:58 PM

## 2017-06-12 ENCOUNTER — Encounter: Payer: Self-pay | Admitting: Oncology

## 2017-06-12 ENCOUNTER — Inpatient Hospital Stay: Payer: Medicare HMO

## 2017-06-12 ENCOUNTER — Inpatient Hospital Stay (HOSPITAL_BASED_OUTPATIENT_CLINIC_OR_DEPARTMENT_OTHER): Payer: Medicare HMO | Admitting: Oncology

## 2017-06-12 ENCOUNTER — Inpatient Hospital Stay: Payer: Medicare HMO | Attending: Oncology

## 2017-06-12 VITALS — BP 129/83 | HR 76 | Temp 96.5°F | Resp 20 | Wt 163.1 lb

## 2017-06-12 DIAGNOSIS — C7802 Secondary malignant neoplasm of left lung: Secondary | ICD-10-CM | POA: Diagnosis not present

## 2017-06-12 DIAGNOSIS — Z5111 Encounter for antineoplastic chemotherapy: Secondary | ICD-10-CM | POA: Insufficient documentation

## 2017-06-12 DIAGNOSIS — K7460 Unspecified cirrhosis of liver: Secondary | ICD-10-CM

## 2017-06-12 DIAGNOSIS — N183 Chronic kidney disease, stage 3 (moderate): Secondary | ICD-10-CM | POA: Diagnosis not present

## 2017-06-12 DIAGNOSIS — C649 Malignant neoplasm of unspecified kidney, except renal pelvis: Secondary | ICD-10-CM

## 2017-06-12 DIAGNOSIS — Z79899 Other long term (current) drug therapy: Secondary | ICD-10-CM | POA: Diagnosis not present

## 2017-06-12 DIAGNOSIS — Z905 Acquired absence of kidney: Secondary | ICD-10-CM | POA: Insufficient documentation

## 2017-06-12 DIAGNOSIS — E1122 Type 2 diabetes mellitus with diabetic chronic kidney disease: Secondary | ICD-10-CM | POA: Insufficient documentation

## 2017-06-12 DIAGNOSIS — I129 Hypertensive chronic kidney disease with stage 1 through stage 4 chronic kidney disease, or unspecified chronic kidney disease: Secondary | ICD-10-CM

## 2017-06-12 DIAGNOSIS — F1020 Alcohol dependence, uncomplicated: Secondary | ICD-10-CM | POA: Diagnosis not present

## 2017-06-12 DIAGNOSIS — C7801 Secondary malignant neoplasm of right lung: Secondary | ICD-10-CM

## 2017-06-12 DIAGNOSIS — Z87891 Personal history of nicotine dependence: Secondary | ICD-10-CM

## 2017-06-12 DIAGNOSIS — R188 Other ascites: Secondary | ICD-10-CM | POA: Insufficient documentation

## 2017-06-12 DIAGNOSIS — Z794 Long term (current) use of insulin: Secondary | ICD-10-CM

## 2017-06-12 DIAGNOSIS — D61818 Other pancytopenia: Secondary | ICD-10-CM

## 2017-06-12 DIAGNOSIS — E871 Hypo-osmolality and hyponatremia: Secondary | ICD-10-CM | POA: Insufficient documentation

## 2017-06-12 DIAGNOSIS — D509 Iron deficiency anemia, unspecified: Secondary | ICD-10-CM | POA: Insufficient documentation

## 2017-06-12 DIAGNOSIS — C342 Malignant neoplasm of middle lobe, bronchus or lung: Secondary | ICD-10-CM

## 2017-06-12 LAB — COMPREHENSIVE METABOLIC PANEL
ALK PHOS: 548 U/L — AB (ref 38–126)
ALT: 27 U/L (ref 17–63)
ANION GAP: 11 (ref 5–15)
AST: 53 U/L — ABNORMAL HIGH (ref 15–41)
Albumin: 3.1 g/dL — ABNORMAL LOW (ref 3.5–5.0)
BILIRUBIN TOTAL: 1.8 mg/dL — AB (ref 0.3–1.2)
BUN: 19 mg/dL (ref 6–20)
CALCIUM: 9.2 mg/dL (ref 8.9–10.3)
CO2: 29 mmol/L (ref 22–32)
Chloride: 93 mmol/L — ABNORMAL LOW (ref 101–111)
Creatinine, Ser: 1.39 mg/dL — ABNORMAL HIGH (ref 0.61–1.24)
GFR calc non Af Amer: 54 mL/min — ABNORMAL LOW (ref >60)
GLUCOSE: 46 mg/dL — AB (ref 65–99)
Potassium: 3.6 mmol/L (ref 3.5–5.1)
Sodium: 133 mmol/L — ABNORMAL LOW (ref 135–145)
TOTAL PROTEIN: 7.8 g/dL (ref 6.5–8.1)

## 2017-06-12 LAB — CBC WITH DIFFERENTIAL/PLATELET
Basophils Absolute: 0 10*3/uL (ref 0–0.1)
Basophils Relative: 1 %
Eosinophils Absolute: 0.4 10*3/uL (ref 0–0.7)
Eosinophils Relative: 9 %
HEMATOCRIT: 34 % — AB (ref 40.0–52.0)
HEMOGLOBIN: 11.5 g/dL — AB (ref 13.0–18.0)
LYMPHS ABS: 0.9 10*3/uL — AB (ref 1.0–3.6)
Lymphocytes Relative: 22 %
MCH: 28.7 pg (ref 26.0–34.0)
MCHC: 33.9 g/dL (ref 32.0–36.0)
MCV: 84.6 fL (ref 80.0–100.0)
MONOS PCT: 15 %
Monocytes Absolute: 0.6 10*3/uL (ref 0.2–1.0)
NEUTROS ABS: 2.1 10*3/uL (ref 1.4–6.5)
NEUTROS PCT: 53 %
Platelets: 137 10*3/uL — ABNORMAL LOW (ref 150–440)
RBC: 4.02 MIL/uL — ABNORMAL LOW (ref 4.40–5.90)
RDW: 18.2 % — ABNORMAL HIGH (ref 11.5–14.5)
WBC: 4.1 10*3/uL (ref 3.8–10.6)

## 2017-06-12 MED ORDER — SODIUM CHLORIDE 0.9 % IV SOLN
Freq: Once | INTRAVENOUS | Status: AC
  Administered 2017-06-12: 11:00:00 via INTRAVENOUS
  Filled 2017-06-12: qty 1000

## 2017-06-12 MED ORDER — SODIUM CHLORIDE 0.9 % IV SOLN
240.0000 mg | Freq: Once | INTRAVENOUS | Status: AC
  Administered 2017-06-12: 240 mg via INTRAVENOUS
  Filled 2017-06-12: qty 24

## 2017-06-12 NOTE — Progress Notes (Signed)
Pt in for follow up, reports having a cough for 2-3 weeks, was on antibiotic therapy, finished and still have some cough and congestion.  Fair appetite.  States "feeling pretty good".

## 2017-06-18 ENCOUNTER — Other Ambulatory Visit: Payer: Self-pay | Admitting: Gastroenterology

## 2017-06-18 DIAGNOSIS — K7460 Unspecified cirrhosis of liver: Secondary | ICD-10-CM

## 2017-06-18 DIAGNOSIS — R188 Other ascites: Principal | ICD-10-CM

## 2017-06-20 ENCOUNTER — Ambulatory Visit: Payer: Medicare HMO | Admitting: Gastroenterology

## 2017-06-25 ENCOUNTER — Ambulatory Visit (INDEPENDENT_AMBULATORY_CARE_PROVIDER_SITE_OTHER): Payer: Medicare HMO | Admitting: Gastroenterology

## 2017-06-25 ENCOUNTER — Encounter: Payer: Self-pay | Admitting: Gastroenterology

## 2017-06-25 VITALS — BP 123/81 | HR 74 | Ht 71.0 in | Wt 165.0 lb

## 2017-06-25 DIAGNOSIS — R188 Other ascites: Secondary | ICD-10-CM

## 2017-06-25 DIAGNOSIS — B1910 Unspecified viral hepatitis B without hepatic coma: Secondary | ICD-10-CM

## 2017-06-25 DIAGNOSIS — K7460 Unspecified cirrhosis of liver: Secondary | ICD-10-CM

## 2017-06-25 DIAGNOSIS — B161 Acute hepatitis B with delta-agent without hepatic coma: Secondary | ICD-10-CM

## 2017-06-25 NOTE — Progress Notes (Signed)
Rodney Bellows MD, MRCP(U.K) [address]  [place]  [place], [place] [address]  Main: [phone]  Fax: [phone]   Primary Care Physician: [name], [name], MD  Primary Gastroenterologist:  Dr. Jonathon Stewart   No chief complaint on file.   HPI: Rodney Stewart is a 62 y.o. male   Summary of history :  Heis here to follow up forchronic liver disease and cirrhosis likely from alcohol and actively still drinking . Cirrhosis diagnosed in early 2019. He has a history of stage 4 RCC , b/l lung metastasis . CKD 3 and Diabetes Mellitus , on chemotherapy. He has pancytopenia and follows with Dr Grayland Ormond.  CT scan of the abdomen done on 02/18/2017 showed subcarinal lymphadenopathy. Multiple stable bilateral pulmonary nodules. Status post left nephrectomy. Liver features suggestive of cirrhosis splenomegaly recanalization of the paraumbilical vein. Large volume ascites. Cholelithiasis. Labs 02/19/17 : Immune to hep A. HCv ab -negative, Hep B e antigen positive , hep b c ab,HIV,AMA,Factin,ceruloplasmin -negtive. PTH normal. A1AT -normal , TSH elevated. Cr 1.37 , GFR 55, alk phos 293, ALT 16 , AST 39 , Ascites - no SBP,SAAG-suggetsive of portal HTN.  He has consumed 6-7 beers a day x many years atleast 30 years Hbsag, Hep B viral load -negative EGD 04/15/17- PHG noted no varices.    Labs 04/2017 : Hb 8.3, MCV 75 ,Iron 13, Ferritin 17   Interval history5/6/19 -06/25/17   06/07/17- Ct abdomen and chest shows disease progression , cirrhosis and large volume ascites. Chololithiasis, left inguinal hernias   Hepatitis Delta virus PCR negative, antigen positive. Hbsag negative.   Saying he doing well. Cut down salt in his diet .   Aldactone 100 mg and lasix 40 mg a day . Low salt diet . Still drinks an odd glass of wine. Says it is very seldom.    Current Outpatient Medications  Medication Sig Dispense Refill  . acetaminophen (TYLENOL) 325 MG tablet Take 325 mg by  mouth every 6 (six) hours as needed.    Marland Kitchen albuterol (PROVENTIL HFA;VENTOLIN HFA) 108 (90 Base) MCG/ACT inhaler Inhale 1-2 puffs into the lungs every 6 (six) hours as needed for wheezing or shortness of breath. 1 Inhaler 11  . allopurinol (ZYLOPRIM) 100 MG tablet Take 1 tablet (100 mg total) by mouth daily. 90 tablet 0  . ALPRAZolam (XANAX) 1 MG tablet Take 1 mg by mouth as needed for sleep.    Marland Kitchen amLODipine (NORVASC) 5 MG tablet Take 1 tablet (5 mg total) by mouth daily. 90 tablet 1  . aspirin 81 MG chewable tablet Chew 81 mg by mouth daily.    . colchicine 0.6 MG tablet Take 0.6 mg by mouth every other day.     . fluticasone (FLONASE) 50 MCG/ACT nasal spray   0  . furosemide (LASIX) 40 MG tablet TAKE 1 TABLET BY MOUTH ONCE DAILY 61 tablet 0  . gabapentin (NEURONTIN) 100 MG capsule Take 2 capsules (200 mg total) by mouth 3 (three) times daily. 360 capsule 5  . insulin aspart (NOVOLOG) 100 UNIT/ML FlexPen Before meals 131-180 2 units, 181-240 4 units, 241-300 6 units, 301-350 8 units, 351-400 10 units, >400 12 units 15 mL 0  . Insulin Detemir (LEVEMIR FLEXTOUCH) 100 UNIT/ML Pen Inject 40 Units into the skin daily at 10 pm. 12 pen 12  . lovastatin (MEVACOR) 20 MG tablet Take 20 mg by mouth daily.     . Nebivolol HCl 20 MG TABS Take 20 mg by mouth  daily.    . ONE TOUCH ULTRA TEST test strip Bid 180 each 3  . Pseudoeph-Doxylamine-DM-APAP (NYQUIL PO) Take by mouth as needed.    Marland Kitchen spironolactone (ALDACTONE) 50 MG tablet TAKE 1 TABLET BY MOUTH ONCE DAILY 60 tablet 0  . tamsulosin (FLOMAX) 0.4 MG CAPS capsule Take 0.4 mg by mouth every other day.      Current Facility-Administered Medications  Medication Dose Route Frequency Provider Last Rate Last Dose  . albumin human 25 % solution 12.5 g  12.5 g Intravenous Once Rodney Bellows, MD      . albumin human 25 % solution 12.5 g  12.5 g Intravenous Once Rodney Bellows, MD      . albumin human 5 % solution 25 g  25 g Intravenous Once Rodney Bellows, MD         Allergies as of 06/25/2017  . (No Known Allergies)    ROS:  General: Negative for anorexia, weight loss, fever, chills, fatigue, weakness. ENT: Negative for hoarseness, difficulty swallowing , nasal congestion. CV: Negative for chest pain, angina, palpitations, dyspnea on exertion, peripheral edema.  Respiratory: Negative for dyspnea at rest, dyspnea on exertion, cough, sputum, wheezing.  GI: See history of present illness. GU:  Negative for dysuria, hematuria, urinary incontinence, urinary frequency, nocturnal urination.  Endo: Negative for unusual weight change.    Physical Examination:   There were no vitals taken for this visit.  General: Well-nourished, well-developed in no acute distress.  Eyes: No icterus. Conjunctivae pink. Mouth: Oropharyngeal mucosa moist and pink , no lesions erythema or exudate. Lungs: Clear to auscultation bilaterally. Non-labored. Heart: Regular rate and rhythm, no murmurs rubs or gallops.  Abdomen: Bowel sounds are normal, nontender, distended , fluid thrill +, no hepatosplenomegaly or masses, no abdominal bruits or hernia , no rebound or guarding.   Extremities: No lower extremity edema. No clubbing or deformities. Neuro: Alert and oriented x 3.  Grossly intact. Skin: Warm and dry, no jaundice.   Psych: Alert and cooperative, normal mood and affect.   Imaging Studies: Ct Chest W Contrast  Result Date: 06/07/2017 CLINICAL DATA:  Stage IV left renal cell carcinoma with lung metastasis. Diagnosed in 2014. Cirrhosis. EXAM: CT CHEST, ABDOMEN, AND PELVIS WITH CONTRAST TECHNIQUE: Multidetector CT imaging of the chest, abdomen and pelvis was performed following the standard protocol during bolus administration of intravenous contrast. CONTRAST:  23m ISOVUE-300 IOPAMIDOL (ISOVUE-300) INJECTION 61% COMPARISON:  02/18/2017 FINDINGS: CT CHEST FINDINGS Cardiovascular: Aortic and branch vessel atherosclerosis. Normal heart size, without pericardial effusion.  Multivessel coronary artery atherosclerosis. No central pulmonary embolism, on this non-dedicated study. Mediastinum/Nodes: No supraclavicular adenopathy. Subcarinal node of 2.1 cm on 28/2 versus 1.9 cm on the prior. Left hilar node of 2.5 x 2.6 cm on 28/2 versus 2.2 x 2.4 cm on the prior exam (when remeasured). Esophageal varices. Lungs/Pleura: Mild bilateral pleural thickening. Mild centrilobular emphysema. Bilateral pulmonary nodules. -right upper lobe index nodule measures 2.2 x 1.1 cm on 48/3 versus 1.9 x 1.2 cm on the prior. -left lower lobe pulmonary nodule measures 2.8 x 2.7 cm on image 88/3 versus 2.5 x 2.5 cm on the prior. -left upper lobe 7 mm nodule on image 34/3 is similar to on the prior exam (when remeasured). -Multiple right lower lobe pulmonary nodules including at 8 mm on 109/3 versus 5 mm on the prior exam (when remeasured). Musculoskeletal: No acute osseous abnormality. CT ABDOMEN PELVIS FINDINGS Hepatobiliary: Cirrhosis. More focal fibrosis involving segment 4A. Multiple small gallstones without acute cholecystitis  or biliary duct dilatation. Pancreas: Aortic and branch vessel atherosclerosis. Spleen: Splenomegaly, 18.5 cm craniocaudal. Adrenals/Urinary Tract: Normal right adrenal gland. Suspect left adrenalectomy. Left nephrectomy. No locally recurrent disease. Normal right kidney, without hydronephrosis or hydroureter. Normal urinary bladder. Stomach/Bowel: Normal stomach, without wall thickening. Normal colon and terminal ileum. Normal small bowel. Vascular/Lymphatic: Aortic and branch vessel atherosclerosis. Patent portal and splenic veins. Recannulized paraumbilical vein. No abdominopelvic adenopathy. Reproductive: Normal prostate. Other: Large volume ascites is similar. Similar moderate left inguinal hernia containing fluid. There is a ventral abdominal wall hernia that contains fluid and omental collaterals. This is felt to be slightly enlarged. More cephalad ventral wall laxity or hernia  containing omental collaterals, similar. Musculoskeletal: Degenerate disc disease at L3-4 and L4-5. Similar lucent lesion in the left acetabulum, possibly degenerative. IMPRESSION: 1. Disease progression within the chest, as evidenced by progressive thoracic adenopathy and minimal enlargement of some pulmonary nodules. 2. Aortic atherosclerosis (ICD10-I70.0), coronary artery atherosclerosis and emphysema (ICD10-J43.9). 3. No evidence of metastatic disease in the abdomen or pelvis, status post left nephrectomy. 4. Cirrhosis, portal venous hypertension, and large volume ascites. 5. Cholelithiasis. 6. Left inguinal and ventral abdominal wall hernias. Electronically Signed   By: Abigail Miyamoto M.D.   On: 06/07/2017 11:13   Ct Abdomen Pelvis W Contrast  Result Date: 06/07/2017 CLINICAL DATA:  Stage IV left renal cell carcinoma with lung metastasis. Diagnosed in 2014. Cirrhosis. EXAM: CT CHEST, ABDOMEN, AND PELVIS WITH CONTRAST TECHNIQUE: Multidetector CT imaging of the chest, abdomen and pelvis was performed following the standard protocol during bolus administration of intravenous contrast. CONTRAST:  39m ISOVUE-300 IOPAMIDOL (ISOVUE-300) INJECTION 61% COMPARISON:  02/18/2017 FINDINGS: CT CHEST FINDINGS Cardiovascular: Aortic and branch vessel atherosclerosis. Normal heart size, without pericardial effusion. Multivessel coronary artery atherosclerosis. No central pulmonary embolism, on this non-dedicated study. Mediastinum/Nodes: No supraclavicular adenopathy. Subcarinal node of 2.1 cm on 28/2 versus 1.9 cm on the prior. Left hilar node of 2.5 x 2.6 cm on 28/2 versus 2.2 x 2.4 cm on the prior exam (when remeasured). Esophageal varices. Lungs/Pleura: Mild bilateral pleural thickening. Mild centrilobular emphysema. Bilateral pulmonary nodules. -right upper lobe index nodule measures 2.2 x 1.1 cm on 48/3 versus 1.9 x 1.2 cm on the prior. -left lower lobe pulmonary nodule measures 2.8 x 2.7 cm on image 88/3 versus 2.5 x  2.5 cm on the prior. -left upper lobe 7 mm nodule on image 34/3 is similar to on the prior exam (when remeasured). -Multiple right lower lobe pulmonary nodules including at 8 mm on 109/3 versus 5 mm on the prior exam (when remeasured). Musculoskeletal: No acute osseous abnormality. CT ABDOMEN PELVIS FINDINGS Hepatobiliary: Cirrhosis. More focal fibrosis involving segment 4A. Multiple small gallstones without acute cholecystitis or biliary duct dilatation. Pancreas: Aortic and branch vessel atherosclerosis. Spleen: Splenomegaly, 18.5 cm craniocaudal. Adrenals/Urinary Tract: Normal right adrenal gland. Suspect left adrenalectomy. Left nephrectomy. No locally recurrent disease. Normal right kidney, without hydronephrosis or hydroureter. Normal urinary bladder. Stomach/Bowel: Normal stomach, without wall thickening. Normal colon and terminal ileum. Normal small bowel. Vascular/Lymphatic: Aortic and branch vessel atherosclerosis. Patent portal and splenic veins. Recannulized paraumbilical vein. No abdominopelvic adenopathy. Reproductive: Normal prostate. Other: Large volume ascites is similar. Similar moderate left inguinal hernia containing fluid. There is a ventral abdominal wall hernia that contains fluid and omental collaterals. This is felt to be slightly enlarged. More cephalad ventral wall laxity or hernia containing omental collaterals, similar. Musculoskeletal: Degenerate disc disease at L3-4 and L4-5. Similar lucent lesion in the left acetabulum, possibly degenerative.  IMPRESSION: 1. Disease progression within the chest, as evidenced by progressive thoracic adenopathy and minimal enlargement of some pulmonary nodules. 2. Aortic atherosclerosis (ICD10-I70.0), coronary artery atherosclerosis and emphysema (ICD10-J43.9). 3. No evidence of metastatic disease in the abdomen or pelvis, status post left nephrectomy. 4. Cirrhosis, portal venous hypertension, and large volume ascites. 5. Cholelithiasis. 6. Left inguinal  and ventral abdominal wall hernias. Electronically Signed   By: Abigail Miyamoto M.D.   On: 06/07/2017 11:13    Assessment and Plan:   Rodney Stewart is a 62 y.o. y/o malehere to follow upfor chronic liver disease/cirhosis, ascites likely secondary to alcohol. No encephelopathy. Elevated alkaline phosphotase likely from metastatic RCC. HepBe antigen negative with no virus , Hepatitis Delta antigen positive, viral load negative as well. Has cut down on alcohol. With his comorbidity , would not think he would be a good candidate for interferon and may not tolerate it(low white cell count, anemia ). Elevated creatinine and hard to manage with diuretics. Not a liver transplant candidate due to metastatic renal cancer.  His transaminases are not elevated.   Plan  1..Low salt diet, avoid tylenol 2. RUQ USG in 6 months to screen for [facility] in 12/2017 3. Suggested discussion with palliative care regarding goals of care and long term plans in event of decompensation.  4. He would like to be seen at [facility] to see if they have any trials for patients with  hepatitis Delta- he is not a candidate for interferon due to his co morbidities. Refer to [facility] [place]   Dr Rodney Bellows  MD,MRCP [facility]) Follow up in 6 months

## 2017-06-26 ENCOUNTER — Inpatient Hospital Stay: Payer: Medicare HMO

## 2017-06-26 VITALS — BP 123/75 | HR 76 | Temp 97.2°F | Resp 20 | Wt 165.0 lb

## 2017-06-26 DIAGNOSIS — Z5111 Encounter for antineoplastic chemotherapy: Secondary | ICD-10-CM | POA: Diagnosis not present

## 2017-06-26 DIAGNOSIS — C7802 Secondary malignant neoplasm of left lung: Principal | ICD-10-CM

## 2017-06-26 DIAGNOSIS — C649 Malignant neoplasm of unspecified kidney, except renal pelvis: Secondary | ICD-10-CM

## 2017-06-26 LAB — COMPREHENSIVE METABOLIC PANEL
ALK PHOS: 466 U/L — AB (ref 38–126)
ALT: 23 U/L (ref 0–44)
AST: 45 U/L — ABNORMAL HIGH (ref 15–41)
Albumin: 2.9 g/dL — ABNORMAL LOW (ref 3.5–5.0)
Anion gap: 11 (ref 5–15)
BILIRUBIN TOTAL: 1.8 mg/dL — AB (ref 0.3–1.2)
BUN: 24 mg/dL — ABNORMAL HIGH (ref 6–20)
CALCIUM: 8.9 mg/dL (ref 8.9–10.3)
CO2: 29 mmol/L (ref 22–32)
Chloride: 89 mmol/L — ABNORMAL LOW (ref 98–111)
Creatinine, Ser: 1.42 mg/dL — ABNORMAL HIGH (ref 0.61–1.24)
GFR calc non Af Amer: 52 mL/min — ABNORMAL LOW (ref >60)
Glucose, Bld: 205 mg/dL — ABNORMAL HIGH (ref 70–99)
Potassium: 4.7 mmol/L (ref 3.5–5.1)
Sodium: 129 mmol/L — ABNORMAL LOW (ref 135–145)
TOTAL PROTEIN: 7.1 g/dL (ref 6.5–8.1)

## 2017-06-26 LAB — CBC WITH DIFFERENTIAL/PLATELET
BASOS ABS: 0 10*3/uL (ref 0–0.1)
BASOS PCT: 1 %
EOS ABS: 0.3 10*3/uL (ref 0–0.7)
Eosinophils Relative: 10 %
HCT: 31.2 % — ABNORMAL LOW (ref 40.0–52.0)
HEMOGLOBIN: 10.3 g/dL — AB (ref 13.0–18.0)
Lymphocytes Relative: 17 %
Lymphs Abs: 0.5 10*3/uL — ABNORMAL LOW (ref 1.0–3.6)
MCH: 28 pg (ref 26.0–34.0)
MCHC: 33 g/dL (ref 32.0–36.0)
MCV: 84.8 fL (ref 80.0–100.0)
Monocytes Absolute: 0.3 10*3/uL (ref 0.2–1.0)
Monocytes Relative: 13 %
NEUTROS PCT: 59 %
Neutro Abs: 1.6 10*3/uL (ref 1.4–6.5)
Platelets: 109 10*3/uL — ABNORMAL LOW (ref 150–440)
RBC: 3.68 MIL/uL — AB (ref 4.40–5.90)
RDW: 15.8 % — ABNORMAL HIGH (ref 11.5–14.5)
WBC: 2.8 10*3/uL — AB (ref 3.8–10.6)

## 2017-06-26 MED ORDER — SODIUM CHLORIDE 0.9 % IV SOLN
240.0000 mg | Freq: Once | INTRAVENOUS | Status: AC
  Administered 2017-06-26: 240 mg via INTRAVENOUS
  Filled 2017-06-26: qty 24

## 2017-06-26 MED ORDER — SODIUM CHLORIDE 0.9 % IV SOLN
Freq: Once | INTRAVENOUS | Status: AC
  Administered 2017-06-26: 14:00:00 via INTRAVENOUS
  Filled 2017-06-26: qty 1000

## 2017-06-28 DIAGNOSIS — H43393 Other vitreous opacities, bilateral: Secondary | ICD-10-CM | POA: Diagnosis not present

## 2017-06-28 DIAGNOSIS — Z961 Presence of intraocular lens: Secondary | ICD-10-CM | POA: Diagnosis not present

## 2017-06-28 DIAGNOSIS — E113293 Type 2 diabetes mellitus with mild nonproliferative diabetic retinopathy without macular edema, bilateral: Secondary | ICD-10-CM | POA: Diagnosis not present

## 2017-07-07 NOTE — Progress Notes (Signed)
Rodney Stewart  Telephone:([phone]) [phone] [phone]  ID: Diamantina Providence OB: 1955/10/13  MR#: [id number]  PFX#:[id number]  Patient Care Team: [name], [name], MD as PCP - General (Internal Medicine)  CHIEF COMPLAINT: Stage IV renal cell carcinoma with bilateral lung metastases.  INTERVAL HISTORY: Patient returns to clinic today for further evaluation and continuation of maintenance nivolumab.  He currently feels well and is asymptomatic.  He continues to have mild ascites, but has not required a paracentesis recently.  He denies any abdominal pain. He continues to have significant peripheral neuropathy in both feet secondary to diabetes, but has no other neurologic complaints. He denies any fevers, chills, or recent illnesses.  He has a fair appetite, but his weight has remained stable.  He denies any chest pain, shortness of breath, cough, or hemoptysis. He has no nausea, vomiting, constipation, or diarrhea. He has no melena or hematochezia. He has no urinary complaints.  Patient offers no specific complaints today.  REVIEW OF SYSTEMS:   Review of Systems  Constitutional: Negative for fever, malaise/fatigue and weight loss.  HENT: Negative.  Negative for congestion.   Eyes: Negative for blurred vision.  Respiratory: Negative for cough and shortness of breath.   Cardiovascular: Negative.  Negative for chest pain and leg swelling.  Gastrointestinal: Negative.  Negative for abdominal pain, constipation, diarrhea, nausea and vomiting.  Genitourinary: Negative.  Negative for dysuria.  Musculoskeletal: Negative.   Skin: Negative.  Negative for rash.  Neurological: Positive for tingling and sensory change. Negative for focal weakness, weakness and headaches.  Psychiatric/Behavioral: Negative.  The patient is not nervous/anxious and does not have insomnia.    As per HPI. Otherwise, a complete review of systems is negative.   PAST MEDICAL HISTORY: Past Medical  History:  Diagnosis Date  . Anemia   . BPH (benign prostatic hyperplasia)   . CKD (chronic kidney disease) stage 3, GFR 30-59 ml/min (HCC)   . Clear cell renal cell carcinoma (HCC)   . Colon polyps   . Diabetes mellitus without complication ([place])    type 2   . Dyspnea    with exertion  . GERD (gastroesophageal reflux disease)   . History of gout   . History of nephrectomy    Left  . Hypertension   . Neuropathy   . Renal cell carcinoma of left kidney (HCC)    mets to lungs  . Umbilical hernia     PAST SURGICAL HISTORY: Past Surgical History:  Procedure Laterality Date  . BACK SURGERY     ruptured disc 1991   . ENDOBRONCHIAL ULTRASOUND N/A 10/14/2015   Procedure: ENDOBRONCHIAL ULTRASOUND;  Surgeon: [name], MD;  Location: [facility] ORS;  Service: Pulmonary;  Laterality: N/A;  . ESOPHAGOGASTRODUODENOSCOPY (EGD) WITH PROPOFOL N/A 04/15/2017   Procedure: ESOPHAGOGASTRODUODENOSCOPY (EGD) WITH PROPOFOL;  Surgeon: Jonathon Bellows, MD;  Location: [facility] ENDOSCOPY;  Service: Gastroenterology;  Laterality: N/A;  Screen for esophageal varices  . EYE SURGERY Bilateral    Cataract Extraction with IOL  . KNEE SURGERY Right   . NEPHRECTOMY     left kidney 2014 renal cell cancer     FAMILY HISTORY: Family History  Problem Relation Age of Onset  . Ovarian cancer Mother   . Diabetes Father   . Hypertension Father     ADVANCED DIRECTIVES (Y/N):  N  HEALTH MAINTENANCE: Social History   Tobacco Use  . Smoking status: Former Smoker    Packs/day: 1.00    Types: Cigarettes    Last  attempt to quit: 04/15/1993    Years since quitting: 24.2  . Smokeless tobacco: Never Used  Substance Use Topics  . Alcohol use: Yes    Alcohol/week: 25.2 oz    Types: 42 Cans of beer per week    Comment: 4-5 cans of beer a day  . Drug use: No     Colonoscopy:  PAP:  Bone density:  Lipid panel:  No Known Allergies  Current Outpatient Medications  Medication Sig Dispense Refill  .  acetaminophen (TYLENOL) 325 MG tablet Take 325 mg by mouth every 6 (six) hours as needed.    Marland Kitchen albuterol (PROVENTIL HFA;VENTOLIN HFA) 108 (90 Base) MCG/ACT inhaler Inhale 1-2 puffs into the lungs every 6 (six) hours as needed for wheezing or shortness of breath. 1 Inhaler 11  . allopurinol (ZYLOPRIM) 100 MG tablet Take 1 tablet (100 mg total) by mouth daily. 90 tablet 0  . ALPRAZolam (XANAX) 1 MG tablet Take 1 mg by mouth as needed for sleep.    Marland Kitchen amLODipine (NORVASC) 5 MG tablet Take 1 tablet (5 mg total) by mouth daily. 90 tablet 1  . aspirin 81 MG chewable tablet Chew 81 mg by mouth daily.    . colchicine 0.6 MG tablet Take 0.6 mg by mouth every other day.     . fluticasone (FLONASE) 50 MCG/ACT nasal spray   0  . furosemide (LASIX) 40 MG tablet TAKE 1 TABLET BY MOUTH ONCE DAILY 61 tablet 0  . gabapentin (NEURONTIN) 100 MG capsule Take 2 capsules (200 mg total) by mouth 3 (three) times daily. 360 capsule 5  . insulin aspart (NOVOLOG) 100 UNIT/ML FlexPen Before meals 131-180 2 units, 181-240 4 units, 241-300 6 units, 301-350 8 units, 351-400 10 units, >400 12 units 15 mL 0  . Insulin Detemir (LEVEMIR FLEXTOUCH) 100 UNIT/ML Pen Inject 40 Units into the skin daily at 10 pm. 12 pen 12  . lovastatin (MEVACOR) 20 MG tablet Take 20 mg by mouth daily.     . Nebivolol HCl 20 MG TABS Take 20 mg by mouth daily.    . ONE TOUCH ULTRA TEST test strip Bid 180 each 3  . Pseudoeph-Doxylamine-DM-APAP (NYQUIL PO) Take by mouth as needed.    Marland Kitchen spironolactone (ALDACTONE) 50 MG tablet TAKE 1 TABLET BY MOUTH ONCE DAILY 60 tablet 0  . tamsulosin (FLOMAX) 0.4 MG CAPS capsule Take 0.4 mg by mouth every other day.      Current Facility-Administered Medications  Medication Dose Route Frequency Provider Last Rate Last Dose  . albumin human 25 % solution 12.5 g  12.5 g Intravenous Once Jonathon Bellows, MD      . albumin human 25 % solution 12.5 g  12.5 g Intravenous Once Jonathon Bellows, MD      . albumin human 5 % solution 25 g   25 g Intravenous Once Jonathon Bellows, MD        OBJECTIVE: Vitals:   07/10/17 0938  BP: 120/76  Pulse: 74  Resp: 18  Temp: 98 F (36.7 C)     Body mass index is 22.76 kg/m.    ECOG FS:0 - Asymptomatic  General: Well-developed, well-nourished, no acute distress. Eyes: Pink conjunctiva, anicteric sclera. Lungs: Clear to auscultation bilaterally. Heart: Regular rate and rhythm. No rubs, murmurs, or gallops. Abdomen: Soft, nontender, nondistended. No organomegaly noted, normoactive bowel sounds. Musculoskeletal: No edema, cyanosis, or clubbing. Neuro: Alert, answering all questions appropriately. Cranial nerves grossly intact. Skin: No rashes or petechiae noted. Psych: Normal affect.  LAB RESULTS:  Lab Results  Component Value Date   NA 128 (L) 07/10/2017   K 3.9 07/10/2017   CL 86 (L) 07/10/2017   CO2 28 07/10/2017   GLUCOSE 188 (H) 07/10/2017   BUN 23 07/10/2017   CREATININE 1.51 (H) 07/10/2017   CALCIUM 9.7 07/10/2017   PROT 7.7 07/10/2017   ALBUMIN 3.2 (L) 07/10/2017   AST 43 (H) 07/10/2017   ALT 21 07/10/2017   ALKPHOS 502 (H) 07/10/2017   BILITOT 2.0 (H) 07/10/2017   GFRNONAA 48 (L) 07/10/2017   GFRAA 56 (L) 07/10/2017    Lab Results  Component Value Date   WBC 3.3 (L) 07/10/2017   NEUTROABS 1.9 07/10/2017   HGB 11.1 (L) 07/10/2017   HCT 32.6 (L) 07/10/2017   MCV 81.6 07/10/2017   PLT 139 (L) 07/10/2017   Lab Results  Component Value Date   IRON 13 (L) 04/03/2017   TIBC 345 04/03/2017   IRONPCTSAT 4 (L) 04/03/2017   Lab Results  Component Value Date   FERRITIN 17 (L) 04/03/2017     STUDIES: No results found.   Oncology history: Patient underwent left nephrectomy on May 16, 2012 which revealed a clear cell grade 2 renal cell carcinoma, stage TIIIa, N0, M0. Tumor size of 16 cm. Patient was noted to have renal vein involvement, but no other structures were involved. 0 of 2 lymph nodes were negative for disease. PET scan on October 20, 2015 revealed  metastatic disease and patient was initiated on Votrient. This was subsequently discontinued in March 2018 secondary to progression of disease. Patient initiated second line treatment with nivolumab on Monday, April 02, 2016  ASSESSMENT: Stage IV left renal cell carcinoma with metastasis to the lungs  PLAN:    1. Stage IV left renal cell carcinoma with metastasis to the lungs: CT scan results from June 07, 2017 reviewed independently with mild, millimeter, possible progression of disease.  Patient will likely have to change therapies in the near future, but will continue with nivolumab for the time being.  Proceed with patient's next infusion of nivolumab today.  He will receive a flat dose of 240 mg every 2 weeks until intolerable side effects or significant progression of disease.  Return to clinic in 2 weeks for treatment consideration and then in 4 weeks for further evaluation and continuation of nivolumab.  Plan to repeat imaging in approximately September 2019. 2. Pancytopenia: Chronic and relatively unchanged.  Patient's white blood cell, red blood cell, and platelets have trended up slightly.  Likely secondary to bone marrow suppression from patient's history of alcoholism as well as underlying cirrhosis.  Bone marrow biopsy was considered, but is not necessary at this time.   3. Renal insufficiency: Chronic and unchanged.  Patient's hemoglobin is 1.51 today which is approximately his baseline.   4. Peripheral neuropathy: Chronic and unchanged.  Likely secondary to diabetes, continue current dose of gabapentin.  Continue evaluation and treatment per primary care. 5.  Cirrhosis/ascites: Appreciate GI input.  Patient's most recent paracentesis was on March 22, 2017 for approximately 6.8 L of fluid was removed. Continue monitoring and treatment per GI.   5. Hyperbilirubinemia: Secondary to cirrhosis.  Bilirubin 2.0 today.  Proceed with treatment as above and follow-up with GI as scheduled.   6.  Hyperglycemia: Blood glucose 188.  Monitor. Continue close follow-up with primary care. 7.  Hyponatremia: Decreased, but stable.  Monitor.   8.  Iron deficiency anemia: Hemoglobin has improved with IV Feraheme.  Patient's last  infusion occurred on May 01, 2017.      Patient expressed understanding and was in agreement with this plan. He also understands that He can call clinic at any time with any questions, concerns, or complaints.   Cancer Staging Metastatic renal cell carcinoma to lung [facility]) Staging form: Kidney, AJCC 7th Edition - Clinical stage from 10/12/2015: Stage IV (TX, N0, M1) - Signed by Lloyd Huger, MD on 10/12/2015   Lloyd Huger, MD 07/12/17 4:28 PM

## 2017-07-10 ENCOUNTER — Other Ambulatory Visit: Payer: Self-pay

## 2017-07-10 ENCOUNTER — Inpatient Hospital Stay: Payer: Medicare HMO | Attending: Oncology

## 2017-07-10 ENCOUNTER — Inpatient Hospital Stay (HOSPITAL_BASED_OUTPATIENT_CLINIC_OR_DEPARTMENT_OTHER): Payer: Medicare HMO | Admitting: Oncology

## 2017-07-10 ENCOUNTER — Inpatient Hospital Stay: Payer: Medicare HMO

## 2017-07-10 ENCOUNTER — Encounter: Payer: Self-pay | Admitting: Oncology

## 2017-07-10 VITALS — BP 120/76 | HR 74 | Temp 98.0°F | Resp 18 | Wt 163.2 lb

## 2017-07-10 DIAGNOSIS — C7802 Secondary malignant neoplasm of left lung: Principal | ICD-10-CM

## 2017-07-10 DIAGNOSIS — K7460 Unspecified cirrhosis of liver: Secondary | ICD-10-CM

## 2017-07-10 DIAGNOSIS — D61818 Other pancytopenia: Secondary | ICD-10-CM | POA: Insufficient documentation

## 2017-07-10 DIAGNOSIS — Z794 Long term (current) use of insulin: Secondary | ICD-10-CM | POA: Insufficient documentation

## 2017-07-10 DIAGNOSIS — E1122 Type 2 diabetes mellitus with diabetic chronic kidney disease: Secondary | ICD-10-CM

## 2017-07-10 DIAGNOSIS — I129 Hypertensive chronic kidney disease with stage 1 through stage 4 chronic kidney disease, or unspecified chronic kidney disease: Secondary | ICD-10-CM

## 2017-07-10 DIAGNOSIS — C342 Malignant neoplasm of middle lobe, bronchus or lung: Secondary | ICD-10-CM | POA: Insufficient documentation

## 2017-07-10 DIAGNOSIS — C649 Malignant neoplasm of unspecified kidney, except renal pelvis: Secondary | ICD-10-CM

## 2017-07-10 DIAGNOSIS — F1020 Alcohol dependence, uncomplicated: Secondary | ICD-10-CM | POA: Diagnosis not present

## 2017-07-10 DIAGNOSIS — Z79899 Other long term (current) drug therapy: Secondary | ICD-10-CM

## 2017-07-10 DIAGNOSIS — N183 Chronic kidney disease, stage 3 (moderate): Secondary | ICD-10-CM | POA: Insufficient documentation

## 2017-07-10 DIAGNOSIS — D509 Iron deficiency anemia, unspecified: Secondary | ICD-10-CM | POA: Diagnosis not present

## 2017-07-10 DIAGNOSIS — Z905 Acquired absence of kidney: Secondary | ICD-10-CM | POA: Insufficient documentation

## 2017-07-10 DIAGNOSIS — R188 Other ascites: Secondary | ICD-10-CM

## 2017-07-10 DIAGNOSIS — C7801 Secondary malignant neoplasm of right lung: Secondary | ICD-10-CM

## 2017-07-10 DIAGNOSIS — Z5111 Encounter for antineoplastic chemotherapy: Secondary | ICD-10-CM | POA: Diagnosis present

## 2017-07-10 DIAGNOSIS — E871 Hypo-osmolality and hyponatremia: Secondary | ICD-10-CM

## 2017-07-10 DIAGNOSIS — Z87891 Personal history of nicotine dependence: Secondary | ICD-10-CM | POA: Insufficient documentation

## 2017-07-10 LAB — CBC WITH DIFFERENTIAL/PLATELET
BASOS ABS: 0 10*3/uL (ref 0–0.1)
BASOS PCT: 1 %
EOS PCT: 11 %
Eosinophils Absolute: 0.4 10*3/uL (ref 0–0.7)
HCT: 32.6 % — ABNORMAL LOW (ref 40.0–52.0)
Hemoglobin: 11.1 g/dL — ABNORMAL LOW (ref 13.0–18.0)
Lymphocytes Relative: 19 %
Lymphs Abs: 0.6 10*3/uL — ABNORMAL LOW (ref 1.0–3.6)
MCH: 27.7 pg (ref 26.0–34.0)
MCHC: 34 g/dL (ref 32.0–36.0)
MCV: 81.6 fL (ref 80.0–100.0)
Monocytes Absolute: 0.4 10*3/uL (ref 0.2–1.0)
Monocytes Relative: 12 %
Neutro Abs: 1.9 10*3/uL (ref 1.4–6.5)
Neutrophils Relative %: 57 %
PLATELETS: 139 10*3/uL — AB (ref 150–440)
RBC: 4 MIL/uL — AB (ref 4.40–5.90)
RDW: 15.5 % — ABNORMAL HIGH (ref 11.5–14.5)
WBC: 3.3 10*3/uL — AB (ref 3.8–10.6)

## 2017-07-10 LAB — COMPREHENSIVE METABOLIC PANEL
ALT: 21 U/L (ref 0–44)
ANION GAP: 14 (ref 5–15)
AST: 43 U/L — ABNORMAL HIGH (ref 15–41)
Albumin: 3.2 g/dL — ABNORMAL LOW (ref 3.5–5.0)
Alkaline Phosphatase: 502 U/L — ABNORMAL HIGH (ref 38–126)
BUN: 23 mg/dL (ref 8–23)
CALCIUM: 9.7 mg/dL (ref 8.9–10.3)
CO2: 28 mmol/L (ref 22–32)
CREATININE: 1.51 mg/dL — AB (ref 0.61–1.24)
Chloride: 86 mmol/L — ABNORMAL LOW (ref 98–111)
GFR, EST AFRICAN AMERICAN: 56 mL/min — AB (ref >60)
GFR, EST NON AFRICAN AMERICAN: 48 mL/min — AB (ref >60)
Glucose, Bld: 188 mg/dL — ABNORMAL HIGH (ref 70–99)
Potassium: 3.9 mmol/L (ref 3.5–5.1)
SODIUM: 128 mmol/L — AB (ref 135–145)
Total Bilirubin: 2 mg/dL — ABNORMAL HIGH (ref 0.3–1.2)
Total Protein: 7.7 g/dL (ref 6.5–8.1)

## 2017-07-10 MED ORDER — NIVOLUMAB CHEMO INJECTION 100 MG/10ML
240.0000 mg | Freq: Once | INTRAVENOUS | Status: AC
  Administered 2017-07-10: 240 mg via INTRAVENOUS
  Filled 2017-07-10: qty 24

## 2017-07-10 MED ORDER — SODIUM CHLORIDE 0.9 % IV SOLN
Freq: Once | INTRAVENOUS | Status: AC
  Administered 2017-07-10: 11:00:00 via INTRAVENOUS
  Filled 2017-07-10: qty 1000

## 2017-07-10 NOTE — Progress Notes (Signed)
Patient denies any concerns today.  

## 2017-07-16 ENCOUNTER — Other Ambulatory Visit: Payer: Self-pay | Admitting: Gastroenterology

## 2017-07-23 ENCOUNTER — Encounter: Payer: Self-pay | Admitting: *Deleted

## 2017-07-24 ENCOUNTER — Inpatient Hospital Stay: Payer: Medicare HMO

## 2017-07-24 VITALS — BP 110/76 | HR 66 | Resp 18

## 2017-07-24 DIAGNOSIS — C7802 Secondary malignant neoplasm of left lung: Principal | ICD-10-CM

## 2017-07-24 DIAGNOSIS — C649 Malignant neoplasm of unspecified kidney, except renal pelvis: Secondary | ICD-10-CM

## 2017-07-24 DIAGNOSIS — Z5111 Encounter for antineoplastic chemotherapy: Secondary | ICD-10-CM | POA: Diagnosis not present

## 2017-07-24 LAB — CBC WITH DIFFERENTIAL/PLATELET
BASOS ABS: 0 10*3/uL (ref 0–0.1)
Basophils Relative: 1 %
EOS ABS: 0.4 10*3/uL (ref 0–0.7)
Eosinophils Relative: 10 %
HEMATOCRIT: 35 % — AB (ref 40.0–52.0)
Hemoglobin: 11.5 g/dL — ABNORMAL LOW (ref 13.0–18.0)
Lymphocytes Relative: 18 %
Lymphs Abs: 0.7 10*3/uL — ABNORMAL LOW (ref 1.0–3.6)
MCH: 26.8 pg (ref 26.0–34.0)
MCHC: 33 g/dL (ref 32.0–36.0)
MCV: 81.3 fL (ref 80.0–100.0)
MONO ABS: 0.5 10*3/uL (ref 0.2–1.0)
MONOS PCT: 13 %
NEUTROS ABS: 2.2 10*3/uL (ref 1.4–6.5)
Neutrophils Relative %: 58 %
PLATELETS: 140 10*3/uL — AB (ref 150–440)
RBC: 4.3 MIL/uL — ABNORMAL LOW (ref 4.40–5.90)
RDW: 15.3 % — AB (ref 11.5–14.5)
WBC: 3.7 10*3/uL — ABNORMAL LOW (ref 3.8–10.6)

## 2017-07-24 LAB — COMPREHENSIVE METABOLIC PANEL
ALBUMIN: 3.3 g/dL — AB (ref 3.5–5.0)
ALT: 22 U/L (ref 0–44)
AST: 44 U/L — AB (ref 15–41)
Alkaline Phosphatase: 522 U/L — ABNORMAL HIGH (ref 38–126)
Anion gap: 14 (ref 5–15)
BILIRUBIN TOTAL: 1.8 mg/dL — AB (ref 0.3–1.2)
BUN: 27 mg/dL — ABNORMAL HIGH (ref 8–23)
CALCIUM: 9.4 mg/dL (ref 8.9–10.3)
CO2: 27 mmol/L (ref 22–32)
CREATININE: 1.37 mg/dL — AB (ref 0.61–1.24)
Chloride: 86 mmol/L — ABNORMAL LOW (ref 98–111)
GFR calc Af Amer: >60 mL/min (ref >60)
GFR calc non Af Amer: 54 mL/min — ABNORMAL LOW (ref >60)
Glucose, Bld: 158 mg/dL — ABNORMAL HIGH (ref 70–99)
Potassium: 4.3 mmol/L (ref 3.5–5.1)
Sodium: 127 mmol/L — ABNORMAL LOW (ref 135–145)
TOTAL PROTEIN: 7.9 g/dL (ref 6.5–8.1)

## 2017-07-24 MED ORDER — SODIUM CHLORIDE 0.9 % IV SOLN
240.0000 mg | Freq: Once | INTRAVENOUS | Status: AC
  Administered 2017-07-24: 240 mg via INTRAVENOUS
  Filled 2017-07-24: qty 24

## 2017-07-24 MED ORDER — SODIUM CHLORIDE 0.9 % IV SOLN
Freq: Once | INTRAVENOUS | Status: AC
  Administered 2017-07-24: 12:00:00 via INTRAVENOUS
  Filled 2017-07-24: qty 1000

## 2017-07-29 ENCOUNTER — Telehealth: Payer: Self-pay

## 2017-07-29 NOTE — Telephone Encounter (Signed)
Patient contacted office to inquire if he should be taking Furosemide.  He said he has not had it in a while now.  Reviewed his last office visit notes and it stated that he was taking 100mg  of Aldactone and 40mg  Lasix.  He said this is incorrect he takes Aldactone 50mg  daily and no lasix.  He mentioned that he does have some swelling.  Please advise.  Thanks [employer]

## 2017-07-30 ENCOUNTER — Telehealth: Payer: Self-pay

## 2017-07-30 NOTE — Telephone Encounter (Signed)
Patient has been advised refilling Furosemide is not granted due to his elevated creatinine levels which could cause more damage to his liver if resumed.  He did not receive referral to [facility] as previously requested.   I will call [facility] to see why patient never received a call for appointment.  Thanks [employer]

## 2017-07-30 NOTE — Telephone Encounter (Signed)
dont think he can go on lasix with his elevated creatinine at this time. He wanted referral to [facility], did that happen?

## 2017-08-05 DIAGNOSIS — I10 Essential (primary) hypertension: Secondary | ICD-10-CM | POA: Diagnosis not present

## 2017-08-05 DIAGNOSIS — D631 Anemia in chronic kidney disease: Secondary | ICD-10-CM | POA: Diagnosis not present

## 2017-08-05 DIAGNOSIS — N183 Chronic kidney disease, stage 3 (moderate): Secondary | ICD-10-CM | POA: Diagnosis not present

## 2017-08-05 DIAGNOSIS — N185 Chronic kidney disease, stage 5: Secondary | ICD-10-CM | POA: Diagnosis not present

## 2017-08-05 DIAGNOSIS — R809 Proteinuria, unspecified: Secondary | ICD-10-CM | POA: Diagnosis not present

## 2017-08-05 NOTE — Progress Notes (Signed)
[place]  [phone] [phone]) [phone]  ID: Diamantina Providence OB: Feb 28, 1955  MR#: [id number]  IZT#:[id number]  Patient Care Team: [name], [name], MD as PCP - General (Internal Medicine)  CHIEF COMPLAINT: Stage IV renal cell carcinoma with bilateral lung metastases.  INTERVAL HISTORY: Patient returns to clinic today for further evaluation and continuation of maintenance nivolumab.  He has noted increased abdominal bloating and ascites.  He does not complain abdominal pain.  He otherwise feels well. He continues to have significant peripheral neuropathy in both feet secondary to diabetes, but has no other neurologic complaints. He denies any fevers, chills, or recent illnesses.  He has a fair appetite, but his weight has increased secondary to ascites.  He denies any chest pain, shortness of breath, cough, or hemoptysis. He has no nausea, vomiting, constipation, or diarrhea. He has no melena or hematochezia. He has no urinary complaints.  Patient offers no further specific complaints today.  REVIEW OF SYSTEMS:   Review of Systems  Constitutional: Negative for fever, malaise/fatigue and weight loss.  HENT: Negative.  Negative for congestion.   Eyes: Negative for blurred vision.  Respiratory: Negative.  Negative for cough and shortness of breath.   Cardiovascular: Negative.  Negative for chest pain and leg swelling.  Gastrointestinal: Negative.  Negative for abdominal pain, constipation, diarrhea, nausea and vomiting.  Genitourinary: Negative.  Negative for dysuria.  Musculoskeletal: Negative.  Negative for back pain.  Skin: Negative.  Negative for rash.  Neurological: Positive for tingling and sensory change. Negative for focal weakness, weakness and headaches.  Psychiatric/Behavioral: Negative.  The patient is not nervous/anxious and does not have insomnia.    As per HPI. Otherwise, a complete review of systems is negative.   PAST MEDICAL  HISTORY: Past Medical History:  Diagnosis Date  . Anemia   . BPH (benign prostatic hyperplasia)   . CKD (chronic kidney disease) stage 3, GFR 30-59 ml/min (HCC)   . Clear cell renal cell carcinoma (HCC)   . Colon polyps   . Diabetes mellitus without complication ([place])    type 2   . Dyspnea    with exertion  . GERD (gastroesophageal reflux disease)   . History of gout   . History of nephrectomy    Left  . Hypertension   . Neuropathy   . Renal cell carcinoma of left kidney (HCC)    mets to lungs  . Umbilical hernia     PAST SURGICAL HISTORY: Past Surgical History:  Procedure Laterality Date  . BACK SURGERY     ruptured disc 1991   . ENDOBRONCHIAL ULTRASOUND N/A 10/14/2015   Procedure: ENDOBRONCHIAL ULTRASOUND;  Surgeon: [name], MD;  Location: [facility] ORS;  Service: Pulmonary;  Laterality: N/A;  . ESOPHAGOGASTRODUODENOSCOPY (EGD) WITH PROPOFOL N/A 04/15/2017   Procedure: ESOPHAGOGASTRODUODENOSCOPY (EGD) WITH PROPOFOL;  Surgeon: Jonathon Bellows, MD;  Location: [facility] ENDOSCOPY;  Service: Gastroenterology;  Laterality: N/A;  Screen for esophageal varices  . EYE SURGERY Bilateral    Cataract Extraction with IOL  . KNEE SURGERY Right   . NEPHRECTOMY     left kidney 2014 renal cell cancer     FAMILY HISTORY: Family History  Problem Relation Age of Onset  . Ovarian cancer Mother   . Diabetes Father   . Hypertension Father     ADVANCED DIRECTIVES (Y/N):  N  HEALTH MAINTENANCE: Social History   Tobacco Use  . Smoking status: Former Smoker    Packs/day: 1.00    Types: Cigarettes  Last attempt to quit: 04/15/1993    Years since quitting: 24.3  . Smokeless tobacco: Never Used  Substance Use Topics  . Alcohol use: Yes    Alcohol/week: 42.0 standard drinks    Types: 42 Cans of beer per week    Comment: 4-5 cans of beer a day  . Drug use: No     Colonoscopy:  PAP:  Bone density:  Lipid panel:  No Known Allergies  Current Outpatient Medications   Medication Sig Dispense Refill  . acetaminophen (TYLENOL) 325 MG tablet Take 325 mg by mouth every 6 (six) hours as needed.    Marland Kitchen albuterol (PROVENTIL HFA;VENTOLIN HFA) 108 (90 Base) MCG/ACT inhaler Inhale 1-2 puffs into the lungs every 6 (six) hours as needed for wheezing or shortness of breath. 1 Inhaler 11  . allopurinol (ZYLOPRIM) 100 MG tablet Take 1 tablet (100 mg total) by mouth daily. 90 tablet 0  . ALPRAZolam (XANAX) 1 MG tablet Take 1 mg by mouth as needed for sleep.    Marland Kitchen amLODipine (NORVASC) 5 MG tablet Take 1 tablet (5 mg total) by mouth daily. 90 tablet 1  . aspirin 81 MG chewable tablet Chew 81 mg by mouth daily.    . colchicine 0.6 MG tablet Take 0.6 mg by mouth every other day.     . fluticasone (FLONASE) 50 MCG/ACT nasal spray   0  . gabapentin (NEURONTIN) 100 MG capsule Take 2 capsules (200 mg total) by mouth 3 (three) times daily. 360 capsule 5  . insulin aspart (NOVOLOG) 100 UNIT/ML FlexPen Before meals 131-180 2 units, 181-240 4 units, 241-300 6 units, 301-350 8 units, 351-400 10 units, >400 12 units 15 mL 0  . Insulin Detemir (LEVEMIR FLEXTOUCH) 100 UNIT/ML Pen Inject 40 Units into the skin daily at 10 pm. 12 pen 12  . Nebivolol HCl 20 MG TABS Take 20 mg by mouth daily.    . ONE TOUCH ULTRA TEST test strip Bid 180 each 3  . Pseudoeph-Doxylamine-DM-APAP (NYQUIL PO) Take by mouth as needed.    Marland Kitchen spironolactone (ALDACTONE) 50 MG tablet TAKE 1 TABLET BY MOUTH ONCE DAILY 60 tablet 0  . tamsulosin (FLOMAX) 0.4 MG CAPS capsule Take 0.4 mg by mouth every other day.     . furosemide (LASIX) 40 MG tablet TAKE 1 TABLET BY MOUTH ONCE DAILY (Patient not taking: Reported on 08/07/2017) 61 tablet 0  . lovastatin (MEVACOR) 20 MG tablet Take 1 tablet (20 mg total) by mouth daily at 6 PM. 90 tablet 3   Current Facility-Administered Medications  Medication Dose Route Frequency Provider Last Rate Last Dose  . albumin human 25 % solution 12.5 g  12.5 g Intravenous Once Jonathon Bellows, MD      .  albumin human 25 % solution 12.5 g  12.5 g Intravenous Once Jonathon Bellows, MD      . albumin human 5 % solution 25 g  25 g Intravenous Once Jonathon Bellows, MD        OBJECTIVE: Vitals:   08/07/17 0947  BP: 135/87  Pulse: 69  Resp: 20  Temp: 98.3 F (36.8 C)     Body mass index is 22.76 kg/m.    ECOG FS:0 - Asymptomatic  General: Well-developed, well-nourished, no acute distress. Eyes: Pink conjunctiva, anicteric sclera. HEENT: Normocephalic, moist mucous membranes. Lungs: Clear to auscultation bilaterally. Heart: Regular rate and rhythm. No rubs, murmurs, or gallops. Abdomen: Soft, nontender, distended. Musculoskeletal: No edema, cyanosis, or clubbing. Neuro: Alert, answering all questions appropriately.  Cranial nerves grossly intact. Skin: No rashes or petechiae noted. Psych: Normal affect.    LAB RESULTS:  Lab Results  Component Value Date   NA 125 (L) 08/07/2017   K 4.5 08/07/2017   CL 87 (L) 08/07/2017   CO2 24 08/07/2017   GLUCOSE 147 (H) 08/07/2017   BUN 27 (H) 08/07/2017   CREATININE 1.63 (H) 08/07/2017   CALCIUM 9.1 08/07/2017   PROT 7.9 08/07/2017   ALBUMIN 3.2 (L) 08/07/2017   AST 59 (H) 08/07/2017   ALT 24 08/07/2017   ALKPHOS 491 (H) 08/07/2017   BILITOT 2.1 (H) 08/07/2017   GFRNONAA 44 (L) 08/07/2017   GFRAA 51 (L) 08/07/2017    Lab Results  Component Value Date   WBC 3.9 08/07/2017   NEUTROABS 2.4 08/07/2017   HGB 11.8 (L) 08/07/2017   HCT 35.4 (L) 08/07/2017   MCV 81.9 08/07/2017   PLT 153 08/07/2017   Lab Results  Component Value Date   IRON 13 (L) 04/03/2017   TIBC 345 04/03/2017   IRONPCTSAT 4 (L) 04/03/2017   Lab Results  Component Value Date   FERRITIN 17 (L) 04/03/2017     STUDIES: US Paracentesis  Result Date: 08/08/2017 INDICATION: History of metastatic renal cell carcinoma with recurrent symptomatic ascites. Please perform paracentesis for therapeutic purposes. Please limit paracentesis to 5 L. EXAM: ULTRASOUND-GUIDED  PARACENTESIS COMPARISON:  Ultrasound-guided paracentesis-03/22/2017; CT the chest, abdomen and pelvis - 06/07/2017 MEDICATIONS: None. COMPLICATIONS: None immediate. TECHNIQUE: Informed written consent was obtained from the patient after a discussion of the risks, benefits and alternatives to treatment. A timeout was performed prior to the initiation of the procedure. Initial ultrasound scanning demonstrates a large amount of ascites within the right lower abdominal quadrant. The right lower abdomen was prepped and draped in the usual sterile fashion. 1% lidocaine with epinephrine was used for local anesthesia. An ultrasound image was saved for documentation purposed. An 8 Fr Safe-T-Centesis catheter was introduced. The paracentesis was performed. The catheter was removed and a dressing was applied. The patient tolerated the procedure well without immediate post procedural complication. FINDINGS: A total of approximately 5 liters of serous fluid was removed. IMPRESSION: Successful ultrasound-guided paracentesis yielding 5 liters of peritoneal fluid. Electronically Signed   By: Sandi Mariscal M.D.   On: 08/08/2017 15:54     Oncology history: Patient underwent left nephrectomy on May 16, 2012 which revealed a clear cell grade 2 renal cell carcinoma, stage TIIIa, N0, M0. Tumor size of 16 cm. Patient was noted to have renal vein involvement, but no other structures were involved. 0 of 2 lymph nodes were negative for disease. PET scan on October 20, 2015 revealed metastatic disease and patient was initiated on Votrient. This was subsequently discontinued in March 2018 secondary to progression of disease. Patient initiated second line treatment with nivolumab on Monday, April 02, 2016  ASSESSMENT: Stage IV left renal cell carcinoma with metastasis to the lungs  PLAN:    1. Stage IV left renal cell carcinoma with metastasis to the lungs: CT scan results from June 07, 2017 reviewed independently with mild, millimeter,  possible progression of disease.  Patient will likely have to change therapies in the near future, but will continue with nivolumab for the time being.  Proceed with patient's next infusion of nivolumab today.  He will receive a flat dose of 240 mg every 2 weeks until intolerable side effects or significant progression of disease.  Return to clinic in 2 weeks for nivolumab only and then  in 4 weeks for further evaluation and continuation of treatment.  Will reimage CT scan prior to appointment in 4 weeks. 2. Pancytopenia: Improving.  Patient's white blood cell count and platelet count are now within normal limits.  His hemoglobin is only mildly decreased at 11.8.  Likely secondary to bone marrow suppression from patient's history of alcoholism as well as underlying cirrhosis.  Bone marrow biopsy was considered, but is not necessary at this time.   3. Renal insufficiency: Patient creatinine of 1.60 is slightly above his baseline, monitor. 4. Peripheral neuropathy: Chronic and unchanged.  Likely secondary to diabetes, continue current dose of gabapentin.  Continue evaluation and treatment per primary care. 5.  Cirrhosis/ascites: Appreciate GI input.  Patient's ascites has become worse and paracentesis has been ordered. Continue monitoring and treatment per GI.   5. Hyperbilirubinemia: Secondary to cirrhosis.  Chronic and unchanged.  Bilirubin is 2.1 today.  Proceed with treatment as above and follow-up with GI as scheduled.   6. Hyperglycemia: Blood glucose is 147.  Monitor. Continue close follow-up with primary care. 7.  Hyponatremia: Chronic and unchanged, although patient's sodium level is slightly below his baseline today. 8.  Iron deficiency anemia: Hemoglobin has improved with IV Feraheme.  Patient's last infusion occurred on May 01, 2017.      Patient expressed understanding and was in agreement with this plan. He also understands that He can call clinic at any time with any questions, concerns, or  complaints.   Cancer Staging Metastatic renal cell carcinoma to lung [facility]) Staging form: Kidney, AJCC 7th Edition - Clinical stage from 10/12/2015: Stage IV (TX, N0, M1) - Signed by Lloyd Huger, MD on 10/12/2015   Lloyd Huger, MD 08/11/17 6:29 AM

## 2017-08-07 ENCOUNTER — Encounter: Payer: Self-pay | Admitting: Oncology

## 2017-08-07 ENCOUNTER — Inpatient Hospital Stay (HOSPITAL_BASED_OUTPATIENT_CLINIC_OR_DEPARTMENT_OTHER): Payer: Medicare HMO | Admitting: Oncology

## 2017-08-07 ENCOUNTER — Inpatient Hospital Stay: Payer: Medicare HMO | Attending: Oncology

## 2017-08-07 ENCOUNTER — Inpatient Hospital Stay: Payer: Medicare HMO

## 2017-08-07 VITALS — BP 135/87 | HR 69 | Temp 98.3°F | Resp 20 | Wt 163.2 lb

## 2017-08-07 DIAGNOSIS — Z5112 Encounter for antineoplastic immunotherapy: Secondary | ICD-10-CM | POA: Diagnosis not present

## 2017-08-07 DIAGNOSIS — D61818 Other pancytopenia: Secondary | ICD-10-CM

## 2017-08-07 DIAGNOSIS — Z79899 Other long term (current) drug therapy: Secondary | ICD-10-CM | POA: Insufficient documentation

## 2017-08-07 DIAGNOSIS — M109 Gout, unspecified: Secondary | ICD-10-CM | POA: Insufficient documentation

## 2017-08-07 DIAGNOSIS — K7460 Unspecified cirrhosis of liver: Secondary | ICD-10-CM | POA: Diagnosis not present

## 2017-08-07 DIAGNOSIS — R188 Other ascites: Secondary | ICD-10-CM | POA: Diagnosis not present

## 2017-08-07 DIAGNOSIS — Z794 Long term (current) use of insulin: Secondary | ICD-10-CM | POA: Diagnosis not present

## 2017-08-07 DIAGNOSIS — E871 Hypo-osmolality and hyponatremia: Secondary | ICD-10-CM | POA: Diagnosis not present

## 2017-08-07 DIAGNOSIS — C642 Malignant neoplasm of left kidney, except renal pelvis: Secondary | ICD-10-CM

## 2017-08-07 DIAGNOSIS — I129 Hypertensive chronic kidney disease with stage 1 through stage 4 chronic kidney disease, or unspecified chronic kidney disease: Secondary | ICD-10-CM | POA: Diagnosis not present

## 2017-08-07 DIAGNOSIS — N183 Chronic kidney disease, stage 3 (moderate): Secondary | ICD-10-CM | POA: Insufficient documentation

## 2017-08-07 DIAGNOSIS — C649 Malignant neoplasm of unspecified kidney, except renal pelvis: Secondary | ICD-10-CM

## 2017-08-07 DIAGNOSIS — E1142 Type 2 diabetes mellitus with diabetic polyneuropathy: Secondary | ICD-10-CM | POA: Insufficient documentation

## 2017-08-07 DIAGNOSIS — C7802 Secondary malignant neoplasm of left lung: Secondary | ICD-10-CM | POA: Diagnosis not present

## 2017-08-07 DIAGNOSIS — E1165 Type 2 diabetes mellitus with hyperglycemia: Secondary | ICD-10-CM

## 2017-08-07 DIAGNOSIS — Z87891 Personal history of nicotine dependence: Secondary | ICD-10-CM | POA: Diagnosis not present

## 2017-08-07 DIAGNOSIS — N400 Enlarged prostate without lower urinary tract symptoms: Secondary | ICD-10-CM | POA: Insufficient documentation

## 2017-08-07 DIAGNOSIS — Z905 Acquired absence of kidney: Secondary | ICD-10-CM | POA: Diagnosis not present

## 2017-08-07 DIAGNOSIS — C7801 Secondary malignant neoplasm of right lung: Secondary | ICD-10-CM

## 2017-08-07 DIAGNOSIS — Z7982 Long term (current) use of aspirin: Secondary | ICD-10-CM | POA: Insufficient documentation

## 2017-08-07 DIAGNOSIS — K219 Gastro-esophageal reflux disease without esophagitis: Secondary | ICD-10-CM | POA: Diagnosis not present

## 2017-08-07 DIAGNOSIS — D509 Iron deficiency anemia, unspecified: Secondary | ICD-10-CM | POA: Insufficient documentation

## 2017-08-07 LAB — COMPREHENSIVE METABOLIC PANEL
ALBUMIN: 3.2 g/dL — AB (ref 3.5–5.0)
ALT: 24 U/L (ref 0–44)
AST: 59 U/L — ABNORMAL HIGH (ref 15–41)
Alkaline Phosphatase: 491 U/L — ABNORMAL HIGH (ref 38–126)
Anion gap: 14 (ref 5–15)
BILIRUBIN TOTAL: 2.1 mg/dL — AB (ref 0.3–1.2)
BUN: 27 mg/dL — ABNORMAL HIGH (ref 8–23)
CO2: 24 mmol/L (ref 22–32)
Calcium: 9.1 mg/dL (ref 8.9–10.3)
Chloride: 87 mmol/L — ABNORMAL LOW (ref 98–111)
Creatinine, Ser: 1.63 mg/dL — ABNORMAL HIGH (ref 0.61–1.24)
GFR calc Af Amer: 51 mL/min — ABNORMAL LOW (ref >60)
GFR calc non Af Amer: 44 mL/min — ABNORMAL LOW (ref >60)
GLUCOSE: 147 mg/dL — AB (ref 70–99)
Potassium: 4.5 mmol/L (ref 3.5–5.1)
SODIUM: 125 mmol/L — AB (ref 135–145)
TOTAL PROTEIN: 7.9 g/dL (ref 6.5–8.1)

## 2017-08-07 LAB — CBC WITH DIFFERENTIAL/PLATELET
BASOS PCT: 1 %
Basophils Absolute: 0 10*3/uL (ref 0–0.1)
EOS ABS: 0.3 10*3/uL (ref 0–0.7)
EOS PCT: 7 %
HEMATOCRIT: 35.4 % — AB (ref 40.0–52.0)
Hemoglobin: 11.8 g/dL — ABNORMAL LOW (ref 13.0–18.0)
Lymphocytes Relative: 17 %
Lymphs Abs: 0.7 10*3/uL — ABNORMAL LOW (ref 1.0–3.6)
MCH: 27.4 pg (ref 26.0–34.0)
MCHC: 33.4 g/dL (ref 32.0–36.0)
MCV: 81.9 fL (ref 80.0–100.0)
MONO ABS: 0.5 10*3/uL (ref 0.2–1.0)
MONOS PCT: 14 %
Neutro Abs: 2.4 10*3/uL (ref 1.4–6.5)
Neutrophils Relative %: 61 %
Platelets: 153 10*3/uL (ref 150–440)
RBC: 4.33 MIL/uL — ABNORMAL LOW (ref 4.40–5.90)
RDW: 15.8 % — AB (ref 11.5–14.5)
WBC: 3.9 10*3/uL (ref 3.8–10.6)

## 2017-08-07 MED ORDER — SODIUM CHLORIDE 0.9 % IV SOLN
240.0000 mg | Freq: Once | INTRAVENOUS | Status: AC
  Administered 2017-08-07: 240 mg via INTRAVENOUS
  Filled 2017-08-07: qty 24

## 2017-08-07 MED ORDER — SODIUM CHLORIDE 0.9 % IV SOLN
Freq: Once | INTRAVENOUS | Status: AC
  Administered 2017-08-07: 11:00:00 via INTRAVENOUS
  Filled 2017-08-07: qty 1000

## 2017-08-07 NOTE — Progress Notes (Signed)
Patient reports fatigue today, states he saw Dr. Holley Raring recently and he recommended iron infusions.

## 2017-08-08 ENCOUNTER — Telehealth: Payer: Self-pay | Admitting: Internal Medicine

## 2017-08-08 ENCOUNTER — Other Ambulatory Visit: Payer: Self-pay | Admitting: Internal Medicine

## 2017-08-08 ENCOUNTER — Ambulatory Visit
Admission: RE | Admit: 2017-08-08 | Discharge: 2017-08-08 | Disposition: A | Discharge status: Home / Self Care | Payer: Medicare HMO | Source: Ambulatory Visit | Attending: Oncology | Admitting: Oncology

## 2017-08-08 DIAGNOSIS — R188 Other ascites: Secondary | ICD-10-CM

## 2017-08-08 DIAGNOSIS — E785 Hyperlipidemia, unspecified: Secondary | ICD-10-CM

## 2017-08-08 MED ORDER — LOVASTATIN 20 MG PO TABS: 20.0000 mg | ORAL_TABLET | Freq: Every day | ORAL | 3 refills | Status: AC

## 2017-08-08 NOTE — Telephone Encounter (Signed)
Pt is requesting to have his Lovastatin 40 mg refilled. He is out of medication. This was last filled by his previous provider. Pharmacy is [facility] on [address].

## 2017-08-08 NOTE — Telephone Encounter (Signed)
I refilled lovastatin 20 mg inform patient we have to use caution with the dosing given liver enzymes and kidney function   TMS

## 2017-08-08 NOTE — Procedures (Signed)
Pre Procedural Dx: Symptomatic Ascites Post Procedural Dx: Same  Successful US guided paracentesis yielding 5 L of serous ascitic fluid.  EBL: None Complications: None immediate  Jay Anthonyjames Bargar, MD Pager #: [phone]   

## 2017-08-08 NOTE — Telephone Encounter (Signed)
Ok to refill 

## 2017-08-09 NOTE — Telephone Encounter (Signed)
Patient was informed.  Patient understood and no questions, comments, or concerns at this time.

## 2017-08-14 ENCOUNTER — Other Ambulatory Visit: Payer: Self-pay | Admitting: Gastroenterology

## 2017-08-14 DIAGNOSIS — R188 Other ascites: Principal | ICD-10-CM

## 2017-08-14 DIAGNOSIS — K7460 Unspecified cirrhosis of liver: Secondary | ICD-10-CM

## 2017-08-16 ENCOUNTER — Other Ambulatory Visit: Payer: Self-pay

## 2017-08-16 ENCOUNTER — Telehealth: Payer: Self-pay | Admitting: Gastroenterology

## 2017-08-16 DIAGNOSIS — K7460 Unspecified cirrhosis of liver: Secondary | ICD-10-CM

## 2017-08-16 DIAGNOSIS — R188 Other ascites: Principal | ICD-10-CM

## 2017-08-16 MED ORDER — SPIRONOLACTONE 50 MG PO TABS: 50.0000 mg | ORAL_TABLET | Freq: Every day | ORAL | 1 refills | Status: DC

## 2017-08-16 NOTE — Telephone Encounter (Signed)
Pt left vm he states he is not sure if he is supposed to be taking this medicine or not please call pt

## 2017-08-16 NOTE — Telephone Encounter (Signed)
LVM for pt to return my call.

## 2017-08-19 NOTE — Telephone Encounter (Signed)
Pt wanted to know if Dr. Vicente Males wanted him to continue taking his Spironolactone 50mg  daily. If so, he will need a refill. Per Dr. Vicente Males, he should continue taking. Rx refill has been sent to pt's pharmacy. Pt aware.

## 2017-08-21 ENCOUNTER — Inpatient Hospital Stay: Payer: Medicare HMO

## 2017-08-21 VITALS — BP 114/77 | HR 67 | Temp 96.3°F | Resp 20 | Wt 164.4 lb

## 2017-08-21 DIAGNOSIS — C7802 Secondary malignant neoplasm of left lung: Principal | ICD-10-CM

## 2017-08-21 DIAGNOSIS — C649 Malignant neoplasm of unspecified kidney, except renal pelvis: Secondary | ICD-10-CM

## 2017-08-21 DIAGNOSIS — Z5112 Encounter for antineoplastic immunotherapy: Secondary | ICD-10-CM | POA: Diagnosis not present

## 2017-08-21 LAB — COMPREHENSIVE METABOLIC PANEL
ALBUMIN: 3.1 g/dL — AB (ref 3.5–5.0)
ALT: 27 U/L (ref 0–44)
ANION GAP: 9 (ref 5–15)
AST: 55 U/L — ABNORMAL HIGH (ref 15–41)
Alkaline Phosphatase: 516 U/L — ABNORMAL HIGH (ref 38–126)
BILIRUBIN TOTAL: 2 mg/dL — AB (ref 0.3–1.2)
BUN: 23 mg/dL (ref 8–23)
CO2: 27 mmol/L (ref 22–32)
Calcium: 8.5 mg/dL — ABNORMAL LOW (ref 8.9–10.3)
Chloride: 88 mmol/L — ABNORMAL LOW (ref 98–111)
Creatinine, Ser: 1.38 mg/dL — ABNORMAL HIGH (ref 0.61–1.24)
GFR calc Af Amer: >60 mL/min (ref >60)
GFR calc non Af Amer: 54 mL/min — ABNORMAL LOW (ref >60)
GLUCOSE: 256 mg/dL — AB (ref 70–99)
POTASSIUM: 5.5 mmol/L — AB (ref 3.5–5.1)
SODIUM: 124 mmol/L — AB (ref 135–145)
TOTAL PROTEIN: 7.1 g/dL (ref 6.5–8.1)

## 2017-08-21 LAB — CBC WITH DIFFERENTIAL/PLATELET
BASOS PCT: 1 %
Basophils Absolute: 0 10*3/uL (ref 0–0.1)
EOS ABS: 0.1 10*3/uL (ref 0–0.7)
Eosinophils Relative: 5 %
HCT: 33.6 % — ABNORMAL LOW (ref 40.0–52.0)
Hemoglobin: 11.2 g/dL — ABNORMAL LOW (ref 13.0–18.0)
Lymphocytes Relative: 14 %
Lymphs Abs: 0.4 10*3/uL — ABNORMAL LOW (ref 1.0–3.6)
MCH: 27.1 pg (ref 26.0–34.0)
MCHC: 33.4 g/dL (ref 32.0–36.0)
MCV: 80.9 fL (ref 80.0–100.0)
MONO ABS: 0.4 10*3/uL (ref 0.2–1.0)
Monocytes Relative: 11 %
Neutro Abs: 2.1 10*3/uL (ref 1.4–6.5)
Neutrophils Relative %: 69 %
Platelets: 126 10*3/uL — ABNORMAL LOW (ref 150–440)
RBC: 4.16 MIL/uL — ABNORMAL LOW (ref 4.40–5.90)
RDW: 15.5 % — AB (ref 11.5–14.5)
WBC: 3.1 10*3/uL — ABNORMAL LOW (ref 3.8–10.6)

## 2017-08-21 MED ORDER — SODIUM CHLORIDE 0.9 % IV SOLN
Freq: Once | INTRAVENOUS | Status: AC
  Administered 2017-08-21: 14:00:00 via INTRAVENOUS
  Filled 2017-08-21: qty 250

## 2017-08-21 MED ORDER — NIVOLUMAB CHEMO INJECTION 100 MG/10ML
240.0000 mg | Freq: Once | INTRAVENOUS | Status: AC
  Administered 2017-08-21: 240 mg via INTRAVENOUS
  Filled 2017-08-21: qty 24

## 2017-08-22 LAB — THYROID PANEL WITH TSH
Free Thyroxine Index: 1.9 (ref 1.2–4.9)
T3 Uptake Ratio: 30 % (ref 24–39)
T4, Total: 6.4 ug/dL (ref 4.5–12.0)
TSH: 8.37 u[IU]/mL — ABNORMAL HIGH (ref 0.450–4.500)

## 2017-08-26 ENCOUNTER — Telehealth: Payer: Self-pay | Admitting: Internal Medicine

## 2017-08-26 DIAGNOSIS — M109 Gout, unspecified: Secondary | ICD-10-CM

## 2017-08-26 MED ORDER — ALLOPURINOL 100 MG PO TABS: 100.0000 mg | ORAL_TABLET | Freq: Every day | ORAL | 0 refills | Status: DC

## 2017-08-26 NOTE — Telephone Encounter (Signed)
Copied from [place] [id number]. Topic: Quick Communication - Rx Refill/Question >> Aug 26, 2017  1:45 PM Reyne Dumas L wrote: Medication: allopurinol (ZYLOPRIM) 100 MG tablet  Has the patient contacted their pharmacy? yes (Agent: If no, request that the patient contact the pharmacy for the refill.) (Agent: If yes, when and what did the pharmacy advise?)  Preferred Pharmacy (with phone number or street name): [place] (N), [place] [address] [phone] (Phone) [phone] (Fax)  Agent: Please be advised that RX refills may take up to 3 business days. We ask that you follow-up with your pharmacy.

## 2017-08-27 ENCOUNTER — Other Ambulatory Visit: Payer: Self-pay | Admitting: Internal Medicine

## 2017-08-27 DIAGNOSIS — M109 Gout, unspecified: Secondary | ICD-10-CM

## 2017-08-27 MED ORDER — ALLOPURINOL 100 MG PO TABS: 100.0000 mg | ORAL_TABLET | Freq: Every day | ORAL | 3 refills | Status: AC

## 2017-08-30 ENCOUNTER — Ambulatory Visit
Admission: RE | Admit: 2017-08-30 | Discharge: 2017-08-30 | Disposition: A | Discharge status: Home / Self Care | Payer: Medicare HMO | Source: Ambulatory Visit | Attending: Oncology | Admitting: Oncology

## 2017-08-30 DIAGNOSIS — K766 Portal hypertension: Secondary | ICD-10-CM | POA: Diagnosis not present

## 2017-08-30 DIAGNOSIS — K7460 Unspecified cirrhosis of liver: Secondary | ICD-10-CM | POA: Insufficient documentation

## 2017-08-30 DIAGNOSIS — C649 Malignant neoplasm of unspecified kidney, except renal pelvis: Secondary | ICD-10-CM

## 2017-08-30 DIAGNOSIS — C7802 Secondary malignant neoplasm of left lung: Secondary | ICD-10-CM | POA: Diagnosis not present

## 2017-08-30 DIAGNOSIS — K4090 Unilateral inguinal hernia, without obstruction or gangrene, not specified as recurrent: Secondary | ICD-10-CM | POA: Insufficient documentation

## 2017-08-30 DIAGNOSIS — K8020 Calculus of gallbladder without cholecystitis without obstruction: Secondary | ICD-10-CM | POA: Diagnosis not present

## 2017-08-30 DIAGNOSIS — R188 Other ascites: Secondary | ICD-10-CM | POA: Diagnosis not present

## 2017-08-30 DIAGNOSIS — R590 Localized enlarged lymph nodes: Secondary | ICD-10-CM | POA: Diagnosis not present

## 2017-08-30 DIAGNOSIS — I700 Atherosclerosis of aorta: Secondary | ICD-10-CM | POA: Diagnosis not present

## 2017-08-30 MED ORDER — IOPAMIDOL (ISOVUE-300) INJECTION 61%
85.0000 mL | Freq: Once | INTRAVENOUS | Status: AC | PRN
  Administered 2017-08-30: 85 mL via INTRAVENOUS

## 2017-09-03 DIAGNOSIS — I10 Essential (primary) hypertension: Secondary | ICD-10-CM | POA: Diagnosis not present

## 2017-09-03 DIAGNOSIS — R188 Other ascites: Secondary | ICD-10-CM | POA: Diagnosis not present

## 2017-09-03 DIAGNOSIS — K7460 Unspecified cirrhosis of liver: Secondary | ICD-10-CM | POA: Diagnosis not present

## 2017-09-03 DIAGNOSIS — K429 Umbilical hernia without obstruction or gangrene: Secondary | ICD-10-CM | POA: Diagnosis not present

## 2017-09-04 ENCOUNTER — Ambulatory Visit: Payer: Medicare HMO

## 2017-09-04 ENCOUNTER — Other Ambulatory Visit: Payer: Medicare HMO

## 2017-09-04 ENCOUNTER — Ambulatory Visit: Payer: Medicare HMO | Admitting: Oncology

## 2017-09-08 NOTE — Progress Notes (Signed)
[place]  [phone] [phone]  ID: Rodney Stewart OB: [birth year]  MR#: [id number]  FGH#:[id number]  Patient Care Team: [name], [name], MD as PCP - General (Internal Medicine)  CHIEF COMPLAINT: Stage IV renal cell carcinoma with bilateral lung metastases.  INTERVAL HISTORY: Patient returns to clinic today for further evaluation, discussion of his imaging results, and treatment planning.  He fell off a ladder yesterday injuring his left shoulder, but otherwise has felt well.  He denies any abdominal bloating or pain.  He continues to have significant peripheral neuropathy in both feet secondary to diabetes, but has no other neurologic complaints. He denies any fevers, chills, or recent illnesses. He has a fair appetite.  He denies any chest pain, shortness of breath, cough, or hemoptysis. He has no nausea, vomiting, constipation, or diarrhea. He has no melena or hematochezia. He has no urinary complaints.  Patient offers no further specific complaints today.  REVIEW OF SYSTEMS:   Review of Systems  Constitutional: Negative.  Negative for fever, malaise/fatigue and weight loss.  HENT: Negative.  Negative for congestion.   Eyes: Negative for blurred vision.  Respiratory: Negative.  Negative for cough and shortness of breath.   Cardiovascular: Negative.  Negative for chest pain and leg swelling.  Gastrointestinal: Negative.  Negative for abdominal pain, constipation, diarrhea, nausea and vomiting.  Genitourinary: Negative.  Negative for dysuria.  Musculoskeletal: Positive for joint pain. Negative for back pain.  Skin: Negative.  Negative for rash.  Neurological: Positive for tingling and sensory change. Negative for focal weakness, weakness and headaches.  Psychiatric/Behavioral: Negative.  The patient is not nervous/anxious and does not have insomnia.    As per HPI. Otherwise, a complete review of systems is negative.   PAST MEDICAL  HISTORY: Past Medical History:  Diagnosis Date  . Anemia   . BPH (benign prostatic hyperplasia)   . CKD (chronic kidney disease) stage 3, GFR 30-59 ml/min (HCC)   . Clear cell renal cell carcinoma ([place]) 2014   Left Nephrectomy.  . Colon polyps   . Diabetes mellitus without complication ([place])    type 2   . Dyspnea    with exertion  . GERD (gastroesophageal reflux disease)   . History of gout   . History of nephrectomy    Left  . Hypertension   . Neuropathy   . Renal cell carcinoma of left kidney (HCC)    mets to lungs  . Umbilical hernia     PAST SURGICAL HISTORY: Past Surgical History:  Procedure Laterality Date  . BACK SURGERY     ruptured disc 1991   . ENDOBRONCHIAL ULTRASOUND N/A 10/14/2015   Procedure: ENDOBRONCHIAL ULTRASOUND;  Surgeon: [name], MD;  Location: [facility] ORS;  Service: Pulmonary;  Laterality: N/A;  . ESOPHAGOGASTRODUODENOSCOPY (EGD) WITH PROPOFOL N/A 04/15/2017   Procedure: ESOPHAGOGASTRODUODENOSCOPY (EGD) WITH PROPOFOL;  Surgeon: Jonathon Bellows, MD;  Location: [facility] ENDOSCOPY;  Service: Gastroenterology;  Laterality: N/A;  Screen for esophageal varices  . EYE SURGERY Bilateral    Cataract Extraction with IOL  . KNEE SURGERY Right   . NEPHRECTOMY     left kidney 2014 renal cell cancer     FAMILY HISTORY: Family History  Problem Relation Age of Onset  . Ovarian cancer Mother   . Diabetes Father   . Hypertension Father     ADVANCED DIRECTIVES (Y/N):  N  HEALTH MAINTENANCE: Social History   Tobacco Use  . Smoking status: Former Smoker    Packs/day: 1.00  Types: Cigarettes    Last attempt to quit: 04/15/1993    Years since quitting: 24.4  . Smokeless tobacco: Never Used  Substance Use Topics  . Alcohol use: Yes    Alcohol/week: 42.0 standard drinks    Types: 42 Cans of beer per week    Comment: 4-5 cans of beer a day  . Drug use: No     Colonoscopy:  PAP:  Bone density:  Lipid panel:  No Known Allergies  Current  Outpatient Medications  Medication Sig Dispense Refill  . acetaminophen (TYLENOL) 325 MG tablet Take 325 mg by mouth every 6 (six) hours as needed.    Marland Kitchen albuterol (PROVENTIL HFA;VENTOLIN HFA) 108 (90 Base) MCG/ACT inhaler Inhale 1-2 puffs into the lungs every 6 (six) hours as needed for wheezing or shortness of breath. 1 Inhaler 11  . allopurinol (ZYLOPRIM) 100 MG tablet Take 1 tablet (100 mg total) by mouth daily. 90 tablet 3  . ALPRAZolam (XANAX) 1 MG tablet Take 1 mg by mouth as needed for sleep.    Marland Kitchen amLODipine (NORVASC) 5 MG tablet Take 1 tablet (5 mg total) by mouth daily. 90 tablet 1  . aspirin 81 MG chewable tablet Chew 81 mg by mouth daily.    . colchicine 0.6 MG tablet Take 0.6 mg by mouth every other day.     . fluticasone (FLONASE) 50 MCG/ACT nasal spray   0  . gabapentin (NEURONTIN) 100 MG capsule Take 2 capsules (200 mg total) by mouth 3 (three) times daily. 360 capsule 5  . insulin aspart (NOVOLOG) 100 UNIT/ML FlexPen Before meals 131-180 2 units, 181-240 4 units, 241-300 6 units, 301-350 8 units, 351-400 10 units, >400 12 units 15 mL 0  . Insulin Detemir (LEVEMIR FLEXTOUCH) 100 UNIT/ML Pen Inject 40 Units into the skin daily at 10 pm. 12 pen 12  . lovastatin (MEVACOR) 20 MG tablet Take 1 tablet (20 mg total) by mouth daily at 6 PM. 90 tablet 3  . Nebivolol HCl 20 MG TABS Take 20 mg by mouth daily.    . ONE TOUCH ULTRA TEST test strip Bid 180 each 3  . Pseudoeph-Doxylamine-DM-APAP (NYQUIL PO) Take by mouth as needed.    Marland Kitchen spironolactone (ALDACTONE) 50 MG tablet Take 1 tablet (50 mg total) by mouth daily. 90 tablet 1  . tamsulosin (FLOMAX) 0.4 MG CAPS capsule Take 0.4 mg by mouth every other day.      Current Facility-Administered Medications  Medication Dose Route Frequency Provider Last Rate Last Dose  . albumin human 25 % solution 12.5 g  12.5 g Intravenous Once Jonathon Bellows, MD      . albumin human 25 % solution 12.5 g  12.5 g Intravenous Once Jonathon Bellows, MD      . albumin  human 5 % solution 25 g  25 g Intravenous Once Jonathon Bellows, MD        OBJECTIVE: Vitals:   09/11/17 1120  BP: 105/70  Pulse: 64  Resp: 18  Temp: 97.6 F (36.4 C)     Body mass index is 21.5 kg/m.    ECOG FS:0 - Asymptomatic  General: Well-developed, well-nourished, no acute distress. Eyes: Pink conjunctiva, anicteric sclera. HEENT: Normocephalic, moist mucous membranes. Lungs: Clear to auscultation bilaterally. Heart: Regular rate and rhythm. No rubs, murmurs, or gallops. Abdomen: Easily reducible abdominal hernia.  Nontender. Musculoskeletal: No edema, cyanosis, or clubbing. Neuro: Alert, answering all questions appropriately. Cranial nerves grossly intact. Skin: No rashes or petechiae noted. Psych: Normal affect.  LAB RESULTS:  Lab Results  Component Value Date   NA 119 (LL) 09/11/2017   K 5.2 (H) 09/11/2017   CL 85 (L) 09/11/2017   CO2 25 09/11/2017   GLUCOSE 69 (L) 09/11/2017   BUN 19 09/11/2017   CREATININE 1.20 09/11/2017   CALCIUM 9.0 09/11/2017   PROT 7.2 09/11/2017   ALBUMIN 3.3 (L) 09/11/2017   AST 51 (H) 09/11/2017   ALT 23 09/11/2017   ALKPHOS 417 (H) 09/11/2017   BILITOT 1.9 (H) 09/11/2017   GFRNONAA >60 09/11/2017   GFRAA >60 09/11/2017    Lab Results  Component Value Date   WBC 3.9 09/11/2017   NEUTROABS 2.4 09/11/2017   HGB 11.9 (L) 09/11/2017   HCT 35.2 (L) 09/11/2017   MCV 82.0 09/11/2017   PLT 159 09/11/2017   Lab Results  Component Value Date   IRON 13 (L) 04/03/2017   TIBC 345 04/03/2017   IRONPCTSAT 4 (L) 04/03/2017   Lab Results  Component Value Date   FERRITIN 17 (L) 04/03/2017     STUDIES: Ct Chest W Contrast  Result Date: 08/31/2017 CLINICAL DATA:  Left renal cell carcinoma with left lung metastases. On chemotherapy. Worsening fatigue and shortness of breath on exertion. EXAM: CT CHEST, ABDOMEN, AND PELVIS WITH CONTRAST TECHNIQUE: Multidetector CT imaging of the chest, abdomen and pelvis was performed following the  standard protocol during bolus administration of intravenous contrast. CONTRAST:  38mL ISOVUE-300 IOPAMIDOL (ISOVUE-300) INJECTION 61% COMPARISON:  06/07/2017. FINDINGS: CT CHEST FINDINGS Cardiovascular: Atherosclerotic calcification of the arterial vasculature, including coronary arteries. Heart size normal. No pericardial effusion. Mediastinum/Nodes: Subcarinal lymph node measures 2.4 cm, stable when remeasured. Bihilar lymph nodes measure up to 3.0 x 3.0 cm on the left, also stable when remeasured. No axillary adenopathy. Esophagus is grossly unremarkable. Lungs/Pleura: Image quality is degraded by motion. Hematogenously distributed pulmonary nodules are stable in size and number, measuring up to 2.4 x 2.8 cm in the left lower lobe (series 3, image 87). No pleural fluid. There is narrowing of the left lower lobe bronchus due to extrinsic mass effect from left hilar adenopathy. Musculoskeletal: No worrisome lytic or sclerotic lesions. CT ABDOMEN PELVIS FINDINGS Hepatobiliary: Liver margin is irregular. Stones are seen in the gallbladder. No biliary ductal dilatation. Pancreas: Negative. Spleen: Measures 18.4 cm. Adrenals/Urinary Tract: Right adrenal gland and right kidney are unremarkable. Left adrenalectomy and left nephrectomy. Bladder is low in volume. Stomach/Bowel: Stomach, small bowel and majority of the colon are unremarkable. Difficult to exclude a polypoid lesion in the proximal descending colon (series 2, image 65). Vascular/Lymphatic: Atherosclerotic calcification of the arterial vasculature. Numerous varices. Scattered lymph nodes are not enlarged by CT size criteria. Reproductive: Prostate is visualized.  Bilateral scrotal hydroceles. Other: Massive ascites, increased from 06/07/2017. Midline ventral hernias contain fluid and varices. Large left inguinal hernia contains fluid. Musculoskeletal: No worrisome lytic or sclerotic lesions. IMPRESSION: 1. Mediastinal/hilar adenopathy and pulmonary metastatic  disease, stable from 06/07/2017. 2. Very large ascites, increased from 06/07/2017. 3. Cirrhosis with portal hypertension. 4. Difficult to exclude a polypoid lesion in the proximal descending colon. 5. Cholelithiasis. 6. Large ventral and left inguinal hernias containing fluid. 7.  Aortic atherosclerosis (ICD10-170.0). Electronically Signed   By: Lorin Picket M.D.   On: 08/31/2017 11:43   Ct Abdomen Pelvis W Contrast  Result Date: 08/31/2017 CLINICAL DATA:  Left renal cell carcinoma with left lung metastases. On chemotherapy. Worsening fatigue and shortness of breath on exertion. EXAM: CT CHEST, ABDOMEN, AND PELVIS WITH CONTRAST TECHNIQUE: Multidetector  CT imaging of the chest, abdomen and pelvis was performed following the standard protocol during bolus administration of intravenous contrast. CONTRAST:  24mL ISOVUE-300 IOPAMIDOL (ISOVUE-300) INJECTION 61% COMPARISON:  06/07/2017. FINDINGS: CT CHEST FINDINGS Cardiovascular: Atherosclerotic calcification of the arterial vasculature, including coronary arteries. Heart size normal. No pericardial effusion. Mediastinum/Nodes: Subcarinal lymph node measures 2.4 cm, stable when remeasured. Bihilar lymph nodes measure up to 3.0 x 3.0 cm on the left, also stable when remeasured. No axillary adenopathy. Esophagus is grossly unremarkable. Lungs/Pleura: Image quality is degraded by motion. Hematogenously distributed pulmonary nodules are stable in size and number, measuring up to 2.4 x 2.8 cm in the left lower lobe (series 3, image 87). No pleural fluid. There is narrowing of the left lower lobe bronchus due to extrinsic mass effect from left hilar adenopathy. Musculoskeletal: No worrisome lytic or sclerotic lesions. CT ABDOMEN PELVIS FINDINGS Hepatobiliary: Liver margin is irregular. Stones are seen in the gallbladder. No biliary ductal dilatation. Pancreas: Negative. Spleen: Measures 18.4 cm. Adrenals/Urinary Tract: Right adrenal gland and right kidney are  unremarkable. Left adrenalectomy and left nephrectomy. Bladder is low in volume. Stomach/Bowel: Stomach, small bowel and majority of the colon are unremarkable. Difficult to exclude a polypoid lesion in the proximal descending colon (series 2, image 65). Vascular/Lymphatic: Atherosclerotic calcification of the arterial vasculature. Numerous varices. Scattered lymph nodes are not enlarged by CT size criteria. Reproductive: Prostate is visualized.  Bilateral scrotal hydroceles. Other: Massive ascites, increased from 06/07/2017. Midline ventral hernias contain fluid and varices. Large left inguinal hernia contains fluid. Musculoskeletal: No worrisome lytic or sclerotic lesions. IMPRESSION: 1. Mediastinal/hilar adenopathy and pulmonary metastatic disease, stable from 06/07/2017. 2. Very large ascites, increased from 06/07/2017. 3. Cirrhosis with portal hypertension. 4. Difficult to exclude a polypoid lesion in the proximal descending colon. 5. Cholelithiasis. 6. Large ventral and left inguinal hernias containing fluid. 7.  Aortic atherosclerosis (ICD10-170.0). Electronically Signed   By: Lorin Picket M.D.   On: 08/31/2017 11:43     Oncology history: Patient underwent left nephrectomy on May 16, 2012 which revealed a clear cell grade 2 renal cell carcinoma, stage TIIIa, N0, M0. Tumor size of 16 cm. Patient was noted to have renal vein involvement, but no other structures were involved. 0 of 2 lymph nodes were negative for disease. PET scan on October 20, 2015 revealed metastatic disease and patient was initiated on Votrient. This was subsequently discontinued in March 2018 secondary to progression of disease. Patient initiated second line treatment with nivolumab on Monday, April 02, 2016  ASSESSMENT: Stage IV left renal cell carcinoma with metastasis to the lungs  PLAN:    1. Stage IV left renal cell carcinoma with metastasis to the lungs: CT scan results from August 30, 2017 reviewed independently and report  as above with stable mediastinal/hilar adenopathy as well as pulmonary metastatic disease.  Patient continues to have large ascites.  Proceed with patient's next infusion of nivolumab today.  He will receive a flat dose of 240 mg every 2 weeks until intolerable side effects or significant progression of disease.  Return to clinic in 2 weeks for nivolumab only and then in 4 weeks for further evaluation and continuation of treatment.  Will reimage in approximately 4 months in December 2019.   2. Pancytopenia: Nearly resolved.  Patient only has a persistent anemia that is mild with a hemoglobin 11.9. 3. Renal insufficiency: Patient's creatinine is within normal limits today.  Monitor.  4. Peripheral neuropathy: Chronic and unchanged.  Likely secondary to diabetes, continue  current dose of gabapentin.  Continue evaluation and treatment per primary care. 5.  Cirrhosis/ascites: Appreciate GI input.  CT scan results as above.  Patient continues to require periodic paracentesis.  Continue monitoring and treatment per GI.   5. Hyperbilirubinemia: Secondary to cirrhosis.  Chronic and unchanged.  Bilirubin is 1.9 today.  Proceed with treatment as above and follow-up with GI as scheduled.   6.  Hypoglycemia: Patient usually presents with hyperglycemia, therefore he was given a snack to improve his blood glucose. 7.  Hyponatremia: Significantly worse today.  Patient will receive 1 L IV fluids. 8.  Hyperkalemia: Improved, 1 L fluids as above.  Patient expressed understanding and was in agreement with this plan. He also understands that He can call clinic at any time with any questions, concerns, or complaints.   Cancer Staging Metastatic renal cell carcinoma to lung [facility]) Staging form: Kidney, AJCC 7th Edition - Clinical stage from 10/12/2015: Stage IV (TX, N0, M1) - Signed by Lloyd Huger, MD on 10/12/2015   Lloyd Huger, MD 09/13/17 5:24 PM

## 2017-09-10 ENCOUNTER — Other Ambulatory Visit: Payer: Self-pay | Admitting: Oncology

## 2017-09-11 ENCOUNTER — Inpatient Hospital Stay (HOSPITAL_BASED_OUTPATIENT_CLINIC_OR_DEPARTMENT_OTHER): Payer: Medicare HMO | Admitting: Oncology

## 2017-09-11 ENCOUNTER — Inpatient Hospital Stay: Payer: Medicare HMO | Attending: Oncology

## 2017-09-11 ENCOUNTER — Encounter: Payer: Self-pay | Admitting: Oncology

## 2017-09-11 ENCOUNTER — Inpatient Hospital Stay: Payer: Medicare HMO

## 2017-09-11 VITALS — BP 105/70 | HR 64 | Temp 97.6°F | Resp 18 | Wt 154.1 lb

## 2017-09-11 DIAGNOSIS — C7802 Secondary malignant neoplasm of left lung: Secondary | ICD-10-CM

## 2017-09-11 DIAGNOSIS — M109 Gout, unspecified: Secondary | ICD-10-CM | POA: Diagnosis not present

## 2017-09-11 DIAGNOSIS — K219 Gastro-esophageal reflux disease without esophagitis: Secondary | ICD-10-CM | POA: Insufficient documentation

## 2017-09-11 DIAGNOSIS — C642 Malignant neoplasm of left kidney, except renal pelvis: Secondary | ICD-10-CM

## 2017-09-11 DIAGNOSIS — E1140 Type 2 diabetes mellitus with diabetic neuropathy, unspecified: Secondary | ICD-10-CM | POA: Diagnosis not present

## 2017-09-11 DIAGNOSIS — E871 Hypo-osmolality and hyponatremia: Secondary | ICD-10-CM

## 2017-09-11 DIAGNOSIS — R590 Localized enlarged lymph nodes: Secondary | ICD-10-CM

## 2017-09-11 DIAGNOSIS — Z794 Long term (current) use of insulin: Secondary | ICD-10-CM | POA: Insufficient documentation

## 2017-09-11 DIAGNOSIS — K7460 Unspecified cirrhosis of liver: Secondary | ICD-10-CM

## 2017-09-11 DIAGNOSIS — Z5112 Encounter for antineoplastic immunotherapy: Secondary | ICD-10-CM | POA: Diagnosis not present

## 2017-09-11 DIAGNOSIS — Z79899 Other long term (current) drug therapy: Secondary | ICD-10-CM | POA: Insufficient documentation

## 2017-09-11 DIAGNOSIS — E1122 Type 2 diabetes mellitus with diabetic chronic kidney disease: Secondary | ICD-10-CM | POA: Insufficient documentation

## 2017-09-11 DIAGNOSIS — Z87891 Personal history of nicotine dependence: Secondary | ICD-10-CM | POA: Diagnosis not present

## 2017-09-11 DIAGNOSIS — Z7982 Long term (current) use of aspirin: Secondary | ICD-10-CM | POA: Insufficient documentation

## 2017-09-11 DIAGNOSIS — D649 Anemia, unspecified: Secondary | ICD-10-CM

## 2017-09-11 DIAGNOSIS — R188 Other ascites: Secondary | ICD-10-CM | POA: Diagnosis not present

## 2017-09-11 DIAGNOSIS — N400 Enlarged prostate without lower urinary tract symptoms: Secondary | ICD-10-CM | POA: Diagnosis not present

## 2017-09-11 DIAGNOSIS — N183 Chronic kidney disease, stage 3 (moderate): Secondary | ICD-10-CM | POA: Insufficient documentation

## 2017-09-11 DIAGNOSIS — E875 Hyperkalemia: Secondary | ICD-10-CM | POA: Insufficient documentation

## 2017-09-11 DIAGNOSIS — D61818 Other pancytopenia: Secondary | ICD-10-CM

## 2017-09-11 DIAGNOSIS — C7801 Secondary malignant neoplasm of right lung: Secondary | ICD-10-CM | POA: Insufficient documentation

## 2017-09-11 DIAGNOSIS — C649 Malignant neoplasm of unspecified kidney, except renal pelvis: Secondary | ICD-10-CM

## 2017-09-11 DIAGNOSIS — Z905 Acquired absence of kidney: Secondary | ICD-10-CM | POA: Diagnosis not present

## 2017-09-11 DIAGNOSIS — I700 Atherosclerosis of aorta: Secondary | ICD-10-CM | POA: Diagnosis not present

## 2017-09-11 DIAGNOSIS — K8020 Calculus of gallbladder without cholecystitis without obstruction: Secondary | ICD-10-CM | POA: Diagnosis not present

## 2017-09-11 DIAGNOSIS — I129 Hypertensive chronic kidney disease with stage 1 through stage 4 chronic kidney disease, or unspecified chronic kidney disease: Secondary | ICD-10-CM | POA: Insufficient documentation

## 2017-09-11 LAB — COMPREHENSIVE METABOLIC PANEL
ALK PHOS: 417 U/L — AB (ref 38–126)
ALT: 23 U/L (ref 0–44)
AST: 51 U/L — ABNORMAL HIGH (ref 15–41)
Albumin: 3.3 g/dL — ABNORMAL LOW (ref 3.5–5.0)
Anion gap: 9 (ref 5–15)
BILIRUBIN TOTAL: 1.9 mg/dL — AB (ref 0.3–1.2)
BUN: 19 mg/dL (ref 8–23)
CHLORIDE: 85 mmol/L — AB (ref 98–111)
CO2: 25 mmol/L (ref 22–32)
CREATININE: 1.2 mg/dL (ref 0.61–1.24)
Calcium: 9 mg/dL (ref 8.9–10.3)
GFR calc Af Amer: >60 mL/min (ref >60)
GFR calc non Af Amer: >60 mL/min (ref >60)
Glucose, Bld: 69 mg/dL — ABNORMAL LOW (ref 70–99)
Potassium: 5.2 mmol/L — ABNORMAL HIGH (ref 3.5–5.1)
SODIUM: 119 mmol/L — AB (ref 135–145)
Total Protein: 7.2 g/dL (ref 6.5–8.1)

## 2017-09-11 LAB — CBC WITH DIFFERENTIAL/PLATELET
Basophils Absolute: 0 10*3/uL (ref 0–0.1)
Basophils Relative: 1 %
Eosinophils Absolute: 0.2 10*3/uL (ref 0–0.7)
Eosinophils Relative: 6 %
HEMATOCRIT: 35.2 % — AB (ref 40.0–52.0)
HEMOGLOBIN: 11.9 g/dL — AB (ref 13.0–18.0)
LYMPHS ABS: 0.7 10*3/uL — AB (ref 1.0–3.6)
LYMPHS PCT: 19 %
MCH: 27.7 pg (ref 26.0–34.0)
MCHC: 33.7 g/dL (ref 32.0–36.0)
MCV: 82 fL (ref 80.0–100.0)
Monocytes Absolute: 0.5 10*3/uL (ref 0.2–1.0)
Monocytes Relative: 13 %
NEUTROS ABS: 2.4 10*3/uL (ref 1.4–6.5)
NEUTROS PCT: 61 %
Platelets: 159 10*3/uL (ref 150–440)
RBC: 4.29 MIL/uL — AB (ref 4.40–5.90)
RDW: 15 % — ABNORMAL HIGH (ref 11.5–14.5)
WBC: 3.9 10*3/uL (ref 3.8–10.6)

## 2017-09-11 MED ORDER — SODIUM CHLORIDE 0.9 % IV SOLN
Freq: Once | INTRAVENOUS | Status: AC
  Administered 2017-09-11: 13:00:00 via INTRAVENOUS
  Filled 2017-09-11: qty 250

## 2017-09-11 MED ORDER — SODIUM CHLORIDE 0.9 % IV SOLN
Freq: Once | INTRAVENOUS | Status: AC
  Administered 2017-09-11: 12:00:00 via INTRAVENOUS
  Filled 2017-09-11: qty 250

## 2017-09-11 MED ORDER — SODIUM CHLORIDE 0.9 % IV SOLN
240.0000 mg | Freq: Once | INTRAVENOUS | Status: AC
  Administered 2017-09-11: 240 mg via INTRAVENOUS
  Filled 2017-09-11: qty 24

## 2017-09-11 NOTE — Progress Notes (Signed)
Pt in for follow up, reports fell yesterday, was changing ladder and got to last step on ladder and got "tangled up".  Pt injured left shoulder, left elbow, knees and ribs on left side.  Pt also reported "soreness in mouth and inside of lips, "making it hard to eat".

## 2017-09-12 LAB — THYROID PANEL WITH TSH
FREE THYROXINE INDEX: 2.7 (ref 1.2–4.9)
T3 UPTAKE RATIO: 31 % (ref 24–39)
T4, Total: 8.8 ug/dL (ref 4.5–12.0)
TSH: 9.56 u[IU]/mL — ABNORMAL HIGH (ref 0.450–4.500)

## 2017-09-13 ENCOUNTER — Emergency Department: Payer: Medicare HMO

## 2017-09-13 ENCOUNTER — Inpatient Hospital Stay
Admission: EM | Admit: 2017-09-13 | Discharge: 2017-09-18 | DRG: 432 | Disposition: A | Discharge status: Home / Self Care | Payer: Medicare HMO | Attending: Internal Medicine | Admitting: Internal Medicine

## 2017-09-13 ENCOUNTER — Encounter: Payer: Self-pay | Admitting: Radiology

## 2017-09-13 ENCOUNTER — Other Ambulatory Visit: Payer: Self-pay

## 2017-09-13 DIAGNOSIS — I129 Hypertensive chronic kidney disease with stage 1 through stage 4 chronic kidney disease, or unspecified chronic kidney disease: Secondary | ICD-10-CM | POA: Diagnosis present

## 2017-09-13 DIAGNOSIS — R188 Other ascites: Secondary | ICD-10-CM

## 2017-09-13 DIAGNOSIS — Z681 Body mass index (BMI) 19 or less, adult: Secondary | ICD-10-CM

## 2017-09-13 DIAGNOSIS — F1020 Alcohol dependence, uncomplicated: Secondary | ICD-10-CM | POA: Diagnosis present

## 2017-09-13 DIAGNOSIS — K429 Umbilical hernia without obstruction or gangrene: Secondary | ICD-10-CM | POA: Diagnosis not present

## 2017-09-13 DIAGNOSIS — E1140 Type 2 diabetes mellitus with diabetic neuropathy, unspecified: Secondary | ICD-10-CM | POA: Diagnosis present

## 2017-09-13 DIAGNOSIS — N179 Acute kidney failure, unspecified: Secondary | ICD-10-CM | POA: Diagnosis not present

## 2017-09-13 DIAGNOSIS — N183 Chronic kidney disease, stage 3 (moderate): Secondary | ICD-10-CM | POA: Diagnosis not present

## 2017-09-13 DIAGNOSIS — R809 Proteinuria, unspecified: Secondary | ICD-10-CM | POA: Diagnosis not present

## 2017-09-13 DIAGNOSIS — D696 Thrombocytopenia, unspecified: Secondary | ICD-10-CM | POA: Diagnosis present

## 2017-09-13 DIAGNOSIS — Z515 Encounter for palliative care: Secondary | ICD-10-CM | POA: Diagnosis not present

## 2017-09-13 DIAGNOSIS — E1165 Type 2 diabetes mellitus with hyperglycemia: Secondary | ICD-10-CM | POA: Diagnosis present

## 2017-09-13 DIAGNOSIS — C649 Malignant neoplasm of unspecified kidney, except renal pelvis: Secondary | ICD-10-CM | POA: Diagnosis present

## 2017-09-13 DIAGNOSIS — K766 Portal hypertension: Secondary | ICD-10-CM | POA: Diagnosis present

## 2017-09-13 DIAGNOSIS — Z7189 Other specified counseling: Secondary | ICD-10-CM | POA: Diagnosis not present

## 2017-09-13 DIAGNOSIS — E871 Hypo-osmolality and hyponatremia: Secondary | ICD-10-CM | POA: Diagnosis not present

## 2017-09-13 DIAGNOSIS — K219 Gastro-esophageal reflux disease without esophagitis: Secondary | ICD-10-CM | POA: Diagnosis present

## 2017-09-13 DIAGNOSIS — K8020 Calculus of gallbladder without cholecystitis without obstruction: Secondary | ICD-10-CM | POA: Diagnosis not present

## 2017-09-13 DIAGNOSIS — M109 Gout, unspecified: Secondary | ICD-10-CM | POA: Diagnosis present

## 2017-09-13 DIAGNOSIS — K7210 Chronic hepatic failure without coma: Secondary | ICD-10-CM | POA: Diagnosis present

## 2017-09-13 DIAGNOSIS — Z66 Do not resuscitate: Secondary | ICD-10-CM | POA: Diagnosis present

## 2017-09-13 DIAGNOSIS — E11649 Type 2 diabetes mellitus with hypoglycemia without coma: Secondary | ICD-10-CM | POA: Diagnosis not present

## 2017-09-13 DIAGNOSIS — Z23 Encounter for immunization: Secondary | ICD-10-CM | POA: Diagnosis not present

## 2017-09-13 DIAGNOSIS — N400 Enlarged prostate without lower urinary tract symptoms: Secondary | ICD-10-CM | POA: Diagnosis present

## 2017-09-13 DIAGNOSIS — Z9221 Personal history of antineoplastic chemotherapy: Secondary | ICD-10-CM

## 2017-09-13 DIAGNOSIS — C7802 Secondary malignant neoplasm of left lung: Secondary | ICD-10-CM | POA: Diagnosis present

## 2017-09-13 DIAGNOSIS — Z86010 Personal history of colonic polyps: Secondary | ICD-10-CM

## 2017-09-13 DIAGNOSIS — J3501 Chronic tonsillitis: Secondary | ICD-10-CM | POA: Diagnosis present

## 2017-09-13 DIAGNOSIS — R571 Hypovolemic shock: Secondary | ICD-10-CM | POA: Diagnosis not present

## 2017-09-13 DIAGNOSIS — Z905 Acquired absence of kidney: Secondary | ICD-10-CM

## 2017-09-13 DIAGNOSIS — E43 Unspecified severe protein-calorie malnutrition: Secondary | ICD-10-CM | POA: Diagnosis present

## 2017-09-13 DIAGNOSIS — D631 Anemia in chronic kidney disease: Secondary | ICD-10-CM | POA: Diagnosis not present

## 2017-09-13 DIAGNOSIS — K7460 Unspecified cirrhosis of liver: Secondary | ICD-10-CM

## 2017-09-13 DIAGNOSIS — C7801 Secondary malignant neoplasm of right lung: Secondary | ICD-10-CM | POA: Diagnosis present

## 2017-09-13 DIAGNOSIS — K7031 Alcoholic cirrhosis of liver with ascites: Principal | ICD-10-CM | POA: Diagnosis present

## 2017-09-13 DIAGNOSIS — E1122 Type 2 diabetes mellitus with diabetic chronic kidney disease: Secondary | ICD-10-CM | POA: Diagnosis present

## 2017-09-13 DIAGNOSIS — K767 Hepatorenal syndrome: Secondary | ICD-10-CM | POA: Diagnosis present

## 2017-09-13 DIAGNOSIS — E875 Hyperkalemia: Secondary | ICD-10-CM | POA: Diagnosis not present

## 2017-09-13 DIAGNOSIS — R161 Splenomegaly, not elsewhere classified: Secondary | ICD-10-CM | POA: Diagnosis present

## 2017-09-13 HISTORY — DX: Malignant neoplasm of bladder, unspecified: C67.9

## 2017-09-13 LAB — HEPATIC FUNCTION PANEL
ALK PHOS: 444 U/L — AB (ref 38–126)
ALT: 27 U/L (ref 0–44)
AST: 52 U/L — AB (ref 15–41)
Albumin: 3 g/dL — ABNORMAL LOW (ref 3.5–5.0)
BILIRUBIN DIRECT: 0.7 mg/dL — AB (ref 0.0–0.2)
BILIRUBIN TOTAL: 1.7 mg/dL — AB (ref 0.3–1.2)
Indirect Bilirubin: 1 mg/dL — ABNORMAL HIGH (ref 0.3–0.9)
Total Protein: 6.6 g/dL (ref 6.5–8.1)

## 2017-09-13 LAB — BASIC METABOLIC PANEL
Anion gap: 7 (ref 5–15)
BUN: 19 mg/dL (ref 8–23)
CALCIUM: 8.2 mg/dL — AB (ref 8.9–10.3)
CO2: 25 mmol/L (ref 22–32)
Chloride: 85 mmol/L — ABNORMAL LOW (ref 98–111)
Creatinine, Ser: 1.35 mg/dL — ABNORMAL HIGH (ref 0.61–1.24)
GFR calc Af Amer: >60 mL/min (ref >60)
GFR calc non Af Amer: 55 mL/min — ABNORMAL LOW (ref >60)
GLUCOSE: 243 mg/dL — AB (ref 70–99)
POTASSIUM: 5.2 mmol/L — AB (ref 3.5–5.1)
Sodium: 117 mmol/L — CL (ref 135–145)

## 2017-09-13 LAB — CBC WITH DIFFERENTIAL/PLATELET
BASOS PCT: 1 %
Basophils Absolute: 0 10*3/uL (ref 0–0.1)
EOS ABS: 0.2 10*3/uL (ref 0–0.7)
EOS PCT: 6 %
HCT: 35.9 % — ABNORMAL LOW (ref 40.0–52.0)
Hemoglobin: 12 g/dL — ABNORMAL LOW (ref 13.0–18.0)
Lymphocytes Relative: 17 %
Lymphs Abs: 0.5 10*3/uL — ABNORMAL LOW (ref 1.0–3.6)
MCH: 28 pg (ref 26.0–34.0)
MCHC: 33.4 g/dL (ref 32.0–36.0)
MCV: 83.9 fL (ref 80.0–100.0)
MONOS PCT: 11 %
Monocytes Absolute: 0.4 10*3/uL (ref 0.2–1.0)
Neutro Abs: 2.2 10*3/uL (ref 1.4–6.5)
Neutrophils Relative %: 65 %
Platelets: 137 10*3/uL — ABNORMAL LOW (ref 150–440)
RBC: 4.28 MIL/uL — ABNORMAL LOW (ref 4.40–5.90)
RDW: 15.6 % — ABNORMAL HIGH (ref 11.5–14.5)
WBC: 3.3 10*3/uL — ABNORMAL LOW (ref 3.8–10.6)

## 2017-09-13 LAB — PROTIME-INR
INR: 1.15
PROTHROMBIN TIME: 14.6 s (ref 11.4–15.2)

## 2017-09-13 MED ORDER — SODIUM CHLORIDE 0.9 % IV BOLUS
1000.0000 mL | Freq: Once | INTRAVENOUS | Status: AC
  Administered 2017-09-13: 1000 mL via INTRAVENOUS

## 2017-09-13 MED ORDER — IOPAMIDOL (ISOVUE-300) INJECTION 61%
100.0000 mL | Freq: Once | INTRAVENOUS | Status: AC | PRN
  Administered 2017-09-13: 100 mL via INTRAVENOUS

## 2017-09-13 NOTE — ED Provider Notes (Signed)
[facility] Emergency Department Provider Note ____________________________________________   First MD Initiated Contact with Patient 09/13/17 2132     (approximate)  I have reviewed the triage vital signs and the nursing notes.   HISTORY  Chief Complaint hernia leaking    HPI Rodney Stewart is a 62 y.o. male with PMH as noted below including liver failure and chronic ascites who presents with drainage from a wound in his abdomen, acute onset this evening, and now somewhat improving.  The patient states that he has had an umbilical hernia for a long time.  He states he developed a scab on it in the last few weeks although he states it was getting smaller and appeared to be healing, and then started to drain clear fluid this evening.  He denies any fever chills, rash, abdominal pain, or other acute symptoms.  He denies any prior history of this.  Past Medical History:  Diagnosis Date  . Anemia   . BPH (benign prostatic hyperplasia)   . CKD (chronic kidney disease) stage 3, GFR 30-59 ml/min (HCC)   . Clear cell renal cell carcinoma ([place]) 2014   Left Nephrectomy.  . Colon polyps   . Diabetes mellitus without complication ([place])    type 2   . Dyspnea    with exertion  . GERD (gastroesophageal reflux disease)   . History of gout   . History of nephrectomy    Left  . Hypertension   . Neuropathy   . Renal cell carcinoma of left kidney (HCC)    mets to lungs  . Umbilical hernia     Patient Active Problem List   Diagnosis Date Noted  . Hyponatremia 05/28/2017  . Colon polyps 04/26/2017  . Umbilical hernia 46/96/2952  . Left inguinal hernia 04/26/2017  . Gout 04/26/2017  . Fatty liver 04/26/2017  . Leukopenia 04/26/2017  . Gallstones 04/26/2017  . Type 2 diabetes mellitus with diabetic neuropathy, unspecified ([place]) 04/26/2017  . CAD (coronary artery disease) 04/26/2017  . Chronic knee pain 04/26/2017  . Protein-calorie malnutrition ([place]) 04/26/2017   . Cirrhosis ([place]) 02/17/2017  . Neutropenia associated with infection ([place])   . Malignant neoplasm of kidney ([place])   . Thrombocytopenia ([place])   . Iron deficiency anemia 01/23/2017  . Goals of care, counseling/discussion 11/27/2016  . BPH (benign prostatic hyperplasia) 10/27/2016  . Effusion of left olecranon bursa 10/27/2016  . Foreign body in left ear 10/27/2016  . Pancytopenia ([place]) 10/27/2016  . Alcohol abuse 08/28/2016  . Nonimmune to hepatitis B virus 08/02/2016  . Metastatic renal cell carcinoma to lung ([place])   . ARF (acute renal failure) ([place]) 09/30/2015  . Type 2 diabetes mellitus with moderate nonproliferative diabetic retinopathy and without macular edema ([place]) 09/17/2014  . Cataract 08/20/2014  . Hypertension 07/02/2014  . CKD (chronic kidney disease) stage 3, GFR 30-59 ml/min (HCC) 11/30/2013  . Single kidney 02/04/2013  . Renal cell carcinoma ([place]) 01/02/2012    Past Surgical History:  Procedure Laterality Date  . BACK SURGERY     ruptured disc 1991   . ENDOBRONCHIAL ULTRASOUND N/A 10/14/2015   Procedure: ENDOBRONCHIAL ULTRASOUND;  Surgeon: [name], MD;  Location: [facility] ORS;  Service: Pulmonary;  Laterality: N/A;  . ESOPHAGOGASTRODUODENOSCOPY (EGD) WITH PROPOFOL N/A 04/15/2017   Procedure: ESOPHAGOGASTRODUODENOSCOPY (EGD) WITH PROPOFOL;  Surgeon: Jonathon Bellows, MD;  Location: [facility] ENDOSCOPY;  Service: Gastroenterology;  Laterality: N/A;  Screen for esophageal varices  . EYE SURGERY Bilateral    Cataract Extraction with  IOL  . KNEE SURGERY Right   . NEPHRECTOMY     left kidney 2014 renal cell cancer     Prior to Admission medications   Medication Sig Start Date End Date Taking? Authorizing Provider  acetaminophen (TYLENOL) 325 MG tablet Take 325 mg by mouth every 6 (six) hours as needed.    [provider]  albuterol (PROVENTIL HFA;VENTOLIN HFA) 108 (90 Base) MCG/ACT inhaler Inhale 1-2 puffs into the lungs every 6 (six) hours as needed for  wheezing or shortness of breath. 05/28/17   [name], [name], MD  allopurinol (ZYLOPRIM) 100 MG tablet Take 1 tablet (100 mg total) by mouth daily. 08/27/17   [name], [name], MD  ALPRAZolam Duanne Moron) 1 MG tablet Take 1 mg by mouth as needed for sleep. 06/10/16   [provider]  amLODipine (NORVASC) 5 MG tablet Take 1 tablet (5 mg total) by mouth daily. 04/26/17   [name], [name], MD  aspirin 81 MG chewable tablet Chew 81 mg by mouth daily.    [provider]  colchicine 0.6 MG tablet Take 0.6 mg by mouth every other day.  06/26/15   [provider]  fluticasone Asencion Islam) 50 MCG/ACT nasal spray  03/26/17   [provider]  gabapentin (NEURONTIN) 100 MG capsule Take 2 capsules (200 mg total) by mouth 3 (three) times daily. 04/26/17   [name], [name], MD  insulin aspart (NOVOLOG) 100 UNIT/ML FlexPen Before meals 131-180 2 units, 181-240 4 units, 241-300 6 units, 301-350 8 units, 351-400 10 units, >400 12 units 04/26/17   [name], [name], MD  Insulin Detemir (LEVEMIR FLEXTOUCH) 100 UNIT/ML Pen Inject 40 Units into the skin daily at 10 pm. 05/28/17   [name], [name], MD  lovastatin (MEVACOR) 20 MG tablet Take 1 tablet (20 mg total) by mouth daily at 6 PM. 08/08/17   [name], [name], MD  Nebivolol HCl 20 MG TABS Take 20 mg by mouth daily.    [provider]  ONE TOUCH ULTRA TEST test strip Bid 05/28/17   [name], [name], MD  Pseudoeph-Doxylamine-DM-APAP (NYQUIL PO) Take by mouth as needed.    [provider]  spironolactone (ALDACTONE) 50 MG tablet Take 1 tablet (50 mg total) by mouth daily. 08/16/17   Jonathon Bellows, MD  tamsulosin (FLOMAX) 0.4 MG CAPS capsule Take 0.4 mg by mouth every other day.  08/02/15   [provider]    Allergies Patient has no known allergies.  Family History  Problem Relation Age of Onset  . Ovarian cancer Mother   . Diabetes Father   . Hypertension Father      Social History Social History   Tobacco Use  . Smoking status: Former Smoker    Packs/day: 1.00    Types: Cigarettes    Last attempt to quit: 04/15/1993    Years since quitting: 24.4  . Smokeless tobacco: Never Used  Substance Use Topics  . Alcohol use: Yes    Alcohol/week: 42.0 standard drinks    Types: 42 Cans of beer per week    Comment: 4-5 cans of beer a day  . Drug use: No    Review of Systems  Constitutional: No fever. Eyes: No redness. ENT: No sore throat. Cardiovascular: Denies chest pain. Respiratory: Denies shortness of breath. Gastrointestinal: No vomiting or abdominal pain.  Genitourinary: Negative for dysuria.  Musculoskeletal: Negative for back pain. Skin: Negative for rash. Neurological: Negative for headache.   ____________________________________________   PHYSICAL EXAM:  VITAL SIGNS:  ED Triage Vitals [09/13/17 2129]  Enc Vitals Group     BP 99/72     Pulse Rate (!) 57     Resp 16     Temp 97.9 F (36.6 C)     Temp Source Oral     SpO2 100 %     Weight 158 lb (71.7 kg)     Height 5\' 11"  (1.803 m)     Head Circumference      Peak Flow      Pain Score 0     Pain Loc      Pain Edu?      Excl. in [place]?     Constitutional: Alert and oriented.  Relatively well appearing and in no acute distress. Eyes: Conjunctivae are normal.  No scleral icterus. Head: Atraumatic. Nose: No congestion/rhinnorhea. Mouth/Throat: Mucous membranes are moist.   Neck: Normal range of motion.  Cardiovascular:  Good peripheral circulation. Respiratory: Normal respiratory effort. Gastrointestinal: Distended.  Soft and nontender.  Large approximately 8 cm bulging umbilical hernia which is soft and completely reducible.  1 cm scabbed ulceration with small amount of clear/yellow fluid draining from it. Genitourinary: No flank tenderness. Musculoskeletal:  Extremities warm and well perfused.  Neurologic:  Normal speech and language. No gross focal neurologic  deficits are appreciated.  Skin:  Skin is warm and dry. No rash noted. Psychiatric: Mood and affect are normal. Speech and behavior are normal.  ____________________________________________   LABS (all labs ordered are listed, but only abnormal results are displayed)  Labs Reviewed  BASIC METABOLIC PANEL - Abnormal; Notable for the following components:      Result Value   Sodium 117 (*)    Potassium 5.2 (*)    Chloride 85 (*)    Glucose, Bld 243 (*)    Creatinine, Ser 1.35 (*)    Calcium 8.2 (*)    GFR calc non Af Amer 55 (*)    All other components within normal limits  HEPATIC FUNCTION PANEL - Abnormal; Notable for the following components:   Albumin 3.0 (*)    AST 52 (*)    Alkaline Phosphatase 444 (*)    Total Bilirubin 1.7 (*)    Bilirubin, Direct 0.7 (*)    Indirect Bilirubin 1.0 (*)    All other components within normal limits  CBC WITH DIFFERENTIAL/PLATELET - Abnormal; Notable for the following components:   WBC 3.3 (*)    RBC 4.28 (*)    Hemoglobin 12.0 (*)    HCT 35.9 (*)    RDW 15.6 (*)    Platelets 137 (*)    Lymphs Abs 0.5 (*)    All other components within normal limits  PROTIME-INR   ____________________________________________  EKG   ____________________________________________  RADIOLOGY  CT abdomen: Loculated fluid collection in the umbilical hernia  ____________________________________________   PROCEDURES  Procedure(s) performed: No  Procedures  Critical Care performed: No ____________________________________________   INITIAL IMPRESSION / ASSESSMENT AND PLAN / ED COURSE  Pertinent labs & imaging results that were available during my care of the patient were reviewed by me and considered in my medical decision making (see chart for details).  62 year old male with history of liver failure and ascites as well as long-standing history of umbilical hernia presents with drainage of clear/yellow fluid from a small scab on the  umbilical hernia over the last several hours.  He denies abdominal pain, fever, or other acute symptoms.  On exam, the vital signs are normal (patient's blood pressure is  on the low side but this is normal for him), and other than the hernia and the remainder of the exam is unremarkable for acute findings.  The hernia is soft and reducible and appears contiguous with his abdominal ascites.  There is a small amount of fluid draining from it.  I am most concerned for ascites fluid draining out of this skin ulceration.  Abscess would be less likely given that the fluid does not appear purulent.  We will obtain labs and since this is relatively unusual finding I will obtain a CT to verify that there is no abscess and try to determine where the fluid is coming from.  If this is ascites fluid patient will likely need surgical evaluation.  ----------------------------------------- 12:17 AM on 09/14/2017 -----------------------------------------  Lab work-up shows persistent hyponatremia.  The patient was noted to be hyponatremic when he was in the oncologist office on 09/11/2017 and was given fluids at that time but it clearly has not improved.  His other labs are consistent with baseline.  CT shows a loculated fluid collection within the umbilical hernia.  I consulted Dr. Burt Knack from general surgery who advises that this is consistent with the ascites fluid draining from the hernia.  He advises that there is no surgical intervention for this, but recommends that the patient get paracentesis as decreasing the pressure in the abdomen may cause the lesion to stop draining.  Given that it is still actively draining a significant amount and since the patient is so hyponatremic, I will admit.  There is no indication for emergent paracentesis in the ED tonight, so it can be done when the patient is in the hospital.  I signed the patient out to the hospitalist Dr.  Duane Boston. ____________________________________________   FINAL CLINICAL IMPRESSION(S) / ED DIAGNOSES  Final diagnoses:  Other ascites  Umbilical hernia without obstruction and without gangrene  Hyponatremia      NEW MEDICATIONS STARTED DURING THIS VISIT:  New Prescriptions   No medications on file     Note:  This document was prepared using Dragon voice recognition software and may include unintentional dictation errors.    Arta Silence, MD 09/14/17 [phone]

## 2017-09-13 NOTE — ED Triage Notes (Signed)
Pt states history of abd hernia and liver failure. Pt with hernia noted to mid lower abd that is large and spurting yellow clear fluid. Pt denies pain. Pt states began to leak fluid at 2000 today.

## 2017-09-14 ENCOUNTER — Other Ambulatory Visit: Payer: Self-pay

## 2017-09-14 DIAGNOSIS — D649 Anemia, unspecified: Secondary | ICD-10-CM

## 2017-09-14 DIAGNOSIS — E1122 Type 2 diabetes mellitus with diabetic chronic kidney disease: Secondary | ICD-10-CM | POA: Diagnosis present

## 2017-09-14 DIAGNOSIS — C7800 Secondary malignant neoplasm of unspecified lung: Secondary | ICD-10-CM | POA: Diagnosis not present

## 2017-09-14 DIAGNOSIS — E1140 Type 2 diabetes mellitus with diabetic neuropathy, unspecified: Secondary | ICD-10-CM | POA: Diagnosis present

## 2017-09-14 DIAGNOSIS — E871 Hypo-osmolality and hyponatremia: Secondary | ICD-10-CM | POA: Diagnosis not present

## 2017-09-14 DIAGNOSIS — C649 Malignant neoplasm of unspecified kidney, except renal pelvis: Secondary | ICD-10-CM

## 2017-09-14 DIAGNOSIS — K7031 Alcoholic cirrhosis of liver with ascites: Secondary | ICD-10-CM | POA: Diagnosis not present

## 2017-09-14 DIAGNOSIS — Z87891 Personal history of nicotine dependence: Secondary | ICD-10-CM | POA: Diagnosis not present

## 2017-09-14 DIAGNOSIS — E1165 Type 2 diabetes mellitus with hyperglycemia: Secondary | ICD-10-CM | POA: Diagnosis present

## 2017-09-14 DIAGNOSIS — J3501 Chronic tonsillitis: Secondary | ICD-10-CM | POA: Diagnosis present

## 2017-09-14 DIAGNOSIS — R69 Illness, unspecified: Secondary | ICD-10-CM | POA: Diagnosis not present

## 2017-09-14 DIAGNOSIS — R161 Splenomegaly, not elsewhere classified: Secondary | ICD-10-CM | POA: Diagnosis present

## 2017-09-14 DIAGNOSIS — N183 Chronic kidney disease, stage 3 (moderate): Secondary | ICD-10-CM | POA: Diagnosis not present

## 2017-09-14 DIAGNOSIS — K767 Hepatorenal syndrome: Secondary | ICD-10-CM | POA: Diagnosis not present

## 2017-09-14 DIAGNOSIS — N179 Acute kidney failure, unspecified: Secondary | ICD-10-CM | POA: Diagnosis not present

## 2017-09-14 DIAGNOSIS — R188 Other ascites: Secondary | ICD-10-CM | POA: Diagnosis not present

## 2017-09-14 DIAGNOSIS — R809 Proteinuria, unspecified: Secondary | ICD-10-CM | POA: Diagnosis not present

## 2017-09-14 DIAGNOSIS — Z7189 Other specified counseling: Secondary | ICD-10-CM | POA: Diagnosis not present

## 2017-09-14 DIAGNOSIS — N400 Enlarged prostate without lower urinary tract symptoms: Secondary | ICD-10-CM | POA: Diagnosis present

## 2017-09-14 DIAGNOSIS — K7210 Chronic hepatic failure without coma: Secondary | ICD-10-CM | POA: Diagnosis present

## 2017-09-14 DIAGNOSIS — C7801 Secondary malignant neoplasm of right lung: Secondary | ICD-10-CM | POA: Diagnosis not present

## 2017-09-14 DIAGNOSIS — K7011 Alcoholic hepatitis with ascites: Secondary | ICD-10-CM | POA: Diagnosis not present

## 2017-09-14 DIAGNOSIS — D696 Thrombocytopenia, unspecified: Secondary | ICD-10-CM | POA: Diagnosis present

## 2017-09-14 DIAGNOSIS — C7802 Secondary malignant neoplasm of left lung: Secondary | ICD-10-CM | POA: Diagnosis not present

## 2017-09-14 DIAGNOSIS — Z66 Do not resuscitate: Secondary | ICD-10-CM | POA: Diagnosis not present

## 2017-09-14 DIAGNOSIS — K429 Umbilical hernia without obstruction or gangrene: Secondary | ICD-10-CM

## 2017-09-14 DIAGNOSIS — R571 Hypovolemic shock: Secondary | ICD-10-CM | POA: Diagnosis not present

## 2017-09-14 DIAGNOSIS — I129 Hypertensive chronic kidney disease with stage 1 through stage 4 chronic kidney disease, or unspecified chronic kidney disease: Secondary | ICD-10-CM | POA: Diagnosis present

## 2017-09-14 DIAGNOSIS — D631 Anemia in chronic kidney disease: Secondary | ICD-10-CM | POA: Diagnosis not present

## 2017-09-14 DIAGNOSIS — Z23 Encounter for immunization: Secondary | ICD-10-CM | POA: Diagnosis not present

## 2017-09-14 DIAGNOSIS — Z515 Encounter for palliative care: Secondary | ICD-10-CM | POA: Diagnosis not present

## 2017-09-14 DIAGNOSIS — Z681 Body mass index (BMI) 19 or less, adult: Secondary | ICD-10-CM | POA: Diagnosis not present

## 2017-09-14 DIAGNOSIS — K766 Portal hypertension: Secondary | ICD-10-CM | POA: Diagnosis not present

## 2017-09-14 DIAGNOSIS — E43 Unspecified severe protein-calorie malnutrition: Secondary | ICD-10-CM | POA: Diagnosis not present

## 2017-09-14 DIAGNOSIS — K7460 Unspecified cirrhosis of liver: Secondary | ICD-10-CM | POA: Diagnosis not present

## 2017-09-14 LAB — BASIC METABOLIC PANEL
ANION GAP: 4 — AB (ref 5–15)
BUN: 16 mg/dL (ref 8–23)
CHLORIDE: 92 mmol/L — AB (ref 98–111)
CO2: 26 mmol/L (ref 22–32)
Calcium: 8 mg/dL — ABNORMAL LOW (ref 8.9–10.3)
Creatinine, Ser: 1.24 mg/dL (ref 0.61–1.24)
GFR calc Af Amer: >60 mL/min (ref >60)
Glucose, Bld: 110 mg/dL — ABNORMAL HIGH (ref 70–99)
POTASSIUM: 4.5 mmol/L (ref 3.5–5.1)
SODIUM: 122 mmol/L — AB (ref 135–145)

## 2017-09-14 LAB — CBC
HEMATOCRIT: 32.5 % — AB (ref 40.0–52.0)
HEMOGLOBIN: 10.9 g/dL — AB (ref 13.0–18.0)
MCH: 28.2 pg (ref 26.0–34.0)
MCHC: 33.7 g/dL (ref 32.0–36.0)
MCV: 83.7 fL (ref 80.0–100.0)
Platelets: 113 10*3/uL — ABNORMAL LOW (ref 150–440)
RBC: 3.88 MIL/uL — ABNORMAL LOW (ref 4.40–5.90)
RDW: 16 % — ABNORMAL HIGH (ref 11.5–14.5)
WBC: 3.5 10*3/uL — AB (ref 3.8–10.6)

## 2017-09-14 LAB — GLUCOSE, CAPILLARY
GLUCOSE-CAPILLARY: 139 mg/dL — AB (ref 70–99)
Glucose-Capillary: 139 mg/dL — ABNORMAL HIGH (ref 70–99)
Glucose-Capillary: 230 mg/dL — ABNORMAL HIGH (ref 70–99)
Glucose-Capillary: 236 mg/dL — ABNORMAL HIGH (ref 70–99)

## 2017-09-14 MED ORDER — SODIUM CHLORIDE 0.9 % IV SOLN
INTRAVENOUS | Status: DC
  Administered 2017-09-14 – 2017-09-16 (×3): via INTRAVENOUS

## 2017-09-14 MED ORDER — LORAZEPAM 2 MG PO TABS
0.0000 mg | ORAL_TABLET | Freq: Two times a day (BID) | ORAL | Status: DC
  Administered 2017-09-16: 0 mg via ORAL
  Filled 2017-09-14: qty 2

## 2017-09-14 MED ORDER — BISACODYL 5 MG PO TBEC: 5.0000 mg | DELAYED_RELEASE_TABLET | Freq: Every day | ORAL | Status: DC | PRN

## 2017-09-14 MED ORDER — ONDANSETRON HCL 4 MG/2ML IJ SOLN
4.0000 mg | Freq: Four times a day (QID) | INTRAMUSCULAR | Status: DC | PRN
  Administered 2017-09-14 – 2017-09-15 (×2): 4 mg via INTRAVENOUS
  Filled 2017-09-14 (×2): qty 2

## 2017-09-14 MED ORDER — ACETAMINOPHEN 650 MG RE SUPP: 650.0000 mg | Freq: Four times a day (QID) | RECTAL | Status: DC | PRN

## 2017-09-14 MED ORDER — ASPIRIN 81 MG PO CHEW
81.0000 mg | CHEWABLE_TABLET | Freq: Every day | ORAL | Status: DC
  Administered 2017-09-14 – 2017-09-18 (×5): 81 mg via ORAL
  Filled 2017-09-14 (×5): qty 1

## 2017-09-14 MED ORDER — FOLIC ACID 1 MG PO TABS
1.0000 mg | ORAL_TABLET | Freq: Every day | ORAL | Status: DC
  Administered 2017-09-14 – 2017-09-17 (×4): 1 mg via ORAL
  Filled 2017-09-14 (×5): qty 1

## 2017-09-14 MED ORDER — FUROSEMIDE 10 MG/ML IJ SOLN
20.0000 mg | Freq: Two times a day (BID) | INTRAMUSCULAR | Status: DC
  Administered 2017-09-14: 09:00:00 20 mg via INTRAVENOUS
  Filled 2017-09-14: qty 2

## 2017-09-14 MED ORDER — ACETAMINOPHEN 325 MG PO TABS: 650.0000 mg | ORAL_TABLET | Freq: Four times a day (QID) | ORAL | Status: DC | PRN

## 2017-09-14 MED ORDER — LORAZEPAM 1 MG PO TABS: 1.0000 mg | ORAL_TABLET | Freq: Four times a day (QID) | ORAL | Status: DC | PRN

## 2017-09-14 MED ORDER — ALLOPURINOL 100 MG PO TABS
100.0000 mg | ORAL_TABLET | Freq: Every day | ORAL | Status: DC
  Administered 2017-09-14 – 2017-09-18 (×4): 100 mg via ORAL
  Filled 2017-09-14 (×6): qty 1

## 2017-09-14 MED ORDER — ONDANSETRON HCL 4 MG PO TABS: 4.0000 mg | ORAL_TABLET | Freq: Four times a day (QID) | ORAL | Status: DC | PRN

## 2017-09-14 MED ORDER — ADULT MULTIVITAMIN W/MINERALS CH
1.0000 | ORAL_TABLET | Freq: Every day | ORAL | Status: DC
  Administered 2017-09-14 – 2017-09-17 (×4): 1 via ORAL
  Filled 2017-09-14 (×4): qty 1

## 2017-09-14 MED ORDER — ALPRAZOLAM 1 MG PO TABS
1.0000 mg | ORAL_TABLET | ORAL | Status: DC | PRN
  Administered 2017-09-14 – 2017-09-17 (×5): 1 mg via ORAL
  Filled 2017-09-14 (×5): qty 1

## 2017-09-14 MED ORDER — NEBIVOLOL HCL 10 MG PO TABS
20.0000 mg | ORAL_TABLET | Freq: Every day | ORAL | Status: DC
  Administered 2017-09-14: 20 mg via ORAL
  Filled 2017-09-14 (×2): qty 2

## 2017-09-14 MED ORDER — THIAMINE HCL 100 MG/ML IJ SOLN
100.0000 mg | Freq: Every day | INTRAMUSCULAR | Status: DC
  Filled 2017-09-14: qty 2
  Filled 2017-09-14 (×3): qty 1

## 2017-09-14 MED ORDER — DOCUSATE SODIUM 100 MG PO CAPS
100.0000 mg | ORAL_CAPSULE | Freq: Two times a day (BID) | ORAL | Status: DC
  Administered 2017-09-14 – 2017-09-17 (×8): 100 mg via ORAL
  Filled 2017-09-14 (×8): qty 1

## 2017-09-14 MED ORDER — LORAZEPAM 2 MG PO TABS: 0.0000 mg | ORAL_TABLET | Freq: Four times a day (QID) | ORAL | Status: DC

## 2017-09-14 MED ORDER — FLUTICASONE PROPIONATE 50 MCG/ACT NA SUSP
1.0000 | Freq: Every day | NASAL | Status: DC
  Administered 2017-09-14: 09:00:00 1 via NASAL
  Filled 2017-09-14: qty 16

## 2017-09-14 MED ORDER — VITAMIN B-1 100 MG PO TABS
100.0000 mg | ORAL_TABLET | Freq: Every day | ORAL | Status: DC
  Administered 2017-09-14 – 2017-09-17 (×4): 100 mg via ORAL
  Filled 2017-09-14 (×4): qty 1

## 2017-09-14 MED ORDER — HYDROCODONE-ACETAMINOPHEN 5-325 MG PO TABS
1.0000 | ORAL_TABLET | ORAL | Status: DC | PRN
  Administered 2017-09-14: 22:00:00 2 via ORAL
  Administered 2017-09-18: 08:00:00 1 via ORAL
  Filled 2017-09-14: qty 2
  Filled 2017-09-14: qty 1

## 2017-09-14 MED ORDER — SODIUM CHLORIDE 0.9% FLUSH
3.0000 mL | Freq: Two times a day (BID) | INTRAVENOUS | Status: DC
  Administered 2017-09-14 – 2017-09-17 (×7): 3 mL via INTRAVENOUS

## 2017-09-14 MED ORDER — INSULIN ASPART 100 UNIT/ML ~~LOC~~ SOLN
0.0000 [IU] | Freq: Every day | SUBCUTANEOUS | Status: DC
  Administered 2017-09-14 – 2017-09-15 (×2): 2 [IU] via SUBCUTANEOUS
  Filled 2017-09-14 (×2): qty 1

## 2017-09-14 MED ORDER — GABAPENTIN 100 MG PO CAPS
200.0000 mg | ORAL_CAPSULE | Freq: Two times a day (BID) | ORAL | Status: DC
  Administered 2017-09-14 – 2017-09-18 (×9): 200 mg via ORAL
  Filled 2017-09-14 (×9): qty 2

## 2017-09-14 MED ORDER — INFLUENZA VAC SPLIT QUAD 0.5 ML IM SUSY
0.5000 mL | PREFILLED_SYRINGE | INTRAMUSCULAR | Status: AC
  Administered 2017-09-16: 0.5 mL via INTRAMUSCULAR
  Filled 2017-09-14: qty 0.5

## 2017-09-14 MED ORDER — SPIRONOLACTONE 25 MG PO TABS
50.0000 mg | ORAL_TABLET | Freq: Every day | ORAL | Status: DC
  Administered 2017-09-14: 09:00:00 50 mg via ORAL
  Filled 2017-09-14: qty 2

## 2017-09-14 MED ORDER — FUROSEMIDE 10 MG/ML IJ SOLN: 20.0000 mg | Freq: Every day | INTRAMUSCULAR | Status: DC

## 2017-09-14 MED ORDER — INSULIN DETEMIR 100 UNIT/ML ~~LOC~~ SOLN
25.0000 [IU] | Freq: Every day | SUBCUTANEOUS | Status: DC
  Administered 2017-09-14 – 2017-09-17 (×4): 25 [IU] via SUBCUTANEOUS
  Filled 2017-09-14 (×5): qty 0.25

## 2017-09-14 MED ORDER — HEPARIN SODIUM (PORCINE) 5000 UNIT/ML IJ SOLN
5000.0000 [IU] | Freq: Three times a day (TID) | INTRAMUSCULAR | Status: DC
  Administered 2017-09-14 – 2017-09-16 (×7): 5000 [IU] via SUBCUTANEOUS
  Filled 2017-09-14 (×9): qty 1

## 2017-09-14 MED ORDER — LORAZEPAM 2 MG/ML IJ SOLN: 1.0000 mg | Freq: Four times a day (QID) | INTRAMUSCULAR | Status: DC | PRN

## 2017-09-14 MED ORDER — INSULIN ASPART 100 UNIT/ML ~~LOC~~ SOLN
0.0000 [IU] | Freq: Three times a day (TID) | SUBCUTANEOUS | Status: DC
  Administered 2017-09-14: 17:00:00 3 [IU] via SUBCUTANEOUS
  Administered 2017-09-14 (×2): 1 [IU] via SUBCUTANEOUS
  Administered 2017-09-15: 2 [IU] via SUBCUTANEOUS
  Administered 2017-09-16: 5 [IU] via SUBCUTANEOUS
  Administered 2017-09-16 (×2): 3 [IU] via SUBCUTANEOUS
  Filled 2017-09-14 (×7): qty 1

## 2017-09-14 NOTE — Progress Notes (Signed)
Family Meeting Note  Advance Directive:no  Today a meeting took place with the Patient.   The following clinical team members were present during this meeting:MD  The following were discussed:Patient's diagnosis: Stage IV renal cell carcinoma with bilateral lung metastasis Ongoing EtOH abuse Hyponatremia, Patient's progosis: Unable to determine and Goals for treatment: Full Code  Additional follow-up to be provided: Patient agreeable to palliative care and chaplain consultation to start advanced directives.  Time spent during discussion: 16 minutes  Sabastien Tyler, MD

## 2017-09-14 NOTE — Progress Notes (Signed)
Abdominal binder applied with pt and dgt educated. TC with Dr. Benjie Karvonen to clarify gabapentin order per pt request.

## 2017-09-14 NOTE — Progress Notes (Addendum)
CIWA negative. Denies pain/SOB. Active yellow serous drainage from umbilical hernia site which  reduces easily. Fecal collection bag with drainage bag intact. ABD  Binder in place. Dgt at bedside most of day. Up at bedside. Pt and dgt educated on 1200 ml fluid restriction with limited memory recall by pt; reinforced pt and dgt to limit po intake.

## 2017-09-14 NOTE — Progress Notes (Signed)
[place] received an OR request for (AD), CH presented to the room, identified myself as the [facility] and was warmly welcomed in. Pastoral presence ensued. [place]  asked if the patient had requested (AD) education, he hadn't, but was interested in finding out more about a (AD), pastoral presences ensued, (AD) education commenced. An (AD) was left for the patient to review later and speak with family regarding a healthcare proxy and or living will. Patient is divorced with two adult children. Patient will contact the [facility] service if electing to complete an (AD)    09/14/17 1400  Clinical Encounter Type  Visited With Patient  Visit Type Initial  Referral From Physician  Consult/Referral To [place] (For Healthcare)  Does Patient Have a Medical Advance Directive? No  Would patient like information on creating a medical advance directive? Yes (Inpatient - patient defers creating a medical advance directive at this time)  [place]  Does Patient Have a Mental Health Advance Directive? No  Would patient like information on creating a mental health advance directive? No - Patient declined

## 2017-09-14 NOTE — Consult Note (Signed)
Lucilla Lame, MD [facility]  [address], [place], [place] [address] Phone: [phone] Fax : [phone]  Consultation  Referring Provider:     Dr. Benjie Karvonen Primary Care Physician:  [name], [name], MD Primary Gastroenterologist:  Dr. Vicente Males         Reason for Consultation:     Ascites  Date of Admission:  09/13/2017 Date of Consultation:  09/14/2017         HPI:   Rodney Stewart is a 62 y.o. male who I am being asked to see due to ascites.  The patient reports that he has a history of alcohol abuse and is presently drinking a few beers every other day.  The patient also has a history of renal cell carcinoma with surgery performed and subsequent metastatic disease.  The patient states that he was carrying a lot of heavy things yesterday and his umbilical hernia that appears to be an incisional hernia started to leak clear yellow fluid.  The patient denies any bleeding or abdominal pain.  The patient also has seen Dr. Vicente Males in the past and was reported to have hepatitis B with coinfection with delta.  The patient has had a ostomy bag placed over the drainage with continued clear yellow fluid coming into the back.  The patient has been seen by surgery today who reports the patient to be a poor surgical candidate due to his liver disease with hyponatremia and thrombocytopenia.  It was recommended that the patient had a paracentesis and a abdominal binder.  Past Medical History:  Diagnosis Date  . Anemia   . BPH (benign prostatic hyperplasia)   . CKD (chronic kidney disease) stage 3, GFR 30-59 ml/min (HCC)   . Clear cell renal cell carcinoma ([place]) 2014   Left Nephrectomy.  . Colon polyps   . Diabetes mellitus without complication ([place])    type 2   . Dyspnea    with exertion  . GERD (gastroesophageal reflux disease)   . History of gout   . History of nephrectomy    Left  . Hypertension   . Neuropathy   . Renal Cancer    Renal Cancer  . Renal cell carcinoma of left kidney  (HCC)    mets to lungs  . Umbilical hernia     Past Surgical History:  Procedure Laterality Date  . BACK SURGERY     ruptured disc 1991   . ENDOBRONCHIAL ULTRASOUND N/A 10/14/2015   Procedure: ENDOBRONCHIAL ULTRASOUND;  Surgeon: [name], MD;  Location: [facility] ORS;  Service: Pulmonary;  Laterality: N/A;  . ESOPHAGOGASTRODUODENOSCOPY (EGD) WITH PROPOFOL N/A 04/15/2017   Procedure: ESOPHAGOGASTRODUODENOSCOPY (EGD) WITH PROPOFOL;  Surgeon: Jonathon Bellows, MD;  Location: [facility] ENDOSCOPY;  Service: Gastroenterology;  Laterality: N/A;  Screen for esophageal varices  . EYE SURGERY Bilateral    Cataract Extraction with IOL  . KNEE SURGERY Right   . NEPHRECTOMY     left kidney 2014 renal cell cancer     Prior to Admission medications   Medication Sig Start Date End Date Taking? Authorizing Provider  acetaminophen (TYLENOL) 325 MG tablet Take 325 mg by mouth every 6 (six) hours as needed.   Yes [provider]  allopurinol (ZYLOPRIM) 100 MG tablet Take 1 tablet (100 mg total) by mouth daily. 08/27/17  Yes [name], [name], MD  ALPRAZolam Duanne Moron) 1 MG tablet Take 1 mg by mouth as needed for sleep. 06/10/16  Yes [provider]  amLODipine (NORVASC) 5 MG tablet  Take 1 tablet (5 mg total) by mouth daily. 04/26/17  Yes [name], [name], MD  aspirin 81 MG chewable tablet Chew 81 mg by mouth daily.   Yes [provider]  gabapentin (NEURONTIN) 100 MG capsule Take 2 capsules (200 mg total) by mouth 3 (three) times daily. Patient taking differently: Take 200 mg by mouth 2 (two) times daily.  04/26/17  Yes [name], [name], MD  insulin aspart (NOVOLOG) 100 UNIT/ML FlexPen Before meals 131-180 2 units, 181-240 4 units, 241-300 6 units, 301-350 8 units, 351-400 10 units, >400 12 units 04/26/17  Yes [name], [name], MD  Insulin Detemir (LEVEMIR FLEXTOUCH) 100 UNIT/ML Pen Inject 40 Units into the skin daily at 10 pm. 05/28/17  Yes [name],  [name], MD  lovastatin (MEVACOR) 20 MG tablet Take 1 tablet (20 mg total) by mouth daily at 6 PM. 08/08/17  Yes [name], [name], MD  Nebivolol HCl 20 MG TABS Take 20 mg by mouth daily.   Yes [provider]  ONE TOUCH ULTRA TEST test strip Bid 05/28/17  Yes [name], [name], MD  spironolactone (ALDACTONE) 50 MG tablet Take 1 tablet (50 mg total) by mouth daily. 08/16/17  Yes Jonathon Bellows, MD  tamsulosin (FLOMAX) 0.4 MG CAPS capsule Take 0.4 mg by mouth every other day.  08/02/15  Yes [provider]  albuterol (PROVENTIL HFA;VENTOLIN HFA) 108 (90 Base) MCG/ACT inhaler Inhale 1-2 puffs into the lungs every 6 (six) hours as needed for wheezing or shortness of breath. Patient not taking: Reported on 09/14/2017 05/28/17   [name], [name], MD  colchicine 0.6 MG tablet Take 0.6 mg by mouth every other day.  06/26/15   [provider]  fluticasone Asencion Islam) 50 MCG/ACT nasal spray  03/26/17   [provider]    Family History  Problem Relation Age of Onset  . Ovarian cancer Mother   . Diabetes Father   . Hypertension Father      Social History   Tobacco Use  . Smoking status: Former Smoker    Packs/day: 1.00    Types: Cigarettes    Last attempt to quit: 04/15/1993    Years since quitting: 24.4  . Smokeless tobacco: Never Used  Substance Use Topics  . Alcohol use: Yes    Alcohol/week: 42.0 standard drinks    Types: 42 Cans of beer per week    Comment: 2-3 beers every other day  . Drug use: No    Allergies as of 09/13/2017  . (No Known Allergies)    Review of Systems:    All systems reviewed and negative except where noted in HPI.   Physical Exam:  Vital signs in last 24 hours: Temp:  [97.9 F (36.6 C)-98 F (36.7 C)] 98 F (36.7 C) (09/14 0301) Pulse Rate:  [57-71] 65 (09/14 0301) Resp:  [16-19] 19 (09/14 0301) BP: (99-133)/(72-80) 133/80 (09/14 0301) SpO2:  [99 %-100 %] 100 % (09/14 0301) Weight:  [68 kg-71.7 kg] 68 kg  (09/14 0301) Last BM Date: 09/13/17 General:   Pleasant, cooperative in NAD Head:  Normocephalic and atraumatic. Eyes:   No icterus.   Conjunctiva pink. PERRLA. Ears:  Normal auditory acuity. Neck:  Supple; no masses or thyroidomegaly Lungs: Respirations even and unlabored. Lungs clear to auscultation bilaterally.   No wheezes, crackles, or rhonchi.  Heart:  Regular rate and rhythm;  Without murmur, clicks, rubs or gallops Abdomen:  Soft, nondistended, nontender. Normal bowel sounds. No appreciable masses or hepatomegaly.  No  rebound or guarding.  Umbilical hernia/incisional hernia with drainage from an ulcerated lesion. Rectal:  Not performed. Msk:  Symmetrical without gross deformities.   Extremities:  Without edema, cyanosis or clubbing. Neurologic:  Alert and oriented x3;  grossly normal neurologically. Skin:  Intact without significant lesions or rashes. Cervical Nodes:  No significant cervical adenopathy. Psych:  Alert and cooperative. Normal affect.  LAB RESULTS: Recent Labs    09/13/17 2144 09/14/17 0443  WBC 3.3* 3.5*  HGB 12.0* 10.9*  HCT 35.9* 32.5*  PLT 137* 113*   BMET Recent Labs    09/13/17 2144 09/14/17 0443  NA 117* 122*  K 5.2* 4.5  CL 85* 92*  CO2 25 26  GLUCOSE 243* 110*  BUN 19 16  CREATININE 1.35* 1.24  CALCIUM 8.2* 8.0*   LFT Recent Labs    09/13/17 2144  PROT 6.6  ALBUMIN 3.0*  AST 52*  ALT 27  ALKPHOS 444*  BILITOT 1.7*  BILIDIR 0.7*  IBILI 1.0*   PT/INR Recent Labs    09/13/17 2144  LABPROT 14.6  INR 1.15    STUDIES: Ct Abdomen Pelvis W Contrast  Result Date: 09/13/2017 CLINICAL DATA:  Abdominal hernia with liver failure EXAM: CT ABDOMEN AND PELVIS WITH CONTRAST TECHNIQUE: Multidetector CT imaging of the abdomen and pelvis was performed using the standard protocol following bolus administration of intravenous contrast. CONTRAST:  121mL ISOVUE-300 IOPAMIDOL (ISOVUE-300) INJECTION 61% COMPARISON:  CT 08/30/2017, 06/07/2017, PET  CT 10/15/2016 FINDINGS: Lower chest: Lung bases again demonstrate multiple pulmonary nodules. Left lower lobe lung mass measures 2.6 x 2.8 cm, compared with 2.4 x 2.8 cm previously. Multiple additional small lung nodules in the lung bases are again noted. No pleural effusion. The heart size is normal. Hepatobiliary: Cirrhotic liver. Heterogenous hypodensity at the junction of the right and left lobes without well defined mass. Appearance is change from 08/30/2017. Calcified gallstone. No biliary dilatation. Pancreas: Unremarkable. No pancreatic ductal dilatation or surrounding inflammatory changes. Spleen: Enlarged, measuring 16 cm. Adrenals/Urinary Tract: Right adrenal gland and kidney are normal. Status post removal of left adrenal gland and left kidney. Bladder is slightly thick walled. Stomach/Bowel: The stomach is nonenlarged. No dilated small bowel. No colon wall thickening. Negative appendix. Vascular/Lymphatic: Nonaneurysmal aorta. Moderate aortic atherosclerosis. No significant adenopathy. Multiple upper abdominal varices. Reproductive: Slightly enlarged prostate Other: Moderate to large volume of ascites in the abdomen. Ventral hernia containing slightly loculated fluid measuring 9.5 x 2.1 cm, appears decreased compared to most recent CT. Prominent vessels are noted in the hernia. Large left inguinal fluid collection in addition to large left and small right hydrocele. Musculoskeletal: Degenerative changes. No acute or suspicious abnormality. IMPRESSION: 1. Periumbilical ventral hernia containing slightly loculated fluid collection measuring approximately 9.5 x 2.7 cm, slight lead decreased as compared with the most recent CT. Prominent enhancing vessels are noted in the hernia. 2. Cirrhosis of the liver with portal hypertension, splenomegaly and varices. Moderate to large volume of ascites within the abdomen and pelvis. 3. New heterogenous hypoenhancement within the liver, possible mass versus altered  perfusion dynamics. 4. Essentially stable pulmonary nodules within the lower lobes. 5. Status post left nephrectomy and removal of left adrenal gland 6. Gallstones 7. Large left inguinal fluid collection and large left hydrocele with small right hydrocele Electronically Signed   By: Donavan Foil M.D.   On: 09/13/2017 23:20      Impression / Plan:   Assessment: Active Problems:   Acute hyponatremia   Other ascites   Rodney Pollock  Stewart is a 62 y.o. y/o male with hepatitis B and delta with a history of alcohol abuse and cirrhosis who now comes in with ascites and drainage from an umbilical hernia.  The patient has been deemed not a surgical candidate by surgery.  Plan: Plan for this patient would be to minimize the amount of ascitic fluid that can leak out of his hernia mucosal defect.  This could be done by increasing the patient's diuretic and since his creatinine and BUN are normal there is some room to adjust.  The patient may also have a paracentesis although this is only temporary and the fluid would rapidly return.  The patient has been explained the plan and agrees with it.  Thank you for involving me in the care of this patient.      LOS: 0 days   Lucilla Lame, MD  09/14/2017, 11:00 AM    Note: This dictation was prepared with Dragon dictation along with smaller phrase technology. Any transcriptional errors that result from this process are unintentional.

## 2017-09-14 NOTE — Progress Notes (Signed)
Patient briefly seen and examined.  Case discussed with Dr. Burt Knack.  Continue plan as outlined by admitting MD  Possible paracentesis for ascites.

## 2017-09-14 NOTE — Progress Notes (Signed)
[facility]  ROUNDING NOTE   Subjective:  Patient well-known to [place] as we follow him for chronic kidney disease in the office. He presents now with drainage from an umbilical hernia. He also has had limited p.o. intake other than alcohol. Serum sodium was found to be quite low at 117 with a creatinine of 1.35. Daughter is also at the bedside.   Objective:  Vital signs in last 24 hours:  Temp:  [97.7 F (36.5 C)-98 F (36.7 C)] 97.7 F (36.5 C) (09/14 0855) Pulse Rate:  [57-71] 65 (09/14 0301) Resp:  [16-20] 20 (09/14 0855) BP: (99-140)/(72-80) 140/78 (09/14 0855) SpO2:  [95 %-100 %] 95 % (09/14 0855) Weight:  [68 kg-71.7 kg] 68 kg (09/14 0301)  Weight change:  Filed Weights   09/13/17 04-10-2127 09/14/17 0301  Weight: 71.7 kg 68 kg    Intake/Output: I/O last 3 completed shifts: In: 1000 [IV Piggyback:1000] Out: 400 [Other:400]   Intake/Output this shift:  Total I/O In: 606 [P.O.:606] Out: 950 [Urine:950]  Physical Exam: General: No acute distress  Head: Normocephalic, atraumatic. Moist oral mucosal membranes  Eyes: Anicteric  Neck: Supple, trachea midline  Lungs:  Clear to auscultation, normal effort  Heart: S1S2 no rubs  Abdomen:  Soft, nontender, bowel sounds present, umbilical hernia noted  Extremities: no peripheral edema.  Neurologic: Awake, alert, following commands  Skin: No lesions       Basic Metabolic Panel: Recent Labs  Lab 09/11/17 1031 09/13/17 Apr 10, 2142 09/14/17 0443  NA 119* 117* 122*  K 5.2* 5.2* 4.5  CL 85* 85* 92*  CO2 25 25 26   GLUCOSE 69* 243* 110*  BUN 19 19 16   CREATININE 1.20 1.35* 1.24  CALCIUM 9.0 8.2* 8.0*    Liver Function Tests: Recent Labs  Lab 09/11/17 1031 09/13/17 2144  AST 51* 52*  ALT 23 27  ALKPHOS 417* 444*  BILITOT 1.9* 1.7*  PROT 7.2 6.6  ALBUMIN 3.3* 3.0*   No results for input(s): LIPASE, AMYLASE in the last 168 hours. No results for input(s): AMMONIA in the last 168 hours.  CBC: Recent Labs   Lab 09/11/17 1031 09/13/17 2142-04-10 09/14/17 0443  WBC 3.9 3.3* 3.5*  NEUTROABS 2.4 2.2  --   HGB 11.9* 12.0* 10.9*  HCT 35.2* 35.9* 32.5*  MCV 82.0 83.9 83.7  PLT 159 137* 113*    Cardiac Enzymes: No results for input(s): CKTOTAL, CKMB, CKMBINDEX, TROPONINI in the last 168 hours.  BNP: Invalid input(s): POCBNP  CBG: Recent Labs  Lab 09/14/17 0750 09/14/17 1132  GLUCAP 139* 139*    Microbiology: Results for orders placed or performed during the hospital encounter of 03/01/17  Body fluid culture     Status: None   Collection Time: 03/01/17  8:44 AM  Result Value Ref Range Status   Specimen Description   Final    PERITONEAL Performed at [facility], [address] [place], [place] [address]    Special Requests   Final    NONE Performed at [facility], [address] [place], [place] [address]    Gram Stain   Final    RARE WBC PRESENT,BOTH PMN AND MONONUCLEAR NO ORGANISMS SEEN    Culture   Final    NO GROWTH 3 DAYS Performed at [facility], [place] [address] [place], [place] [address]    Report Status 03/04/2017 FINAL  Final    Coagulation Studies: Recent Labs    09/13/17 10-Apr-2142  LABPROT 14.6  INR 1.15  Urinalysis: No results for input(s): COLORURINE, LABSPEC, PHURINE, GLUCOSEU, HGBUR, BILIRUBINUR, KETONESUR, PROTEINUR, UROBILINOGEN, NITRITE, LEUKOCYTESUR in the last 72 hours.  Invalid input(s): APPERANCEUR    Imaging: Ct Abdomen Pelvis W Contrast  Result Date: 09/13/2017 CLINICAL DATA:  Abdominal hernia with liver failure EXAM: CT ABDOMEN AND PELVIS WITH CONTRAST TECHNIQUE: Multidetector CT imaging of the abdomen and pelvis was performed using the standard protocol following bolus administration of intravenous contrast. CONTRAST:  17mL ISOVUE-300 IOPAMIDOL (ISOVUE-300) INJECTION 61% COMPARISON:  CT 08/30/2017, 06/07/2017, PET CT 10/15/2016 FINDINGS: Lower chest: Lung bases again demonstrate multiple pulmonary nodules. Left  lower lobe lung mass measures 2.6 x 2.8 cm, compared with 2.4 x 2.8 cm previously. Multiple additional small lung nodules in the lung bases are again noted. No pleural effusion. The heart size is normal. Hepatobiliary: Cirrhotic liver. Heterogenous hypodensity at the junction of the right and left lobes without well defined mass. Appearance is change from 08/30/2017. Calcified gallstone. No biliary dilatation. Pancreas: Unremarkable. No pancreatic ductal dilatation or surrounding inflammatory changes. Spleen: Enlarged, measuring 16 cm. Adrenals/Urinary Tract: Right adrenal gland and kidney are normal. Status post removal of left adrenal gland and left kidney. Bladder is slightly thick walled. Stomach/Bowel: The stomach is nonenlarged. No dilated small bowel. No colon wall thickening. Negative appendix. Vascular/Lymphatic: Nonaneurysmal aorta. Moderate aortic atherosclerosis. No significant adenopathy. Multiple upper abdominal varices. Reproductive: Slightly enlarged prostate Other: Moderate to large volume of ascites in the abdomen. Ventral hernia containing slightly loculated fluid measuring 9.5 x 2.1 cm, appears decreased compared to most recent CT. Prominent vessels are noted in the hernia. Large left inguinal fluid collection in addition to large left and small right hydrocele. Musculoskeletal: Degenerative changes. No acute or suspicious abnormality. IMPRESSION: 1. Periumbilical ventral hernia containing slightly loculated fluid collection measuring approximately 9.5 x 2.7 cm, slight lead decreased as compared with the most recent CT. Prominent enhancing vessels are noted in the hernia. 2. Cirrhosis of the liver with portal hypertension, splenomegaly and varices. Moderate to large volume of ascites within the abdomen and pelvis. 3. New heterogenous hypoenhancement within the liver, possible mass versus altered perfusion dynamics. 4. Essentially stable pulmonary nodules within the lower lobes. 5. Status post  left nephrectomy and removal of left adrenal gland 6. Gallstones 7. Large left inguinal fluid collection and large left hydrocele with small right hydrocele Electronically Signed   By: Donavan Foil M.D.   On: 09/13/2017 23:20     Medications:    . allopurinol  100 mg Oral Daily  . aspirin  81 mg Oral Daily  . docusate sodium  100 mg Oral BID  . fluticasone  1 spray Each Nare Daily  . folic acid  1 mg Oral Daily  . furosemide  20 mg Intravenous BID  . gabapentin  200 mg Oral BID  . heparin  5,000 Units Subcutaneous Q8H  . [START ON 09/15/2017] Influenza vac split quadrivalent PF  0.5 mL Intramuscular Tomorrow-1000  . insulin aspart  0-5 Units Subcutaneous QHS  . insulin aspart  0-9 Units Subcutaneous TID WC  . insulin detemir  25 Units Subcutaneous Q2200  . LORazepam  0-4 mg Oral Q6H   Followed by  . [START ON 09/16/2017] LORazepam  0-4 mg Oral Q12H  . multivitamin with minerals  1 tablet Oral Daily  . nebivolol  20 mg Oral Daily  . sodium chloride flush  3 mL Intravenous Q12H  . spironolactone  50 mg Oral Daily  . thiamine  100 mg Oral Daily  Or  . thiamine  100 mg Intravenous Daily   acetaminophen **OR** acetaminophen, ALPRAZolam, bisacodyl, HYDROcodone-acetaminophen, LORazepam **OR** LORazepam, ondansetron **OR** ondansetron (ZOFRAN) IV  Assessment/ Plan:  62 y.o. male with past medical history of BPH, chronic kidney disease stage III baseline creatinine 1.5, metastatic renal cell carcinoma status post left nephrectomy 05/2012, diabetes mellitus type 2, hypertension, admitted now with hyponatremia. Pt drinking significant amounts of alcohol at home.   1.  Hyponatremia.  Serum sodium found to be quite low at 117 yesterday.  It is up to 122 today.  Patient has had poor solute intake at home.  He is also been drinking a significant amount of alcohol.  He will start him on 0.9 normal saline at 40 cc/h.  Continue to monitor serum sodium closely.  2.  Chronic kidney disease stage  III/proteinuria.  The patient's creatinine has been fluctuating recently.  Yesterday creatinine was as high as 1.35.  Currently creatinine 1.24.  Continue to monitor.  3.  Anemia of chronic kidney disease.  Hemoglobin currently 10.9.  He is being monitored by hematology.  No indication for Procrit at the moment.  4.  Thanks for consultation.     LOS: 0 Martia Dalby 9/14/20192:20 PM

## 2017-09-14 NOTE — Consult Note (Signed)
Surgical Consultation  09/14/2017  Rodney Stewart is an 62 y.o. male.   Referring Physician: Mody  CC: Abdominal drainage  HPI: This patient with long-standing liver disease secondary to alcoholism.  He has had a long-standing hernia as well.  He was told that it could not be fixed in the past.  Yesterday he started draining from it a clear fluid.  I was called from the emergency room last night and spoke to the ER physician.  Today Dr. Genia Harold asked for a consult concerning management of this drainage.  An ostomy bag is been placed.  Denies fevers or chills or abdominal pain and no nausea or vomiting.  Past Medical History:  Diagnosis Date  . Anemia   . BPH (benign prostatic hyperplasia)   . CKD (chronic kidney disease) stage 3, GFR 30-59 ml/min (HCC)   . Clear cell renal cell carcinoma ([place]) 2014   Left Nephrectomy.  . Colon polyps   . Diabetes mellitus without complication ([place])    type 2   . Dyspnea    with exertion  . GERD (gastroesophageal reflux disease)   . History of gout   . History of nephrectomy    Left  . Hypertension   . Neuropathy   . Renal Cancer    Renal Cancer  . Renal cell carcinoma of left kidney (HCC)    mets to lungs  . Umbilical hernia     Past Surgical History:  Procedure Laterality Date  . BACK SURGERY     ruptured disc 1991   . ENDOBRONCHIAL ULTRASOUND N/A 10/14/2015   Procedure: ENDOBRONCHIAL ULTRASOUND;  Surgeon: [name], MD;  Location: [facility] ORS;  Service: Pulmonary;  Laterality: N/A;  . ESOPHAGOGASTRODUODENOSCOPY (EGD) WITH PROPOFOL N/A 04/15/2017   Procedure: ESOPHAGOGASTRODUODENOSCOPY (EGD) WITH PROPOFOL;  Surgeon: Jonathon Bellows, MD;  Location: [facility] ENDOSCOPY;  Service: Gastroenterology;  Laterality: N/A;  Screen for esophageal varices  . EYE SURGERY Bilateral    Cataract Extraction with IOL  . KNEE SURGERY Right   . NEPHRECTOMY     left kidney 2014 renal cell cancer     Family History  Problem Relation Age of Onset  . Ovarian  cancer Mother   . Diabetes Father   . Hypertension Father     Social History:  reports that he quit smoking about 24 years ago. His smoking use included cigarettes. He smoked 1.00 pack per day. He has never used smokeless tobacco. He reports that he drinks about 42.0 standard drinks of alcohol per week. He reports that he does not use drugs.   Patient is retired.  He told me he only drinks a couple of beers every other day.  Allergies: No Known Allergies  Medications reviewed.   Review of Systems:   Review of Systems  Constitutional: Negative.   HENT: Negative.   Eyes: Negative.   Respiratory: Negative.   Cardiovascular: Negative.   Gastrointestinal: Negative.   Genitourinary: Negative.   Musculoskeletal: Negative.   Skin: Negative.   Neurological: Negative.   Endo/Heme/Allergies: Negative.   Psychiatric/Behavioral: Negative.      Physical Exam:  BP 133/80 (BP Location: Right Arm)   Pulse 65   Temp 98 F (36.7 C) (Oral)   Resp 19   Ht 5' 11"  (1.803 m)   Wt 68 kg   SpO2 100%   BMI 20.91 kg/m   Physical Exam  Constitutional: He is oriented to person, place, and time. No distress.  Thin male patient with dusky ruddy complexion no acute  distress  HENT:  Head: Normocephalic and atraumatic.  Eyes: Pupils are equal, round, and reactive to light. EOM are normal. Right eye exhibits no discharge. Left eye exhibits no discharge. No scleral icterus.  Cardiovascular: Normal rate and regular rhythm.  Pulmonary/Chest: Effort normal. No respiratory distress.  Abdominal: Soft. He exhibits distension. He exhibits no mass. There is no tenderness. There is no guarding.  Umbilical hernia with ostomy bag over scabs draining clear serous ascitic fluid nontender no erythema  Musculoskeletal: Normal range of motion. He exhibits no edema.  Neurological: He is alert and oriented to person, place, and time.  Skin: He is not diaphoretic.  Vitals reviewed.     Results for orders  placed or performed during the hospital encounter of 09/13/17 (from the past 48 hour(s))  Basic metabolic panel     Status: Abnormal   Collection Time: 09/13/17  9:44 PM  Result Value Ref Range   Sodium 117 (LL) 135 - 145 mmol/L    Comment: CRITICAL RESULT CALLED TO, READ BACK BY AND VERIFIED WITH JOHN HOFFMASTER 09/13/17 2224 JML    Potassium 5.2 (H) 3.5 - 5.1 mmol/L   Chloride 85 (L) 98 - 111 mmol/L   CO2 25 22 - 32 mmol/L   Glucose, Bld 243 (H) 70 - 99 mg/dL   BUN 19 8 - 23 mg/dL   Creatinine, Ser 1.35 (H) 0.61 - 1.24 mg/dL   Calcium 8.2 (L) 8.9 - 10.3 mg/dL   GFR calc non Af Amer 55 (L) >60 mL/min   GFR calc Af Amer >60 >60 mL/min    Comment: (NOTE) The eGFR has been calculated using the CKD EPI equation. This calculation has not been validated in all clinical situations. eGFR's persistently <60 mL/min signify possible Chronic Kidney Disease.    Anion gap 7 5 - 15    Comment: Performed at [facility], [address] [place], [place] [address]  Hepatic function panel     Status: Abnormal   Collection Time: 09/13/17  9:44 PM  Result Value Ref Range   Total Protein 6.6 6.5 - 8.1 g/dL   Albumin 3.0 (L) 3.5 - 5.0 g/dL   AST 52 (H) 15 - 41 U/L   ALT 27 0 - 44 U/L   Alkaline Phosphatase 444 (H) 38 - 126 U/L   Total Bilirubin 1.7 (H) 0.3 - 1.2 mg/dL   Bilirubin, Direct 0.7 (H) 0.0 - 0.2 mg/dL   Indirect Bilirubin 1.0 (H) 0.3 - 0.9 mg/dL    Comment: Performed at [facility], [address] [place], [place] [address]  CBC with Differential     Status: Abnormal   Collection Time: 09/13/17  9:44 PM  Result Value Ref Range   WBC 3.3 (L) 3.8 - 10.6 K/uL   RBC 4.28 (L) 4.40 - 5.90 MIL/uL   Hemoglobin 12.0 (L) 13.0 - 18.0 g/dL   HCT 35.9 (L) 40.0 - 52.0 %   MCV 83.9 80.0 - 100.0 fL   MCH 28.0 26.0 - 34.0 pg   MCHC 33.4 32.0 - 36.0 g/dL   RDW 15.6 (H) 11.5 - 14.5 %   Platelets 137 (L) 150 - 440 K/uL   Neutrophils Relative % 65 %   Neutro Abs 2.2 1.4 - 6.5  K/uL   Lymphocytes Relative 17 %   Lymphs Abs 0.5 (L) 1.0 - 3.6 K/uL   Monocytes Relative 11 %   Monocytes Absolute 0.4 0.2 - 1.0 K/uL   Eosinophils Relative 6 %  Eosinophils Absolute 0.2 0 - 0.7 K/uL   Basophils Relative 1 %   Basophils Absolute 0.0 0 - 0.1 K/uL    Comment: Performed at [facility], [address] [place], [place] [address]  Protime-INR     Status: None   Collection Time: 09/13/17  9:44 PM  Result Value Ref Range   Prothrombin Time 14.6 11.4 - 15.2 seconds   INR 1.15     Comment: Performed at [facility], [address] [place], [place] [address]  Basic metabolic panel     Status: Abnormal   Collection Time: 09/14/17  4:43 AM  Result Value Ref Range   Sodium 122 (L) 135 - 145 mmol/L   Potassium 4.5 3.5 - 5.1 mmol/L   Chloride 92 (L) 98 - 111 mmol/L   CO2 26 22 - 32 mmol/L   Glucose, Bld 110 (H) 70 - 99 mg/dL   BUN 16 8 - 23 mg/dL   Creatinine, Ser 1.24 0.61 - 1.24 mg/dL   Calcium 8.0 (L) 8.9 - 10.3 mg/dL   GFR calc non Af Amer >60 >60 mL/min   GFR calc Af Amer >60 >60 mL/min    Comment: (NOTE) The eGFR has been calculated using the CKD EPI equation. This calculation has not been validated in all clinical situations. eGFR's persistently <60 mL/min signify possible Chronic Kidney Disease.    Anion gap 4 (L) 5 - 15    Comment: Performed at [facility], [address] [place], [place] [address]  CBC     Status: Abnormal   Collection Time: 09/14/17  4:43 AM  Result Value Ref Range   WBC 3.5 (L) 3.8 - 10.6 K/uL   RBC 3.88 (L) 4.40 - 5.90 MIL/uL   Hemoglobin 10.9 (L) 13.0 - 18.0 g/dL   HCT 32.5 (L) 40.0 - 52.0 %   MCV 83.7 80.0 - 100.0 fL   MCH 28.2 26.0 - 34.0 pg   MCHC 33.7 32.0 - 36.0 g/dL   RDW 16.0 (H) 11.5 - 14.5 %   Platelets 113 (L) 150 - 440 K/uL    Comment: Performed at [facility], [address] [place], [place] [address]  Glucose, capillary     Status: Abnormal   Collection Time: 09/14/17  7:50  AM  Result Value Ref Range   Glucose-Capillary 139 (H) 70 - 99 mg/dL   Ct Abdomen Pelvis W Contrast  Result Date: 09/13/2017 CLINICAL DATA:  Abdominal hernia with liver failure EXAM: CT ABDOMEN AND PELVIS WITH CONTRAST TECHNIQUE: Multidetector CT imaging of the abdomen and pelvis was performed using the standard protocol following bolus administration of intravenous contrast. CONTRAST:  129m ISOVUE-300 IOPAMIDOL (ISOVUE-300) INJECTION 61% COMPARISON:  CT 08/30/2017, 06/07/2017, PET CT 10/15/2016 FINDINGS: Lower chest: Lung bases again demonstrate multiple pulmonary nodules. Left lower lobe lung mass measures 2.6 x 2.8 cm, compared with 2.4 x 2.8 cm previously. Multiple additional small lung nodules in the lung bases are again noted. No pleural effusion. The heart size is normal. Hepatobiliary: Cirrhotic liver. Heterogenous hypodensity at the junction of the right and left lobes without well defined mass. Appearance is change from 08/30/2017. Calcified gallstone. No biliary dilatation. Pancreas: Unremarkable. No pancreatic ductal dilatation or surrounding inflammatory changes. Spleen: Enlarged, measuring 16 cm. Adrenals/Urinary Tract: Right adrenal gland and kidney are normal. Status post removal of left adrenal gland and left kidney. Bladder is slightly thick walled. Stomach/Bowel: The stomach is nonenlarged. No dilated small bowel. No colon wall thickening. Negative appendix.  Vascular/Lymphatic: Nonaneurysmal aorta. Moderate aortic atherosclerosis. No significant adenopathy. Multiple upper abdominal varices. Reproductive: Slightly enlarged prostate Other: Moderate to large volume of ascites in the abdomen. Ventral hernia containing slightly loculated fluid measuring 9.5 x 2.1 cm, appears decreased compared to most recent CT. Prominent vessels are noted in the hernia. Large left inguinal fluid collection in addition to large left and small right hydrocele. Musculoskeletal: Degenerative changes. No acute or  suspicious abnormality. IMPRESSION: 1. Periumbilical ventral hernia containing slightly loculated fluid collection measuring approximately 9.5 x 2.7 cm, slight lead decreased as compared with the most recent CT. Prominent enhancing vessels are noted in the hernia. 2. Cirrhosis of the liver with portal hypertension, splenomegaly and varices. Moderate to large volume of ascites within the abdomen and pelvis. 3. New heterogenous hypoenhancement within the liver, possible mass versus altered perfusion dynamics. 4. Essentially stable pulmonary nodules within the lower lobes. 5. Status post left nephrectomy and removal of left adrenal gland 6. Gallstones 7. Large left inguinal fluid collection and large left hydrocele with small right hydrocele Electronically Signed   By: Donavan Foil M.D.   On: 09/13/2017 23:20    Assessment/Plan:  Labs reviewed CT reviewed This patient with an ascites leak from a long-standing umbilical hernia secondary to long-standing alcoholism.  He is hyponatremic thrombocytopenic as well.  This makes him a horrible surgical candidate.  I would recommend an abdominal binder which I have ordered.  I discussed with Dr. Donna Christen the potential for paracentesis to decrease pressure and this may close on its own but it is unlikely.  There are no surgical plans in this patient at this time.  Florene Glen, MD, FACS

## 2017-09-14 NOTE — Consult Note (Signed)
Hematology/Oncology Consult note [facility] Telephone:([phone] [phone]  Patient Care Team: [name], [name], MD as PCP - General (Internal Medicine)   Name of the patient: Rodney Stewart  [id number]  20-Jul-1955    Reason for consult: h/o RCC with lung mets   Requesting physician: Dr. Benjie Karvonen  Date of visit: 09/14/2017    History of presenting illness- Patient is a 62 yr old male with h/o metastatic RCC with lung mets. He sees Dr. Grayland Ormond as an outpatient. He is on palliative opdivo and last received treatment on 09/11/17. He also has a h/o alcohol related cirrhosis and sees Dr. Vicente Males as an outpatient. He last underwent paracentesis 2 weeks ago and prior to that in March 2019 per patient. He presented to the ER due to drainage from the abdominal wound that he has had on his abdominal wall in the region of his hernia. Currently he has a bag in place around the site and he is draining clear fluid. Surgery and GI also on board. Dr. Allen Norris recommended adjusting his diuretics to manage his ascites  Currently patient feels fatigued. Denies any fever, pain or sob  ECOG PS- 2  Pain scale- 0   Review of systems- Review of Systems  Constitutional: Positive for malaise/fatigue. Negative for chills, fever and weight loss.  HENT: Negative for congestion, ear discharge and nosebleeds.   Eyes: Negative for blurred vision.  Respiratory: Negative for cough, hemoptysis, sputum production, shortness of breath and wheezing.   Cardiovascular: Negative for chest pain, palpitations, orthopnea and claudication.  Gastrointestinal: Negative for abdominal pain, blood in stool, constipation, diarrhea, heartburn, melena, nausea and vomiting.       Abdominal distension  Genitourinary: Negative for dysuria, flank pain, frequency, hematuria and urgency.  Musculoskeletal: Negative for back pain, joint pain and myalgias.  Skin: Negative for rash.  Neurological: Negative for  dizziness, tingling, focal weakness, seizures, weakness and headaches.  Endo/Heme/Allergies: Does not bruise/bleed easily.  Psychiatric/Behavioral: Negative for depression and suicidal ideas. The patient does not have insomnia.     No Known Allergies  Patient Active Problem List   Diagnosis Date Noted  . Acute hyponatremia 09/14/2017  . Other ascites   . Hyponatremia 05/28/2017  . Colon polyps 04/26/2017  . Umbilical hernia 01/03/7251  . Left inguinal hernia 04/26/2017  . Gout 04/26/2017  . Fatty liver 04/26/2017  . Leukopenia 04/26/2017  . Gallstones 04/26/2017  . Type 2 diabetes mellitus with diabetic neuropathy, unspecified ([place]) 04/26/2017  . CAD (coronary artery disease) 04/26/2017  . Chronic knee pain 04/26/2017  . Protein-calorie malnutrition ([place]) 04/26/2017  . Cirrhosis ([place]) 02/17/2017  . Neutropenia associated with infection ([place])   . Malignant neoplasm of kidney ([place])   . Thrombocytopenia ([place])   . Iron deficiency anemia 01/23/2017  . Goals of care, counseling/discussion 11/27/2016  . BPH (benign prostatic hyperplasia) 10/27/2016  . Effusion of left olecranon bursa 10/27/2016  . Foreign body in left ear 10/27/2016  . Pancytopenia ([place]) 10/27/2016  . Alcohol abuse 08/28/2016  . Nonimmune to hepatitis B virus 08/02/2016  . Metastatic renal cell carcinoma to lung ([place])   . ARF (acute renal failure) ([place]) 09/30/2015  . Type 2 diabetes mellitus with moderate nonproliferative diabetic retinopathy and without macular edema ([place]) 09/17/2014  . Cataract 08/20/2014  . Hypertension 07/02/2014  . CKD (chronic kidney disease) stage 3, GFR 30-59 ml/min (HCC) 11/30/2013  . Single kidney 02/04/2013  . Renal cell carcinoma ([place]) 01/02/2012     Past Medical History:  Diagnosis Date  . Anemia   . BPH (benign prostatic hyperplasia)   . CKD (chronic kidney disease) stage 3, GFR 30-59 ml/min (HCC)   . Clear cell renal cell carcinoma ([place]) 2014   Left Nephrectomy.  . Colon  polyps   . Diabetes mellitus without complication ([place])    type 2   . Dyspnea    with exertion  . GERD (gastroesophageal reflux disease)   . History of gout   . History of nephrectomy    Left  . Hypertension   . Neuropathy   . Renal Cancer    Renal Cancer  . Renal cell carcinoma of left kidney (HCC)    mets to lungs  . Umbilical hernia      Past Surgical History:  Procedure Laterality Date  . BACK SURGERY     ruptured disc 1991   . ENDOBRONCHIAL ULTRASOUND N/A 10/14/2015   Procedure: ENDOBRONCHIAL ULTRASOUND;  Surgeon: [name], MD;  Location: [facility] ORS;  Service: Pulmonary;  Laterality: N/A;  . ESOPHAGOGASTRODUODENOSCOPY (EGD) WITH PROPOFOL N/A 04/15/2017   Procedure: ESOPHAGOGASTRODUODENOSCOPY (EGD) WITH PROPOFOL;  Surgeon: Jonathon Bellows, MD;  Location: [facility] ENDOSCOPY;  Service: Gastroenterology;  Laterality: N/A;  Screen for esophageal varices  . EYE SURGERY Bilateral    Cataract Extraction with IOL  . KNEE SURGERY Right   . NEPHRECTOMY     left kidney 2014 renal cell cancer     Social History   Socioeconomic History  . Marital status: Divorced    Spouse name: Not on file  . Number of children: Not on file  . Years of education: Not on file  . Highest education level: Not on file  Occupational History  . Not on file  Social Needs  . Financial resource strain: Not hard at all  . Food insecurity:    Worry: Never true    Inability: Never true  . Transportation needs:    Medical: No    Non-medical: No  Tobacco Use  . Smoking status: Former Smoker    Packs/day: 1.00    Types: Cigarettes    Last attempt to quit: 04/15/1993    Years since quitting: 24.4  . Smokeless tobacco: Never Used  Substance and Sexual Activity  . Alcohol use: Yes    Alcohol/week: 42.0 standard drinks    Types: 42 Cans of beer per week    Comment: 2-3 beers every other day  . Drug use: No  . Sexual activity: Yes  Lifestyle  . Physical activity:    Days per week: Patient  refused    Minutes per session: Patient refused  . Stress: Patient refused  Relationships  . Social connections:    Talks on phone: Patient refused    Gets together: Patient refused    Attends religious service: Patient refused    Active member of club or organization: Patient refused    Attends meetings of clubs or organizations: Patient refused    Relationship status: Patient refused  . Intimate partner violence:    Fear of current or ex partner: Patient refused    Emotionally abused: Patient refused    Physically abused: Patient refused    Forced sexual activity: Patient refused  Other Topics Concern  . Not on file  Social History Narrative   HS graduate    Retired    Divorced    2 daughters    Lives alone    [name] to travel to [employer]    1 living brother who is doctor  in [place]    Has guns, wears seat belt      Family History  Problem Relation Age of Onset  . Ovarian cancer Mother   . Diabetes Father   . Hypertension Father      Current Facility-Administered Medications:  .  acetaminophen (TYLENOL) tablet 650 mg, 650 mg, Oral, Q6H PRN **OR** acetaminophen (TYLENOL) suppository 650 mg, 650 mg, Rectal, Q6H PRN, Amelia Jo, MD .  allopurinol (ZYLOPRIM) tablet 100 mg, 100 mg, Oral, Daily, Amelia Jo, MD, 100 mg at 09/14/17 0853 .  ALPRAZolam Duanne Moron) tablet 1 mg, 1 mg, Oral, PRN, Amelia Jo, MD, 1 mg at 09/14/17 0331 .  aspirin chewable tablet 81 mg, 81 mg, Oral, Daily, Amelia Jo, MD, 81 mg at 09/14/17 0852 .  bisacodyl (DULCOLAX) EC tablet 5 mg, 5 mg, Oral, Daily PRN, Amelia Jo, MD .  docusate sodium (COLACE) capsule 100 mg, 100 mg, Oral, BID, Amelia Jo, MD, 100 mg at 09/14/17 0851 .  fluticasone (FLONASE) 50 MCG/ACT nasal spray 1 spray, 1 spray, Each Nare, Daily, Amelia Jo, MD, 1 spray at 09/14/17 0858 .  folic acid (FOLVITE) tablet 1 mg, 1 mg, Oral, Daily, Mody, Sital, MD, 1 mg at 09/14/17 0851 .  furosemide (LASIX) injection 20 mg, 20 mg,  Intravenous, BID, Amelia Jo, MD, 20 mg at 09/14/17 0854 .  gabapentin (NEURONTIN) capsule 200 mg, 200 mg, Oral, BID, Mody, Sital, MD, 200 mg at 09/14/17 1237 .  heparin injection 5,000 Units, 5,000 Units, Subcutaneous, Q8H, Amelia Jo, MD, 5,000 Units at 09/14/17 0853 .  HYDROcodone-acetaminophen (NORCO/VICODIN) 5-325 MG per tablet 1-2 tablet, 1-2 tablet, Oral, Q4H PRN, Amelia Jo, MD .  Derrill Memo ON 09/15/2017] Influenza vac split quadrivalent PF (FLUARIX) injection 0.5 mL, 0.5 mL, Intramuscular, Tomorrow-1000, Amelia Jo, MD .  insulin aspart (novoLOG) injection 0-5 Units, 0-5 Units, Subcutaneous, QHS, Amelia Jo, MD .  insulin aspart (novoLOG) injection 0-9 Units, 0-9 Units, Subcutaneous, TID WC, Amelia Jo, MD, 1 Units at 09/14/17 1200 .  insulin detemir (LEVEMIR) injection 25 Units, 25 Units, Subcutaneous, Q2200, Amelia Jo, MD .  LORazepam (ATIVAN) tablet 1 mg, 1 mg, Oral, Q6H PRN **OR** LORazepam (ATIVAN) injection 1 mg, 1 mg, Intravenous, Q6H PRN, Mody, Sital, MD .  LORazepam (ATIVAN) tablet 0-4 mg, 0-4 mg, Oral, Q6H **FOLLOWED BY** [START ON 09/16/2017] LORazepam (ATIVAN) tablet 0-4 mg, 0-4 mg, Oral, Q12H, Mody, Sital, MD .  multivitamin with minerals tablet 1 tablet, 1 tablet, Oral, Daily, Mody, Sital, MD, 1 tablet at 09/14/17 0852 .  nebivolol (BYSTOLIC) tablet 20 mg, 20 mg, Oral, Daily, Amelia Jo, MD, 20 mg at 09/14/17 0851 .  ondansetron (ZOFRAN) tablet 4 mg, 4 mg, Oral, Q6H PRN **OR** ondansetron (ZOFRAN) injection 4 mg, 4 mg, Intravenous, Q6H PRN, Amelia Jo, MD .  sodium chloride flush (NS) 0.9 % injection 3 mL, 3 mL, Intravenous, Q12H, Mody, Sital, MD, 3 mL at 09/14/17 0856 .  spironolactone (ALDACTONE) tablet 50 mg, 50 mg, Oral, Daily, Amelia Jo, MD, 50 mg at 09/14/17 0852 .  thiamine (VITAMIN B-1) tablet 100 mg, 100 mg, Oral, Daily, 100 mg at 09/14/17 0852 **OR** thiamine (B-1) injection 100 mg, 100 mg, Intravenous, Daily, Bettey Costa, MD   Physical  exam:  Vitals:   09/13/17 2129 09/14/17 0200 09/14/17 0301  BP: 99/72 127/75 133/80  Pulse: (!) 57 71 65  Resp: 16  19  Temp: 97.9 F (36.6 C)  98 F (36.7 C)  TempSrc: Oral  Oral  SpO2: 100% 99% 100%  Weight: 158 lb (71.7 kg)  149 lb 14.6 oz (68 kg)  Height: 5\' 11"  (1.803 m)  5\' 11"  (1.803 m)   Physical Exam  Constitutional: He is oriented to person, place, and time.  Thin man in no acute distress  HENT:  Head: Normocephalic and atraumatic.  Eyes: Pupils are equal, round, and reactive to light. EOM are normal.  Neck: Normal range of motion.  Cardiovascular: Normal rate, regular rhythm and normal heart sounds.  Pulmonary/Chest: Effort normal and breath sounds normal.  Abdominal: Soft. Bowel sounds are normal. He exhibits distension.  Ascites +  Musculoskeletal: He exhibits no edema.  Neurological: He is alert and oriented to person, place, and time.  Skin: Skin is warm and dry.       CMP Latest Ref Rng & Units 09/14/2017  Glucose 70 - 99 mg/dL 110(H)  BUN 8 - 23 mg/dL 16  Creatinine 0.61 - 1.24 mg/dL 1.24  Sodium 135 - 145 mmol/L 122(L)  Potassium 3.5 - 5.1 mmol/L 4.5  Chloride 98 - 111 mmol/L 92(L)  CO2 22 - 32 mmol/L 26  Calcium 8.9 - 10.3 mg/dL 8.0(L)  Total Protein 6.5 - 8.1 g/dL -  Total Bilirubin 0.3 - 1.2 mg/dL -  Alkaline Phos 38 - 126 U/L -  AST 15 - 41 U/L -  ALT 0 - 44 U/L -   CBC Latest Ref Rng & Units 09/14/2017  WBC 3.8 - 10.6 K/uL 3.5(L)  Hemoglobin 13.0 - 18.0 g/dL 10.9(L)  Hematocrit 40.0 - 52.0 % 32.5(L)  Platelets 150 - 440 K/uL 113(L)    @IMAGES @  Ct Chest W Contrast  Result Date: 08/31/2017 CLINICAL DATA:  Left renal cell carcinoma with left lung metastases. On chemotherapy. Worsening fatigue and shortness of breath on exertion. EXAM: CT CHEST, ABDOMEN, AND PELVIS WITH CONTRAST TECHNIQUE: Multidetector CT imaging of the chest, abdomen and pelvis was performed following the standard protocol during bolus administration of intravenous  contrast. CONTRAST:  48mL ISOVUE-300 IOPAMIDOL (ISOVUE-300) INJECTION 61% COMPARISON:  06/07/2017. FINDINGS: CT CHEST FINDINGS Cardiovascular: Atherosclerotic calcification of the arterial vasculature, including coronary arteries. Heart size normal. No pericardial effusion. Mediastinum/Nodes: Subcarinal lymph node measures 2.4 cm, stable when remeasured. Bihilar lymph nodes measure up to 3.0 x 3.0 cm on the left, also stable when remeasured. No axillary adenopathy. Esophagus is grossly unremarkable. Lungs/Pleura: Image quality is degraded by motion. Hematogenously distributed pulmonary nodules are stable in size and number, measuring up to 2.4 x 2.8 cm in the left lower lobe (series 3, image 87). No pleural fluid. There is narrowing of the left lower lobe bronchus due to extrinsic mass effect from left hilar adenopathy. Musculoskeletal: No worrisome lytic or sclerotic lesions. CT ABDOMEN PELVIS FINDINGS Hepatobiliary: Liver margin is irregular. Stones are seen in the gallbladder. No biliary ductal dilatation. Pancreas: Negative. Spleen: Measures 18.4 cm. Adrenals/Urinary Tract: Right adrenal gland and right kidney are unremarkable. Left adrenalectomy and left nephrectomy. Bladder is low in volume. Stomach/Bowel: Stomach, small bowel and majority of the colon are unremarkable. Difficult to exclude a polypoid lesion in the proximal descending colon (series 2, image 65). Vascular/Lymphatic: Atherosclerotic calcification of the arterial vasculature. Numerous varices. Scattered lymph nodes are not enlarged by CT size criteria. Reproductive: Prostate is visualized.  Bilateral scrotal hydroceles. Other: Massive ascites, increased from 06/07/2017. Midline ventral hernias contain fluid and varices. Large left inguinal hernia contains fluid. Musculoskeletal: No worrisome lytic or sclerotic lesions. IMPRESSION: 1. Mediastinal/hilar adenopathy and pulmonary metastatic disease, stable from 06/07/2017. 2. Very large ascites,  increased from 06/07/2017. 3. Cirrhosis with portal hypertension. 4. Difficult to exclude a polypoid lesion in the proximal descending colon. 5. Cholelithiasis. 6. Large ventral and left inguinal hernias containing fluid. 7.  Aortic atherosclerosis (ICD10-170.0). Electronically Signed   By: Lorin Picket M.D.   On: 08/31/2017 11:43   Ct Abdomen Pelvis W Contrast  Result Date: 09/13/2017 CLINICAL DATA:  Abdominal hernia with liver failure EXAM: CT ABDOMEN AND PELVIS WITH CONTRAST TECHNIQUE: Multidetector CT imaging of the abdomen and pelvis was performed using the standard protocol following bolus administration of intravenous contrast. CONTRAST:  177mL ISOVUE-300 IOPAMIDOL (ISOVUE-300) INJECTION 61% COMPARISON:  CT 08/30/2017, 06/07/2017, PET CT 10/15/2016 FINDINGS: Lower chest: Lung bases again demonstrate multiple pulmonary nodules. Left lower lobe lung mass measures 2.6 x 2.8 cm, compared with 2.4 x 2.8 cm previously. Multiple additional small lung nodules in the lung bases are again noted. No pleural effusion. The heart size is normal. Hepatobiliary: Cirrhotic liver. Heterogenous hypodensity at the junction of the right and left lobes without well defined mass. Appearance is change from 08/30/2017. Calcified gallstone. No biliary dilatation. Pancreas: Unremarkable. No pancreatic ductal dilatation or surrounding inflammatory changes. Spleen: Enlarged, measuring 16 cm. Adrenals/Urinary Tract: Right adrenal gland and kidney are normal. Status post removal of left adrenal gland and left kidney. Bladder is slightly thick walled. Stomach/Bowel: The stomach is nonenlarged. No dilated small bowel. No colon wall thickening. Negative appendix. Vascular/Lymphatic: Nonaneurysmal aorta. Moderate aortic atherosclerosis. No significant adenopathy. Multiple upper abdominal varices. Reproductive: Slightly enlarged prostate Other: Moderate to large volume of ascites in the abdomen. Ventral hernia containing slightly  loculated fluid measuring 9.5 x 2.1 cm, appears decreased compared to most recent CT. Prominent vessels are noted in the hernia. Large left inguinal fluid collection in addition to large left and small right hydrocele. Musculoskeletal: Degenerative changes. No acute or suspicious abnormality. IMPRESSION: 1. Periumbilical ventral hernia containing slightly loculated fluid collection measuring approximately 9.5 x 2.7 cm, slight lead decreased as compared with the most recent CT. Prominent enhancing vessels are noted in the hernia. 2. Cirrhosis of the liver with portal hypertension, splenomegaly and varices. Moderate to large volume of ascites within the abdomen and pelvis. 3. New heterogenous hypoenhancement within the liver, possible mass versus altered perfusion dynamics. 4. Essentially stable pulmonary nodules within the lower lobes. 5. Status post left nephrectomy and removal of left adrenal gland 6. Gallstones 7. Large left inguinal fluid collection and large left hydrocele with small right hydrocele Electronically Signed   By: Donavan Foil M.D.   On: 09/13/2017 23:20   Ct Abdomen Pelvis W Contrast  Result Date: 08/31/2017 CLINICAL DATA:  Left renal cell carcinoma with left lung metastases. On chemotherapy. Worsening fatigue and shortness of breath on exertion. EXAM: CT CHEST, ABDOMEN, AND PELVIS WITH CONTRAST TECHNIQUE: Multidetector CT imaging of the chest, abdomen and pelvis was performed following the standard protocol during bolus administration of intravenous contrast. CONTRAST:  75mL ISOVUE-300 IOPAMIDOL (ISOVUE-300) INJECTION 61% COMPARISON:  06/07/2017. FINDINGS: CT CHEST FINDINGS Cardiovascular: Atherosclerotic calcification of the arterial vasculature, including coronary arteries. Heart size normal. No pericardial effusion. Mediastinum/Nodes: Subcarinal lymph node measures 2.4 cm, stable when remeasured. Bihilar lymph nodes measure up to 3.0 x 3.0 cm on the left, also stable when remeasured. No  axillary adenopathy. Esophagus is grossly unremarkable. Lungs/Pleura: Image quality is degraded by motion. Hematogenously distributed pulmonary nodules are stable in size and number, measuring up to 2.4 x 2.8 cm in the left lower lobe (series 3, image 87). No pleural fluid.  There is narrowing of the left lower lobe bronchus due to extrinsic mass effect from left hilar adenopathy. Musculoskeletal: No worrisome lytic or sclerotic lesions. CT ABDOMEN PELVIS FINDINGS Hepatobiliary: Liver margin is irregular. Stones are seen in the gallbladder. No biliary ductal dilatation. Pancreas: Negative. Spleen: Measures 18.4 cm. Adrenals/Urinary Tract: Right adrenal gland and right kidney are unremarkable. Left adrenalectomy and left nephrectomy. Bladder is low in volume. Stomach/Bowel: Stomach, small bowel and majority of the colon are unremarkable. Difficult to exclude a polypoid lesion in the proximal descending colon (series 2, image 65). Vascular/Lymphatic: Atherosclerotic calcification of the arterial vasculature. Numerous varices. Scattered lymph nodes are not enlarged by CT size criteria. Reproductive: Prostate is visualized.  Bilateral scrotal hydroceles. Other: Massive ascites, increased from 06/07/2017. Midline ventral hernias contain fluid and varices. Large left inguinal hernia contains fluid. Musculoskeletal: No worrisome lytic or sclerotic lesions. IMPRESSION: 1. Mediastinal/hilar adenopathy and pulmonary metastatic disease, stable from 06/07/2017. 2. Very large ascites, increased from 06/07/2017. 3. Cirrhosis with portal hypertension. 4. Difficult to exclude a polypoid lesion in the proximal descending colon. 5. Cholelithiasis. 6. Large ventral and left inguinal hernias containing fluid. 7.  Aortic atherosclerosis (ICD10-170.0). Electronically Signed   By: Lorin Picket M.D.   On: 08/31/2017 11:43    Assessment and plan- Patient is a 62 y.o. male with alcohol related cirrhosis and RCC metastatic to the lung  admitted for drainage of ascitic fluid from superficial abdominal wound  1. Metastatic RCC: He will be getting his next opdivo on 9/25 as scheduled and will see Dr. Grayland Ormond as scheduled  2. Normocytic anemia: likely due to cirrhosis and underlying RCC. Check ferritin iron studies b12 and folate. He has had iron deficiency in the past  3. Hyponatremia: likely due to chronic liver disease and cirrhosis. He will need to f/u with GI closely upon discharge given the change in his diuretic dose. May need nephrology referral as well  4. Ascites: presumably due to cirrhosis. Cytology has not been checked thus far from what I can see. It may be worthwhile to check cytology next time it is drained to rule out malignancy   Visit Diagnosis 1. Other ascites   2. Umbilical hernia without obstruction and without gangrene   3. Hyponatremia   4. Ascites     Dr. Randa Evens, MD, MPH [facility] at [facility] [phone] 09/14/2017  1:40 PM

## 2017-09-14 NOTE — H&P (Signed)
[facility] [place] NAME: Rodney Stewart    MR#:  [id number]  DATE OF BIRTH:  07-Feb-1955  DATE OF ADMISSION:  09/13/2017  PRIMARY CARE PHYSICIAN: [name], [name], MD   REQUESTING/REFERRING PHYSICIAN:   CHIEF COMPLAINT:   Chief Complaint  Patient presents with  . hernia leaking    HISTORY OF PRESENT ILLNESS: Rodney Stewart  is a 62 y.o. male with a known history of advanced metastatic renal cell carcinoma, undergoing chemotherapy, chronic liver failure with chronic tonsillitis, diabetes type 2, BPH and other comorbidities. Patient presented to emergency room with drainage from a wound on his abdominal wall, right on top of his umbilical hernia.  He started to notice a lot of yellow clear liquid draining in the past 6 to 8 hours.  No bleeding no pus discharge, no fever or chills, no abdominal pain.  Patient denies having similar episodes in the past.  Patient continues to drink alcohol, especially beer, on a regular basis. Blood test done emergency room revealed sodium level at 117, creatinine at 1.35, potassium of 5.2.  WBCs within normal limits. CT scan of the abdomen shows periumbilical hernia containing slightly loculated fluid collection measuring approximately 9.5 x 2.7 cm, slight lead decreased as compared with the most recent CT. noted again, cirrhosis of the liver with portal hypertension, splenomegaly and varices. Moderate to large volume of ascites is seen within the abdomen and pelvis. Patient is admitted for further evaluation and treatment.   PAST MEDICAL HISTORY:   Past Medical History:  Diagnosis Date  . Anemia   . BPH (benign prostatic hyperplasia)   . CKD (chronic kidney disease) stage 3, GFR 30-59 ml/min (HCC)   . Clear cell renal cell carcinoma ([place]) 2014   Left Nephrectomy.  . Colon polyps   . Diabetes mellitus without complication ([place])    type 2   . Dyspnea    with exertion  . GERD (gastroesophageal reflux  disease)   . History of gout   . History of nephrectomy    Left  . Hypertension   . Neuropathy   . Renal cell carcinoma of left kidney (HCC)    mets to lungs  . Umbilical hernia     PAST SURGICAL HISTORY:  Past Surgical History:  Procedure Laterality Date  . BACK SURGERY     ruptured disc 1991   . ENDOBRONCHIAL ULTRASOUND N/A 10/14/2015   Procedure: ENDOBRONCHIAL ULTRASOUND;  Surgeon: [name], MD;  Location: [facility] ORS;  Service: Pulmonary;  Laterality: N/A;  . ESOPHAGOGASTRODUODENOSCOPY (EGD) WITH PROPOFOL N/A 04/15/2017   Procedure: ESOPHAGOGASTRODUODENOSCOPY (EGD) WITH PROPOFOL;  Surgeon: Jonathon Bellows, MD;  Location: [facility] ENDOSCOPY;  Service: Gastroenterology;  Laterality: N/A;  Screen for esophageal varices  . EYE SURGERY Bilateral    Cataract Extraction with IOL  . KNEE SURGERY Right   . NEPHRECTOMY     left kidney 2014 renal cell cancer     SOCIAL HISTORY:  Social History   Tobacco Use  . Smoking status: Former Smoker    Packs/day: 1.00    Types: Cigarettes    Last attempt to quit: 04/15/1993    Years since quitting: 24.4  . Smokeless tobacco: Never Used  Substance Use Topics  . Alcohol use: Yes    Alcohol/week: 42.0 standard drinks    Types: 42 Cans of beer per week    Comment: 4-5 cans of beer a day    FAMILY HISTORY:  Family History  Problem Relation  Age of Onset  . Ovarian cancer Mother   . Diabetes Father   . Hypertension Father     DRUG ALLERGIES: No Known Allergies  REVIEW OF SYSTEMS:   CONSTITUTIONAL: No fever, fatigue or weakness.  EYES: No changes in vision.  EARS, NOSE, AND THROAT: No tinnitus or ear pain.  RESPIRATORY: No cough, shortness of breath, wheezing or hemoptysis.  CARDIOVASCULAR: No chest pain, orthopnea, edema.  GASTROINTESTINAL: Positive for abdominal distention secondary to chronic ascites.  Positive for chronic umbilical hernia, now with a wound draining ascites fluid.  No nausea, vomiting, diarrhea or abdominal  pain.  GENITOURINARY: No dysuria, hematuria.  ENDOCRINE: No polyuria, nocturia. HEMATOLOGY: No bleeding. SKIN: No rash or lesion. MUSCULOSKELETAL: No joint pain at this time.   NEUROLOGIC: No focal weakness.  PSYCHIATRY: No anxiety or depression.   MEDICATIONS AT HOME:  Prior to Admission medications   Medication Sig Start Date End Date Taking? Authorizing Provider  acetaminophen (TYLENOL) 325 MG tablet Take 325 mg by mouth every 6 (six) hours as needed.   Yes [provider]  allopurinol (ZYLOPRIM) 100 MG tablet Take 1 tablet (100 mg total) by mouth daily. 08/27/17  Yes [name], [name], MD  ALPRAZolam Duanne Moron) 1 MG tablet Take 1 mg by mouth as needed for sleep. 06/10/16  Yes [provider]  amLODipine (NORVASC) 5 MG tablet Take 1 tablet (5 mg total) by mouth daily. 04/26/17  Yes [name], [name], MD  aspirin 81 MG chewable tablet Chew 81 mg by mouth daily.   Yes [provider]  gabapentin (NEURONTIN) 100 MG capsule Take 2 capsules (200 mg total) by mouth 3 (three) times daily. Patient taking differently: Take 200 mg by mouth 2 (two) times daily.  04/26/17  Yes [name], [name], MD  insulin aspart (NOVOLOG) 100 UNIT/ML FlexPen Before meals 131-180 2 units, 181-240 4 units, 241-300 6 units, 301-350 8 units, 351-400 10 units, >400 12 units 04/26/17  Yes [name], [name], MD  Insulin Detemir (LEVEMIR FLEXTOUCH) 100 UNIT/ML Pen Inject 40 Units into the skin daily at 10 pm. 05/28/17  Yes [name], [name], MD  lovastatin (MEVACOR) 20 MG tablet Take 1 tablet (20 mg total) by mouth daily at 6 PM. 08/08/17  Yes [name], [name], MD  Nebivolol HCl 20 MG TABS Take 20 mg by mouth daily.   Yes [provider]  ONE TOUCH ULTRA TEST test strip Bid 05/28/17  Yes [name], [name], MD  spironolactone (ALDACTONE) 50 MG tablet Take 1 tablet (50 mg total) by mouth daily. 08/16/17  Yes Jonathon Bellows, MD  tamsulosin (FLOMAX) 0.4 MG  CAPS capsule Take 0.4 mg by mouth every other day.  08/02/15  Yes [provider]  albuterol (PROVENTIL HFA;VENTOLIN HFA) 108 (90 Base) MCG/ACT inhaler Inhale 1-2 puffs into the lungs every 6 (six) hours as needed for wheezing or shortness of breath. Patient not taking: Reported on 09/14/2017 05/28/17   [name], [name], MD  colchicine 0.6 MG tablet Take 0.6 mg by mouth every other day.  06/26/15   [provider]  fluticasone Asencion Islam) 50 MCG/ACT nasal spray  03/26/17   [provider]      PHYSICAL EXAMINATION:   VITAL SIGNS: Blood pressure 99/72, pulse (!) 57, temperature 97.9 F (36.6 C), temperature source Oral, resp. rate 16, height 5\' 11"  (1.803 m), weight 71.7 kg, SpO2 100 %.  GENERAL:  62 y.o.-year-old patient lying in the bed with no acute distress.  EYES: Pupils equal, round,  reactive to light and accommodation. No scleral icterus. Extraocular muscles intact.  HEENT: Head atraumatic, normocephalic. Oropharynx and nasopharynx clear.  NECK:  Supple, no jugular venous distention. No thyroid enlargement, no tenderness.  LUNGS: Reduced breath sounds bilaterally, no wheezing, rales,rhonchi or crepitation. No use of accessory muscles of respiration.  CARDIOVASCULAR: S1, S2 normal. No S3/S4.  ABDOMEN: Abdomen is distended, due to large ascites, but otherwise soft, nontender.  There is a large, approximately 8 cm bulging umbilical hernia, reducible.  There is moderate amount of clear yellow fluid draining from the wound on top of the umbilical hernia.  Bowel sounds present.   EXTREMITIES: No pedal edema, cyanosis, or clubbing.  NEUROLOGIC: No focal weakness PSYCHIATRIC: The patient is alert and oriented x 3.  SKIN: Skin is warm and dry, no rash noted.  Superficial wound at umbilical hernia site, as described, above.  LABORATORY PANEL:   CBC Recent Labs  Lab 09/11/17 1031 09/13/17 2144  WBC 3.9 3.3*  HGB 11.9* 12.0*  HCT 35.2* 35.9*  PLT 159 137*  MCV  82.0 83.9  MCH 27.7 28.0  MCHC 33.7 33.4  RDW 15.0* 15.6*  LYMPHSABS 0.7* 0.5*  MONOABS 0.5 0.4  EOSABS 0.2 0.2  BASOSABS 0.0 0.0   ------------------------------------------------------------------------------------------------------------------  Chemistries  Recent Labs  Lab 09/11/17 1031 09/13/17 2144  NA 119* 117*  K 5.2* 5.2*  CL 85* 85*  CO2 25 25  GLUCOSE 69* 243*  BUN 19 19  CREATININE 1.20 1.35*  CALCIUM 9.0 8.2*  AST 51* 52*  ALT 23 27  ALKPHOS 417* 444*  BILITOT 1.9* 1.7*   ------------------------------------------------------------------------------------------------------------------ estimated creatinine clearance is 58.3 mL/min (A) (by C-G formula based on SCr of 1.35 mg/dL (H)). ------------------------------------------------------------------------------------------------------------------ Recent Labs    09/11/17 1031  TSH 9.560*  T4TOTAL 8.8     Coagulation profile Recent Labs  Lab 09/13/17 2144  INR 1.15   ------------------------------------------------------------------------------------------------------------------- No results for input(s): DDIMER in the last 72 hours. -------------------------------------------------------------------------------------------------------------------  Cardiac Enzymes No results for input(s): CKMB, TROPONINI, MYOGLOBIN in the last 168 hours.  Invalid input(s): CK ------------------------------------------------------------------------------------------------------------------ Invalid input(s): POCBNP  ---------------------------------------------------------------------------------------------------------------  Urinalysis    Component Value Date/Time   COLORURINE AMBER (A) 01/25/2017 0831   APPEARANCEUR CLEAR (A) 01/25/2017 0831   APPEARANCEUR CLOUDY 04/26/2012 1620   LABSPEC 1.016 01/25/2017 0831   LABSPEC 1.017 04/26/2012 1620   PHURINE 5.0 01/25/2017 0831   GLUCOSEU >=500 (A) 01/25/2017  0831   GLUCOSEU see comment 04/26/2012 1620   HGBUR NEGATIVE 01/25/2017 0831   BILIRUBINUR NEGATIVE 01/25/2017 0831   BILIRUBINUR see comment 04/26/2012 1620   KETONESUR NEGATIVE 01/25/2017 0831   PROTEINUR NEGATIVE 01/25/2017 0831   NITRITE NEGATIVE 01/25/2017 0831   LEUKOCYTESUR NEGATIVE 01/25/2017 0831   LEUKOCYTESUR see comment 04/26/2012 1620     RADIOLOGY: Ct Abdomen Pelvis W Contrast  Result Date: 09/13/2017 CLINICAL DATA:  Abdominal hernia with liver failure EXAM: CT ABDOMEN AND PELVIS WITH CONTRAST TECHNIQUE: Multidetector CT imaging of the abdomen and pelvis was performed using the standard protocol following bolus administration of intravenous contrast. CONTRAST:  147mL ISOVUE-300 IOPAMIDOL (ISOVUE-300) INJECTION 61% COMPARISON:  CT 08/30/2017, 06/07/2017, PET CT 10/15/2016 FINDINGS: Lower chest: Lung bases again demonstrate multiple pulmonary nodules. Left lower lobe lung mass measures 2.6 x 2.8 cm, compared with 2.4 x 2.8 cm previously. Multiple additional small lung nodules in the lung bases are again noted. No pleural effusion. The heart size is normal. Hepatobiliary: Cirrhotic liver. Heterogenous hypodensity at the junction of the right and left lobes  without well defined mass. Appearance is change from 08/30/2017. Calcified gallstone. No biliary dilatation. Pancreas: Unremarkable. No pancreatic ductal dilatation or surrounding inflammatory changes. Spleen: Enlarged, measuring 16 cm. Adrenals/Urinary Tract: Right adrenal gland and kidney are normal. Status post removal of left adrenal gland and left kidney. Bladder is slightly thick walled. Stomach/Bowel: The stomach is nonenlarged. No dilated small bowel. No colon wall thickening. Negative appendix. Vascular/Lymphatic: Nonaneurysmal aorta. Moderate aortic atherosclerosis. No significant adenopathy. Multiple upper abdominal varices. Reproductive: Slightly enlarged prostate Other: Moderate to large volume of ascites in the abdomen.  Ventral hernia containing slightly loculated fluid measuring 9.5 x 2.1 cm, appears decreased compared to most recent CT. Prominent vessels are noted in the hernia. Large left inguinal fluid collection in addition to large left and small right hydrocele. Musculoskeletal: Degenerative changes. No acute or suspicious abnormality. IMPRESSION: 1. Periumbilical ventral hernia containing slightly loculated fluid collection measuring approximately 9.5 x 2.7 cm, slight lead decreased as compared with the most recent CT. Prominent enhancing vessels are noted in the hernia. 2. Cirrhosis of the liver with portal hypertension, splenomegaly and varices. Moderate to large volume of ascites within the abdomen and pelvis. 3. New heterogenous hypoenhancement within the liver, possible mass versus altered perfusion dynamics. 4. Essentially stable pulmonary nodules within the lower lobes. 5. Status post left nephrectomy and removal of left adrenal gland 6. Gallstones 7. Large left inguinal fluid collection and large left hydrocele with small right hydrocele Electronically Signed   By: Donavan Foil M.D.   On: 09/13/2017 23:20    EKG: Orders placed or performed during the hospital encounter of 02/08/17  . ED EKG  . ED EKG  . EKG    IMPRESSION AND PLAN:  1.  Hyponatremia, likely secondary to liver cirrhosis with chronic ascites, worsened by alcohol abuse, especially with beer.  Patient was advised again about alcohol cessation.  We will start Lasix IV and continue to monitor sodium level closely.  We might need to schedule another paracentesis for this patient.  Nephrology is consulted for further evaluation and treatment. 2.  Advanced, chronic liver cirrhosis, with chronic ascites, portal hypertension, splenomegaly and varices.  Continue management per gastroenterology.  3.  Stage IV renal cell carcinoma, with metastases to both lungs, currently undergoing chemotherapy, per oncology. 4.  Abdominal wall wound, draining  ascites fluid at umbilical hernia site.  General surgery is consulted for further evaluation and treatment. 5.  CKD 3, stable, creatinine at baseline.  Continue to monitor kidney function closely and avoid nephrotoxic medications. 6.  Diabetes type 2.  Will monitor blood sugars before meals and at bedtime and use insulin treatment during the hospital stay.  Overall, patient has a very poor long-term prognosis, due to above mentioned comorbidities.  We will consult palliative care team.  All the records are reviewed and case discussed with ED provider. Management plans discussed with the patient, who is in agreement.  CODE STATUS: Full Code Status History    Date Active Date Inactive Code Status Order ID Comments User Context   01/25/2017 1037 01/27/2017 1704 Full Code [id number]  Bettey Costa, MD ED   09/30/2015 1609 10/03/2015 1530 Full Code [id number]  Demetrios Loll, MD Inpatient       TOTAL TIME TAKING CARE OF THIS PATIENT: 50 minutes.    Amelia Jo M.D on 09/14/2017 at 1:46 AM  Between 7am to 6pm - Pager - [phone]  After 6pm go to [email or web address] - password [email or web address] [place] [facility]  Regional Office  [phone]  CC: Primary care physician; [name], [name], MD

## 2017-09-14 NOTE — ED Notes (Signed)
Patient denies pain and is resting comfortably.  

## 2017-09-15 ENCOUNTER — Inpatient Hospital Stay: Payer: Self-pay

## 2017-09-15 DIAGNOSIS — E43 Unspecified severe protein-calorie malnutrition: Secondary | ICD-10-CM

## 2017-09-15 LAB — MRSA PCR SCREENING: MRSA by PCR: NEGATIVE

## 2017-09-15 LAB — BASIC METABOLIC PANEL
Anion gap: 8 (ref 5–15)
BUN: 20 mg/dL (ref 8–23)
CHLORIDE: 96 mmol/L — AB (ref 98–111)
CO2: 26 mmol/L (ref 22–32)
CREATININE: 1.87 mg/dL — AB (ref 0.61–1.24)
Calcium: 8.2 mg/dL — ABNORMAL LOW (ref 8.9–10.3)
GFR calc Af Amer: 43 mL/min — ABNORMAL LOW (ref >60)
GFR calc non Af Amer: 37 mL/min — ABNORMAL LOW (ref >60)
Glucose, Bld: 59 mg/dL — ABNORMAL LOW (ref 70–99)
Potassium: 4.9 mmol/L (ref 3.5–5.1)
SODIUM: 130 mmol/L — AB (ref 135–145)

## 2017-09-15 LAB — IRON AND TIBC
Iron: 55 ug/dL (ref 45–182)
SATURATION RATIOS: 23 % (ref 17.9–39.5)
TIBC: 243 ug/dL — ABNORMAL LOW (ref 250–450)
UIBC: 188 ug/dL

## 2017-09-15 LAB — FOLATE: Folate: 15.8 ng/mL (ref >5.9)

## 2017-09-15 LAB — GLUCOSE, CAPILLARY
GLUCOSE-CAPILLARY: 99 mg/dL (ref 70–99)
GLUCOSE-CAPILLARY: 169 mg/dL — AB (ref 70–99)
GLUCOSE-CAPILLARY: 206 mg/dL — AB (ref 70–99)
Glucose-Capillary: 41 mg/dL — CL (ref 70–99)
Glucose-Capillary: 152 mg/dL — ABNORMAL HIGH (ref 70–99)
Glucose-Capillary: 190 mg/dL — ABNORMAL HIGH (ref 70–99)

## 2017-09-15 LAB — VITAMIN B12: VITAMIN B 12: 731 pg/mL (ref 180–914)

## 2017-09-15 LAB — TSH: TSH: 5.612 u[IU]/mL — ABNORMAL HIGH (ref 0.350–4.500)

## 2017-09-15 LAB — FERRITIN: Ferritin: 54 ng/mL (ref 24–336)

## 2017-09-15 LAB — CORTISOL: CORTISOL PLASMA: 35 ug/dL

## 2017-09-15 MED ORDER — VANCOMYCIN HCL IN DEXTROSE 750-5 MG/150ML-% IV SOLN
750.0000 mg | INTRAVENOUS | Status: DC
  Administered 2017-09-15 – 2017-09-17 (×3): 750 mg via INTRAVENOUS
  Filled 2017-09-15 (×4): qty 150

## 2017-09-15 MED ORDER — SODIUM CHLORIDE 0.9 % IV SOLN
Freq: Once | INTRAVENOUS | Status: AC
  Administered 2017-09-15: 07:00:00 via INTRAVENOUS

## 2017-09-15 MED ORDER — HYDROCORTISONE NA SUCCINATE PF 100 MG IJ SOLR
100.0000 mg | Freq: Three times a day (TID) | INTRAMUSCULAR | Status: DC
  Administered 2017-09-15 – 2017-09-16 (×5): 100 mg via INTRAVENOUS
  Filled 2017-09-15 (×5): qty 2

## 2017-09-15 MED ORDER — ALBUMIN HUMAN 25 % IV SOLN
25.0000 g | Freq: Once | INTRAVENOUS | Status: AC
  Administered 2017-09-15: 25 g via INTRAVENOUS
  Filled 2017-09-15: qty 100

## 2017-09-15 MED ORDER — VANCOMYCIN HCL IN DEXTROSE 1-5 GM/200ML-% IV SOLN
1000.0000 mg | Freq: Once | INTRAVENOUS | Status: AC
  Administered 2017-09-15: 1000 mg via INTRAVENOUS
  Filled 2017-09-15: qty 200

## 2017-09-15 MED ORDER — ALUM & MAG HYDROXIDE-SIMETH 200-200-20 MG/5ML PO SUSP
15.0000 mL | ORAL | Status: DC | PRN
  Administered 2017-09-15: 15 mL via ORAL
  Filled 2017-09-15 (×2): qty 30

## 2017-09-15 MED ORDER — ENSURE ENLIVE PO LIQD
237.0000 mL | Freq: Three times a day (TID) | ORAL | Status: DC
  Administered 2017-09-15 – 2017-09-16 (×2): 237 mL via ORAL

## 2017-09-15 MED ORDER — MIDODRINE HCL 5 MG PO TABS
5.0000 mg | ORAL_TABLET | Freq: Once | ORAL | Status: AC
  Administered 2017-09-15: 5 mg via ORAL
  Filled 2017-09-15: qty 1

## 2017-09-15 MED ORDER — FENTANYL CITRATE (PF) 100 MCG/2ML IJ SOLN
12.5000 ug | INTRAMUSCULAR | Status: DC | PRN
  Administered 2017-09-15: 12.5 ug via INTRAVENOUS
  Filled 2017-09-15: qty 2

## 2017-09-15 MED ORDER — ONDANSETRON HCL 4 MG/2ML IJ SOLN
4.0000 mg | Freq: Once | INTRAMUSCULAR | Status: AC
  Administered 2017-09-15: 4 mg via INTRAVENOUS
  Filled 2017-09-15: qty 2

## 2017-09-15 MED ORDER — MAGIC MOUTHWASH
5.0000 mL | Freq: Four times a day (QID) | ORAL | Status: DC
  Administered 2017-09-15 – 2017-09-18 (×9): 5 mL via ORAL
  Filled 2017-09-15 (×13): qty 10

## 2017-09-15 MED ORDER — SODIUM CHLORIDE 0.9 % IV SOLN
INTRAVENOUS | Status: DC | PRN
  Administered 2017-09-15: 500 mL via INTRAVENOUS
  Administered 2017-09-17: 250 mL via INTRAVENOUS

## 2017-09-15 MED ORDER — LIDOCAINE VISCOUS HCL 2 % MT SOLN
5.0000 mL | Freq: Four times a day (QID) | OROMUCOSAL | Status: DC
  Administered 2017-09-15 – 2017-09-17 (×7): 5 mL via OROMUCOSAL
  Filled 2017-09-15 (×11): qty 15

## 2017-09-15 MED ORDER — DEXTROSE 50 % IV SOLN
INTRAVENOUS | Status: AC
  Administered 2017-09-15: 07:00:00 50 mL via INTRAVENOUS
  Filled 2017-09-15: qty 50

## 2017-09-15 MED ORDER — TAMSULOSIN HCL 0.4 MG PO CAPS
0.4000 mg | ORAL_CAPSULE | ORAL | Status: DC
  Administered 2017-09-15 – 2017-09-17 (×2): 0.4 mg via ORAL
  Filled 2017-09-15 (×2): qty 1

## 2017-09-15 MED ORDER — SODIUM CHLORIDE 0.9 % IV BOLUS
500.0000 mL | Freq: Once | INTRAVENOUS | Status: AC
  Administered 2017-09-15: 500 mL via INTRAVENOUS

## 2017-09-15 MED ORDER — SODIUM CHLORIDE 0.9 % IV SOLN
1.0000 g | Freq: Two times a day (BID) | INTRAVENOUS | Status: DC
  Administered 2017-09-15 – 2017-09-17 (×5): 1 g via INTRAVENOUS
  Filled 2017-09-15 (×6): qty 1

## 2017-09-15 MED ORDER — NOREPINEPHRINE 4 MG/250ML-% IV SOLN
0.0000 ug/min | INTRAVENOUS | Status: DC
  Administered 2017-09-15: 2 ug/min via INTRAVENOUS
  Filled 2017-09-15: qty 250

## 2017-09-15 MED ORDER — HYDROMORPHONE HCL 1 MG/ML IJ SOLN
0.5000 mg | Freq: Once | INTRAMUSCULAR | Status: AC
  Administered 2017-09-15: 01:00:00 0.5 mg via INTRAVENOUS
  Filled 2017-09-15: qty 1

## 2017-09-15 MED ORDER — MAGIC MOUTHWASH W/LIDOCAINE: 10.0000 mL | Freq: Four times a day (QID) | ORAL | Status: DC

## 2017-09-15 MED ORDER — DEXTROSE 50 % IV SOLN
1.0000 | INTRAVENOUS | Status: AC
  Administered 2017-09-15: 50 mL via INTRAVENOUS

## 2017-09-15 NOTE — Progress Notes (Signed)
Dgt has been notified of condition change. TC report to [facility] in Step down with pt accepted. Hypotension improved. BS now in acceptable limits. Pt reports he feels better with vision back to normal. Will transport to Step down.

## 2017-09-15 NOTE — Progress Notes (Signed)
Initial Nutrition Assessment  DOCUMENTATION CODES:   Severe malnutrition in context of chronic illness  INTERVENTION:  Plan is to liberalize diet to regular and liberalize fluid restriction for now.  Provide Ensure Enlive po TID between meals, each supplement provides 350 kcal and 20 grams of protein.  Provide Hormel Shake (Vital Cuisine) po TID with meals, each supplement provides 520 kcal and 22 grams of protein.  Continue MVI daily, folic acid 1 mg daily, thiamine 100 mg daily. It is important that patient continues these after discharge.  Discussed patient's nutrition status. Encouraged intake of small, frequent meals throughout the day that are calorie- and protein-dense. Discussed ideas with patient and daughters.  Monitor magnesium, potassium, and phosphorus daily for at least 3 days, MD to replete as needed, as pt is at risk for refeeding syndrome given severe malnutrition, EtOH abuse.  NUTRITION DIAGNOSIS:   Severe Malnutrition related to chronic illness(EtOH abuse, cirrhosis, metastatic RCC, CKD) as evidenced by severe fat depletion, severe muscle depletion, 19.7 percent weight loss over 7 months.  GOAL:   Patient will meet greater than or equal to 90% of their needs  MONITOR:   PO intake, Supplement acceptance, Labs, Weight trends, I & O's  REASON FOR ASSESSMENT:   Malnutrition Screening Tool    ASSESSMENT:   62 year old male with PMHx of EtOH abuse, alcohol related cirrhosis, HTN, RCC of left kidney with mets to lung s/p left nephrectomy in 2014 on palliative opdivo, CKD stage III, BPH, gout, neuropathy, GERD, umbilical hernia, DM type 2 who is admitted with drainage from periumbilical hernia (plan is for conservative management with abdominal binder and paracentesis), also with hyponatremia.   -This morning patient transferred to step-down with hypotension after large volume drainage from hernia. Patient is receiving saline bolus and albumin and may require  pressors.  Met with patient and his daughters at bedside. Patient reports he has had a poor appetite for a while now. He is experiencing early satiety and taste changes. He lives alone and is not able to cook for himself. He typically eats 2 meals per day. He skips breakfast. For lunch he may have a sandwich, cereal, oatmeal, or soup. For dinner he usually only has vegetables. He used to eat chicken but has not been eating much chicken lately. His daughters report they have been concerned about his protein and calorie intake being inadequate. They calculated his intake at home one day and he was taking in <1000 kcal per day per their report. They purchased a protein shake that he takes every day that has about 1000 kcal and 30 grams of protein. He drinks this once per day. Daughters also report that one issue affecting intake is that he drinks beer and [employer] hard lemonade throughout the day.  Patient reports his UBW was 180 lbs. He reports his weight does fluctuate with fluid, but he has also lost a significant amount of true weight/lean body mass since the beginning of the year. Per chart patient was 182.4 lbs on 02/19/2017. He is currently 66.4 kg (146.4 lbs). He has lost 36 lbs (19.7% body weight) over the past 7 months, which is significant for time frame.  Medications reviewed and include: allopurinol, Colace, folic acid 1 mg daily, gabapentin, Solu-Cortef, Novolog 0-9 units TID, Novolog 0-5 units QHS, Levemir 25 units QHS, Ativan for CIWA, MVI, Thiamine 100 mg daily, NS @ 40 mL/hr, meropenem.  Labs reviewed: CBG 40-236, Sodium 130, Chloride 96, Creatinine 1.87, eGFR 37, Iron 55, TIBC 243,  Ferritin 54, Folate 15.8.  Discussed with RN. Patient taking in very little at meals. Also discussed with MD. Plan is to liberalize diet and fluid restriction for now.  NUTRITION - FOCUSED PHYSICAL EXAM:    Most Recent Value  Orbital Region  Severe depletion  Upper Arm Region  Severe depletion  Thoracic and  Lumbar Region  Unable to assess  Buccal Region  Severe depletion  Temple Region  Severe depletion  Clavicle Bone Region  Severe depletion  Clavicle and Acromion Bone Region  Severe depletion  Scapular Bone Region  Severe depletion  Dorsal Hand  Severe depletion  Patellar Region  Severe depletion  Anterior Thigh Region  Severe depletion  Posterior Calf Region  Severe depletion  Edema (RD Assessment)  None  Hair  Reviewed  Eyes  Reviewed  Mouth  Unable to assess  Skin  Reviewed  Nails  Reviewed     Diet Order:   Diet Order            Diet renal with fluid restriction Fluid restriction: 1500 mL Fluid; Room service appropriate? Yes; Fluid consistency: Thin  Diet effective now              EDUCATION NEEDS:   Education needs have been addressed  Skin:  Skin Assessment: Skin Integrity Issues:(open wound/umbilical hernia with serous drainage and ostomy bag in place)  Last BM:  09/14/2017 per chart but no BM characteristics documented  Height:   Ht Readings from Last 1 Encounters:  09/15/17 5' 11"  (5.320 m)    Weight:   Wt Readings from Last 1 Encounters:  09/15/17 66.4 kg    Ideal Body Weight:  78.2 kg  BMI:  Body mass index is 20.42 kg/m.  Estimated Nutritional Needs:   Kcal:  1945-2240 (MSJ x 1.3-1.5)  Protein:  100-115 grams (1.5-1.7 grams/kg)  Fluid:  1.7-1.9 L/day (25-30 mL/kg)  Willey Blade, MS, RD, LDN Office: [phone] Pager: [phone] After Hours/Weekend Pager: [phone]

## 2017-09-15 NOTE — Progress Notes (Signed)
Spoke with Junie Panning, RN concerning PICC placement. She was informed that we don't offer PICC placement on weekends yet. The patient's blood pressure is too low to safely assess and insert PICC. Patient has two PIVs. Advised if PICC is needed urgently, to call CVW to place line.

## 2017-09-15 NOTE — Progress Notes (Addendum)
Pt reports he feels weak; denies pain. Speech slow; skin warm/dry states he is having decreaed vision. Noted chemistry blood sugar 59. Recheck BP remains low with pulse ox 90-91 % RA. Notified charge nurse and rapid response called. 02 @ 2l/[place] applied. POCT glucose 40/41. TC with Dr. Benjie Karvonen with report given with orders obtained. D50 ampule IV x 1 given. 1049ml NS bolus started. Rapid Response nurse in and assessed. Will transfer pt when bed ready.

## 2017-09-15 NOTE — Progress Notes (Signed)
Pharmacy Antibiotic Note  Rodney Stewart is a 62 y.o. male admitted on 09/13/2017 with acute hyponatremia and ascites. Patient transferred to ICU 9/15 am after rapid response called for hypotension and hypoglycemia. Patient with large volume ascites with plan for conservative management per surgery at this time. Pharmacy has been consulted for vancomycin dosing. Patient is also started on meropenem.  Plan: Meropenem 1 g IV q12h (adjusted for CrCl)  Vancomycin 1 g given today @ 1413. Will start vancomycin 750 mg IV q18h to start today @ 2000. Goal trough 15-20 mcg/mL. Will order trough prior to 5th dose.  Height: 5\' 11"  (180.3 cm) Weight: 146 lb 6.2 oz (66.4 kg) IBW/kg (Calculated) : 75.3  Temp (24hrs), Avg:98 F (36.7 C), Min:97.4 F (36.3 C), Max:98.9 F (37.2 C)  Recent Labs  Lab 09/11/17 1031 09/13/17 2144 09/14/17 0443 09/15/17 0506  WBC 3.9 3.3* 3.5*  --   CREATININE 1.20 1.35* 1.24 1.87*    Estimated Creatinine Clearance: 39 mL/min (A) (by C-G formula based on SCr of 1.87 mg/dL (H)).    No Known Allergies  Antimicrobials this admission: Meropenem 9/15 >> Vancomycin 9/15 >>  Dose adjustments this admission: N/A  Microbiology results: 9/15 MRSA PCR: negative  Thank you for allowing pharmacy to be a part of this patient's care.  Tawnya Crook, PharmD Pharmacy Resident  09/15/2017 11:39 AM

## 2017-09-15 NOTE — Progress Notes (Signed)
Pts daughter Barnetta Chapel made aware that pt being transferred to ICU

## 2017-09-15 NOTE — Consult Note (Signed)
Reason for Consult:Hypotension Referring Physician: Dr. Samuella Cota B Lecy is an 62 y.o. male.   HPI: Mr. Rodney Stewart is a 62 year old gentleman with a past medical history remarkable for chronic renal insufficiency history of left nephrectomy secondary to renal cell carcinoma, with lung metastases, last receivedpalliative chemotherapy on 9/11, chronic cirrhosis secondary to chronic alcohol consumption,diabetes, gastroesophageal reflux disease, hypertension,neuropathy presented to the emergency department with complaint of drainage from the abdominal wall wound.CT scan of the abdomen showed periumbilical hernia, cirrhosis of liver, portal hypertension, splenomegaly and varices with large volume of ascites. He was seen by Dr. Burt Knack who favored conservative management with an abdominal binder and paracentesis. This morning patient's blood pressure decreased with increased drainage along with hypoglycemia and he was subsequently brought to the intensive care unit for further evaluation and management  Past Medical History:  Diagnosis Date  . Anemia   . BPH (benign prostatic hyperplasia)   . CKD (chronic kidney disease) stage 3, GFR 30-59 ml/min (HCC)   . Clear cell renal cell carcinoma ([place]) 2014   Left Nephrectomy.  . Colon polyps   . Diabetes mellitus without complication ([place])    type 2   . Dyspnea    with exertion  . GERD (gastroesophageal reflux disease)   . History of gout   . History of nephrectomy    Left  . Hypertension   . Neuropathy   . Renal Cancer    Renal Cancer  . Renal cell carcinoma of left kidney (HCC)    mets to lungs  . Umbilical hernia     Past Surgical History:  Procedure Laterality Date  . BACK SURGERY     ruptured disc 1991   . ENDOBRONCHIAL ULTRASOUND N/A 10/14/2015   Procedure: ENDOBRONCHIAL ULTRASOUND;  Surgeon: [name], MD;  Location: [facility] ORS;  Service: Pulmonary;  Laterality: N/A;  . ESOPHAGOGASTRODUODENOSCOPY (EGD) WITH PROPOFOL N/A 04/15/2017    Procedure: ESOPHAGOGASTRODUODENOSCOPY (EGD) WITH PROPOFOL;  Surgeon: Jonathon Bellows, MD;  Location: [facility] ENDOSCOPY;  Service: Gastroenterology;  Laterality: N/A;  Screen for esophageal varices  . EYE SURGERY Bilateral    Cataract Extraction with IOL  . KNEE SURGERY Right   . NEPHRECTOMY     left kidney 2014 renal cell cancer     Family History  Problem Relation Age of Onset  . Ovarian cancer Mother   . Diabetes Father   . Hypertension Father     Social History:  reports that he quit smoking about 24 years ago. His smoking use included cigarettes. He smoked 1.00 pack per day. He has never used smokeless tobacco. He reports that he drinks about 42.0 standard drinks of alcohol per week. He reports that he does not use drugs.  Allergies: No Known Allergies  Medications: I have reviewed the patient's current medications.  Results for orders placed or performed during the hospital encounter of 09/13/17 (from the past 48 hour(s))  Basic metabolic panel     Status: Abnormal   Collection Time: 09/13/17  9:44 PM  Result Value Ref Range   Sodium 117 (LL) 135 - 145 mmol/L    Comment: CRITICAL RESULT CALLED TO, READ BACK BY AND VERIFIED WITH Adelyna Brockman HOFFMASTER 09/13/17 2224 JML    Potassium 5.2 (H) 3.5 - 5.1 mmol/L   Chloride 85 (L) 98 - 111 mmol/L   CO2 25 22 - 32 mmol/L   Glucose, Bld 243 (H) 70 - 99 mg/dL   BUN 19 8 - 23 mg/dL   Creatinine, Ser 1.35 (H) 0.61 -  1.24 mg/dL   Calcium 8.2 (L) 8.9 - 10.3 mg/dL   GFR calc non Af Amer 55 (L) >60 mL/min   GFR calc Af Amer >60 >60 mL/min    Comment: (NOTE) The eGFR has been calculated using the CKD EPI equation. This calculation has not been validated in all clinical situations. eGFR's persistently <60 mL/min signify possible Chronic Kidney Disease.    Anion gap 7 5 - 15    Comment: Performed at [facility], [address] [place], [place] [address]  Hepatic function panel     Status: Abnormal   Collection Time: 09/13/17  9:44 PM   Result Value Ref Range   Total Protein 6.6 6.5 - 8.1 g/dL   Albumin 3.0 (L) 3.5 - 5.0 g/dL   AST 52 (H) 15 - 41 U/L   ALT 27 0 - 44 U/L   Alkaline Phosphatase 444 (H) 38 - 126 U/L   Total Bilirubin 1.7 (H) 0.3 - 1.2 mg/dL   Bilirubin, Direct 0.7 (H) 0.0 - 0.2 mg/dL   Indirect Bilirubin 1.0 (H) 0.3 - 0.9 mg/dL    Comment: Performed at [facility], [address] [place], [place] [address]  CBC with Differential     Status: Abnormal   Collection Time: 09/13/17  9:44 PM  Result Value Ref Range   WBC 3.3 (L) 3.8 - 10.6 K/uL   RBC 4.28 (L) 4.40 - 5.90 MIL/uL   Hemoglobin 12.0 (L) 13.0 - 18.0 g/dL   HCT 35.9 (L) 40.0 - 52.0 %   MCV 83.9 80.0 - 100.0 fL   MCH 28.0 26.0 - 34.0 pg   MCHC 33.4 32.0 - 36.0 g/dL   RDW 15.6 (H) 11.5 - 14.5 %   Platelets 137 (L) 150 - 440 K/uL   Neutrophils Relative % 65 %   Neutro Abs 2.2 1.4 - 6.5 K/uL   Lymphocytes Relative 17 %   Lymphs Abs 0.5 (L) 1.0 - 3.6 K/uL   Monocytes Relative 11 %   Monocytes Absolute 0.4 0.2 - 1.0 K/uL   Eosinophils Relative 6 %   Eosinophils Absolute 0.2 0 - 0.7 K/uL   Basophils Relative 1 %   Basophils Absolute 0.0 0 - 0.1 K/uL    Comment: Performed at [facility], [address] [place], [place] [address]  Protime-INR     Status: None   Collection Time: 09/13/17  9:44 PM  Result Value Ref Range   Prothrombin Time 14.6 11.4 - 15.2 seconds   INR 1.15     Comment: Performed at [facility], [address] [place], [place] [address]  Basic metabolic panel     Status: Abnormal   Collection Time: 09/14/17  4:43 AM  Result Value Ref Range   Sodium 122 (L) 135 - 145 mmol/L   Potassium 4.5 3.5 - 5.1 mmol/L   Chloride 92 (L) 98 - 111 mmol/L   CO2 26 22 - 32 mmol/L   Glucose, Bld 110 (H) 70 - 99 mg/dL   BUN 16 8 - 23 mg/dL   Creatinine, Ser 1.24 0.61 - 1.24 mg/dL   Calcium 8.0 (L) 8.9 - 10.3 mg/dL   GFR calc non Af Amer >60 >60 mL/min   GFR calc Af Amer >60 >60 mL/min    Comment:  (NOTE) The eGFR has been calculated using the CKD EPI equation. This calculation has not been validated in all clinical situations. eGFR's persistently <60 mL/min signify possible Chronic Kidney Disease.  Anion gap 4 (L) 5 - 15    Comment: Performed at [facility], [address] [place], [place] [address]  CBC     Status: Abnormal   Collection Time: 09/14/17  4:43 AM  Result Value Ref Range   WBC 3.5 (L) 3.8 - 10.6 K/uL   RBC 3.88 (L) 4.40 - 5.90 MIL/uL   Hemoglobin 10.9 (L) 13.0 - 18.0 g/dL   HCT 32.5 (L) 40.0 - 52.0 %   MCV 83.7 80.0 - 100.0 fL   MCH 28.2 26.0 - 34.0 pg   MCHC 33.7 32.0 - 36.0 g/dL   RDW 16.0 (H) 11.5 - 14.5 %   Platelets 113 (L) 150 - 440 K/uL    Comment: Performed at [facility], [address] [place], [place] [address]  Glucose, capillary     Status: Abnormal   Collection Time: 09/14/17  7:50 AM  Result Value Ref Range   Glucose-Capillary 139 (H) 70 - 99 mg/dL  Glucose, capillary     Status: Abnormal   Collection Time: 09/14/17 11:32 AM  Result Value Ref Range   Glucose-Capillary 139 (H) 70 - 99 mg/dL  Glucose, capillary     Status: Abnormal   Collection Time: 09/14/17  4:33 PM  Result Value Ref Range   Glucose-Capillary 230 (H) 70 - 99 mg/dL  Glucose, capillary     Status: Abnormal   Collection Time: 09/14/17  8:40 PM  Result Value Ref Range   Glucose-Capillary 236 (H) 70 - 99 mg/dL  Basic metabolic panel     Status: Abnormal   Collection Time: 09/15/17  5:06 AM  Result Value Ref Range   Sodium 130 (L) 135 - 145 mmol/L   Potassium 4.9 3.5 - 5.1 mmol/L   Chloride 96 (L) 98 - 111 mmol/L   CO2 26 22 - 32 mmol/L   Glucose, Bld 59 (L) 70 - 99 mg/dL   BUN 20 8 - 23 mg/dL   Creatinine, Ser 1.87 (H) 0.61 - 1.24 mg/dL   Calcium 8.2 (L) 8.9 - 10.3 mg/dL   GFR calc non Af Amer 37 (L) >60 mL/min   GFR calc Af Amer 43 (L) >60 mL/min    Comment: (NOTE) The eGFR has been calculated using the CKD EPI equation. This calculation has  not been validated in all clinical situations. eGFR's persistently <60 mL/min signify possible Chronic Kidney Disease.    Anion gap 8 5 - 15    Comment: Performed at [facility], [address] [place], [place] [address]  Ferritin     Status: None   Collection Time: 09/15/17  5:06 AM  Result Value Ref Range   Ferritin 54 24 - 336 ng/mL    Comment: Performed at [facility], [address] [place], [place] [address]  Iron and TIBC     Status: Abnormal   Collection Time: 09/15/17  5:06 AM  Result Value Ref Range   Iron 55 45 - 182 ug/dL   TIBC 243 (L) 250 - 450 ug/dL   Saturation Ratios 23 17.9 - 39.5 %   UIBC 188 ug/dL    Comment: Performed at [facility], [address] [place], [place] [address]  Folate     Status: None   Collection Time: 09/15/17  5:06 AM  Result Value Ref Range   Folate 15.8 >5.9 ng/mL    Comment: Performed at [facility], [address] [place], [place] [address]  Glucose, capillary  Status: Abnormal   Collection Time: 09/15/17  7:19 AM  Result Value Ref Range   Glucose-Capillary 40 (LL) 70 - 99 mg/dL   Comment 1 Notify RN   Glucose, capillary     Status: Abnormal   Collection Time: 09/15/17  7:21 AM  Result Value Ref Range   Glucose-Capillary 41 (LL) 70 - 99 mg/dL   Comment 1 Notify RN   Glucose, capillary     Status: Abnormal   Collection Time: 09/15/17  7:40 AM  Result Value Ref Range   Glucose-Capillary 152 (H) 70 - 99 mg/dL    Ct Abdomen Pelvis W Contrast  Result Date: 09/13/2017 CLINICAL DATA:  Abdominal hernia with liver failure EXAM: CT ABDOMEN AND PELVIS WITH CONTRAST TECHNIQUE: Multidetector CT imaging of the abdomen and pelvis was performed using the standard protocol following bolus administration of intravenous contrast. CONTRAST:  136m ISOVUE-300 IOPAMIDOL (ISOVUE-300) INJECTION 61% COMPARISON:  CT 08/30/2017, 06/07/2017, PET CT 10/15/2016 FINDINGS: Lower chest: Lung bases again demonstrate  multiple pulmonary nodules. Left lower lobe lung mass measures 2.6 x 2.8 cm, compared with 2.4 x 2.8 cm previously. Multiple additional small lung nodules in the lung bases are again noted. No pleural effusion. The heart size is normal. Hepatobiliary: Cirrhotic liver. Heterogenous hypodensity at the junction of the right and left lobes without well defined mass. Appearance is change from 08/30/2017. Calcified gallstone. No biliary dilatation. Pancreas: Unremarkable. No pancreatic ductal dilatation or surrounding inflammatory changes. Spleen: Enlarged, measuring 16 cm. Adrenals/Urinary Tract: Right adrenal gland and kidney are normal. Status post removal of left adrenal gland and left kidney. Bladder is slightly thick walled. Stomach/Bowel: The stomach is nonenlarged. No dilated small bowel. No colon wall thickening. Negative appendix. Vascular/Lymphatic: Nonaneurysmal aorta. Moderate aortic atherosclerosis. No significant adenopathy. Multiple upper abdominal varices. Reproductive: Slightly enlarged prostate Other: Moderate to large volume of ascites in the abdomen. Ventral hernia containing slightly loculated fluid measuring 9.5 x 2.1 cm, appears decreased compared to most recent CT. Prominent vessels are noted in the hernia. Large left inguinal fluid collection in addition to large left and small right hydrocele. Musculoskeletal: Degenerative changes. No acute or suspicious abnormality. IMPRESSION: 1. Periumbilical ventral hernia containing slightly loculated fluid collection measuring approximately 9.5 x 2.7 cm, slight lead decreased as compared with the most recent CT. Prominent enhancing vessels are noted in the hernia. 2. Cirrhosis of the liver with portal hypertension, splenomegaly and varices. Moderate to large volume of ascites within the abdomen and pelvis. 3. New heterogenous hypoenhancement within the liver, possible mass versus altered perfusion dynamics. 4. Essentially stable pulmonary nodules within  the lower lobes. 5. Status post left nephrectomy and removal of left adrenal gland 6. Gallstones 7. Large left inguinal fluid collection and large left hydrocele with small right hydrocele Electronically Signed   By: KDonavan FoilM.D.   On: 09/13/2017 23:20    ROS  Patient complains of generalized fatigue and malaise, positive abdominal distention. All else negative see history of present illness for pertinent positives  Blood pressure (!) 80/53, pulse 63, temperature 97.9 F (36.6 C), temperature source Oral, resp. rate 19, height _0  (1.803 m), weight 66.4 kg, SpO2 97 %. Physical Exam  Vital signs: Please see above listed vital signs Patient is awake, alert, oriented in no acute distress, appears chronically ill HEENT: Trachea midline, no thyromegaly appreciated, no bruits auscultated Cardiovascular: Regular rhythm Pulmonary: Clear to auscultation Abdominal: Distended abdomen, ascites, umbilical hernia, has a ostomy bag drain  Assessment/Plan:   Hypotension. Patient had  large volume drainage,will give saline bolus along with albumin. Will obtain PICC line placement and if required start on norepinephrine. Will add vancomycin to meropenem  Hypoglycemia. Patient received 1 amp of D50 with improved blood sugar  Hyponatremia. Has improved as recent as 1:30  Renal insufficiency. Creatinine is 1.87 which has worsened  Metastatic renal cell carcinoma. That is post left nephrectomy, metastatic disease to the lung, on palliative chemotherapy by Dr. Grayland Ormond  Cirrhosis. Long-standing history of alcohol use with cirrhosis  Abdominal hernia with abdominal wall drainage. As been seen by Dr. Burt Knack from surgery. Patient is a high surgical risk secondary to multiple comorbidities favoring conservative management at this time  Jair Lindblad 09/15/2017, 9:15 AM

## 2017-09-15 NOTE — Progress Notes (Signed)
Pharmacist-Physician Communication  Meropenem 1 g IV q8h ordered for this patient with sepsis. Will adjust to 1 g IV q12h for CrCl.  Tawnya Crook, PharmD Pharmacy Resident  09/15/2017 9:49 AM

## 2017-09-15 NOTE — Progress Notes (Signed)
[facility]  ROUNDING NOTE   Subjective:  Patient moved over to the intensive care unit secondary to hypoglycemia and hypotension. Blood pressure currently 78/52. Good urine output noted yesterday. Serum sodium up to 130. Currently awake, alert, conversant. Daughters at bedside. Renal function worse today as creatinine 1.87. Ostomy bag placed over hernia with drainage noted.   Objective:  Vital signs in last 24 hours:  Temp:  [97.4 F (36.3 C)-98.9 F (37.2 C)] 97.9 F (36.6 C) (09/15 0811) Pulse Rate:  [63-78] 66 (09/15 1100) Resp:  [11-20] 11 (09/15 1100) BP: (67-108)/(44-71) 78/52 (09/15 1100) SpO2:  [91 %-100 %] 98 % (09/15 1100) Weight:  [66.3 kg-66.4 kg] 66.4 kg (09/15 0811)  Weight change: -5.368 kg Filed Weights   09/14/17 0301 09/15/17 0500 09/15/17 0811  Weight: 68 kg 66.3 kg 66.4 kg    Intake/Output: I/O last 3 completed shifts: In: 2425.8 [P.O.:846; I.V.:579.8; IV Piggyback:1000] Out: 2330 [Urine:1450; Other:2200]   Intake/Output this shift:  No intake/output data recorded.  Physical Exam: General: No acute distress  Head: Normocephalic, atraumatic. Moist oral mucosal membranes  Eyes: Anicteric  Neck: Supple, trachea midline  Lungs:  Clear to auscultation, normal effort  Heart: S1S2 no rubs  Abdomen:  Soft, nontender, bowel sounds present, mild distension  Extremities: no peripheral edema.  Neurologic: Awake, alert, following commands  Skin: No lesions       Basic Metabolic Panel: Recent Labs  Lab 09/11/17 1031 09/13/17 04/06/2142 09/14/17 0443 09/15/17 0506  NA 119* 117* 122* 130*  K 5.2* 5.2* 4.5 4.9  CL 85* 85* 92* 96*  CO2 25 25 26 26   GLUCOSE 69* 243* 110* 59*  BUN 19 19 16 20   CREATININE 1.20 1.35* 1.24 1.87*  CALCIUM 9.0 8.2* 8.0* 8.2*    Liver Function Tests: Recent Labs  Lab 09/11/17 1031 09/13/17 2144  AST 51* 52*  ALT 23 27  ALKPHOS 417* 444*  BILITOT 1.9* 1.7*  PROT 7.2 6.6  ALBUMIN 3.3* 3.0*   No  results for input(s): LIPASE, AMYLASE in the last 168 hours. No results for input(s): AMMONIA in the last 168 hours.  CBC: Recent Labs  Lab 09/11/17 1031 09/13/17 04-06-42 09/14/17 0443  WBC 3.9 3.3* 3.5*  NEUTROABS 2.4 2.2  --   HGB 11.9* 12.0* 10.9*  HCT 35.2* 35.9* 32.5*  MCV 82.0 83.9 83.7  PLT 159 137* 113*    Cardiac Enzymes: No results for input(s): CKTOTAL, CKMB, CKMBINDEX, TROPONINI in the last 168 hours.  BNP: Invalid input(s): POCBNP  CBG: Recent Labs  Lab 09/14/17 04/06/38 09/15/17 0719 09/15/17 0721 09/15/17 0740 09/15/17 1139  GLUCAP 236* 40* 41* 152* 99    Microbiology: Results for orders placed or performed during the hospital encounter of 09/13/17  MRSA PCR Screening     Status: None   Collection Time: 09/15/17  8:17 AM  Result Value Ref Range Status   MRSA by PCR NEGATIVE NEGATIVE Final    Comment:        The GeneXpert MRSA Assay (FDA approved for NASAL specimens only), is one component of a comprehensive MRSA colonization surveillance program. It is not intended to diagnose MRSA infection nor to guide or monitor treatment for MRSA infections. Performed at [facility], [address] [place], [place] [address]     Coagulation Studies: Recent Labs    09/13/17 04-06-42  LABPROT 14.6  INR 1.15    Urinalysis: No results for input(s): COLORURINE, LABSPEC, PHURINE, GLUCOSEU, HGBUR, BILIRUBINUR, KETONESUR, PROTEINUR, UROBILINOGEN, NITRITE,  LEUKOCYTESUR in the last 72 hours.  Invalid input(s): APPERANCEUR    Imaging: Ct Abdomen Pelvis W Contrast  Result Date: 09/13/2017 CLINICAL DATA:  Abdominal hernia with liver failure EXAM: CT ABDOMEN AND PELVIS WITH CONTRAST TECHNIQUE: Multidetector CT imaging of the abdomen and pelvis was performed using the standard protocol following bolus administration of intravenous contrast. CONTRAST:  13mL ISOVUE-300 IOPAMIDOL (ISOVUE-300) INJECTION 61% COMPARISON:  CT 08/30/2017, 06/07/2017, PET CT  10/15/2016 FINDINGS: Lower chest: Lung bases again demonstrate multiple pulmonary nodules. Left lower lobe lung mass measures 2.6 x 2.8 cm, compared with 2.4 x 2.8 cm previously. Multiple additional small lung nodules in the lung bases are again noted. No pleural effusion. The heart size is normal. Hepatobiliary: Cirrhotic liver. Heterogenous hypodensity at the junction of the right and left lobes without well defined mass. Appearance is change from 08/30/2017. Calcified gallstone. No biliary dilatation. Pancreas: Unremarkable. No pancreatic ductal dilatation or surrounding inflammatory changes. Spleen: Enlarged, measuring 16 cm. Adrenals/Urinary Tract: Right adrenal gland and kidney are normal. Status post removal of left adrenal gland and left kidney. Bladder is slightly thick walled. Stomach/Bowel: The stomach is nonenlarged. No dilated small bowel. No colon wall thickening. Negative appendix. Vascular/Lymphatic: Nonaneurysmal aorta. Moderate aortic atherosclerosis. No significant adenopathy. Multiple upper abdominal varices. Reproductive: Slightly enlarged prostate Other: Moderate to large volume of ascites in the abdomen. Ventral hernia containing slightly loculated fluid measuring 9.5 x 2.1 cm, appears decreased compared to most recent CT. Prominent vessels are noted in the hernia. Large left inguinal fluid collection in addition to large left and small right hydrocele. Musculoskeletal: Degenerative changes. No acute or suspicious abnormality. IMPRESSION: 1. Periumbilical ventral hernia containing slightly loculated fluid collection measuring approximately 9.5 x 2.7 cm, slight lead decreased as compared with the most recent CT. Prominent enhancing vessels are noted in the hernia. 2. Cirrhosis of the liver with portal hypertension, splenomegaly and varices. Moderate to large volume of ascites within the abdomen and pelvis. 3. New heterogenous hypoenhancement within the liver, possible mass versus altered  perfusion dynamics. 4. Essentially stable pulmonary nodules within the lower lobes. 5. Status post left nephrectomy and removal of left adrenal gland 6. Gallstones 7. Large left inguinal fluid collection and large left hydrocele with small right hydrocele Electronically Signed   By: Donavan Foil M.D.   On: 09/13/2017 23:20   [place] Ekg Site Rite  Result Date: 09/15/2017 If Site Rite image not attached, placement could not be confirmed due to current cardiac rhythm.    Medications:   . sodium chloride 40 mL/hr at 09/15/17 0859  . meropenem (MERREM) IV    . norepinephrine (LEVOPHED) Adult infusion    . vancomycin     . allopurinol  100 mg Oral Daily  . aspirin  81 mg Oral Daily  . docusate sodium  100 mg Oral BID  . feeding supplement (ENSURE ENLIVE)  237 mL Oral TID BM  . fluticasone  1 spray Each Nare Daily  . folic acid  1 mg Oral Daily  . gabapentin  200 mg Oral BID  . heparin  5,000 Units Subcutaneous Q8H  . hydrocortisone sod succinate (SOLU-CORTEF) inj  100 mg Intravenous Q8H  . Influenza vac split quadrivalent PF  0.5 mL Intramuscular Tomorrow-1000  . insulin aspart  0-5 Units Subcutaneous QHS  . insulin aspart  0-9 Units Subcutaneous TID WC  . insulin detemir  25 Units Subcutaneous Q2200  . LORazepam  0-4 mg Oral Q6H   Followed by  . [START ON 09/16/2017]  LORazepam  0-4 mg Oral Q12H  . multivitamin with minerals  1 tablet Oral Daily  . sodium chloride flush  3 mL Intravenous Q12H  . thiamine  100 mg Oral Daily   Or  . thiamine  100 mg Intravenous Daily   acetaminophen **OR** acetaminophen, ALPRAZolam, bisacodyl, HYDROcodone-acetaminophen, LORazepam **OR** LORazepam, ondansetron **OR** ondansetron (ZOFRAN) IV  Assessment/ Plan:  62 y.o. male with past medical history of BPH, chronic kidney disease stage III baseline creatinine 1.5, metastatic renal cell carcinoma status post left nephrectomy 05/2012, diabetes mellitus type 2, hypertension, admitted now with hyponatremia. Pt  drinking significant amounts of alcohol at home.   1.  Hyponatremia.  Serum sodium up to 130 with IV fluid hydration with 0.9 normal saline.  Continue 0.9 normal saline at current rate.  Continue to monitor serum sodium daily for now.  2.  ARF/Chronic kidney disease stage III/proteinuria.  Creatinine up to 1.87.  Suspect secondary to hypotension and drainage of ascites.  Continue IV fluid hydration for now.  No urgent indication for dialysis.  3.  Anemia of chronic kidney disease.  Hemoglobin was 10.9 yesterday.  Patient also thrombocytopenic.  Likely secondary to liver disease.       LOS: 1 Rodney Stewart 9/15/201911:44 AM

## 2017-09-15 NOTE — Progress Notes (Signed)
CC: Ascites leak Subjective: This patient with an ascites leak secondary to an umbilical hernia which is eroded through the skin as well as end-stage liver disease secondary to severe alcoholism.  He has a metastatic cancer as well.  He is profoundly malnourished.  He was transferred today to the ICU for low blood sugars low blood pressure and bradycardia.  He is seen in the ICU with family members present.  He has no abdominal pain.  And is feeling better than he was earlier today.  Objective: Vital signs in last 24 hours: Temp:  [97.4 F (36.3 C)-98.9 F (37.2 C)] 97.9 F (36.6 C) (09/15 0811) Pulse Rate:  [63-78] 63 (09/15 0830) Resp:  [17-20] 19 (09/15 0811) BP: (67-108)/(44-71) 80/53 (09/15 0830) SpO2:  [91 %-100 %] 97 % (09/15 0811) Weight:  [66.3 kg-66.4 kg] 66.4 kg (09/15 0811) Last BM Date: 09/14/17  Intake/Output from previous day: 09/14 0701 - 09/15 0700 In: 1425.8 [P.O.:846; I.V.:579.8] Out: 3250 [Urine:1450] Intake/Output this shift: No intake/output data recorded.  Physical exam:  Vital signs reviewed. Abdomen is soft slightly distended non-erythematous umbilical hernia with minimal clear ascites fluid leaking into a ostomy bag.  The previously ordered abdominal binder is not in place.  It was in a bag on the side table.  Lab Results: CBC  Recent Labs    09/13/17 2144 09/14/17 0443  WBC 3.3* 3.5*  HGB 12.0* 10.9*  HCT 35.9* 32.5*  PLT 137* 113*   BMET Recent Labs    09/14/17 0443 09/15/17 0506  NA 122* 130*  K 4.5 4.9  CL 92* 96*  CO2 26 26  GLUCOSE 110* 59*  BUN 16 20  CREATININE 1.24 1.87*  CALCIUM 8.0* 8.2*   PT/INR Recent Labs    09/13/17 2144  LABPROT 14.6  INR 1.15   ABG No results for input(s): PHART, HCO3 in the last 72 hours.  Invalid input(s): PCO2, PO2  Studies/Results: Ct Abdomen Pelvis W Contrast  Result Date: 09/13/2017 CLINICAL DATA:  Abdominal hernia with liver failure EXAM: CT ABDOMEN AND PELVIS WITH CONTRAST  TECHNIQUE: Multidetector CT imaging of the abdomen and pelvis was performed using the standard protocol following bolus administration of intravenous contrast. CONTRAST:  124mL ISOVUE-300 IOPAMIDOL (ISOVUE-300) INJECTION 61% COMPARISON:  CT 08/30/2017, 06/07/2017, PET CT 10/15/2016 FINDINGS: Lower chest: Lung bases again demonstrate multiple pulmonary nodules. Left lower lobe lung mass measures 2.6 x 2.8 cm, compared with 2.4 x 2.8 cm previously. Multiple additional small lung nodules in the lung bases are again noted. No pleural effusion. The heart size is normal. Hepatobiliary: Cirrhotic liver. Heterogenous hypodensity at the junction of the right and left lobes without well defined mass. Appearance is change from 08/30/2017. Calcified gallstone. No biliary dilatation. Pancreas: Unremarkable. No pancreatic ductal dilatation or surrounding inflammatory changes. Spleen: Enlarged, measuring 16 cm. Adrenals/Urinary Tract: Right adrenal gland and kidney are normal. Status post removal of left adrenal gland and left kidney. Bladder is slightly thick walled. Stomach/Bowel: The stomach is nonenlarged. No dilated small bowel. No colon wall thickening. Negative appendix. Vascular/Lymphatic: Nonaneurysmal aorta. Moderate aortic atherosclerosis. No significant adenopathy. Multiple upper abdominal varices. Reproductive: Slightly enlarged prostate Other: Moderate to large volume of ascites in the abdomen. Ventral hernia containing slightly loculated fluid measuring 9.5 x 2.1 cm, appears decreased compared to most recent CT. Prominent vessels are noted in the hernia. Large left inguinal fluid collection in addition to large left and small right hydrocele. Musculoskeletal: Degenerative changes. No acute or suspicious abnormality. IMPRESSION: 1. Periumbilical ventral  hernia containing slightly loculated fluid collection measuring approximately 9.5 x 2.7 cm, slight lead decreased as compared with the most recent CT. Prominent  enhancing vessels are noted in the hernia. 2. Cirrhosis of the liver with portal hypertension, splenomegaly and varices. Moderate to large volume of ascites within the abdomen and pelvis. 3. New heterogenous hypoenhancement within the liver, possible mass versus altered perfusion dynamics. 4. Essentially stable pulmonary nodules within the lower lobes. 5. Status post left nephrectomy and removal of left adrenal gland 6. Gallstones 7. Large left inguinal fluid collection and large left hydrocele with small right hydrocele Electronically Signed   By: Donavan Foil M.D.   On: 09/13/2017 23:20    Anti-infectives: Anti-infectives (From admission, onward)   Start     Dose/Rate Route Frequency Ordered Stop   09/15/17 1030  vancomycin (VANCOCIN) IVPB 1000 mg/200 mL premix     1,000 mg 200 mL/hr over 60 Minutes Intravenous  Once 09/15/17 1002     09/15/17 1000  meropenem (MERREM) 1 g in sodium chloride 0.9 % 100 mL IVPB     1 g 200 mL/hr over 30 Minutes Intravenous Every 12 hours 09/15/17 0813        Assessment/Plan:  Labs reviewed.  Electrolyte abnormalities persist and creatinine is now elevated as well.  Thrombocytopenic noted yesterday.  Discussed again with patient and family the rationale for medical management and decreasing the volume and pressure of ascites while utilizing an abdominal binder in hopes that this will close on its own or if it requires surgery that he is medically optimized.  Currently he is a severe risk for surgical management in this patient because of his thrombocytopenia profound electrolyte abnormalities and now increasing creatinine.  Not plan any surgical intervention at this time unless this fails to close in the face of optimized medical management  Florene Glen, MD, FACS  09/15/2017

## 2017-09-15 NOTE — Progress Notes (Signed)
[place] [facility] [place] NAME: Rodney Stewart    MR#:  [id number]  DATE OF BIRTH:  1955-07-11  SUBJECTIVE:  Patient with nausea and vomiting last night Large amount of drainage from ascites. Blood pressure was low and patient was transferred to intensive care this morning for further evaluation and management.  Patient currently receiving IV fluids and IV albumin. States he feels better than he did this morning.  Denies nausea or vomiting.  Mental status is at baseline.  Family is at bedside. Denies abdominal pain, dizziness or lightheadedness.  REVIEW OF SYSTEMS:    Review of Systems  Constitutional: Negative for fever, chills weight loss HENT: Negative for ear pain, nosebleeds, congestion, facial swelling, rhinorrhea, neck pain, neck stiffness and ear discharge.   Respiratory: Negative for cough, shortness of breath, wheezing  Cardiovascular: Negative for chest pain, palpitations and leg swelling.  Gastrointestinal: Negative for heartburn, abdominal pain, vomiting, diarrhea or consitpation Genitourinary: Negative for dysuria, urgency, frequency, hematuria Musculoskeletal: Negative for back pain or joint pain Neurological: Negative for dizziness, seizures, syncope, focal weakness,  numbness and headaches.  Hematological: Does not bruise/bleed easily.  Psychiatric/Behavioral: Negative for hallucinations, confusion, dysphoric mood    Tolerating Diet: yes      DRUG ALLERGIES:  No Known Allergies  VITALS:  Blood pressure (!) 88/54, pulse 69, temperature 97.9 F (36.6 C), temperature source Oral, resp. rate 20, height 5\' 11"  (1.803 m), weight 66.4 kg, SpO2 100 %.  PHYSICAL EXAMINATION:  Constitutional: Appears well-developed and well-nourished. No distress. HENT: Normocephalic. Marland Kitchen Oropharynx is clear and moist.  Eyes: Conjunctivae and EOM are normal. PERRLA, no scleral icterus.  Neck: Normal ROM. Neck supple. No JVD. No tracheal  deviation. CVS: RRR, S1/S2 +, no murmurs, no gallops, no carotid bruit.  Pulmonary: Effort and breath sounds normal, no stridor, rhonchi, wheezes, rales.  Abdominal: Distended abdomen with ostomy bag on umbilicus for ascites drainage  musculoskeletal: Normal range of motion. No edema and no tenderness.  Neuro: Alert. CN 2-12 grossly intact. No focal deficits. Skin: Skin is warm and dry. No rash noted. Psychiatric: Normal mood and affect.      LABORATORY PANEL:   CBC Recent Labs  Lab 09/14/17 0443  WBC 3.5*  HGB 10.9*  HCT 32.5*  PLT 113*   ------------------------------------------------------------------------------------------------------------------  Chemistries  Recent Labs  Lab 09/13/17 2144  09/15/17 0506  NA 117*   < > 130*  K 5.2*   < > 4.9  CL 85*   < > 96*  CO2 25   < > 26  GLUCOSE 243*   < > 59*  BUN 19   < > 20  CREATININE 1.35*   < > 1.87*  CALCIUM 8.2*   < > 8.2*  AST 52*  --   --   ALT 27  --   --   ALKPHOS 444*  --   --   BILITOT 1.7*  --   --    < > = values in this interval not displayed.   ------------------------------------------------------------------------------------------------------------------  Cardiac Enzymes No results for input(s): TROPONINI in the last 168 hours. ------------------------------------------------------------------------------------------------------------------  RADIOLOGY:  Ct Abdomen Pelvis W Contrast  Result Date: 09/13/2017 CLINICAL DATA:  Abdominal hernia with liver failure EXAM: CT ABDOMEN AND PELVIS WITH CONTRAST TECHNIQUE: Multidetector CT imaging of the abdomen and pelvis was performed using the standard protocol following bolus administration of intravenous contrast. CONTRAST:  113mL ISOVUE-300 IOPAMIDOL (ISOVUE-300) INJECTION 61% COMPARISON:  CT 08/30/2017, 06/07/2017, PET  CT 10/15/2016 FINDINGS: Lower chest: Lung bases again demonstrate multiple pulmonary nodules. Left lower lobe lung mass measures 2.6 x  2.8 cm, compared with 2.4 x 2.8 cm previously. Multiple additional small lung nodules in the lung bases are again noted. No pleural effusion. The heart size is normal. Hepatobiliary: Cirrhotic liver. Heterogenous hypodensity at the junction of the right and left lobes without well defined mass. Appearance is change from 08/30/2017. Calcified gallstone. No biliary dilatation. Pancreas: Unremarkable. No pancreatic ductal dilatation or surrounding inflammatory changes. Spleen: Enlarged, measuring 16 cm. Adrenals/Urinary Tract: Right adrenal gland and kidney are normal. Status post removal of left adrenal gland and left kidney. Bladder is slightly thick walled. Stomach/Bowel: The stomach is nonenlarged. No dilated small bowel. No colon wall thickening. Negative appendix. Vascular/Lymphatic: Nonaneurysmal aorta. Moderate aortic atherosclerosis. No significant adenopathy. Multiple upper abdominal varices. Reproductive: Slightly enlarged prostate Other: Moderate to large volume of ascites in the abdomen. Ventral hernia containing slightly loculated fluid measuring 9.5 x 2.1 cm, appears decreased compared to most recent CT. Prominent vessels are noted in the hernia. Large left inguinal fluid collection in addition to large left and small right hydrocele. Musculoskeletal: Degenerative changes. No acute or suspicious abnormality. IMPRESSION: 1. Periumbilical ventral hernia containing slightly loculated fluid collection measuring approximately 9.5 x 2.7 cm, slight lead decreased as compared with the most recent CT. Prominent enhancing vessels are noted in the hernia. 2. Cirrhosis of the liver with portal hypertension, splenomegaly and varices. Moderate to large volume of ascites within the abdomen and pelvis. 3. New heterogenous hypoenhancement within the liver, possible mass versus altered perfusion dynamics. 4. Essentially stable pulmonary nodules within the lower lobes. 5. Status post left nephrectomy and removal of left  adrenal gland 6. Gallstones 7. Large left inguinal fluid collection and large left hydrocele with small right hydrocele Electronically Signed   By: Donavan Foil M.D.   On: 09/13/2017 23:20   [place] Ekg Site Rite  Result Date: 09/15/2017 If Site Rite image not attached, placement could not be confirmed due to current cardiac rhythm.    ASSESSMENT AND PLAN:    62 year old male with stage IV renal cell carcinoma with lung mets, chronic cirrhosis due to EtOH, diabetes and hypertension who presented to the ER with drainage from abdominal wall.  1.  Hypotension due to large volume drainage from ascites fluid Continue IV fluids with albumin Patient may require pressors Intensivist consult appreciated 2.  Hyperglycemia/diabetes:  Blood sugars have improved with 1 amp of D50 Continue sliding scale  3.  Hyponatremia from liver cirrhosis and dehydration Sodium has improved  Nephrology consultation appreciated  4.  Acute kidney injury: Creatinine has worsened this morning likely due to this a.m.'s events BMP for a.m.  5.  Stage IV renal cell carcinoma with metastatic disease: Patient is status post left nephrectomy Continue palliative chemotherapy  6.  EtOH related cirrhosis and ascites: Patient has high surgical risk secondary to multiple comorbidities Possible paracentesis on Monday  D/w dr conforti  Management plans discussed with the patient and he is in agreement.  CODE STATUS: full  Critical careTOTAL TIME TAKING CARE OF THIS PATIENT: 30 minutes.     POSSIBLE D/C 2-4 days, DEPENDING ON CLINICAL CONDITION.   Laressa Bolinger M.D on 09/15/2017 at 10:33 AM  Between 7am to 6pm - Pager - [phone] After 6pm go to [email or web address] - password [email or web address] [facility]  Office  [phone]  CC: Primary care physician; [name], [name], MD  Note: This dictation was  prepared with Dragon dictation along with smaller phrase technology. Any transcriptional errors  that result from this process are unintentional.

## 2017-09-15 NOTE — Progress Notes (Signed)
Notified Maggie, NP that patient continues to c/o nausea. Last dose of IV Zofran given 1817. Nausea has not improved. New verbal order obtained for Zofran IV 4mg  once now.

## 2017-09-15 NOTE — Progress Notes (Signed)
Patient to ICU in am from 1C alert but sleeping between care, oriented.  IV fluid bolus completed, albumin given, pt BP remained soft, started levophed per parameters with improvement. Pt denies urge to void, bladder scanned with 566ml, patient refusing I&O and indwelling cath, pt requested to "give time" to void on own. Will communicate to oncoming shift to rescan bladder and follow up. Family at bedside, attentive to patient needs. Patient resting quietly between care, will continue to monitor.

## 2017-09-15 NOTE — Progress Notes (Signed)
Gave report to Harleigh, Therapist, sports.

## 2017-09-16 ENCOUNTER — Inpatient Hospital Stay: Payer: Medicare HMO

## 2017-09-16 DIAGNOSIS — Z66 Do not resuscitate: Secondary | ICD-10-CM

## 2017-09-16 DIAGNOSIS — E871 Hypo-osmolality and hyponatremia: Secondary | ICD-10-CM

## 2017-09-16 DIAGNOSIS — K7011 Alcoholic hepatitis with ascites: Secondary | ICD-10-CM

## 2017-09-16 DIAGNOSIS — E43 Unspecified severe protein-calorie malnutrition: Secondary | ICD-10-CM

## 2017-09-16 DIAGNOSIS — Z7189 Other specified counseling: Secondary | ICD-10-CM

## 2017-09-16 DIAGNOSIS — Z515 Encounter for palliative care: Secondary | ICD-10-CM

## 2017-09-16 LAB — COMPREHENSIVE METABOLIC PANEL
ALBUMIN: 2.3 g/dL — AB (ref 3.5–5.0)
ALK PHOS: 212 U/L — AB (ref 38–126)
ALT: 14 U/L (ref 0–44)
ANION GAP: 6 (ref 5–15)
AST: 26 U/L (ref 15–41)
BILIRUBIN TOTAL: 3.2 mg/dL — AB (ref 0.3–1.2)
BUN: 33 mg/dL — AB (ref 8–23)
CALCIUM: 7.5 mg/dL — AB (ref 8.9–10.3)
CO2: 25 mmol/L (ref 22–32)
Chloride: 95 mmol/L — ABNORMAL LOW (ref 98–111)
Creatinine, Ser: 1.59 mg/dL — ABNORMAL HIGH (ref 0.61–1.24)
GFR calc Af Amer: 52 mL/min — ABNORMAL LOW (ref >60)
GFR calc non Af Amer: 45 mL/min — ABNORMAL LOW (ref >60)
GLUCOSE: 243 mg/dL — AB (ref 70–99)
Potassium: 5.5 mmol/L — ABNORMAL HIGH (ref 3.5–5.1)
Sodium: 126 mmol/L — ABNORMAL LOW (ref 135–145)
TOTAL PROTEIN: 5.3 g/dL — AB (ref 6.5–8.1)

## 2017-09-16 LAB — CBC
HEMATOCRIT: 34.3 % — AB (ref 40.0–52.0)
HEMOGLOBIN: 11.4 g/dL — AB (ref 13.0–18.0)
MCH: 28 pg (ref 26.0–34.0)
MCHC: 33.1 g/dL (ref 32.0–36.0)
MCV: 84.6 fL (ref 80.0–100.0)
Platelets: 106 10*3/uL — ABNORMAL LOW (ref 150–440)
RBC: 4.06 MIL/uL — ABNORMAL LOW (ref 4.40–5.90)
RDW: 16.6 % — AB (ref 11.5–14.5)
WBC: 11.9 10*3/uL — ABNORMAL HIGH (ref 3.8–10.6)

## 2017-09-16 LAB — PHOSPHORUS: PHOSPHORUS: 4.6 mg/dL (ref 2.5–4.6)

## 2017-09-16 LAB — GLUCOSE, CAPILLARY
GLUCOSE-CAPILLARY: 298 mg/dL — AB (ref 70–99)
Glucose-Capillary: 40 mg/dL — CL (ref 70–99)
Glucose-Capillary: 212 mg/dL — ABNORMAL HIGH (ref 70–99)
Glucose-Capillary: 250 mg/dL — ABNORMAL HIGH (ref 70–99)
Glucose-Capillary: 430 mg/dL — ABNORMAL HIGH (ref 70–99)
Glucose-Capillary: 443 mg/dL — ABNORMAL HIGH (ref 70–99)

## 2017-09-16 LAB — MAGNESIUM: Magnesium: 1.8 mg/dL (ref 1.7–2.4)

## 2017-09-16 MED ORDER — INSULIN ASPART 100 UNIT/ML ~~LOC~~ SOLN
15.0000 [IU] | Freq: Once | SUBCUTANEOUS | Status: AC
  Administered 2017-09-16: 15 [IU] via SUBCUTANEOUS
  Filled 2017-09-16: qty 1

## 2017-09-16 MED ORDER — INSULIN ASPART 100 UNIT/ML ~~LOC~~ SOLN
9.0000 [IU] | Freq: Once | SUBCUTANEOUS | Status: AC
  Administered 2017-09-16: 9 [IU] via SUBCUTANEOUS
  Filled 2017-09-16: qty 1

## 2017-09-16 MED ORDER — HYDROCORTISONE NA SUCCINATE PF 100 MG IJ SOLR
50.0000 mg | Freq: Three times a day (TID) | INTRAMUSCULAR | Status: DC
  Administered 2017-09-17 (×2): 50 mg via INTRAVENOUS
  Filled 2017-09-16 (×2): qty 2

## 2017-09-16 MED ORDER — ALBUMIN HUMAN 25 % IV SOLN
25.0000 g | Freq: Once | INTRAVENOUS | Status: AC
  Administered 2017-09-16: 25 g via INTRAVENOUS
  Filled 2017-09-16: qty 100

## 2017-09-16 MED ORDER — INSULIN ASPART 100 UNIT/ML ~~LOC~~ SOLN
0.0000 [IU] | Freq: Three times a day (TID) | SUBCUTANEOUS | Status: DC
  Administered 2017-09-17 (×2): 3 [IU] via SUBCUTANEOUS
  Administered 2017-09-17: 5 [IU] via SUBCUTANEOUS
  Filled 2017-09-16 (×3): qty 1

## 2017-09-16 MED ORDER — INSULIN ASPART 100 UNIT/ML ~~LOC~~ SOLN
0.0000 [IU] | Freq: Every day | SUBCUTANEOUS | Status: DC
  Administered 2017-09-17: 3 [IU] via SUBCUTANEOUS
  Filled 2017-09-16: qty 1

## 2017-09-16 NOTE — Progress Notes (Signed)
[facility]  ROUNDING NOTE   Subjective:   Brother and daughter at bedside.   Norepinephrine 13mcg  UOP 425  Na 126 (130)  NS at 29mL/hr   Objective:  Vital signs in last 24 hours:  Temp:  [97.7 F (36.5 C)-99.2 F (37.3 C)] 97.7 F (36.5 C) (09/16 0101) Pulse Rate:  [63-77] 77 (09/16 0630) Resp:  [10-20] 12 (09/16 0530) BP: (75-115)/(52-72) 105/67 (09/16 0630) SpO2:  [96 %-100 %] 97 % (09/16 0630) Weight:  [71.5 kg] 71.5 kg (09/16 0656)  Weight change: 0.1 kg Filed Weights   09/15/17 0500 09/15/17 0811 09/16/17 0656  Weight: 66.3 kg 66.4 kg 71.5 kg    Intake/Output: I/O last 3 completed shifts: In: 2290 [I.V.:1740; IV Piggyback:550] Out: 2075 [Urine:425; Other:1650]   Intake/Output this shift:  No intake/output data recorded.  Physical Exam: General: No acute distress  Head: Normocephalic, atraumatic. Moist oral mucosal membranes  Eyes: Anicteric  Neck: Supple, trachea midline  Lungs:  Clear to auscultation, normal effort  Heart: S1S2 no rubs  Abdomen:  +ostomy, abdominal binder  Extremities: no peripheral edema.  Neurologic: Awake, alert, following commands  Skin: No lesions       Basic Metabolic Panel: Recent Labs  Lab 09/11/17 1031 09/13/17 2144 09/14/17 0443 09/15/17 0506 09/16/17 0532 09/16/17 0537  NA 119* 117* 122* 130*  --  126*  K 5.2* 5.2* 4.5 4.9  --  5.5*  CL 85* 85* 92* 96*  --  95*  CO2 25 25 26 26   --  25  GLUCOSE 69* 243* 110* 59*  --  243*  BUN 19 19 16 20   --  33*  CREATININE 1.20 1.35* 1.24 1.87*  --  1.59*  CALCIUM 9.0 8.2* 8.0* 8.2*  --  7.5*  MG  --   --   --   --  1.8  --   PHOS  --   --   --   --  4.6  --     Liver Function Tests: Recent Labs  Lab 09/11/17 1031 09/13/17 2144 09/16/17 0537  AST 51* 52* 26  ALT 23 27 14   ALKPHOS 417* 444* 212*  BILITOT 1.9* 1.7* 3.2*  PROT 7.2 6.6 5.3*  ALBUMIN 3.3* 3.0* 2.3*   No results for input(s): LIPASE, AMYLASE in the last 168 hours. No results for  input(s): AMMONIA in the last 168 hours.  CBC: Recent Labs  Lab 09/11/17 1031 09/13/17 2144 09/14/17 0443 09/16/17 0532  WBC 3.9 3.3* 3.5* 11.9*  NEUTROABS 2.4 2.2  --   --   HGB 11.9* 12.0* 10.9* 11.4*  HCT 35.2* 35.9* 32.5* 34.3*  MCV 82.0 83.9 83.7 84.6  PLT 159 137* 113* 106*    Cardiac Enzymes: No results for input(s): CKTOTAL, CKMB, CKMBINDEX, TROPONINI in the last 168 hours.  BNP: Invalid input(s): POCBNP  CBG: Recent Labs  Lab 09/15/17 1139 09/15/17 1501 09/15/17 1547 09/15/17 2221 09/16/17 0737  GLUCAP 99 169* 190* 206* 212*    Microbiology: Results for orders placed or performed during the hospital encounter of 09/13/17  MRSA PCR Screening     Status: None   Collection Time: 09/15/17  8:17 AM  Result Value Ref Range Status   MRSA by PCR NEGATIVE NEGATIVE Final    Comment:        The GeneXpert MRSA Assay (FDA approved for NASAL specimens only), is one component of a comprehensive MRSA colonization surveillance program. It is not intended to diagnose MRSA infection nor to  guide or monitor treatment for MRSA infections. Performed at [facility], [address] [place], [place] [address]     Coagulation Studies: Recent Labs    09/13/17 March 30, 2142  LABPROT 14.6  INR 1.15    Urinalysis: No results for input(s): COLORURINE, LABSPEC, PHURINE, GLUCOSEU, HGBUR, BILIRUBINUR, KETONESUR, PROTEINUR, UROBILINOGEN, NITRITE, LEUKOCYTESUR in the last 72 hours.  Invalid input(s): APPERANCEUR    Imaging: [place] Ekg Site Rite  Result Date: 09/15/2017 If Site Rite image not attached, placement could not be confirmed due to current cardiac rhythm.    Medications:   . sodium chloride 5 mL/hr at 09/16/17 0109  . albumin human    . meropenem (MERREM) IV Stopped (09/15/17 2254)  . norepinephrine (LEVOPHED) Adult infusion 3 mcg/min (09/16/17 3235)  . vancomycin Stopped (09/15/17 03-29-2128)   . allopurinol  100 mg Oral Daily  . aspirin  81 mg Oral Daily   . docusate sodium  100 mg Oral BID  . feeding supplement (ENSURE ENLIVE)  237 mL Oral TID BM  . fluticasone  1 spray Each Nare Daily  . folic acid  1 mg Oral Daily  . gabapentin  200 mg Oral BID  . heparin  5,000 Units Subcutaneous Q8H  . hydrocortisone sod succinate (SOLU-CORTEF) inj  100 mg Intravenous Q8H  . Influenza vac split quadrivalent PF  0.5 mL Intramuscular Tomorrow-1000  . insulin aspart  0-5 Units Subcutaneous QHS  . insulin aspart  0-9 Units Subcutaneous TID WC  . insulin detemir  25 Units Subcutaneous Q2200  . magic mouthwash  5 mL Oral QID   And  . lidocaine  5 mL Mouth/Throat QID  . LORazepam  0-4 mg Oral Q12H  . multivitamin with minerals  1 tablet Oral Daily  . sodium chloride flush  3 mL Intravenous Q12H  . tamsulosin  0.4 mg Oral QODAY  . thiamine  100 mg Oral Daily   Or  . thiamine  100 mg Intravenous Daily   sodium chloride, acetaminophen **OR** acetaminophen, ALPRAZolam, alum & mag hydroxide-simeth, bisacodyl, fentaNYL (SUBLIMAZE) injection, HYDROcodone-acetaminophen, LORazepam **OR** LORazepam, ondansetron **OR** ondansetron (ZOFRAN) IV  Assessment/ Plan:  62 y.o.white male with  BPH, chronic kidney disease stage III baseline creatinine 1.5, metastatic renal cell carcinoma status post left nephrectomy 05/2012, diabetes mellitus type 2, hypertension, EtoH, admitted now with hyponatremia.    1.  Hyponatremia: initially with hypovolemia and improved with Normal saline infusion. However now trending back down.  - Discontinue IV fluids - Consider restarting furosemide and spironolactone when more hemodynamically stable.   2.  Acute renal failure on chronic kidney disease stage III with proteinuria: baseline Creatinine of 1.5 Acute renal failure from prerenal azotemia and hepatorenal syndrome Chronic kidney disease secondary to solitary kidney, diabetes and liver failure, alcohol abuse.  - Creatinine improving.   3.  Anemia of chronic kidney disease with  thrombocytopenia   LOS: 2 Rodney Stewart 9/16/20199:47 AM

## 2017-09-16 NOTE — Progress Notes (Signed)
Patient went and had a successful paracentesis where they removed 2L of ascites fluid. Patient tolerated well. No complaints of pain at this time.

## 2017-09-16 NOTE — Progress Notes (Signed)
[facility] NAME: Rodney Stewart    MR#:  [id number]  DATE OF BIRTH:  02/08/1955  SUBJECTIVE:  Family at bedside.  Patient is on levo fed for low blood pressure.  Patient denies dizziness or chest pain.  No output from ascites fluid bag Rated breakfast this morning  REVIEW OF SYSTEMS:    Review of Systems  Constitutional: Negative for fever, chills weight loss HENT: Negative for ear pain, nosebleeds, congestion, facial swelling, rhinorrhea, neck pain, neck stiffness and ear discharge.   Respiratory: Negative for cough, shortness of breath, wheezing  Cardiovascular: Negative for chest pain, palpitations and leg swelling.  Gastrointestinal: Negative for heartburn, abdominal pain, vomiting, diarrhea or consitpation Genitourinary: Negative for dysuria, urgency, frequency, hematuria Musculoskeletal: Negative for back pain or joint pain Neurological: Negative for dizziness, seizures, syncope, focal weakness,  numbness and headaches.  Hematological: Does not bruise/bleed easily.  Psychiatric/Behavioral: Negative for hallucinations, confusion, dysphoric mood    Tolerating Diet: yes      DRUG ALLERGIES:  No Known Allergies  VITALS:  Blood pressure 105/67, pulse 77, temperature 97.7 F (36.5 C), temperature source Axillary, resp. rate 12, height 5\' 11"  (1.803 m), weight 71.5 kg, SpO2 97 %.  PHYSICAL EXAMINATION:  Constitutional: Appears well-developed and well-nourished. No distress. HENT: Normocephalic. Marland Kitchen Oropharynx is clear and moist.  Eyes: Conjunctivae and EOM are normal. PERRLA, no scleral icterus.  Neck: Normal ROM. Neck supple. No JVD. No tracheal deviation. CVS: RRR, S1/S2 +, no murmurs, no gallops, no carotid bruit.  Pulmonary: Effort and breath sounds normal, no stridor, rhonchi, wheezes, rales.  Abdominal: Distended abdomen with ostomy bag on umbilicus for ascites drainage  musculoskeletal: Normal range of motion. No edema  and no tenderness.  Neuro: Alert. CN 2-12 grossly intact. No focal deficits. Skin: Skin is warm and dry. No rash noted. Psychiatric: Normal mood and affect.      LABORATORY PANEL:   CBC Recent Labs  Lab 09/16/17 0532  WBC 11.9*  HGB 11.4*  HCT 34.3*  PLT 106*   ------------------------------------------------------------------------------------------------------------------  Chemistries  Recent Labs  Lab 09/16/17 0532 09/16/17 0537  NA  --  126*  K  --  5.5*  CL  --  95*  CO2  --  25  GLUCOSE  --  243*  BUN  --  33*  CREATININE  --  1.59*  CALCIUM  --  7.5*  MG 1.8  --   AST  --  26  ALT  --  14  ALKPHOS  --  212*  BILITOT  --  3.2*   ------------------------------------------------------------------------------------------------------------------  Cardiac Enzymes No results for input(s): TROPONINI in the last 168 hours. ------------------------------------------------------------------------------------------------------------------  RADIOLOGY:  [place] Ekg Site Rite  Result Date: 09/15/2017 If Site Rite image not attached, placement could not be confirmed due to current cardiac rhythm.    ASSESSMENT AND PLAN:    62 year old male with stage IV renal cell carcinoma with lung mets, chronic cirrhosis due to EtOH, diabetes and hypertension who presented to the ER with drainage from abdominal wall.  1.  Hypovolemic shock due to large volume drainage from ascites fluid Continue IV fluids with albumin Continue pressors Intensivist consult appreciated  2.  Hyperglycemia/diabetes:  Blood sugars have improved with 1 amp of D50 Continue sliding scale  3.  Hyponatremia from liver cirrhosis and dehydration Sodium this morning Management as per nephrology.  4.  Acute kidney injury: Creatinine improving  5.  Stage IV renal cell carcinoma  with metastatic disease: Patient is status post left nephrectomy Continue palliative chemotherapy  6.  EtOH related  cirrhosis and ascites: Patient has high surgical risk secondary to multiple comorbidities In for paracentesis they 7  Hyperkalemia: This needs to be treated with Kayexalate, dextrose and insulin  This with family at bedside Patient with overall poor prognosis and would benefit from palliative care consult which has been placed  Management plans discussed with the patient and he is in agreement.  CODE STATUS: full  Critical careTOTAL TIME TAKING CARE OF THIS PATIENT: 32 minutes.     POSSIBLE D/C 2-4 days, DEPENDING ON CLINICAL CONDITION.   Remo Kirschenmann M.D on 09/16/2017 at 11:11 AM  Between 7am to 6pm - Pager - [phone] After 6pm go to [email or web address] - password [email or web address] [facility]  Office  [phone]  CC: Primary care physician; [name], [name], MD  Note: This dictation was prepared with Dragon dictation along with smaller phrase technology. Any transcriptional errors that result from this process are unintentional.

## 2017-09-16 NOTE — Progress Notes (Signed)
   09/16/17 1055  Clinical Encounter Type  Visited With Patient and family together  Visit Type Follow-up   Chaplain followed up with patient and family; no needs for chaplain at present.  Patient did reference documents left by chaplain yesterday and spoke of his plans to complete them some time.  Chaplain encouraged patient to have chaplain paged if any questions regarding documentation arise or when ready to complete.

## 2017-09-16 NOTE — Progress Notes (Signed)
   CHIEF COMPLAINT:   Chief Complaint  Patient presents with  . hernia leaking  ascites  Subjective  +liver failure +renal failure +ascites  Alert and awake On vasopressors  Metastatic renal cell carcinoma Paracentesis is pedning      Objective   Examination:  General exam: Appears calm and comfortable  Respiratory system: Clear to auscultation. Respiratory effort normal. HEENT: [place]/AT, PERRLA, no thrush, no stridor. Cardiovascular system: S1 & S2 heard, RRR. No JVD, murmurs, rubs, gallops or clicks. No pedal edema. Gastrointestinal system: Abdomen is distended foley bag in place for ascites leak Central nervous system: Alert and oriented. No focal neurological deficits. Extremities: Symmetric 5 x 5 power. Skin: No rashes, lesions or ulcers Psychiatry: Judgement and insight appear normal. Mood & affect appropriate.   VITALS:  height is 5\' 11"  (1.803 m) and weight is 71.5 kg. His axillary temperature is 97.7 F (36.5 C). His blood pressure is 105/67 and his pulse is 77. His respiration is 12 and oxygen saturation is 97%.   I personally reviewed Labs under Results section.  Radiology Reports [place] Ekg Site Rite  Result Date: 09/15/2017 If [facility] image not attached, placement could not be confirmed due to current cardiac rhythm.      Assessment/Plan:  62 yo admitted to step down for hypotention from volume loss ascites with agitation and liver failure, renal failure with metastatic renal cell carcinoma  Overall prognosis is very poor  Wean off vasopressors Map goal >60 IVF's Follow up gen surgery recs Follow up nephrology recs Await paracentesis today Palliative care consulted     Tiajah Oyster Patricia Pesa, M.D.  [facility]  Medical Director [place] Director [facility] Cardio-Pulmonary Department

## 2017-09-16 NOTE — Consult Note (Signed)
Consultation Note Date: 09/16/2017   Patient Name: Rodney Stewart  DOB: 1955-04-06  MRN: [id number]  Age / Sex: 62 y.o., male  PCP: [name], [name], MD Referring Physician: Bettey Costa, MD  Reason for Consultation: Establishing goals of care  HPI/Patient Profile: 62 y.o. male  admitted on 09/13/2017 from home with complaints of abdominal wall drainage.  He has a past medical history significant for advanced metastatic renal cell carcinoma lung mets, (undergoing Opdivo tx with oncologist, Dr. Grayland Ormond), chronic liver failure/EtOH cirrhosis, diabetes type 2, BPH, stage III chronic kidney disease, GERD, hypertension, and umbilical hernia.  Patient presented to the ED with drainage from a wound on his abdomen all near his umbilical hernia.  Patient reported to admitting provider he started noticing a lot of yellow-colored clear liquid drainage for several hours.  Patient continues to drink beer regularly despite medical conditions.  During his ED course sodium was 117, creatinine 1.35, potassium 5.2, WBCs were within normal limits.  CT abdomen showed periumbilical hernia containing slightly loculated fluid collection, slightly decreased as compared to the previous CT, cirrhosis of the liver with portal hypertension, splenomegaly with varices, moderate to large volume of ascites was also seen with an abdomen and pelvis.  Since admission patient has been admitted to stepdown for hypotension.  He has been seen by general surgery, oncology, and nephrology.  He is S/P paracentesis which yielded 2 L.  Palliative medicine team consulted for goals of care discussion.  Clinical Assessment and Goals of Care: I have reviewed medical records including lab results, imaging, Epic notes, and MAR, received report from the bedside RN, and assessed the patient. I then met at the bedside with patient to discuss diagnosis prognosis, GOC,  EOL wishes, disposition and options.  Patient is alert and oriented x3.  He is able to engage in goals of care discussion appropriately.  I introduced Palliative Medicine as specialized medical care for people living with serious illness. It focuses on providing relief from the symptoms and stress of a serious illness. The goal is to improve quality of life for both the patient and the family.  We discussed a brief life review of the patient.  She reports he is divorced and has 2 daughters ages 74 and 38.  He states his older daughter lives in [place] and his youngest daughter lives here in [place] and is a big support.  He is a retired Publishing rights manager.   As far as functional and nutritional status she states prior to admission he was ambulatory and was able to perform all ADLs independently.  He states the week prior to admission he also was able to go outside and mow his yard and doing some outside house maintenance.  He does reports after receiving treatment he is somewhat tired and has increased fatigue.  Able to drive himself back and forth to the cancer center for his regular treatments.  He states he has a dog, Ollie who he is able to care for also.  Patient states his appetite has decreased  significantly and he knows that he is lost quite a bit of weight over time.  He states since he has started this treatment he can tell the difference in the alteration in his taste.  He reports nothing tastes the same or normal and he often finds himself eating cereal, oatmeal, fruits and vegetables, and anything with potatoes.  He states he does not eat much meat but drinks at least 2-3 protein shakes a day as well chemotherapy Gatorade zero.  We discussed his current illness and what it means in the larger context of his on-going co-morbidities.  Natural disease trajectory and expectations at EOL were discussed.  Patient verbalized understanding of his current illness.  He states that he knows that he will  eventually pass away from his cancer or his liver.  He remains hopeful for the best but is also prepared for the worse.  I attempted to elicit values and goals of care important to the patient.    The difference between aggressive medical intervention and comfort care was considered in light of the patient's goals of care.  Mr. Mccurdy states he would like to continue with aggressive medical interventions, such as chemotherapy and any required medical interventions.  He feels as though until his oncologist let him know that his current treatment is no longer working or he has shown signs of progression there is still a chance that he will continue on for some time.  He states once I am told this then I would then begin to prepare for what comes next.  Advanced directives, concepts specific to code status, artifical feeding and hydration, and rehospitalization were considered and discussed.  We discussed in details patient is advanced directives.  He states he has paperwork to complete his advance directives however this has not been done.  Offered to have chaplain services assist with his completion.  He states once they are completed he will notify nursing staff to have chaplain services available.  He does verbalize in the event he is unable to make medical decisions that his 2 daughters would be his medical power of attorney's.  Discussed in detail his current CODE STATUS.  We discussed what a potential code could look like the event of a cardiac or respiratory event and in relation to his current illness and ongoing comorbidities.  Patient verbalized understanding and states he would never want his children to have to make a decision such as this.  He states he was responsible for making a decision during his brother's major events and he knows how he felt during that time.  Patient states that he knows that he will not live forever and he knows the odds are against him.  At this time he has requested to be a  DNR/DNI.  Discussed in details that in the event of a cardiac or respiratory event patient will be allowed to have a natural death without her heroic or life-sustaining measures.  She verbalized understanding and again confirmed that this would be his wish.  Educated patient that nursing staff will place a purple DNR bracelet on him to notify all medical staff of his wishes as well as a completion of a gold out of facility DNR form that he may be able to take home and place in a known location for family and emergency personnel is in the community.  Patient verbalized understanding and appreciation.  She also verbalized that he would not be interested in any forms of artificial feedings such as a  PEG tube.  Hospice and Palliative Care services outpatient were explained and offered.  Given patient's goals for himself and the wish to continue with chemotherapy treatment and other medical management we discussed in detail palliative care services.  Patient has requested to have outpatient palliative care services at discharge.  Questions and concerns were addressed.The family was encouraged to call with questions or concerns.  PMT will continue to support holistically.  Mr. Formica is alert and oriented x3.  He is able to make his own medical decisions however in the event he is unable to make medical decisions he states that his daughters would be his healthcare power of attorney. [facility])-Primary  NEXT OF KIN    SUMMARY OF RECOMMENDATIONS    DNR/DNI-as requested by patient.  Continue to treat the treatable while hospitalized.  Patient has requested for outpatient palliative services to be involved once discharge.  Case management referral for outpatient hospice at discharge.  Palliative medicine team will continue to support patient, patient's family, and medical team during hospitalization as needed.  Code Status/Advance Care Planning:  DNR/DNI   Palliative Prophylaxis:   Aspiration,  Bowel Regimen, Frequent Pain Assessment and Palliative Wound Care  Additional Recommendations (Limitations, Scope, Preferences):  Full Scope Treatment  Psycho-social/Spiritual:   Desire for further Chaplaincy support:no  Prognosis:   Unable to determine-support in the setting of stage IV metastatic clear renal cell carcinoma with lung mets, EtOH cirrhosis, CKD stage III, medical hernia, severe protein calorie malnutrition, weight loss greater than 10%, hypertension, single kidney, diabetes, alcohol abuse, poor p.o. intake, and coronary artery disease.  Discharge Planning: To Be Determined outpatient palliative services     Primary Diagnoses: Present on Admission: . Acute hyponatremia   I have reviewed the medical record, interviewed the patient and family, and examined the patient. The following aspects are pertinent.  Past Medical History:  Diagnosis Date  . Anemia   . BPH (benign prostatic hyperplasia)   . CKD (chronic kidney disease) stage 3, GFR 30-59 ml/min (HCC)   . Clear cell renal cell carcinoma ([place]) 2014   Left Nephrectomy.  . Colon polyps   . Diabetes mellitus without complication ([place])    type 2   . Dyspnea    with exertion  . GERD (gastroesophageal reflux disease)   . History of gout   . History of nephrectomy    Left  . Hypertension   . Neuropathy   . Renal Cancer    Renal Cancer  . Renal cell carcinoma of left kidney (HCC)    mets to lungs  . Umbilical hernia    Social History   Socioeconomic History  . Marital status: Divorced    Spouse name: Not on file  . Number of children: Not on file  . Years of education: Not on file  . Highest education level: Not on file  Occupational History  . Not on file  Social Needs  . Financial resource strain: Not hard at all  . Food insecurity:    Worry: Never true    Inability: Never true  . Transportation needs:    Medical: No    Non-medical: No  Tobacco Use  . Smoking status: Former Smoker     Packs/day: 1.00    Types: Cigarettes    Last attempt to quit: 04/15/1993    Years since quitting: 24.4  . Smokeless tobacco: Never Used  Substance and Sexual Activity  . Alcohol use: Yes    Alcohol/week: 42.0 standard drinks  Types: 42 Cans of beer per week    Comment: 2-3 beers every other day  . Drug use: No  . Sexual activity: Yes  Lifestyle  . Physical activity:    Days per week: Patient refused    Minutes per session: Patient refused  . Stress: Patient refused  Relationships  . Social connections:    Talks on phone: Patient refused    Gets together: Patient refused    Attends religious service: Patient refused    Active member of club or organization: Patient refused    Attends meetings of clubs or organizations: Patient refused    Relationship status: Patient refused  Other Topics Concern  . Not on file  Social History Narrative   HS graduate    Retired    Divorced    2 daughters    Lives alone    [name] to travel to [employer]    1 living brother who is doctor in [place], wears seat belt    Family History  Problem Relation Age of Onset  . Ovarian cancer Mother   . Diabetes Father   . Hypertension Father    Scheduled Meds: . allopurinol  100 mg Oral Daily  . aspirin  81 mg Oral Daily  . docusate sodium  100 mg Oral BID  . feeding supplement (ENSURE ENLIVE)  237 mL Oral TID BM  . fluticasone  1 spray Each Nare Daily  . folic acid  1 mg Oral Daily  . gabapentin  200 mg Oral BID  . heparin  5,000 Units Subcutaneous Q8H  . hydrocortisone sod succinate (SOLU-CORTEF) inj  100 mg Intravenous Q8H  . insulin aspart  0-5 Units Subcutaneous QHS  . insulin aspart  0-9 Units Subcutaneous TID WC  . insulin detemir  25 Units Subcutaneous Q2200  . magic mouthwash  5 mL Oral QID   And  . lidocaine  5 mL Mouth/Throat QID  . LORazepam  0-4 mg Oral Q12H  . multivitamin with minerals  1 tablet Oral Daily  . sodium chloride flush  3 mL Intravenous Q12H  .  tamsulosin  0.4 mg Oral QODAY  . thiamine  100 mg Oral Daily   Or  . thiamine  100 mg Intravenous Daily   Continuous Infusions: . sodium chloride 5 mL/hr at 09/16/17 1601  . albumin human    . meropenem (MERREM) IV 1 g (09/16/17 0956)  . norepinephrine (LEVOPHED) Adult infusion 2 mcg/min (09/16/17 0957)  . vancomycin Stopped (09/15/17 2130)   PRN Meds:.sodium chloride, acetaminophen **OR** acetaminophen, ALPRAZolam, alum & mag hydroxide-simeth, bisacodyl, fentaNYL (SUBLIMAZE) injection, HYDROcodone-acetaminophen, LORazepam **OR** LORazepam, ondansetron **OR** ondansetron (ZOFRAN) IV Medications Prior to Admission:  Prior to Admission medications   Medication Sig Start Date End Date Taking? Authorizing Provider  acetaminophen (TYLENOL) 325 MG tablet Take 325 mg by mouth every 6 (six) hours as needed.   Yes [provider]  allopurinol (ZYLOPRIM) 100 MG tablet Take 1 tablet (100 mg total) by mouth daily. 08/27/17  Yes [name], [name], MD  ALPRAZolam Duanne Moron) 1 MG tablet Take 1 mg by mouth as needed for sleep. 06/10/16  Yes [provider]  amLODipine (NORVASC) 5 MG tablet Take 1 tablet (5 mg total) by mouth daily. 04/26/17  Yes [name], [name], MD  aspirin 81 MG chewable tablet Chew 81 mg by mouth daily.   Yes [provider]  gabapentin (NEURONTIN) 100 MG capsule Take 2 capsules (200 mg total) by mouth  3 (three) times daily. Patient taking differently: Take 200 mg by mouth 2 (two) times daily.  04/26/17  Yes [name], [name], MD  insulin aspart (NOVOLOG) 100 UNIT/ML FlexPen Before meals 131-180 2 units, 181-240 4 units, 241-300 6 units, 301-350 8 units, 351-400 10 units, >400 12 units 04/26/17  Yes [name], [name], MD  Insulin Detemir (LEVEMIR FLEXTOUCH) 100 UNIT/ML Pen Inject 40 Units into the skin daily at 10 pm. 05/28/17  Yes [name], [name], MD  lovastatin (MEVACOR) 20 MG tablet Take 1 tablet (20 mg total) by mouth daily at 6  PM. 08/08/17  Yes [name], [name], MD  Nebivolol HCl 20 MG TABS Take 20 mg by mouth daily.   Yes [provider]  ONE TOUCH ULTRA TEST test strip Bid 05/28/17  Yes [name], [name], MD  spironolactone (ALDACTONE) 50 MG tablet Take 1 tablet (50 mg total) by mouth daily. 08/16/17  Yes Jonathon Bellows, MD  tamsulosin (FLOMAX) 0.4 MG CAPS capsule Take 0.4 mg by mouth every other day.  08/02/15  Yes [provider]  albuterol (PROVENTIL HFA;VENTOLIN HFA) 108 (90 Base) MCG/ACT inhaler Inhale 1-2 puffs into the lungs every 6 (six) hours as needed for wheezing or shortness of breath. Patient not taking: Reported on 09/14/2017 05/28/17   [name], [name], MD  colchicine 0.6 MG tablet Take 0.6 mg by mouth every other day.  06/26/15   [provider]  fluticasone Asencion Islam) 50 MCG/ACT nasal spray  03/26/17   [provider]   No Known Allergies Review of Systems  Constitutional: Positive for activity change, appetite change and fatigue.  Gastrointestinal: Positive for abdominal distention and abdominal pain.  Neurological: Positive for weakness.       Bilateral lower extremity neuropathy   All other systems reviewed and are negative.   Physical Exam  Constitutional: He is oriented to person, place, and time. Vital signs are normal. He is cooperative. He has a sickly appearance.  Thin in appearance   Cardiovascular: Normal rate, regular rhythm, normal heart sounds and normal pulses.  Pulmonary/Chest: Effort normal. He has decreased breath sounds.  Abdominal: He exhibits ascites. There is tenderness.  S/p paracentesis 2L, abdominal wall drainage from hernia (bag in place for drainage collection)    Neurological: He is alert and oriented to person, place, and time.  Skin: Skin is warm and dry.  Jaundice   Psychiatric: He has a normal mood and affect. His speech is normal and behavior is normal. Judgment and thought content normal. Cognition and memory are  normal.  Nursing note and vitals reviewed.   Vital Signs: BP 105/61 (BP Location: Left Arm)   Pulse 77   Temp 97.7 F (36.5 C) (Axillary)   Resp 18   Ht '5\' 11"'$  (1.803 m)   Wt 71.5 kg   SpO2 97%   BMI 21.98 kg/m  Pain Scale: 0-10 POSS *See Group Information*: 1-Acceptable,Awake and alert Pain Score: 0-No pain   SpO2: SpO2: 97 % O2 Device:SpO2: 97 % O2 Flow Rate: .O2 Flow Rate (L/min): 2 L/min  IO: Intake/output summary:   Intake/Output Summary (Last 24 hours) at 09/16/2017 1348 Last data filed at 09/16/2017 1003 Gross per 24 hour  Intake 1739 ml  Output 525 ml  Net 1214 ml    LBM: Last BM Date: 09/14/17 Baseline Weight: Weight: 71.7 kg Most recent weight: Weight: 71.5 kg     Palliative Assessment/Data:PPS 30%   Time In: 1300 Time Out: 1430 Time Total: 90 min.  Greater than 50%  of this time was spent counseling and coordinating care related to the above assessment and plan.  Signed by:  Alda Lea, AGPCNP-BC Palliative Medicine Team  Phone: [phone] Fax: [phone] Pager: [phone] Amion: Bjorn Pippin    Please contact Palliative Medicine Team phone at [phone] for questions and concerns.  For individual provider: See Shea Evans

## 2017-09-17 DIAGNOSIS — K7460 Unspecified cirrhosis of liver: Secondary | ICD-10-CM

## 2017-09-17 LAB — BASIC METABOLIC PANEL
Anion gap: 6 (ref 5–15)
Anion gap: 7 (ref 5–15)
BUN: 45 mg/dL — AB (ref 8–23)
BUN: 45 mg/dL — AB (ref 8–23)
CO2: 26 mmol/L (ref 22–32)
CO2: 24 mmol/L (ref 22–32)
CREATININE: 1.46 mg/dL — AB (ref 0.61–1.24)
CREATININE: 1.27 mg/dL — AB (ref 0.61–1.24)
Calcium: 7.7 mg/dL — ABNORMAL LOW (ref 8.9–10.3)
Calcium: 7.8 mg/dL — ABNORMAL LOW (ref 8.9–10.3)
Chloride: 92 mmol/L — ABNORMAL LOW (ref 98–111)
Chloride: 93 mmol/L — ABNORMAL LOW (ref 98–111)
GFR calc Af Amer: 58 mL/min — ABNORMAL LOW (ref >60)
GFR calc non Af Amer: 50 mL/min — ABNORMAL LOW (ref >60)
GFR calc non Af Amer: 59 mL/min — ABNORMAL LOW (ref >60)
GLUCOSE: 279 mg/dL — AB (ref 70–99)
GLUCOSE: 255 mg/dL — AB (ref 70–99)
POTASSIUM: 5.3 mmol/L — AB (ref 3.5–5.1)
Potassium: 5.5 mmol/L — ABNORMAL HIGH (ref 3.5–5.1)
SODIUM: 124 mmol/L — AB (ref 135–145)
Sodium: 124 mmol/L — ABNORMAL LOW (ref 135–145)

## 2017-09-17 LAB — T4: T4, Total: 4.3 ug/dL — ABNORMAL LOW (ref 4.5–12.0)

## 2017-09-17 LAB — CBC
HCT: 28.3 % — ABNORMAL LOW (ref 40.0–52.0)
Hemoglobin: 9.6 g/dL — ABNORMAL LOW (ref 13.0–18.0)
MCH: 28.7 pg (ref 26.0–34.0)
MCHC: 34 g/dL (ref 32.0–36.0)
MCV: 84.4 fL (ref 80.0–100.0)
PLATELETS: 68 10*3/uL — AB (ref 150–440)
RBC: 3.35 MIL/uL — ABNORMAL LOW (ref 4.40–5.90)
RDW: 16.4 % — AB (ref 11.5–14.5)
WBC: 3.8 10*3/uL (ref 3.8–10.6)

## 2017-09-17 LAB — GLUCOSE, CAPILLARY
GLUCOSE-CAPILLARY: 175 mg/dL — AB (ref 70–99)
Glucose-Capillary: 190 mg/dL — ABNORMAL HIGH (ref 70–99)
Glucose-Capillary: 196 mg/dL — ABNORMAL HIGH (ref 70–99)
Glucose-Capillary: 237 mg/dL — ABNORMAL HIGH (ref 70–99)
Glucose-Capillary: 273 mg/dL — ABNORMAL HIGH (ref 70–99)
Glucose-Capillary: 387 mg/dL — ABNORMAL HIGH (ref 70–99)

## 2017-09-17 LAB — T3, FREE: T3 FREE: 1.4 pg/mL — AB (ref 2.0–4.4)

## 2017-09-17 MED ORDER — INSULIN ASPART 100 UNIT/ML IV SOLN
5.0000 [IU] | Freq: Once | INTRAVENOUS | Status: AC
  Administered 2017-09-17: 5 [IU] via INTRAVENOUS
  Filled 2017-09-17: qty 0.05

## 2017-09-17 MED ORDER — SODIUM POLYSTYRENE SULFONATE 15 GM/60ML PO SUSP
30.0000 g | Freq: Once | ORAL | Status: AC
  Administered 2017-09-17: 30 g via ORAL
  Filled 2017-09-17: qty 120

## 2017-09-17 MED ORDER — SODIUM CHLORIDE 0.9 % IV SOLN
1.0000 g | INTRAVENOUS | Status: DC
  Administered 2017-09-17: 18:00:00 1 g via INTRAVENOUS
  Filled 2017-09-17: qty 10
  Filled 2017-09-17: qty 1
  Filled 2017-09-17: qty 10

## 2017-09-17 NOTE — Progress Notes (Signed)
Hinton Dyer, NP notified of CBG recheck at 387. Acknowledged. No new orders at this time.

## 2017-09-17 NOTE — Progress Notes (Signed)
Pharmacy Antibiotic Note  Rodney Stewart is a 62 y.o. male admitted on 09/13/2017 with acute hyponatremia and ascites. Patient transferred to ICU 9/15 am after rapid response called for hypotension and hypoglycemia. Patient with large volume ascites with plan for conservative management per surgery at this time. Pharmacy initially consulted for vancomycin dosing and meropenem dosing.   Plan: Deescalate from vancomycin and meropenem 1 g IV q12h and switch to ceftriaxone for SBP. Initiate ceftriaxone 1g once daily for 7 total days of antibiotics (due to advanced cirrhosis).   Height: 5\' 11"  (180.3 cm) Weight: 150 lb 2.1 oz (68.1 kg) IBW/kg (Calculated) : 75.3  Temp (24hrs), Avg:97.5 F (36.4 C), Min:95.9 F (35.5 C), Max:98.2 F (36.8 C)  Recent Labs  Lab 09/11/17 1031 09/13/17 2144 09/14/17 0443 09/15/17 0506 09/16/17 0532 09/16/17 0537 09/17/17 0331  WBC 3.9 3.3* 3.5*  --  11.9*  --  3.8  CREATININE 1.20 1.35* 1.24 1.87*  --  1.59* 1.46*    Estimated Creatinine Clearance: 51.2 mL/min (A) (by C-G formula based on SCr of 1.46 mg/dL (H)).    No Known Allergies  Antimicrobials this admission: Ceftriaxone 9/17 >> Meropenem 9/15 >> 9/17 Vancomycin 9/15 >> 9/17  Dose adjustments this admission: N/A  Microbiology results: 9/15 MRSA PCR: negative  Thank you for allowing pharmacy to be a part of this patient's care.  Rodney Stewart, PharmD Candidate 09/17/2017 11:42 AM

## 2017-09-17 NOTE — Progress Notes (Signed)
Notified Hinton Dyer, NP of platelets at 30. Acknowledged. See new order entered for heparin to be discontinued.

## 2017-09-17 NOTE — Progress Notes (Signed)
[facility]  ROUNDING NOTE   Subjective:   Na 124   Family at bedside.    Objective:  Vital signs in last 24 hours:  Temp:  [95.9 F (35.5 C)-98.2 F (36.8 C)] 97.6 F (36.4 C) (09/17 1200) Pulse Rate:  [59-77] 68 (09/17 1400) Resp:  [13-20] 15 (09/17 1400) BP: (76-106)/(56-72) 106/70 (09/17 1400) SpO2:  [91 %-99 %] 97 % (09/17 1400) Weight:  [68.1 kg] 68.1 kg (09/17 0600)  Weight change: 1.7 kg Filed Weights   09/15/17 0811 09/16/17 0656 09/17/17 0600  Weight: 66.4 kg 71.5 kg 68.1 kg    Intake/Output: I/O last 3 completed shifts: In: 2690.2 [P.O.:720; I.V.:1376.1; IV Piggyback:594] Out: 4196 [Urine:1525; Other:100]   Intake/Output this shift:  Total I/O In: 630 [P.O.:360; I.V.:5; IV Piggyback:265] Out: 550 [Urine:550]  Physical Exam: General: No acute distress  Head: Normocephalic, atraumatic. Moist oral mucosal membranes  Eyes: Anicteric  Neck: Supple, trachea midline  Lungs:  Clear to auscultation, normal effort  Heart: S1S2 no rubs  Abdomen:  +ostomy, abdominal binder  Extremities: no peripheral edema.  Neurologic: Awake, alert, following commands  Skin: No lesions       Basic Metabolic Panel: Recent Labs  Lab 09/14/17 0443 09/15/17 0506 09/16/17 0532 09/16/17 0537 09/17/17 0331 09/17/17 1227  NA 122* 130*  --  126* 124* 124*  K 4.5 4.9  --  5.5* 5.3* 5.5*  CL 92* 96*  --  95* 92* 93*  CO2 26 26  --  25 26 24   GLUCOSE 110* 59*  --  243* 279* 255*  BUN 16 20  --  33* 45* 45*  CREATININE 1.24 1.87*  --  1.59* 1.46* 1.27*  CALCIUM 8.0* 8.2*  --  7.5* 7.7* 7.8*  MG  --   --  1.8  --   --   --   PHOS  --   --  4.6  --   --   --     Liver Function Tests: Recent Labs  Lab 09/11/17 1031 09/13/17 2144 09/16/17 0537  AST 51* 52* 26  ALT 23 27 14   ALKPHOS 417* 444* 212*  BILITOT 1.9* 1.7* 3.2*  PROT 7.2 6.6 5.3*  ALBUMIN 3.3* 3.0* 2.3*   No results for input(s): LIPASE, AMYLASE in the last 168 hours. No results for  input(s): AMMONIA in the last 168 hours.  CBC: Recent Labs  Lab 09/11/17 1031 09/13/17 2144 09/14/17 0443 09/16/17 0532 09/17/17 0331  WBC 3.9 3.3* 3.5* 11.9* 3.8  NEUTROABS 2.4 2.2  --   --   --   HGB 11.9* 12.0* 10.9* 11.4* 9.6*  HCT 35.2* 35.9* 32.5* 34.3* 28.3*  MCV 82.0 83.9 83.7 84.6 84.4  PLT 159 137* 113* 106* 68*    Cardiac Enzymes: No results for input(s): CKTOTAL, CKMB, CKMBINDEX, TROPONINI in the last 168 hours.  BNP: Invalid input(s): POCBNP  CBG: Recent Labs  Lab 09/16/17 2304 09/17/17 0037 09/17/17 0709 09/17/17 0725 09/17/17 1157  GLUCAP 443* 387* 190* 175* 56*    Microbiology: Results for orders placed or performed during the hospital encounter of 09/13/17  MRSA PCR Screening     Status: None   Collection Time: 09/15/17  8:17 AM  Result Value Ref Range Status   MRSA by PCR NEGATIVE NEGATIVE Final    Comment:        The GeneXpert MRSA Assay (FDA approved for NASAL specimens only), is one component of a comprehensive MRSA colonization surveillance program. It is not intended  to diagnose MRSA infection nor to guide or monitor treatment for MRSA infections. Performed at [facility], [address] [place], [place] [address]     Coagulation Studies: No results for input(s): LABPROT, INR in the last 72 hours.  Urinalysis: No results for input(s): COLORURINE, LABSPEC, PHURINE, GLUCOSEU, HGBUR, BILIRUBINUR, KETONESUR, PROTEINUR, UROBILINOGEN, NITRITE, LEUKOCYTESUR in the last 72 hours.  Invalid input(s): APPERANCEUR    Imaging: US Paracentesis  Result Date: 09/16/2017 CLINICAL DATA:  Cirrhosis, recurrent large volume abdominal ascites. He had some skin breakdown of his umbilical hernia resulting in leakage of a fair amount of peritoneal fluid while at home. EXAM: ULTRASOUND GUIDED PARACENTESIS TECHNIQUE: The procedure, risks (including but not limited to bleeding, infection, organ damage ), benefits, and alternatives were  explained to the patient. Questions regarding the procedure were encouraged and answered. The patient understands and consents to the procedure. Survey ultrasound of the abdomen was performed and an appropriate skin entry site in the lateral right lower abdomen was selected. Skin site was marked, prepped with chlorhexadine, draped in usual sterile fashion, and infiltrated locally with 1% lidocaine. A Safe-T-Centesis needle was advanced into the peritoneal space until fluid could be aspirated. The sheath was advanced and the needle removed. 2 L of clear yellow ascites were aspirated. The patient tolerated the procedure well. COMPLICATIONS: COMPLICATIONS none IMPRESSION: Technically successful ultrasound guided paracentesis, removing 2 L ascites. Electronically Signed   By: Lucrezia Europe M.D.   On: 09/16/2017 12:54     Medications:   . sodium chloride 5 mL/hr at 09/17/17 0800  . norepinephrine (LEVOPHED) Adult infusion Stopped (09/16/17 1611)   . allopurinol  100 mg Oral Daily  . aspirin  81 mg Oral Daily  . docusate sodium  100 mg Oral BID  . feeding supplement (ENSURE ENLIVE)  237 mL Oral TID BM  . folic acid  1 mg Oral Daily  . gabapentin  200 mg Oral BID  . insulin aspart  0-15 Units Subcutaneous TID WC  . insulin aspart  0-5 Units Subcutaneous QHS  . insulin detemir  25 Units Subcutaneous Q2200  . magic mouthwash  5 mL Oral QID   And  . lidocaine  5 mL Mouth/Throat QID  . LORazepam  0-4 mg Oral Q12H  . multivitamin with minerals  1 tablet Oral Daily  . sodium chloride flush  3 mL Intravenous Q12H  . tamsulosin  0.4 mg Oral QODAY  . thiamine  100 mg Oral Daily   Or  . thiamine  100 mg Intravenous Daily   sodium chloride, acetaminophen **OR** acetaminophen, ALPRAZolam, alum & mag hydroxide-simeth, bisacodyl, fentaNYL (SUBLIMAZE) injection, HYDROcodone-acetaminophen, ondansetron **OR** ondansetron (ZOFRAN) IV  Assessment/ Plan:  62 y.o.white male with  BPH, chronic kidney disease stage  III baseline creatinine 1.5, metastatic renal cell carcinoma status post left nephrectomy 05/2012, diabetes mellitus type 2, hypertension, EtoH, admitted now with hyponatremia.    1.  Hyponatremia: initially with hypovolemia and improved with Normal saline infusion. However now trending back down.  - Fluid restriction - Consider restarting furosemide and spironolactone when more hemodynamically stable.   2.  Acute renal failure on chronic kidney disease stage III with proteinuria: baseline Creatinine of 1.5 Acute renal failure from prerenal azotemia and hepatorenal syndrome Chronic kidney disease secondary to solitary kidney, diabetes and liver failure, alcohol abuse.   3.  Anemia of chronic kidney disease with thrombocytopenia   LOS: 3 Rodney Stewart 9/17/20192:12 PM

## 2017-09-17 NOTE — Progress Notes (Signed)
New referral for outpatient palliative to follow at home received from [facility]. Patient information faxed to referral. Thank you. Flo Shanks RN, BSN, [place] [facility] [place], hospital Liaison [phone]

## 2017-09-17 NOTE — Progress Notes (Signed)
Patient woke up confused, took about 15 minutes to fully reorient. Notified Hinton Dyer, NP of changes. Acknowledged. Morning labs drawn at that time to recheck sodium level.

## 2017-09-17 NOTE — Care Management Note (Signed)
Case Management Note  Patient Details  Name: Rodney Stewart MRN: [id number] Date of Birth: [birth year]  Subjective/Objective:                 Patient presented with a leaking hernia.  Currently receiving palliative chemo for extensive metastatic renal carcinoma.  He was transferred to icu due to hypotension and placed on Levophed.  Paracentesis 9/16 and 2 liters of fluid removed.  Palliative consult for goals of care.  Patient is now DNR. His daughter will make medical decisions for him in the event he is not able.  Wishes to continue with chemotherapy   Action/Plan:  Anticipate outpatient palliative care services at discharge. Notified liaison  Expected Discharge Date:                  Expected Discharge Plan:     In-House Referral:     Discharge planning Services     Post Acute Care Choice:    Choice offered to:     DME Arranged:    DME Agency:     HH Arranged:    HH Agency:     Status of Service:     If discussed at [employer], dates discussed:    Additional Comments:  Katrina Stack, RN 09/17/2017, 8:58 AM

## 2017-09-17 NOTE — Progress Notes (Signed)
Notified Dana, NP of potassium at 5.3 and sodium level 124. Acknowledged. See New order entered.

## 2017-09-17 NOTE — Progress Notes (Signed)
   CHIEF COMPLAINT:   Chief Complaint  Patient presents with  . hernia leaking  +ascites  Subjective  Off vasopressors Patient is DNR/DNI Alert and awake No resp distress BP stable  Transfer to gen med floor No fevers, no SOB, no CP     Review of Systems:  Gen:  fatigue HEENT: Denies blurred vision, double vision, ear pain, eye pain, hearing loss, nose bleeds, sore throat Cardiac:  No dizziness, chest pain or heaviness, chest tightness,edema, No JVD Resp:   -shortness of breath Other:  All other systems negative  Objective   Examination:  General exam: Appears calm and comfortable  Respiratory system: Clear to auscultation. Respiratory effort normal. HEENT: [place]/AT, PERRLA, no thrush, no stridor. Cardiovascular system: S1 & S2 heard, RRR. No JVD, murmurs, rubs, gallops or clicks. No pedal edema. Gastrointestinal system: Abdomen is nondistended, soft and nontender. No organomegaly or masses felt. Normal bowel sounds heard. Central nervous system: Alert and oriented. No focal neurological deficits. Extremities: Symmetric 5 x 5 power. Skin: No rashes, lesions or ulcers Psychiatry: Judgement and insight appear normal. Mood & affect appropriate.   VITALS:  height is 5\' 11"  (1.803 m) and weight is 68.1 kg. His axillary temperature is 95.9 F (35.5 C) (abnormal). His blood pressure is 103/70 and his pulse is 71. His respiration is 18 and oxygen saturation is 99%.   I personally reviewed Labs under Results section.  Radiology Reports US Paracentesis  Result Date: 09/16/2017 CLINICAL DATA:  Cirrhosis, recurrent large volume abdominal ascites. He had some skin breakdown of his umbilical hernia resulting in leakage of a fair amount of peritoneal fluid while at home. EXAM: ULTRASOUND GUIDED PARACENTESIS TECHNIQUE: The procedure, risks (including but not limited to bleeding, infection, organ damage ), benefits, and alternatives were explained to the patient. Questions regarding the  procedure were encouraged and answered. The patient understands and consents to the procedure. Survey ultrasound of the abdomen was performed and an appropriate skin entry site in the lateral right lower abdomen was selected. Skin site was marked, prepped with chlorhexadine, draped in usual sterile fashion, and infiltrated locally with 1% lidocaine. A Safe-T-Centesis needle was advanced into the peritoneal space until fluid could be aspirated. The sheath was advanced and the needle removed. 2 L of clear yellow ascites were aspirated. The patient tolerated the procedure well. COMPLICATIONS: COMPLICATIONS none IMPRESSION: Technically successful ultrasound guided paracentesis, removing 2 L ascites. Electronically Signed   By: Lucrezia Europe M.D.   On: 09/16/2017 12:54       Assessment/Plan:  62 yo admitted to step down for hypotention from volume loss ascites with agitation and liver failure, renal failure with metastatic renal cell carcinoma  Overall prognosis is very poor Follow up gen surgery recs Follow up nephrology recs Palliative care consultation Switch abx to rocephin only Adjust SSI and insulin  Transfer to gen med floor   Code Status: DNR/DNI    Corrin Parker, M.D.  [facility]  Medical Director [place] Director [facility] Cardio-Pulmonary Department

## 2017-09-17 NOTE — Progress Notes (Signed)
[place] [facility] [place] NAME: Rodney Stewart    MR#:  [id number]  DATE OF BIRTH:  01-16-1955  SUBJECTIVE:  Family at bedside.  Patient is status post 2 L ascites removal from paracentesis. He is off of pressors Denies dizziness or lightheadedness  REVIEW OF SYSTEMS:    Review of Systems  Constitutional: Negative for fever, chills weight loss HENT: Negative for ear pain, nosebleeds, congestion, facial swelling, rhinorrhea, neck pain, neck stiffness and ear discharge.   Respiratory: Negative for cough, shortness of breath, wheezing  Cardiovascular: Negative for chest pain, palpitations and leg swelling.  Gastrointestinal: Negative for heartburn, abdominal pain, vomiting, diarrhea or consitpation Genitourinary: Negative for dysuria, urgency, frequency, hematuria Musculoskeletal: Negative for back pain or joint pain Neurological: Negative for dizziness, seizures, syncope, focal weakness,  numbness and headaches.  Hematological: Does not bruise/bleed easily.  Psychiatric/Behavioral: Negative for hallucinations, confusion, dysphoric mood    Tolerating Diet: yes      DRUG ALLERGIES:  No Known Allergies  VITALS:  Blood pressure 103/70, pulse 71, temperature (!) 95.9 F (35.5 C), temperature source Axillary, resp. rate 18, height 5\' 11"  (1.803 m), weight 68.1 kg, SpO2 99 %.  PHYSICAL EXAMINATION:  Constitutional: Appears well-developed and well-nourished. No distress. HENT: Normocephalic. Marland Kitchen Oropharynx is clear and moist.  Eyes: Conjunctivae and EOM are normal. PERRLA, no scleral icterus.  Neck: Normal ROM. Neck supple. No JVD. No tracheal deviation. CVS: RRR, S1/S2 +, no murmurs, no gallops, no carotid bruit.  Pulmonary: Effort and breath sounds normal, no stridor, rhonchi, wheezes, rales.  Abdominal: Distended abdomen with ostomy bag on umbilicus for ascites drainage  musculoskeletal: Normal range of motion. No edema and no tenderness.   Neuro: Alert. CN 2-12 grossly intact. No focal deficits. Skin: Skin is warm and dry. No rash noted. Psychiatric: Normal mood and affect.      LABORATORY PANEL:   CBC Recent Labs  Lab 09/17/17 0331  WBC 3.8  HGB 9.6*  HCT 28.3*  PLT 68*   ------------------------------------------------------------------------------------------------------------------  Chemistries  Recent Labs  Lab 09/16/17 0532 09/16/17 0537 09/17/17 0331  NA  --  126* 124*  K  --  5.5* 5.3*  CL  --  95* 92*  CO2  --  25 26  GLUCOSE  --  243* 279*  BUN  --  33* 45*  CREATININE  --  1.59* 1.46*  CALCIUM  --  7.5* 7.7*  MG 1.8  --   --   AST  --  26  --   ALT  --  14  --   ALKPHOS  --  212*  --   BILITOT  --  3.2*  --    ------------------------------------------------------------------------------------------------------------------  Cardiac Enzymes No results for input(s): TROPONINI in the last 168 hours. ------------------------------------------------------------------------------------------------------------------  RADIOLOGY:  US Paracentesis  Result Date: 09/16/2017 CLINICAL DATA:  Cirrhosis, recurrent large volume abdominal ascites. He had some skin breakdown of his umbilical hernia resulting in leakage of a fair amount of peritoneal fluid while at home. EXAM: ULTRASOUND GUIDED PARACENTESIS TECHNIQUE: The procedure, risks (including but not limited to bleeding, infection, organ damage ), benefits, and alternatives were explained to the patient. Questions regarding the procedure were encouraged and answered. The patient understands and consents to the procedure. Survey ultrasound of the abdomen was performed and an appropriate skin entry site in the lateral right lower abdomen was selected. Skin site was marked, prepped with chlorhexadine, draped in usual sterile fashion, and infiltrated locally with  1% lidocaine. A Safe-T-Centesis needle was advanced into the peritoneal space until fluid could  be aspirated. The sheath was advanced and the needle removed. 2 L of clear yellow ascites were aspirated. The patient tolerated the procedure well. COMPLICATIONS: COMPLICATIONS none IMPRESSION: Technically successful ultrasound guided paracentesis, removing 2 L ascites. Electronically Signed   By: Lucrezia Europe M.D.   On: 09/16/2017 12:54     ASSESSMENT AND PLAN:    62 year old male with stage IV renal cell carcinoma with lung mets, chronic cirrhosis due to EtOH, diabetes and hypertension who presented to the ER with drainage from abdominal wall.  1.  Hypovolemic shock due to large volume drainage from ascites fluid This has resolved and he is off of pressors Patient may have SBP so for now treating with Rocephin. No labs to confirm this.  2.  Diabetes; Continue SSI  3.  Hyponatremia from liver cirrhosis and dehydration Sodium low this ma after paracentesis Management as per nephrology.  4.  Acute kidney injury: Creatinine has improved.   5.  Stage IV renal cell carcinoma with metastatic disease: Patient is status post left nephrectomy Continue palliative chemotherapy He will have outpatient palliative care follow-up    6.  EtOH related cirrhosis and ascites: Patient has high surgical risk secondary to multiple comorbidities  7  Hyperkalemia: Needs to be treated with with Kayexalate, dextrose and insulin  Discussed with family at bedside Patient with overall poor prognosis and will have outpatient positive care follow-up  Management plans discussed with the patient and he is in agreement.  CODE STATUS: full  TOTAL TIME TAKING CARE OF THIS PATIENT: 23 minutes.     POSSIBLE D/C 2 days, DEPENDING ON CLINICAL CONDITION.   Corley Maffeo M.D on 09/17/2017 at 12:05 PM  Between 7am to 6pm - Pager - [phone] After 6pm go to [email or web address] - password [email or web address] [facility]  Office  [phone]  CC: Primary care physician; [name], [name],  MD  Note: This dictation was prepared with Dragon dictation along with smaller phrase technology. Any transcriptional errors that result from this process are unintentional.

## 2017-09-17 NOTE — Progress Notes (Signed)
Patient transferred to room 113. Report called to [place], RN on 1C. Patient alert and oriented and aware of transfer, declined for RN to call his daughters. No drainage out of bag attached to ostomy appliance over hernia site. Patient reported no pain, nausea, or dizziness during shift. Transferred with stand pivot to new bed with no difficulties and stand by assist.

## 2017-09-17 NOTE — Progress Notes (Signed)
PT Cancellation Note  Patient Details Name: Rodney Stewart MRN: [id number] DOB: [birth year]   Cancelled Treatment:    Reason Eval/Treat Not Completed: Other (comment).  PT consult received.  Chart reviewed.  Pt's potassium noted to be elevated to 5.5.  Per PT guidelines for potassium >5.1, will hold PT at this time and re-attempt PT evaluation tomorrow as medically appropriate.   Leitha Bleak, PT 09/17/17, 1:54 PM [phone]

## 2017-09-17 NOTE — Progress Notes (Addendum)
Inpatient Diabetes Program Recommendations  AACE/ADA: New Consensus Statement on Inpatient Glycemic Control (2019)  Target Ranges:  Prepandial:   less than 140 mg/dL      Peak postprandial:   less than 180 mg/dL (1-2 hours)      Critically ill patients:  140 - 180 mg/dL   Results for Gault, Marcy B (MRN [id number]) as of 09/17/2017 09:27  Ref. Range 09/16/2017 07:37 09/16/2017 13:14 09/16/2017 15:52 09/16/2017 21:28 09/16/2017 23:04 09/17/2017 00:37 09/17/2017 05:47 09/17/2017 07:09 09/17/2017 07:25  Glucose-Capillary Latest Ref Range: 70 - 99 mg/dL 212 (H)  Novolog 3 units  Solucortef 100 mg 250 (H)  Novolog 3 units 298 (H)  Novolog 5 units  Solucortef 100 mg 430 (H)  Novolog 9 units  Levemir 25 units 443 (H)  Novolog 15 units 387 (H)     Solucortef 50 mg    Novolog 5 units  190 (H) 175 (H)  Novolog 3 units   Review of Glycemic Control  Diabetes history: DM2 Outpatient Diabetes medications: Levemir 40 units QHS, Novolog 0-12 units TID with meals Current orders for Inpatient glycemic control: Levemir 25 units QHS, Novolog 0-15 units TID with meals, Novolog 0-5 units QHS; Solucortef 50 mg Q8H  Inpatient Diabetes Program Recommendations:  Insulin - Basal: If steroids are continued, please consider increasing Levemir to 35 units QHS. Insulin - Meal Coverage: If steroids are continued, please consider ordering Novolog 5 units TID with meals for meal coverage if patient eats at least 50% of meals. Diet: Please discontinue Regular diet and order Carb Modified diet.  Thanks, Barnie Alderman, RN, MSN, CDE Diabetes Coordinator Inpatient Diabetes Program [phone] (Team Pager from 8am to 5pm)

## 2017-09-17 NOTE — Progress Notes (Addendum)
Daily Progress Note   Patient Name: Rodney Stewart       Date: 09/17/2017 DOB: 12/16/1955  Age: 62 y.o. MRN#: [id number] Attending Physician: Bettey Costa, MD Primary Care Physician: [name], [name], MD Admit Date: 09/13/2017  Reason for Consultation/Follow-up: Establishing goals of care  Subjective: Patient sitting up in bed visiting with brother. Denies pain or discomfort at this time. States he continues to have decreased appetite although he feels he has eaten more while hospitalized than he did prior to hospitalization. States he ate more than half of his breakfast, but finds if he eats small frequent meals/snacks he does better. Tolerating protein shakes. Denies nausea endorses alterations of taste which he feels in relation to his chemotherapy treatments. Reports that he has noticed a decrease in drainage at hernia site since paracentesis.   Briefly reviewed goals of care discussion on yesterday. Patient verbalized understanding and informed his brother of his wishes to be a DNR/DNI. Brother was supportive and respectfully expressed his wishes are for his brother to be comfortable and his support to whatever patient wants. Patient also confirmed request to have outpatient palliative once discharged from hospital as additional support. Again he expresses his plans are to continue with chemotherapy until he is told otherwise that treatments are no longer and option. Support given.   Chart Reviewed and report received.  Length of Stay: 3  Current Medications: Scheduled Meds:  . allopurinol  100 mg Oral Daily  . aspirin  81 mg Oral Daily  . docusate sodium  100 mg Oral BID  . feeding supplement (ENSURE ENLIVE)  237 mL Oral TID BM  . folic acid  1 mg Oral Daily  . gabapentin  200 mg  Oral BID  . insulin aspart  0-15 Units Subcutaneous TID WC  . insulin aspart  0-5 Units Subcutaneous QHS  . insulin detemir  25 Units Subcutaneous Q2200  . magic mouthwash  5 mL Oral QID   And  . lidocaine  5 mL Mouth/Throat QID  . LORazepam  0-4 mg Oral Q12H  . multivitamin with minerals  1 tablet Oral Daily  . sodium chloride flush  3 mL Intravenous Q12H  . tamsulosin  0.4 mg Oral QODAY  . thiamine  100 mg Oral Daily   Or  . thiamine  100 mg Intravenous  Daily    Continuous Infusions: . sodium chloride 5 mL/hr at 09/17/17 0800  . norepinephrine (LEVOPHED) Adult infusion Stopped (09/16/17 1611)    PRN Meds: sodium chloride, acetaminophen **OR** acetaminophen, ALPRAZolam, alum & mag hydroxide-simeth, bisacodyl, fentaNYL (SUBLIMAZE) injection, HYDROcodone-acetaminophen, ondansetron **OR** ondansetron (ZOFRAN) IV  Physical Exam  Constitutional: He is oriented to person, place, and time. Vital signs are normal. He appears cachectic. He is cooperative. He appears ill.  Thin in appearance, chronically ill   Cardiovascular: Normal rate, regular rhythm, normal heart sounds and normal pulses.  Hypotensive   Pulmonary/Chest: Effort normal. He has decreased breath sounds.  Abdominal: Soft. Bowel sounds are normal. He exhibits distension.  Ascites s/p 2L paracentesis 9/16  Neurological: He is alert and oriented to person, place, and time.  Skin: Skin is warm.  Jaundice   Nursing note and vitals reviewed.        Vital Signs: BP 103/70   Pulse 71   Temp (!) 95.9 F (35.5 C) (Axillary) Comment: room tempearture adjusted  Resp 18   Ht 5\' 11"  (1.803 m)   Wt 68.1 kg   SpO2 99%   BMI 20.94 kg/m  SpO2: SpO2: 99 % O2 Device: O2 Device: Room Air O2 Flow Rate: O2 Flow Rate (L/min): 2 L/min  Intake/output summary:   Intake/Output Summary (Last 24 hours) at 09/17/2017 1124 Last data filed at 09/17/2017 0900 Gross per 24 hour  Intake 1256.16 ml  Output 1100 ml  Net 156.16 ml   LBM:  Last BM Date: 09/14/17 Baseline Weight: Weight: 71.7 kg Most recent weight: Weight: 68.1 kg      Palliative Assessment/Data: PPS 30%   Flowsheet Rows     Most Recent Value  Intake Tab  Referral Department  Hospitalist  Unit at Time of Referral  ICU  Palliative Care Primary Diagnosis  Cancer  Date Notified  09/15/17  Palliative Care Type  New Palliative care  Reason for referral  Clarify Goals of Care  Date of Admission  09/13/17  Date first seen by Palliative Care  09/16/17  # of days Palliative referral response time  1 Day(s)  # of days IP prior to Palliative referral  2  Clinical Assessment  Psychosocial & Spiritual Assessment  Palliative Care Outcomes     Patient Active Problem List   Diagnosis Date Noted  . Protein-calorie malnutrition, severe 09/15/2017  . Acute hyponatremia 09/14/2017  . Ascites   . Hyponatremia 05/28/2017  . Colon polyps 04/26/2017  . Umbilical hernia 73/53/2992  . Left inguinal hernia 04/26/2017  . Gout 04/26/2017  . Fatty liver 04/26/2017  . Leukopenia 04/26/2017  . Gallstones 04/26/2017  . Type 2 diabetes mellitus with diabetic neuropathy, unspecified ([place]) 04/26/2017  . CAD (coronary artery disease) 04/26/2017  . Chronic knee pain 04/26/2017  . Protein-calorie malnutrition ([place]) 04/26/2017  . Cirrhosis ([place]) 02/17/2017  . Neutropenia associated with infection ([place])   . Malignant neoplasm of kidney ([place])   . Thrombocytopenia ([place])   . Iron deficiency anemia 01/23/2017  . Goals of care, counseling/discussion 11/27/2016  . BPH (benign prostatic hyperplasia) 10/27/2016  . Effusion of left olecranon bursa 10/27/2016  . Foreign body in left ear 10/27/2016  . Pancytopenia ([place]) 10/27/2016  . Alcohol abuse 08/28/2016  . Nonimmune to hepatitis B virus 08/02/2016  . Metastatic renal cell carcinoma to lung ([place])   . ARF (acute renal failure) ([place]) 09/30/2015  . Type 2 diabetes mellitus with moderate nonproliferative diabetic retinopathy and  without macular edema ([place]) 09/17/2014  .  Cataract 08/20/2014  . Hypertension 07/02/2014  . CKD (chronic kidney disease) stage 3, GFR 30-59 ml/min ([facility]) 11/30/2013  . Single kidney 02/04/2013  . Renal cell carcinoma ([place]) 01/02/2012    Palliative Care Assessment & Plan   Patient Profile: 62 y.o. male  admitted on 09/13/2017 from home with complaints of abdominal wall drainage.  He has a past medical history significant for advanced metastatic renal cell carcinoma lung mets, (undergoing Opdivo tx with oncologist, Dr. Grayland Ormond), chronic liver failure/EtOH cirrhosis, diabetes type 2, BPH, stage III chronic kidney disease, GERD, hypertension, and umbilical hernia.  Patient presented to the ED with drainage from a wound on his abdomen all near his umbilical hernia.  Patient reported to admitting provider he started noticing a lot of yellow-colored clear liquid drainage for several hours.  Patient continues to drink beer regularly despite medical conditions.  During his ED course sodium was 117, creatinine 1.35, potassium 5.2, WBCs were within normal limits.  CT abdomen showed periumbilical hernia containing slightly loculated fluid collection, slightly decreased as compared to the previous CT, cirrhosis of the liver with portal hypertension, splenomegaly with varices, moderate to large volume of ascites was also seen with an abdomen and pelvis.  Since admission patient has been admitted to stepdown for hypotension.  He has been seen by general surgery, oncology, and nephrology.  He is S/P paracentesis which yielded 2 L.  Palliative medicine team consulted for goals of care discussion.  Assessment: Patient sitting up and visiting with brother. Smiling and laughing. Denies pain/discomfort/ or shortness of breath. Continues to have poor appetite, however attempting to eat small snacks in between meals along with protein/calorie shakes.   Recommendations/Plan:  DNR/DNI  Continue to treat the treatable.  Plans to continue with chemotherapy treatments.   Outpatient Palliative Services at discharge.   Palliative will continue to follow as needed.   Goals of Care and Additional Recommendations:  Limitations on Scope of Treatment: Full Scope Treatment  Code Status:    Code Status Orders  (From admission, onward)         Start     Ordered   09/16/17 1346  Do not attempt resuscitation (DNR)  Continuous    Question Answer Comment  In the event of cardiac or respiratory ARREST Do not call a "code blue"   In the event of cardiac or respiratory ARREST Do not perform Intubation, CPR, defibrillation or ACLS   In the event of cardiac or respiratory ARREST Use medication by any route, position, wound care, and other measures to relive pain and suffering. May use oxygen, suction and manual treatment of airway obstruction as needed for comfort.      09/16/17 1346        Code Status History    Date Active Date Inactive Code Status Order ID Comments User Context   09/14/2017 0254 09/16/2017 1346 Full Code [id number]  Amelia Jo, MD Inpatient   01/25/2017 1037 01/27/2017 1704 Full Code [id number]  Bettey Costa, MD ED   09/30/2015 1609 10/03/2015 1530 Full Code [id number]  Demetrios Loll, MD Inpatient      Prognosis:   Unable to determine Guarded to poor- in the setting of stage IV metastatic clear renal cell carcinoma with lung mets, EtOH cirrhosis, CKD stage III, medical hernia, severe protein calorie malnutrition, weight loss greater than 10%, hypertension, single kidney, diabetes, alcohol abuse, poor p.o. intake, and coronary artery disease.  Discharge Planning:  To Be Determined with outpatient Palliative services at discharge.   Care plan  was discussed with patient and bedside RN.   Thank you for allowing the Palliative Medicine Team to assist in the care of this patient.   Total Time 35 min.  Prolonged Time Billed NO       Greater than 50%  of this time was spent counseling and  coordinating care related to the above assessment and plan.  Alda Lea, AGPCNP-BC Palliative Medicine Team  Pager: [phone] Amion: Bjorn Pippin   Please contact Palliative Medicine Team phone at [phone] for questions and concerns.

## 2017-09-17 NOTE — Plan of Care (Signed)
  Problem: Spiritual Needs Goal: Ability to function at adequate level Outcome: Progressing   Problem: Education: Goal: Knowledge of General Education information will improve Description Including pain rating scale, medication(s)/side effects and non-pharmacologic comfort measures Outcome: Progressing   Problem: Health Behavior/Discharge Planning: Goal: Ability to manage health-related needs will improve Outcome: Progressing   Problem: Clinical Measurements: Goal: Ability to maintain clinical measurements within normal limits will improve Outcome: Progressing Goal: Diagnostic test results will improve Outcome: Progressing   Problem: Nutrition: Goal: Adequate nutrition will be maintained Outcome: Progressing   Problem: Safety: Goal: Ability to remain free from injury will improve Outcome: Progressing

## 2017-09-18 LAB — BASIC METABOLIC PANEL
Anion gap: 7 (ref 5–15)
BUN: 49 mg/dL — ABNORMAL HIGH (ref 8–23)
CALCIUM: 8.1 mg/dL — AB (ref 8.9–10.3)
CO2: 26 mmol/L (ref 22–32)
CREATININE: 1.16 mg/dL (ref 0.61–1.24)
Chloride: 97 mmol/L — ABNORMAL LOW (ref 98–111)
GFR calc non Af Amer: >60 mL/min (ref >60)
GLUCOSE: 180 mg/dL — AB (ref 70–99)
Potassium: 4.2 mmol/L (ref 3.5–5.1)
Sodium: 130 mmol/L — ABNORMAL LOW (ref 135–145)

## 2017-09-18 LAB — GLUCOSE, CAPILLARY: Glucose-Capillary: 92 mg/dL (ref 70–99)

## 2017-09-18 MED ORDER — ENSURE ENLIVE PO LIQD: 237.0000 mL | Freq: Three times a day (TID) | ORAL | 12 refills | Status: AC

## 2017-09-18 NOTE — Progress Notes (Signed)
Discussed discharge instructions and medications with patient. IV and PICC removed. All questions addressed. Patient transported home via car by his daughter.  Clarise Cruz, RN, BSN

## 2017-09-18 NOTE — Progress Notes (Signed)
[facility]  ROUNDING NOTE   Subjective:   Na 130  Patient to be discharged.   Objective:  Vital signs in last 24 hours:  Temp:  [97.6 F (36.4 C)-97.7 F (36.5 C)] 97.7 F (36.5 C) (09/17 2009) Pulse Rate:  [61-68] 61 (09/18 0506) Resp:  [15-20] 18 (09/18 0506) BP: (96-114)/(63-73) 114/73 (09/18 0506) SpO2:  [97 %-100 %] 100 % (09/18 0506) Weight:  [31.3 kg] 31.3 kg (09/18 0500)  Weight change: -36.8 kg Filed Weights   09/16/17 0656 09/17/17 0600 09/18/17 0500  Weight: 71.5 kg 68.1 kg 31.3 kg    Intake/Output: I/O last 3 completed shifts: In: 1454.8 [P.O.:540; I.V.:206.3; IV Piggyback:708.5] Out: 1300 [Urine:1300]   Intake/Output this shift:  No intake/output data recorded.  Physical Exam: General: No acute distress  Head: Normocephalic, atraumatic. Moist oral mucosal membranes  Eyes: Anicteric  Neck: Supple, trachea midline  Lungs:  Clear to auscultation, normal effort  Heart: S1S2 no rubs  Abdomen:  +ostomy, abdominal binder  Extremities: no peripheral edema.  Neurologic: Awake, alert, following commands  Skin: No lesions       Basic Metabolic Panel: Recent Labs  Lab 09/15/17 0506 09/16/17 0532 09/16/17 0537 09/17/17 0331 09/17/17 1227 09/18/17 0453  NA 130*  --  126* 124* 124* 130*  K 4.9  --  5.5* 5.3* 5.5* 4.2  CL 96*  --  95* 92* 93* 97*  CO2 26  --  25 26 24 26   GLUCOSE 59*  --  243* 279* 255* 180*  BUN 20  --  33* 45* 45* 49*  CREATININE 1.87*  --  1.59* 1.46* 1.27* 1.16  CALCIUM 8.2*  --  7.5* 7.7* 7.8* 8.1*  MG  --  1.8  --   --   --   --   PHOS  --  4.6  --   --   --   --     Liver Function Tests: Recent Labs  Lab 09/13/17 2144 09/16/17 0537  AST 52* 26  ALT 27 14  ALKPHOS 444* 212*  BILITOT 1.7* 3.2*  PROT 6.6 5.3*  ALBUMIN 3.0* 2.3*   No results for input(s): LIPASE, AMYLASE in the last 168 hours. No results for input(s): AMMONIA in the last 168 hours.  CBC: Recent Labs  Lab 09/13/17 2144  09/14/17 0443 09/16/17 0532 09/17/17 0331  WBC 3.3* 3.5* 11.9* 3.8  NEUTROABS 2.2  --   --   --   HGB 12.0* 10.9* 11.4* 9.6*  HCT 35.9* 32.5* 34.3* 28.3*  MCV 83.9 83.7 84.6 84.4  PLT 137* 113* 106* 68*    Cardiac Enzymes: No results for input(s): CKTOTAL, CKMB, CKMBINDEX, TROPONINI in the last 168 hours.  BNP: Invalid input(s): POCBNP  CBG: Recent Labs  Lab 09/17/17 0725 09/17/17 1157 09/17/17 1640 09/17/17 2155 09/18/17 0744  GLUCAP 175* 237* 196* 273* 92    Microbiology: Results for orders placed or performed during the hospital encounter of 09/13/17  MRSA PCR Screening     Status: None   Collection Time: 09/15/17  8:17 AM  Result Value Ref Range Status   MRSA by PCR NEGATIVE NEGATIVE Final    Comment:        The GeneXpert MRSA Assay (FDA approved for NASAL specimens only), is one component of a comprehensive MRSA colonization surveillance program. It is not intended to diagnose MRSA infection nor to guide or monitor treatment for MRSA infections. Performed at [facility], [address], [place], [place] [address]  Coagulation Studies: No results for input(s): LABPROT, INR in the last 72 hours.  Urinalysis: No results for input(s): COLORURINE, LABSPEC, PHURINE, GLUCOSEU, HGBUR, BILIRUBINUR, KETONESUR, PROTEINUR, UROBILINOGEN, NITRITE, LEUKOCYTESUR in the last 72 hours.  Invalid input(s): APPERANCEUR    Imaging: US Paracentesis  Result Date: 09/16/2017 CLINICAL DATA:  Cirrhosis, recurrent large volume abdominal ascites. He had some skin breakdown of his umbilical hernia resulting in leakage of a fair amount of peritoneal fluid while at home. EXAM: ULTRASOUND GUIDED PARACENTESIS TECHNIQUE: The procedure, risks (including but not limited to bleeding, infection, organ damage ), benefits, and alternatives were explained to the patient. Questions regarding the procedure were encouraged and answered. The patient understands and consents to the  procedure. Survey ultrasound of the abdomen was performed and an appropriate skin entry site in the lateral right lower abdomen was selected. Skin site was marked, prepped with chlorhexadine, draped in usual sterile fashion, and infiltrated locally with 1% lidocaine. A Safe-T-Centesis needle was advanced into the peritoneal space until fluid could be aspirated. The sheath was advanced and the needle removed. 2 L of clear yellow ascites were aspirated. The patient tolerated the procedure well. COMPLICATIONS: COMPLICATIONS none IMPRESSION: Technically successful ultrasound guided paracentesis, removing 2 L ascites. Electronically Signed   By: Lucrezia Europe M.D.   On: 09/16/2017 12:54     Medications:   . sodium chloride Stopped (09/17/17 1224)  . cefTRIAXone (ROCEPHIN)  IV Stopped (09/17/17 1833)   . allopurinol  100 mg Oral Daily  . aspirin  81 mg Oral Daily  . docusate sodium  100 mg Oral BID  . feeding supplement (ENSURE ENLIVE)  237 mL Oral TID BM  . folic acid  1 mg Oral Daily  . gabapentin  200 mg Oral BID  . insulin aspart  0-15 Units Subcutaneous TID WC  . insulin aspart  0-5 Units Subcutaneous QHS  . insulin detemir  25 Units Subcutaneous Q2200  . magic mouthwash  5 mL Oral QID   And  . lidocaine  5 mL Mouth/Throat QID  . multivitamin with minerals  1 tablet Oral Daily  . sodium chloride flush  3 mL Intravenous Q12H  . tamsulosin  0.4 mg Oral QODAY  . thiamine  100 mg Oral Daily   Or  . thiamine  100 mg Intravenous Daily   sodium chloride, acetaminophen **OR** acetaminophen, ALPRAZolam, alum & mag hydroxide-simeth, bisacodyl, fentaNYL (SUBLIMAZE) injection, HYDROcodone-acetaminophen, ondansetron **OR** ondansetron (ZOFRAN) IV  Assessment/ Plan:  62 y.o.white male with  BPH, chronic kidney disease stage III baseline creatinine 1.5, metastatic renal cell carcinoma status post left nephrectomy 05/2012, diabetes mellitus type 2, hypertension, EtoH, admitted now with hyponatremia.     1.  Hyponatremia: initially with hypovolemia and improved with Normal saline infusion. However now trending back down.  - Fluid restriction - Consider restarting furosemide and spironolactone as outpatient.   2.  Acute renal failure on chronic kidney disease stage III with proteinuria: baseline Creatinine of 1.5 Acute renal failure from prerenal azotemia and hepatorenal syndrome Chronic kidney disease secondary to solitary kidney, diabetes and liver failure, alcohol abuse.   3.  Anemia of chronic kidney disease with thrombocytopenia   LOS: 4 Rodney Stewart 9/18/201910:31 AM

## 2017-09-18 NOTE — Care Management (Signed)
Telephone conversation with Rodney Stewart. Declining Home Health Services at this time. Outpatient palliative has been ordered [name] R\N MSN [facility] [phone]

## 2017-09-18 NOTE — Evaluation (Signed)
Physical Therapy Evaluation Patient Details Name: FRANCISCO OSTROVSKY MRN: [id number] DOB: 1955/03/13 Today's Date: 09/18/2017   History of Present Illness  Pt is a 62 y.o. male presenting to hospital 09/13/17 with acute onset drainage from abdominal wound.  Pt admitted with hyponatremia, thrombocytopenia, and ascites leak from long-standing umbilical hernia secondary long standing alcoholism.  Transferred to CCU 09/15/17 d/t low blood sugar, low BP, and bradycardia.  Now transferred back to medical floor.  PMH includes anemia, BPH, CKD, DM, htn, neuropathy, cirrhosis, neutropenia, thrombocytopenia, ETOH abuse, metastatic renal cell carcinoma to lung, ARF, back surgery.  Clinical Impression  Prior to hospital admission, pt was active and independent.  Pt lives alone (although daughter planning on staying with pt initially on hospital discharge) in 2 level home with stairs to 2nd floor bedroom.  Currently pt is modified independent with bed mobility, independent with transfers, and CGA ambulating around nursing loop with RW.  Pt initially shaky ambulating without assistive device but no shakiness noted ambulating with RW (pt appearing steady with RW).  Pt would benefit from skilled PT to address noted impairments and functional limitations during hospitalization (see below for any additional details).  Pt reports h/o using RW and used RW safely during session.  Pt denying any pain during session.  Anticipate with continued mobility, no further PT needs will be needed.  Recommend pt follow-up with PCP for any further PT needs upon hospital discharge.    Follow Up Recommendations (Follow-up with PCP for any further PT needs)    Equipment Recommendations  Rolling walker with 5" wheels(pt has own RW at home)    Recommendations for Other Services       Precautions / Restrictions Precautions Precautions: Fall Precaution Comments: Abdominal binder Restrictions Weight Bearing Restrictions: No       Mobility  Bed Mobility Overal bed mobility: Modified Independent             General bed mobility comments: Semi supine to/from sit without any noted difficulty  Transfers Overall transfer level: Independent Equipment used: None             General transfer comment: steady strong transfers without AD  Ambulation/Gait Ambulation/Gait assistance: Min guard Gait Distance (Feet): (30 feet no AD; 280 feet with RW) Assistive device: None;Rolling walker (2 wheeled)   Gait velocity: decreased   General Gait Details: pt appearing shaky (although no loss of balance) without UE support ambulating (pt holding onto objects intermittently d/t shakiness though) but only CGA provided; pt educated on use of RW and improved steadiness (no shakiness noted) and gait mechanics noted with RW use  Stairs Stairs: Yes Stairs assistance: Min guard Stair Management: Two rails;Alternating pattern;Forwards Number of Stairs: 6 General stair comments: steady safe stairs navigation  Wheelchair Mobility    Modified Rankin (Stroke Patients Only)       Balance Overall balance assessment: Needs assistance Sitting-balance support: No upper extremity supported;Feet supported Sitting balance-Leahy Scale: Normal Sitting balance - Comments: steady sitting reaching outside BOS   Standing balance support: No upper extremity supported Standing balance-Leahy Scale: Good Standing balance comment: steady standing reaching within BOS                             Pertinent Vitals/Pain Pain Assessment: No/denies pain  HR WFL during session.    Home Living Family/patient expects to be discharged to:: Private residence Living Arrangements: Alone(Pt's daughter plans to stay with pt upon discharge) Available Help  at Discharge: Family Type of Home: House Home Access: Level entry(from garage)     Home Layout: Two level;Bed/bath upstairs Home Equipment: Walker - 2 wheels      Prior  Function Level of Independence: Independent         Comments: Pt active prior to admission     Hand Dominance        Extremity/Trunk Assessment   Upper Extremity Assessment Upper Extremity Assessment: Generalized weakness    Lower Extremity Assessment Lower Extremity Assessment: Generalized weakness    Cervical / Trunk Assessment Cervical / Trunk Assessment: Normal  Communication   Communication: No difficulties  Cognition Arousal/Alertness: Awake/alert Behavior During Therapy: WFL for tasks assessed/performed Overall Cognitive Status: Within Functional Limits for tasks assessed                                        General Comments General comments (skin integrity, edema, etc.): abdominal binder in place.  Nursing cleared pt for participation in physical therapy.  Pt agreeable to PT session.    Exercises     Assessment/Plan    PT Assessment Patient needs continued PT services  PT Problem List Decreased strength;Decreased balance;Decreased mobility       PT Treatment Interventions DME instruction;Gait training;Stair training;Functional mobility training;Therapeutic activities;Therapeutic exercise;Balance training;Patient/family education    PT Goals (Current goals can be found in the Care Plan section)  Acute Rehab PT Goals Patient Stated Goal: to go home PT Goal Formulation: With patient Time For Goal Achievement: 10/02/17 Potential to Achieve Goals: Good    Frequency Min 2X/week   Barriers to discharge        Co-evaluation               AM-PAC PT "6 Clicks" Daily Activity  Outcome Measure Difficulty turning over in bed (including adjusting bedclothes, sheets and blankets)?: None Difficulty moving from lying on back to sitting on the side of the bed? : None Difficulty sitting down on and standing up from a chair with arms (e.g., wheelchair, bedside commode, etc,.)?: None Help needed moving to and from a bed to chair  (including a wheelchair)?: None Help needed walking in hospital room?: A Little Help needed climbing 3-5 steps with a railing? : A Little 6 Click Score: 22    End of Session Equipment Utilized During Treatment: Gait belt(up high away from abdomen) Activity Tolerance: Patient tolerated treatment well Patient left: in chair;with call bell/phone within reach;with family/visitor present(Per discussion with pt's nurse, no chair alarm required) Nurse Communication: Mobility status;Precautions;Other (comment) PT Visit Diagnosis: Unsteadiness on feet (R26.81);Other abnormalities of gait and mobility (R26.89);Muscle weakness (generalized) (M62.81)    Time: 1035-1050 PT Time Calculation (min) (ACUTE ONLY): 15 min   Charges:   PT Evaluation $PT Eval Low Complexity: 1 Low        Abryana Lykens, PT 09/18/17, 12:54 PM [phone]

## 2017-09-18 NOTE — Discharge Summary (Signed)
[facility] [place] NAME: Rodney Stewart    MR#:  [id number]  DATE OF BIRTH:  November 23, 1955  DATE OF ADMISSION:  09/13/2017 ADMITTING PHYSICIAN: Amelia Jo, MD  DATE OF DISCHARGE: 09/18/2017  PRIMARY CARE PHYSICIAN: [name], [name], MD    ADMISSION DIAGNOSIS:  Hyponatremia [E87.1] Other ascites [J18.8] Umbilical hernia without obstruction and without gangrene [K42.9]  DISCHARGE DIAGNOSIS:  Active Problems:   Acute hyponatremia   Ascites   Protein-calorie malnutrition, severe   SECONDARY DIAGNOSIS:   Past Medical History:  Diagnosis Date  . Anemia   . BPH (benign prostatic hyperplasia)   . CKD (chronic kidney disease) stage 3, GFR 30-59 ml/min (HCC)   . Clear cell renal cell carcinoma ([place]) 2014   Left Nephrectomy.  . Colon polyps   . Diabetes mellitus without complication ([place])    type 2   . Dyspnea    with exertion  . GERD (gastroesophageal reflux disease)   . History of gout   . History of nephrectomy    Left  . Hypertension   . Neuropathy   . Renal Cancer    Renal Cancer  . Renal cell carcinoma of left kidney (HCC)    mets to lungs  . Umbilical hernia     HOSPITAL COURSE:  62 year old male with stage IV renal cell carcinoma with lung mets, chronic cirrhosis due to EtOH, diabetes and hypertension who presented to the ER with drainage from abdominal wall.  1.  Hypovolemic shock due to large volume drainage from ascites fluid This has resolved and he is off of pressors albumin transfusion. There is no labs to confirm SBP.  He did receive Rocephin while in the hospital. His blood pressure medications with exception of his beta-blocker will be discontinued for now with close follow-up by PCP   2.  Diabetes; continue ADA diet and outpatient regimen  3.  Hyponatremia from liver cirrhosis and dehydration Sodium level has improved.  He will need close follow-up with nephrology for sodium level.    4.  Acute  kidney injury due to prerenal azotemia: Creatinine has improved.   5.  Stage IV renal cell carcinoma with metastatic disease: Patient is status post left nephrectomy He will have outpatient palliative care follow-up    6.  EtOH related cirrhosis and ascites: Status post paracentesis with 2 L of ascitic fluid removed.    7  Hyperkalemia: This was treated with Kayexalate, dextrose and insulin   DISCHARGE CONDITIONS AND DIET:   Stable for discharge on heart healthy diet  CONSULTS OBTAINED:  Treatment Team:  Lucilla Lame, MD Anthonette Legato, MD Sindy Guadeloupe, MD  DRUG ALLERGIES:  No Known Allergies  DISCHARGE MEDICATIONS:   Allergies as of 09/18/2017   No Known Allergies     Medication List    STOP taking these medications   amLODipine 5 MG tablet Commonly known as:  NORVASC   spironolactone 50 MG tablet Commonly known as:  ALDACTONE     TAKE these medications   acetaminophen 325 MG tablet Commonly known as:  TYLENOL Take 325 mg by mouth every 6 (six) hours as needed.   albuterol 108 (90 Base) MCG/ACT inhaler Commonly known as:  PROVENTIL HFA;VENTOLIN HFA Inhale 1-2 puffs into the lungs every 6 (six) hours as needed for wheezing or shortness of breath.   allopurinol 100 MG tablet Commonly known as:  ZYLOPRIM Take 1 tablet (100 mg total) by mouth daily.   ALPRAZolam 1 MG  tablet Commonly known as:  XANAX Take 1 mg by mouth as needed for sleep.   aspirin 81 MG chewable tablet Chew 81 mg by mouth daily.   colchicine 0.6 MG tablet Take 0.6 mg by mouth every other day.   feeding supplement (ENSURE ENLIVE) Liqd Take 237 mLs by mouth 3 (three) times daily between meals.   fluticasone 50 MCG/ACT nasal spray Commonly known as:  FLONASE   gabapentin 100 MG capsule Commonly known as:  NEURONTIN Take 2 capsules (200 mg total) by mouth 3 (three) times daily. What changed:  when to take this   insulin aspart 100 UNIT/ML FlexPen Commonly known as:   NOVOLOG Before meals 131-180 2 units, 181-240 4 units, 241-300 6 units, 301-350 8 units, 351-400 10 units, >400 12 units   Insulin Detemir 100 UNIT/ML Pen Commonly known as:  LEVEMIR Inject 40 Units into the skin daily at 10 pm.   lovastatin 20 MG tablet Commonly known as:  MEVACOR Take 1 tablet (20 mg total) by mouth daily at 6 PM.   Nebivolol HCl 20 MG Tabs Take 20 mg by mouth daily.   ONE TOUCH ULTRA TEST test strip Generic drug:  glucose blood Bid   tamsulosin 0.4 MG Caps capsule Commonly known as:  FLOMAX Take 0.4 mg by mouth every other day.         Today   CHIEF COMPLAINT:   Patient is ready to be discharged home today   VITAL SIGNS:  Blood pressure 114/73, pulse 61, temperature 97.7 F (36.5 C), temperature source Oral, resp. rate 18, height 5\' 11"  (1.803 m), weight 31.3 kg, SpO2 100 %.   REVIEW OF SYSTEMS:  Review of Systems  Constitutional: Negative.  Negative for chills, fever and malaise/fatigue.  HENT: Negative.  Negative for ear discharge, ear pain, hearing loss, nosebleeds and sore throat.   Eyes: Negative.  Negative for blurred vision and pain.  Respiratory: Negative.  Negative for cough, hemoptysis, shortness of breath and wheezing.   Cardiovascular: Negative.  Negative for chest pain, palpitations and leg swelling.  Gastrointestinal: Negative.  Negative for abdominal pain, blood in stool, diarrhea, nausea and vomiting.  Genitourinary: Negative.  Negative for dysuria.  Musculoskeletal: Negative.  Negative for back pain.  Skin: Negative.   Neurological: Negative for dizziness, tremors, speech change, focal weakness, seizures and headaches.  Endo/Heme/Allergies: Negative.  Does not bruise/bleed easily.  Psychiatric/Behavioral: Negative.  Negative for depression, hallucinations and suicidal ideas.     PHYSICAL EXAMINATION:  GENERAL:  62 y.o.-year-old patient lying in the bed with no acute distress.  NECK:  Supple, no jugular venous distention.  No thyroid enlargement, no tenderness.  LUNGS: Normal breath sounds bilaterally, no wheezing, rales,rhonchi  No use of accessory muscles of respiration.  CARDIOVASCULAR: S1, S2 normal. No murmurs, rubs, or gallops.  ABDOMEN: Soft, non-tender, non-distended. Bowel sounds present. No organomegaly or mass.  EXTREMITIES: No pedal edema, cyanosis, or clubbing.  PSYCHIATRIC: The patient is alert and oriented x 3.  SKIN: No obvious rash, lesion, or ulcer.   DATA REVIEW:   CBC Recent Labs  Lab 09/17/17 0331  WBC 3.8  HGB 9.6*  HCT 28.3*  PLT 68*    Chemistries  Recent Labs  Lab 09/16/17 0532 09/16/17 0537  09/18/17 0453  NA  --  126*   < > 130*  K  --  5.5*   < > 4.2  CL  --  95*   < > 97*  CO2  --  25   < >  26  GLUCOSE  --  243*   < > 180*  BUN  --  33*   < > 49*  CREATININE  --  1.59*   < > 1.16  CALCIUM  --  7.5*   < > 8.1*  MG 1.8  --   --   --   AST  --  26  --   --   ALT  --  14  --   --   ALKPHOS  --  212*  --   --   BILITOT  --  3.2*  --   --    < > = values in this interval not displayed.    Cardiac Enzymes No results for input(s): TROPONINI in the last 168 hours.  Microbiology Results  @[email or web address] @  RADIOLOGY:  US Paracentesis  Result Date: 09/16/2017 CLINICAL DATA:  Cirrhosis, recurrent large volume abdominal ascites. He had some skin breakdown of his umbilical hernia resulting in leakage of a fair amount of peritoneal fluid while at home. EXAM: ULTRASOUND GUIDED PARACENTESIS TECHNIQUE: The procedure, risks (including but not limited to bleeding, infection, organ damage ), benefits, and alternatives were explained to the patient. Questions regarding the procedure were encouraged and answered. The patient understands and consents to the procedure. Survey ultrasound of the abdomen was performed and an appropriate skin entry site in the lateral right lower abdomen was selected. Skin site was marked, prepped with chlorhexadine, draped in usual sterile fashion, and  infiltrated locally with 1% lidocaine. A Safe-T-Centesis needle was advanced into the peritoneal space until fluid could be aspirated. The sheath was advanced and the needle removed. 2 L of clear yellow ascites were aspirated. The patient tolerated the procedure well. COMPLICATIONS: COMPLICATIONS none IMPRESSION: Technically successful ultrasound guided paracentesis, removing 2 L ascites. Electronically Signed   By: Lucrezia Europe M.D.   On: 09/16/2017 12:54      Allergies as of 09/18/2017   No Known Allergies     Medication List    STOP taking these medications   amLODipine 5 MG tablet Commonly known as:  NORVASC   spironolactone 50 MG tablet Commonly known as:  ALDACTONE     TAKE these medications   acetaminophen 325 MG tablet Commonly known as:  TYLENOL Take 325 mg by mouth every 6 (six) hours as needed.   albuterol 108 (90 Base) MCG/ACT inhaler Commonly known as:  PROVENTIL HFA;VENTOLIN HFA Inhale 1-2 puffs into the lungs every 6 (six) hours as needed for wheezing or shortness of breath.   allopurinol 100 MG tablet Commonly known as:  ZYLOPRIM Take 1 tablet (100 mg total) by mouth daily.   ALPRAZolam 1 MG tablet Commonly known as:  XANAX Take 1 mg by mouth as needed for sleep.   aspirin 81 MG chewable tablet Chew 81 mg by mouth daily.   colchicine 0.6 MG tablet Take 0.6 mg by mouth every other day.   feeding supplement (ENSURE ENLIVE) Liqd Take 237 mLs by mouth 3 (three) times daily between meals.   fluticasone 50 MCG/ACT nasal spray Commonly known as:  FLONASE   gabapentin 100 MG capsule Commonly known as:  NEURONTIN Take 2 capsules (200 mg total) by mouth 3 (three) times daily. What changed:  when to take this   insulin aspart 100 UNIT/ML FlexPen Commonly known as:  NOVOLOG Before meals 131-180 2 units, 181-240 4 units, 241-300 6 units, 301-350 8 units, 351-400 10 units, >400 12 units   Insulin Detemir 100 UNIT/ML Pen Commonly known  as:  LEVEMIR Inject 40  Units into the skin daily at 10 pm.   lovastatin 20 MG tablet Commonly known as:  MEVACOR Take 1 tablet (20 mg total) by mouth daily at 6 PM.   Nebivolol HCl 20 MG Tabs Take 20 mg by mouth daily.   ONE TOUCH ULTRA TEST test strip Generic drug:  glucose blood Bid   tamsulosin 0.4 MG Caps capsule Commonly known as:  FLOMAX Take 0.4 mg by mouth every other day.          Management plans discussed with the patient and he is in agreement. Stable for discharge home with [facility] and outpatient palliative care  Patient should follow up with PCP  CODE STATUS:     Code Status Orders  (From admission, onward)         Start     Ordered   09/16/17 1346  Do not attempt resuscitation (DNR)  Continuous    Question Answer Comment  In the event of cardiac or respiratory ARREST Do not call a "code blue"   In the event of cardiac or respiratory ARREST Do not perform Intubation, CPR, defibrillation or ACLS   In the event of cardiac or respiratory ARREST Use medication by any route, position, wound care, and other measures to relive pain and suffering. May use oxygen, suction and manual treatment of airway obstruction as needed for comfort.      09/16/17 1346        Code Status History    Date Active Date Inactive Code Status Order ID Comments User Context   09/14/2017 0254 09/16/2017 1346 Full Code [id number]  Amelia Jo, MD Inpatient   01/25/2017 1037 01/27/2017 1704 Full Code [id number]  Bettey Costa, MD ED   09/30/2015 1609 10/03/2015 1530 Full Code [id number]  Demetrios Loll, MD Inpatient      TOTAL TIME TAKING CARE OF THIS PATIENT: 38 minutes.    Note: This dictation was prepared with Dragon dictation along with smaller phrase technology. Any transcriptional errors that result from this process are unintentional.  Jenicka Coxe M.D on 09/18/2017 at 9:48 AM  Between 7am to 6pm - Pager - [phone] After 6pm go to [email or web address] - password [email or web address] [facility]  Office   [phone]  CC: Primary care physician; [name], [name], MD

## 2017-09-18 NOTE — Care Management Important Message (Signed)
Copy of signed IM left with patient in room.  

## 2017-09-19 ENCOUNTER — Telehealth: Payer: Self-pay | Admitting: Internal Medicine

## 2017-09-19 ENCOUNTER — Telehealth: Payer: Self-pay | Admitting: *Deleted

## 2017-09-19 NOTE — Telephone Encounter (Signed)
Patient returned call

## 2017-09-19 NOTE — Telephone Encounter (Signed)
Patient is returning call, stated he is home and available. Please advise

## 2017-09-19 NOTE — Telephone Encounter (Signed)
Hospice needs verbal order for Palliative care. Steffanie Dunn [phone]

## 2017-09-19 NOTE — Telephone Encounter (Signed)
First attempt made for TCM  , no answer left message .

## 2017-09-19 NOTE — Telephone Encounter (Signed)
Transition Care Management Follow-up Telephone Call  How have you been since you were released from the hospital? Patient says he is feeling better ,    Do you understand why you were in the hospital? yes   Do you understand the discharge instrcutions? yes  Items Reviewed:  Medications reviewed: yes  Allergies reviewed: yes  Dietary changes reviewed: yes  Referrals reviewed: yes   Functional Questionnaire:  Activities of Daily Living (ADLs):   He states they are independent in the following: ambulation, bathing and hygiene, feeding, continence, grooming, toileting and dressing States they require assistance with the following: No assistance at this time.   Any transportation issues/concerns?: no   Any patient concerns? no   Confirmed importance and date/time of follow-up visits scheduled: Yes   Confirmed with patient if condition begins to worsen call PCP or go to the ER.  Patient was given the Call-a-Nurse line [phone]: yes

## 2017-09-20 ENCOUNTER — Emergency Department: Payer: Medicare HMO

## 2017-09-20 ENCOUNTER — Other Ambulatory Visit: Payer: Self-pay

## 2017-09-20 ENCOUNTER — Emergency Department
Admission: EM | Admit: 2017-09-20 | Discharge: 2017-09-20 | Disposition: A | Discharge status: Home / Self Care | Payer: Medicare HMO | Attending: Emergency Medicine | Admitting: Emergency Medicine

## 2017-09-20 DIAGNOSIS — K7031 Alcoholic cirrhosis of liver with ascites: Secondary | ICD-10-CM

## 2017-09-20 DIAGNOSIS — Z794 Long term (current) use of insulin: Secondary | ICD-10-CM | POA: Insufficient documentation

## 2017-09-20 DIAGNOSIS — Z7982 Long term (current) use of aspirin: Secondary | ICD-10-CM | POA: Diagnosis not present

## 2017-09-20 DIAGNOSIS — K429 Umbilical hernia without obstruction or gangrene: Secondary | ICD-10-CM | POA: Insufficient documentation

## 2017-09-20 DIAGNOSIS — N183 Chronic kidney disease, stage 3 (moderate): Secondary | ICD-10-CM | POA: Diagnosis not present

## 2017-09-20 DIAGNOSIS — R1030 Lower abdominal pain, unspecified: Secondary | ICD-10-CM | POA: Diagnosis not present

## 2017-09-20 DIAGNOSIS — I129 Hypertensive chronic kidney disease with stage 1 through stage 4 chronic kidney disease, or unspecified chronic kidney disease: Secondary | ICD-10-CM | POA: Diagnosis not present

## 2017-09-20 DIAGNOSIS — Z87891 Personal history of nicotine dependence: Secondary | ICD-10-CM | POA: Diagnosis not present

## 2017-09-20 DIAGNOSIS — E119 Type 2 diabetes mellitus without complications: Secondary | ICD-10-CM | POA: Diagnosis not present

## 2017-09-20 DIAGNOSIS — R69 Illness, unspecified: Secondary | ICD-10-CM | POA: Diagnosis not present

## 2017-09-20 DIAGNOSIS — Z79899 Other long term (current) drug therapy: Secondary | ICD-10-CM | POA: Diagnosis not present

## 2017-09-20 DIAGNOSIS — I2510 Atherosclerotic heart disease of native coronary artery without angina pectoris: Secondary | ICD-10-CM | POA: Insufficient documentation

## 2017-09-20 DIAGNOSIS — K7460 Unspecified cirrhosis of liver: Secondary | ICD-10-CM | POA: Diagnosis not present

## 2017-09-20 LAB — COMPREHENSIVE METABOLIC PANEL
ALT: 32 U/L (ref 0–44)
ANION GAP: 7 (ref 5–15)
AST: 37 U/L (ref 15–41)
Albumin: 2.6 g/dL — ABNORMAL LOW (ref 3.5–5.0)
Alkaline Phosphatase: 391 U/L — ABNORMAL HIGH (ref 38–126)
BUN: 32 mg/dL — ABNORMAL HIGH (ref 8–23)
CO2: 27 mmol/L (ref 22–32)
CREATININE: 1.15 mg/dL (ref 0.61–1.24)
Calcium: 7.9 mg/dL — ABNORMAL LOW (ref 8.9–10.3)
Chloride: 94 mmol/L — ABNORMAL LOW (ref 98–111)
GFR calc Af Amer: >60 mL/min (ref >60)
Glucose, Bld: 194 mg/dL — ABNORMAL HIGH (ref 70–99)
Potassium: 4.8 mmol/L (ref 3.5–5.1)
Sodium: 128 mmol/L — ABNORMAL LOW (ref 135–145)
Total Bilirubin: 3.9 mg/dL — ABNORMAL HIGH (ref 0.3–1.2)
Total Protein: 5.8 g/dL — ABNORMAL LOW (ref 6.5–8.1)

## 2017-09-20 LAB — CBC
HCT: 37.5 % — ABNORMAL LOW (ref 40.0–52.0)
HEMOGLOBIN: 12.7 g/dL — AB (ref 13.0–18.0)
MCH: 28.4 pg (ref 26.0–34.0)
MCHC: 33.9 g/dL (ref 32.0–36.0)
MCV: 83.7 fL (ref 80.0–100.0)
Platelets: 125 10*3/uL — ABNORMAL LOW (ref 150–440)
RBC: 4.48 MIL/uL (ref 4.40–5.90)
RDW: 15.8 % — ABNORMAL HIGH (ref 11.5–14.5)
WBC: 7.6 10*3/uL (ref 3.8–10.6)

## 2017-09-20 LAB — LIPASE, BLOOD: Lipase: 40 U/L (ref 11–51)

## 2017-09-20 MED ORDER — FENTANYL CITRATE (PF) 100 MCG/2ML IJ SOLN
50.0000 ug | Freq: Once | INTRAMUSCULAR | Status: AC
  Administered 2017-09-20: 50 ug via INTRAVENOUS
  Filled 2017-09-20: qty 2

## 2017-09-20 MED ORDER — OXYCODONE HCL 5 MG PO TABS: 5.0000 mg | ORAL_TABLET | Freq: Four times a day (QID) | ORAL | 0 refills | Status: DC | PRN

## 2017-09-20 MED ORDER — SODIUM CHLORIDE 0.9 % IV BOLUS
500.0000 mL | Freq: Once | INTRAVENOUS | Status: AC
  Administered 2017-09-20: 500 mL via INTRAVENOUS

## 2017-09-20 MED ORDER — OXYCODONE HCL 5 MG PO TABS
ORAL_TABLET | ORAL | Status: AC
  Administered 2017-09-20: 5 mg via ORAL
  Filled 2017-09-20: qty 1

## 2017-09-20 MED ORDER — OXYCODONE HCL 5 MG PO TABS
5.0000 mg | ORAL_TABLET | ORAL | Status: AC
  Administered 2017-09-20: 5 mg via ORAL

## 2017-09-20 MED ORDER — IOPAMIDOL (ISOVUE-300) INJECTION 61%
100.0000 mL | Freq: Once | INTRAVENOUS | Status: AC | PRN
  Administered 2017-09-20: 100 mL via INTRAVENOUS
  Filled 2017-09-20: qty 100

## 2017-09-20 NOTE — ED Notes (Signed)
Pt given crackers and gingerale. Pt tolerated cup of water

## 2017-09-20 NOTE — ED Triage Notes (Signed)
Pt c/o lower abdominal pain that radiates across lower abdomen. Pt was released from hospital Wednesday after paracentesis. Pt has umbilical hernia that he has had for "years". Denies NVD.

## 2017-09-20 NOTE — Discharge Instructions (Signed)
Your CT scan today shows reaccumulating ascites and persistent umbilical hernia without any new complication.  Take oxycodone as prescribed to control pain and follow-up with your doctor as scheduled on Monday.  Return to the ED if your symptoms are worsening or if you have fever or inability to take anything by mouth.

## 2017-09-20 NOTE — ED Triage Notes (Signed)
First Nurse Note:  C/O severe abdominal pain.  AAOx3.  Skin warm and dry. NAD

## 2017-09-20 NOTE — ED Provider Notes (Signed)
[facility] Emergency Department Provider Note  ____________________________________________  Time seen: Approximately 10:38 PM  I have reviewed the triage vital signs and the nursing notes.   HISTORY  Chief Complaint Abdominal Pain    HPI DHRUVA ORNDOFF is a 62 y.o. male with a history of metastatic renal cell carcinoma, currently undergoing chemotherapy, hypertension, diabetes, alcoholic cirrhosis, hepatorenal syndrome who comes to the ED complaining of abdominal pain across the lower abdomen since yesterday, gradual onset, constant.  Nonradiating, no aggravating or alleviating factors.  Tolerating oral intake.  Was in the hospital over the past week, discharged 2 days ago after having a paracentesis.  During that time he was evaluated by general surgery who noted that he is not a surgical candidate for his umbilical hernia which is also complicated by a slow leak of ascites through a skin defect.  He has been referred to palliative care.     Past Medical History:  Diagnosis Date  . Anemia   . BPH (benign prostatic hyperplasia)   . CKD (chronic kidney disease) stage 3, GFR 30-59 ml/min (HCC)   . Clear cell renal cell carcinoma ([place]) 2014   Left Nephrectomy.  . Colon polyps   . Diabetes mellitus without complication ([place])    type 2   . Dyspnea    with exertion  . GERD (gastroesophageal reflux disease)   . History of gout   . History of nephrectomy    Left  . Hypertension   . Neuropathy   . Renal Cancer    Renal Cancer  . Renal cell carcinoma of left kidney (HCC)    mets to lungs  . Umbilical hernia      Patient Active Problem List   Diagnosis Date Noted  . Protein-calorie malnutrition, severe 09/15/2017  . Acute hyponatremia 09/14/2017  . Ascites   . Hyponatremia 05/28/2017  . Colon polyps 04/26/2017  . Umbilical hernia 25/42/7062  . Left inguinal hernia 04/26/2017  . Gout 04/26/2017  . Fatty liver 04/26/2017  . Leukopenia 04/26/2017   . Gallstones 04/26/2017  . Type 2 diabetes mellitus with diabetic neuropathy, unspecified ([place]) 04/26/2017  . CAD (coronary artery disease) 04/26/2017  . Chronic knee pain 04/26/2017  . Protein-calorie malnutrition ([place]) 04/26/2017  . Cirrhosis ([place]) 02/17/2017  . Neutropenia associated with infection ([place])   . Malignant neoplasm of kidney ([place])   . Thrombocytopenia ([place])   . Iron deficiency anemia 01/23/2017  . Goals of care, counseling/discussion 11/27/2016  . BPH (benign prostatic hyperplasia) 10/27/2016  . Effusion of left olecranon bursa 10/27/2016  . Foreign body in left ear 10/27/2016  . Pancytopenia ([place]) 10/27/2016  . Alcohol abuse 08/28/2016  . Nonimmune to hepatitis B virus 08/02/2016  . Metastatic renal cell carcinoma to lung ([place])   . ARF (acute renal failure) ([place]) 09/30/2015  . Type 2 diabetes mellitus with moderate nonproliferative diabetic retinopathy and without macular edema ([place]) 09/17/2014  . Cataract 08/20/2014  . Hypertension 07/02/2014  . CKD (chronic kidney disease) stage 3, GFR 30-59 ml/min (HCC) 11/30/2013  . Single kidney 02/04/2013  . Renal cell carcinoma ([place]) 01/02/2012     Past Surgical History:  Procedure Laterality Date  . BACK SURGERY     ruptured disc 1991   . ENDOBRONCHIAL ULTRASOUND N/A 10/14/2015   Procedure: ENDOBRONCHIAL ULTRASOUND;  Surgeon: [name], MD;  Location: [facility] ORS;  Service: Pulmonary;  Laterality: N/A;  . ESOPHAGOGASTRODUODENOSCOPY (EGD) WITH PROPOFOL N/A 04/15/2017   Procedure: ESOPHAGOGASTRODUODENOSCOPY (EGD) WITH PROPOFOL;  Surgeon: Vicente Males,  Bailey Mech, MD;  Location: [facility];  Service: Gastroenterology;  Laterality: N/A;  Screen for esophageal varices  . EYE SURGERY Bilateral    Cataract Extraction with IOL  . KNEE SURGERY Right   . NEPHRECTOMY     left kidney 2014 renal cell cancer      Prior to Admission medications   Medication Sig Start Date End Date Taking? Authorizing Provider  acetaminophen  (TYLENOL) 325 MG tablet Take 325 mg by mouth every 6 (six) hours as needed.    [provider]  albuterol (PROVENTIL HFA;VENTOLIN HFA) 108 (90 Base) MCG/ACT inhaler Inhale 1-2 puffs into the lungs every 6 (six) hours as needed for wheezing or shortness of breath. Patient not taking: Reported on 09/14/2017 05/28/17   [name], [name], MD  allopurinol (ZYLOPRIM) 100 MG tablet Take 1 tablet (100 mg total) by mouth daily. 08/27/17   [name], [name], MD  ALPRAZolam Duanne Moron) 1 MG tablet Take 1 mg by mouth as needed for sleep. 06/10/16   [provider]  aspirin 81 MG chewable tablet Chew 81 mg by mouth daily.    [provider]  colchicine 0.6 MG tablet Take 0.6 mg by mouth every other day.  06/26/15   [provider]  feeding supplement, ENSURE ENLIVE, (ENSURE ENLIVE) LIQD Take 237 mLs by mouth 3 (three) times daily between meals. 09/18/17   Bettey Costa, MD  fluticasone (FLONASE) 50 MCG/ACT nasal spray  03/26/17   [provider]  gabapentin (NEURONTIN) 100 MG capsule Take 2 capsules (200 mg total) by mouth 3 (three) times daily. Patient taking differently: Take 200 mg by mouth 2 (two) times daily.  04/26/17   [name], [name], MD  insulin aspart (NOVOLOG) 100 UNIT/ML FlexPen Before meals 131-180 2 units, 181-240 4 units, 241-300 6 units, 301-350 8 units, 351-400 10 units, >400 12 units 04/26/17   [name], [name], MD  Insulin Detemir (LEVEMIR FLEXTOUCH) 100 UNIT/ML Pen Inject 40 Units into the skin daily at 10 pm. 05/28/17   [name], [name], MD  lovastatin (MEVACOR) 20 MG tablet Take 1 tablet (20 mg total) by mouth daily at 6 PM. 08/08/17   [name], [name], MD  Nebivolol HCl 20 MG TABS Take 20 mg by mouth daily.    [provider]  ONE TOUCH ULTRA TEST test strip Bid 05/28/17   [name], [name], MD  oxyCODONE (ROXICODONE) 5 MG immediate release tablet Take 1 tablet (5 mg total) by mouth every 6 (six) hours  as needed for breakthrough pain. 09/20/17   Carrie Mew, MD  tamsulosin (FLOMAX) 0.4 MG CAPS capsule Take 0.4 mg by mouth every other day.  08/02/15   [provider]     Allergies Patient has no known allergies.   Family History  Problem Relation Age of Onset  . Ovarian cancer Mother   . Diabetes Father   . Hypertension Father     Social History Social History   Tobacco Use  . Smoking status: Former Smoker    Packs/day: 1.00    Types: Cigarettes    Last attempt to quit: 04/15/1993    Years since quitting: 24.4  . Smokeless tobacco: Never Used  Substance Use Topics  . Alcohol use: Yes    Alcohol/week: 42.0 standard drinks    Types: 42 Cans of beer per week    Comment: 2-3 beers every other day  . Drug use: No    Review of Systems  Constitutional:   No fever or chills.  ENT:   No sore throat. No rhinorrhea. Cardiovascular:   No chest pain or syncope. Respiratory:   No dyspnea or cough. Gastrointestinal:   Positive as above for abdominal pain without vomiting and diarrhea.  Normal bowel movement yesterday Musculoskeletal:   Negative for focal pain or swelling All other systems reviewed and are negative except as documented above in ROS and HPI.  ____________________________________________   PHYSICAL EXAM:  VITAL SIGNS: ED Triage Vitals  Enc Vitals Group     BP 09/20/17 1707 98/70     Pulse Rate 09/20/17 1707 80     Resp 09/20/17 1707 18     Temp 09/20/17 1707 98.7 F (37.1 C)     Temp Source 09/20/17 1707 Oral     SpO2 09/20/17 1707 99 %     Weight 09/20/17 1707 150 lb (68 kg)     Height 09/20/17 1707 5\' 11"  (1.803 m)     Head Circumference --      Peak Flow --      Pain Score 09/20/17 1726 10     Pain Loc --      Pain Edu? --      Excl. in [place]? --     Vital signs reviewed, nursing assessments reviewed.   Constitutional:   Alert and oriented.  Chronically ill-appearing, not in distress.  Cachectic. Eyes:   Conjunctivae are normal.  EOMI. PERRL. ENT      Head:   Normocephalic and atraumatic.      Nose:   No congestion/rhinnorhea.       Mouth/Throat:   Dry mucous membranes, no pharyngeal erythema. No peritonsillar mass.       Neck:   No meningismus. Full ROM. Hematological/Lymphatic/Immunilogical:   No cervical lymphadenopathy. Cardiovascular:   RRR. Symmetric bilateral radial and DP pulses.  No murmurs. Cap refill less than 2 seconds. Respiratory:   Normal respiratory effort without tachypnea/retractions. Breath sounds are clear and equal bilaterally. No wheezes/rales/rhonchi. Gastrointestinal:   Soft and nontender.  Significantly distended.  Large umbilical hernia which is soft but tender to the touch..   No rebound, rigidity, or guarding. Musculoskeletal:   Normal range of motion in all extremities. No joint effusions.  No lower extremity tenderness.  No edema. Neurologic:   Normal speech and language.  Motor grossly intact. No acute focal neurologic deficits are appreciated.  Skin:    Skin is warm, dry and intact. No rash noted.  No petechiae, purpura, or bullae.  ____________________________________________    LABS (pertinent positives/negatives) (all labs ordered are listed, but only abnormal results are displayed) Labs Reviewed  COMPREHENSIVE METABOLIC PANEL - Abnormal; Notable for the following components:      Result Value   Sodium 128 (*)    Chloride 94 (*)    Glucose, Bld 194 (*)    BUN 32 (*)    Calcium 7.9 (*)    Total Protein 5.8 (*)    Albumin 2.6 (*)    Alkaline Phosphatase 391 (*)    Total Bilirubin 3.9 (*)    All other components within normal limits  CBC - Abnormal; Notable for the following components:   Hemoglobin 12.7 (*)    HCT 37.5 (*)    RDW 15.8 (*)    Platelets 125 (*)    All other components within normal limits  LIPASE, BLOOD  URINALYSIS, COMPLETE (UACMP) WITH MICROSCOPIC    ____________________________________________   EKG    ____________________________________________    RADIOLOGY  Ct Abdomen Pelvis W Contrast  Result Date: 09/20/2017 CLINICAL DATA:  Bilateral lower abdominal pain radiating across the lower abdomen. Patient was released from hospital Wednesday after paracentesis. Patient has had umbilical hernia for years. EXAM: CT ABDOMEN AND PELVIS WITH CONTRAST TECHNIQUE: Multidetector CT imaging of the abdomen and pelvis was performed using the standard protocol following bolus administration of intravenous contrast. CONTRAST:  150mL ISOVUE-300 IOPAMIDOL (ISOVUE-300) INJECTION 61% COMPARISON:  09/13/2017 CT FINDINGS: Lower chest: Normal heart size without pericardial effusion. Redemonstration of dominant pulmonary mass in the left lower lobe measuring approximately 2.7 x 2.4 cm allowing for slice sampling differences in operator dependent imaging. Smaller scattered pulmonary nodules are also present within both lower lobes. Trace right greater than left pleural effusions. Hepatobiliary: Cirrhotic appearance of the liver with heterogeneous somewhat wedge-shaped area of hypo density involving the medial segment of left hepatic lobe but without definable mass. Nondistended gallbladder with layering gallstones. No biliary dilatation. Pancreas: Unremarkable Spleen: Enlarged up to 16.5 cm without focal mass stable since recent. Adrenals/Urinary Tract: Surgically absent left adrenal gland and kidney. The right adrenal gland and right kidney are unremarkable. Stomach/Bowel: The stomach is decompressed in appearance. The duodenal sweep and ligament of Treitz are normal in position. No small bowel dilatation or obstruction is identified. Mildly edematous appearance of the small bowel likely sympathetic from surrounding ascites. Likewise there is somewhat thickened appearance of the cecum and ascending colon since prior with submucosal fatty change. Possibility of a  colitis is not entirely excluded. Centralized loops of small bowel secondary to large volume of ascites. No bowel obstruction. Vascular/Lymphatic: Nonaneurysmal thoracic aorta with atherosclerosis. No adenopathy. Multiple upper abdominal varices. Reproductive: Mildly enlarged prostate. Other: Ventral hernia containing loculated fluid is again redemonstrated without definite herniation of bowel given lack of more proximal bowel this measures 10.6 x 9.6 x 2.7 cm (transverse by CC by AP dimension) with the mouth of hernia measuring 4.2 x 1.7 cm in transverse by craniocaudad. Previously this hernia was estimated at 9.5 cm transverse by 2.7 cm in thickness. Prominent enhancing omental vessels project into the hernia as before. Moderate to large volume of ascites redemonstrated. Musculoskeletal: Degenerative disc disease L3-4 and L4-5. IMPRESSION: 1. Moderate to large volume of ascites despite recent paracentesis suggests reaccumulation of fluid given similar appearance of the abdomen and pelvis 09/13/2017 (patient had a paracentesis performed on 09/16/2017). 2. Cirrhotic liver with splenomegaly and stigmata of portal hypertension. 3. Slightly wedge shaped perfusion anomaly of the medial segment of left hepatic lobe similar to prior. A mass cannot be entirely excluded. 4. Stable pulmonary nodules with dominant mass in left lower lobe. 5. Large multiloculated fluid containing ventral hernia with herniation of omental vessels as before currently estimated at 10.6 x 9.6 x 2.7 cm. This appears more prominent than on prior. Electronically Signed   By: Ashley Royalty M.D.   On: 09/20/2017 21:01    ____________________________________________   PROCEDURES Procedures  ____________________________________________  DIFFERENTIAL DIAGNOSIS   Incarcerated hernia, abdominal wall infection, paracentesis complication  CLINICAL IMPRESSION / ASSESSMENT AND PLAN / ED COURSE  Pertinent labs & imaging results that were available  during my care of the patient were reviewed by me and considered in my medical decision making (see chart for details).    Patient not in distress, vital signs are normal, complains of abdominal pain.  Recently had paracentesis.  He is chronically severely ill.  I will obtain a CT scan to evaluate for incarcerated hernia versus complication from the procedure he recently had.  I doubt SBP  given his normal vital signs and white blood cell count.  He does not have peritonitis on exam.  ----------------------------------------- 10:42 PM on 09/20/2017 -----------------------------------------  Patient reports he feels much better after receiving some pain control.  CT is overall unremarkable and unchanged from previous.  He does have reticulation of ascites which I think is just symptomatic causing abdominal pain.  He can follow-up outpatient for this at his not causing any respiratory compromise and it is controllable with pain medicines.  Due to his liver failure and kidney failure, opioids are the best choice for him.  I will start him on oxycodone.  Controlled substance reporting system shows him to be low risk for opioid abuse, his history of alcohol abuse not withstanding.  I feel that his presentation represents end-stage disease, and with his referral to palliative medicine, the risk of addiction is acceptable with the goal of restoring some quality of life through pain control.  ____________________________________________   FINAL CLINICAL IMPRESSION(S) / ED DIAGNOSES    Final diagnoses:  Ascites due to alcoholic cirrhosis ([place])  Umbilical hernia without obstruction and without gangrene     ED Discharge Orders         Ordered    oxyCODONE (ROXICODONE) 5 MG immediate release tablet  Every 6 hours PRN     09/20/17 2236          Portions of this note were generated with dragon dictation software. Dictation errors may occur despite best attempts at proofreading.    Carrie Mew, MD 09/20/17 2244

## 2017-09-23 ENCOUNTER — Other Ambulatory Visit: Payer: Self-pay

## 2017-09-23 ENCOUNTER — Ambulatory Visit (INDEPENDENT_AMBULATORY_CARE_PROVIDER_SITE_OTHER): Payer: Medicare HMO | Admitting: Internal Medicine

## 2017-09-23 ENCOUNTER — Ambulatory Visit (INDEPENDENT_AMBULATORY_CARE_PROVIDER_SITE_OTHER): Payer: Medicare HMO | Admitting: Gastroenterology

## 2017-09-23 ENCOUNTER — Encounter: Payer: Self-pay | Admitting: Internal Medicine

## 2017-09-23 ENCOUNTER — Telehealth: Payer: Self-pay

## 2017-09-23 ENCOUNTER — Encounter: Payer: Self-pay | Admitting: Gastroenterology

## 2017-09-23 VITALS — BP 92/60 | HR 94 | Temp 98.2°F | Ht 71.0 in | Wt 160.6 lb

## 2017-09-23 VITALS — BP 103/68 | HR 91 | Ht 71.0 in | Wt 160.8 lb

## 2017-09-23 DIAGNOSIS — T148XXA Other injury of unspecified body region, initial encounter: Secondary | ICD-10-CM

## 2017-09-23 DIAGNOSIS — E1165 Type 2 diabetes mellitus with hyperglycemia: Secondary | ICD-10-CM | POA: Diagnosis not present

## 2017-09-23 DIAGNOSIS — N5089 Other specified disorders of the male genital organs: Secondary | ICD-10-CM | POA: Diagnosis not present

## 2017-09-23 DIAGNOSIS — D696 Thrombocytopenia, unspecified: Secondary | ICD-10-CM

## 2017-09-23 DIAGNOSIS — K1230 Oral mucositis (ulcerative), unspecified: Secondary | ICD-10-CM

## 2017-09-23 DIAGNOSIS — K7460 Unspecified cirrhosis of liver: Secondary | ICD-10-CM | POA: Diagnosis not present

## 2017-09-23 DIAGNOSIS — B009 Herpesviral infection, unspecified: Secondary | ICD-10-CM | POA: Diagnosis not present

## 2017-09-23 DIAGNOSIS — Z23 Encounter for immunization: Secondary | ICD-10-CM

## 2017-09-23 DIAGNOSIS — C7802 Secondary malignant neoplasm of left lung: Secondary | ICD-10-CM

## 2017-09-23 DIAGNOSIS — K429 Umbilical hernia without obstruction or gangrene: Secondary | ICD-10-CM

## 2017-09-23 DIAGNOSIS — Z794 Long term (current) use of insulin: Secondary | ICD-10-CM | POA: Diagnosis not present

## 2017-09-23 DIAGNOSIS — S70312A Abrasion, left thigh, initial encounter: Secondary | ICD-10-CM | POA: Diagnosis not present

## 2017-09-23 DIAGNOSIS — E871 Hypo-osmolality and hyponatremia: Secondary | ICD-10-CM

## 2017-09-23 DIAGNOSIS — R69 Illness, unspecified: Secondary | ICD-10-CM | POA: Diagnosis not present

## 2017-09-23 DIAGNOSIS — K7031 Alcoholic cirrhosis of liver with ascites: Secondary | ICD-10-CM

## 2017-09-23 DIAGNOSIS — N433 Hydrocele, unspecified: Secondary | ICD-10-CM

## 2017-09-23 DIAGNOSIS — E113399 Type 2 diabetes mellitus with moderate nonproliferative diabetic retinopathy without macular edema, unspecified eye: Secondary | ICD-10-CM

## 2017-09-23 DIAGNOSIS — R188 Other ascites: Secondary | ICD-10-CM

## 2017-09-23 DIAGNOSIS — S70311A Abrasion, right thigh, initial encounter: Secondary | ICD-10-CM | POA: Diagnosis not present

## 2017-09-23 DIAGNOSIS — C649 Malignant neoplasm of unspecified kidney, except renal pelvis: Secondary | ICD-10-CM

## 2017-09-23 DIAGNOSIS — K7011 Alcoholic hepatitis with ascites: Secondary | ICD-10-CM

## 2017-09-23 LAB — POCT GLYCOSYLATED HEMOGLOBIN (HGB A1C): HEMOGLOBIN A1C: 8.9 % — AB (ref 4.0–5.6)

## 2017-09-23 MED ORDER — INSULIN PEN NEEDLE 32G X 4 MM MISC: 1.0000 | Freq: Two times a day (BID) | 12 refills | Status: AC

## 2017-09-23 MED ORDER — SPIRONOLACTONE 50 MG PO TABS: 25.0000 mg | ORAL_TABLET | Freq: Every day | ORAL | 2 refills | Status: DC

## 2017-09-23 MED ORDER — MAGIC MOUTHWASH W/LIDOCAINE: 5.0000 mL | Freq: Three times a day (TID) | ORAL | 0 refills | Status: DC | PRN

## 2017-09-23 MED ORDER — VALACYCLOVIR HCL 1 G PO TABS: 1000.0000 mg | ORAL_TABLET | Freq: Two times a day (BID) | ORAL | 5 refills | Status: DC

## 2017-09-23 MED ORDER — MUPIROCIN 2 % EX OINT: 1.0000 "application " | TOPICAL_OINTMENT | Freq: Two times a day (BID) | CUTANEOUS | 0 refills | Status: DC

## 2017-09-23 NOTE — Progress Notes (Signed)
Chief Complaint  Patient presents with  . Follow-up   HFU  1. Hyponatremia hospitalized 9/13/-9/18 Na went down to 117-119 back to 128 on 09/28/17 had paracentesis due to cirrhosis with ascites and AKI. He went back to ED 09/20/17 for abdominal pain and was given fenatyl 50 mcg x 1 and Oxycodone which he has not taken  He also c/o scrotal swelling and pain left > right and has trouble walking due to scrotal swelling and wants to see urology. Scrotal swelling likely related to Cirrhosis  2. DM 2 A1C 8.89 today on levemir 35 units qd and SSI wants refill of BD ultrafine needles  3. C/o mouth irritation and oral blisters upper lip making if hard for him to eat  4. Fall 3 weeks ago off a ladder in garage trying to change light bulb with abrasions on b/l legs/thighs 5. Metastatic cancer renal cell rec was palliative care consult outpatient will defer to H/o for discussion with patient. He wants to keep fighting 6. Umbilical hernia he reports he had a hole in it and fluid was leaking  7. HTN h/o now with hypotension off spironolactone 50 mg and norvasc 5 mg qd     Review of Systems  Constitutional: Negative for weight loss.  HENT: Negative for hearing loss.   Eyes: Negative for blurred vision.  Respiratory: Negative for shortness of breath.   Cardiovascular: Negative for chest pain.  Gastrointestinal: Negative for abdominal pain.       +ascities    Genitourinary:       +scrotal swelling   Musculoskeletal: Positive for falls.  Skin:       +abrasions   Neurological: Negative for headaches.  Psychiatric/Behavioral: Negative for depression.   Past Medical History:  Diagnosis Date  . Anemia   . BPH (benign prostatic hyperplasia)   . CKD (chronic kidney disease) stage 3, GFR 30-59 ml/min (HCC)   . Clear cell renal cell carcinoma ([place]) 2014   Left Nephrectomy.  . Colon polyps   . Diabetes mellitus without complication ([place])    type 2   . Dyspnea    with exertion  . GERD  (gastroesophageal reflux disease)   . History of gout   . History of nephrectomy    Left  . Hypertension   . Neuropathy   . Renal Cancer    Renal Cancer  . Renal cell carcinoma of left kidney (HCC)    mets to lungs  . Umbilical hernia    Past Surgical History:  Procedure Laterality Date  . BACK SURGERY     ruptured disc 1991   . ENDOBRONCHIAL ULTRASOUND N/A 10/14/2015   Procedure: ENDOBRONCHIAL ULTRASOUND;  Surgeon: [name], MD;  Location: [facility] ORS;  Service: Pulmonary;  Laterality: N/A;  . ESOPHAGOGASTRODUODENOSCOPY (EGD) WITH PROPOFOL N/A 04/15/2017   Procedure: ESOPHAGOGASTRODUODENOSCOPY (EGD) WITH PROPOFOL;  Surgeon: Jonathon Bellows, MD;  Location[facility] ENDOSCOPY;  Service: Gastroenterology;  Laterality: N/A;  Screen for esophageal varices  . EYE SURGERY Bilateral    Cataract Extraction with IOL  . KNEE SURGERY Right   . NEPHRECTOMY     left kidney 2014 renal cell cancer    Family History  Problem Relation Age of Onset  . Ovarian cancer Mother   . Diabetes Father   . Hypertension Father    Social History   Socioeconomic History  . Marital status: Divorced    Spouse name: Not on file  . Number of children: Not on file  . Years of education: Not  on file  . Highest education level: Not on file  Occupational History  . Not on file  Social Needs  . Financial resource strain: Not hard at all  . Food insecurity:    Worry: Never true    Inability: Never true  . Transportation needs:    Medical: No    Non-medical: No  Tobacco Use  . Smoking status: Former Smoker    Packs/day: 1.00    Types: Cigarettes    Last attempt to quit: 04/15/1993    Years since quitting: 24.4  . Smokeless tobacco: Never Used  Substance and Sexual Activity  . Alcohol use: Yes    Alcohol/week: 42.0 standard drinks    Types: 42 Cans of beer per week    Comment: 2-3 beers every other day  . Drug use: No  . Sexual activity: Yes  Lifestyle  . Physical activity:    Days per week:  Patient refused    Minutes per session: Patient refused  . Stress: Patient refused  Relationships  . Social connections:    Talks on phone: Patient refused    Gets together: Patient refused    Attends religious service: Patient refused    Active member of club or organization: Patient refused    Attends meetings of clubs or organizations: Patient refused    Relationship status: Patient refused  . Intimate partner violence:    Fear of current or ex partner: Patient refused    Emotionally abused: Patient refused    Physically abused: Patient refused    Forced sexual activity: Patient refused  Other Topics Concern  . Not on file  Social History Narrative   HS graduate    Retired    Divorced    2 daughters    Lives alone    [name] to travel to [place]    1 living brother who is doctor in [place], wears seat belt    Current Meds  Medication Sig  . acetaminophen (TYLENOL) 325 MG tablet Take 325 mg by mouth every 6 (six) hours as needed.  Marland Kitchen albuterol (PROVENTIL HFA;VENTOLIN HFA) 108 (90 Base) MCG/ACT inhaler Inhale 1-2 puffs into the lungs every 6 (six) hours as needed for wheezing or shortness of breath.  . allopurinol (ZYLOPRIM) 100 MG tablet Take 1 tablet (100 mg total) by mouth daily.  Marland Kitchen ALPRAZolam (XANAX) 1 MG tablet Take 1 mg by mouth as needed for sleep.  Marland Kitchen aspirin 81 MG chewable tablet Chew 81 mg by mouth daily.  . colchicine 0.6 MG tablet Take 0.6 mg by mouth every other day.   . feeding supplement, ENSURE ENLIVE, (ENSURE ENLIVE) LIQD Take 237 mLs by mouth 3 (three) times daily between meals.  . fluticasone (FLONASE) 50 MCG/ACT nasal spray   . gabapentin (NEURONTIN) 100 MG capsule Take 2 capsules (200 mg total) by mouth 3 (three) times daily. (Patient taking differently: Take 200 mg by mouth 2 (two) times daily. )  . insulin aspart (NOVOLOG) 100 UNIT/ML FlexPen Before meals 131-180 2 units, 181-240 4 units, 241-300 6 units, 301-350 8 units, 351-400 10 units, >400  12 units  . Insulin Detemir (LEVEMIR FLEXTOUCH) 100 UNIT/ML Pen Inject 40 Units into the skin daily at 10 pm.  . lovastatin (MEVACOR) 20 MG tablet Take 1 tablet (20 mg total) by mouth daily at 6 PM.  . Nebivolol HCl 20 MG TABS Take 20 mg by mouth daily.  . ONE TOUCH ULTRA TEST test strip Bid  .  oxyCODONE (ROXICODONE) 5 MG immediate release tablet Take 1 tablet (5 mg total) by mouth every 6 (six) hours as needed for breakthrough pain.  . tamsulosin (FLOMAX) 0.4 MG CAPS capsule Take 0.4 mg by mouth every other day.    Current Facility-Administered Medications for the 09/23/17 encounter (Office Visit) with [name], [name], MD  Medication  . albumin human 25 % solution 12.5 g  . albumin human 25 % solution 12.5 g  . albumin human 5 % solution 25 g   No Known Allergies Recent Results (from the past 2160 hour(s))  CBC with Differential/Platelet     Status: Abnormal   Collection Time: 06/26/17  1:39 PM  Result Value Ref Range   WBC 2.8 (L) 3.8 - 10.6 K/uL   RBC 3.68 (L) 4.40 - 5.90 MIL/uL   Hemoglobin 10.3 (L) 13.0 - 18.0 g/dL   HCT 31.2 (L) 40.0 - 52.0 %   MCV 84.8 80.0 - 100.0 fL   MCH 28.0 26.0 - 34.0 pg   MCHC 33.0 32.0 - 36.0 g/dL   RDW 15.8 (H) 11.5 - 14.5 %   Platelets 109 (L) 150 - 440 K/uL   Neutrophils Relative % 59 %   Neutro Abs 1.6 1.4 - 6.5 K/uL   Lymphocytes Relative 17 %   Lymphs Abs 0.5 (L) 1.0 - 3.6 K/uL   Monocytes Relative 13 %   Monocytes Absolute 0.3 0.2 - 1.0 K/uL   Eosinophils Relative 10 %   Eosinophils Absolute 0.3 0 - 0.7 K/uL   Basophils Relative 1 %   Basophils Absolute 0.0 0 - 0.1 K/uL    Comment: Performed at [facility], [address] [place], [place] [address]  Comprehensive metabolic panel     Status: Abnormal   Collection Time: 06/26/17  1:39 PM  Result Value Ref Range   Sodium 129 (L) 135 - 145 mmol/L   Potassium 4.7 3.5 - 5.1 mmol/L   Chloride 89 (L) 98 - 111 mmol/L    Comment: Please note change in reference range.   CO2  29 22 - 32 mmol/L   Glucose, Bld 205 (H) 70 - 99 mg/dL    Comment: Please note change in reference range.   BUN 24 (H) 6 - 20 mg/dL    Comment: Please note change in reference range.   Creatinine, Ser 1.42 (H) 0.61 - 1.24 mg/dL   Calcium 8.9 8.9 - 10.3 mg/dL   Total Protein 7.1 6.5 - 8.1 g/dL   Albumin 2.9 (L) 3.5 - 5.0 g/dL   AST 45 (H) 15 - 41 U/L   ALT 23 0 - 44 U/L    Comment: Please note change in reference range.   Alkaline Phosphatase 466 (H) 38 - 126 U/L   Total Bilirubin 1.8 (H) 0.3 - 1.2 mg/dL   GFR calc non Af Amer 52 (L) >60 mL/min   GFR calc Af Amer >60 >60 mL/min    Comment: (NOTE) The eGFR has been calculated using the CKD EPI equation. This calculation has not been validated in all clinical situations. eGFR's persistently <60 mL/min signify possible Chronic Kidney Disease.    Anion gap 11 5 - 15    Comment: Performed at [facility], [address] [place], [place] [address]  CBC with Differential/Platelet     Status: Abnormal   Collection Time: 07/10/17  9:15 AM  Result Value Ref Range   WBC 3.3 (L) 3.8 - 10.6 K/uL   RBC 4.00 (L) 4.40 -  5.90 MIL/uL   Hemoglobin 11.1 (L) 13.0 - 18.0 g/dL   HCT 32.6 (L) 40.0 - 52.0 %   MCV 81.6 80.0 - 100.0 fL   MCH 27.7 26.0 - 34.0 pg   MCHC 34.0 32.0 - 36.0 g/dL   RDW 15.5 (H) 11.5 - 14.5 %   Platelets 139 (L) 150 - 440 K/uL   Neutrophils Relative % 57 %   Neutro Abs 1.9 1.4 - 6.5 K/uL   Lymphocytes Relative 19 %   Lymphs Abs 0.6 (L) 1.0 - 3.6 K/uL   Monocytes Relative 12 %   Monocytes Absolute 0.4 0.2 - 1.0 K/uL   Eosinophils Relative 11 %   Eosinophils Absolute 0.4 0 - 0.7 K/uL   Basophils Relative 1 %   Basophils Absolute 0.0 0 - 0.1 K/uL    Comment: Performed at [facility], [address] [place], [place] [address]  Comprehensive metabolic panel     Status: Abnormal   Collection Time: 07/10/17  9:15 AM  Result Value Ref Range   Sodium 128 (L) 135 - 145 mmol/L   Potassium 3.9 3.5 - 5.1 mmol/L    Chloride 86 (L) 98 - 111 mmol/L    Comment: Please note change in reference range.   CO2 28 22 - 32 mmol/L   Glucose, Bld 188 (H) 70 - 99 mg/dL    Comment: Please note change in reference range.   BUN 23 8 - 23 mg/dL    Comment: Please note change in reference range.   Creatinine, Ser 1.51 (H) 0.61 - 1.24 mg/dL   Calcium 9.7 8.9 - 10.3 mg/dL   Total Protein 7.7 6.5 - 8.1 g/dL   Albumin 3.2 (L) 3.5 - 5.0 g/dL   AST 43 (H) 15 - 41 U/L   ALT 21 0 - 44 U/L    Comment: Please note change in reference range.   Alkaline Phosphatase 502 (H) 38 - 126 U/L   Total Bilirubin 2.0 (H) 0.3 - 1.2 mg/dL   GFR calc non Af Amer 48 (L) >60 mL/min   GFR calc Af Amer 56 (L) >60 mL/min    Comment: (NOTE) The eGFR has been calculated using the CKD EPI equation. This calculation has not been validated in all clinical situations. eGFR's persistently <60 mL/min signify possible Chronic Kidney Disease.    Anion gap 14 5 - 15    Comment: Performed at [facility], [address] [place],  [address]  CBC with Differential/Platelet     Status: Abnormal   Collection Time: 07/24/17 11:04 AM  Result Value Ref Range   WBC 3.7 (L) 3.8 - 10.6 K/uL   RBC 4.30 (L) 4.40 - 5.90 MIL/uL   Hemoglobin 11.5 (L) 13.0 - 18.0 g/dL   HCT 35.0 (L) 40.0 - 52.0 %   MCV 81.3 80.0 - 100.0 fL   MCH 26.8 26.0 - 34.0 pg   MCHC 33.0 32.0 - 36.0 g/dL   RDW 15.3 (H) 11.5 - 14.5 %   Platelets 140 (L) 150 - 440 K/uL   Neutrophils Relative % 58 %   Neutro Abs 2.2 1.4 - 6.5 K/uL   Lymphocytes Relative 18 %   Lymphs Abs 0.7 (L) 1.0 - 3.6 K/uL   Monocytes Relative 13 %   Monocytes Absolute 0.5 0.2 - 1.0 K/uL   Eosinophils Relative 10 %   Eosinophils Absolute 0.4 0 - 0.7 K/uL   Basophils Relative 1 %   Basophils Absolute 0.0 0 - 0.1  K/uL    Comment: Performed at [facility], [address] [place], [place] [address]  Comprehensive metabolic panel     Status: Abnormal   Collection Time: 07/24/17 11:04 AM    Result Value Ref Range   Sodium 127 (L) 135 - 145 mmol/L   Potassium 4.3 3.5 - 5.1 mmol/L   Chloride 86 (L) 98 - 111 mmol/L   CO2 27 22 - 32 mmol/L   Glucose, Bld 158 (H) 70 - 99 mg/dL   BUN 27 (H) 8 - 23 mg/dL   Creatinine, Ser 1.37 (H) 0.61 - 1.24 mg/dL   Calcium 9.4 8.9 - 10.3 mg/dL   Total Protein 7.9 6.5 - 8.1 g/dL   Albumin 3.3 (L) 3.5 - 5.0 g/dL   AST 44 (H) 15 - 41 U/L   ALT 22 0 - 44 U/L   Alkaline Phosphatase 522 (H) 38 - 126 U/L   Total Bilirubin 1.8 (H) 0.3 - 1.2 mg/dL   GFR calc non Af Amer 54 (L) >60 mL/min   GFR calc Af Amer >60 >60 mL/min    Comment: (NOTE) The eGFR has been calculated using the CKD EPI equation. This calculation has not been validated in all clinical situations. eGFR's persistently <60 mL/min signify possible Chronic Kidney Disease.    Anion gap 14 5 - 15    Comment: Performed at [facility], [address] [place], [place] [address]  CBC with Differential/Platelet     Status: Abnormal   Collection Time: 08/07/17  9:07 AM  Result Value Ref Range   WBC 3.9 3.8 - 10.6 K/uL   RBC 4.33 (L) 4.40 - 5.90 MIL/uL   Hemoglobin 11.8 (L) 13.0 - 18.0 g/dL   HCT 35.4 (L) 40.0 - 52.0 %   MCV 81.9 80.0 - 100.0 fL   MCH 27.4 26.0 - 34.0 pg   MCHC 33.4 32.0 - 36.0 g/dL   RDW 15.8 (H) 11.5 - 14.5 %   Platelets 153 150 - 440 K/uL   Neutrophils Relative % 61 %   Neutro Abs 2.4 1.4 - 6.5 K/uL   Lymphocytes Relative 17 %   Lymphs Abs 0.7 (L) 1.0 - 3.6 K/uL   Monocytes Relative 14 %   Monocytes Absolute 0.5 0.2 - 1.0 K/uL   Eosinophils Relative 7 %   Eosinophils Absolute 0.3 0 - 0.7 K/uL   Basophils Relative 1 %   Basophils Absolute 0.0 0 - 0.1 K/uL    Comment: Performed at [facility], [address] [place], [place] [address]  Comprehensive metabolic panel     Status: Abnormal   Collection Time: 08/07/17  9:07 AM  Result Value Ref Range   Sodium 125 (L) 135 - 145 mmol/L   Potassium 4.5 3.5 - 5.1 mmol/L   Chloride 87 (L) 98 - 111  mmol/L   CO2 24 22 - 32 mmol/L   Glucose, Bld 147 (H) 70 - 99 mg/dL   BUN 27 (H) 8 - 23 mg/dL   Creatinine, Ser 1.63 (H) 0.61 - 1.24 mg/dL   Calcium 9.1 8.9 - 10.3 mg/dL   Total Protein 7.9 6.5 - 8.1 g/dL   Albumin 3.2 (L) 3.5 - 5.0 g/dL   AST 59 (H) 15 - 41 U/L   ALT 24 0 - 44 U/L   Alkaline Phosphatase 491 (H) 38 - 126 U/L   Total Bilirubin 2.1 (H) 0.3 - 1.2 mg/dL   GFR calc non Af Amer 44 (L) >60 mL/min  GFR calc Af Amer 51 (L) >60 mL/min    Comment: (NOTE) The eGFR has been calculated using the CKD EPI equation. This calculation has not been validated in all clinical situations. eGFR's persistently <60 mL/min signify possible Chronic Kidney Disease.    Anion gap 14 5 - 15    Comment: Performed at [facility], [address] [place], [place] [address]  Thyroid Panel With TSH     Status: Abnormal   Collection Time: 08/21/17  1:06 PM  Result Value Ref Range   TSH 8.370 (H) 0.450 - 4.500 uIU/mL   T4, Total 6.4 4.5 - 12.0 ug/dL   T3 Uptake Ratio 30 24 - 39 %   Free Thyroxine Index 1.9 1.2 - 4.9    Comment: (NOTE) Performed At: [facility] [place], [place] [id number] Rush Farmer MD [id number]   CBC with Differential/Platelet     Status: Abnormal   Collection Time: 08/21/17  1:06 PM  Result Value Ref Range   WBC 3.1 (L) 3.8 - 10.6 K/uL   RBC 4.16 (L) 4.40 - 5.90 MIL/uL   Hemoglobin 11.2 (L) 13.0 - 18.0 g/dL   HCT 33.6 (L) 40.0 - 52.0 %   MCV 80.9 80.0 - 100.0 fL   MCH 27.1 26.0 - 34.0 pg   MCHC 33.4 32.0 - 36.0 g/dL   RDW 15.5 (H) 11.5 - 14.5 %   Platelets 126 (L) 150 - 440 K/uL   Neutrophils Relative % 69 %   Neutro Abs 2.1 1.4 - 6.5 K/uL   Lymphocytes Relative 14 %   Lymphs Abs 0.4 (L) 1.0 - 3.6 K/uL   Monocytes Relative 11 %   Monocytes Absolute 0.4 0.2 - 1.0 K/uL   Eosinophils Relative 5 %   Eosinophils Absolute 0.1 0 - 0.7 K/uL   Basophils Relative 1 %   Basophils Absolute 0.0 0 - 0.1 K/uL    Comment: Performed at [facility], [address] [place], [place] [address]  Comprehensive metabolic panel     Status: Abnormal   Collection Time: 08/21/17  1:06 PM  Result Value Ref Range   Sodium 124 (L) 135 - 145 mmol/L   Potassium 5.5 (H) 3.5 - 5.1 mmol/L   Chloride 88 (L) 98 - 111 mmol/L   CO2 27 22 - 32 mmol/L   Glucose, Bld 256 (H) 70 - 99 mg/dL   BUN 23 8 - 23 mg/dL   Creatinine, Ser 1.38 (H) 0.61 - 1.24 mg/dL   Calcium 8.5 (L) 8.9 - 10.3 mg/dL   Total Protein 7.1 6.5 - 8.1 g/dL   Albumin 3.1 (L) 3.5 - 5.0 g/dL   AST 55 (H) 15 - 41 U/L   ALT 27 0 - 44 U/L   Alkaline Phosphatase 516 (H) 38 - 126 U/L   Total Bilirubin 2.0 (H) 0.3 - 1.2 mg/dL   GFR calc non Af Amer 54 (L) >60 mL/min   GFR calc Af Amer >60 >60 mL/min    Comment: (NOTE) The eGFR has been calculated using the CKD EPI equation. This calculation has not been validated in all clinical situations. eGFR's persistently <60 mL/min signify possible Chronic Kidney Disease.    Anion gap 9 5 - 15    Comment: Performed at [facility], [address] [place], [place] [address]  CBC with Differential/Platelet     Status: Abnormal   Collection Time: 09/11/17 10:31 AM  Result Value Ref Range   WBC 3.9 3.8 -  10.6 K/uL   RBC 4.29 (L) 4.40 - 5.90 MIL/uL   Hemoglobin 11.9 (L) 13.0 - 18.0 g/dL   HCT 35.2 (L) 40.0 - 52.0 %   MCV 82.0 80.0 - 100.0 fL   MCH 27.7 26.0 - 34.0 pg   MCHC 33.7 32.0 - 36.0 g/dL   RDW 15.0 (H) 11.5 - 14.5 %   Platelets 159 150 - 440 K/uL   Neutrophils Relative % 61 %   Neutro Abs 2.4 1.4 - 6.5 K/uL   Lymphocytes Relative 19 %   Lymphs Abs 0.7 (L) 1.0 - 3.6 K/uL   Monocytes Relative 13 %   Monocytes Absolute 0.5 0.2 - 1.0 K/uL   Eosinophils Relative 6 %   Eosinophils Absolute 0.2 0 - 0.7 K/uL   Basophils Relative 1 %   Basophils Absolute 0.0 0 - 0.1 K/uL    Comment: Performed at [facility], [address] [place], [place] [address]  Comprehensive metabolic panel     Status: Abnormal    Collection Time: 09/11/17 10:31 AM  Result Value Ref Range   Sodium 119 (LL) 135 - 145 mmol/L    Comment: RESULTS VERIFIED BY REPEAT TESTING CRITICAL RESULT CALLED TO, READ BACK BY AND VERIFIED WITH [facility] [place] AT 10:58 09/11/2017 KMR    Potassium 5.2 (H) 3.5 - 5.1 mmol/L   Chloride 85 (L) 98 - 111 mmol/L   CO2 25 22 - 32 mmol/L   Glucose, Bld 69 (L) 70 - 99 mg/dL   BUN 19 8 - 23 mg/dL   Creatinine, Ser 1.20 0.61 - 1.24 mg/dL   Calcium 9.0 8.9 - 10.3 mg/dL   Total Protein 7.2 6.5 - 8.1 g/dL   Albumin 3.3 (L) 3.5 - 5.0 g/dL   AST 51 (H) 15 - 41 U/L   ALT 23 0 - 44 U/L   Alkaline Phosphatase 417 (H) 38 - 126 U/L   Total Bilirubin 1.9 (H) 0.3 - 1.2 mg/dL   GFR calc non Af Amer >60 >60 mL/min   GFR calc Af Amer >60 >60 mL/min    Comment: (NOTE) The eGFR has been calculated using the CKD EPI equation. This calculation has not been validated in all clinical situations. eGFR's persistently <60 mL/min signify possible Chronic Kidney Disease.    Anion gap 9 5 - 15    Comment: Performed at [facility], [address] [place], [place] [address]  Thyroid Panel With TSH     Status: Abnormal   Collection Time: 09/11/17 10:31 AM  Result Value Ref Range   TSH 9.560 (H) 0.450 - 4.500 uIU/mL   T4, Total 8.8 4.5 - 12.0 ug/dL   T3 Uptake Ratio 31 24 - 39 %   Free Thyroxine Index 2.7 1.2 - 4.9    Comment: (NOTE) Performed At: [facility] [place], [place] [id number] Rush Farmer MD [id number]   Basic metabolic panel     Status: Abnormal   Collection Time: 09/13/17  9:44 PM  Result Value Ref Range   Sodium 117 (LL) 135 - 145 mmol/L    Comment: CRITICAL RESULT CALLED TO, READ BACK BY AND VERIFIED WITH JOHN HOFFMASTER 09/13/17 2224 JML    Potassium 5.2 (H) 3.5 - 5.1 mmol/L   Chloride 85 (L) 98 - 111 mmol/L   CO2 25 22 - 32 mmol/L   Glucose, Bld 243 (H) 70 - 99 mg/dL   BUN 19 8 - 23 mg/dL   Creatinine, Ser 1.35 (H) 0.61 -  1.24 mg/dL   Calcium 8.2 (L)  8.9 - 10.3 mg/dL   GFR calc non Af Amer 55 (L) >60 mL/min   GFR calc Af Amer >60 >60 mL/min    Comment: (NOTE) The eGFR has been calculated using the CKD EPI equation. This calculation has not been validated in all clinical situations. eGFR's persistently <60 mL/min signify possible Chronic Kidney Disease.    Anion gap 7 5 - 15    Comment: Performed at [facility], [address] [place], [place] [address]  Hepatic function panel     Status: Abnormal   Collection Time: 09/13/17  9:44 PM  Result Value Ref Range   Total Protein 6.6 6.5 - 8.1 g/dL   Albumin 3.0 (L) 3.5 - 5.0 g/dL   AST 52 (H) 15 - 41 U/L   ALT 27 0 - 44 U/L   Alkaline Phosphatase 444 (H) 38 - 126 U/L   Total Bilirubin 1.7 (H) 0.3 - 1.2 mg/dL   Bilirubin, Direct 0.7 (H) 0.0 - 0.2 mg/dL   Indirect Bilirubin 1.0 (H) 0.3 - 0.9 mg/dL    Comment: Performed at [facility], [address] [place], [place] [address]  CBC with Differential     Status: Abnormal   Collection Time: 09/13/17  9:44 PM  Result Value Ref Range   WBC 3.3 (L) 3.8 - 10.6 K/uL   RBC 4.28 (L) 4.40 - 5.90 MIL/uL   Hemoglobin 12.0 (L) 13.0 - 18.0 g/dL   HCT 35.9 (L) 40.0 - 52.0 %   MCV 83.9 80.0 - 100.0 fL   MCH 28.0 26.0 - 34.0 pg   MCHC 33.4 32.0 - 36.0 g/dL   RDW 15.6 (H) 11.5 - 14.5 %   Platelets 137 (L) 150 - 440 K/uL   Neutrophils Relative % 65 %   Neutro Abs 2.2 1.4 - 6.5 K/uL   Lymphocytes Relative 17 %   Lymphs Abs 0.5 (L) 1.0 - 3.6 K/uL   Monocytes Relative 11 %   Monocytes Absolute 0.4 0.2 - 1.0 K/uL   Eosinophils Relative 6 %   Eosinophils Absolute 0.2 0 - 0.7 K/uL   Basophils Relative 1 %   Basophils Absolute 0.0 0 - 0.1 K/uL    Comment: Performed at [facility], [address] [place], [place] [address]  Protime-INR     Status: None   Collection Time: 09/13/17  9:44 PM  Result Value Ref Range   Prothrombin Time 14.6 11.4 - 15.2 seconds   INR 1.15     Comment: Performed at [facility], [address] [place], [place] [address]  Basic metabolic panel     Status: Abnormal   Collection Time: 09/14/17  4:43 AM  Result Value Ref Range   Sodium 122 (L) 135 - 145 mmol/L   Potassium 4.5 3.5 - 5.1 mmol/L   Chloride 92 (L) 98 - 111 mmol/L   CO2 26 22 - 32 mmol/L   Glucose, Bld 110 (H) 70 - 99 mg/dL   BUN 16 8 - 23 mg/dL   Creatinine, Ser 1.24 0.61 - 1.24 mg/dL   Calcium 8.0 (L) 8.9 - 10.3 mg/dL   GFR calc non Af Amer >60 >60 mL/min   GFR calc Af Amer >60 >60 mL/min    Comment: (NOTE) The eGFR has been calculated using the CKD EPI equation. This calculation has not been validated in all clinical situations. eGFR's persistently <60 mL/min signify possible Chronic Kidney Disease.  Anion gap 4 (L) 5 - 15    Comment: Performed at [facility], [address] [place], [place] [address]  CBC     Status: Abnormal   Collection Time: 09/14/17  4:43 AM  Result Value Ref Range   WBC 3.5 (L) 3.8 - 10.6 K/uL   RBC 3.88 (L) 4.40 - 5.90 MIL/uL   Hemoglobin 10.9 (L) 13.0 - 18.0 g/dL   HCT 32.5 (L) 40.0 - 52.0 %   MCV 83.7 80.0 - 100.0 fL   MCH 28.2 26.0 - 34.0 pg   MCHC 33.7 32.0 - 36.0 g/dL   RDW 16.0 (H) 11.5 - 14.5 %   Platelets 113 (L) 150 - 440 K/uL    Comment: Performed at [facility], [address] [place], [place] [address]  Glucose, capillary     Status: Abnormal   Collection Time: 09/14/17  7:50 AM  Result Value Ref Range   Glucose-Capillary 139 (H) 70 - 99 mg/dL  Glucose, capillary     Status: Abnormal   Collection Time: 09/14/17 11:32 AM  Result Value Ref Range   Glucose-Capillary 139 (H) 70 - 99 mg/dL  Glucose, capillary     Status: Abnormal   Collection Time: 09/14/17  4:33 PM  Result Value Ref Range   Glucose-Capillary 230 (H) 70 - 99 mg/dL  Glucose, capillary     Status: Abnormal   Collection Time: 09/14/17  8:40 PM  Result Value Ref Range   Glucose-Capillary 236 (H) 70 - 99 mg/dL  Basic metabolic panel     Status: Abnormal     Collection Time: 09/15/17  5:06 AM  Result Value Ref Range   Sodium 130 (L) 135 - 145 mmol/L   Potassium 4.9 3.5 - 5.1 mmol/L   Chloride 96 (L) 98 - 111 mmol/L   CO2 26 22 - 32 mmol/L   Glucose, Bld 59 (L) 70 - 99 mg/dL   BUN 20 8 - 23 mg/dL   Creatinine, Ser 1.87 (H) 0.61 - 1.24 mg/dL   Calcium 8.2 (L) 8.9 - 10.3 mg/dL   GFR calc non Af Amer 37 (L) >60 mL/min   GFR calc Af Amer 43 (L) >60 mL/min    Comment: (NOTE) The eGFR has been calculated using the CKD EPI equation. This calculation has not been validated in all clinical situations. eGFR's persistently <60 mL/min signify possible Chronic Kidney Disease.    Anion gap 8 5 - 15    Comment: Performed at [facility], [address] [place], [place] [address]  Ferritin     Status: None   Collection Time: 09/15/17  5:06 AM  Result Value Ref Range   Ferritin 54 24 - 336 ng/mL    Comment: Performed at [facility] [employer], [address] [place], [place] [address]  Iron and TIBC     Status: Abnormal   Collection Time: 09/15/17  5:06 AM  Result Value Ref Range   Iron 55 45 - 182 ug/dL   TIBC 243 (L) 250 - 450 ug/dL   Saturation Ratios 23 17.9 - 39.5 %   UIBC 188 ug/dL    Comment: Performed at [facility], [address] [place],  [address]  Vitamin B12     Status: None   Collection Time: 09/15/17  5:06 AM  Result Value Ref Range   Vitamin B-12 731 180 - 914 pg/mL    Comment: (NOTE) This assay is not validated for testing neonatal or myeloproliferative syndrome specimens  for Vitamin B12 levels. Performed at [facility], [place] [address], [place], [place] [address]   Folate     Status: None   Collection Time: 09/15/17  5:06 AM  Result Value Ref Range   Folate 15.8 >5.9 ng/mL    Comment: Performed at [facility], [address] [place], [place] [address]  Glucose, capillary     Status: Abnormal   Collection Time: 09/15/17  7:19 AM  Result Value Ref Range    Glucose-Capillary 40 (LL) 70 - 99 mg/dL    Comment: REPEATED TO VERIFY, CHARGE CREDITED TEST WILL BE CREDITED Performed at [facility], [address], [place], [place] [address]    Comment 1 Notify RN   Glucose, capillary     Status: Abnormal   Collection Time: 09/15/17  7:21 AM  Result Value Ref Range   Glucose-Capillary 41 (LL) 70 - 99 mg/dL   Comment 1 Notify RN   Glucose, capillary     Status: Abnormal   Collection Time: 09/15/17  7:40 AM  Result Value Ref Range   Glucose-Capillary 152 (H) 70 - 99 mg/dL  MRSA PCR Screening     Status: None   Collection Time: 09/15/17  8:17 AM  Result Value Ref Range   MRSA by PCR NEGATIVE NEGATIVE    Comment:        The GeneXpert MRSA Assay (FDA approved for NASAL specimens only), is one component of a comprehensive MRSA colonization surveillance program. It is not intended to diagnose MRSA infection nor to guide or monitor treatment for MRSA infections. Performed at [facility], [address] [place], [place] [address]   Cortisol     Status: None   Collection Time: 09/15/17  9:46 AM  Result Value Ref Range   Cortisol, Plasma 35.0 ug/dL    Comment: (NOTE) AM    6.7 - 22.6 ug/dL PM   <10.0       ug/dL Performed at [facility] [address] [place], [place] [address]   TSH     Status: Abnormal   Collection Time: 09/15/17  9:46 AM  Result Value Ref Range   TSH 5.612 (H) 0.350 - 4.500 uIU/mL    Comment: Performed by a 3rd Generation assay with a functional sensitivity of <=0.01 uIU/mL. Performed at [facility], [address] [place], [place] [address]   Glucose, capillary     Status: None   Collection Time: 09/15/17 11:39 AM  Result Value Ref Range   Glucose-Capillary 99 70 - 99 mg/dL  Glucose, capillary     Status: Abnormal   Collection Time: 09/15/17  3:01 PM  Result Value Ref Range   Glucose-Capillary 169 (H) 70 - 99 mg/dL  Glucose, capillary     Status: Abnormal   Collection  Time: 09/15/17  3:47 PM  Result Value Ref Range   Glucose-Capillary 190 (H) 70 - 99 mg/dL  Glucose, capillary     Status: Abnormal   Collection Time: 09/15/17 10:21 PM  Result Value Ref Range   Glucose-Capillary 206 (H) 70 - 99 mg/dL  Magnesium     Status: None   Collection Time: 09/16/17  5:32 AM  Result Value Ref Range   Magnesium 1.8 1.7 - 2.4 mg/dL    Comment: Performed at [facility], [address] [place],  [address]  Phosphorus     Status: None   Collection Time: 09/16/17  5:32 AM  Result Value Ref Range  Phosphorus 4.6 2.5 - 4.6 mg/dL    Comment: Performed at [facility], [address] [place], [place] [address]  CBC     Status: Abnormal   Collection Time: 09/16/17  5:32 AM  Result Value Ref Range   WBC 11.9 (H) 3.8 - 10.6 K/uL   RBC 4.06 (L) 4.40 - 5.90 MIL/uL   Hemoglobin 11.4 (L) 13.0 - 18.0 g/dL   HCT 34.3 (L) 40.0 - 52.0 %   MCV 84.6 80.0 - 100.0 fL   MCH 28.0 26.0 - 34.0 pg   MCHC 33.1 32.0 - 36.0 g/dL   RDW 16.6 (H) 11.5 - 14.5 %   Platelets 106 (L) 150 - 440 K/uL    Comment: Performed at [facility], [address] [place], [place] [address]  Comprehensive metabolic panel     Status: Abnormal   Collection Time: 09/16/17  5:37 AM  Result Value Ref Range   Sodium 126 (L) 135 - 145 mmol/L   Potassium 5.5 (H) 3.5 - 5.1 mmol/L   Chloride 95 (L) 98 - 111 mmol/L   CO2 25 22 - 32 mmol/L   Glucose, Bld 243 (H) 70 - 99 mg/dL   BUN 33 (H) 8 - 23 mg/dL   Creatinine, Ser 1.59 (H) 0.61 - 1.24 mg/dL   Calcium 7.5 (L) 8.9 - 10.3 mg/dL   Total Protein 5.3 (L) 6.5 - 8.1 g/dL   Albumin 2.3 (L) 3.5 - 5.0 g/dL   AST 26 15 - 41 U/L   ALT 14 0 - 44 U/L   Alkaline Phosphatase 212 (H) 38 - 126 U/L   Total Bilirubin 3.2 (H) 0.3 - 1.2 mg/dL   GFR calc non Af Amer 45 (L) >60 mL/min   GFR calc Af Amer 52 (L) >60 mL/min    Comment: (NOTE) The eGFR has been calculated using the CKD EPI equation. This calculation has not been validated  in all clinical situations. eGFR's persistently <60 mL/min signify possible Chronic Kidney Disease.    Anion gap 6 5 - 15    Comment: Performed at [facility], [address] [place], [place] [address]  Glucose, capillary     Status: Abnormal   Collection Time: 09/16/17  7:37 AM  Result Value Ref Range   Glucose-Capillary 212 (H) 70 - 99 mg/dL  T4     Status: Abnormal   Collection Time: 09/16/17  7:52 AM  Result Value Ref Range   T4, Total 4.3 (L) 4.5 - 12.0 ug/dL    Comment: (NOTE) Performed At: [facility] [address] [place], [place] [id number] Rush Farmer MD [id number]   T3, free     Status: Abnormal   Collection Time: 09/16/17  7:52 AM  Result Value Ref Range   T3, Free 1.4 (L) 2.0 - 4.4 pg/mL    Comment: (NOTE) Performed At: [facility] [address] [place], [place] [id number] Rush Farmer MD [id number]   Glucose, capillary     Status: Abnormal   Collection Time: 09/16/17  1:14 PM  Result Value Ref Range   Glucose-Capillary 250 (H) 70 - 99 mg/dL  Glucose, capillary     Status: Abnormal   Collection Time: 09/16/17  3:52 PM  Result Value Ref Range   Glucose-Capillary 298 (H) 70 - 99 mg/dL  Glucose, capillary     Status: Abnormal   Collection Time: 09/16/17  9:28 PM  Result Value Ref Range   Glucose-Capillary 430 (H) 70 - 99 mg/dL  Comment 1 Call MD NNP PA CNM   Glucose, capillary     Status: Abnormal   Collection Time: 09/16/17 11:04 PM  Result Value Ref Range   Glucose-Capillary 443 (H) 70 - 99 mg/dL  Glucose, capillary     Status: Abnormal   Collection Time: 09/17/17 12:37 AM  Result Value Ref Range   Glucose-Capillary 387 (H) 70 - 99 mg/dL  Basic metabolic panel     Status: Abnormal   Collection Time: 09/17/17  3:31 AM  Result Value Ref Range   Sodium 124 (L) 135 - 145 mmol/L   Potassium 5.3 (H) 3.5 - 5.1 mmol/L   Chloride 92 (L) 98 - 111 mmol/L   CO2 26 22 - 32 mmol/L   Glucose, Bld 279 (H) 70 - 99 mg/dL    BUN 45 (H) 8 - 23 mg/dL   Creatinine, Ser 1.46 (H) 0.61 - 1.24 mg/dL   Calcium 7.7 (L) 8.9 - 10.3 mg/dL   GFR calc non Af Amer 50 (L) >60 mL/min   GFR calc Af Amer 58 (L) >60 mL/min    Comment: (NOTE) The eGFR has been calculated using the CKD EPI equation. This calculation has not been validated in all clinical situations. eGFR's persistently <60 mL/min signify possible Chronic Kidney Disease.    Anion gap 6 5 - 15    Comment: Performed at [facility], [address] [place], [place] [address]  CBC     Status: Abnormal   Collection Time: 09/17/17  3:31 AM  Result Value Ref Range   WBC 3.8 3.8 - 10.6 K/uL   RBC 3.35 (L) 4.40 - 5.90 MIL/uL   Hemoglobin 9.6 (L) 13.0 - 18.0 g/dL   HCT 28.3 (L) 40.0 - 52.0 %   MCV 84.4 80.0 - 100.0 fL   MCH 28.7 26.0 - 34.0 pg   MCHC 34.0 32.0 - 36.0 g/dL   RDW 16.4 (H) 11.5 - 14.5 %   Platelets 68 (L) 150 - 440 K/uL    Comment: RESULT REPEATED AND VERIFIED Performed at [facility], [address] [place], [place] [address]   Glucose, capillary     Status: Abnormal   Collection Time: 09/17/17  7:09 AM  Result Value Ref Range   Glucose-Capillary 190 (H) 70 - 99 mg/dL  Glucose, capillary     Status: Abnormal   Collection Time: 09/17/17  7:25 AM  Result Value Ref Range   Glucose-Capillary 175 (H) 70 - 99 mg/dL  Glucose, capillary     Status: Abnormal   Collection Time: 09/17/17 11:57 AM  Result Value Ref Range   Glucose-Capillary 237 (H) 70 - 99 mg/dL  Basic metabolic panel     Status: Abnormal   Collection Time: 09/17/17 12:27 PM  Result Value Ref Range   Sodium 124 (L) 135 - 145 mmol/L   Potassium 5.5 (H) 3.5 - 5.1 mmol/L   Chloride 93 (L) 98 - 111 mmol/L   CO2 24 22 - 32 mmol/L   Glucose, Bld 255 (H) 70 - 99 mg/dL   BUN 45 (H) 8 - 23 mg/dL   Creatinine, Ser 1.27 (H) 0.61 - 1.24 mg/dL   Calcium 7.8 (L) 8.9 - 10.3 mg/dL   GFR calc non Af Amer 59 (L) >60 mL/min   GFR calc Af Amer >60 >60 mL/min    Comment:  (NOTE) The eGFR has been calculated using the CKD EPI equation. This calculation has not been validated in all clinical situations. eGFR's persistently <60  mL/min signify possible Chronic Kidney Disease.    Anion gap 7 5 - 15    Comment: Performed at [facility], [address] [place], [place] [address]  Glucose, capillary     Status: Abnormal   Collection Time: 09/17/17  4:40 PM  Result Value Ref Range   Glucose-Capillary 196 (H) 70 - 99 mg/dL  Glucose, capillary     Status: Abnormal   Collection Time: 09/17/17  9:55 PM  Result Value Ref Range   Glucose-Capillary 273 (H) 70 - 99 mg/dL  Basic metabolic panel     Status: Abnormal   Collection Time: 09/18/17  4:53 AM  Result Value Ref Range   Sodium 130 (L) 135 - 145 mmol/L   Potassium 4.2 3.5 - 5.1 mmol/L   Chloride 97 (L) 98 - 111 mmol/L   CO2 26 22 - 32 mmol/L   Glucose, Bld 180 (H) 70 - 99 mg/dL   BUN 49 (H) 8 - 23 mg/dL   Creatinine, Ser 1.16 0.61 - 1.24 mg/dL   Calcium 8.1 (L) 8.9 - 10.3 mg/dL   GFR calc non Af Amer >60 >60 mL/min   GFR calc Af Amer >60 >60 mL/min    Comment: (NOTE) The eGFR has been calculated using the CKD EPI equation. This calculation has not been validated in all clinical situations. eGFR's persistently <60 mL/min signify possible Chronic Kidney Disease.    Anion gap 7 5 - 15    Comment: Performed at [facility], [address] [place], [place] [address]  Glucose, capillary     Status: None   Collection Time: 09/18/17  7:44 AM  Result Value Ref Range   Glucose-Capillary 92 70 - 99 mg/dL   Comment 1 Notify RN   Lipase, blood     Status: None   Collection Time: 09/20/17  5:28 PM  Result Value Ref Range   Lipase 40 11 - 51 U/L    Comment: Performed at [facility], [address] [place], [place] [address]  Comprehensive metabolic panel     Status: Abnormal   Collection Time: 09/20/17  5:28 PM  Result Value Ref Range   Sodium 128 (L) 135 - 145 mmol/L    Potassium 4.8 3.5 - 5.1 mmol/L   Chloride 94 (L) 98 - 111 mmol/L   CO2 27 22 - 32 mmol/L   Glucose, Bld 194 (H) 70 - 99 mg/dL   BUN 32 (H) 8 - 23 mg/dL   Creatinine, Ser 1.15 0.61 - 1.24 mg/dL   Calcium 7.9 (L) 8.9 - 10.3 mg/dL   Total Protein 5.8 (L) 6.5 - 8.1 g/dL   Albumin 2.6 (L) 3.5 - 5.0 g/dL   AST 37 15 - 41 U/L   ALT 32 0 - 44 U/L   Alkaline Phosphatase 391 (H) 38 - 126 U/L   Total Bilirubin 3.9 (H) 0.3 - 1.2 mg/dL   GFR calc non Af Amer >60 >60 mL/min   GFR calc Af Amer >60 >60 mL/min    Comment: (NOTE) The eGFR has been calculated using the CKD EPI equation. This calculation has not been validated in all clinical situations. eGFR's persistently <60 mL/min signify possible Chronic Kidney Disease.    Anion gap 7 5 - 15    Comment: Performed at [facility], [address] [place], [place] [address]  CBC     Status: Abnormal   Collection Time: 09/20/17  5:28 PM  Result Value Ref Range   WBC  7.6 3.8 - 10.6 K/uL   RBC 4.48 4.40 - 5.90 MIL/uL   Hemoglobin 12.7 (L) 13.0 - 18.0 g/dL   HCT 37.5 (L) 40.0 - 52.0 %   MCV 83.7 80.0 - 100.0 fL   MCH 28.4 26.0 - 34.0 pg   MCHC 33.9 32.0 - 36.0 g/dL   RDW 15.8 (H) 11.5 - 14.5 %   Platelets 125 (L) 150 - 440 K/uL    Comment: Performed at [facility], [address] [place], [place] [address]   Objective  Body mass index is 22.4 kg/m. Wt Readings from Last 3 Encounters:  09/23/17 160 lb 9.6 oz (72.8 kg)  09/20/17 150 lb (68 kg)  09/18/17 69 lb 1.6 oz (31.3 kg)   Temp Readings from Last 3 Encounters:  09/23/17 98.2 F (36.8 C) (Oral)  09/20/17 98.7 F (37.1 C) (Oral)  09/17/17 97.7 F (36.5 C) (Oral)   BP Readings from Last 3 Encounters:  09/23/17 92/60  09/20/17 116/72  09/18/17 114/73   Pulse Readings from Last 3 Encounters:  09/23/17 94  09/20/17 80  09/18/17 61    Physical Exam  Constitutional: He is oriented to person, place, and time. He appears well-developed and well-nourished. He  is cooperative.  HENT:  Head: Normocephalic and atraumatic.  Mouth/Throat: Oropharynx is clear and moist and mucous membranes are normal.  Eyes: Pupils are equal, round, and reactive to light. Conjunctivae are normal.  Cardiovascular: Normal rate, regular rhythm and normal heart sounds.  Pulmonary/Chest: Effort normal and breath sounds normal.  Abdominal: He exhibits distension, fluid wave and ascites. There is no tenderness.  Genitourinary:  Genitourinary Comments: Scrotal edema L>R  Neurological: He is alert and oriented to person, place, and time. Gait normal.  Skin: Skin is warm and dry. Abrasion noted.     Psychiatric: He has a normal mood and affect. His speech is normal and behavior is normal. Judgment and thought content normal. Cognition and memory are normal.  Nursing note and vitals reviewed.   Assessment   1. Acute on chronic Hyponatremia likely related to liver cirrhosis with ascites and fluid overload with complications and umbilical hernia, AKI, thrombocytopenia, scrotal edema 2. DM 2  3. HSV and mucositis  4. Fall  5. Metastatic renal cell cancer  6. HTN h/o now hypotensive  7. HM Plan   1.  Called Dr. Jonathon Bellows who rec pt come to the office today at 1 pm  Refer urology  2. A1C POC today 8.9  Increase levemir from 35 to 37 units SSI  3. Valtrex bid x 5-10 days prn  Magic mouthwash  4. Monitor  5. F/u H/o defer palliative care decision to them  6. Off norvasc 5 mg and spirolactone 50 mg  7.  Had flu shot  Tdap given today  pna 23 01/28/15 and prevnar 11/30/13  Disc shingrix in future   TSH need to monitor will not treat for now consider thyroid US Former smoker  PSA neg 05/08/17  Colonoscopy get records [facility] in [place] release signed today   bactroban to abrasions legs b/l and given Tdap  Provider: Dr. Olivia Mackie [name]-Internal Medicine

## 2017-09-23 NOTE — Progress Notes (Signed)
Jonathon Bellows MD, MRCP(U.K) [address]  [place]  [place], [place] [address]  Main: [phone]  Fax: [phone]   Primary Care Physician: [name], [name], MD  Primary Gastroenterologist:  Dr. Jonathon Bellows   No chief complaint on file.   HPI: Rodney Stewart is a 62 y.o. male   He has been asked to come and see me today due to concerns of fluid overload, edema by [name], [name], MD   Summary of history : Cirrhosis diagnosed in early 2019. He has a history of stage 4 RCC , b/l lung metastasis . CKD 3 and Diabetes Mellitus , on chemotherapy. He has pancytopenia and follows with Dr Grayland Ormond.  CT scan of the abdomen done on 02/18/2017 showed subcarinal lymphadenopathy. Multiple stable bilateral pulmonary nodules. Status post left nephrectomy. Liver features suggestive of cirrhosis splenomegaly recanalization of the paraumbilical vein. Large volume ascites. Cholelithiasis. Labs 02/19/17 : Immune to hep A. HCv ab -negative, Hep B e antigen positive , hep b c ab,HIV,AMA,Factin,ceruloplasmin -negtive. PTH normal. A1AT -normal , TSH elevated. Cr 1.37 , GFR 55, alk phos 293, ALT 16 , AST 39 , Ascites - no SBP,SAAG-suggetsive of portal HTN.  He has consumed 6-7 beers a day x many years atleast 30 years Hbsag, Hep B viral load -negative EGD 04/15/17- PHG noted no varices.    Labs 04/2017 : Hb 8.3, MCV 75 ,Iron 13, Ferritin 17 Hepatitis Delta virus PCR negative, antigen positive. Hbsag negative.   06/07/17- Ct abdomen and chest shows disease progression , cirrhosis and large volume ascites. Chololithiasis, left inguinal hernias    Interval history6/25/19-9/23/19  He was admitted on 09/03/2017 and discharged on 09/18/2017 at the hospital.  Main reason for admission was acute hyponatremia ascites and protein calorie malnutrition that has been severe.  He had his ascites drained and developed hypovolemia and had to be placed on pressors.  He had a paracentesis  but no labs were taken to rule out SBP.  He did receive Rocephin during the hospitalization.  His beta-blocker was stopped due to low blood pressure.  He also follows with nephrology for his AKI and hyponatremia.  Plan was for outpatient palliative care follow-up for stage IV renal cell carcinoma with metastasis.  He has had a slow leak ascites around his umbilical hernia  CT scan of the abdomen on 09/20/2017 shows moderate to large volume ascites despite recent paracentesis.  Cirrhosis of the liver with splenomegaly.  Wedge-shaped perfusion anomaly of the medial segment of the left hepatic lobe prior mass cannot be entirely excluded.  Stable pulmonary nodules with dominant mass in the left lower lobe.  Large multiloculated fluid containing ventral hernia with herniation of omental vessels.  Labs 09/20/2017 showed an albumin of 2.6, creatinine of 1.15.  Hemoglobin 12.7 g 3 days back.  INR 10 days back was 1.15.   Referred to [facility] and they suggested if Hbsag is negative no treatment for Delta antigen status  He was seen by his primary care doctor this morning and she in fact spoke to me over the phone and was concerned about the state of fluid overload ascites, edema.  And I suggested that I see him today.     Saying he doing well. Cut down salt in his diet .  Aldactone 100 mg and lasix 40 mg a day . Low salt diet . Still drinks an odd glass of wine. Says it is very seldom.      Current Outpatient Medications  Medication Sig Dispense Refill  . acetaminophen (TYLENOL) 325 MG tablet Take 325 mg by mouth every 6 (six) hours as needed.    Marland Kitchen albuterol (PROVENTIL HFA;VENTOLIN HFA) 108 (90 Base) MCG/ACT inhaler Inhale 1-2 puffs into the lungs every 6 (six) hours as needed for wheezing or shortness of breath. 1 Inhaler 11  . allopurinol (ZYLOPRIM) 100 MG tablet Take 1 tablet (100 mg total) by mouth daily. 90 tablet 3  . ALPRAZolam (XANAX) 1 MG tablet Take 1 mg by mouth as needed for sleep.    Marland Kitchen  aspirin 81 MG chewable tablet Chew 81 mg by mouth daily.    . colchicine 0.6 MG tablet Take 0.6 mg by mouth every other day.     . feeding supplement, ENSURE ENLIVE, (ENSURE ENLIVE) LIQD Take 237 mLs by mouth 3 (three) times daily between meals. 237 mL 12  . fluticasone (FLONASE) 50 MCG/ACT nasal spray   0  . gabapentin (NEURONTIN) 100 MG capsule Take 2 capsules (200 mg total) by mouth 3 (three) times daily. (Patient taking differently: Take 200 mg by mouth 2 (two) times daily. ) 360 capsule 5  . insulin aspart (NOVOLOG) 100 UNIT/ML FlexPen Before meals 131-180 2 units, 181-240 4 units, 241-300 6 units, 301-350 8 units, 351-400 10 units, >400 12 units 15 mL 0  . Insulin Detemir (LEVEMIR FLEXTOUCH) 100 UNIT/ML Pen Inject 40 Units into the skin daily at 10 pm. 12 pen 12  . Insulin Pen Needle 32G X 4 MM MISC 1 Device by Does not apply route 2 (two) times daily. E11.9 200 each 12  . lovastatin (MEVACOR) 20 MG tablet Take 1 tablet (20 mg total) by mouth daily at 6 PM. 90 tablet 3  . magic mouthwash w/lidocaine SOLN Take 5 mLs by mouth 3 (three) times daily as needed for mouth pain. Swish and spit 250 mL 0  . mupirocin ointment (BACTROBAN) 2 % Place 1 application into the nose 2 (two) times daily. 22 g 0  . Nebivolol HCl 20 MG TABS Take 20 mg by mouth daily.    . ONE TOUCH ULTRA TEST test strip Bid 180 each 3  . oxyCODONE (ROXICODONE) 5 MG immediate release tablet Take 1 tablet (5 mg total) by mouth every 6 (six) hours as needed for breakthrough pain. 15 tablet 0  . tamsulosin (FLOMAX) 0.4 MG CAPS capsule Take 0.4 mg by mouth every other day.     . valACYclovir (VALTREX) 1000 MG tablet Take 1 tablet (1,000 mg total) by mouth 2 (two) times daily. X 5-10 days with food 30 tablet 5   Current Facility-Administered Medications  Medication Dose Route Frequency Provider Last Rate Last Dose  . albumin human 25 % solution 12.5 g  12.5 g Intravenous Once Jonathon Bellows, MD      . albumin human 25 % solution 12.5 g   12.5 g Intravenous Once Jonathon Bellows, MD      . albumin human 5 % solution 25 g  25 g Intravenous Once Jonathon Bellows, MD        Allergies as of 09/23/2017  . (No Known Allergies)    ROS:  General: Negative for anorexia, weight loss, fever, chills, fatigue, weakness. ENT: Negative for hoarseness, difficulty swallowing , nasal congestion. CV: Negative for chest pain, angina, palpitations, dyspnea on exertion, peripheral edema.  Respiratory: Negative for dyspnea at rest, dyspnea on exertion, cough, sputum, wheezing.  GI: See history of present illness. GU:  Endo: Negative for unusual weight change.  Physical Examination:   BP 103/68   Pulse 91   Ht 5' 11"  (1.803 m)   Wt 160 lb 12.8 oz (72.9 kg)   BMI 22.43 kg/m   General: very thin and cachectic  Eyes: No icterus. Conjunctivae pink. Mouth: Oropharyngeal mucosa moist and pink , no lesions erythema or exudate. Lungs: Clear to auscultation bilaterally. Non-labored. Heart: Regular rate and rhythm, no murmurs rubs or gallops.  Abdomen: distended, non tender, no guarding or rigidity .  Large hydrocele of his scrotum. I did not do an illumination test  Extremities: thin and edematous . Neuro: Alert and oriented x 3.  Grossly intact. Skin: Warm and dry, no jaundice.   Psych: Alert and cooperative, normal mood and affect.   Imaging Studies: Ct Chest W Contrast  Result Date: 08/31/2017 CLINICAL DATA:  Left renal cell carcinoma with left lung metastases. On chemotherapy. Worsening fatigue and shortness of breath on exertion. EXAM: CT CHEST, ABDOMEN, AND PELVIS WITH CONTRAST TECHNIQUE: Multidetector CT imaging of the chest, abdomen and pelvis was performed following the standard protocol during bolus administration of intravenous contrast. CONTRAST:  20m ISOVUE-300 IOPAMIDOL (ISOVUE-300) INJECTION 61% COMPARISON:  06/07/2017. FINDINGS: CT CHEST FINDINGS Cardiovascular: Atherosclerotic calcification of the arterial vasculature, including  coronary arteries. Heart size normal. No pericardial effusion. Mediastinum/Nodes: Subcarinal lymph node measures 2.4 cm, stable when remeasured. Bihilar lymph nodes measure up to 3.0 x 3.0 cm on the left, also stable when remeasured. No axillary adenopathy. Esophagus is grossly unremarkable. Lungs/Pleura: Image quality is degraded by motion. Hematogenously distributed pulmonary nodules are stable in size and number, measuring up to 2.4 x 2.8 cm in the left lower lobe (series 3, image 87). No pleural fluid. There is narrowing of the left lower lobe bronchus due to extrinsic mass effect from left hilar adenopathy. Musculoskeletal: No worrisome lytic or sclerotic lesions. CT ABDOMEN PELVIS FINDINGS Hepatobiliary: Liver margin is irregular. Stones are seen in the gallbladder. No biliary ductal dilatation. Pancreas: Negative. Spleen: Measures 18.4 cm. Adrenals/Urinary Tract: Right adrenal gland and right kidney are unremarkable. Left adrenalectomy and left nephrectomy. Bladder is low in volume. Stomach/Bowel: Stomach, small bowel and majority of the colon are unremarkable. Difficult to exclude a polypoid lesion in the proximal descending colon (series 2, image 65). Vascular/Lymphatic: Atherosclerotic calcification of the arterial vasculature. Numerous varices. Scattered lymph nodes are not enlarged by CT size criteria. Reproductive: Prostate is visualized.  Bilateral scrotal hydroceles. Other: Massive ascites, increased from 06/07/2017. Midline ventral hernias contain fluid and varices. Large left inguinal hernia contains fluid. Musculoskeletal: No worrisome lytic or sclerotic lesions. IMPRESSION: 1. Mediastinal/hilar adenopathy and pulmonary metastatic disease, stable from 06/07/2017. 2. Very large ascites, increased from 06/07/2017. 3. Cirrhosis with portal hypertension. 4. Difficult to exclude a polypoid lesion in the proximal descending colon. 5. Cholelithiasis. 6. Large ventral and left inguinal hernias containing  fluid. 7.  Aortic atherosclerosis (ICD10-170.0). Electronically Signed   By: MLorin PicketM.D.   On: 08/31/2017 11:43   Ct Abdomen Pelvis W Contrast  Result Date: 09/20/2017 CLINICAL DATA:  Bilateral lower abdominal pain radiating across the lower abdomen. Patient was released from hospital Wednesday after paracentesis. Patient has had umbilical hernia for years. EXAM: CT ABDOMEN AND PELVIS WITH CONTRAST TECHNIQUE: Multidetector CT imaging of the abdomen and pelvis was performed using the standard protocol following bolus administration of intravenous contrast. CONTRAST:  1040mISOVUE-300 IOPAMIDOL (ISOVUE-300) INJECTION 61% COMPARISON:  09/13/2017 CT FINDINGS: Lower chest: Normal heart size without pericardial effusion. Redemonstration of dominant pulmonary mass in the  left lower lobe measuring approximately 2.7 x 2.4 cm allowing for slice sampling differences in operator dependent imaging. Smaller scattered pulmonary nodules are also present within both lower lobes. Trace right greater than left pleural effusions. Hepatobiliary: Cirrhotic appearance of the liver with heterogeneous somewhat wedge-shaped area of hypo density involving the medial segment of left hepatic lobe but without definable mass. Nondistended gallbladder with layering gallstones. No biliary dilatation. Pancreas: Unremarkable Spleen: Enlarged up to 16.5 cm without focal mass stable since recent. Adrenals/Urinary Tract: Surgically absent left adrenal gland and kidney. The right adrenal gland and right kidney are unremarkable. Stomach/Bowel: The stomach is decompressed in appearance. The duodenal sweep and ligament of Treitz are normal in position. No small bowel dilatation or obstruction is identified. Mildly edematous appearance of the small bowel likely sympathetic from surrounding ascites. Likewise there is somewhat thickened appearance of the cecum and ascending colon since prior with submucosal fatty change. Possibility of a colitis  is not entirely excluded. Centralized loops of small bowel secondary to large volume of ascites. No bowel obstruction. Vascular/Lymphatic: Nonaneurysmal thoracic aorta with atherosclerosis. No adenopathy. Multiple upper abdominal varices. Reproductive: Mildly enlarged prostate. Other: Ventral hernia containing loculated fluid is again redemonstrated without definite herniation of bowel given lack of more proximal bowel this measures 10.6 x 9.6 x 2.7 cm (transverse by CC by AP dimension) with the mouth of hernia measuring 4.2 x 1.7 cm in transverse by craniocaudad. Previously this hernia was estimated at 9.5 cm transverse by 2.7 cm in thickness. Prominent enhancing omental vessels project into the hernia as before. Moderate to large volume of ascites redemonstrated. Musculoskeletal: Degenerative disc disease L3-4 and L4-5. IMPRESSION: 1. Moderate to large volume of ascites despite recent paracentesis suggests reaccumulation of fluid given similar appearance of the abdomen and pelvis 09/13/2017 (patient had a paracentesis performed on 09/16/2017). 2. Cirrhotic liver with splenomegaly and stigmata of portal hypertension. 3. Slightly wedge shaped perfusion anomaly of the medial segment of left hepatic lobe similar to prior. A mass cannot be entirely excluded. 4. Stable pulmonary nodules with dominant mass in left lower lobe. 5. Large multiloculated fluid containing ventral hernia with herniation of omental vessels as before currently estimated at 10.6 x 9.6 x 2.7 cm. This appears more prominent than on prior. Electronically Signed   By: Ashley Royalty M.D.   On: 09/20/2017 21:01   Ct Abdomen Pelvis W Contrast  Result Date: 09/13/2017 CLINICAL DATA:  Abdominal hernia with liver failure EXAM: CT ABDOMEN AND PELVIS WITH CONTRAST TECHNIQUE: Multidetector CT imaging of the abdomen and pelvis was performed using the standard protocol following bolus administration of intravenous contrast. CONTRAST:  147m ISOVUE-300  IOPAMIDOL (ISOVUE-300) INJECTION 61% COMPARISON:  CT 08/30/2017, 06/07/2017, PET CT 10/15/2016 FINDINGS: Lower chest: Lung bases again demonstrate multiple pulmonary nodules. Left lower lobe lung mass measures 2.6 x 2.8 cm, compared with 2.4 x 2.8 cm previously. Multiple additional small lung nodules in the lung bases are again noted. No pleural effusion. The heart size is normal. Hepatobiliary: Cirrhotic liver. Heterogenous hypodensity at the junction of the right and left lobes without well defined mass. Appearance is change from 08/30/2017. Calcified gallstone. No biliary dilatation. Pancreas: Unremarkable. No pancreatic ductal dilatation or surrounding inflammatory changes. Spleen: Enlarged, measuring 16 cm. Adrenals/Urinary Tract: Right adrenal gland and kidney are normal. Status post removal of left adrenal gland and left kidney. Bladder is slightly thick walled. Stomach/Bowel: The stomach is nonenlarged. No dilated small bowel. No colon wall thickening. Negative appendix. Vascular/Lymphatic: Nonaneurysmal aorta. Moderate aortic  atherosclerosis. No significant adenopathy. Multiple upper abdominal varices. Reproductive: Slightly enlarged prostate Other: Moderate to large volume of ascites in the abdomen. Ventral hernia containing slightly loculated fluid measuring 9.5 x 2.1 cm, appears decreased compared to most recent CT. Prominent vessels are noted in the hernia. Large left inguinal fluid collection in addition to large left and small right hydrocele. Musculoskeletal: Degenerative changes. No acute or suspicious abnormality. IMPRESSION: 1. Periumbilical ventral hernia containing slightly loculated fluid collection measuring approximately 9.5 x 2.7 cm, slight lead decreased as compared with the most recent CT. Prominent enhancing vessels are noted in the hernia. 2. Cirrhosis of the liver with portal hypertension, splenomegaly and varices. Moderate to large volume of ascites within the abdomen and pelvis. 3.  New heterogenous hypoenhancement within the liver, possible mass versus altered perfusion dynamics. 4. Essentially stable pulmonary nodules within the lower lobes. 5. Status post left nephrectomy and removal of left adrenal gland 6. Gallstones 7. Large left inguinal fluid collection and large left hydrocele with small right hydrocele Electronically Signed   By: Donavan Foil M.D.   On: 09/13/2017 23:20   Ct Abdomen Pelvis W Contrast  Result Date: 08/31/2017 CLINICAL DATA:  Left renal cell carcinoma with left lung metastases. On chemotherapy. Worsening fatigue and shortness of breath on exertion. EXAM: CT CHEST, ABDOMEN, AND PELVIS WITH CONTRAST TECHNIQUE: Multidetector CT imaging of the chest, abdomen and pelvis was performed following the standard protocol during bolus administration of intravenous contrast. CONTRAST:  95m ISOVUE-300 IOPAMIDOL (ISOVUE-300) INJECTION 61% COMPARISON:  06/07/2017. FINDINGS: CT CHEST FINDINGS Cardiovascular: Atherosclerotic calcification of the arterial vasculature, including coronary arteries. Heart size normal. No pericardial effusion. Mediastinum/Nodes: Subcarinal lymph node measures 2.4 cm, stable when remeasured. Bihilar lymph nodes measure up to 3.0 x 3.0 cm on the left, also stable when remeasured. No axillary adenopathy. Esophagus is grossly unremarkable. Lungs/Pleura: Image quality is degraded by motion. Hematogenously distributed pulmonary nodules are stable in size and number, measuring up to 2.4 x 2.8 cm in the left lower lobe (series 3, image 87). No pleural fluid. There is narrowing of the left lower lobe bronchus due to extrinsic mass effect from left hilar adenopathy. Musculoskeletal: No worrisome lytic or sclerotic lesions. CT ABDOMEN PELVIS FINDINGS Hepatobiliary: Liver margin is irregular. Stones are seen in the gallbladder. No biliary ductal dilatation. Pancreas: Negative. Spleen: Measures 18.4 cm. Adrenals/Urinary Tract: Right adrenal gland and right kidney  are unremarkable. Left adrenalectomy and left nephrectomy. Bladder is low in volume. Stomach/Bowel: Stomach, small bowel and majority of the colon are unremarkable. Difficult to exclude a polypoid lesion in the proximal descending colon (series 2, image 65). Vascular/Lymphatic: Atherosclerotic calcification of the arterial vasculature. Numerous varices. Scattered lymph nodes are not enlarged by CT size criteria. Reproductive: Prostate is visualized.  Bilateral scrotal hydroceles. Other: Massive ascites, increased from 06/07/2017. Midline ventral hernias contain fluid and varices. Large left inguinal hernia contains fluid. Musculoskeletal: No worrisome lytic or sclerotic lesions. IMPRESSION: 1. Mediastinal/hilar adenopathy and pulmonary metastatic disease, stable from 06/07/2017. 2. Very large ascites, increased from 06/07/2017. 3. Cirrhosis with portal hypertension. 4. Difficult to exclude a polypoid lesion in the proximal descending colon. 5. Cholelithiasis. 6. Large ventral and left inguinal hernias containing fluid. 7.  Aortic atherosclerosis (ICD10-170.0). Electronically Signed   By: MLorin PicketM.D.   On: 08/31/2017 11:43   [place]acentesis  Result Date: 09/16/2017 CLINICAL DATA:  Cirrhosis, recurrent large volume abdominal ascites. He had some skin breakdown of his umbilical hernia resulting in leakage of a fair amount  of peritoneal fluid while at home. EXAM: ULTRASOUND GUIDED PARACENTESIS TECHNIQUE: The procedure, risks (including but not limited to bleeding, infection, organ damage ), benefits, and alternatives were explained to the patient. Questions regarding the procedure were encouraged and answered. The patient understands and consents to the procedure. Survey ultrasound of the abdomen was performed and an appropriate skin entry site in the lateral right lower abdomen was selected. Skin site was marked, prepped with chlorhexadine, draped in usual sterile fashion, and infiltrated locally with 1%  lidocaine. A Safe-T-Centesis needle was advanced into the peritoneal space until fluid could be aspirated. The sheath was advanced and the needle removed. 2 L of clear yellow ascites were aspirated. The patient tolerated the procedure well. COMPLICATIONS: COMPLICATIONS none IMPRESSION: Technically successful ultrasound guided paracentesis, removing 2 L ascites. Electronically Signed   By: Lucrezia Europe M.D.   On: 09/16/2017 12:54   [place] Ekg Site Rite  Result Date: 09/15/2017 If Site Rite image not attached, placement could not be confirmed due to current cardiac rhythm.   Assessment and Plan:   Rodney Stewart is a 62 y.o. y/o male here to follow upfor chronic liver disease/cirhosis, ascites likely secondary to alcohol. No encephelopathy. Elevated alkaline phosphotase likely from metastatic RCC. Sensitive creatinine and hard to manage with diuretics as raising the doses of diuretics needs to worsening of his kidney function and hypotension .  He is not a candidate for  liver transplant due to metastatic malignancy. His transaminases are not elevated.  INR was 1.1 -10 days back.  Plan  1..Low salt diet, avoid tylenol 2. He has been referred to palliative care as an outpatient, awaiting his appointment, this would be very appropriate as there is no clear cure for his present condition as he has metastatic malignancy and decompensated liver disease  3.  In terms of his ascites going to refer him to [facility] to determine if he would be a candidate for TIPS procedure which would also improve his kidney function.  4. Commence on low dose aldactobe 25 mg today - recheck BMP in 3 days, if tolerates it will gradually go up on aldactone alone . He has an appointment with nephrology in October  5. Refer to surgery at [facility] to evaluate Hydrocele which is causing significant discomfort.   Dr Jonathon Bellows  MD,MRCP [facility]) Follow up in 2 weeks

## 2017-09-23 NOTE — Telephone Encounter (Signed)
EMMI Follow-up: Rodney Stewart said he had received a call from my number and I explained our process of two automated calls post discharge and to expect the second one in a day or two.  Said he had his Rx filled and been to his first follow-up appointment today.  Is aware of other upcoming appointments at the [facility]. No needs noted for today.

## 2017-09-23 NOTE — Progress Notes (Signed)
Pre visit review using our clinic review tool, if applicable. No additional management support is needed unless otherwise documented below in the visit note. 

## 2017-09-23 NOTE — Telephone Encounter (Signed)
Spoke to pt and informed him of general surgery referral to Dr. Dahlia Byes for evaluation of Hydrocele. Pt appointment has been scheduled for 10-02-17 at 3:15 pm with a 3:00 arrival at [facility].

## 2017-09-23 NOTE — Patient Instructions (Signed)
Please see Dr. Jonathon Stewart today 1 pm go to his office   Cirrhosis Cirrhosis is long-term (chronic) liver injury. The liver is your largest internal organ, and it performs many functions. The liver converts food into energy, removes toxic material from your blood, makes important proteins, and absorbs necessary vitamins from your diet. If you have cirrhosis, it means many of your healthy liver cells have been replaced by scar tissue. This prevents blood from flowing through your liver, which makes it difficult for your liver to function. This scarring is not reversible, but treatment can prevent it from getting worse. What are the causes? Hepatitis C and long-term alcohol abuse are the most common causes of cirrhosis. Other causes include:  Nonalcoholic fatty liver disease.  Hepatitis B infection.  Autoimmune hepatitis.  Diseases that cause blockage of ducts inside the liver.  Inherited liver diseases.  Reactions to certain long-term medicines.  Parasitic infections.  Long-term exposure to certain toxins.  What increases the risk? You may have a higher risk of cirrhosis if you:  Have certain hepatitis viruses.  Abuse alcohol, especially if you are male.  Are overweight.  Share needles.  Have unprotected sex with someone who has hepatitis.  What are the signs or symptoms? You may not have any signs and symptoms at first. Symptoms may not develop until the damage to your liver starts to get worse. Signs and symptoms of cirrhosis may include:  Tenderness in the right-upper part of your abdomen.  Weakness and tiredness (fatigue).  Loss of appetite.  Nausea.  Weight loss and muscle loss.  Itchiness.  Yellow skin and eyes (jaundice).  Buildup of fluid in the abdomen (ascites).  Swelling of the feet and ankles (edema).  Appearance of tiny blood vessels under the skin.  Mental confusion.  Easy bruising and bleeding.  How is this diagnosed? Your health care  provider may suspect cirrhosis based on your symptoms and medical history, especially if you have other medical conditions or a history of alcohol abuse. Your health care provider will do a physical exam to feel your liver and check for signs of cirrhosis. Your health care provider may perform other tests, including:  Blood tests to check: ? Whether you have hepatitis B or C. ? Kidney function. ? Liver function.  Imaging tests such as: ? MRI or CT scan to look for changes seen in advanced cirrhosis. ? Ultrasound to see if normal liver tissue is being replaced by scar tissue.  A procedure using a long needle to take a sample of liver tissue (biopsy) for examination under a microscope. Liver biopsy can confirm the diagnosis of cirrhosis.  How is this treated? Treatment depends on how damaged your liver is and what caused the damage. Treatment may include treating cirrhosis symptoms or treating the underlying causes of the condition to try to slow the progression of the damage. Treatment may include:  Making lifestyle changes, such as: ? Eating a healthy diet. ? Restricting salt intake. ? Maintaining a healthy weight. ? Not abusing drugs or alcohol.  Taking medicines to: ? Treat liver infections or other infections. ? Control itching. ? Reduce fluid buildup. ? Reduce certain blood toxins. ? Reduce risk of bleeding from enlarged blood vessels in the stomach or esophagus (varices).  If varices are causing bleeding problems, you may need treatment with a procedure that ties up the vessels causing them to fall off (band ligation).  If cirrhosis is causing your liver to fail, your health care provider may  recommend a liver transplant.  Other treatments may be recommended depending on any complications of cirrhosis, such as liver-related kidney failure (hepatorenal syndrome).  Follow these instructions at home:  Take medicines only as directed by your health care provider. Do not use  drugs that are toxic to your liver. Ask your health care provider before taking any new medicines, including over-the-counter medicines.  Rest as needed.  Eat a well-balanced diet. Ask your health care provider or dietitian for more information.  You may have to follow a low-salt diet or restrict your water intake as directed.  Do not drink alcohol. This is especially important if you are taking acetaminophen.  Keep all follow-up visits as directed by your health care provider. This is important. Contact a health care provider if:  You have fatigue or weakness that is getting worse.  You develop swelling of the hands, feet, legs, or face.  You have a fever.  You develop loss of appetite.  You have nausea or vomiting.  You develop jaundice.  You develop easy bruising or bleeding. Get help right away if:  You vomit bright red blood or a material that looks like coffee grounds.  You have blood in your stools.  Your stools appear black and tarry.  You become confused.  You have chest pain or trouble breathing. This information is not intended to replace advice given to you by your health care provider. Make sure you discuss any questions you have with your health care provider. Document Released: 12/18/2004 Document Revised: 04/28/2015 Document Reviewed: 08/26/2013 Elsevier Interactive Patient Education  [employer].

## 2017-09-23 NOTE — Patient Instructions (Signed)

## 2017-09-24 ENCOUNTER — Telehealth: Payer: Self-pay

## 2017-09-24 ENCOUNTER — Telehealth: Payer: Self-pay | Admitting: Gastroenterology

## 2017-09-24 ENCOUNTER — Other Ambulatory Visit: Payer: Self-pay | Admitting: Gastroenterology

## 2017-09-24 ENCOUNTER — Other Ambulatory Visit: Payer: Self-pay

## 2017-09-24 ENCOUNTER — Telehealth: Payer: Self-pay | Admitting: Internal Medicine

## 2017-09-24 DIAGNOSIS — R69 Illness, unspecified: Secondary | ICD-10-CM | POA: Diagnosis not present

## 2017-09-24 DIAGNOSIS — K7031 Alcoholic cirrhosis of liver with ascites: Secondary | ICD-10-CM

## 2017-09-24 DIAGNOSIS — E1142 Type 2 diabetes mellitus with diabetic polyneuropathy: Secondary | ICD-10-CM

## 2017-09-24 DIAGNOSIS — Z794 Long term (current) use of insulin: Secondary | ICD-10-CM

## 2017-09-24 LAB — PROTIME-INR
INR: 1.2 (ref 0.8–1.2)
Prothrombin Time: 12.4 s — ABNORMAL HIGH (ref 9.1–12.0)

## 2017-09-24 LAB — COMPREHENSIVE METABOLIC PANEL
A/G RATIO: 1 — AB (ref 1.2–2.2)
ALBUMIN: 2.8 g/dL — AB (ref 3.6–4.8)
ALT: 22 IU/L (ref 0–44)
AST: 34 IU/L (ref 0–40)
Alkaline Phosphatase: 430 IU/L — ABNORMAL HIGH (ref 39–117)
BILIRUBIN TOTAL: 3.9 mg/dL — AB (ref 0.0–1.2)
BUN / CREAT RATIO: 26 — AB (ref 10–24)
BUN: 39 mg/dL — AB (ref 8–27)
CALCIUM: 7.9 mg/dL — AB (ref 8.6–10.2)
CHLORIDE: 85 mmol/L — AB (ref 96–106)
CO2: 22 mmol/L (ref 20–29)
Creatinine, Ser: 1.51 mg/dL — ABNORMAL HIGH (ref 0.76–1.27)
GFR calc Af Amer: 57 mL/min/{1.73_m2} — ABNORMAL LOW (ref >59)
GFR, EST NON AFRICAN AMERICAN: 49 mL/min/{1.73_m2} — AB (ref >59)
Globulin, Total: 2.8 g/dL (ref 1.5–4.5)
Glucose: 139 mg/dL — ABNORMAL HIGH (ref 65–99)
Potassium: 5.5 mmol/L — ABNORMAL HIGH (ref 3.5–5.2)
Sodium: 122 mmol/L — ABNORMAL LOW (ref 134–144)
TOTAL PROTEIN: 5.6 g/dL — AB (ref 6.0–8.5)

## 2017-09-24 MED ORDER — INSULIN ASPART 100 UNIT/ML FLEXPEN: PEN_INJECTOR | SUBCUTANEOUS | 0 refills | Status: DC

## 2017-09-24 NOTE — Telephone Encounter (Signed)
Called pt and informed him that Dr. Vicente Males wants him to have labs repeated today. Pt agreed to come in sometime this afternoon.

## 2017-09-24 NOTE — Telephone Encounter (Signed)
Middleton, Vickie  Nasteho Glantz, CMA        Thank you, I have him scheduled with Dr. Annamaria Boots on Tues, Oct 15 at 2pm.   Previous Messages    ----- Message -----  From: Glennie Isle, CMA  Sent: 09/24/2017 12:51 PM EDT  To: Marcelyn Bruins  Subject: RE: referral for TIPS

## 2017-09-24 NOTE — Telephone Encounter (Signed)
Pt needs a refill on insulin aspart (NOVOLOG) 100 UNIT/ML  Sent to [facility] [address] Pt states he is completely out

## 2017-09-24 NOTE — Telephone Encounter (Signed)
Copied from [place] [id number] [id number]. Topic: Referral - Question >> Sep 24, 2017 10:47 AM Judyann Munson wrote: Reason for CRM: Sharyn Lull from [facility]  is calling to request a call back in regards to the patient referral. Sharyn Lull stated the patient already has an appt schedule for 10-02-17 at different office, for the same concerns, she wanting to confirm if the patient still needs this referral. Best contact number is [phone] or [phone]. Please advise

## 2017-09-24 NOTE — Telephone Encounter (Signed)
Returned pt call in regards to him being referred to [facility] and [facility], both for the same issue. I informed the pt that Dr. Vicente Males suggested going through with [facility], pt agrees. We have cancelled referral and appointment with [facility].

## 2017-09-24 NOTE — Telephone Encounter (Signed)
Please call patient as he is confused. He's had [facility] and [facility] call and Rodney Stewart is confused about which one to go to, please called.

## 2017-09-24 NOTE — Telephone Encounter (Signed)
Spoke with pt to inform him of lab results and Dr. Georgeann Oppenheim instructions to stop the aldactone that was prescribed during last office visit. Pt is aware that he will need to return to our office or go to [facility] to repeat labs.

## 2017-09-24 NOTE — Telephone Encounter (Signed)
-----   Message from Jonathon Bellows, MD sent at 09/24/2017  8:05 AM EDT ----- Rodney Stewart  Inform the patient potassium is high and he should not take the aldactone that I started yesterday. Please confirm please.   Dr Jonathon Bellows MD,MRCP [facility]) Gastroenterology/Hepatology Pager: [phone]

## 2017-09-25 ENCOUNTER — Inpatient Hospital Stay: Payer: Medicare HMO

## 2017-09-25 ENCOUNTER — Ambulatory Visit: Payer: Medicare HMO

## 2017-09-25 ENCOUNTER — Other Ambulatory Visit: Payer: Self-pay

## 2017-09-25 ENCOUNTER — Other Ambulatory Visit: Payer: Self-pay | Admitting: Oncology

## 2017-09-25 ENCOUNTER — Inpatient Hospital Stay (HOSPITAL_BASED_OUTPATIENT_CLINIC_OR_DEPARTMENT_OTHER): Payer: Medicare HMO | Admitting: Oncology

## 2017-09-25 ENCOUNTER — Ambulatory Visit
Admission: RE | Admit: 2017-09-25 | Discharge: 2017-09-25 | Disposition: A | Discharge status: Home / Self Care | Payer: Medicare HMO | Source: Ambulatory Visit | Attending: Oncology | Admitting: Oncology

## 2017-09-25 VITALS — BP 105/70 | HR 95 | Temp 97.0°F | Resp 22 | Wt 161.0 lb

## 2017-09-25 DIAGNOSIS — C7801 Secondary malignant neoplasm of right lung: Secondary | ICD-10-CM | POA: Diagnosis not present

## 2017-09-25 DIAGNOSIS — N5089 Other specified disorders of the male genital organs: Secondary | ICD-10-CM

## 2017-09-25 DIAGNOSIS — I700 Atherosclerosis of aorta: Secondary | ICD-10-CM | POA: Diagnosis not present

## 2017-09-25 DIAGNOSIS — M109 Gout, unspecified: Secondary | ICD-10-CM | POA: Diagnosis not present

## 2017-09-25 DIAGNOSIS — R590 Localized enlarged lymph nodes: Secondary | ICD-10-CM | POA: Diagnosis not present

## 2017-09-25 DIAGNOSIS — Z5112 Encounter for antineoplastic immunotherapy: Secondary | ICD-10-CM | POA: Diagnosis not present

## 2017-09-25 DIAGNOSIS — K7460 Unspecified cirrhosis of liver: Secondary | ICD-10-CM

## 2017-09-25 DIAGNOSIS — D61818 Other pancytopenia: Secondary | ICD-10-CM | POA: Diagnosis not present

## 2017-09-25 DIAGNOSIS — E871 Hypo-osmolality and hyponatremia: Secondary | ICD-10-CM | POA: Diagnosis not present

## 2017-09-25 DIAGNOSIS — N433 Hydrocele, unspecified: Secondary | ICD-10-CM | POA: Diagnosis not present

## 2017-09-25 DIAGNOSIS — N400 Enlarged prostate without lower urinary tract symptoms: Secondary | ICD-10-CM | POA: Diagnosis not present

## 2017-09-25 DIAGNOSIS — C649 Malignant neoplasm of unspecified kidney, except renal pelvis: Secondary | ICD-10-CM

## 2017-09-25 DIAGNOSIS — C642 Malignant neoplasm of left kidney, except renal pelvis: Secondary | ICD-10-CM

## 2017-09-25 DIAGNOSIS — Z79899 Other long term (current) drug therapy: Secondary | ICD-10-CM | POA: Diagnosis not present

## 2017-09-25 DIAGNOSIS — C7802 Secondary malignant neoplasm of left lung: Secondary | ICD-10-CM

## 2017-09-25 DIAGNOSIS — K219 Gastro-esophageal reflux disease without esophagitis: Secondary | ICD-10-CM | POA: Diagnosis not present

## 2017-09-25 DIAGNOSIS — R188 Other ascites: Secondary | ICD-10-CM | POA: Diagnosis not present

## 2017-09-25 DIAGNOSIS — E1140 Type 2 diabetes mellitus with diabetic neuropathy, unspecified: Secondary | ICD-10-CM | POA: Diagnosis not present

## 2017-09-25 DIAGNOSIS — R69 Illness, unspecified: Secondary | ICD-10-CM | POA: Diagnosis not present

## 2017-09-25 DIAGNOSIS — Z87891 Personal history of nicotine dependence: Secondary | ICD-10-CM | POA: Diagnosis not present

## 2017-09-25 DIAGNOSIS — E1122 Type 2 diabetes mellitus with diabetic chronic kidney disease: Secondary | ICD-10-CM | POA: Diagnosis not present

## 2017-09-25 DIAGNOSIS — N183 Chronic kidney disease, stage 3 (moderate): Secondary | ICD-10-CM | POA: Diagnosis not present

## 2017-09-25 DIAGNOSIS — I129 Hypertensive chronic kidney disease with stage 1 through stage 4 chronic kidney disease, or unspecified chronic kidney disease: Secondary | ICD-10-CM | POA: Diagnosis not present

## 2017-09-25 DIAGNOSIS — K8020 Calculus of gallbladder without cholecystitis without obstruction: Secondary | ICD-10-CM | POA: Diagnosis not present

## 2017-09-25 DIAGNOSIS — D649 Anemia, unspecified: Secondary | ICD-10-CM | POA: Diagnosis not present

## 2017-09-25 DIAGNOSIS — Z905 Acquired absence of kidney: Secondary | ICD-10-CM | POA: Diagnosis not present

## 2017-09-25 DIAGNOSIS — Z7982 Long term (current) use of aspirin: Secondary | ICD-10-CM | POA: Diagnosis not present

## 2017-09-25 DIAGNOSIS — E875 Hyperkalemia: Secondary | ICD-10-CM | POA: Diagnosis not present

## 2017-09-25 LAB — BASIC METABOLIC PANEL
BUN/Creatinine Ratio: 27 — ABNORMAL HIGH (ref 10–24)
BUN: 37 mg/dL — AB (ref 8–27)
CALCIUM: 8.1 mg/dL — AB (ref 8.6–10.2)
CHLORIDE: 85 mmol/L — AB (ref 96–106)
CO2: 23 mmol/L (ref 20–29)
CREATININE: 1.35 mg/dL — AB (ref 0.76–1.27)
GFR calc Af Amer: 65 mL/min/{1.73_m2} (ref >59)
GFR calc non Af Amer: 56 mL/min/{1.73_m2} — ABNORMAL LOW (ref >59)
GLUCOSE: 133 mg/dL — AB (ref 65–99)
Potassium: 5.5 mmol/L — ABNORMAL HIGH (ref 3.5–5.2)
Sodium: 121 mmol/L — ABNORMAL LOW (ref 134–144)

## 2017-09-25 LAB — COMPREHENSIVE METABOLIC PANEL
ALT: 19 U/L (ref 0–44)
AST: 29 U/L (ref 15–41)
Albumin: 2.3 g/dL — ABNORMAL LOW (ref 3.5–5.0)
Alkaline Phosphatase: 347 U/L — ABNORMAL HIGH (ref 38–126)
Anion gap: 8 (ref 5–15)
BUN: 38 mg/dL — ABNORMAL HIGH (ref 8–23)
CHLORIDE: 86 mmol/L — AB (ref 98–111)
CO2: 26 mmol/L (ref 22–32)
CREATININE: 1.48 mg/dL — AB (ref 0.61–1.24)
Calcium: 8.1 mg/dL — ABNORMAL LOW (ref 8.9–10.3)
GFR calc non Af Amer: 49 mL/min — ABNORMAL LOW (ref >60)
GFR, EST AFRICAN AMERICAN: 57 mL/min — AB (ref >60)
Glucose, Bld: 303 mg/dL — ABNORMAL HIGH (ref 70–99)
Potassium: 5.1 mmol/L (ref 3.5–5.1)
Sodium: 120 mmol/L — ABNORMAL LOW (ref 135–145)
Total Bilirubin: 2.9 mg/dL — ABNORMAL HIGH (ref 0.3–1.2)
Total Protein: 6.1 g/dL — ABNORMAL LOW (ref 6.5–8.1)

## 2017-09-25 LAB — CBC WITH DIFFERENTIAL/PLATELET
BASOS PCT: 1 %
Basophils Absolute: 0 10*3/uL (ref 0–0.1)
EOS ABS: 0.1 10*3/uL (ref 0–0.7)
EOS PCT: 2 %
HCT: 31.4 % — ABNORMAL LOW (ref 40.0–52.0)
Hemoglobin: 10.6 g/dL — ABNORMAL LOW (ref 13.0–18.0)
LYMPHS PCT: 9 %
Lymphs Abs: 0.4 10*3/uL — ABNORMAL LOW (ref 1.0–3.6)
MCH: 28.5 pg (ref 26.0–34.0)
MCHC: 33.8 g/dL (ref 32.0–36.0)
MCV: 84.5 fL (ref 80.0–100.0)
Monocytes Absolute: 0.5 10*3/uL (ref 0.2–1.0)
Monocytes Relative: 10 %
NEUTROS ABS: 3.7 10*3/uL (ref 1.4–6.5)
NEUTROS PCT: 78 %
PLATELETS: 176 10*3/uL (ref 150–440)
RBC: 3.72 MIL/uL — AB (ref 4.40–5.90)
RDW: 16.3 % — ABNORMAL HIGH (ref 11.5–14.5)
WBC: 4.7 10*3/uL (ref 3.8–10.6)

## 2017-09-25 MED ORDER — OXYCODONE HCL 5 MG PO TABS: 5.0000 mg | ORAL_TABLET | Freq: Four times a day (QID) | ORAL | 0 refills | Status: DC | PRN

## 2017-09-25 MED ORDER — OXYCODONE-ACETAMINOPHEN 5-325 MG PO TABS
2.0000 | ORAL_TABLET | Freq: Once | ORAL | Status: AC
  Administered 2017-09-25: 2 via ORAL
  Filled 2017-09-25: qty 2

## 2017-09-25 NOTE — Progress Notes (Signed)
Symptom Management Consult note [facility]  [phone] [phone]  Patient Care Team: [name], [name], MD as PCP - General (Internal Medicine)   Name of the patient: Rodney Stewart  [id number]  1955/01/15   Date of visit: 09/25/2017  Diagnosis: Scrotal swelling - Plan: US SCROTUM W/DOPPLER, CANCELED: US SCROTUM, CANCELED: US SCROTUM W/DOPPLER  Cirrhosis of liver with ascites, unspecified hepatic cirrhosis type ([place])  Secondary renal cell carcinoma of left lung [facility])  Chief Complaint: Groin pain/swelling, hx of large hydrocele  Current Treatment: s/p cycle 16 Opdivo  Oncology History: Patient was last seen by primary oncologist Dr. Grayland Ormond on 09/11/2017 discuss imaging results and treatment planning.  He had recently fallen off a ladder and injured his left shoulder but otherwise was doing well.  Imaging revealed stable disease.  He continued to have large amounts of abdominal ascites.  He was referred to GI for further management of frequent paracentesis.  Since that time he has been admitted to the hospital twice.  First admission was from 09/13/2017-09/18/17 where he was admitted for hyponatremia, ascites, malnutrition and hypovolemic shock d/t large amounts of drainage from ascities. He was started on pressors d/t severe hypotension.  All of his blood pressure medicines were discontinued except for his beta-blocker with close monitoring by his PCP.  He was started on IV Rocephin while inpatient for abdominal wall infection d/t large incarcerated hernia and possible SBP.  He required several paracentesis where total of 2 L of fluid was removed.  Treated for hyperkalemia with Kayexalate, dextrose and insulin.   He was seen again in the emergency department on 09/20/2017 for worsening abdominal pain.  CT scan was performed revealing unremarkable and no acute findings. It did reveal recurrent ascites which was thought to potentially be causing  abdominal pain.  He was given pain medication and scheduled for outpatient follow-up given no respiratory compromise.  He was started on oxycodone given poor liver and kidney function.  He was evaluated on 09/23/2017 by GI (Dr. Iona Coach PCP.  Primary care doctor was worried about fluid overload given recurrent extensive ascites.  He was started on Aldactone 25 mg daily and instructed to have labs rechecked in 3 days.  [facility].  Was also referred to [facility] to determine if he is a candidate for TIPS procedure given frequent paracentesis.   Oncology History   Patient underwent left nephrectomy on May 16, 2012 which revealed a clear cell grade 2 renal cell carcinoma, stage TIIIa, N0, M0. Tumor size of 16 cm. Patient was noted to have renal vein involvement, but no other structures were involved. 0 of 2 lymph nodes were negative for disease. PET scan on October 20, 2015 revealed metastatic disease and patient was initiated on Votrient. This was subsequently discontinued in March 2018 secondary to progression of disease. Patient initiated second line treatment with nivolumab on Monday, April 02, 2016     Metastatic renal cell carcinoma to lung [facility])    Initial Diagnosis    Metastatic renal cell carcinoma to lung [facility])     Subjective Data:  Subjective:    Rodney Stewart is a 62 y.o. male who presents for evaluation of edema in scrotum. The edema has been severe. Onset of symptoms was several weeks ago, and patient reports symptoms have gradually worsened since that time. The edema is present all day. The patient states the problem is long-standing. The swelling has been aggravated by dependency  of involved area. The swelling has been relieved by nothing. Associated factors include: hx of hydrocele and known cirrhosis and metastatic kidney cancer. Cardiac risk factors: none.  The following portions of the patient's history were reviewed and updated  as appropriate: allergies, current medications, past family history, past medical history, past social history, past surgical history and problem list.  Review of Systems A comprehensive review of systems was negative except for: Constitutional: positive for anorexia and fatigue Respiratory: positive for dyspnea on exertion Gastrointestinal: positive for abdominal pain and ascites Genitourinary: positive for hydrocele large Musculoskeletal: positive for muscle weakness   Objective:    BP 105/70 (BP Location: Right Arm, Patient Position: Sitting)   Pulse 95   Temp (!) 97 F (36.1 C) (Tympanic)   Resp (!) 22   Wt 161 lb (73 kg)   SpO2 100%   BMI 22.45 kg/m  General appearance: alert, cachectic, fatigued and mild distress Abdomen: abnormal findings:  ascites, distended and umbilical hernia Male genitalia: normal, abnormal findings: hydrocele left    Imaging Ultrasound of left scrotum: normal chest x-ray and prsence of large hydrocele   Assessment:     Edema secondary to ascites and known hydrocele.    R/O testicular torison  Plan:    Recommendations: Stat Ultrasound of scrotum. Has scheduled follow-up with urology tomorrow for known hydrocele.   Stage IV renal cell carcinoma bilateral lung metastasis: s/p Opdivo infusions q 2 weeks since 04/02/16. Tolerating well.  Most recent scan is from August 30, 2017 revealing stable mediastinal/hilar adenopathy as well as pulmonary metastatic disease.  Plan is for continued immunotherapy until intolerable side effects or significant progression of disease.  Next imaging scheduled in December 2019. RTC as scheduled on 10/09/17 for continued Opdivo, labs and assessment.  History of cirrhosis/ascites: Requiring frequent paracentesis.  Last paracentesis was on 09/16/2017 where 2 L were removed.  Last seen by Dr. Vicente Males on 09/23/2017.  He was instructed to maintain a low-salt diet and avoid Tylenol.  Referred to [facility] for possible TIPS procedure  which may to reduce portal hypertension and potentially improve his kidney function.  Start low-dose Aldactone 25 mg today.  He was also referred to surgery to evaluate hydrocele that is causing significant discomfort.   Bilateral Hydrocele: Scheduled to see urology tomorrow.  Will get stat ultrasound of scrotum to rule out testicular torsion.  Ultrasound revealed no testicular abnormality or testicular torsion.  Did show large bilateral hydroceles and complex fluid-filled areas within the inguinal canals bilaterally likely from loculated ascites related to large volume abdominal ascites.  Having significant pain.  Will prescribe patient narcotic pain medication RX oxycodone 5 mg every 6 hours as needed for pain.  Also suggest scrotal sling for support.  We will give him oxycodone 5 mg in clinic today.  Patient states, oxycodone has worked well in the past.   Seen by Zara Council, NP on 09/26/2017 where he was evaluated for bilateral communicating hydroceles.  Explained to patient that communicating hydrocele has an open connection to the scrotum and abdominal area and due to his ascites from his liver failure the fluid will continue to collect in the scrotum as well.  He was found not to be a candidate for hydrocelectomy.  It was not recommended to drain fluid from hydrocele as a fluid would return next day.  She agreed that he is a good candidate for a TI PS procedure which may in turn help with his hydrocele.  She suggested he wear supportive underwear/athletic  supporter to help with the weight of the hydrocele and to take tension off the scrotal cord which is causing him pain.  She suggested elevation of the scrotum.  The patient was also instructed to call IMMEDIATELY (i.e., day or night) if any cardiopulmonary symptoms occur, especially chest pain, shortness of breath, dyspnea on exertion, paroxysmal nocturnal dyspnea, or orthopnea, and these were explained.  Greater than 50% was spent in  counseling and coordination of care with this patient including but not limited to discussion of the relevant topics above (See A&P) including, but not limited to diagnosis and management of acute and chronic medical conditions.   Faythe Casa, NP 10/14/2017 12:58 PM

## 2017-09-26 ENCOUNTER — Ambulatory Visit (INDEPENDENT_AMBULATORY_CARE_PROVIDER_SITE_OTHER): Payer: Medicare HMO | Admitting: Urology

## 2017-09-26 ENCOUNTER — Encounter: Payer: Self-pay | Admitting: Urology

## 2017-09-26 VITALS — BP 114/77 | HR 89 | Ht 71.0 in | Wt 160.8 lb

## 2017-09-26 DIAGNOSIS — N433 Hydrocele, unspecified: Secondary | ICD-10-CM

## 2017-09-26 LAB — THYROID PANEL WITH TSH
Free Thyroxine Index: 2.4 (ref 1.2–4.9)
T3 Uptake Ratio: 32 % (ref 24–39)
T4, Total: 7.5 ug/dL (ref 4.5–12.0)
TSH: 12.55 u[IU]/mL — ABNORMAL HIGH (ref 0.450–4.500)

## 2017-09-26 NOTE — Progress Notes (Signed)
09/26/2017 1:59 PM   Diamantina Providence 07/27/1955 [id number]  Referring provider: [name], [name], MD [place], [place] [address]  Chief Complaint  Patient presents with  . Hydrocele    HPI: Patient patient is 62 year old Caucasian male with a history of renal cell carcinoma with metastatic disease to the lungs, cirrhosis with ascites, CKD and DM who presents today for bilateral hydroceles.    He states that he has noticed that his scrotal swelling has been present for months, but just over the last 2 weeks is become painful.  Patient denies any gross hematuria, dysuria or suprapubic/flank pain.  Patient denies any fevers, chills, nausea or vomiting.   He states he has been having difficulty with urination, getting up at night to urinate x1, intermittency and hesitancy for the last 2 weeks.  Contrast CT on 09/20/2017 revealed a likely communicating hydrocele on the left.  Scrotal ultrasound on September 25, 2017 revealed No focal testicular abnormality or evidence of testicular torsion.  Large bilateral hydroceles.  Complex fluid-filled areas within the inguinal canals bilaterally,  likely loculated ascites related to the large volume abdominal ascites seen on prior CT      PMH: Past Medical History:  Diagnosis Date  . Anemia   . BPH (benign prostatic hyperplasia)   . CKD (chronic kidney disease) stage 3, GFR 30-59 ml/min (HCC)   . Clear cell renal cell carcinoma ([facility]) 2014   Left Nephrectomy.  . Colon polyps   . Diabetes mellitus without complication ([facility])    type 2   . Dyspnea    with exertion  . GERD (gastroesophageal reflux disease)   . History of gout   . History of nephrectomy    Left  . Hypertension   . Neuropathy   . Renal Cancer    Renal Cancer  . Renal cell carcinoma of left kidney (HCC)    mets to lungs  . Umbilical hernia     Surgical History: Past Surgical History:  Procedure Laterality Date  . BACK SURGERY     ruptured disc  1991   . ENDOBRONCHIAL ULTRASOUND N/A 10/14/2015   Procedure: ENDOBRONCHIAL ULTRASOUND;  Surgeon: [name], MD;  Location: [facility] ORS;  Service: Pulmonary;  Laterality: N/A;  . ESOPHAGOGASTRODUODENOSCOPY (EGD) WITH PROPOFOL N/A 04/15/2017   Procedure: ESOPHAGOGASTRODUODENOSCOPY (EGD) WITH PROPOFOL;  Surgeon: Jonathon Bellows, MD;  Location: [facility] END[facility]COPY;  Service: Gastroenterology;  Laterality: N/A;  Screen for esophageal varices  . EYE SURGERY Bilateral    Cataract Extraction with IOL  . KNEE SURGERY Right   . NEPHRECTOMY     left kidney 2014 renal cell cancer     Home Medications:  Allergies as of 09/26/2017   No Known Allergies     Medication List        Accurate as of 09/26/17  1:59 PM. Always use your most recent med list.          allopurinol 100 MG tablet Commonly known as:  ZYLOPRIM Take 1 tablet (100 mg total) by mouth daily.   ALPRAZolam 1 MG tablet Commonly known as:  XANAX Take 1 mg by mouth as needed for sleep.   aspirin EC 81 MG tablet Take 81 mg by mouth daily.   feeding supplement (ENSURE ENLIVE) Liqd Take 237 mLs by mouth 3 (three) times daily between meals.   gabapentin 100 MG capsule Commonly known as:  NEURONTIN Take 2 capsules (200 mg total) by mouth 3 (three) times daily.   insulin aspart  100 UNIT/ML FlexPen Commonly known as:  NOVOLOG Before meals 131-180 2 units, 181-240 4 units, 241-300 6 units, 301-350 8 units, 351-400 10 units, >400 12 units   Insulin Detemir 100 UNIT/ML Pen Commonly known as:  LEVEMIR Inject 40 Units into the skin daily at 10 pm.   Insulin Pen Needle 32G X 4 MM Misc 1 Device by Does not apply route 2 (two) times daily. E11.9   lovastatin 20 MG tablet Commonly known as:  MEVACOR Take 1 tablet (20 mg total) by mouth daily at 6 PM.   magic mouthwash w/lidocaine Soln Take 5 mLs by mouth 3 (three) times daily as needed for mouth pain. Swish and spit   ONE TOUCH ULTRA TEST test strip Generic drug:  glucose  blood Bid   oxyCODONE 5 MG immediate release tablet Commonly known as:  Oxy IR/ROXICODONE Take 1 tablet (5 mg total) by mouth every 6 (six) hours as needed for severe pain or breakthrough pain.   tamsulosin 0.4 MG Caps capsule Commonly known as:  FLOMAX Take 0.4 mg by mouth every other day.   valACYclovir 1000 MG tablet Commonly known as:  VALTREX Take 1 tablet (1,000 mg total) by mouth 2 (two) times daily. X 5-10 days with food       Allergies: No Known Allergies  Family History: Family History  Problem Relation Age of Onset  . Ovarian cancer Mother   . Diabetes Father   . Hypertension Father     Social History:  reports that he quit smoking about 24 years ago. His smoking use included cigarettes. He smoked 1.00 pack per day. He has never used smokeless tobacco. He reports that he drank alcohol. He reports that he does not use drugs.  ROS: UROLOGY Frequent Urination?: No Hard to postpone urination?: No Burning/pain with urination?: Yes Get up at night to urinate?: Yes Leakage of urine?: No Urine stream starts and stops?: Yes Trouble starting stream?: Yes Do you have to strain to urinate?: No Blood in urine?: No Urinary tract infection?: No Sexually transmitted disease?: No Injury to kidneys or bladder?: Yes Painful intercourse?: No Weak stream?: No Erection problems?: No Penile pain?: No  Gastrointestinal Nausea?: Yes Vomiting?: Yes Indigestion/heartburn?: Yes Diarrhea?: Yes Constipation?: Yes  Constitutional Fever: No Night sweats?: No Weight loss?: Yes Fatigue?: Yes  Skin Skin rash/lesions?: No Itching?: No  Eyes Blurred vision?: No Double vision?: No  Ears/Nose/Throat Sore throat?: Yes Sinus problems?: No  Hematologic/Lymphatic Swollen glands?: No Easy bruising?: No  Cardiovascular Leg swelling?: No Chest pain?: No  Respiratory Cough?: No Shortness of breath?: Yes  Endocrine Excessive thirst?: No  Musculoskeletal Back  pain?: No Joint pain?: No  Neurological Headaches?: No Dizziness?: No  Psychologic Depression?: Yes Anxiety?: Yes  Physical Exam: BP 114/77 (BP Location: Left Arm, Patient Position: Sitting, Cuff Size: Normal)   Pulse 89   Ht 5\' 11"  (1.803 m)   Wt 160 lb 12.8 oz (72.9 kg)   BMI 22.43 kg/m   Constitutional:  Well nourished. Alert and oriented, No acute distress. HEENT:  AT, moist mucus membranes.  Trachea midline, no masses. Cardiovascular: No clubbing, cyanosis, or edema. Respiratory: Normal respiratory effort, no increased work of breathing. GI: Abdomen is soft, non tender, non distended, no abdominal masses. Liver and spleen not palpable.  No hernias appreciated.  Stool sample for occult testing is not indicated.   GU: No CVA tenderness.  No bladder fullness or masses.  Patient with uncircumcised phallus.  Foreskin easily retracted   Urethral  meatus is patent.  No penile discharge. No penile lesions or rashes. Scrotum without lesions, cysts, rashes and/or edema.  Right testicle is located scrotally.  No masses are appreciated in the right testicle.  Right epididymis is normal.  Large left hydrocele is noted.   Rectal: Patient with  normal sphincter tone. Anus and perineum without scarring or rashes. No rectal masses are appreciated. Prostate is approximately 45 grams, no nodules are appreciated. Seminal vesicles are normal. Skin: No rashes, bruises or suspicious lesions. Lymph: No cervical or inguinal adenopathy. Neurologic: Grossly intact, no focal deficits, moving all 4 extremities. Psychiatric: Normal mood and affect.  Laboratory Data: Lab Results  Component Value Date   WBC 4.7 09/25/2017   HGB 10.6 (L) 09/25/2017   HCT 31.4 (L) 09/25/2017   MCV 84.5 09/25/2017   PLT 176 09/25/2017    Lab Results  Component Value Date   CREATININE 1.48 (H) 09/25/2017    Lab Results  Component Value Date   PSA <0.1 05/08/2017    No results found for: TESTOSTERONE  Lab  Results  Component Value Date   HGBA1C 8.9 (A) 09/23/2017    Lab Results  Component Value Date   TSH 12.550 (H) 09/25/2017       Component Value Date/Time   CHOL 100 05/08/2017 1419   HDL 34 (L) 05/08/2017 1419   CHOLHDL 2.9 05/08/2017 1419   LDLCALC 48 05/08/2017 1419    Lab Results  Component Value Date   AST 29 09/25/2017   Lab Results  Component Value Date   ALT 19 09/25/2017   No components found for: ALKALINEPHOPHATASE No components found for: BILIRUBINTOTAL  No results found for: ESTRADIOL  Urinalysis    Component Value Date/Time   COLORURINE AMBER (A) 01/25/2017 0831   APPEARANCEUR CLEAR (A) 01/25/2017 0831   APPEARANCEUR CLOUDY 04/26/2012 1620   LABSPEC 1.016 01/25/2017 0831   LABSPEC 1.017 04/26/2012 1620   PHURINE 5.0 01/25/2017 0831   GLUCOSEU >=500 (A) 01/25/2017 0831   GLUCOSEU see comment 04/26/2012 1620   HGBUR NEGATIVE 01/25/2017 0831   BILIRUBINUR NEGATIVE 01/25/2017 0831   BILIRUBINUR see comment 04/26/2012 1620   KETONESUR NEGATIVE 01/25/2017 0831   PROTEINUR NEGATIVE 01/25/2017 0831   NITRITE NEGATIVE 01/25/2017 0831   LEUKOCYTESUR NEGATIVE 01/25/2017 0831   LEUKOCYTESUR see comment 04/26/2012 1620    I have reviewed the labs.   Pertinent Imaging: CLINICAL DATA:  Bilateral lower abdominal pain radiating across the lower abdomen. Patient was released from hospital Wednesday after paracentesis. Patient has had umbilical hernia for years.  EXAM: CT ABDOMEN AND PELVIS WITH CONTRAST  TECHNIQUE: Multidetector CT imaging of the abdomen and pelvis was performed using the standard protocol following bolus administration of intravenous contrast.  CONTRAST:  165mL ISOVUE-300 IOPAMIDOL (ISOVUE-300) INJECTION 61%  COMPARISON:  09/13/2017 CT  FINDINGS: Lower chest: Normal heart size without pericardial effusion. Redemonstration of dominant pulmonary mass in the left lower lobe measuring approximately 2.7 x 2.4 cm allowing for  slice sampling differences in operator dependent imaging. Smaller scattered pulmonary nodules are also present within both lower lobes. Trace right greater than left pleural effusions.  Hepatobiliary: Cirrhotic appearance of the liver with heterogeneous somewhat wedge-shaped area of hypo density involving the medial segment of left hepatic lobe but without definable mass. Nondistended gallbladder with layering gallstones. No biliary dilatation.  Pancreas: Unremarkable  Spleen: Enlarged up to 16.5 cm without focal mass stable since recent.  Adrenals/Urinary Tract: Surgically absent left adrenal gland and kidney. The right adrenal gland  and right kidney are unremarkable.  Stomach/Bowel: The stomach is decompressed in appearance. The duodenal sweep and ligament of Treitz are normal in position. No small bowel dilatation or obstruction is identified. Mildly edematous appearance of the small bowel likely sympathetic from surrounding ascites. Likewise there is somewhat thickened appearance of the cecum and ascending colon since prior with submucosal fatty change. Possibility of a colitis is not entirely excluded. Centralized loops of small bowel secondary to large volume of ascites. No bowel obstruction.  Vascular/Lymphatic: Nonaneurysmal thoracic aorta with atherosclerosis. No adenopathy. Multiple upper abdominal varices.  Reproductive: Mildly enlarged prostate.  Other: Ventral hernia containing loculated fluid is again redemonstrated without definite herniation of bowel given lack of more proximal bowel this measures 10.6 x 9.6 x 2.7 cm (transverse by CC by AP dimension) with the mouth of hernia measuring 4.2 x 1.7 cm in transverse by craniocaudad. Previously this hernia was estimated at 9.5 cm transverse by 2.7 cm in thickness. Prominent enhancing omental vessels project into the hernia as before. Moderate to large volume of ascites redemonstrated.  Musculoskeletal:  Degenerative disc disease L3-4 and L4-5.  IMPRESSION: 1. Moderate to large volume of ascites despite recent paracentesis suggests reaccumulation of fluid given similar appearance of the abdomen and pelvis 09/13/2017 (patient had a paracentesis performed on 09/16/2017). 2. Cirrhotic liver with splenomegaly and stigmata of portal hypertension. 3. Slightly wedge shaped perfusion anomaly of the medial segment of left hepatic lobe similar to prior. A mass cannot be entirely excluded. 4. Stable pulmonary nodules with dominant mass in left lower lobe. 5. Large multiloculated fluid containing ventral hernia with herniation of omental vessels as before currently estimated at 10.6 x 9.6 x 2.7 cm. This appears more prominent than on prior.   Electronically Signed   By: Ashley Royalty M.D.   On: 09/20/2017 21:01  CLINICAL DATA:  Scrotal swelling  EXAM: SCROTAL ULTRASOUND  DOPPLER ULTRASOUND OF THE TESTICLES  TECHNIQUE: Complete ultrasound examination of the testicles, epididymis, and other scrotal structures was performed. Color and spectral Doppler ultrasound were also utilized to evaluate blood flow to the testicles.  COMPARISON:  None.  FINDINGS: Right testicle  Measurements: 4.1 x 2.6 x 2.7 cm. No mass or microlithiasis visualized.  Left testicle  Measurements: 4.1 x 2.7 x 2.8 cm. No mass or microlithiasis visualized.  Right epididymis:  Normal in size and appearance.  Left epididymis:  1.1 cm epididymal head cyst or spermatocele.  Hydrocele:  Large bilateral hydroceles  Varicocele:  None visualized.  Pulsed Doppler interrogation of both testes demonstrates normal low resistance arterial and venous waveforms bilaterally.  Other: Complex fluid collections are noted in the inguinal canal regions bilaterally with numerous internal septations. These may be related to hernias containing loculated fluid.  IMPRESSION: No focal testicular abnormality or  evidence of testicular torsion.  Large bilateral hydroceles.  Complex fluid-filled areas within the inguinal canals bilaterally, likely loculated ascites related to the large volume abdominal ascites seen on prior CT.   Electronically Signed   By: Rolm Baptise M.D.   On: 09/25/2017 13:2 I have independently reviewed the films with patient and Dr. Bernardo Heater  Assessment & Plan:    1. Bilateral hydroceles Left >>> right I explained to the patient that the hydrocele is likely what is called a communicating hydrocele.  I explained that a communicating hydrocele has an open connection to the scrotum and the abdominal area and due to his ascites from his liver failure, the fluid is being collected in the left scrotum  as well.  Due to his comorbid conditions, he is not a candidate for hydrocelectomy.  I also explained that draining the fluid from the hydrocele would not be effective as it is communicating and the fluid returned the next day.  There is also a risk of introducing infection when draining hydroceles and again and his comorbid condition this would be very risky. At this time he is referred to see if he is candidate for TI PS and this may help with his hydrocele if he undergoes this procedure.  In the meantime, I have suggested that he wear supportive underwear/athletic supporter to help assist with the bearing of the weight of the hydrocele to take tension off his scrotal cord with the pain is likely coming from.  I also advised him to put towels underneath his scrotum to elevated above the thigh again to take the tension off the hydrocele and possibly reduce some of the fluid contained within the scrotal sac  Return if symptoms worsen or fail to improve.  These notes generated with voice recognition software. I apologize for typographical errors.  Zara Council, PA-C  [facility] [address]  [place], [place] [address] [phone]

## 2017-09-27 NOTE — Telephone Encounter (Signed)
Pt calling in.  States that pharmacy is telling him this medication needs approval.  Pt has been completely out of this medication for the past three days. Pt can be reached at [phone].

## 2017-09-27 NOTE — Telephone Encounter (Signed)
I think patient is needing a prior authorization for this medication

## 2017-09-30 ENCOUNTER — Telehealth: Payer: Self-pay | Admitting: Urology

## 2017-09-30 ENCOUNTER — Telehealth: Payer: Self-pay | Admitting: Gastroenterology

## 2017-09-30 ENCOUNTER — Other Ambulatory Visit: Payer: Self-pay

## 2017-09-30 ENCOUNTER — Ambulatory Visit: Payer: Medicare HMO | Admitting: Internal Medicine

## 2017-09-30 ENCOUNTER — Telehealth: Payer: Self-pay | Admitting: Internal Medicine

## 2017-09-30 ENCOUNTER — Telehealth: Payer: Self-pay

## 2017-09-30 DIAGNOSIS — E1142 Type 2 diabetes mellitus with diabetic polyneuropathy: Secondary | ICD-10-CM

## 2017-09-30 DIAGNOSIS — Z794 Long term (current) use of insulin: Secondary | ICD-10-CM

## 2017-09-30 MED ORDER — INSULIN ASPART 100 UNIT/ML FLEXPEN: PEN_INJECTOR | SUBCUTANEOUS | 11 refills | Status: AC

## 2017-09-30 MED ORDER — INSULIN ASPART 100 UNIT/ML FLEXPEN: PEN_INJECTOR | SUBCUTANEOUS | 0 refills | Status: DC

## 2017-09-30 NOTE — Telephone Encounter (Signed)
Copied from [place] [id number]. Topic: Quick Communication - Rx Refill/Question >> Sep 30, 2017 11:25 AM Scherrie Gerlach wrote: Medication: insulin aspart (NOVOLOG) 100 UNIT/ML FlexPen  Pt states [facility] does not have this Rx, and pt has been without since 9/24 (when this was to be sent) Please send asap.  Dr Aundra Dubin has never written this Rx  [facility] [address] [place] [phone] (Phone) [phone] (Fax)

## 2017-09-30 NOTE — Telephone Encounter (Signed)
Agent reports pt. Had called in regard to making an appointment for scrotal swelling. Urology had reported to agent that pt. Should PCP if needed. Call dropped before agent could make appointment. Nurse called to inquire about symptoms. Message left to call back.

## 2017-09-30 NOTE — Telephone Encounter (Signed)
Pt. Called back and states he is going to call Urology back. States "Dr. Aundra Dubin can't do anything for my scrotum swelling."

## 2017-09-30 NOTE — Addendum Note (Signed)
Addended by: Orland Mustard on: 09/30/2017 01:57 PM   Modules accepted: Orders

## 2017-09-30 NOTE — Telephone Encounter (Signed)
Spoke with Rodney Stewart regarding his scrotal swelling.  Reviewed the discussion I had with Dr. Bernardo Heater regarding the fact that his hydrocele is likely communicating with his abdominal ascites.  Because of this, we would not be able to aspirate the hydrocele as the fluid or return as soon as the next day and it could possibly introduce infection in the scrotal area.  He is also not a surgical candidate for a hydrocelectomy due to the abdominal ascites and his other medical issues.  It is likely even if he underwent a hydrocelectomy, he would have recurrence.    I encouraged him to call palliative care back and have them come out to his house as they had an appointment with him this afternoon which he canceled.    I also stated that I would speak with Dr. Diamantina Providence and/or Dr. Erlene Quan tomorrow, but it is likely they will have the same opinion.

## 2017-09-30 NOTE — Telephone Encounter (Signed)
Pt left vm for Dr. Vicente Males he needs to discuss an issue please call

## 2017-09-30 NOTE — Telephone Encounter (Signed)
Please advise. Patient has appointment on 10-03-17

## 2017-10-01 ENCOUNTER — Telehealth: Payer: Self-pay | Admitting: Urology

## 2017-10-01 NOTE — Telephone Encounter (Signed)
Please let Mr. Wilz know that I have spoken with one of our other surgeons, Dr. Diamantina Providence, and he is in agreement with Dr. Bernardo Heater that the hydrocele is due to the abdominal ascites.    This means that unless the ascites is corrected, the hydrocele will not go away.  This also means we cannot drain the hydrocele or perform surgery.  Has he had his palliative care visit?  What were their recommendations.

## 2017-10-01 NOTE — Telephone Encounter (Signed)
Patient notified and will call Grayland Ormond to get in sooner if possible

## 2017-10-02 ENCOUNTER — Encounter: Payer: Self-pay | Admitting: *Deleted

## 2017-10-02 ENCOUNTER — Ambulatory Visit: Payer: Medicare HMO | Admitting: Surgery

## 2017-10-03 ENCOUNTER — Encounter: Payer: Self-pay | Admitting: Internal Medicine

## 2017-10-03 ENCOUNTER — Ambulatory Visit (INDEPENDENT_AMBULATORY_CARE_PROVIDER_SITE_OTHER): Payer: Medicare HMO | Admitting: Internal Medicine

## 2017-10-03 VITALS — BP 112/66 | HR 91 | Temp 98.6°F | Ht 71.0 in | Wt 168.2 lb

## 2017-10-03 DIAGNOSIS — R188 Other ascites: Secondary | ICD-10-CM | POA: Diagnosis not present

## 2017-10-03 DIAGNOSIS — N433 Hydrocele, unspecified: Secondary | ICD-10-CM | POA: Diagnosis not present

## 2017-10-03 DIAGNOSIS — R600 Localized edema: Secondary | ICD-10-CM

## 2017-10-03 DIAGNOSIS — C649 Malignant neoplasm of unspecified kidney, except renal pelvis: Secondary | ICD-10-CM | POA: Diagnosis not present

## 2017-10-03 DIAGNOSIS — E039 Hypothyroidism, unspecified: Secondary | ICD-10-CM

## 2017-10-03 DIAGNOSIS — R69 Illness, unspecified: Secondary | ICD-10-CM | POA: Diagnosis not present

## 2017-10-03 DIAGNOSIS — K7011 Alcoholic hepatitis with ascites: Secondary | ICD-10-CM

## 2017-10-03 DIAGNOSIS — Z515 Encounter for palliative care: Secondary | ICD-10-CM | POA: Diagnosis not present

## 2017-10-03 DIAGNOSIS — K7460 Unspecified cirrhosis of liver: Secondary | ICD-10-CM

## 2017-10-03 MED ORDER — FUROSEMIDE 20 MG PO TABS: 20.0000 mg | ORAL_TABLET | Freq: Every day | ORAL | 0 refills | Status: DC

## 2017-10-03 MED ORDER — LEVOTHYROXINE SODIUM 25 MCG PO TABS: 25.0000 ug | ORAL_TABLET | Freq: Every day | ORAL | 1 refills | Status: DC

## 2017-10-03 NOTE — Progress Notes (Signed)
Pre visit review using our clinic review tool, if applicable. No additional management support is needed unless otherwise documented below in the visit note. 

## 2017-10-03 NOTE — Patient Instructions (Addendum)
Take lasix 20 mg in am x 3 days for leg swelling  Start thyroid medication in am 30 minutes before food  [facility] urology [phone] [address] [place] [address]   Transjugular Intrahepatic Portosystemic Shunt Insertion Transjugular intrahepatic portosystemic shunt (TIPS) insertion is a procedure to restore blood flow through the liver. This procedure may be done when a blockage has caused problems with the normal flow of blood through the liver, resulting in increased pressure (hypertension) in the vein that carries blood to the liver (portal vein). Portal hypertension is usually caused by scar tissue from liver disease, such as cirrhosis. It can cause serious problems. A TIPS procedure may be done if portal hypertension or liver disease has caused problems such as:  Bleeding from the esophagus or stomach.  Buildup of fluid in the abdomen.  In a TIPS procedure, a new pathway for blood flow is created through the liver. A small wire-mesh tube called a shunt or stent is placed inside the pathway to keep it open. The shunt is passed down the jugular vein in your neck. Then, the shunt is inserted between the portal vein and a vein on the other side of the liver (hepatic vein), which is a vein that blood flows through when it leaves the liver. The shunt connects these blood vessels and improves blood flow across the liver. Tell a health care provider about:  Any allergies you have.  All medicines you are taking, including vitamins, herbs, eye drops, creams, and over-the-counter medicines.  Any problems you or family members have had with anesthetic medicines.  Any blood disorders you have.  Any surgeries you have had.  Any medical conditions you have.  Whether you are pregnant or may be pregnant. What are the risks? Generally, this is a safe procedure. However, problems may occur, including:  Allergic reactions to medicines or dyes.  Bleeding into the abdomen.  Irregular  heartbeat (cardiac arrhythmia).  Decline in brain function (encephalopathy) in people with severe liver disease. This may be a problem after the TIPS has been inserted, and it may require treatment. People who have had encephalopathy may not be good candidates for TIPS.  Blockage of the shunt. This may lead to a return of the problems. If this occurs, a new shunt can be placed or the existing shunt can be adjusted.You will have ultrasound exams to evaluate for a blockage of the shunt.  What happens before the procedure? Staying hydrated Follow instructions from your health care provider about hydration, which may include:  Up to 2 hours before the procedure - you may continue to drink clear liquids, such as water, clear fruit juice, black coffee, and plain tea.  Eating and drinking restrictions Follow instructions from your health care provider about eating and drinking, which may include:  8 hours before the procedure - stop eating heavy meals or foods such as meat, fried foods, or fatty foods.  6 hours before the procedure - stop eating light meals or foods, such as toast or cereal.  6 hours before the procedure - stop drinking milk or drinks that contain milk.  2 hours before the procedure - stop drinking clear liquids.  Medicines Ask your health care provider about:  Changing or stopping your regular medicines. This is especially important if you are taking diabetes medicines or blood thinners.  Taking medicines such as aspirin and ibuprofen. These medicines can thin your blood. Do not take these medicines before your procedure if your health care provider  instructs you not to.  General instructions  You may have blood tests to see how well your kidneys and liver are working and to see how well your blood can clot.  Plan to have someone take you home from the hospital or clinic. What happens during the procedure?  To reduce your risk of infection: ? Your health care team  will wash or sanitize their hands. ? Your skin will be washed with soap.  An IV tube will be inserted into one of your veins.  You will be given one or more of the following: ? A medicine to help you relax (sedative). ? A medicine to numb the neck area (local anesthetic). ? A medicine to make you fall asleep (general anesthetic).  A needle will be used to make a small hole in a vein in the right side of your neck. Needles and long, thin, flexible tubes (catheters) will be moved through the hole down to the veins in your liver.  Dye will be injected through a catheter, and X-ray images will be taken. This will help your surgeon see the blood flow around your liver. As the dye passes through the blood vessels in your body, you may experience a warm feeling.  A needle will be inserted across the liver to make a connection with a branch of the portal vein. This channel will be expanded, and the shunt will be inserted and left in place.  The needle and catheter will be removed.  Pressure will be applied to your neck to prevent bleeding. A bandage (dressing) will be applied. The procedure may vary among health care providers and hospitals. What happens after the procedure?  Your blood pressure, heart rate, breathing rate, and blood oxygen level will be monitored until the medicines you were given have worn off.  Your health care provider will watch for signs of complications.  You will be instructed to keep your head raised (elevated) above the level of your heart for a few hours after the procedure. This will help to prevent bleeding from the insertion site in your neck.  You may feel mild stiffness in your neck where the needle was inserted.  An ultrasound may be done the morning after the procedure to make sure that the shunt is open and working well.  Do not drive for 24 hours if you were given a sedative. This information is not intended to replace advice given to you by your health  care provider. Make sure you discuss any questions you have with your health care provider. Document Released: 09/16/2002 Document Revised: 09/30/2015 Document Reviewed: 09/30/2015 Elsevier Interactive Patient Education  2018 [place].  Transjugular Intrahepatic Portosystemic Shunt Insertion, Care After This sheet gives you information about how to care for yourself after your procedure. Your health care provider may also give you more specific instructions. If you have problems or questions, contact your health care provider. What can I expect after the procedure? After the procedure, it is common to have:  Less abdominal discomfort after the placement of the stent.  Mild stiffness in your neck.  Follow these instructions at home:  Do not drive for 24 hours if you were given a medicine to help you relax (sedative) during your procedure.  You may remove the bandage (dressing) from your neck as told by your health care provider.  Follow any instructions from your health care provider about eating or drinking.  Take over-the-counter and prescription medicines only as told by your health care  provider.  Keep all follow-up visits as told by your health care provider. This is important. Contact a health care provider if:  You develop a rash.  You have bleeding from the place in your neck where the needle was inserted. Get help right away if:  You have shortness of breath.  You feel faint or you pass out.  You have chest pain.  You have difficulty breathing.  You have a cough or you cough up blood.  You have weakness.  You have difficulty moving your arms or legs.  You develop balance problems.  You have problems with speech or vision.  You become confused. This information is not intended to replace advice given to you by your health care provider. Make sure you discuss any questions you have with your health care provider. Document Released: 10/08/2012 Document  Revised: 09/30/2015 Document Reviewed: 09/30/2015 Elsevier Interactive Patient Education  [employer].

## 2017-10-03 NOTE — Progress Notes (Addendum)
Chief Complaint  Patient presents with  . Follow-up   F/u  1. Liver cirrhosis with ascites no c/w SBP currently just had 2 L via IR removed paracentesis appt tips 10/15/17 in [place]. Complications of hyponatremia, ascites, leg edema spironolactone stopped 2/2 hyperK  2. L>R hydrocele and testicular pain urology in [place] thinks related to #1 and no intervention offered will refer to [facility] urology in [place] as he is still in discomfort with enlarged scrotum  3. Leg edema worse x 2-3 day s 4. TSH >12 hypothyroidism   Review of Systems  Constitutional: Negative for weight loss.  HENT: Negative for hearing loss.   Eyes: Negative for blurred vision.  Respiratory: Negative for shortness of breath.   Cardiovascular: Positive for leg swelling. Negative for chest pain.  Gastrointestinal: Negative for abdominal pain.  Musculoskeletal: Negative for falls.  Skin: Negative for rash.  Neurological: Negative for headaches.  Psychiatric/Behavioral: Negative for depression.   Past Medical History:  Diagnosis Date  . Anemia   . BPH (benign prostatic hyperplasia)   . CKD (chronic kidney disease) stage 3, GFR 30-59 ml/min (HCC)   . Clear cell renal cell carcinoma ([place]) 2014   Left Nephrectomy.  . Colon polyps   . Diabetes mellitus without complication ([place])    type 2   . Dyspnea    with exertion  . GERD (gastroesophageal reflux disease)   . History of gout   . History of nephrectomy    Left  . Hypertension   . Neuropathy   . Renal Cancer    Renal Cancer  . Renal cell carcinoma of left kidney (HCC)    mets to lungs  . Umbilical hernia    Past Surgical History:  Procedure Laterality Date  . BACK SURGERY     ruptured disc 1991   . ENDOBRONCHIAL ULTRASOUND N/A 10/14/2015   Procedure: ENDOBRONCHIAL ULTRASOUND;  Surgeon: [name], MD;  Location: [facility] ORS;  Service: Pulmonary;  Laterality: N/A;  . ESOPHAGOGASTRODUODENOSCOPY (EGD) WITH PROPOFOL N/A 04/15/2017   Procedure:  ESOPHAGOGASTRODUODENOSCOPY (EGD) WITH PROPOFOL;  Surgeon: Jonathon Bellows, MD;  Location: [facility];  Service: Gastroenterology;  Laterality: N/A;  Screen for esophageal varices  . EYE SURGERY Bilateral    Cataract Extraction with IOL  . KNEE SURGERY Right   . NEPHRECTOMY     left kidney 2014 renal cell cancer    Family History  Problem Relation Age of Onset  . Ovarian cancer Mother   . Diabetes Father   . Hypertension Father    Social History   Socioeconomic History  . Marital status: Divorced    Spouse name: Not on file  . Number of children: 2  . Years of education: Not on file  . Highest education level: Not on file  Occupational History  . Not on file  Social Needs  . Financial resource strain: Not hard at all  . Food insecurity:    Worry: Never true    Inability: Never true  . Transportation needs:    Medical: No    Non-medical: No  Tobacco Use  . Smoking status: Former Smoker    Packs/day: 1.00    Types: Cigarettes    Last attempt to quit: 04/15/1993    Years since quitting: 24.4  . Smokeless tobacco: Never Used  Substance and Sexual Activity  . Alcohol use: Not Currently  . Drug use: No  . Sexual activity: Yes  Lifestyle  . Physical activity:    Days per week: Patient refused  Minutes per session: Patient refused  . Stress: Patient refused  Relationships  . Social connections:    Talks on phone: Patient refused    Gets together: Patient refused    Attends religious service: Patient refused    Active member of club or organization: Patient refused    Attends meetings of clubs or organizations: Patient refused    Relationship status: Patient refused  . Intimate partner violence:    Fear of current or ex partner: Patient refused    Emotionally abused: Patient refused    Physically abused: Patient refused    Forced sexual activity: Patient refused  Other Topics Concern  . Not on file  Social History Narrative   HS graduate    Retired    Divorced     2 daughters    Lives alone    [name] to travel to [employer]    1 living brother who is doctor in [place], wears seat belt    No outpatient medications have been marked as taking for the 10/03/17 encounter (Office Visit) with [name], [name], MD.   Current Facility-Administered Medications for the 10/03/17 encounter (Office Visit) with [name], [name], MD  Medication  . albumin human 25 % solution 12.5 g  . albumin human 25 % solution 12.5 g  . albumin human 5 % solution 25 g   No Known Allergies Recent Results (from the past 2160 hour(s))  CBC with Differential/Platelet     Status: Abnormal   Collection Time: 07/10/17  9:15 AM  Result Value Ref Range   WBC 3.3 (L) 3.8 - 10.6 K/uL   RBC 4.00 (L) 4.40 - 5.90 MIL/uL   Hemoglobin 11.1 (L) 13.0 - 18.0 g/dL   HCT 32.6 (L) 40.0 - 52.0 %   MCV 81.6 80.0 - 100.0 fL   MCH 27.7 26.0 - 34.0 pg   MCHC 34.0 32.0 - 36.0 g/dL   RDW 15.5 (H) 11.5 - 14.5 %   Platelets 139 (L) 150 - 440 K/uL   Neutrophils Relative % 57 %   Neutro Abs 1.9 1.4 - 6.5 K/uL   Lymphocytes Relative 19 %   Lymphs Abs 0.6 (L) 1.0 - 3.6 K/uL   Monocytes Relative 12 %   Monocytes Absolute 0.4 0.2 - 1.0 K/uL   Eosinophils Relative 11 %   Eosinophils Absolute 0.4 0 - 0.7 K/uL   Basophils Relative 1 %   Basophils Absolute 0.0 0 - 0.1 K/uL    Comment: Performed at [facility], [address] [place], [place] [address]  Comprehensive metabolic panel     Status: Abnormal   Collection Time: 07/10/17  9:15 AM  Result Value Ref Range   Sodium 128 (L) 135 - 145 mmol/L   Potassium 3.9 3.5 - 5.1 mmol/L   Chloride 86 (L) 98 - 111 mmol/L    Comment: Please note change in reference range.   CO2 28 22 - 32 mmol/L   Glucose, Bld 188 (H) 70 - 99 mg/dL    Comment: Please note change in reference range.   BUN 23 8 - 23 mg/dL    Comment: Please note change in reference range.   Creatinine, Ser 1.51 (H) 0.61 - 1.24 mg/dL   Calcium 9.7 8.9 -  10.3 mg/dL   Total Protein 7.7 6.5 - 8.1 g/dL   Albumin 3.2 (L) 3.5 - 5.0 g/dL   AST 43 (H) 15 - 41 U/L   ALT 21 0 -  44 U/L    Comment: Please note change in reference range.   Alkaline Phosphatase 502 (H) 38 - 126 U/L   Total Bilirubin 2.0 (H) 0.3 - 1.2 mg/dL   GFR calc non Af Amer 48 (L) >60 mL/min   GFR calc Af Amer 56 (L) >60 mL/min    Comment: (NOTE) The eGFR has been calculated using the CKD EPI equation. This calculation has not been validated in all clinical situations. eGFR's persistently <60 mL/min signify possible Chronic Kidney Disease.    Anion gap 14 5 - 15    Comment: Performed at [facility], [address] [place], [place] [address]  CBC with Differential/Platelet     Status: Abnormal   Collection Time: 07/24/17 11:04 AM  Result Value Ref Range   WBC 3.7 (L) 3.8 - 10.6 K/uL   RBC 4.30 (L) 4.40 - 5.90 MIL/uL   Hemoglobin 11.5 (L) 13.0 - 18.0 g/dL   HCT 35.0 (L) 40.0 - 52.0 %   MCV 81.3 80.0 - 100.0 fL   MCH 26.8 26.0 - 34.0 pg   MCHC 33.0 32.0 - 36.0 g/dL   RDW 15.3 (H) 11.5 - 14.5 %   Platelets 140 (L) 150 - 440 K/uL   Neutrophils Relative % 58 %   Neutro Abs 2.2 1.4 - 6.5 K/uL   Lymphocytes Relative 18 %   Lymphs Abs 0.7 (L) 1.0 - 3.6 K/uL   Monocytes Relative 13 %   Monocytes Absolute 0.5 0.2 - 1.0 K/uL   Eosinophils Relative 10 %   Eosinophils Absolute 0.4 0 - 0.7 K/uL   Basophils Relative 1 %   Basophils Absolute 0.0 0 - 0.1 K/uL    Comment: Performed at [facility], [address] [place], [place] [address]  Comprehensive metabolic panel     Status: Abnormal   Collection Time: 07/24/17 11:04 AM  Result Value Ref Range   Sodium 127 (L) 135 - 145 mmol/L   Potassium 4.3 3.5 - 5.1 mmol/L   Chloride 86 (L) 98 - 111 mmol/L   CO2 27 22 - 32 mmol/L   Glucose, Bld 158 (H) 70 - 99 mg/dL   BUN 27 (H) 8 - 23 mg/dL   Creatinine, Ser 1.37 (H) 0.61 - 1.24 mg/dL   Calcium 9.4 8.9 - 10.3 mg/dL   Total Protein 7.9 6.5 - 8.1 g/dL   Albumin 3.3  (L) 3.5 - 5.0 g/dL   AST 44 (H) 15 - 41 U/L   ALT 22 0 - 44 U/L   Alkaline Phosphatase 522 (H) 38 - 126 U/L   Total Bilirubin 1.8 (H) 0.3 - 1.2 mg/dL   GFR calc non Af Amer 54 (L) >60 mL/min   GFR calc Af Amer >60 >60 mL/min    Comment: (NOTE) The eGFR has been calculated using the CKD EPI equation. This calculation has not been validated in all clinical situations. eGFR's persistently <60 mL/min signify possible Chronic Kidney Disease.    Anion gap 14 5 - 15    Comment: Performed at [facility], [address] [place],  [address]  CBC with Differential/Platelet     Status: Abnormal   Collection Time: 08/07/17  9:07 AM  Result Value Ref Range   WBC 3.9 3.8 - 10.6 K/uL   RBC 4.33 (L) 4.40 - 5.90 MIL/uL   Hemoglobin 11.8 (L) 13.0 - 18.0 g/dL   HCT 35.4 (L) 40.0 - 52.0 %   MCV 81.9 80.0 - 100.0 fL  MCH 27.4 26.0 - 34.0 pg   MCHC 33.4 32.0 - 36.0 g/dL   RDW 15.8 (H) 11.5 - 14.5 %   Platelets 153 150 - 440 K/uL   Neutrophils Relative % 61 %   Neutro Abs 2.4 1.4 - 6.5 K/uL   Lymphocytes Relative 17 %   Lymphs Abs 0.7 (L) 1.0 - 3.6 K/uL   Monocytes Relative 14 %   Monocytes Absolute 0.5 0.2 - 1.0 K/uL   Eosinophils Relative 7 %   Eosinophils Absolute 0.3 0 - 0.7 K/uL   Basophils Relative 1 %   Basophils Absolute 0.0 0 - 0.1 K/uL    Comment: Performed at [facility], [address] [place], [place] [address]  Comprehensive metabolic panel     Status: Abnormal   Collection Time: 08/07/17  9:07 AM  Result Value Ref Range   Sodium 125 (L) 135 - 145 mmol/L   Potassium 4.5 3.5 - 5.1 mmol/L   Chloride 87 (L) 98 - 111 mmol/L   CO2 24 22 - 32 mmol/L   Glucose, Bld 147 (H) 70 - 99 mg/dL   BUN 27 (H) 8 - 23 mg/dL   Creatinine, Ser 1.63 (H) 0.61 - 1.24 mg/dL   Calcium 9.1 8.9 - 10.3 mg/dL   Total Protein 7.9 6.5 - 8.1 g/dL   Albumin 3.2 (L) 3.5 - 5.0 g/dL   AST 59 (H) 15 - 41 U/L   ALT 24 0 - 44 U/L   Alkaline Phosphatase 491 (H) 38 - 126 U/L   Total  Bilirubin 2.1 (H) 0.3 - 1.2 mg/dL   GFR calc non Af Amer 44 (L) >60 mL/min   GFR calc Af Amer 51 (L) >60 mL/min    Comment: (NOTE) The eGFR has been calculated using the CKD EPI equation. This calculation has not been validated in all clinical situations. eGFR's persistently <60 mL/min signify possible Chronic Kidney Disease.    Anion gap 14 5 - 15    Comment: Performed at [facility], [address] [place], [place] [address]  Thyroid Panel With TSH     Status: Abnormal   Collection Time: 08/21/17  1:06 PM  Result Value Ref Range   TSH 8.370 (H) 0.450 - 4.500 uIU/mL   T4, Total 6.4 4.5 - 12.0 ug/dL   T3 Uptake Ratio 30 24 - 39 %   Free Thyroxine Index 1.9 1.2 - 4.9    Comment: (NOTE) Performed At: [facility] [place], [place] [id number] Rush Farmer MD [id number]   CBC with Differential/Platelet     Status: Abnormal   Collection Time: 08/21/17  1:06 PM  Result Value Ref Range   WBC 3.1 (L) 3.8 - 10.6 K/uL   RBC 4.16 (L) 4.40 - 5.90 MIL/uL   Hemoglobin 11.2 (L) 13.0 - 18.0 g/dL   HCT 33.6 (L) 40.0 - 52.0 %   MCV 80.9 80.0 - 100.0 fL   MCH 27.1 26.0 - 34.0 pg   MCHC 33.4 32.0 - 36.0 g/dL   RDW 15.5 (H) 11.5 - 14.5 %   Platelets 126 (L) 150 - 440 K/uL   Neutrophils Relative % 69 %   Neutro Abs 2.1 1.4 - 6.5 K/uL   Lymphocytes Relative 14 %   Lymphs Abs 0.4 (L) 1.0 - 3.6 K/uL   Monocytes Relative 11 %   Monocytes Absolute 0.4 0.2 - 1.0 K/uL   Eosinophils Relative 5 %   Eosinophils Absolute 0.1 0 - 0.7 K/uL  Basophils Relative 1 %   Basophils Absolute 0.0 0 - 0.1 K/uL    Comment: Performed at [facility], [address] [place], [place] [address]  Comprehensive metabolic panel     Status: Abnormal   Collection Time: 08/21/17  1:06 PM  Result Value Ref Range   Sodium 124 (L) 135 - 145 mmol/L   Potassium 5.5 (H) 3.5 - 5.1 mmol/L   Chloride 88 (L) 98 - 111 mmol/L   CO2 27 22 - 32 mmol/L   Glucose, Bld 256 (H) 70 - 99 mg/dL    BUN 23 8 - 23 mg/dL   Creatinine, Ser 1.38 (H) 0.61 - 1.24 mg/dL   Calcium 8.5 (L) 8.9 - 10.3 mg/dL   Total Protein 7.1 6.5 - 8.1 g/dL   Albumin 3.1 (L) 3.5 - 5.0 g/dL   AST 55 (H) 15 - 41 U/L   ALT 27 0 - 44 U/L   Alkaline Phosphatase 516 (H) 38 - 126 U/L   Total Bilirubin 2.0 (H) 0.3 - 1.2 mg/dL   GFR calc non Af Amer 54 (L) >60 mL/min   GFR calc Af Amer >60 >60 mL/min    Comment: (NOTE) The eGFR has been calculated using the CKD EPI equation. This calculation has not been validated in all clinical situations. eGFR's persistently <60 mL/min signify possible Chronic Kidney Disease.    Anion gap 9 5 - 15    Comment: Performed at [facility], [address] [place], [place] [address]  CBC with Differential/Platelet     Status: Abnormal   Collection Time: 09/11/17 10:31 AM  Result Value Ref Range   WBC 3.9 3.8 - 10.6 K/uL   RBC 4.29 (L) 4.40 - 5.90 MIL/uL   Hemoglobin 11.9 (L) 13.0 - 18.0 g/dL   HCT 35.2 (L) 40.0 - 52.0 %   MCV 82.0 80.0 - 100.0 fL   MCH 27.7 26.0 - 34.0 pg   MCHC 33.7 32.0 - 36.0 g/dL   RDW 15.0 (H) 11.5 - 14.5 %   Platelets 159 150 - 440 K/uL   Neutrophils Relative % 61 %   Neutro Abs 2.4 1.4 - 6.5 K/uL   Lymphocytes Relative 19 %   Lymphs Abs 0.7 (L) 1.0 - 3.6 K/uL   Monocytes Relative 13 %   Monocytes Absolute 0.5 0.2 - 1.0 K/uL   Eosinophils Relative 6 %   Eosinophils Absolute 0.2 0 - 0.7 K/uL   Basophils Relative 1 %   Basophils Absolute 0.0 0 - 0.1 K/uL    Comment: Performed at [facility], [address] [place], [place] [address]  Comprehensive metabolic panel     Status: Abnormal   Collection Time: 09/11/17 10:31 AM  Result Value Ref Range   Sodium 119 (LL) 135 - 145 mmol/L    Comment: RESULTS VERIFIED BY REPEAT TESTING CRITICAL RESULT CALLED TO, READ BACK BY AND VERIFIED WITH [facility] [place] AT 10:58 09/11/2017 KMR    Potassium 5.2 (H) 3.5 - 5.1 mmol/L   Chloride 85 (L) 98 - 111 mmol/L   CO2 25 22 - 32 mmol/L   Glucose,  Bld 69 (L) 70 - 99 mg/dL   BUN 19 8 - 23 mg/dL   Creatinine, Ser 1.20 0.61 - 1.24 mg/dL   Calcium 9.0 8.9 - 10.3 mg/dL   Total Protein 7.2 6.5 - 8.1 g/dL   Albumin 3.3 (L) 3.5 - 5.0 g/dL   AST 51 (H) 15 - 41 U/L   ALT 23  0 - 44 U/L   Alkaline Phosphatase 417 (H) 38 - 126 U/L   Total Bilirubin 1.9 (H) 0.3 - 1.2 mg/dL   GFR calc non Af Amer >60 >60 mL/min   GFR calc Af Amer >60 >60 mL/min    Comment: (NOTE) The eGFR has been calculated using the CKD EPI equation. This calculation has not been validated in all clinical situations. eGFR's persistently <60 mL/min signify possible Chronic Kidney Disease.    Anion gap 9 5 - 15    Comment: Performed at [facility], [address] [place], [place] [address]  Thyroid Panel With TSH     Status: Abnormal   Collection Time: 09/11/17 10:31 AM  Result Value Ref Range   TSH 9.560 (H) 0.450 - 4.500 uIU/mL   T4, Total 8.8 4.5 - 12.0 ug/dL   T3 Uptake Ratio 31 24 - 39 %   Free Thyroxine Index 2.7 1.2 - 4.9    Comment: (NOTE) Performed At: [facility] [place], [place] [id number] Rush Farmer MD [id number]   Basic metabolic panel     Status: Abnormal   Collection Time: 09/13/17  9:44 PM  Result Value Ref Range   Sodium 117 (LL) 135 - 145 mmol/L    Comment: CRITICAL RESULT CALLED TO, READ BACK BY AND VERIFIED WITH JOHN HOFFMASTER 09/13/17 2224 JML    Potassium 5.2 (H) 3.5 - 5.1 mmol/L   Chloride 85 (L) 98 - 111 mmol/L   CO2 25 22 - 32 mmol/L   Glucose, Bld 243 (H) 70 - 99 mg/dL   BUN 19 8 - 23 mg/dL   Creatinine, Ser 1.35 (H) 0.61 - 1.24 mg/dL   Calcium 8.2 (L) 8.9 - 10.3 mg/dL   GFR calc non Af Amer 55 (L) >60 mL/min   GFR calc Af Amer >60 >60 mL/min    Comment: (NOTE) The eGFR has been calculated using the CKD EPI equation. This calculation has not been validated in all clinical situations. eGFR's persistently <60 mL/min signify possible Chronic Kidney Disease.    Anion gap 7 5 - 15    Comment:  Performed at [facility], [address] [place], [place] [address]  Hepatic function panel     Status: Abnormal   Collection Time: 09/13/17  9:44 PM  Result Value Ref Range   Total Protein 6.6 6.5 - 8.1 g/dL   Albumin 3.0 (L) 3.5 - 5.0 g/dL   AST 52 (H) 15 - 41 U/L   ALT 27 0 - 44 U/L   Alkaline Phosphatase 444 (H) 38 - 126 U/L   Total Bilirubin 1.7 (H) 0.3 - 1.2 mg/dL   Bilirubin, Direct 0.7 (H) 0.0 - 0.2 mg/dL   Indirect Bilirubin 1.0 (H) 0.3 - 0.9 mg/dL    Comment: Performed at [facility], [address] [place], [place] [address]  CBC with Differential     Status: Abnormal   Collection Time: 09/13/17  9:44 PM  Result Value Ref Range   WBC 3.3 (L) 3.8 - 10.6 K/uL   RBC 4.28 (L) 4.40 - 5.90 MIL/uL   Hemoglobin 12.0 (L) 13.0 - 18.0 g/dL   HCT 35.9 (L) 40.0 - 52.0 %   MCV 83.9 80.0 - 100.0 fL   MCH 28.0 26.0 - 34.0 pg   MCHC 33.4 32.0 - 36.0 g/dL   RDW 15.6 (H) 11.5 - 14.5 %   Platelets 137 (L) 150 - 440 K/uL   Neutrophils Relative % 65 %  Neutro Abs 2.2 1.4 - 6.5 K/uL   Lymphocytes Relative 17 %   Lymphs Abs 0.5 (L) 1.0 - 3.6 K/uL   Monocytes Relative 11 %   Monocytes Absolute 0.4 0.2 - 1.0 K/uL   Eosinophils Relative 6 %   Eosinophils Absolute 0.2 0 - 0.7 K/uL   Basophils Relative 1 %   Basophils Absolute 0.0 0 - 0.1 K/uL    Comment: Performed at [facility], [address] [place], [place] [address]  Protime-INR     Status: None   Collection Time: 09/13/17  9:44 PM  Result Value Ref Range   Prothrombin Time 14.6 11.4 - 15.2 seconds   INR 1.15     Comment: Performed at [facility], [address] [place], [place] [address]  Basic metabolic panel     Status: Abnormal   Collection Time: 09/14/17  4:43 AM  Result Value Ref Range   Sodium 122 (L) 135 - 145 mmol/L   Potassium 4.5 3.5 - 5.1 mmol/L   Chloride 92 (L) 98 - 111 mmol/L   CO2 26 22 - 32 mmol/L   Glucose, Bld 110 (H) 70 - 99 mg/dL   BUN 16 8 - 23 mg/dL    Creatinine, Ser 1.24 0.61 - 1.24 mg/dL   Calcium 8.0 (L) 8.9 - 10.3 mg/dL   GFR calc non Af Amer >60 >60 mL/min   GFR calc Af Amer >60 >60 mL/min    Comment: (NOTE) The eGFR has been calculated using the CKD EPI equation. This calculation has not been validated in all clinical situations. eGFR's persistently <60 mL/min signify possible Chronic Kidney Disease.    Anion gap 4 (L) 5 - 15    Comment: Performed at [facility], [address] [place], [place] [address]  CBC     Status: Abnormal   Collection Time: 09/14/17  4:43 AM  Result Value Ref Range   WBC 3.5 (L) 3.8 - 10.6 K/uL   RBC 3.88 (L) 4.40 - 5.90 MIL/uL   Hemoglobin 10.9 (L) 13.0 - 18.0 g/dL   HCT 32.5 (L) 40.0 - 52.0 %   MCV 83.7 80.0 - 100.0 fL   MCH 28.2 26.0 - 34.0 pg   MCHC 33.7 32.0 - 36.0 g/dL   RDW 16.0 (H) 11.5 - 14.5 %   Platelets 113 (L) 150 - 440 K/uL    Comment: Performed at [facility], [address] [place], [place] [address]  Glucose, capillary     Status: Abnormal   Collection Time: 09/14/17  7:50 AM  Result Value Ref Range   Glucose-Capillary 139 (H) 70 - 99 mg/dL  Glucose, capillary     Status: Abnormal   Collection Time: 09/14/17 11:32 AM  Result Value Ref Range   Glucose-Capillary 139 (H) 70 - 99 mg/dL  Glucose, capillary     Status: Abnormal   Collection Time: 09/14/17  4:33 PM  Result Value Ref Range   Glucose-Capillary 230 (H) 70 - 99 mg/dL  Glucose, capillary     Status: Abnormal   Collection Time: 09/14/17  8:40 PM  Result Value Ref Range   Glucose-Capillary 236 (H) 70 - 99 mg/dL  Basic metabolic panel     Status: Abnormal   Collection Time: 09/15/17  5:06 AM  Result Value Ref Range   Sodium 130 (L) 135 - 145 mmol/L   Potassium 4.9 3.5 - 5.1 mmol/L   Chloride 96 (L) 98 - 111 mmol/L   CO2 26 22 -  32 mmol/L   Glucose, Bld 59 (L) 70 - 99 mg/dL   BUN 20 8 - 23 mg/dL   Creatinine, Ser 1.87 (H) 0.61 - 1.24 mg/dL   Calcium 8.2 (L) 8.9 - 10.3 mg/dL   GFR calc non  Af Amer 37 (L) >60 mL/min   GFR calc Af Amer 43 (L) >60 mL/min    Comment: (NOTE) The eGFR has been calculated using the CKD EPI equation. This calculation has not been validated in all clinical situations. eGFR's persistently <60 mL/min signify possible Chronic Kidney Disease.    Anion gap 8 5 - 15    Comment: Performed at [facility], [address] [place], [place] [address]  Ferritin     Status: None   Collection Time: 09/15/17  5:06 AM  Result Value Ref Range   Ferritin 54 24 - 336 ng/mL    Comment: Performed at [facility], [address] [place], [place] [address]  Iron and TIBC     Status: Abnormal   Collection Time: 09/15/17  5:06 AM  Result Value Ref Range   Iron 55 45 - 182 ug/dL   TIBC 243 (L) 250 - 450 ug/dL   Saturation Ratios 23 17.9 - 39.5 %   UIBC 188 ug/dL    Comment: Performed at [facility], [address] [place], [place] [address]  Vitamin B12     Status: None   Collection Time: 09/15/17  5:06 AM  Result Value Ref Range   Vitamin B-12 731 180 - 914 pg/mL    Comment: (NOTE) This assay is not validated for testing neonatal or myeloproliferative syndrome specimens for Vitamin B12 levels. Performed at [facility], [place] [address], [place], [place] [address]   Folate     Status: None   Collection Time: 09/15/17  5:06 AM  Result Value Ref Range   Folate 15.8 >5.9 ng/mL    Comment: Performed at [facility], [address] [place], [place] [address]  Glucose, capillary     Status: Abnormal   Collection Time: 09/15/17  7:19 AM  Result Value Ref Range   Glucose-Capillary 40 (LL) 70 - 99 mg/dL    Comment: REPEATED TO VERIFY, CHARGE CREDITED TEST WILL BE CREDITED Performed at [facility], [address], [place], [place] [address]    Comment 1 Notify RN   Glucose, capillary     Status: Abnormal   Collection Time: 09/15/17  7:21 AM  Result Value Ref Range   Glucose-Capillary 41 (LL) 70 - 99  mg/dL   Comment 1 Notify RN   Glucose, capillary     Status: Abnormal   Collection Time: 09/15/17  7:40 AM  Result Value Ref Range   Glucose-Capillary 152 (H) 70 - 99 mg/dL  MRSA PCR Screening     Status: None   Collection Time: 09/15/17  8:17 AM  Result Value Ref Range   MRSA by PCR NEGATIVE NEGATIVE    Comment:        The GeneXpert MRSA Assay (FDA approved for NASAL specimens only), is one component of a comprehensive MRSA colonization surveillance program. It is not intended to diagnose MRSA infection nor to guide or monitor treatment for MRSA infections. Performed at [facility], [address] [place],  [address]   Cortisol     Status: None   Collection Time: 09/15/17  9:46 AM  Result Value Ref Range   Cortisol, Plasma 35.0 ug/dL    Comment: (  NOTE) AM    6.7 - 22.6 ug/dL PM   <10.0       ug/dL Performed at [facility] [address] [place], [place] [address]   TSH     Status: Abnormal   Collection Time: 09/15/17  9:46 AM  Result Value Ref Range   TSH 5.612 (H) 0.350 - 4.500 uIU/mL    Comment: Performed by a 3rd Generation assay with a functional sensitivity of <=0.01 uIU/mL. Performed at [facility], [address] [place], [place] [address]   Glucose, capillary     Status: None   Collection Time: 09/15/17 11:39 AM  Result Value Ref Range   Glucose-Capillary 99 70 - 99 mg/dL  Glucose, capillary     Status: Abnormal   Collection Time: 09/15/17  3:01 PM  Result Value Ref Range   Glucose-Capillary 169 (H) 70 - 99 mg/dL  Glucose, capillary     Status: Abnormal   Collection Time: 09/15/17  3:47 PM  Result Value Ref Range   Glucose-Capillary 190 (H) 70 - 99 mg/dL  Glucose, capillary     Status: Abnormal   Collection Time: 09/15/17 10:21 PM  Result Value Ref Range   Glucose-Capillary 206 (H) 70 - 99 mg/dL  Magnesium     Status: None   Collection Time: 09/16/17  5:32 AM  Result Value Ref Range   Magnesium 1.8 1.7 - 2.4  mg/dL    Comment: Performed at [facility], [address] [place], [place] [address]  Phosphorus     Status: None   Collection Time: 09/16/17  5:32 AM  Result Value Ref Range   Phosphorus 4.6 2.5 - 4.6 mg/dL    Comment: Performed at [facility], [address] [place], [place] [address]  CBC     Status: Abnormal   Collection Time: 09/16/17  5:32 AM  Result Value Ref Range   WBC 11.9 (H) 3.8 - 10.6 K/uL   RBC 4.06 (L) 4.40 - 5.90 MIL/uL   Hemoglobin 11.4 (L) 13.0 - 18.0 g/dL   HCT 34.3 (L) 40.0 - 52.0 %   MCV 84.6 80.0 - 100.0 fL   MCH 28.0 26.0 - 34.0 pg   MCHC 33.1 32.0 - 36.0 g/dL   RDW 16.6 (H) 11.5 - 14.5 %   Platelets 106 (L) 150 - 440 K/uL    Comment: Performed at [facility], [address] [place], [place] [address]  Comprehensive metabolic panel     Status: Abnormal   Collection Time: 09/16/17  5:37 AM  Result Value Ref Range   Sodium 126 (L) 135 - 145 mmol/L   Potassium 5.5 (H) 3.5 - 5.1 mmol/L   Chloride 95 (L) 98 - 111 mmol/L   CO2 25 22 - 32 mmol/L   Glucose, Bld 243 (H) 70 - 99 mg/dL   BUN 33 (H) 8 - 23 mg/dL   Creatinine, Ser 1.59 (H) 0.61 - 1.24 mg/dL   Calcium 7.5 (L) 8.9 - 10.3 mg/dL   Total Protein 5.3 (L) 6.5 - 8.1 g/dL   Albumin 2.3 (L) 3.5 - 5.0 g/dL   AST 26 15 - 41 U/L   ALT 14 0 - 44 U/L   Alkaline Phosphatase 212 (H) 38 - 126 U/L   Total Bilirubin 3.2 (H) 0.3 - 1.2 mg/dL   GFR calc non Af Amer 45 (L) >60 mL/min   GFR calc Af Amer 52 (L) >60 mL/min    Comment: (NOTE) The eGFR  has been calculated using the CKD EPI equation. This calculation has not been validated in all clinical situations. eGFR's persistently <60 mL/min signify possible Chronic Kidney Disease.    Anion gap 6 5 - 15    Comment: Performed at [facility], [address] [place], [place] [address]  Glucose, capillary     Status: Abnormal   Collection Time: 09/16/17  7:37 AM  Result Value Ref Range   Glucose-Capillary 212 (H) 70 -  99 mg/dL  T4     Status: Abnormal   Collection Time: 09/16/17  7:52 AM  Result Value Ref Range   T4, Total 4.3 (L) 4.5 - 12.0 ug/dL    Comment: (NOTE) Performed At: [facility] [address] [place], [place] [id number] Rush Farmer MD [id number]   T3, free     Status: Abnormal   Collection Time: 09/16/17  7:52 AM  Result Value Ref Range   T3, Free 1.4 (L) 2.0 - 4.4 pg/mL    Comment: (NOTE) Performed At: [facility] [place], [place] [id number] Rush Farmer MD [id number]   Glucose, capillary     Status: Abnormal   Collection Time: 09/16/17  1:14 PM  Result Value Ref Range   Glucose-Capillary 250 (H) 70 - 99 mg/dL  Glucose, capillary     Status: Abnormal   Collection Time: 09/16/17  3:52 PM  Result Value Ref Range   Glucose-Capillary 298 (H) 70 - 99 mg/dL  Glucose, capillary     Status: Abnormal   Collection Time: 09/16/17  9:28 PM  Result Value Ref Range   Glucose-Capillary 430 (H) 70 - 99 mg/dL   Comment 1 Call MD NNP PA CNM   Glucose, capillary     Status: Abnormal   Collection Time: 09/16/17 11:04 PM  Result Value Ref Range   Glucose-Capillary 443 (H) 70 - 99 mg/dL  Glucose, capillary     Status: Abnormal   Collection Time: 09/17/17 12:37 AM  Result Value Ref Range   Glucose-Capillary 387 (H) 70 - 99 mg/dL  Basic metabolic panel     Status: Abnormal   Collection Time: 09/17/17  3:31 AM  Result Value Ref Range   Sodium 124 (L) 135 - 145 mmol/L   Potassium 5.3 (H) 3.5 - 5.1 mmol/L   Chloride 92 (L) 98 - 111 mmol/L   CO2 26 22 - 32 mmol/L   Glucose, Bld 279 (H) 70 - 99 mg/dL   BUN 45 (H) 8 - 23 mg/dL   Creatinine, Ser 1.46 (H) 0.61 - 1.24 mg/dL   Calcium 7.7 (L) 8.9 - 10.3 mg/dL   GFR calc non Af Amer 50 (L) >60 mL/min   GFR calc Af Amer 58 (L) >60 mL/min    Comment: (NOTE) The eGFR has been calculated using the CKD EPI equation. This calculation has not been validated in all clinical situations. eGFR's persistently <60  mL/min signify possible Chronic Kidney Disease.    Anion gap 6 5 - 15    Comment: Performed at [facility], [address] [place], [place] [address]  CBC     Status: Abnormal   Collection Time: 09/17/17  3:31 AM  Result Value Ref Range   WBC 3.8 3.8 - 10.6 K/uL   RBC 3.35 (L) 4.40 - 5.90 MIL/uL   Hemoglobin 9.6 (L) 13.0 - 18.0 g/dL   HCT 28.3 (L) 40.0 - 52.0 %   MCV 84.4 80.0 - 100.0 fL   MCH 28.7 26.0 - 34.0  pg   MCHC 34.0 32.0 - 36.0 g/dL   RDW 16.4 (H) 11.5 - 14.5 %   Platelets 68 (L) 150 - 440 K/uL    Comment: RESULT REPEATED AND VERIFIED Performed at [facility], [address] [place], [place] [address]   Glucose, capillary     Status: Abnormal   Collection Time: 09/17/17  7:09 AM  Result Value Ref Range   Glucose-Capillary 190 (H) 70 - 99 mg/dL  Glucose, capillary     Status: Abnormal   Collection Time: 09/17/17  7:25 AM  Result Value Ref Range   Glucose-Capillary 175 (H) 70 - 99 mg/dL  Glucose, capillary     Status: Abnormal   Collection Time: 09/17/17 11:57 AM  Result Value Ref Range   Glucose-Capillary 237 (H) 70 - 99 mg/dL  Basic metabolic panel     Status: Abnormal   Collection Time: 09/17/17 12:27 PM  Result Value Ref Range   Sodium 124 (L) 135 - 145 mmol/L   Potassium 5.5 (H) 3.5 - 5.1 mmol/L   Chloride 93 (L) 98 - 111 mmol/L   CO2 24 22 - 32 mmol/L   Glucose, Bld 255 (H) 70 - 99 mg/dL   BUN 45 (H) 8 - 23 mg/dL   Creatinine, Ser 1.27 (H) 0.61 - 1.24 mg/dL   Calcium 7.8 (L) 8.9 - 10.3 mg/dL   GFR calc non Af Amer 59 (L) >60 mL/min   GFR calc Af Amer >60 >60 mL/min    Comment: (NOTE) The eGFR has been calculated using the CKD EPI equation. This calculation has not been validated in all clinical situations. eGFR's persistently <60 mL/min signify possible Chronic Kidney Disease.    Anion gap 7 5 - 15    Comment: Performed at [facility], [address] [place], [place] [address]  Glucose, capillary     Status:  Abnormal   Collection Time: 09/17/17  4:40 PM  Result Value Ref Range   Glucose-Capillary 196 (H) 70 - 99 mg/dL  Glucose, capillary     Status: Abnormal   Collection Time: 09/17/17  9:55 PM  Result Value Ref Range   Glucose-Capillary 273 (H) 70 - 99 mg/dL  Basic metabolic panel     Status: Abnormal   Collection Time: 09/18/17  4:53 AM  Result Value Ref Range   Sodium 130 (L) 135 - 145 mmol/L   Potassium 4.2 3.5 - 5.1 mmol/L   Chloride 97 (L) 98 - 111 mmol/L   CO2 26 22 - 32 mmol/L   Glucose, Bld 180 (H) 70 - 99 mg/dL   BUN 49 (H) 8 - 23 mg/dL   Creatinine, Ser 1.16 0.61 - 1.24 mg/dL   Calcium 8.1 (L) 8.9 - 10.3 mg/dL   GFR calc non Af Amer >60 >60 mL/min   GFR calc Af Amer >60 >60 mL/min    Comment: (NOTE) The eGFR has been calculated using the CKD EPI equation. This calculation has not been validated in all clinical situations. eGFR's persistently <60 mL/min signify possible Chronic Kidney Disease.    Anion gap 7 5 - 15    Comment: Performed at [facility], [address] [place], [place] [address]  Glucose, capillary     Status: None   Collection Time: 09/18/17  7:44 AM  Result Value Ref Range   Glucose-Capillary 92 70 - 99 mg/dL   Comment 1 Notify RN   Lipase, blood     Status: None   Collection Time: 09/20/17  5:28 PM  Result Value Ref Range   Lipase 40 11 - 51 U/L    Comment: Performed at [facility], [address] [place], [place] [address]  Comprehensive metabolic panel     Status: Abnormal   Collection Time: 09/20/17  5:28 PM  Result Value Ref Range   Sodium 128 (L) 135 - 145 mmol/L   Potassium 4.8 3.5 - 5.1 mmol/L   Chloride 94 (L) 98 - 111 mmol/L   CO2 27 22 - 32 mmol/L   Glucose, Bld 194 (H) 70 - 99 mg/dL   BUN 32 (H) 8 - 23 mg/dL   Creatinine, Ser 1.15 0.61 - 1.24 mg/dL   Calcium 7.9 (L) 8.9 - 10.3 mg/dL   Total Protein 5.8 (L) 6.5 - 8.1 g/dL   Albumin 2.6 (L) 3.5 - 5.0 g/dL   AST 37 15 - 41 U/L   ALT 32 0 - 44 U/L   Alkaline  Phosphatase 391 (H) 38 - 126 U/L   Total Bilirubin 3.9 (H) 0.3 - 1.2 mg/dL   GFR calc non Af Amer >60 >60 mL/min   GFR calc Af Amer >60 >60 mL/min    Comment: (NOTE) The eGFR has been calculated using the CKD EPI equation. This calculation has not been validated in all clinical situations. eGFR's persistently <60 mL/min signify possible Chronic Kidney Disease.    Anion gap 7 5 - 15    Comment: Performed at [facility], [address] [place], [place] [address]  CBC     Status: Abnormal   Collection Time: 09/20/17  5:28 PM  Result Value Ref Range   WBC 7.6 3.8 - 10.6 K/uL   RBC 4.48 4.40 - 5.90 MIL/uL   Hemoglobin 12.7 (L) 13.0 - 18.0 g/dL   HCT 37.5 (L) 40.0 - 52.0 %   MCV 83.7 80.0 - 100.0 fL   MCH 28.4 26.0 - 34.0 pg   MCHC 33.9 32.0 - 36.0 g/dL   RDW 15.8 (H) 11.5 - 14.5 %   Platelets 125 (L) 150 - 440 K/uL    Comment: Performed at [facility], [address] [place], [place] [address]  POCT glycosylated hemoglobin (Hb A1C)     Status: Abnormal   Collection Time: 09/23/17 11:02 AM  Result Value Ref Range   Hemoglobin A1C 8.9 (A) 4.0 - 5.6 %   HbA1c POC (<> result, manual entry)     HbA1c, POC (prediabetic range)     HbA1c, POC (controlled diabetic range)    Comprehensive Metabolic Panel (CMET)     Status: Abnormal   Collection Time: 09/23/17  1:36 PM  Result Value Ref Range   Glucose 139 (H) 65 - 99 mg/dL   BUN 39 (H) 8 - 27 mg/dL   Creatinine, Ser 1.51 (H) 0.76 - 1.27 mg/dL   GFR calc non Af Amer 49 (L) >59 mL/min/1.73   GFR calc Af Amer 57 (L) >59 mL/min/1.73   BUN/Creatinine Ratio 26 (H) 10 - 24   Sodium 122 (L) 134 - 144 mmol/L   Potassium 5.5 (H) 3.5 - 5.2 mmol/L   Chloride 85 (L) 96 - 106 mmol/L   CO2 22 20 - 29 mmol/L   Calcium 7.9 (L) 8.6 - 10.2 mg/dL   Total Protein 5.6 (L) 6.0 - 8.5 g/dL   Albumin 2.8 (L) 3.6 - 4.8 g/dL   Globulin, Total 2.8 1.5 - 4.5 g/dL   Albumin/Globulin Ratio 1.0 (L) 1.2 - 2.2   Bilirubin Total  3.9 (H) 0.0 -  1.2 mg/dL   Alkaline Phosphatase 430 (H) 39 - 117 IU/L   AST 34 0 - 40 IU/L   ALT 22 0 - 44 IU/L  INR/PT     Status: Abnormal   Collection Time: 09/23/17  1:36 PM  Result Value Ref Range   INR 1.2 0.8 - 1.2    Comment: Reference interval is for non-anticoagulated patients. Suggested INR therapeutic range for Vitamin K antagonist therapy:    Standard Dose (moderate intensity                   therapeutic range):       2.0 - 3.0    Higher intensity therapeutic range       2.5 - 3.5    Prothrombin Time 12.4 (H) 9.1 - 12.0 sec  Basic Metabolic Panel (BMET)     Status: Abnormal   Collection Time: 09/24/17  2:58 PM  Result Value Ref Range   Glucose 133 (H) 65 - 99 mg/dL   BUN 37 (H) 8 - 27 mg/dL   Creatinine, Ser 1.35 (H) 0.76 - 1.27 mg/dL   GFR calc non Af Amer 56 (L) >59 mL/min/1.73   GFR calc Af Amer 65 >59 mL/min/1.73   BUN/Creatinine Ratio 27 (H) 10 - 24   Sodium 121 (L) 134 - 144 mmol/L   Potassium 5.5 (H) 3.5 - 5.2 mmol/L   Chloride 85 (L) 96 - 106 mmol/L   CO2 23 20 - 29 mmol/L   Calcium 8.1 (L) 8.6 - 10.2 mg/dL  Thyroid Panel With TSH     Status: Abnormal   Collection Time: 09/25/17  8:55 AM  Result Value Ref Range   TSH 12.550 (H) 0.450 - 4.500 uIU/mL   T4, Total 7.5 4.5 - 12.0 ug/dL   T3 Uptake Ratio 32 24 - 39 %   Free Thyroxine Index 2.4 1.2 - 4.9    Comment: (NOTE) Performed At: [facility] [place], [place] [id number] Rush Farmer MD [id number]   CBC with Differential/Platelet     Status: Abnormal   Collection Time: 09/25/17  8:55 AM  Result Value Ref Range   WBC 4.7 3.8 - 10.6 K/uL   RBC 3.72 (L) 4.40 - 5.90 MIL/uL   Hemoglobin 10.6 (L) 13.0 - 18.0 g/dL   HCT 31.4 (L) 40.0 - 52.0 %   MCV 84.5 80.0 - 100.0 fL   MCH 28.5 26.0 - 34.0 pg   MCHC 33.8 32.0 - 36.0 g/dL   RDW 16.3 (H) 11.5 - 14.5 %   Platelets 176 150 - 440 K/uL   Neutrophils Relative % 78 %   Neutro Abs 3.7 1.4 - 6.5 K/uL   Lymphocytes Relative 9 %   Lymphs Abs  0.4 (L) 1.0 - 3.6 K/uL   Monocytes Relative 10 %   Monocytes Absolute 0.5 0.2 - 1.0 K/uL   Eosinophils Relative 2 %   Eosinophils Absolute 0.1 0 - 0.7 K/uL   Basophils Relative 1 %   Basophils Absolute 0.0 0 - 0.1 K/uL    Comment: Performed at [facility], [address] [place], [place] [address]  Comprehensive metabolic panel     Status: Abnormal   Collection Time: 09/25/17  8:55 AM  Result Value Ref Range   Sodium 120 (L) 135 - 145 mmol/L   Potassium 5.1 3.5 - 5.1 mmol/L   Chloride 86 (L) 98 - 111 mmol/L   CO2 26 22 - 32 mmol/L  Glucose, Bld 303 (H) 70 - 99 mg/dL   BUN 38 (H) 8 - 23 mg/dL   Creatinine, Ser 1.48 (H) 0.61 - 1.24 mg/dL   Calcium 8.1 (L) 8.9 - 10.3 mg/dL   Total Protein 6.1 (L) 6.5 - 8.1 g/dL   Albumin 2.3 (L) 3.5 - 5.0 g/dL   AST 29 15 - 41 U/L   ALT 19 0 - 44 U/L   Alkaline Phosphatase 347 (H) 38 - 126 U/L   Total Bilirubin 2.9 (H) 0.3 - 1.2 mg/dL   GFR calc non Af Amer 49 (L) >60 mL/min   GFR calc Af Amer 57 (L) >60 mL/min    Comment: (NOTE) The eGFR has been calculated using the CKD EPI equation. This calculation has not been validated in all clinical situations. eGFR's persistently <60 mL/min signify possible Chronic Kidney Disease.    Anion gap 8 5 - 15    Comment: Performed at [facility], [address] [place], [place] [address]   Objective  Body mass index is 23.46 kg/m. Wt Readings from Last 3 Encounters:  10/03/17 168 lb 3.2 oz (76.3 kg)  09/26/17 160 lb 12.8 oz (72.9 kg)  09/25/17 161 lb (73 kg)   Temp Readings from Last 3 Encounters:  10/03/17 98.6 F (37 C) (Oral)  09/25/17 (!) 97 F (36.1 C) (Tympanic)  09/23/17 98.2 F (36.8 C) (Oral)   BP Readings from Last 3 Encounters:  10/03/17 112/66  09/26/17 114/77  09/25/17 105/70   Pulse Readings from Last 3 Encounters:  10/03/17 91  09/26/17 89  09/25/17 95    Physical Exam  Constitutional: He is oriented to person, place, and time. Vital signs are normal. He  appears well-developed and well-nourished. He is cooperative.  HENT:  Head: Normocephalic and atraumatic.  Mouth/Throat: Oropharynx is clear and moist and mucous membranes are normal.  Eyes: Pupils are equal, round, and reactive to light. Conjunctivae are normal.  Cardiovascular: Normal rate, regular rhythm and normal heart sounds.  Pulmonary/Chest: Effort normal and breath sounds normal.  Abdominal: Soft. Normal appearance and bowel sounds are normal. He exhibits distension and fluid wave. There is no tenderness.  Neurological: He is alert and oriented to person, place, and time. Gait normal.  Skin: Skin is warm, dry and intact.  Psychiatric: He has a normal mood and affect. His speech is normal and behavior is normal. Judgment and thought content normal. Cognition and memory are normal.  Nursing note and vitals reviewed.   Assessment   1. Liver cirrhosis with ascites  2. Testicular edema and pain likely hydrocele related to #1  3. Leg edema  4. Hypothyroidism  5. HM Plan   1. F/u GI  Tips 10/15/17 in [place] 2. Refer [facility] urology 2nd opinion  Trial of Flomax to see if helps urinary retention 2/2 edema 3. Lasix 20 mg x 3 days  4. Levo 25 recheck TSH in 6 weeks  5.  Had flu shot  Tdap given today  pna 23 01/28/15 and prevnar 11/30/13  Disc shingrix in future   TSH need to monitor will not treat for nowconsider thyroid US Former smoker PSA neg5/8/19  Colonoscopy get records [facility] in [place] release signed today    Provider: Dr. Olivia Mackie [name]-Internal Medicine

## 2017-10-04 NOTE — Telephone Encounter (Signed)
Spoke with pt regarding the issue he needs addressed. Pt states he was informed by urologist that he could not have hydrocele drained due to the liver ascites. I explained to pt that I will relay this information to Dr. Vicente Males.

## 2017-10-07 ENCOUNTER — Other Ambulatory Visit: Payer: Self-pay | Admitting: Internal Medicine

## 2017-10-07 ENCOUNTER — Telehealth: Payer: Self-pay | Admitting: Internal Medicine

## 2017-10-07 DIAGNOSIS — N401 Enlarged prostate with lower urinary tract symptoms: Secondary | ICD-10-CM

## 2017-10-07 MED ORDER — TAMSULOSIN HCL 0.4 MG PO CAPS: 0.4000 mg | ORAL_CAPSULE | ORAL | 3 refills | Status: AC

## 2017-10-07 NOTE — Telephone Encounter (Signed)
Patient requesting refill on Flomax from historical provider ok to fill?

## 2017-10-07 NOTE — Progress Notes (Signed)
[place]  [phone] [phone]) [phone]  ID: Rodney Stewart OB: April 26, 1955  MR#: [id number]  BSJ#:[id number]  Patient Care Team: [name], [name], MD as PCP - General (Internal Medicine)  CHIEF COMPLAINT: Stage IV renal cell carcinoma with bilateral lung metastases.  INTERVAL HISTORY: Patient returns to clinic today for further evaluation and continuation of nivolumab.  His performance status appears to be declining secondary to his cirrhosis.  Although he has abdominal distention, he denies any discomfort or pain. He continues to have significant peripheral neuropathy in both feet secondary to diabetes, but has no other neurologic complaints. He denies any fevers, chills, or recent illnesses. He has a fair appetite.  He denies any chest pain, shortness of breath, cough, or hemoptysis. He has no nausea, vomiting, constipation, or diarrhea. He has no melena or hematochezia. He has no urinary complaints.  Patient offers no specific complaints today.  REVIEW OF SYSTEMS:   Review of Systems  Constitutional: Negative.  Negative for fever, malaise/fatigue and weight loss.  HENT: Negative.  Negative for congestion.   Eyes: Negative for blurred vision.  Respiratory: Negative.  Negative for cough and shortness of breath.   Cardiovascular: Negative.  Negative for chest pain and leg swelling.  Gastrointestinal: Negative.  Negative for abdominal pain, constipation, diarrhea, nausea and vomiting.  Genitourinary: Negative.  Negative for dysuria.  Musculoskeletal: Positive for joint pain. Negative for back pain.  Skin: Negative.  Negative for rash.  Neurological: Positive for tingling and sensory change. Negative for focal weakness, weakness and headaches.  Psychiatric/Behavioral: Negative.  The patient is not nervous/anxious and does not have insomnia.    As per HPI. Otherwise, a complete review of systems is negative.   PAST MEDICAL HISTORY: Past Medical  History:  Diagnosis Date  . Anemia   . BPH (benign prostatic hyperplasia)   . CKD (chronic kidney disease) stage 3, GFR 30-59 ml/min (HCC)   . Clear cell renal cell carcinoma ([place]) 2014   Left Nephrectomy.  . Colon polyps   . Diabetes mellitus without complication ([place])    type 2   . Dyspnea    with exertion  . GERD (gastroesophageal reflux disease)   . History of gout   . History of nephrectomy    Left  . Hypertension   . Neuropathy   . Renal Cancer    Renal Cancer  . Renal cell carcinoma of left kidney (HCC)    mets to lungs  . Umbilical hernia     PAST SURGICAL HISTORY: Past Surgical History:  Procedure Laterality Date  . BACK SURGERY     ruptured disc 1991   . ENDOBRONCHIAL ULTRASOUND N/A 10/14/2015   Procedure: ENDOBRONCHIAL ULTRASOUND;  Surgeon: [name], MD;  Location: [facility] ORS;  Service: Pulmonary;  Laterality: N/A;  . ESOPHAGOGASTRODUODENOSCOPY (EGD) WITH PROPOFOL N/A 04/15/2017   Procedure: ESOPHAGOGASTRODUODENOSCOPY (EGD) WITH PROPOFOL;  Surgeon: Jonathon Bellows, MD;  Location[facility] ENDOS[facility];  Service: Gastroenterology;  Laterality: N/A;  Screen for esophageal varices  . EYE SURGERY Bilateral    Cataract Extraction with IOL  . KNEE SURGERY Right   . NEPHRECTOMY     left kidney 2014 renal cell cancer     FAMILY HISTORY: Family History  Problem Relation Age of Onset  . Ovarian cancer Mother   . Diabetes Father   . Hypertension Father     ADVANCED DIRECTIVES (Y/N):  N  HEALTH MAINTENANCE: Social History   Tobacco Use  . Smoking status: Former Smoker  Packs/day: 1.00    Types: Cigarettes    Last attempt to quit: 04/15/1993    Years since quitting: 24.5  . Smokeless tobacco: Never Used  Substance Use Topics  . Alcohol use: Not Currently  . Drug use: No     Colonoscopy:  PAP:  Bone density:  Lipid panel:  No Known Allergies  Current Outpatient Medications  Medication Sig Dispense Refill  . allopurinol (ZYLOPRIM) 100 MG tablet  Take 1 tablet (100 mg total) by mouth daily. 90 tablet 3  . ALPRAZolam (XANAX) 1 MG tablet Take 1 mg by mouth as needed for sleep.    Marland Kitchen aspirin EC 81 MG tablet Take 81 mg by mouth daily.    . feeding supplement, ENSURE ENLIVE, (ENSURE ENLIVE) LIQD Take 237 mLs by mouth 3 (three) times daily between meals. 237 mL 12  . gabapentin (NEURONTIN) 100 MG capsule Take 2 capsules (200 mg total) by mouth 3 (three) times daily. (Patient taking differently: Take 200 mg by mouth 2 (two) times daily. ) 360 capsule 5  . HYDROcodone-acetaminophen (NORCO) 10-325 MG tablet TK 1 T PO BID PRN  0  . insulin aspart (NOVOLOG) 100 UNIT/ML FlexPen Before meals 131-180 2 units, 181-240 4 units, 241-300 6 units, 301-350 8 units, 351-400 10 units, >400 12 units 15 mL 11  . Insulin Detemir (LEVEMIR FLEXTOUCH) 100 UNIT/ML Pen Inject 40 Units into the skin daily at 10 pm. 12 pen 12  . Insulin Pen Needle 32G X 4 MM MISC 1 Device by Does not apply route 2 (two) times daily. E11.9 200 each 12  . levothyroxine (SYNTHROID, LEVOTHROID) 25 MCG tablet Take 1 tablet (25 mcg total) by mouth daily before breakfast. 30 minutes 90 tablet 1  . lovastatin (MEVACOR) 20 MG tablet Take 1 tablet (20 mg total) by mouth daily at 6 PM. 90 tablet 3  . magic mouthwash w/lidocaine SOLN Take 5 mLs by mouth 3 (three) times daily as needed for mouth pain. Swish and spit 250 mL 0  . ONE TOUCH ULTRA TEST test strip Bid 180 each 3  . oxyCODONE (ROXICODONE) 5 MG immediate release tablet Take 1 tablet (5 mg total) by mouth every 6 (six) hours as needed for severe pain or breakthrough pain. 45 tablet 0  . tamsulosin (FLOMAX) 0.4 MG CAPS capsule Take 1 capsule (0.4 mg total) by mouth every other day. With supper 90 capsule 3  . valACYclovir (VALTREX) 1000 MG tablet Take 1 tablet (1,000 mg total) by mouth 2 (two) times daily. X 5-10 days with food 30 tablet 5  . furosemide (LASIX) 20 MG tablet Take 1 tablet (20 mg total) by mouth daily. In am (Patient not taking:  Reported on 10/09/2017) 3 tablet 0   Current Facility-Administered Medications  Medication Dose Route Frequency Provider Last Rate Last Dose  . albumin human 25 % solution 12.5 g  12.5 g Intravenous Once Jonathon Bellows, MD      . albumin human 25 % solution 12.5 g  12.5 g Intravenous Once Jonathon Bellows, MD      . albumin human 5 % solution 25 g  25 g Intravenous Once Jonathon Bellows, MD        OBJECTIVE: Vitals:   10/09/17 1012  BP: 122/79  Pulse: (!) 108  Resp: 18  Temp: 98.2 F (36.8 C)     Body mass index is 24.16 kg/m.    ECOG FS:0 - Asymptomatic  General: Well-developed, well-nourished, no acute distress. Eyes: Pink conjunctiva, anicteric sclera.  HEENT: Normocephalic, moist mucous membranes, clear oropharnyx. Lungs: Clear to auscultation bilaterally. Heart: Regular rate and rhythm. No rubs, murmurs, or gallops. Abdomen: Distended, nontender. Musculoskeletal: No edema, cyanosis, or clubbing. Neuro: Alert, answering all questions appropriately. Cranial nerves grossly intact. Skin: No rashes or petechiae noted. Psych: Normal affect.    LAB RESULTS:  Lab Results  Component Value Date   NA 120 (L) 10/09/2017   K 5.3 (H) 10/09/2017   CL 85 (L) 10/09/2017   CO2 28 10/09/2017   GLUCOSE 86 10/09/2017   BUN 31 (H) 10/09/2017   CREATININE 1.46 (H) 10/09/2017   CALCIUM 8.0 (L) 10/09/2017   PROT 7.0 10/09/2017   ALBUMIN 2.2 (L) 10/09/2017   AST 46 (H) 10/09/2017   ALT 18 10/09/2017   ALKPHOS 343 (H) 10/09/2017   BILITOT 2.1 (H) 10/09/2017   GFRNONAA 50 (L) 10/09/2017   GFRAA 58 (L) 10/09/2017    Lab Results  Component Value Date   WBC 5.2 10/09/2017   NEUTROABS 3.9 10/09/2017   HGB 10.6 (L) 10/09/2017   HCT 32.0 (L) 10/09/2017   MCV 83.6 10/09/2017   PLT 134 (L) 10/09/2017   Lab Results  Component Value Date   IRON 55 09/15/2017   TIBC 243 (L) 09/15/2017   IRONPCTSAT 23 09/15/2017   Lab Results  Component Value Date   FERRITIN 54 09/15/2017     STUDIES: Ct  Abdomen Pelvis W Contrast  Result Date: 09/20/2017 CLINICAL DATA:  Bilateral lower abdominal pain radiating across the lower abdomen. Patient was released from hospital Wednesday after paracentesis. Patient has had umbilical hernia for years. EXAM: CT ABDOMEN AND PELVIS WITH CONTRAST TECHNIQUE: Multidetector CT imaging of the abdomen and pelvis was performed using the standard protocol following bolus administration of intravenous contrast. CONTRAST:  133mL ISOVUE-300 IOPAMIDOL (ISOVUE-300) INJECTION 61% COMPARISON:  09/13/2017 CT FINDINGS: Lower chest: Normal heart size without pericardial effusion. Redemonstration of dominant pulmonary mass in the left lower lobe measuring approximately 2.7 x 2.4 cm allowing for slice sampling differences in operator dependent imaging. Smaller scattered pulmonary nodules are also present within both lower lobes. Trace right greater than left pleural effusions. Hepatobiliary: Cirrhotic appearance of the liver with heterogeneous somewhat wedge-shaped area of hypo density involving the medial segment of left hepatic lobe but without definable mass. Nondistended gallbladder with layering gallstones. No biliary dilatation. Pancreas: Unremarkable Spleen: Enlarged up to 16.5 cm without focal mass stable since recent. Adrenals/Urinary Tract: Surgically absent left adrenal gland and kidney. The right adrenal gland and right kidney are unremarkable. Stomach/Bowel: The stomach is decompressed in appearance. The duodenal sweep and ligament of Treitz are normal in position. No small bowel dilatation or obstruction is identified. Mildly edematous appearance of the small bowel likely sympathetic from surrounding ascites. Likewise there is somewhat thickened appearance of the cecum and ascending colon since prior with submucosal fatty change. Possibility of a colitis is not entirely excluded. Centralized loops of small bowel secondary to large volume of ascites. No bowel obstruction.  Vascular/Lymphatic: Nonaneurysmal thoracic aorta with atherosclerosis. No adenopathy. Multiple upper abdominal varices. Reproductive: Mildly enlarged prostate. Other: Ventral hernia containing loculated fluid is again redemonstrated without definite herniation of bowel given lack of more proximal bowel this measures 10.6 x 9.6 x 2.7 cm (transverse by CC by AP dimension) with the mouth of hernia measuring 4.2 x 1.7 cm in transverse by craniocaudad. Previously this hernia was estimated at 9.5 cm transverse by 2.7 cm in thickness. Prominent enhancing omental vessels project into the hernia as before.  Moderate to large volume of ascites redemonstrated. Musculoskeletal: Degenerative disc disease L3-4 and L4-5. IMPRESSION: 1. Moderate to large volume of ascites despite recent paracentesis suggests reaccumulation of fluid given similar appearance of the abdomen and pelvis 09/13/2017 (patient had a paracentesis performed on 09/16/2017). 2. Cirrhotic liver with splenomegaly and stigmata of portal hypertension. 3. Slightly wedge shaped perfusion anomaly of the medial segment of left hepatic lobe similar to prior. A mass cannot be entirely excluded. 4. Stable pulmonary nodules with dominant mass in left lower lobe. 5. Large multiloculated fluid containing ventral hernia with herniation of omental vessels as before currently estimated at 10.6 x 9.6 x 2.7 cm. This appears more prominent than on prior. Electronically Signed   By: Ashley Royalty M.D.   On: 09/20/2017 21:01   Ct Abdomen Pelvis W Contrast  Result Date: 09/13/2017 CLINICAL DATA:  Abdominal hernia with liver failure EXAM: CT ABDOMEN AND PELVIS WITH CONTRAST TECHNIQUE: Multidetector CT imaging of the abdomen and pelvis was performed using the standard protocol following bolus administration of intravenous contrast. CONTRAST:  152mL ISOVUE-300 IOPAMIDOL (ISOVUE-300) INJECTION 61% COMPARISON:  CT 08/30/2017, 06/07/2017, PET CT 10/15/2016 FINDINGS: Lower chest: Lung  bases again demonstrate multiple pulmonary nodules. Left lower lobe lung mass measures 2.6 x 2.8 cm, compared with 2.4 x 2.8 cm previously. Multiple additional small lung nodules in the lung bases are again noted. No pleural effusion. The heart size is normal. Hepatobiliary: Cirrhotic liver. Heterogenous hypodensity at the junction of the right and left lobes without well defined mass. Appearance is change from 08/30/2017. Calcified gallstone. No biliary dilatation. Pancreas: Unremarkable. No pancreatic ductal dilatation or surrounding inflammatory changes. Spleen: Enlarged, measuring 16 cm. Adrenals/Urinary Tract: Right adrenal gland and kidney are normal. Status post removal of left adrenal gland and left kidney. Bladder is slightly thick walled. Stomach/Bowel: The stomach is nonenlarged. No dilated small bowel. No colon wall thickening. Negative appendix. Vascular/Lymphatic: Nonaneurysmal aorta. Moderate aortic atherosclerosis. No significant adenopathy. Multiple upper abdominal varices. Reproductive: Slightly enlarged prostate Other: Moderate to large volume of ascites in the abdomen. Ventral hernia containing slightly loculated fluid measuring 9.5 x 2.1 cm, appears decreased compared to most recent CT. Prominent vessels are noted in the hernia. Large left inguinal fluid collection in addition to large left and small right hydrocele. Musculoskeletal: Degenerative changes. No acute or suspicious abnormality. IMPRESSION: 1. Periumbilical ventral hernia containing slightly loculated fluid collection measuring approximately 9.5 x 2.7 cm, slight lead decreased as compared with the most recent CT. Prominent enhancing vessels are noted in the hernia. 2. Cirrhosis of the liver with portal hypertension, splenomegaly and varices. Moderate to large volume of ascites within the abdomen and pelvis. 3. New heterogenous hypoenhancement within the liver, possible mass versus altered perfusion dynamics. 4. Essentially stable  pulmonary nodules within the lower lobes. 5. Status post left nephrectomy and removal of left adrenal gland 6. Gallstones 7. Large left inguinal fluid collection and large left hydrocele with small right hydrocele Electronically Signed   By: Donavan Foil M.D.   On: 09/13/2017 23:20   US Paracentesis  Result Date: 09/16/2017 CLINICAL DATA:  Cirrhosis, recurrent large volume abdominal ascites. He had some skin breakdown of his umbilical hernia resulting in leakage of a fair amount of peritoneal fluid while at home. EXAM: ULTRASOUND GUIDED PARACENTESIS TECHNIQUE: The procedure, risks (including but not limited to bleeding, infection, organ damage ), benefits, and alternatives were explained to the patient. Questions regarding the procedure were encouraged and answered. The patient understands and consents to the  procedure. Survey ultrasound of the abdomen was performed and an appropriate skin entry site in the lateral right lower abdomen was selected. Skin site was marked, prepped with chlorhexadine, draped in usual sterile fashion, and infiltrated locally with 1% lidocaine. A Safe-T-Centesis needle was advanced into the peritoneal space until fluid could be aspirated. The sheath was advanced and the needle removed. 2 L of clear yellow ascites were aspirated. The patient tolerated the procedure well. COMPLICATIONS: COMPLICATIONS none IMPRESSION: Technically successful ultrasound guided paracentesis, removing 2 L ascites. Electronically Signed   By: Lucrezia Europe M.D.   On: 09/16/2017 12:54   US Scrotum W/doppler  Result Date: 09/25/2017 CLINICAL DATA:  Scrotal swelling EXAM: SCROTAL ULTRASOUND DOPPLER ULTRASOUND OF THE TESTICLES TECHNIQUE: Complete ultrasound examination of the testicles, epididymis, and other scrotal structures was performed. Color and spectral Doppler ultrasound were also utilized to evaluate blood flow to the testicles. COMPARISON:  None. FINDINGS: Right testicle Measurements: 4.1 x 2.6 x 2.7  cm. No mass or microlithiasis visualized. Left testicle Measurements: 4.1 x 2.7 x 2.8 cm. No mass or microlithiasis visualized. Right epididymis:  Normal in size and appearance. Left epididymis:  1.1 cm epididymal head cyst or spermatocele. Hydrocele:  Large bilateral hydroceles Varicocele:  None visualized. Pulsed Doppler interrogation of both testes demonstrates normal low resistance arterial and venous waveforms bilaterally. Other: Complex fluid collections are noted in the inguinal canal regions bilaterally with numerous internal septations. These may be related to hernias containing loculated fluid. IMPRESSION: No focal testicular abnormality or evidence of testicular torsion. Large bilateral hydroceles. Complex fluid-filled areas within the inguinal canals bilaterally, likely loculated ascites related to the large volume abdominal ascites seen on prior CT. Electronically Signed   By: Rolm Baptise M.D.   On: 09/25/2017 13:23   [place] Ekg Site Rite  Result Date: 09/15/2017 If Site Rite image not attached, placement could not be confirmed due to current cardiac rhythm.    Oncology history: Patient underwent left nephrectomy on May 16, 2012 which revealed a clear cell grade 2 renal cell carcinoma, stage TIIIa, N0, M0. Tumor size of 16 cm. Patient was noted to have renal vein involvement, but no other structures were involved. 0 of 2 lymph nodes were negative for disease. PET scan on October 20, 2015 revealed metastatic disease and patient was initiated on Votrient. This was subsequently discontinued in March 2018 secondary to progression of disease. Patient initiated second line treatment with nivolumab on Monday, April 02, 2016  ASSESSMENT: Stage IV left renal cell carcinoma with metastasis to the lungs  PLAN:    1. Stage IV left renal cell carcinoma with metastasis to the lungs: CT scan results from August 30, 2017 reviewed independently with stable mediastinal/hilar adenopathy as well as pulmonary  metastatic disease.  Patient continues to have large ascites.  Proceed with patient's next infusion of nivolumab today.  He will receive a flat dose of 240 mg every 2 weeks until intolerable side effects or significant progression of disease.  Return to clinic in 2 weeks for nivolumab only and then in 4 weeks for further evaluation and continuation of treatment.  Plan to reimage in approximately December 2019.   2.  Anemia: Patient's hemoglobin is decreased, but stable at 10.6.  Monitor. 3.  Pancytopenia: Mild, monitor. 4.  Renal insufficiency: Patient's creatinine has trended up and is now 1.46.  Monitor. 5. Peripheral neuropathy: Chronic and unchanged.  Likely secondary to diabetes, continue current dose of gabapentin.  Continue evaluation and treatment per primary care.  6.  Cirrhosis/ascites: Appreciate GI input.  CT scan results as above.  Patient continues to require periodic paracentesis.  Continue monitoring and treatment per GI.  Reinitiate Lasix.  Patient has discontinued Aldactone. 7. Hyperbilirubinemia: Secondary to cirrhosis.  Patient's bilirubin is trending down and is now 2.1.   8.  Hyponatremia: Chronic and unchanged.  Likely secondary to underlying cirrhosis. 9.  Hyperkalemia: Patient has been instructed to reinitiate his Lasix.  Discontinue Aldactone as above.  Continue insulin as prescribed.  Patient expressed understanding and was in agreement with this plan. He also understands that He can call clinic at any time with any questions, concerns, or complaints.   Cancer Staging Metastatic renal cell carcinoma to lung [facility]) Staging form: Kidney, AJCC 7th Edition - Clinical stage from 10/12/2015: Stage IV (TX, N0, M1) - Signed by Lloyd Huger, MD on 10/12/2015   Lloyd Huger, MD 10/11/17 12:12 PM

## 2017-10-07 NOTE — Telephone Encounter (Signed)
Pt needs a refill on tamsulosin (FLOMAX) 0.4 MG CAPS capsule Sent to [facility] Pt is completely out

## 2017-10-08 ENCOUNTER — Other Ambulatory Visit: Payer: Self-pay | Admitting: *Deleted

## 2017-10-08 DIAGNOSIS — C7800 Secondary malignant neoplasm of unspecified lung: Principal | ICD-10-CM

## 2017-10-08 DIAGNOSIS — C649 Malignant neoplasm of unspecified kidney, except renal pelvis: Secondary | ICD-10-CM

## 2017-10-09 ENCOUNTER — Inpatient Hospital Stay: Payer: Medicare HMO | Attending: Oncology

## 2017-10-09 ENCOUNTER — Inpatient Hospital Stay (HOSPITAL_BASED_OUTPATIENT_CLINIC_OR_DEPARTMENT_OTHER): Payer: Medicare HMO | Admitting: Oncology

## 2017-10-09 ENCOUNTER — Inpatient Hospital Stay: Payer: Medicare HMO

## 2017-10-09 ENCOUNTER — Encounter: Payer: Self-pay | Admitting: *Deleted

## 2017-10-09 ENCOUNTER — Encounter: Payer: Self-pay | Admitting: Oncology

## 2017-10-09 VITALS — BP 122/79 | HR 108 | Temp 98.2°F | Resp 18 | Wt 173.2 lb

## 2017-10-09 DIAGNOSIS — C642 Malignant neoplasm of left kidney, except renal pelvis: Secondary | ICD-10-CM | POA: Diagnosis not present

## 2017-10-09 DIAGNOSIS — Z87891 Personal history of nicotine dependence: Secondary | ICD-10-CM | POA: Insufficient documentation

## 2017-10-09 DIAGNOSIS — I129 Hypertensive chronic kidney disease with stage 1 through stage 4 chronic kidney disease, or unspecified chronic kidney disease: Secondary | ICD-10-CM | POA: Insufficient documentation

## 2017-10-09 DIAGNOSIS — C7802 Secondary malignant neoplasm of left lung: Principal | ICD-10-CM

## 2017-10-09 DIAGNOSIS — K7460 Unspecified cirrhosis of liver: Secondary | ICD-10-CM | POA: Diagnosis not present

## 2017-10-09 DIAGNOSIS — E871 Hypo-osmolality and hyponatremia: Secondary | ICD-10-CM | POA: Insufficient documentation

## 2017-10-09 DIAGNOSIS — M109 Gout, unspecified: Secondary | ICD-10-CM | POA: Insufficient documentation

## 2017-10-09 DIAGNOSIS — Z905 Acquired absence of kidney: Secondary | ICD-10-CM

## 2017-10-09 DIAGNOSIS — R188 Other ascites: Secondary | ICD-10-CM | POA: Diagnosis not present

## 2017-10-09 DIAGNOSIS — E875 Hyperkalemia: Secondary | ICD-10-CM | POA: Insufficient documentation

## 2017-10-09 DIAGNOSIS — Z7982 Long term (current) use of aspirin: Secondary | ICD-10-CM | POA: Diagnosis not present

## 2017-10-09 DIAGNOSIS — C7801 Secondary malignant neoplasm of right lung: Secondary | ICD-10-CM | POA: Insufficient documentation

## 2017-10-09 DIAGNOSIS — Z794 Long term (current) use of insulin: Secondary | ICD-10-CM | POA: Diagnosis not present

## 2017-10-09 DIAGNOSIS — J90 Pleural effusion, not elsewhere classified: Secondary | ICD-10-CM | POA: Insufficient documentation

## 2017-10-09 DIAGNOSIS — Z5112 Encounter for antineoplastic immunotherapy: Secondary | ICD-10-CM | POA: Insufficient documentation

## 2017-10-09 DIAGNOSIS — K219 Gastro-esophageal reflux disease without esophagitis: Secondary | ICD-10-CM | POA: Diagnosis not present

## 2017-10-09 DIAGNOSIS — D61818 Other pancytopenia: Secondary | ICD-10-CM

## 2017-10-09 DIAGNOSIS — Z79899 Other long term (current) drug therapy: Secondary | ICD-10-CM | POA: Diagnosis not present

## 2017-10-09 DIAGNOSIS — C649 Malignant neoplasm of unspecified kidney, except renal pelvis: Secondary | ICD-10-CM

## 2017-10-09 DIAGNOSIS — C7800 Secondary malignant neoplasm of unspecified lung: Secondary | ICD-10-CM

## 2017-10-09 DIAGNOSIS — E1122 Type 2 diabetes mellitus with diabetic chronic kidney disease: Secondary | ICD-10-CM | POA: Diagnosis not present

## 2017-10-09 DIAGNOSIS — N183 Chronic kidney disease, stage 3 (moderate): Secondary | ICD-10-CM | POA: Insufficient documentation

## 2017-10-09 LAB — CBC WITH DIFFERENTIAL/PLATELET
Abs Immature Granulocytes: 0.01 10*3/uL (ref 0.00–0.07)
Basophils Absolute: 0 10*3/uL (ref 0.0–0.1)
Basophils Relative: 0 %
EOS ABS: 0.1 10*3/uL (ref 0.0–0.5)
EOS PCT: 2 %
HEMATOCRIT: 32 % — AB (ref 39.0–52.0)
HEMOGLOBIN: 10.6 g/dL — AB (ref 13.0–17.0)
Immature Granulocytes: 0 %
LYMPHS ABS: 0.5 10*3/uL — AB (ref 0.7–4.0)
LYMPHS PCT: 10 %
MCH: 27.7 pg (ref 26.0–34.0)
MCHC: 33.1 g/dL (ref 30.0–36.0)
MCV: 83.6 fL (ref 80.0–100.0)
MONO ABS: 0.6 10*3/uL (ref 0.1–1.0)
MONOS PCT: 11 %
NRBC: 0 % (ref 0.0–0.2)
Neutro Abs: 3.9 10*3/uL (ref 1.7–7.7)
Neutrophils Relative %: 77 %
Platelets: 134 10*3/uL — ABNORMAL LOW (ref 150–400)
RBC: 3.83 MIL/uL — ABNORMAL LOW (ref 4.22–5.81)
RDW: 16.3 % — AB (ref 11.5–15.5)
WBC: 5.2 10*3/uL (ref 4.0–10.5)

## 2017-10-09 LAB — COMPREHENSIVE METABOLIC PANEL
ALK PHOS: 343 U/L — AB (ref 38–126)
ALT: 18 U/L (ref 0–44)
ANION GAP: 7 (ref 5–15)
AST: 46 U/L — ABNORMAL HIGH (ref 15–41)
Albumin: 2.2 g/dL — ABNORMAL LOW (ref 3.5–5.0)
BILIRUBIN TOTAL: 2.1 mg/dL — AB (ref 0.3–1.2)
BUN: 31 mg/dL — ABNORMAL HIGH (ref 8–23)
CO2: 28 mmol/L (ref 22–32)
Calcium: 8 mg/dL — ABNORMAL LOW (ref 8.9–10.3)
Chloride: 85 mmol/L — ABNORMAL LOW (ref 98–111)
Creatinine, Ser: 1.46 mg/dL — ABNORMAL HIGH (ref 0.61–1.24)
GFR calc Af Amer: 58 mL/min — ABNORMAL LOW (ref >60)
GFR calc non Af Amer: 50 mL/min — ABNORMAL LOW (ref >60)
GLUCOSE: 86 mg/dL (ref 70–99)
POTASSIUM: 5.3 mmol/L — AB (ref 3.5–5.1)
Sodium: 120 mmol/L — ABNORMAL LOW (ref 135–145)
TOTAL PROTEIN: 7 g/dL (ref 6.5–8.1)

## 2017-10-09 MED ORDER — SODIUM CHLORIDE 0.9 % IV SOLN
Freq: Once | INTRAVENOUS | Status: AC
  Administered 2017-10-09: 11:00:00 via INTRAVENOUS
  Filled 2017-10-09: qty 250

## 2017-10-09 MED ORDER — SODIUM CHLORIDE 0.9 % IV SOLN
240.0000 mg | Freq: Once | INTRAVENOUS | Status: AC
  Administered 2017-10-09: 240 mg via INTRAVENOUS
  Filled 2017-10-09: qty 24

## 2017-10-09 NOTE — Progress Notes (Addendum)
Patients potassium 5.3 today, per Dr. Grayland Ormond patient to restart Lasix at 20 mg daily. RX sent to patients pharmacy.

## 2017-10-09 NOTE — Progress Notes (Signed)
Pt in for follow up, reports still having issue with left knee pain.

## 2017-10-10 LAB — THYROID PANEL WITH TSH
FREE THYROXINE INDEX: 2.1 (ref 1.2–4.9)
T3 UPTAKE RATIO: 34 % (ref 24–39)
T4, Total: 6.1 ug/dL (ref 4.5–12.0)
TSH: 19.57 u[IU]/mL — AB (ref 0.450–4.500)

## 2017-10-11 ENCOUNTER — Telehealth: Payer: Self-pay

## 2017-10-11 NOTE — Telephone Encounter (Signed)
-----   Message from Jonathon Bellows, MD sent at 10/04/2017 10:56 AM EDT ----- Regarding: RE: Pt requst for advice  Await his appointment for TIPS, very minimal sodium in the diet     ----- Message ----- From: Rushie Chestnut, [place]: 10/04/2017  10:31 AM EDT To: Jonathon Bellows, MD Subject: Pt requst for advice                           Dr. Vicente Males,  Mr. Schweigert called and stated he visited the urologist and was told he could not have hydrocele drained due to the liver ascites. Pt is requesting advice on what to do next. It looks like he was seen by his pcp on 10-03-17 for this issue.   Sherald Hess

## 2017-10-11 NOTE — Telephone Encounter (Signed)
Spoke with Rodney Stewart and informed him of Dr. Georgeann Oppenheim instructions to await TIPS procedure appointment and stay on a low sodium diet. Rodney Stewart states he feels okay and he is going for his TIPS appointment on 10-15-17.

## 2017-10-15 ENCOUNTER — Encounter: Payer: Self-pay | Admitting: *Deleted

## 2017-10-15 ENCOUNTER — Ambulatory Visit
Admission: RE | Admit: 2017-10-15 | Discharge: 2017-10-15 | Disposition: A | Discharge status: Home / Self Care | Payer: Medicare HMO | Source: Ambulatory Visit | Attending: Gastroenterology | Admitting: Gastroenterology

## 2017-10-15 DIAGNOSIS — Z9221 Personal history of antineoplastic chemotherapy: Secondary | ICD-10-CM | POA: Diagnosis not present

## 2017-10-15 DIAGNOSIS — E871 Hypo-osmolality and hyponatremia: Secondary | ICD-10-CM | POA: Diagnosis not present

## 2017-10-15 DIAGNOSIS — R188 Other ascites: Secondary | ICD-10-CM | POA: Diagnosis not present

## 2017-10-15 DIAGNOSIS — K7031 Alcoholic cirrhosis of liver with ascites: Secondary | ICD-10-CM

## 2017-10-15 DIAGNOSIS — C642 Malignant neoplasm of left kidney, except renal pelvis: Secondary | ICD-10-CM | POA: Diagnosis not present

## 2017-10-15 DIAGNOSIS — K7460 Unspecified cirrhosis of liver: Secondary | ICD-10-CM | POA: Diagnosis not present

## 2017-10-15 HISTORY — PX: IR RADIOLOGIST EVAL & MGMT: IMG5224

## 2017-10-15 NOTE — Consult Note (Signed)
Chief Complaint:  Cirrhosis, ascites, abdominal distention.  Assess for TI PS  Referring Physician(s): Anna,Kiran  History of Present Illness: Rodney Stewart is a 62 y.o. male with hepatic cirrhosis diagnosed in early 2019.  He has recurrent abdominal ascites requiring large-volume paracenteses.  He also has a complicated history with stage IV renal cell carcinoma and bilateral lung metastases.  He has chronic kidney disease stage III and diabetes.  He has had a previous left nephrectomy.  He remains on chemotherapy and is followed by Dr. Grayland Ormond from an oncology standpoint.  He has a significant prior drinking history.  Hepatitis serologies are negative.  EGD in April 2019 did not demonstrate any significant varices.  He is here today to be assessed for the TIPS procedure.  Past Medical History:  Diagnosis Date  . Anemia   . BPH (benign prostatic hyperplasia)   . CKD (chronic kidney disease) stage 3, GFR 30-59 ml/min (HCC)   . Clear cell renal cell carcinoma ([place]) 2014   Left Nephrectomy.  . Colon polyps   . Diabetes mellitus without complication ([place])    type 2   . Dyspnea    with exertion  . GERD (gastroesophageal reflux disease)   . History of gout   . History of nephrectomy    Left  . Hypertension   . Neuropathy   . Renal Cancer    Renal Cancer  . Renal cell carcinoma of left kidney (HCC)    mets to lungs  . Umbilical hernia     Past Surgical History:  Procedure Laterality Date  . BACK SURGERY     ruptured disc 1991   . ENDOBRONCHIAL ULTRASOUND N/A 10/14/2015   Procedure: ENDOBRONCHIAL ULTRASOUND;  Surgeon: [name], MD;  Location: [facility] ORS;  Service: Pulmonary;  Laterality: N/A;  . ESOPHAGOGASTRODUODENOSCOPY (EGD) WITH PROPOFOL N/A 04/15/2017   Procedure: ESOPHAGOGASTRODUODENOSCOPY (EGD) WITH PROPOFOL;  Surgeon: Jonathon Bellows, MD;  Location[facility] ENDOSCOPY;  Service: Gastroenterology;  Laterality: N/A;  Screen for esophageal varices  . EYE SURGERY  Bilateral    Cataract Extraction with IOL  . IR RADIOLOGIST EVAL & MGMT  10/15/2017  . KNEE SURGERY Right   . NEPHRECTOMY     left kidney 2014 renal cell cancer     Allergies: Patient has no known allergies.  Medications: Prior to Admission medications   Medication Sig Start Date End Date Taking? Authorizing Provider  allopurinol (ZYLOPRIM) 100 MG tablet Take 1 tablet (100 mg total) by mouth daily. 08/27/17   [name], [name], MD  ALPRAZolam Duanne Moron) 1 MG tablet Take 1 mg by mouth as needed for sleep. 06/10/16   [provider]  aspirin EC 81 MG tablet Take 81 mg by mouth daily.    [provider]  feeding supplement, ENSURE ENLIVE, (ENSURE ENLIVE) LIQD Take 237 mLs by mouth 3 (three) times daily between meals. 09/18/17   Bettey Costa, MD  furosemide (LASIX) 20 MG tablet Take 1 tablet (20 mg total) by mouth daily. In am Patient not taking: Reported on 10/09/2017 10/03/17   [name], [name], MD  gabapentin (NEURONTIN) 100 MG capsule Take 2 capsules (200 mg total) by mouth 3 (three) times daily. Patient taking differently: Take 200 mg by mouth 2 (two) times daily.  04/26/17   [name], [name], MD  HYDROcodone-acetaminophen (NORCO) 10-325 MG tablet TK 1 T PO BID PRN 09/27/17   [provider]  insulin aspart (NOVOLOG) 100 UNIT/ML FlexPen Before meals 131-180 2  units, 181-240 4 units, 241-300 6 units, 301-350 8 units, 351-400 10 units, >400 12 units 09/30/17   [name], [name], MD  Insulin Detemir (LEVEMIR FLEXTOUCH) 100 UNIT/ML Pen Inject 40 Units into the skin daily at 10 pm. 05/28/17   [name], [name], MD  Insulin Pen Needle 32G X 4 MM MISC 1 Device by Does not apply route 2 (two) times daily. E11.9 09/23/17   [name], [name], MD  levothyroxine (SYNTHROID, LEVOTHROID) 25 MCG tablet Take 1 tablet (25 mcg total) by mouth daily before breakfast. 30 minutes 10/03/17   [name], [name], MD  lovastatin (MEVACOR) 20 MG tablet  Take 1 tablet (20 mg total) by mouth daily at 6 PM. 08/08/17   [name], [name], MD  magic mouthwash w/lidocaine SOLN Take 5 mLs by mouth 3 (three) times daily as needed for mouth pain. Swish and spit 09/23/17   [name], [name], MD  ONE TOUCH ULTRA TEST test strip Bid 05/28/17   [name], [name], MD  oxyCODONE (ROXICODONE) 5 MG immediate release tablet Take 1 tablet (5 mg total) by mouth every 6 (six) hours as needed for severe pain or breakthrough pain. 09/25/17   Jacquelin Hawking, NP  tamsulosin (FLOMAX) 0.4 MG CAPS capsule Take 1 capsule (0.4 mg total) by mouth every other day. With supper 10/07/17   [name], [name], MD  valACYclovir (VALTREX) 1000 MG tablet Take 1 tablet (1,000 mg total) by mouth 2 (two) times daily. X 5-10 days with food 09/23/17   [name], [name], MD     Family History  Problem Relation Age of Onset  . Ovarian cancer Mother   . Diabetes Father   . Hypertension Father     Social History   Socioeconomic History  . Marital status: Divorced    Spouse name: Not on file  . Number of children: 2  . Years of education: Not on file  . Highest education level: Not on file  Occupational History  . Not on file  Social Needs  . Financial resource strain: Not hard at all  . Food insecurity:    Worry: Never true    Inability: Never true  . Transportation needs:    Medical: No    Non-medical: No  Tobacco Use  . Smoking status: Former Smoker    Packs/day: 1.00    Types: Cigarettes    Last attempt to quit: 04/15/1993    Years since quitting: 24.5  . Smokeless tobacco: Never Used  Substance and Sexual Activity  . Alcohol use: Not Currently  . Drug use: No  . Sexual activity: Yes  Lifestyle  . Physical activity:    Days per week: Patient refused    Minutes per session: Patient refused  . Stress: Patient refused  Relationships  . Social connections:    Talks on phone: Patient refused    Gets together: Patient refused     Attends religious service: Patient refused    Active member of club or organization: Patient refused    Attends meetings of clubs or organizations: Patient refused    Relationship status: Patient refused  Other Topics Concern  . Not on file  Social History Narrative   HS graduate    Retired    Divorced    2 daughters    Lives alone    [name] to travel to [employer]    1 living brother who is doctor in [place], wears seat belt  ECOG Status: 2 - Symptomatic, <50% confined to bed  Review of Systems: A 12 point ROS discussed and pertinent positives are indicated in the HPI above.  All other systems are negative.  Review of Systems  Vital Signs: BP 103/67   Pulse 92   Temp 98.2 F (36.8 C) (Oral)   Resp 17   Ht 5\' 11"  (1.803 m)   Wt 80.3 kg   SpO2 97%   BMI 24.69 kg/m   Physical Exam  Constitutional: He is oriented to person, place, and time.  Ill-appearing elderly male with obvious ascites but no distress.  Eyes: Conjunctivae are normal. No scleral icterus.  Cardiovascular: Normal rate and regular rhythm.  Pulmonary/Chest: Effort normal and breath sounds normal.  Abdominal: Bowel sounds are normal. He exhibits distension. There is no tenderness.  Protuberant distended abdomen secondary to ascites.  Obvious midline umbilical hernia which is reducible.  He has a left inguinal scrotal large hernia.  Musculoskeletal: He exhibits edema.  Neurological: He is alert and oriented to person, place, and time.  No signs of encephalopathy     Imaging: Ct Abdomen Pelvis W Contrast  Result Date: 09/20/2017 CLINICAL DATA:  Bilateral lower abdominal pain radiating across the lower abdomen. Patient was released from hospital Wednesday after paracentesis. Patient has had umbilical hernia for years. EXAM: CT ABDOMEN AND PELVIS WITH CONTRAST TECHNIQUE: Multidetector CT imaging of the abdomen and pelvis was performed using the standard protocol following bolus administration  of intravenous contrast. CONTRAST:  145mL ISOVUE-300 IOPAMIDOL (ISOVUE-300) INJECTION 61% COMPARISON:  09/13/2017 CT FINDINGS: Lower chest: Normal heart size without pericardial effusion. Redemonstration of dominant pulmonary mass in the left lower lobe measuring approximately 2.7 x 2.4 cm allowing for slice sampling differences in operator dependent imaging. Smaller scattered pulmonary nodules are also present within both lower lobes. Trace right greater than left pleural effusions. Hepatobiliary: Cirrhotic appearance of the liver with heterogeneous somewhat wedge-shaped area of hypo density involving the medial segment of left hepatic lobe but without definable mass. Nondistended gallbladder with layering gallstones. No biliary dilatation. Pancreas: Unremarkable Spleen: Enlarged up to 16.5 cm without focal mass stable since recent. Adrenals/Urinary Tract: Surgically absent left adrenal gland and kidney. The right adrenal gland and right kidney are unremarkable. Stomach/Bowel: The stomach is decompressed in appearance. The duodenal sweep and ligament of Treitz are normal in position. No small bowel dilatation or obstruction is identified. Mildly edematous appearance of the small bowel likely sympathetic from surrounding ascites. Likewise there is somewhat thickened appearance of the cecum and ascending colon since prior with submucosal fatty change. Possibility of a colitis is not entirely excluded. Centralized loops of small bowel secondary to large volume of ascites. No bowel obstruction. Vascular/Lymphatic: Nonaneurysmal thoracic aorta with atherosclerosis. No adenopathy. Multiple upper abdominal varices. Reproductive: Mildly enlarged prostate. Other: Ventral hernia containing loculated fluid is again redemonstrated without definite herniation of bowel given lack of more proximal bowel this measures 10.6 x 9.6 x 2.7 cm (transverse by CC by AP dimension) with the mouth of hernia measuring 4.2 x 1.7 cm in  transverse by craniocaudad. Previously this hernia was estimated at 9.5 cm transverse by 2.7 cm in thickness. Prominent enhancing omental vessels project into the hernia as before. Moderate to large volume of ascites redemonstrated. Musculoskeletal: Degenerative disc disease L3-4 and L4-5. IMPRESSION: 1. Moderate to large volume of ascites despite recent paracentesis suggests reaccumulation of fluid given similar appearance of the abdomen and pelvis 09/13/2017 (patient had a paracentesis performed on 09/16/2017). 2. Cirrhotic liver  with splenomegaly and stigmata of portal hypertension. 3. Slightly wedge shaped perfusion anomaly of the medial segment of left hepatic lobe similar to prior. A mass cannot be entirely excluded. 4. Stable pulmonary nodules with dominant mass in left lower lobe. 5. Large multiloculated fluid containing ventral hernia with herniation of omental vessels as before currently estimated at 10.6 x 9.6 x 2.7 cm. This appears more prominent than on prior. Electronically Signed   By: Ashley Royalty M.D.   On: 09/20/2017 21:01   US Paracentesis  Result Date: 09/16/2017 CLINICAL DATA:  Cirrhosis, recurrent large volume abdominal ascites. He had some skin breakdown of his umbilical hernia resulting in leakage of a fair amount of peritoneal fluid while at home. EXAM: ULTRASOUND GUIDED PARACENTESIS TECHNIQUE: The procedure, risks (including but not limited to bleeding, infection, organ damage ), benefits, and alternatives were explained to the patient. Questions regarding the procedure were encouraged and answered. The patient understands and consents to the procedure. Survey ultrasound of the abdomen was performed and an appropriate skin entry site in the lateral right lower abdomen was selected. Skin site was marked, prepped with chlorhexadine, draped in usual sterile fashion, and infiltrated locally with 1% lidocaine. A Safe-T-Centesis needle was advanced into the peritoneal space until fluid could  be aspirated. The sheath was advanced and the needle removed. 2 L of clear yellow ascites were aspirated. The patient tolerated the procedure well. COMPLICATIONS: COMPLICATIONS none IMPRESSION: Technically successful ultrasound guided paracentesis, removing 2 L ascites. Electronically Signed   By: Lucrezia Europe M.D.   On: 09/16/2017 12:54   US Scrotum W/doppler  Result Date: 09/25/2017 CLINICAL DATA:  Scrotal swelling EXAM: SCROTAL ULTRASOUND DOPPLER ULTRASOUND OF THE TESTICLES TECHNIQUE: Complete ultrasound examination of the testicles, epididymis, and other scrotal structures was performed. Color and spectral Doppler ultrasound were also utilized to evaluate blood flow to the testicles. COMPARISON:  None. FINDINGS: Right testicle Measurements: 4.1 x 2.6 x 2.7 cm. No mass or microlithiasis visualized. Left testicle Measurements: 4.1 x 2.7 x 2.8 cm. No mass or microlithiasis visualized. Right epididymis:  Normal in size and appearance. Left epididymis:  1.1 cm epididymal head cyst or spermatocele. Hydrocele:  Large bilateral hydroceles Varicocele:  None visualized. Pulsed Doppler interrogation of both testes demonstrates normal low resistance arterial and venous waveforms bilaterally. Other: Complex fluid collections are noted in the inguinal canal regions bilaterally with numerous internal septations. These may be related to hernias containing loculated fluid. IMPRESSION: No focal testicular abnormality or evidence of testicular torsion. Large bilateral hydroceles. Complex fluid-filled areas within the inguinal canals bilaterally, likely loculated ascites related to the large volume abdominal ascites seen on prior CT. Electronically Signed   By: Rolm Baptise M.D.   On: 09/25/2017 13:23   Ir Radiologist Eval & Mgmt  Result Date: 10/15/2017 Please refer to notes tab for details about interventional procedure. (Op Note)   Labs:  CBC: Recent Labs    09/17/17 0331 09/20/17 1728 09/25/17 0855  10/09/17 0922  WBC 3.8 7.6 4.7 5.2  HGB 9.6* 12.7* 10.6* 10.6*  HCT 28.3* 37.5* 31.4* 32.0*  PLT 68* 125* 176 134*    COAGS: Recent Labs    09/13/17 2144 09/23/17 1336  INR 1.15 1.2    BMP: Recent Labs    09/23/17 1336 09/24/17 1458 09/25/17 0855 10/09/17 0922  NA 122* 121* 120* 120*  K 5.5* 5.5* 5.1 5.3*  CL 85* 85* 86* 85*  CO2 22 23 26 28   GLUCOSE 139* 133* 303* 86  BUN  39* 37* 38* 31*  CALCIUM 7.9* 8.1* 8.1* 8.0*  CREATININE 1.51* 1.35* 1.48* 1.46*  GFRNONAA 49* 56* 49* 50*  GFRAA 57* 65 57* 58*    LIVER FUNCTION TESTS: Recent Labs    09/20/17 1728 09/23/17 1336 09/25/17 0855 10/09/17 0922  BILITOT 3.9* 3.9* 2.9* 2.1*  AST 37 34 29 46*  ALT 32 22 19 18   ALKPHOS 391* 430* 347* 343*  PROT 5.8* 5.6* 6.1* 7.0  ALBUMIN 2.6* 2.8* 2.3* 2.2*     Assessment and Plan:  Advanced hepatic cirrhosis with refractory abdominal ascites requiring large-volume paracenteses.  He has a complicated history also including hyponatremia, protein caloric malnutrition, hypovolemia, and chronic renal insufficiency.  Calculated sodium corrected meld score is 25.  This meld score is high because of his profound hyponatremia (sodium 120).  For example, if his sodium was corrected to 135 his meld score would be 17 and he would be a candidate for the TIPS procedure.  At this point,  he would be high risk for mortality at 3 months after a TIPS procedure.  Plan: Work closely with GI and nephrology to correct his hyponatremia.  If they are able to get a sodium of 135 which is low normal his meld score would be acceptable for TIPS procedure.  Findings discussed with Dr. Vicente Males at [facility].  If sodium correction is achievable, he will return the patient for reevaluation.  Thank you for this interesting consult.  I greatly enjoyed meeting Benz B Oesterle and look forward to participating in their care.  A copy of this report was sent to the requesting provider on this date.  Electronically  Signed: Greggory Keen 10/15/2017, 3:28 PM   I spent a total of  40 Minutes   in face to face in clinical consultation, greater than 50% of which was counseling/coordinating care for with advanced liver disease and refractory ascites.

## 2017-10-16 ENCOUNTER — Inpatient Hospital Stay: Payer: Medicare HMO

## 2017-10-16 ENCOUNTER — Other Ambulatory Visit: Payer: Self-pay

## 2017-10-18 ENCOUNTER — Other Ambulatory Visit: Payer: Self-pay

## 2017-10-18 ENCOUNTER — Emergency Department: Payer: Medicare HMO

## 2017-10-18 ENCOUNTER — Encounter: Payer: Self-pay | Admitting: Emergency Medicine

## 2017-10-18 ENCOUNTER — Inpatient Hospital Stay
Admission: EM | Admit: 2017-10-18 | Discharge: 2017-10-22 | DRG: 442 | Disposition: A | Discharge status: Home Health | Payer: Medicare HMO | Attending: Internal Medicine | Admitting: Internal Medicine

## 2017-10-18 DIAGNOSIS — Z7982 Long term (current) use of aspirin: Secondary | ICD-10-CM

## 2017-10-18 DIAGNOSIS — Z85528 Personal history of other malignant neoplasm of kidney: Secondary | ICD-10-CM

## 2017-10-18 DIAGNOSIS — E8809 Other disorders of plasma-protein metabolism, not elsewhere classified: Secondary | ICD-10-CM | POA: Diagnosis present

## 2017-10-18 DIAGNOSIS — R0602 Shortness of breath: Secondary | ICD-10-CM | POA: Diagnosis not present

## 2017-10-18 DIAGNOSIS — R809 Proteinuria, unspecified: Secondary | ICD-10-CM | POA: Diagnosis not present

## 2017-10-18 DIAGNOSIS — Z8719 Personal history of other diseases of the digestive system: Secondary | ICD-10-CM

## 2017-10-18 DIAGNOSIS — T500X5A Adverse effect of mineralocorticoids and their antagonists, initial encounter: Secondary | ICD-10-CM | POA: Diagnosis present

## 2017-10-18 DIAGNOSIS — D6959 Other secondary thrombocytopenia: Secondary | ICD-10-CM | POA: Diagnosis present

## 2017-10-18 DIAGNOSIS — Z905 Acquired absence of kidney: Secondary | ICD-10-CM

## 2017-10-18 DIAGNOSIS — Z8249 Family history of ischemic heart disease and other diseases of the circulatory system: Secondary | ICD-10-CM

## 2017-10-18 DIAGNOSIS — Z794 Long term (current) use of insulin: Secondary | ICD-10-CM

## 2017-10-18 DIAGNOSIS — N400 Enlarged prostate without lower urinary tract symptoms: Secondary | ICD-10-CM | POA: Diagnosis present

## 2017-10-18 DIAGNOSIS — I2510 Atherosclerotic heart disease of native coronary artery without angina pectoris: Secondary | ICD-10-CM | POA: Diagnosis present

## 2017-10-18 DIAGNOSIS — E871 Hypo-osmolality and hyponatremia: Secondary | ICD-10-CM | POA: Diagnosis not present

## 2017-10-18 DIAGNOSIS — Z66 Do not resuscitate: Secondary | ICD-10-CM | POA: Diagnosis present

## 2017-10-18 DIAGNOSIS — K7290 Hepatic failure, unspecified without coma: Secondary | ICD-10-CM | POA: Diagnosis present

## 2017-10-18 DIAGNOSIS — C7800 Secondary malignant neoplasm of unspecified lung: Secondary | ICD-10-CM | POA: Diagnosis not present

## 2017-10-18 DIAGNOSIS — K7460 Unspecified cirrhosis of liver: Secondary | ICD-10-CM | POA: Diagnosis not present

## 2017-10-18 DIAGNOSIS — N5089 Other specified disorders of the male genital organs: Secondary | ICD-10-CM | POA: Diagnosis present

## 2017-10-18 DIAGNOSIS — D631 Anemia in chronic kidney disease: Secondary | ICD-10-CM | POA: Diagnosis present

## 2017-10-18 DIAGNOSIS — R1084 Generalized abdominal pain: Secondary | ICD-10-CM

## 2017-10-18 DIAGNOSIS — R140 Abdominal distension (gaseous): Secondary | ICD-10-CM

## 2017-10-18 DIAGNOSIS — Z79899 Other long term (current) drug therapy: Secondary | ICD-10-CM

## 2017-10-18 DIAGNOSIS — Z87891 Personal history of nicotine dependence: Secondary | ICD-10-CM

## 2017-10-18 DIAGNOSIS — Z9841 Cataract extraction status, right eye: Secondary | ICD-10-CM

## 2017-10-18 DIAGNOSIS — E1140 Type 2 diabetes mellitus with diabetic neuropathy, unspecified: Secondary | ICD-10-CM | POA: Diagnosis present

## 2017-10-18 DIAGNOSIS — R188 Other ascites: Secondary | ICD-10-CM | POA: Diagnosis present

## 2017-10-18 DIAGNOSIS — E875 Hyperkalemia: Secondary | ICD-10-CM

## 2017-10-18 DIAGNOSIS — K767 Hepatorenal syndrome: Secondary | ICD-10-CM

## 2017-10-18 DIAGNOSIS — E119 Type 2 diabetes mellitus without complications: Secondary | ICD-10-CM | POA: Diagnosis not present

## 2017-10-18 DIAGNOSIS — E039 Hypothyroidism, unspecified: Secondary | ICD-10-CM | POA: Diagnosis not present

## 2017-10-18 DIAGNOSIS — K219 Gastro-esophageal reflux disease without esophagitis: Secondary | ICD-10-CM | POA: Diagnosis present

## 2017-10-18 DIAGNOSIS — E8770 Fluid overload, unspecified: Secondary | ICD-10-CM | POA: Diagnosis present

## 2017-10-18 DIAGNOSIS — E1122 Type 2 diabetes mellitus with diabetic chronic kidney disease: Secondary | ICD-10-CM | POA: Diagnosis present

## 2017-10-18 DIAGNOSIS — E861 Hypovolemia: Secondary | ICD-10-CM | POA: Diagnosis present

## 2017-10-18 DIAGNOSIS — N183 Chronic kidney disease, stage 3 unspecified: Secondary | ICD-10-CM | POA: Diagnosis present

## 2017-10-18 DIAGNOSIS — E1142 Type 2 diabetes mellitus with diabetic polyneuropathy: Secondary | ICD-10-CM

## 2017-10-18 DIAGNOSIS — Z961 Presence of intraocular lens: Secondary | ICD-10-CM | POA: Diagnosis present

## 2017-10-18 DIAGNOSIS — Z9842 Cataract extraction status, left eye: Secondary | ICD-10-CM

## 2017-10-18 DIAGNOSIS — M109 Gout, unspecified: Secondary | ICD-10-CM | POA: Diagnosis present

## 2017-10-18 DIAGNOSIS — R600 Localized edema: Secondary | ICD-10-CM

## 2017-10-18 HISTORY — DX: Unspecified cirrhosis of liver: K74.60

## 2017-10-18 HISTORY — DX: Other ascites: R18.8

## 2017-10-18 LAB — COMPREHENSIVE METABOLIC PANEL
ALBUMIN: 2 g/dL — AB (ref 3.5–5.0)
ALT: 14 U/L (ref 0–44)
ANION GAP: 11 (ref 5–15)
AST: 31 U/L (ref 15–41)
Alkaline Phosphatase: 289 U/L — ABNORMAL HIGH (ref 38–126)
BUN: 19 mg/dL (ref 8–23)
CO2: 24 mmol/L (ref 22–32)
Calcium: 7.7 mg/dL — ABNORMAL LOW (ref 8.9–10.3)
Chloride: 86 mmol/L — ABNORMAL LOW (ref 98–111)
Creatinine, Ser: 1.43 mg/dL — ABNORMAL HIGH (ref 0.61–1.24)
GFR calc non Af Amer: 51 mL/min — ABNORMAL LOW (ref >60)
GFR, EST AFRICAN AMERICAN: 60 mL/min — AB (ref >60)
GLUCOSE: 295 mg/dL — AB (ref 70–99)
POTASSIUM: 5.6 mmol/L — AB (ref 3.5–5.1)
SODIUM: 121 mmol/L — AB (ref 135–145)
Total Bilirubin: 3.7 mg/dL — ABNORMAL HIGH (ref 0.3–1.2)
Total Protein: 6.7 g/dL (ref 6.5–8.1)

## 2017-10-18 LAB — CBC
HCT: 33.9 % — ABNORMAL LOW (ref 39.0–52.0)
HEMOGLOBIN: 11.5 g/dL — AB (ref 13.0–17.0)
MCH: 28.8 pg (ref 26.0–34.0)
MCHC: 33.9 g/dL (ref 30.0–36.0)
MCV: 84.8 fL (ref 80.0–100.0)
NRBC: 0 % (ref 0.0–0.2)
Platelets: 145 10*3/uL — ABNORMAL LOW (ref 150–400)
RBC: 4 MIL/uL — AB (ref 4.22–5.81)
RDW: 18.2 % — ABNORMAL HIGH (ref 11.5–15.5)
WBC: 6.1 10*3/uL (ref 4.0–10.5)

## 2017-10-18 LAB — LIPASE, BLOOD: Lipase: 31 U/L (ref 11–51)

## 2017-10-18 LAB — PROTIME-INR
INR: 1.45
PROTHROMBIN TIME: 17.5 s — AB (ref 11.4–15.2)

## 2017-10-18 LAB — AMMONIA: AMMONIA: 17 umol/L (ref 9–35)

## 2017-10-18 MED ORDER — FUROSEMIDE 10 MG/ML IJ SOLN
40.0000 mg | Freq: Once | INTRAMUSCULAR | Status: AC
  Administered 2017-10-18: 40 mg via INTRAVENOUS
  Filled 2017-10-18: qty 4

## 2017-10-18 MED ORDER — SODIUM CHLORIDE 0.9 % IV BOLUS
500.0000 mL | Freq: Once | INTRAVENOUS | Status: AC
  Administered 2017-10-18: 500 mL via INTRAVENOUS

## 2017-10-18 MED ORDER — MORPHINE SULFATE (PF) 4 MG/ML IV SOLN
4.0000 mg | INTRAVENOUS | Status: DC | PRN
  Administered 2017-10-18 (×2): 4 mg via INTRAVENOUS
  Filled 2017-10-18 (×2): qty 1

## 2017-10-18 MED ORDER — FUROSEMIDE 10 MG/ML IJ SOLN: 60.0000 mg | Freq: Once | INTRAMUSCULAR | Status: DC

## 2017-10-18 MED ORDER — ALBUMIN HUMAN 25 % IV SOLN
12.5000 g | Freq: Once | INTRAVENOUS | Status: AC
  Administered 2017-10-18: 12.5 g via INTRAVENOUS
  Filled 2017-10-18 (×2): qty 50

## 2017-10-18 MED ORDER — PROMETHAZINE HCL 25 MG/ML IJ SOLN
12.5000 mg | Freq: Four times a day (QID) | INTRAMUSCULAR | Status: DC | PRN
  Administered 2017-10-18 (×2): 12.5 mg via INTRAVENOUS
  Filled 2017-10-18 (×2): qty 1

## 2017-10-18 NOTE — ED Notes (Signed)
Ann RN, aware of bed assigned

## 2017-10-18 NOTE — H&P (Signed)
[facility] [place] NAME: Rodney Stewart    MR#:  [id number]  DATE OF BIRTH:  May 13, 1955  DATE OF ADMISSION:  10/18/2017  PRIMARY CARE PHYSICIAN: [name], [name], MD   REQUESTING/REFERRING PHYSICIAN: Quentin Cornwall, MD  CHIEF COMPLAINT:   Chief Complaint  Patient presents with  . Nausea    HISTORY OF PRESENT ILLNESS:  Rodney Stewart  is a 62 y.o. male who presents with chief complaint as above.  Patient presents for persistent malaise, weakness, chronic pain.  He has cirrhosis and significant edema.  He has persistent hyponatremia which is prevented him from getting TIPS procedure.  Over the last several days he has had increased edema including scrotal swelling.  He states that this is very painful.  On evaluation in the ED his albumin is 2, his sodium is mid 120s, which is about where it has been for the last several months.  Nephrology was contacted by ED physician and recommended admitting the patient with albumin and Lasix, and they will follow with him in the morning.  Hospitalist were called for admission  PAST MEDICAL HISTORY:   Past Medical History:  Diagnosis Date  . Anemia   . Ascites   . BPH (benign prostatic hyperplasia)   . Cirrhosis ([place])   . CKD (chronic kidney disease) stage 3, GFR 30-59 ml/min (HCC)   . Clear cell renal cell carcinoma ([place]) 2014   Left Nephrectomy.  . Colon polyps   . Diabetes mellitus without complication ([place])    type 2   . Dyspnea    with exertion  . GERD (gastroesophageal reflux disease)   . History of gout   . History of nephrectomy    Left  . Hypertension   . Neuropathy   . Renal Cancer    Renal Cancer  . Renal cell carcinoma of left kidney (HCC)    mets to lungs  . Umbilical hernia      PAST SURGICAL HISTORY:   Past Surgical History:  Procedure Laterality Date  . BACK SURGERY     ruptured disc 1991   . ENDOBRONCHIAL ULTRASOUND N/A 10/14/2015   Procedure: ENDOBRONCHIAL  ULTRASOUND;  Surgeon: [name], MD;  Location: [facility] ORS;  Service: Pulmonary;  Laterality: N/A;  . ESOPHAGOGASTRODUODENOSCOPY (EGD) WITH PROPOFOL N/A 04/15/2017   Procedure: ESOPHAGOGASTRODUODENOSCOPY (EGD) WITH PROPOFOL;  Surgeon: Jonathon Bellows, MD;  Location: [facility] ENDOSCOPY;  Service: Gastroenterology;  Laterality: N/A;  Screen for esophageal varices  . EYE SURGERY Bilateral    Cataract Extraction with IOL  . IR RADIOLOGIST EVAL & MGMT  10/15/2017  . KNEE SURGERY Right   . NEPHRECTOMY     left kidney 2014 renal cell cancer      SOCIAL HISTORY:   Social History   Tobacco Use  . Smoking status: Former Smoker    Packs/day: 1.00    Types: Cigarettes    Last attempt to quit: 04/15/1993    Years since quitting: 24.5  . Smokeless tobacco: Never Used  Substance Use Topics  . Alcohol use: Not Currently     FAMILY HISTORY:   Family History  Problem Relation Age of Onset  . Ovarian cancer Mother   . Diabetes Father   . Hypertension Father      DRUG ALLERGIES:  No Known Allergies  MEDICATIONS AT HOME:   Prior to Admission medications   Medication Sig Start Date End Date Taking? Authorizing Provider  allopurinol (ZYLOPRIM) 100 MG tablet Take 1 tablet (  100 mg total) by mouth daily. 08/27/17  Yes [name], [name], MD  ALPRAZolam Duanne Moron) 1 MG tablet Take 1 mg by mouth 2 (two) times daily as needed for anxiety or sleep.  06/10/16  Yes [provider]  aspirin EC 81 MG tablet Take 81 mg by mouth daily.   Yes [provider]  feeding supplement, ENSURE ENLIVE, (ENSURE ENLIVE) LIQD Take 237 mLs by mouth 3 (three) times daily between meals. 09/18/17  Yes Mody, Ulice Bold, MD  furosemide (LASIX) 20 MG tablet Take 1 tablet (20 mg total) by mouth daily. In am 10/03/17  Yes [name], [name], MD  gabapentin (NEURONTIN) 100 MG capsule Take 2 capsules (200 mg total) by mouth 3 (three) times daily. Patient taking differently: Take 200 mg by mouth 2 (two) times  daily.  04/26/17  Yes [name], [name], MD  insulin aspart (NOVOLOG) 100 UNIT/ML FlexPen Before meals 131-180 2 units, 181-240 4 units, 241-300 6 units, 301-350 8 units, 351-400 10 units, >400 12 units 09/30/17  Yes [name], [name], MD  Insulin Detemir (LEVEMIR FLEXTOUCH) 100 UNIT/ML Pen Inject 40 Units into the skin daily at 10 pm. Patient taking differently: Inject 35 Units into the skin daily at 10 pm.  05/28/17  Yes [name], [name], MD  Insulin Pen Needle 32G X 4 MM MISC 1 Device by Does not apply route 2 (two) times daily. E11.9 09/23/17  Yes [name], [name], MD  levothyroxine (SYNTHROID, LEVOTHROID) 25 MCG tablet Take 1 tablet (25 mcg total) by mouth daily before breakfast. 30 minutes 10/03/17  Yes [name], [name], MD  lovastatin (MEVACOR) 20 MG tablet Take 1 tablet (20 mg total) by mouth daily at 6 PM. 08/08/17  Yes [name], [name], MD  ONE TOUCH ULTRA TEST test strip Bid 05/28/17  Yes [name], [name], MD  oxyCODONE (ROXICODONE) 5 MG immediate release tablet Take 1 tablet (5 mg total) by mouth every 6 (six) hours as needed for severe pain or breakthrough pain. 09/25/17  Yes Jacquelin Hawking, NP  tamsulosin (FLOMAX) 0.4 MG CAPS capsule Take 1 capsule (0.4 mg total) by mouth every other day. With supper 10/07/17  Yes [name], [name], MD  valACYclovir (VALTREX) 1000 MG tablet Take 1 tablet (1,000 mg total) by mouth 2 (two) times daily. X 5-10 days with food 09/23/17  Yes [name], [name], MD  magic mouthwash w/lidocaine SOLN Take 5 mLs by mouth 3 (three) times daily as needed for mouth pain. Swish and spit Patient not taking: Reported on 10/18/2017 09/23/17   [name], [name], MD    REVIEW OF SYSTEMS:  Review of Systems  Constitutional: Positive for malaise/fatigue. Negative for chills, fever and weight loss.  HENT: Negative for ear pain, hearing loss and tinnitus.   Eyes: Negative for blurred vision, double vision,  pain and redness.  Respiratory: Negative for cough, hemoptysis and shortness of breath.   Cardiovascular: Negative for chest pain, palpitations, orthopnea and leg swelling.  Gastrointestinal: Positive for abdominal pain, nausea and vomiting. Negative for constipation and diarrhea.  Genitourinary: Negative for dysuria, frequency and hematuria.       Scrotal swelling and pain  Musculoskeletal: Negative for back pain, joint pain and neck pain.  Skin:       No acne, rash, or lesions  Neurological: Negative for dizziness, tremors, focal weakness and weakness.  Endo/Heme/Allergies: Negative for polydipsia. Does not bruise/bleed easily.  Psychiatric/Behavioral: Negative for depression. The patient is not nervous/anxious and does not have insomnia.  VITAL SIGNS:   Vitals:   10/18/17 2030 10/18/17 2036 10/18/17 2100 10/18/17 2130  BP: 109/70  110/75 121/64  Pulse: 97 96 93 93  Temp:      TempSrc:      SpO2: 98% 99% 98% 98%  Weight:      Height:       Wt Readings from Last 3 Encounters:  10/18/17 79.8 kg  10/15/17 80.3 kg  10/09/17 78.6 kg    PHYSICAL EXAMINATION:  Physical Exam  Vitals reviewed. Constitutional: He is oriented to person, place, and time. He appears well-developed and well-nourished. No distress.  HENT:  Head: Normocephalic and atraumatic.  Mouth/Throat: Oropharynx is clear and moist.  Eyes: Pupils are equal, round, and reactive to light. Conjunctivae and EOM are normal. No scleral icterus.  Neck: Normal range of motion. Neck supple. No JVD present. No thyromegaly present.  Cardiovascular: Normal rate, regular rhythm and intact distal pulses. Exam reveals no gallop and no friction rub.  No murmur heard. Respiratory: Effort normal and breath sounds normal. No respiratory distress. He has no wheezes. He has no rales.  GI: Soft. Bowel sounds are normal. He exhibits distension. There is tenderness.  Genitourinary:  Genitourinary Comments: Scrotal swelling   Musculoskeletal: Normal range of motion. He exhibits no edema.  No arthritis, no gout  Lymphadenopathy:    He has no cervical adenopathy.  Neurological: He is alert and oriented to person, place, and time. No cranial nerve deficit.  No dysarthria, no aphasia  Skin: Skin is warm and dry. No rash noted. No erythema.  Psychiatric: He has a normal mood and affect. His behavior is normal. Judgment and thought content normal.    LABORATORY PANEL:   CBC Recent Labs  Lab 10/18/17 1003  WBC 6.1  HGB 11.5*  HCT 33.9*  PLT 145*   ------------------------------------------------------------------------------------------------------------------  Chemistries  Recent Labs  Lab 10/18/17 1003  NA 121*  K 5.6*  CL 86*  CO2 24  GLUCOSE 295*  BUN 19  CREATININE 1.43*  CALCIUM 7.7*  AST 31  ALT 14  ALKPHOS 289*  BILITOT 3.7*   ------------------------------------------------------------------------------------------------------------------  Cardiac Enzymes No results for input(s): TROPONINI in the last 168 hours. ------------------------------------------------------------------------------------------------------------------  RADIOLOGY:  Dg Chest 2 View  Result Date: 10/18/2017 CLINICAL DATA:  Shortness of breath over the past 6 months, worse over the past 2 days. History of metastatic renal cell carcinoma EXAM: CHEST - 2 VIEW COMPARISON:  CT chest dated August 30, 2017. Chest x-ray dated January 25, 2017. FINDINGS: Normal heart size. Fullness of both hilar regions is unchanged. Small bilateral pleural effusions. No consolidation or pneumothorax. No acute osseous abnormality. IMPRESSION: 1. New small bilateral pleural effusions. 2. Unchanged bilateral hilar fullness corresponding to known hilar lymphadenopathy. Electronically Signed   By: Titus Dubin M.D.   On: 10/18/2017 15:24    EKG:   Orders placed or performed during the hospital encounter of 10/18/17  . ED EKG  . ED  EKG    IMPRESSION AND PLAN:  Principal Problem:   Cirrhosis ([place]) -avoid hepatotoxins, patient likely has hepatorenal syndrome contributing to his significant fluid overload.  His hypoalbuminemia is causing his fluid to third space.  Treat these problems as below.  He has been told that if he can get his sodium up to normal level he can likely proceed with TI PS procedure.  If his sodium is able to correct, then we would consider GI consult at that point Active Problems:   Hyponatremia -IV albumin  with Lasix for now to see if we can push some of his fluid to his intravascular space and diurese it out.  Monitor sodium levels every 6, nephrology consult   Hypoalbuminemia -IV albumin infusions as above   Type 2 diabetes mellitus with diabetic neuropathy, unspecified (HCC) -sliding scale insulin with corresponding glucose checks   CAD (coronary artery disease) -continue home meds   CKD (chronic kidney disease) stage 3, GFR 30-59 ml/min (HCC) -at baseline, avoid nephrotoxins and monitor   Hypothyroidism -home dose thyroid replacement  Chart review performed and case discussed with ED provider. Labs, imaging and/or ECG reviewed by provider and discussed with patient/family. Management plans discussed with the patient and/or family.  DVT PROPHYLAXIS: SubQ lovenox   GI PROPHYLAXIS:  PPI   ADMISSION STATUS: Inpatient     CODE STATUS: DNR Code Status History    Date Active Date Inactive Code Status Order ID Comments User Context   09/16/2017 1346 09/18/2017 1544 DNR [id number]  Jimmy Footman, NP Inpatient   09/14/2017 0254 09/16/2017 1346 Full Code [id number]  Amelia Jo, MD Inpatient   01/25/2017 1037 01/27/2017 1704 Full Code [id number]  Bettey Costa, MD ED   09/30/2015 1609 10/03/2015 1530 Full Code [id number]  Demetrios Loll, MD Inpatient    Questions for Most Recent Historical Code Status (Order [id number])    Question Answer Comment   In the event of cardiac or respiratory ARREST Do not  call a "code blue"    In the event of cardiac or respiratory ARREST Do not perform Intubation, CPR, defibrillation or ACLS    In the event of cardiac or respiratory ARREST Use medication by any route, position, wound care, and other measures to relive pain and suffering. May use oxygen, suction and manual treatment of airway obstruction as needed for comfort.       TOTAL TIME TAKING CARE OF THIS PATIENT: 45 minutes.   Markala Sitts [place] 10/18/2017, 9:39 PM  [facility]  Office  [phone]  CC: Primary care physician; [name], [name], MD  Note:  This document was prepared using Dragon voice recognition software and may include unintentional dictation errors.

## 2017-10-18 NOTE — ED Triage Notes (Signed)
Arrives with c/o weakness and nausea since Tuesday.  Patient has history of cirrhosis and ascites.  Also has history of low sodium.

## 2017-10-18 NOTE — ED Provider Notes (Signed)
[facility] Emergency Department Provider Note    First MD Initiated Contact with Patient 10/18/17 1319     (approximate)  I have reviewed the triage vital signs and the nursing notes.   HISTORY  Chief Complaint Nausea    HPI Rodney Stewart is a 62 y.o. male with extensive and complex past medical history most probably with developing hepatorenal syndrome presents ER with worsening abdominal pain abdominal distention decreased oral intake and confusion.  Is reporting increased weakness over this past several days.  Was given referral to palliative care but has not established with them yet.  Was evaluated just 3 days ago for TIPS procedure given recurrent ascites.  Denies any fevers.    Past Medical History:  Diagnosis Date  . Anemia   . Ascites   . BPH (benign prostatic hyperplasia)   . Cirrhosis ([place])   . CKD (chronic kidney disease) stage 3, GFR 30-59 ml/min (HCC)   . Clear cell renal cell carcinoma ([place]) 2014   Left Nephrectomy.  . Colon polyps   . Diabetes mellitus without complication ([place])    type 2   . Dyspnea    with exertion  . GERD (gastroesophageal reflux disease)   . History of gout   . History of nephrectomy    Left  . Hypertension   . Neuropathy   . Renal Cancer    Renal Cancer  . Renal cell carcinoma of left kidney (HCC)    mets to lungs  . Umbilical hernia    Family History  Problem Relation Age of Onset  . Ovarian cancer Mother   . Diabetes Father   . Hypertension Father    Past Surgical History:  Procedure Laterality Date  . BACK SURGERY     ruptured disc 1991   . ENDOBRONCHIAL ULTRASOUND N/A 10/14/2015   Procedure: ENDOBRONCHIAL ULTRASOUND;  Surgeon: [name], MD;  Location: [facility] ORS;  Service: Pulmonary;  Laterality: N/A;  . ESOPHAGOGASTRODUODENOSCOPY (EGD) WITH PROPOFOL N/A 04/15/2017   Procedure: ESOPHAGOGASTRODUODENOSCOPY (EGD) WITH PROPOFOL;  Surgeon: Jonathon Bellows, MD;  Location: [facility] ENDOSCOPY;   Service: Gastroenterology;  Laterality: N/A;  Screen for esophageal varices  . EYE SURGERY Bilateral    Cataract Extraction with IOL  . IR RADIOLOGIST EVAL & MGMT  10/15/2017  . KNEE SURGERY Right   . NEPHRECTOMY     left kidney 2014 renal cell cancer    Patient Active Problem List   Diagnosis Date Noted  . Hypothyroidism 10/03/2017  . Bilateral leg edema 10/03/2017  . Hydrocele 10/03/2017  . Protein-calorie malnutrition, severe 09/15/2017  . Acute hyponatremia 09/14/2017  . Ascites   . Hyponatremia 05/28/2017  . Colon polyps 04/26/2017  . Umbilical hernia 37/16/9678  . Left inguinal hernia 04/26/2017  . Gout 04/26/2017  . Fatty liver 04/26/2017  . Leukopenia 04/26/2017  . Gallstones 04/26/2017  . Type 2 diabetes mellitus with diabetic neuropathy, unspecified ([place]) 04/26/2017  . CAD (coronary artery disease) 04/26/2017  . Chronic knee pain 04/26/2017  . Protein-calorie malnutrition ([place]) 04/26/2017  . Cirrhosis ([place]) 02/17/2017  . Neutropenia associated with infection ([place])   . Malignant neoplasm of kidney ([place])   . Thrombocytopenia ([place])   . Iron deficiency anemia 01/23/2017  . Goals of care, counseling/discussion 11/27/2016  . BPH (benign prostatic hyperplasia) 10/27/2016  . Effusion of left olecranon bursa 10/27/2016  . Foreign body in left ear 10/27/2016  . Pancytopenia ([place]) 10/27/2016  . Alcohol abuse 08/28/2016  . Nonimmune to hepatitis B virus  08/02/2016  . Metastatic renal cell carcinoma to lung ([place])   . ARF (acute renal failure) ([place]) 09/30/2015  . Type 2 diabetes mellitus with moderate nonproliferative diabetic retinopathy and without macular edema ([place]) 09/17/2014  . Cataract 08/20/2014  . Hypertension 07/02/2014  . CKD (chronic kidney disease) stage 3, GFR 30-59 ml/min (HCC) 11/30/2013  . Single kidney 02/04/2013  . Renal cell carcinoma ([place]) 01/02/2012      Prior to Admission medications   Medication Sig Start Date End Date Taking? Authorizing  Provider  allopurinol (ZYLOPRIM) 100 MG tablet Take 1 tablet (100 mg total) by mouth daily. 08/27/17   [name], [name], MD  ALPRAZolam Duanne Moron) 1 MG tablet Take 1 mg by mouth as needed for sleep. 06/10/16   [provider]  aspirin EC 81 MG tablet Take 81 mg by mouth daily.    [provider]  feeding supplement, ENSURE ENLIVE, (ENSURE ENLIVE) LIQD Take 237 mLs by mouth 3 (three) times daily between meals. 09/18/17   Bettey Costa, MD  furosemide (LASIX) 20 MG tablet Take 1 tablet (20 mg total) by mouth daily. In am Patient not taking: Reported on 10/09/2017 10/03/17   [name], [name], MD  gabapentin (NEURONTIN) 100 MG capsule Take 2 capsules (200 mg total) by mouth 3 (three) times daily. Patient taking differently: Take 200 mg by mouth 2 (two) times daily.  04/26/17   [name], [name], MD  HYDROcodone-acetaminophen (NORCO) 10-325 MG tablet TK 1 T PO BID PRN 09/27/17   [provider]  insulin aspart (NOVOLOG) 100 UNIT/ML FlexPen Before meals 131-180 2 units, 181-240 4 units, 241-300 6 units, 301-350 8 units, 351-400 10 units, >400 12 units 09/30/17   [name], [name], MD  Insulin Detemir (LEVEMIR FLEXTOUCH) 100 UNIT/ML Pen Inject 40 Units into the skin daily at 10 pm. 05/28/17   [name], [name], MD  Insulin Pen Needle 32G X 4 MM MISC 1 Device by Does not apply route 2 (two) times daily. E11.9 09/23/17   [name], [name], MD  levothyroxine (SYNTHROID, LEVOTHROID) 25 MCG tablet Take 1 tablet (25 mcg total) by mouth daily before breakfast. 30 minutes 10/03/17   [name], [name], MD  lovastatin (MEVACOR) 20 MG tablet Take 1 tablet (20 mg total) by mouth daily at 6 PM. 08/08/17   [name], [name], MD  magic mouthwash w/lidocaine SOLN Take 5 mLs by mouth 3 (three) times daily as needed for mouth pain. Swish and spit 09/23/17   [name], [name], MD  ONE TOUCH ULTRA TEST test strip Bid 05/28/17   [name], [name], MD   oxyCODONE (ROXICODONE) 5 MG immediate release tablet Take 1 tablet (5 mg total) by mouth every 6 (six) hours as needed for severe pain or breakthrough pain. 09/25/17   Jacquelin Hawking, NP  tamsulosin (FLOMAX) 0.4 MG CAPS capsule Take 1 capsule (0.4 mg total) by mouth every other day. With supper 10/07/17   [name], [name], MD  valACYclovir (VALTREX) 1000 MG tablet Take 1 tablet (1,000 mg total) by mouth 2 (two) times daily. X 5-10 days with food 09/23/17   [name], [name], MD    Allergies Patient has no known allergies.    Social History Social History   Tobacco Use  . Smoking status: Former Smoker    Packs/day: 1.00    Types: Cigarettes    Last attempt to quit: 04/15/1993    Years since quitting: 24.5  . Smokeless tobacco: Never Used  Substance Use Topics  .  Alcohol use: Not Currently  . Drug use: No    Review of Systems Patient denies headaches, rhinorrhea, blurry vision, numbness, shortness of breath, chest pain, edema, cough, abdominal pain, nausea, vomiting, diarrhea, dysuria, fevers, rashes or hallucinations unless otherwise stated above in HPI. ____________________________________________   PHYSICAL EXAM:  VITAL SIGNS: Vitals:   10/18/17 0944 10/18/17 1533  BP: 140/85 124/83  Pulse: (!) 110 (!) 101  Temp: 98 F (36.7 C)   SpO2: 100% 98%    Constitutional: Alert and oriented. Chronically ill appearing Eyes: Conjunctivae are normal.  Head: Atraumatic. Nose: No congestion/rhinnorhea. Mouth/Throat: Mucous membranes are moist.   Neck: No stridor. Painless ROM.  Cardiovascular: Normal rate, regular rhythm. Grossly normal heart sounds.  Good peripheral circulation. Respiratory: Normal respiratory effort.  No retractions. Lungs CTAB. Gastrointestinal: Soft and nontender. No distention. No abdominal bruits. No CVA tenderness.4 Genitourinary: deferred Musculoskeletal: No lower extremity tenderness nor edema.  No joint effusions. Neurologic:  Normal  speech and language. No gross focal neurologic deficits are appreciated. No facial droop Skin:  Skin is warm, dry and intact. No rash noted. Psychiatric: Mood and affect are normal. Speech and behavior are normal.  ____________________________________________   LABS (all labs ordered are listed, but only abnormal results are displayed)  Results for orders placed or performed during the hospital encounter of 10/18/17 (from the past 24 hour(s))  Lipase, blood     Status: None   Collection Time: 10/18/17 10:03 AM  Result Value Ref Range   Lipase 31 11 - 51 U/L  Comprehensive metabolic panel     Status: Abnormal   Collection Time: 10/18/17 10:03 AM  Result Value Ref Range   Sodium 121 (L) 135 - 145 mmol/L   Potassium 5.6 (H) 3.5 - 5.1 mmol/L   Chloride 86 (L) 98 - 111 mmol/L   CO2 24 22 - 32 mmol/L   Glucose, Bld 295 (H) 70 - 99 mg/dL   BUN 19 8 - 23 mg/dL   Creatinine, Ser 1.43 (H) 0.61 - 1.24 mg/dL   Calcium 7.7 (L) 8.9 - 10.3 mg/dL   Total Protein 6.7 6.5 - 8.1 g/dL   Albumin 2.0 (L) 3.5 - 5.0 g/dL   AST 31 15 - 41 U/L   ALT 14 0 - 44 U/L   Alkaline Phosphatase 289 (H) 38 - 126 U/L   Total Bilirubin 3.7 (H) 0.3 - 1.2 mg/dL   GFR calc non Af Amer 51 (L) >60 mL/min   GFR calc Af Amer 60 (L) >60 mL/min   Anion gap 11 5 - 15  CBC     Status: Abnormal   Collection Time: 10/18/17 10:03 AM  Result Value Ref Range   WBC 6.1 4.0 - 10.5 K/uL   RBC 4.00 (L) 4.22 - 5.81 MIL/uL   Hemoglobin 11.5 (L) 13.0 - 17.0 g/dL   HCT 33.9 (L) 39.0 - 52.0 %   MCV 84.8 80.0 - 100.0 fL   MCH 28.8 26.0 - 34.0 pg   MCHC 33.9 30.0 - 36.0 g/dL   RDW 18.2 (H) 11.5 - 15.5 %   Platelets 145 (L) 150 - 400 K/uL   nRBC 0.0 0.0 - 0.2 %  Ammonia     Status: None   Collection Time: 10/18/17 10:03 AM  Result Value Ref Range   Ammonia 17 9 - 35 umol/L  Protime-INR     Status: Abnormal   Collection Time: 10/18/17 10:03 AM  Result Value Ref Range   Prothrombin Time 17.5 (H) 11.4 -  15.2 seconds   INR 1.45      ____________________________________________  EKG My review and personal interpretation at Time: 10:15   Indication: weakness  Rate: 105  Rhythm: sinus Axis: normal Other: no stemi,  ____________________________________________  RADIOLOGY  I personally reviewed all radiographic images ordered to evaluate for the above acute complaints and reviewed radiology reports and findings.  These findings were personally discussed with the patient.  Please see medical record for radiology report.  ____________________________________________   PROCEDURES  Procedure(s) performed:  .Critical Care Performed by: Merlyn Lot, MD Authorized by: Merlyn Lot, MD   Critical care provider statement:    Critical care time (minutes):  30   Critical care time was exclusive of:  Separately billable procedures and treating other patients   Critical care was necessary to treat or prevent imminent or life-threatening deterioration of the following conditions:  Hepatic failure and metabolic crisis   Critical care was time spent personally by me on the following activities:  Development of treatment plan with patient or surrogate, discussions with consultants, evaluation of patient's response to treatment, examination of patient, obtaining history from patient or surrogate, ordering and performing treatments and interventions, ordering and review of laboratory studies, ordering and review of radiographic studies, pulse oximetry, re-evaluation of patient's condition and review of old charts      Critical Care performed:  Yes ____________________________________________   INITIAL IMPRESSION / ASSESSMENT AND PLAN / ED COURSE  Pertinent labs & imaging results that were available during my care of the patient were reviewed by me and considered in my medical decision making (see chart for details).   DDX: Dehydration, sepsis, pna, uti, hypoglycemia, cva, drug effect, withdrawal,  encephalitis   Rodney Stewart is a 62 y.o. who presents to the ED with symptoms as described above.  Patient afebrile but does appear dehydrated and ill.  No evidence of hepatic encephalopathy his ammonia is negative and does not have any liver flap.  His abdominal exam is soft but distended with positive fluid wave.  Doubt SBP.  Does not appear to be almost anasarca blood work does show profoundly low albumin.   The patient will be placed on continuous pulse oximetry and telemetry for monitoring.  Laboratory evaluation will be sent to evaluate for the above complaints.      Clinical Course as of Oct 18 1617  Fri Oct 18, 2017  1417 Reviewed the patient's past medical history and recent visits outpatient centers was evaluated by IR for TIPS procedure given recurrent large volume ascites requiring multiple paracentesis.  Is felt to be high risk for procedure due to primarily elevated meld score secondary to hyponatremia.  Patient was referred back to GI nephrology but has not been able to make any progress on his hyponatremia.  He reports he is having progressive weakness decreased oral intake despite this increasing swelling and pain issues secondary to abdominal distention.   [PR]  1552 Discussed case with Dr. Candiss Norse of nephrology.  He is recommended runs of IV albumin with trial of diuresis due to intravascular depletion to evaluate for interval improvement in Na as free water restriction not helping and patient failing outpatient management.   [PR]    Clinical Course User Index [PR] Merlyn Lot, MD     As part of my medical decision making, I reviewed the following data within the [place] notes reviewed and incorporated, Labs reviewed, notes from prior ED visits.   ____________________________________________   FINAL CLINICAL IMPRESSION(S) /  ED DIAGNOSES  Final diagnoses:  Hepatorenal syndrome (HCC)  Hyponatremia  Hyperkalemia  Generalized abdominal  pain      NEW MEDICATIONS STARTED DURING THIS VISIT:  New Prescriptions   No medications on file     Note:  This document was prepared using Dragon voice recognition software and may include unintentional dictation errors.    Merlyn Lot, MD 10/18/17 [phone]

## 2017-10-19 LAB — URINALYSIS, COMPLETE (UACMP) WITH MICROSCOPIC
BACTERIA UA: NONE SEEN
Bilirubin Urine: NEGATIVE
GLUCOSE, UA: 150 mg/dL — AB
Hgb urine dipstick: NEGATIVE
Ketones, ur: 5 mg/dL — AB
LEUKOCYTES UA: NEGATIVE
NITRITE: NEGATIVE
PH: 5 (ref 5.0–8.0)
PROTEIN: NEGATIVE mg/dL
SPECIFIC GRAVITY, URINE: 1.012 (ref 1.005–1.030)

## 2017-10-19 LAB — BASIC METABOLIC PANEL
ANION GAP: 10 (ref 5–15)
BUN: 22 mg/dL (ref 8–23)
CALCIUM: 7.5 mg/dL — AB (ref 8.9–10.3)
CO2: 24 mmol/L (ref 22–32)
CREATININE: 1.63 mg/dL — AB (ref 0.61–1.24)
Chloride: 88 mmol/L — ABNORMAL LOW (ref 98–111)
GFR, EST AFRICAN AMERICAN: 51 mL/min — AB (ref >60)
GFR, EST NON AFRICAN AMERICAN: 44 mL/min — AB (ref >60)
GLUCOSE: 303 mg/dL — AB (ref 70–99)
Potassium: 5.3 mmol/L — ABNORMAL HIGH (ref 3.5–5.1)
Sodium: 122 mmol/L — ABNORMAL LOW (ref 135–145)

## 2017-10-19 LAB — CBC
HCT: 31.4 % — ABNORMAL LOW (ref 39.0–52.0)
Hemoglobin: 10.4 g/dL — ABNORMAL LOW (ref 13.0–17.0)
MCH: 28.3 pg (ref 26.0–34.0)
MCHC: 33.1 g/dL (ref 30.0–36.0)
MCV: 85.6 fL (ref 80.0–100.0)
PLATELETS: 137 10*3/uL — AB (ref 150–400)
RBC: 3.67 MIL/uL — AB (ref 4.22–5.81)
RDW: 18.4 % — AB (ref 11.5–15.5)
WBC: 6.7 10*3/uL (ref 4.0–10.5)
nRBC: 0 % (ref 0.0–0.2)

## 2017-10-19 LAB — GLUCOSE, CAPILLARY
GLUCOSE-CAPILLARY: 218 mg/dL — AB (ref 70–99)
GLUCOSE-CAPILLARY: 285 mg/dL — AB (ref 70–99)
GLUCOSE-CAPILLARY: 326 mg/dL — AB (ref 70–99)
Glucose-Capillary: 303 mg/dL — ABNORMAL HIGH (ref 70–99)
Glucose-Capillary: 286 mg/dL — ABNORMAL HIGH (ref 70–99)

## 2017-10-19 LAB — SODIUM
SODIUM: 123 mmol/L — AB (ref 135–145)
Sodium: 122 mmol/L — ABNORMAL LOW (ref 135–145)
Sodium: 120 mmol/L — ABNORMAL LOW (ref 135–145)

## 2017-10-19 LAB — OSMOLALITY: Osmolality: 268 mOsm/kg — ABNORMAL LOW (ref 275–295)

## 2017-10-19 MED ORDER — LEVOTHYROXINE SODIUM 25 MCG PO TABS
25.0000 ug | ORAL_TABLET | Freq: Every day | ORAL | Status: DC
  Administered 2017-10-19 – 2017-10-22 (×3): 25 ug via ORAL
  Filled 2017-10-19 (×4): qty 1

## 2017-10-19 MED ORDER — MORPHINE SULFATE (PF) 2 MG/ML IV SOLN: 2.0000 mg | INTRAVENOUS | Status: DC | PRN

## 2017-10-19 MED ORDER — ALBUMIN HUMAN 25 % IV SOLN
12.5000 g | Freq: Once | INTRAVENOUS | Status: AC
  Administered 2017-10-19: 06:00:00 12.5 g via INTRAVENOUS
  Filled 2017-10-19 (×2): qty 50

## 2017-10-19 MED ORDER — ALPRAZOLAM 1 MG PO TABS
1.0000 mg | ORAL_TABLET | Freq: Two times a day (BID) | ORAL | Status: DC | PRN
  Administered 2017-10-19 – 2017-10-22 (×4): 1 mg via ORAL
  Filled 2017-10-19 (×4): qty 1

## 2017-10-19 MED ORDER — ENOXAPARIN SODIUM 40 MG/0.4ML ~~LOC~~ SOLN
40.0000 mg | SUBCUTANEOUS | Status: DC
  Administered 2017-10-19 – 2017-10-21 (×3): 40 mg via SUBCUTANEOUS
  Filled 2017-10-19 (×3): qty 0.4

## 2017-10-19 MED ORDER — PROCHLORPERAZINE EDISYLATE 10 MG/2ML IJ SOLN
5.0000 mg | Freq: Four times a day (QID) | INTRAMUSCULAR | Status: DC | PRN
  Filled 2017-10-19: qty 1

## 2017-10-19 MED ORDER — OXYCODONE HCL 5 MG PO TABS
5.0000 mg | ORAL_TABLET | Freq: Four times a day (QID) | ORAL | Status: DC | PRN
  Administered 2017-10-19 – 2017-10-21 (×2): 5 mg via ORAL
  Filled 2017-10-19 (×2): qty 1

## 2017-10-19 MED ORDER — FUROSEMIDE 10 MG/ML IJ SOLN
40.0000 mg | Freq: Once | INTRAMUSCULAR | Status: AC
  Administered 2017-10-19: 40 mg via INTRAVENOUS
  Filled 2017-10-19: qty 4

## 2017-10-19 MED ORDER — INSULIN ASPART 100 UNIT/ML FLEXPEN: 0.0000 [IU] | PEN_INJECTOR | Freq: Three times a day (TID) | SUBCUTANEOUS | Status: DC

## 2017-10-19 MED ORDER — ONDANSETRON HCL 4 MG/2ML IJ SOLN: 4.0000 mg | Freq: Four times a day (QID) | INTRAMUSCULAR | Status: DC | PRN

## 2017-10-19 MED ORDER — GABAPENTIN 100 MG PO CAPS
200.0000 mg | ORAL_CAPSULE | Freq: Two times a day (BID) | ORAL | Status: DC
  Administered 2017-10-19 – 2017-10-22 (×6): 200 mg via ORAL
  Filled 2017-10-19 (×7): qty 2

## 2017-10-19 MED ORDER — ALBUMIN HUMAN 25 % IV SOLN
25.0000 g | Freq: Three times a day (TID) | INTRAVENOUS | Status: AC
  Administered 2017-10-19 – 2017-10-22 (×9): 25 g via INTRAVENOUS
  Filled 2017-10-19 (×9): qty 100

## 2017-10-19 MED ORDER — INSULIN ASPART 100 UNIT/ML ~~LOC~~ SOLN
0.0000 [IU] | Freq: Three times a day (TID) | SUBCUTANEOUS | Status: DC
  Administered 2017-10-19: 12:00:00 5 [IU] via SUBCUTANEOUS
  Administered 2017-10-19: 08:00:00 7 [IU] via SUBCUTANEOUS
  Administered 2017-10-19: 3 [IU] via SUBCUTANEOUS
  Administered 2017-10-20 (×3): 7 [IU] via SUBCUTANEOUS
  Administered 2017-10-21 (×2): 5 [IU] via SUBCUTANEOUS
  Administered 2017-10-21: 9 [IU] via SUBCUTANEOUS
  Administered 2017-10-22: 09:00:00 1 [IU] via SUBCUTANEOUS
  Administered 2017-10-22: 13:00:00 2 [IU] via SUBCUTANEOUS
  Filled 2017-10-19 (×11): qty 1

## 2017-10-19 MED ORDER — ASPIRIN EC 81 MG PO TBEC
81.0000 mg | DELAYED_RELEASE_TABLET | Freq: Every day | ORAL | Status: DC
  Administered 2017-10-19 – 2017-10-22 (×3): 81 mg via ORAL
  Filled 2017-10-19 (×4): qty 1

## 2017-10-19 MED ORDER — INSULIN ASPART 100 UNIT/ML ~~LOC~~ SOLN
0.0000 [IU] | Freq: Every day | SUBCUTANEOUS | Status: DC
  Administered 2017-10-19: 01:00:00 4 [IU] via SUBCUTANEOUS
  Administered 2017-10-19: 22:00:00 3 [IU] via SUBCUTANEOUS
  Administered 2017-10-20: 23:00:00 5 [IU] via SUBCUTANEOUS
  Administered 2017-10-21: 21:00:00 2 [IU] via SUBCUTANEOUS
  Filled 2017-10-19 (×4): qty 1

## 2017-10-19 MED ORDER — ONDANSETRON HCL 4 MG PO TABS
4.0000 mg | ORAL_TABLET | Freq: Four times a day (QID) | ORAL | Status: DC | PRN
  Administered 2017-10-21: 22:00:00 4 mg via ORAL
  Filled 2017-10-19: qty 1

## 2017-10-19 MED ORDER — FUROSEMIDE 10 MG/ML IJ SOLN
60.0000 mg | Freq: Three times a day (TID) | INTRAMUSCULAR | Status: AC
  Administered 2017-10-19 – 2017-10-22 (×9): 60 mg via INTRAVENOUS
  Filled 2017-10-19 (×10): qty 6

## 2017-10-19 MED ORDER — TAMSULOSIN HCL 0.4 MG PO CAPS
0.4000 mg | ORAL_CAPSULE | ORAL | Status: DC
  Administered 2017-10-19 – 2017-10-21 (×2): 0.4 mg via ORAL
  Filled 2017-10-19 (×3): qty 1

## 2017-10-19 NOTE — Progress Notes (Signed)
[place] at [facility]                                                                                                                                                                                  Patient Demographics   Rodney Stewart, is a 62 y.o. male, DOB - [birth year], WGY:[id number]  Admit date - 10/18/2017   Admitting Physician Lance Coon, MD  Outpatient Primary MD for the patient is [name], [name], MD   LOS - 1  Subjective: Patient continues to be very weak Abdomen is distended Lower extremities are swollen  Review of Systems:   CONSTITUTIONAL: No documented fever.  Positive fatigue, weakness. No weight gain, no weight loss.  EYES: No blurry or double vision.  ENT: No tinnitus. No postnasal drip. No redness of the oropharynx.  RESPIRATORY: No cough, no wheeze, no hemoptysis. No dyspnea.  CARDIOVASCULAR: No chest pain. No orthopnea. No palpitations. No syncope.  GASTROINTESTINAL: No nausea, no vomiting or diarrhea. No abdominal pain. No melena or hematochezia.  Positive abdominal distention GENITOURINARY: No dysuria or hematuria.  ENDOCRINE: No polyuria or nocturia. No heat or cold intolerance.  HEMATOLOGY: No anemia. No bruising. No bleeding.  INTEGUMENTARY: No rashes. No lesions.  MUSCULOSKELETAL: No arthritis. No swelling. No gout.  NEUROLOGIC: No numbness, tingling, or ataxia. No seizure-type activity.  PSYCHIATRIC: No anxiety. No insomnia. No ADD.    Vitals:   Vitals:   10/18/17 2300 10/18/17 2348 10/19/17 0559 10/19/17 1225  BP: 115/71 (!) 93/52 119/73 116/79  Pulse: 92 94 100 93  Resp:  18 16 20   Temp:  97.8 F (36.6 C) 97.8 F (36.6 C) 98 F (36.7 C)  TempSrc:  Oral Oral Oral  SpO2: 98% 97% 98% 100%  Weight:      Height:        Wt Readings from Last 3 Encounters:  10/18/17 79.8 kg  10/15/17 80.3 kg  10/09/17 78.6 kg     Intake/Output Summary (Last 24 hours) at 10/19/2017 1544 Last data filed at 10/19/2017  1537 Gross per 24 hour  Intake 484.9 ml  Output 110 ml  Net 374.9 ml    Physical Exam:   GENERAL:  chronically ill-appearing  HEAD, EYES, EARS, NOSE AND THROAT: Atraumatic, normocephalic. Extraocular muscles are intact. Pupils equal and reactive to light. Sclerae anicteric. No conjunctival injection. No oro-pharyngeal erythema.  NECK: Supple. There is no jugular venous distention. No bruits, no lymphadenopathy, no thyromegaly.  HEART: Regular rate and rhythm,. No murmurs, no rubs, no clicks.  LUNGS: Clear to auscultation bilaterally. No rales or rhonchi. No wheezes.  ABDOMEN: Soft, flat, nontender, abdomen distended. Has good bowel sounds. No hepatosplenomegaly  appreciated.  EXTREMITIES: No evidence of any cyanosis, clubbing, or 2+ peripheral edema.  +2 pedal and radial pulses bilaterally.  NEUROLOGIC: The patient is alert, awake, and oriented x3 with no focal motor or sensory deficits appreciated bilaterally.  SKIN: Moist and warm with no rashes appreciated.  Psych: Not anxious, depressed LN: No inguinal LN enlargement    Antibiotics   Anti-infectives (From admission, onward)   None      Medications   Scheduled Meds: . aspirin EC  81 mg Oral Daily  . enoxaparin (LOVENOX) injection  40 mg Subcutaneous Q24H  . furosemide  60 mg Intravenous Q8H  . gabapentin  200 mg Oral BID  . insulin aspart  0-5 Units Subcutaneous QHS  . insulin aspart  0-9 Units Subcutaneous TID WC  . levothyroxine  25 mcg Oral QAC breakfast  . tamsulosin  0.4 mg Oral QODAY   Continuous Infusions: . albumin human 25 g (10/19/17 1511)   PRN Meds:.ALPRAZolam, morphine injection, ondansetron **OR** ondansetron (ZOFRAN) IV, oxyCODONE, prochlorperazine   Data Review:   Micro Results No results found for this or any previous visit (from the past 240 hour(s)).  Radiology Reports Dg Chest 2 View  Result Date: 10/18/2017 CLINICAL DATA:  Shortness of breath over the past 6 months, worse over the past 2  days. History of metastatic renal cell carcinoma EXAM: CHEST - 2 VIEW COMPARISON:  CT chest dated August 30, 2017. Chest x-ray dated January 25, 2017. FINDINGS: Normal heart size. Fullness of both hilar regions is unchanged. Small bilateral pleural effusions. No consolidation or pneumothorax. No acute osseous abnormality. IMPRESSION: 1. New small bilateral pleural effusions. 2. Unchanged bilateral hilar fullness corresponding to known hilar lymphadenopathy. Electronically Signed   By: Titus Dubin M.D.   On: 10/18/2017 15:24   Ct Abdomen Pelvis W Contrast  Result Date: 09/20/2017 CLINICAL DATA:  Bilateral lower abdominal pain radiating across the lower abdomen. Patient was released from hospital Wednesday after paracentesis. Patient has had umbilical hernia for years. EXAM: CT ABDOMEN AND PELVIS WITH CONTRAST TECHNIQUE: Multidetector CT imaging of the abdomen and pelvis was performed using the standard protocol following bolus administration of intravenous contrast. CONTRAST:  158mL ISOVUE-300 IOPAMIDOL (ISOVUE-300) INJECTION 61% COMPARISON:  09/13/2017 CT FINDINGS: Lower chest: Normal heart size without pericardial effusion. Redemonstration of dominant pulmonary mass in the left lower lobe measuring approximately 2.7 x 2.4 cm allowing for slice sampling differences in operator dependent imaging. Smaller scattered pulmonary nodules are also present within both lower lobes. Trace right greater than left pleural effusions. Hepatobiliary: Cirrhotic appearance of the liver with heterogeneous somewhat wedge-shaped area of hypo density involving the medial segment of left hepatic lobe but without definable mass. Nondistended gallbladder with layering gallstones. No biliary dilatation. Pancreas: Unremarkable Spleen: Enlarged up to 16.5 cm without focal mass stable since recent. Adrenals/Urinary Tract: Surgically absent left adrenal gland and kidney. The right adrenal gland and right kidney are unremarkable.  Stomach/Bowel: The stomach is decompressed in appearance. The duodenal sweep and ligament of Treitz are normal in position. No small bowel dilatation or obstruction is identified. Mildly edematous appearance of the small bowel likely sympathetic from surrounding ascites. Likewise there is somewhat thickened appearance of the cecum and ascending colon since prior with submucosal fatty change. Possibility of a colitis is not entirely excluded. Centralized loops of small bowel secondary to large volume of ascites. No bowel obstruction. Vascular/Lymphatic: Nonaneurysmal thoracic aorta with atherosclerosis. No adenopathy. Multiple upper abdominal varices. Reproductive: Mildly enlarged prostate. Other: Ventral hernia containing  loculated fluid is again redemonstrated without definite herniation of bowel given lack of more proximal bowel this measures 10.6 x 9.6 x 2.7 cm (transverse by CC by AP dimension) with the mouth of hernia measuring 4.2 x 1.7 cm in transverse by craniocaudad. Previously this hernia was estimated at 9.5 cm transverse by 2.7 cm in thickness. Prominent enhancing omental vessels project into the hernia as before. Moderate to large volume of ascites redemonstrated. Musculoskeletal: Degenerative disc disease L3-4 and L4-5. IMPRESSION: 1. Moderate to large volume of ascites despite recent paracentesis suggests reaccumulation of fluid given similar appearance of the abdomen and pelvis 09/13/2017 (patient had a paracentesis performed on 09/16/2017). 2. Cirrhotic liver with splenomegaly and stigmata of portal hypertension. 3. Slightly wedge shaped perfusion anomaly of the medial segment of left hepatic lobe similar to prior. A mass cannot be entirely excluded. 4. Stable pulmonary nodules with dominant mass in left lower lobe. 5. Large multiloculated fluid containing ventral hernia with herniation of omental vessels as before currently estimated at 10.6 x 9.6 x 2.7 cm. This appears more prominent than on  prior. Electronically Signed   By: Ashley Royalty M.D.   On: 09/20/2017 21:01   US Scrotum W/doppler  Result Date: 09/25/2017 CLINICAL DATA:  Scrotal swelling EXAM: SCROTAL ULTRASOUND DOPPLER ULTRASOUND OF THE TESTICLES TECHNIQUE: Complete ultrasound examination of the testicles, epididymis, and other scrotal structures was performed. Color and spectral Doppler ultrasound were also utilized to evaluate blood flow to the testicles. COMPARISON:  None. FINDINGS: Right testicle Measurements: 4.1 x 2.6 x 2.7 cm. No mass or microlithiasis visualized. Left testicle Measurements: 4.1 x 2.7 x 2.8 cm. No mass or microlithiasis visualized. Right epididymis:  Normal in size and appearance. Left epididymis:  1.1 cm epididymal head cyst or spermatocele. Hydrocele:  Large bilateral hydroceles Varicocele:  None visualized. Pulsed Doppler interrogation of both testes demonstrates normal low resistance arterial and venous waveforms bilaterally. Other: Complex fluid collections are noted in the inguinal canal regions bilaterally with numerous internal septations. These may be related to hernias containing loculated fluid. IMPRESSION: No focal testicular abnormality or evidence of testicular torsion. Large bilateral hydroceles. Complex fluid-filled areas within the inguinal canals bilaterally, likely loculated ascites related to the large volume abdominal ascites seen on prior CT. Electronically Signed   By: Rolm Baptise M.D.   On: 09/25/2017 13:23   Ir Radiologist Eval & Mgmt  Result Date: 10/15/2017 Please refer to notes tab for details about interventional procedure. (Op Note)    CBC Recent Labs  Lab 10/18/17 1003 10/19/17 0359  WBC 6.1 6.7  HGB 11.5* 10.4*  HCT 33.9* 31.4*  PLT 145* 137*  MCV 84.8 85.6  MCH 28.8 28.3  MCHC 33.9 33.1  RDW 18.2* 18.4*    Chemistries  Recent Labs  Lab 10/18/17 1003 10/19/17 0359 10/19/17 1041  NA 121* 122* 122*  K 5.6* 5.3*  --   CL 86* 88*  --   CO2 24 24  --    GLUCOSE 295* 303*  --   BUN 19 22  --   CREATININE 1.43* 1.63*  --   CALCIUM 7.7* 7.5*  --   AST 31  --   --   ALT 14  --   --   ALKPHOS 289*  --   --   BILITOT 3.7*  --   --    ------------------------------------------------------------------------------------------------------------------ estimated creatinine clearance is 50.7 mL/min (A) (by C-G formula based on SCr of 1.63 mg/dL (H)). ------------------------------------------------------------------------------------------------------------------ No results for input(s): HGBA1C  in the last 72 hours. ------------------------------------------------------------------------------------------------------------------ No results for input(s): CHOL, HDL, LDLCALC, TRIG, CHOLHDL, LDLDIRECT in the last 72 hours. ------------------------------------------------------------------------------------------------------------------ No results for input(s): TSH, T4TOTAL, T3FREE, THYROIDAB in the last 72 hours.  Invalid input(s): FREET3 ------------------------------------------------------------------------------------------------------------------ No results for input(s): VITAMINB12, FOLATE, FERRITIN, TIBC, IRON, RETICCTPCT in the last 72 hours.  Coagulation profile Recent Labs  Lab 10/18/17 1003  INR 1.45    No results for input(s): DDIMER in the last 72 hours.  Cardiac Enzymes No results for input(s): CKMB, TROPONINI, MYOGLOBIN in the last 168 hours.  Invalid input(s): CK ------------------------------------------------------------------------------------------------------------------ Invalid input(s): [place]  Patient admitted with weakness worsening swelling hyponatremia  1.  Hyponatremia due to hypovolemia Appreciate nephrology input IV Lasix and albumin Follow sodium level  2. Cirrhosis ([place]) -avoid hepatotoxins, patient likely has hepatorenal syndrome contributing to his significant fluid overload.  His  hypoalbuminemia is causing his fluid to third space.   3.   Hypoalbuminemia -continue IV albumin infusions as above 4.  Type 2 diabetes mellitus with diabetic neuropathy, unspecified (HCC) -sliding scale insulin with corresponding glucose checks 5.  CAD (coronary artery disease) -continue home meds 6. CKD (chronic kidney disease) stage 3, GFR 30-59 ml/min (HCC) -at baseline, avoid nephrotoxins and monitor 7.  Hypothyroidism -home dose thyroid replacement       Code Status Orders  (From admission, onward)         Start     Ordered   10/19/17 0012  Do not attempt resuscitation (DNR)  Continuous    Question Answer Comment  In the event of cardiac or respiratory ARREST Do not call a "code blue"   In the event of cardiac or respiratory ARREST Do not perform Intubation, CPR, defibrillation or ACLS   In the event of cardiac or respiratory ARREST Use medication by any route, position, wound care, and other measures to relive pain and suffering. May use oxygen, suction and manual treatment of airway obstruction as needed for comfort.      10/19/17 0011        Code Status History    Date Active Date Inactive Code Status Order ID Comments User Context   09/16/2017 1346 09/18/2017 1544 DNR [id number]  Jimmy Footman, NP Inpatient   09/14/2017 0254 09/16/2017 1346 Full Code [id number]  Amelia Jo, MD Inpatient   01/25/2017 1037 01/27/2017 1704 Full Code [id number]  Bettey Costa, MD ED   09/30/2015 1609 10/03/2015 1530 Full Code [id number]  Demetrios Loll, MD Inpatient           Consults nephrology  DVT Prophylaxis SCD Lab Results  Component Value Date   PLT 137 (L) 10/19/2017     Time Spent in minutes   70min  Greater than 50% of time spent in care coordination and counseling patient regarding the condition and plan of care.   Dustin Flock M.D on 10/19/2017 at 3:44 PM  Between 7am to 6pm - Pager - [phone]  After 6pm go to [email or web address] - Proofreader  [employer]   Office  [phone]

## 2017-10-19 NOTE — Progress Notes (Signed)
[facility]  ROUNDING NOTE   Subjective:   Mr. Rodney Stewart admitted to [facility] on 10/18/2017 for Hepatorenal syndrome ([place]) [K76.7] Hyperkalemia [E87.5] Hyponatremia [E87.1] Generalized abdominal pain [R10.84]  Daughter at bedside. Patient complains of scrotal edema.   Na 122   Given IV furosemide and albumin overnight.   Objective:  Vital signs in last 24 hours:  Temp:  [97.8 F (36.6 C)-98 F (36.7 C)] 98 F (36.7 C) (10/19 1225) Pulse Rate:  [92-103] 93 (10/19 1225) Resp:  [16-20] 20 (10/19 1225) BP: (93-130)/(52-83) 116/79 (10/19 1225) SpO2:  [97 %-100 %] 100 % (10/19 1225)  Weight change:  Filed Weights   10/18/17 0947  Weight: 79.8 kg    Intake/Output: I/O last 3 completed shifts: In: 552 [IV Piggyback:552] Out: 110 [Urine:110]   Intake/Output this shift:  No intake/output data recorded.  Physical Exam: General: NAD, laying in bed  Head: Normocephalic, atraumatic. Moist oral mucosal membranes  Eyes: Anicteric, PERRL  Neck: Supple, trachea midline  Lungs:  Clear to auscultation  Heart: Regular rate and rhythm  Abdomen:  +ascites   Extremities: no peripheral edema.  Neurologic: Nonfocal, moving all four extremities  Skin: No lesions  GU: Scrotal edema    Basic Metabolic Panel: Recent Labs  Lab 10/18/17 1003 10/19/17 0359 10/19/17 1041  NA 121* 122* 122*  K 5.6* 5.3*  --   CL 86* 88*  --   CO2 24 24  --   GLUCOSE 295* 303*  --   BUN 19 22  --   CREATININE 1.43* 1.63*  --   CALCIUM 7.7* 7.5*  --     Liver Function Tests: Recent Labs  Lab 10/18/17 1003  AST 31  ALT 14  ALKPHOS 289*  BILITOT 3.7*  PROT 6.7  ALBUMIN 2.0*   Recent Labs  Lab 10/18/17 1003  LIPASE 31   Recent Labs  Lab 10/18/17 1003  AMMONIA 17    CBC: Recent Labs  Lab 10/18/17 1003 10/19/17 0359  WBC 6.1 6.7  HGB 11.5* 10.4*  HCT 33.9* 31.4*  MCV 84.8 85.6  PLT 145* 137*    Cardiac Enzymes: No results for input(s): CKTOTAL, CKMB,  CKMBINDEX, TROPONINI in the last 168 hours.  BNP: Invalid input(s): POCBNP  CBG: Recent Labs  Lab 10/19/17 0021 10/19/17 0740 10/19/17 1134  GLUCAP 326* 303* 2*    Microbiology: Results for orders placed or performed during the hospital encounter of 09/13/17  MRSA PCR Screening     Status: None   Collection Time: 09/15/17  8:17 AM  Result Value Ref Range Status   MRSA by PCR NEGATIVE NEGATIVE Final    Comment:        The GeneXpert MRSA Assay (FDA approved for NASAL specimens only), is one component of a comprehensive MRSA colonization surveillance program. It is not intended to diagnose MRSA infection nor to guide or monitor treatment for MRSA infections. Performed at [facility], [address] [place], [place] [address]     Coagulation Studies: Recent Labs    10/18/17 1003  LABPROT 17.5*  INR 1.45    Urinalysis: Recent Labs    10/18/17 1003  COLORURINE AMBER*  LABSPEC 1.012  PHURINE 5.0  GLUCOSEU 150*  HGBUR NEGATIVE  BILIRUBINUR NEGATIVE  KETONESUR 5*  PROTEINUR NEGATIVE  NITRITE NEGATIVE  LEUKOCYTESUR NEGATIVE      Imaging: Dg Chest 2 View  Result Date: 10/18/2017 CLINICAL DATA:  Shortness of breath over the past 6 months, worse over the  past 2 days. History of metastatic renal cell carcinoma EXAM: CHEST - 2 VIEW COMPARISON:  CT chest dated August 30, 2017. Chest x-ray dated January 25, 2017. FINDINGS: Normal heart size. Fullness of both hilar regions is unchanged. Small bilateral pleural effusions. No consolidation or pneumothorax. No acute osseous abnormality. IMPRESSION: 1. New small bilateral pleural effusions. 2. Unchanged bilateral hilar fullness corresponding to known hilar lymphadenopathy. Electronically Signed   By: Titus Dubin M.D.   On: 10/18/2017 15:24     Medications:   . albumin human     . aspirin EC  81 mg Oral Daily  . enoxaparin (LOVENOX) injection  40 mg Subcutaneous Q24H  . furosemide  60 mg Intravenous  Q8H  . gabapentin  200 mg Oral BID  . insulin aspart  0-5 Units Subcutaneous QHS  . insulin aspart  0-9 Units Subcutaneous TID WC  . levothyroxine  25 mcg Oral QAC breakfast  . tamsulosin  0.4 mg Oral QODAY   ALPRAZolam, morphine injection, ondansetron **OR** ondansetron (ZOFRAN) IV, oxyCODONE, prochlorperazine  Assessment/ Plan:  Rodney Stewart is a 62 y.o. white male with BPH, chronic kidney disease stage III baseline creatinine 1.5, metastatic renal cell carcinoma status post left nephrectomy 05/2012, diabetes mellitus type 2, hypertension, EtoH, admitted on 10/18/2017 for Hepatorenal syndrome ([place]) [K76.7] Hyperkalemia [E87.5] Hyponatremia [E87.1] Generalized abdominal pain [R10.84]  1.  Hyponatremia: with hypervolemia - Restart furosemide: IV 60mg  q 8 along with IV albumin.  - Fluid restriction - Serial sodium checks.   2.  Chronic kidney disease stage III with proteinuria: baseline Creatinine of 1.5 Chronic kidney disease secondary to solitary kidney, diabetes and liver failure, alcohol abuse.   3.  Anemia of chronic kidney disease with thrombocytopenia: secondary to liver failure  4. Hyperkalemia: secondary to spironolactone, chronic kidney disease and hepatorenal syndrome - hold spironolactone  Discussed case with patient's brother who is a cardiologist in [place], Dr. Edwyna Shell. [phone]   LOS: 1 Woodward Klem 10/19/20191:13 PM

## 2017-10-20 LAB — COMPREHENSIVE METABOLIC PANEL
ALT: 13 U/L (ref 0–44)
ANION GAP: 9 (ref 5–15)
AST: 26 U/L (ref 15–41)
Albumin: 2.4 g/dL — ABNORMAL LOW (ref 3.5–5.0)
Alkaline Phosphatase: 232 U/L — ABNORMAL HIGH (ref 38–126)
BUN: 21 mg/dL (ref 8–23)
CALCIUM: 7.7 mg/dL — AB (ref 8.9–10.3)
CO2: 27 mmol/L (ref 22–32)
CREATININE: 1.26 mg/dL — AB (ref 0.61–1.24)
Chloride: 89 mmol/L — ABNORMAL LOW (ref 98–111)
GFR, EST NON AFRICAN AMERICAN: 60 mL/min — AB (ref >60)
GLUCOSE: 296 mg/dL — AB (ref 70–99)
Potassium: 4 mmol/L (ref 3.5–5.1)
SODIUM: 125 mmol/L — AB (ref 135–145)
TOTAL PROTEIN: 5.6 g/dL — AB (ref 6.5–8.1)
Total Bilirubin: 2.4 mg/dL — ABNORMAL HIGH (ref 0.3–1.2)

## 2017-10-20 LAB — GLUCOSE, CAPILLARY
GLUCOSE-CAPILLARY: 322 mg/dL — AB (ref 70–99)
GLUCOSE-CAPILLARY: 352 mg/dL — AB (ref 70–99)
Glucose-Capillary: 347 mg/dL — ABNORMAL HIGH (ref 70–99)
Glucose-Capillary: 331 mg/dL — ABNORMAL HIGH (ref 70–99)

## 2017-10-20 LAB — SODIUM
Sodium: 121 mmol/L — ABNORMAL LOW (ref 135–145)
Sodium: 125 mmol/L — ABNORMAL LOW (ref 135–145)

## 2017-10-20 NOTE — Progress Notes (Signed)
[place] at [facility]                                                                                                                                                                                  Patient Demographics   Rodney Stewart, is a 62 y.o. male, DOB - 08/15/1955, ACZ:[id number]  Admit date - 10/18/2017   Admitting Physician Lance Coon, MD  Outpatient Primary MD for the patient is [name], [name], MD   LOS - 2  Subjective: Patient's lower extremity much less swollen Complains of his abdomen being swollen  Review of Systems:   CONSTITUTIONAL: No documented fever.  Positive fatigue, weakness. No weight gain, no weight loss.  EYES: No blurry or double vision.  ENT: No tinnitus. No postnasal drip. No redness of the oropharynx.  RESPIRATORY: No cough, no wheeze, no hemoptysis. No dyspnea.  CARDIOVASCULAR: No chest pain. No orthopnea. No palpitations. No syncope.  GASTROINTESTINAL: No nausea, no vomiting or diarrhea. No abdominal pain. No melena or hematochezia.  Positive abdominal distention GENITOURINARY: No dysuria or hematuria.  ENDOCRINE: No polyuria or nocturia. No heat or cold intolerance.  HEMATOLOGY: No anemia. No bruising. No bleeding.  INTEGUMENTARY: No rashes. No lesions.  MUSCULOSKELETAL: No arthritis. No swelling. No gout.  NEUROLOGIC: No numbness, tingling, or ataxia. No seizure-type activity.  PSYCHIATRIC: No anxiety. No insomnia. No ADD.    Vitals:   Vitals:   10/19/17 1225 10/19/17 1926 10/20/17 0446 10/20/17 1342  BP: 116/79 139/87 125/84 111/78  Pulse: 93 (!) 103 92 92  Resp: 20 19 16 20   Temp: 98 F (36.7 C) 97.9 F (36.6 C) 98.1 F (36.7 C) 98.3 F (36.8 C)  TempSrc: Oral Oral Oral Oral  SpO2: 100% 97% 98% 96%  Weight:      Height:        Wt Readings from Last 3 Encounters:  10/18/17 79.8 kg  10/15/17 80.3 kg  10/09/17 78.6 kg     Intake/Output Summary (Last 24 hours) at 10/20/2017 1508 Last data  filed at 10/20/2017 1403 Gross per 24 hour  Intake 852.9 ml  Output -  Net 852.9 ml    Physical Exam:   GENERAL:  chronically ill-appearing  HEAD, EYES, EARS, NOSE AND THROAT: Atraumatic, normocephalic. Extraocular muscles are intact. Pupils equal and reactive to light. Sclerae anicteric. No conjunctival injection. No oro-pharyngeal erythema.  NECK: Supple. There is no jugular venous distention. No bruits, no lymphadenopathy, no thyromegaly.  HEART: Regular rate and rhythm,. No murmurs, no rubs, no clicks.  LUNGS: Clear to auscultation bilaterally. No rales or rhonchi. No wheezes.  ABDOMEN: Soft, flat, nontender, abdomen distended. Has good bowel sounds. No  hepatosplenomegaly appreciated.  EXTREMITIES: No evidence of any cyanosis, clubbing, or 2+ peripheral edema.  +2 pedal and radial pulses bilaterally.  NEUROLOGIC: The patient is alert, awake, and oriented x3 with no focal motor or sensory deficits appreciated bilaterally.  SKIN: Moist and warm with no rashes appreciated.  Psych: Not anxious, depressed LN: No inguinal LN enlargement    Antibiotics   Anti-infectives (From admission, onward)   None      Medications   Scheduled Meds: . aspirin EC  81 mg Oral Daily  . enoxaparin (LOVENOX) injection  40 mg Subcutaneous Q24H  . furosemide  60 mg Intravenous Q8H  . gabapentin  200 mg Oral BID  . insulin aspart  0-5 Units Subcutaneous QHS  . insulin aspart  0-9 Units Subcutaneous TID WC  . levothyroxine  25 mcg Oral QAC breakfast  . tamsulosin  0.4 mg Oral QODAY   Continuous Infusions: . albumin human 25 g (10/20/17 1451)   PRN Meds:.ALPRAZolam, morphine injection, ondansetron **OR** ondansetron (ZOFRAN) IV, oxyCODONE, prochlorperazine   Data Review:   Micro Results No results found for this or any previous visit (from the past 240 hour(s)).  Radiology Reports Dg Chest 2 View  Result Date: 10/18/2017 CLINICAL DATA:  Shortness of breath over the past 6 months, worse  over the past 2 days. History of metastatic renal cell carcinoma EXAM: CHEST - 2 VIEW COMPARISON:  CT chest dated August 30, 2017. Chest x-ray dated January 25, 2017. FINDINGS: Normal heart size. Fullness of both hilar regions is unchanged. Small bilateral pleural effusions. No consolidation or pneumothorax. No acute osseous abnormality. IMPRESSION: 1. New small bilateral pleural effusions. 2. Unchanged bilateral hilar fullness corresponding to known hilar lymphadenopathy. Electronically Signed   By: Titus Dubin M.D.   On: 10/18/2017 15:24   Ct Abdomen Pelvis W Contrast  Result Date: 09/20/2017 CLINICAL DATA:  Bilateral lower abdominal pain radiating across the lower abdomen. Patient was released from hospital Wednesday after paracentesis. Patient has had umbilical hernia for years. EXAM: CT ABDOMEN AND PELVIS WITH CONTRAST TECHNIQUE: Multidetector CT imaging of the abdomen and pelvis was performed using the standard protocol following bolus administration of intravenous contrast. CONTRAST:  152mL ISOVUE-300 IOPAMIDOL (ISOVUE-300) INJECTION 61% COMPARISON:  09/13/2017 CT FINDINGS: Lower chest: Normal heart size without pericardial effusion. Redemonstration of dominant pulmonary mass in the left lower lobe measuring approximately 2.7 x 2.4 cm allowing for slice sampling differences in operator dependent imaging. Smaller scattered pulmonary nodules are also present within both lower lobes. Trace right greater than left pleural effusions. Hepatobiliary: Cirrhotic appearance of the liver with heterogeneous somewhat wedge-shaped area of hypo density involving the medial segment of left hepatic lobe but without definable mass. Nondistended gallbladder with layering gallstones. No biliary dilatation. Pancreas: Unremarkable Spleen: Enlarged up to 16.5 cm without focal mass stable since recent. Adrenals/Urinary Tract: Surgically absent left adrenal gland and kidney. The right adrenal gland and right kidney are  unremarkable. Stomach/Bowel: The stomach is decompressed in appearance. The duodenal sweep and ligament of Treitz are normal in position. No small bowel dilatation or obstruction is identified. Mildly edematous appearance of the small bowel likely sympathetic from surrounding ascites. Likewise there is somewhat thickened appearance of the cecum and ascending colon since prior with submucosal fatty change. Possibility of a colitis is not entirely excluded. Centralized loops of small bowel secondary to large volume of ascites. No bowel obstruction. Vascular/Lymphatic: Nonaneurysmal thoracic aorta with atherosclerosis. No adenopathy. Multiple upper abdominal varices. Reproductive: Mildly enlarged prostate. Other: Ventral hernia  containing loculated fluid is again redemonstrated without definite herniation of bowel given lack of more proximal bowel this measures 10.6 x 9.6 x 2.7 cm (transverse by CC by AP dimension) with the mouth of hernia measuring 4.2 x 1.7 cm in transverse by craniocaudad. Previously this hernia was estimated at 9.5 cm transverse by 2.7 cm in thickness. Prominent enhancing omental vessels project into the hernia as before. Moderate to large volume of ascites redemonstrated. Musculoskeletal: Degenerative disc disease L3-4 and L4-5. IMPRESSION: 1. Moderate to large volume of ascites despite recent paracentesis suggests reaccumulation of fluid given similar appearance of the abdomen and pelvis 09/13/2017 (patient had a paracentesis performed on 09/16/2017). 2. Cirrhotic liver with splenomegaly and stigmata of portal hypertension. 3. Slightly wedge shaped perfusion anomaly of the medial segment of left hepatic lobe similar to prior. A mass cannot be entirely excluded. 4. Stable pulmonary nodules with dominant mass in left lower lobe. 5. Large multiloculated fluid containing ventral hernia with herniation of omental vessels as before currently estimated at 10.6 x 9.6 x 2.7 cm. This appears more prominent  than on prior. Electronically Signed   By: Ashley Royalty M.D.   On: 09/20/2017 21:01   US Scrotum W/doppler  Result Date: 09/25/2017 CLINICAL DATA:  Scrotal swelling EXAM: SCROTAL ULTRASOUND DOPPLER ULTRASOUND OF THE TESTICLES TECHNIQUE: Complete ultrasound examination of the testicles, epididymis, and other scrotal structures was performed. Color and spectral Doppler ultrasound were also utilized to evaluate blood flow to the testicles. COMPARISON:  None. FINDINGS: Right testicle Measurements: 4.1 x 2.6 x 2.7 cm. No mass or microlithiasis visualized. Left testicle Measurements: 4.1 x 2.7 x 2.8 cm. No mass or microlithiasis visualized. Right epididymis:  Normal in size and appearance. Left epididymis:  1.1 cm epididymal head cyst or spermatocele. Hydrocele:  Large bilateral hydroceles Varicocele:  None visualized. Pulsed Doppler interrogation of both testes demonstrates normal low resistance arterial and venous waveforms bilaterally. Other: Complex fluid collections are noted in the inguinal canal regions bilaterally with numerous internal septations. These may be related to hernias containing loculated fluid. IMPRESSION: No focal testicular abnormality or evidence of testicular torsion. Large bilateral hydroceles. Complex fluid-filled areas within the inguinal canals bilaterally, likely loculated ascites related to the large volume abdominal ascites seen on prior CT. Electronically Signed   By: Rolm Baptise M.D.   On: 09/25/2017 13:23   Ir Radiologist Eval & Mgmt  Result Date: 10/15/2017 Please refer to notes tab for details about interventional procedure. (Op Note)    CBC Recent Labs  Lab 10/18/17 1003 10/19/17 0359  WBC 6.1 6.7  HGB 11.5* 10.4*  HCT 33.9* 31.4*  PLT 145* 137*  MCV 84.8 85.6  MCH 28.8 28.3  MCHC 33.9 33.1  RDW 18.2* 18.4*    Chemistries  Recent Labs  Lab 10/18/17 1003 10/19/17 0359 10/19/17 1041 10/19/17 1620 10/19/17 2159 10/20/17 0300 10/20/17 0944  NA 121*  122* 122* 120* 123* 125* 121*  K 5.6* 5.3*  --   --   --  4.0  --   CL 86* 88*  --   --   --  89*  --   CO2 24 24  --   --   --  27  --   GLUCOSE 295* 303*  --   --   --  296*  --   BUN 19 22  --   --   --  21  --   CREATININE 1.43* 1.63*  --   --   --  1.26*  --   CALCIUM 7.7* 7.5*  --   --   --  7.7*  --   AST 31  --   --   --   --  26  --   ALT 14  --   --   --   --  13  --   ALKPHOS 289*  --   --   --   --  232*  --   BILITOT 3.7*  --   --   --   --  2.4*  --    ------------------------------------------------------------------------------------------------------------------ estimated creatinine clearance is 65.6 mL/min (A) (by C-G formula based on SCr of 1.26 mg/dL (H)). ------------------------------------------------------------------------------------------------------------------ No results for input(s): HGBA1C in the last 72 hours. ------------------------------------------------------------------------------------------------------------------ No results for input(s): CHOL, HDL, LDLCALC, TRIG, CHOLHDL, LDLDIRECT in the last 72 hours. ------------------------------------------------------------------------------------------------------------------ No results for input(s): TSH, T4TOTAL, T3FREE, THYROIDAB in the last 72 hours.  Invalid input(s): FREET3 ------------------------------------------------------------------------------------------------------------------ No results for input(s): VITAMINB12, FOLATE, FERRITIN, TIBC, IRON, RETICCTPCT in the last 72 hours.  Coagulation profile Recent Labs  Lab 10/18/17 1003  INR 1.45    No results for input(s): DDIMER in the last 72 hours.  Cardiac Enzymes No results for input(s): CKMB, TROPONINI, MYOGLOBIN in the last 168 hours.  Invalid input(s): CK ------------------------------------------------------------------------------------------------------------------ Invalid input(s): [place]  Patient  admitted with weakness worsening swelling hyponatremia  1.  Hyponatremia due to hyp hyper volemia Appreciate nephrology input continue IV Lasix and albumin Follow sodium level closely  2. Cirrhosis ([place]) -avoid hepatotoxins, patient likely has hepatorenal syndrome contributing to his significant fluid overload.  His hypoalbuminemia is causing his fluid to third space.  We will ask radiology to do paracentesis tomorrow  3.   Hypoalbuminemia -continue IV albumin infusions as above 4.  Type 2 diabetes mellitus with diabetic neuropathy, unspecified (HCC) -sliding scale insulin with corresponding glucose checks 5.  CAD (coronary artery disease) -continue home meds 6. CKD (chronic kidney disease) stage 3, GFR 30-59 ml/min (HCC) -at baseline, avoid nephrotoxins and monitor 7.  Hypothyroidism -home dose thyroid replacement  Overall prognosis very poor      Code Status Orders  (From admission, onward)         Start     Ordered   10/19/17 0012  Do not attempt resuscitation (DNR)  Continuous    Question Answer Comment  In the event of cardiac or respiratory ARREST Do not call a "code blue"   In the event of cardiac or respiratory ARREST Do not perform Intubation, CPR, defibrillation or ACLS   In the event of cardiac or respiratory ARREST Use medication by any route, position, wound care, and other measures to relive pain and suffering. May use oxygen, suction and manual treatment of airway obstruction as needed for comfort.      10/19/17 0011        Code Status History    Date Active Date Inactive Code Status Order ID Comments User Context   09/16/2017 1346 09/18/2017 1544 DNR [id number]  Jimmy Footman, NP Inpatient   09/14/2017 0254 09/16/2017 1346 Full Code [id number]  Amelia Jo, MD Inpatient   01/25/2017 1037 01/27/2017 1704 Full Code [id number]  Bettey Costa, MD ED   09/30/2015 1609 10/03/2015 1530 Full Code [id number]  Demetrios Loll, MD Inpatient           Consults  nephrology  DVT Prophylaxis SCD Lab Results  Component Value Date   PLT 137 (L) 10/19/2017  Time Spent in minutes   84min  Greater than 50% of time spent in care coordination and counseling patient regarding the condition and plan of care.   Dustin Flock M.D on 10/20/2017 at 3:08 PM  Between 7am to 6pm - Pager - [phone]  After 6pm go to [email or web address] - Proofreader  [employer]   Office  [phone]

## 2017-10-20 NOTE — Plan of Care (Signed)
Routine nursing care provided. Patient not in any form of distress. Noted for ascites. Due medicines including albumin and lasix administered. Assisted patient to the bathroom. Rest and comforts promoted, safety maintained. Needs attended, will continue to monitor.

## 2017-10-20 NOTE — Progress Notes (Signed)
[facility]  ROUNDING NOTE   Subjective:   Two daughters at bedside.   Na 121 (125) (123)  Objective:  Vital signs in last 24 hours:  Temp:  [97.9 F (36.6 C)-98.1 F (36.7 C)] 98.1 F (36.7 C) (10/20 0446) Pulse Rate:  [92-103] 92 (10/20 0446) Resp:  [16-19] 16 (10/20 0446) BP: (125-139)/(84-87) 125/84 (10/20 0446) SpO2:  [97 %-98 %] 98 % (10/20 0446)  Weight change:  Filed Weights   10/18/17 0947  Weight: 79.8 kg    Intake/Output: I/O last 3 completed shifts: In: 724.9 [P.O.:240; IV Piggyback:484.9] Out: 110 [Urine:110]   Intake/Output this shift:  No intake/output data recorded.  Physical Exam: General: NAD, laying in bed  Head: Normocephalic, atraumatic. Moist oral mucosal membranes  Eyes: Anicteric, PERRL  Neck: Supple, trachea midline  Lungs:  Clear to auscultation  Heart: Regular rate and rhythm  Abdomen:  +ascites , umbilical hernia  Extremities: no peripheral edema.  Neurologic: Nonfocal, moving all four extremities  Skin: No lesions  GU: Scrotal edema    Basic Metabolic Panel: Recent Labs  Lab 10/18/17 1003 10/19/17 0359 10/19/17 1041 10/19/17 1620 10/19/17 2159 10/20/17 0300 10/20/17 0944  NA 121* 122* 122* 120* 123* 125* 121*  K 5.6* 5.3*  --   --   --  4.0  --   CL 86* 88*  --   --   --  89*  --   CO2 24 24  --   --   --  27  --   GLUCOSE 295* 303*  --   --   --  296*  --   BUN 19 22  --   --   --  21  --   CREATININE 1.43* 1.63*  --   --   --  1.26*  --   CALCIUM 7.7* 7.5*  --   --   --  7.7*  --     Liver Function Tests: Recent Labs  Lab 10/18/17 1003 10/20/17 0300  AST 31 26  ALT 14 13  ALKPHOS 289* 232*  BILITOT 3.7* 2.4*  PROT 6.7 5.6*  ALBUMIN 2.0* 2.4*   Recent Labs  Lab 10/18/17 1003  LIPASE 31   Recent Labs  Lab 10/18/17 1003  AMMONIA 17    CBC: Recent Labs  Lab 10/18/17 1003 10/19/17 0359  WBC 6.1 6.7  HGB 11.5* 10.4*  HCT 33.9* 31.4*  MCV 84.8 85.6  PLT 145* 137*    Cardiac  Enzymes: No results for input(s): CKTOTAL, CKMB, CKMBINDEX, TROPONINI in the last 168 hours.  BNP: Invalid input(s): POCBNP  CBG: Recent Labs  Lab 10/19/17 1134 10/19/17 1657 10/19/17 2144 10/20/17 0742 10/20/17 1203  GLUCAP 286* 218* 285* 322* 50*    Microbiology: Results for orders placed or performed during the hospital encounter of 09/13/17  MRSA PCR Screening     Status: None   Collection Time: 09/15/17  8:17 AM  Result Value Ref Range Status   MRSA by PCR NEGATIVE NEGATIVE Final    Comment:        The GeneXpert MRSA Assay (FDA approved for NASAL specimens only), is one component of a comprehensive MRSA colonization surveillance program. It is not intended to diagnose MRSA infection nor to guide or monitor treatment for MRSA infections. Performed at [facility], [address], [place], [place] [address]     Coagulation Studies: Recent Labs    10/18/17 1003  LABPROT 17.5*  INR 1.45    Urinalysis:  Recent Labs    10/18/17 1003  COLORURINE AMBER*  LABSPEC 1.012  PHURINE 5.0  GLUCOSEU 150*  HGBUR NEGATIVE  BILIRUBINUR NEGATIVE  KETONESUR 5*  PROTEINUR NEGATIVE  NITRITE NEGATIVE  LEUKOCYTESUR NEGATIVE      Imaging: Dg Chest 2 View  Result Date: 10/18/2017 CLINICAL DATA:  Shortness of breath over the past 6 months, worse over the past 2 days. History of metastatic renal cell carcinoma EXAM: CHEST - 2 VIEW COMPARISON:  CT chest dated August 30, 2017. Chest x-ray dated January 25, 2017. FINDINGS: Normal heart size. Fullness of both hilar regions is unchanged. Small bilateral pleural effusions. No consolidation or pneumothorax. No acute osseous abnormality. IMPRESSION: 1. New small bilateral pleural effusions. 2. Unchanged bilateral hilar fullness corresponding to known hilar lymphadenopathy. Electronically Signed   By: Titus Dubin M.D.   On: 10/18/2017 15:24     Medications:   . albumin human 25 g (10/20/17 0535)   . aspirin EC   81 mg Oral Daily  . enoxaparin (LOVENOX) injection  40 mg Subcutaneous Q24H  . furosemide  60 mg Intravenous Q8H  . gabapentin  200 mg Oral BID  . insulin aspart  0-5 Units Subcutaneous QHS  . insulin aspart  0-9 Units Subcutaneous TID WC  . levothyroxine  25 mcg Oral QAC breakfast  . tamsulosin  0.4 mg Oral QODAY   ALPRAZolam, morphine injection, ondansetron **OR** ondansetron (ZOFRAN) IV, oxyCODONE, prochlorperazine  Assessment/ Plan:  Mr. Rodney Stewart is a 62 y.o. white male with BPH, chronic kidney disease stage III baseline creatinine 1.5, metastatic renal cell carcinoma status post left nephrectomy 05/2012, diabetes mellitus type 2, hypertension, EtoH, admitted on 10/18/2017 for Hepatorenal syndrome ([place]) [K76.7] Hyperkalemia [E87.5] Hyponatremia [E87.1] Generalized abdominal pain [R10.84]  1.  Hyponatremia: with hypervolemia - Continue furosemide: IV 60mg  q 8 along with IV albumin.  - Fluid restriction - Serial sodium checks.   2.  Chronic kidney disease stage III with proteinuria: baseline Creatinine of 1.5 Chronic kidney disease secondary to solitary kidney, diabetes and liver failure, alcohol abuse.   3.  Anemia of chronic kidney disease with thrombocytopenia: secondary to liver failure. Hemoglobin 10.4, platelets 137  4. Hyperkalemia: secondary to spironolactone, chronic kidney disease and hepatorenal syndrome - hold spironolactone  5. Ascites: scheduled for ultrasound guided large volume paracentesis for tomorrow, 10/21.   Discussed case with patient's brother who is a cardiologist in [place], Dr. Edwyna Shell. [phone]   LOS: 2 Casey Maxfield 10/20/20191:26 PM

## 2017-10-21 ENCOUNTER — Inpatient Hospital Stay: Payer: Medicare HMO

## 2017-10-21 LAB — SODIUM
SODIUM: 127 mmol/L — AB (ref 135–145)
Sodium: 126 mmol/L — ABNORMAL LOW (ref 135–145)
Sodium: 126 mmol/L — ABNORMAL LOW (ref 135–145)
Sodium: 128 mmol/L — ABNORMAL LOW (ref 135–145)
Sodium: 129 mmol/L — ABNORMAL LOW (ref 135–145)

## 2017-10-21 LAB — GLUCOSE, CAPILLARY
GLUCOSE-CAPILLARY: 369 mg/dL — AB (ref 70–99)
GLUCOSE-CAPILLARY: 221 mg/dL — AB (ref 70–99)
Glucose-Capillary: 277 mg/dL — ABNORMAL HIGH (ref 70–99)
Glucose-Capillary: 316 mg/dL — ABNORMAL HIGH (ref 70–99)

## 2017-10-21 MED ORDER — ENSURE ENLIVE PO LIQD
237.0000 mL | Freq: Three times a day (TID) | ORAL | Status: DC
  Administered 2017-10-21 – 2017-10-22 (×3): 237 mL via ORAL

## 2017-10-21 MED ORDER — FOLIC ACID 1 MG PO TABS
1.0000 mg | ORAL_TABLET | Freq: Every day | ORAL | Status: DC
  Administered 2017-10-22: 09:00:00 1 mg via ORAL
  Filled 2017-10-21: qty 1

## 2017-10-21 MED ORDER — INSULIN DETEMIR 100 UNIT/ML ~~LOC~~ SOLN
30.0000 [IU] | Freq: Every day | SUBCUTANEOUS | Status: DC
  Administered 2017-10-21 – 2017-10-22 (×2): 30 [IU] via SUBCUTANEOUS
  Filled 2017-10-21 (×3): qty 0.3

## 2017-10-21 MED ORDER — VITAMIN B-1 100 MG PO TABS
100.0000 mg | ORAL_TABLET | Freq: Every day | ORAL | Status: DC
  Administered 2017-10-22: 100 mg via ORAL
  Filled 2017-10-21: qty 1

## 2017-10-21 MED ORDER — ADULT MULTIVITAMIN W/MINERALS CH
1.0000 | ORAL_TABLET | Freq: Every day | ORAL | Status: DC
  Administered 2017-10-22: 1 via ORAL
  Filled 2017-10-21: qty 1

## 2017-10-21 NOTE — Care Management Important Message (Signed)
Important Message  Patient Details  Name: Rodney Stewart MRN: [id number] Date of Birth: [birth year]   Medicare Important Message Given:  Yes    [name] 10/21/2017, 11:46 AM

## 2017-10-21 NOTE — Progress Notes (Signed)
[place] at [facility]                                                                                                                                                                                  Patient Demographics   Rodney Stewart, is a 62 y.o. male, DOB - Jul 03, 1955, ZGY:[id number]  Admit date - 10/18/2017   Admitting Physician Lance Coon, MD  Outpatient Primary MD for the patient is [name], [name], MD   LOS - 3  Subjective: Patient doing better  Review of Systems:   CONSTITUTIONAL: No documented fever.  Positive fatigue, weakness. No weight gain, no weight loss.  EYES: No blurry or double vision.  ENT: No tinnitus. No postnasal drip. No redness of the oropharynx.  RESPIRATORY: No cough, no wheeze, no hemoptysis. No dyspnea.  CARDIOVASCULAR: No chest pain. No orthopnea. No palpitations. No syncope.  GASTROINTESTINAL: No nausea, no vomiting or diarrhea. No abdominal pain. No melena or hematochezia.  Positive abdominal distention GENITOURINARY: No dysuria or hematuria.  ENDOCRINE: No polyuria or nocturia. No heat or cold intolerance.  HEMATOLOGY: No anemia. No bruising. No bleeding.  INTEGUMENTARY: No rashes. No lesions.  MUSCULOSKELETAL: No arthritis. No swelling. No gout.  NEUROLOGIC: No numbness, tingling, or ataxia. No seizure-type activity.  PSYCHIATRIC: No anxiety. No insomnia. No ADD.    Vitals:   Vitals:   10/21/17 1024 10/21/17 1036 10/21/17 1100 10/21/17 1256  BP: 127/75 120/73 105/75 119/73  Pulse: 79 77 77 80  Resp: 18 18  16   Temp:   97.9 F (36.6 C) 98.2 F (36.8 C)  TempSrc:   Oral Oral  SpO2: 98% 98% 98% 99%  Weight:      Height:        Wt Readings from Last 3 Encounters:  10/18/17 79.8 kg  10/15/17 80.3 kg  10/09/17 78.6 kg     Intake/Output Summary (Last 24 hours) at 10/21/2017 1552 Last data filed at 10/21/2017 1406 Gross per 24 hour  Intake 600 ml  Output -  Net 600 ml    Physical Exam:    GENERAL:  chronically ill-appearing  HEAD, EYES, EARS, NOSE AND THROAT: Atraumatic, normocephalic. Extraocular muscles are intact. Pupils equal and reactive to light. Sclerae anicteric. No conjunctival injection. No oro-pharyngeal erythema.  NECK: Supple. There is no jugular venous distention. No bruits, no lymphadenopathy, no thyromegaly.  HEART: Regular rate and rhythm,. No murmurs, no rubs, no clicks.  LUNGS: Clear to auscultation bilaterally. No rales or rhonchi. No wheezes.  ABDOMEN: Soft, flat, nontender, abdomen distended. Has good bowel sounds. No hepatosplenomegaly appreciated.  EXTREMITIES: No evidence of any cyanosis, clubbing, or 2+ peripheral edema.  +2  pedal and radial pulses bilaterally.  NEUROLOGIC: The patient is alert, awake, and oriented x3 with no focal motor or sensory deficits appreciated bilaterally.  SKIN: Moist and warm with no rashes appreciated.  Psych: Not anxious, depressed LN: No inguinal LN enlargement    Antibiotics   Anti-infectives (From admission, onward)   None      Medications   Scheduled Meds: . aspirin EC  81 mg Oral Daily  . enoxaparin (LOVENOX) injection  40 mg Subcutaneous Q24H  . furosemide  60 mg Intravenous Q8H  . gabapentin  200 mg Oral BID  . insulin aspart  0-5 Units Subcutaneous QHS  . insulin aspart  0-9 Units Subcutaneous TID WC  . insulin detemir  30 Units Subcutaneous Daily  . levothyroxine  25 mcg Oral QAC breakfast  . tamsulosin  0.4 mg Oral QODAY   Continuous Infusions: . albumin human 25 g (10/21/17 1404)   PRN Meds:.ALPRAZolam, morphine injection, ondansetron **OR** ondansetron (ZOFRAN) IV, oxyCODONE, prochlorperazine   Data Review:   Micro Results No results found for this or any previous visit (from the past 240 hour(s)).  Radiology Reports Dg Chest 2 View  Result Date: 10/18/2017 CLINICAL DATA:  Shortness of breath over the past 6 months, worse over the past 2 days. History of metastatic renal cell  carcinoma EXAM: CHEST - 2 VIEW COMPARISON:  CT chest dated August 30, 2017. Chest x-ray dated January 25, 2017. FINDINGS: Normal heart size. Fullness of both hilar regions is unchanged. Small bilateral pleural effusions. No consolidation or pneumothorax. No acute osseous abnormality. IMPRESSION: 1. New small bilateral pleural effusions. 2. Unchanged bilateral hilar fullness corresponding to known hilar lymphadenopathy. Electronically Signed   By: Titus Dubin M.D.   On: 10/18/2017 15:24   US Paracentesis  Result Date: 10/21/2017 INDICATION: Recurrent symptomatic intra-abdominal ascites. Please perform ultrasound-guided paracentesis for therapeutic purposes as indicated. Paracentesis is limited at 5 L. EXAM: ULTRASOUND-GUIDED PARACENTESIS COMPARISON:  CT abdomen pelvis-09/20/2017; multiple previous ultrasound-guided paracenteses, most recently 09/16/2017 yielding 2 L of ascitic fluid. MEDICATIONS: None. COMPLICATIONS: None immediate. TECHNIQUE: Informed written consent was obtained from the patient after a discussion of the risks, benefits and alternatives to treatment. A timeout was performed prior to the initiation of the procedure. Initial ultrasound scanning demonstrates a large amount of ascites within the right lower abdominal quadrant. The right lower abdomen was prepped and draped in the usual sterile fashion. 1% lidocaine with epinephrine was used for local anesthesia. An ultrasound image was saved for documentation purposed. An 8 Fr Safe-T-Centesis catheter was introduced. The paracentesis was performed. The catheter was removed and a dressing was applied. The patient tolerated the procedure well without immediate post procedural complication. FINDINGS: A total of approximately 5 liters of serous fluid was removed. IMPRESSION: Successful ultrasound-guided paracentesis yielding 5 liters of peritoneal fluid. Electronically Signed   By: Sandi Mariscal M.D.   On: 10/21/2017 10:50   US Scrotum  W/doppler  Result Date: 09/25/2017 CLINICAL DATA:  Scrotal swelling EXAM: SCROTAL ULTRASOUND DOPPLER ULTRASOUND OF THE TESTICLES TECHNIQUE: Complete ultrasound examination of the testicles, epididymis, and other scrotal structures was performed. Color and spectral Doppler ultrasound were also utilized to evaluate blood flow to the testicles. COMPARISON:  None. FINDINGS: Right testicle Measurements: 4.1 x 2.6 x 2.7 cm. No mass or microlithiasis visualized. Left testicle Measurements: 4.1 x 2.7 x 2.8 cm. No mass or microlithiasis visualized. Right epididymis:  Normal in size and appearance. Left epididymis:  1.1 cm epididymal head cyst or spermatocele. Hydrocele:  Large bilateral hydroceles Varicocele:  None visualized. Pulsed Doppler interrogation of both testes demonstrates normal low resistance arterial and venous waveforms bilaterally. Other: Complex fluid collections are noted in the inguinal canal regions bilaterally with numerous internal septations. These may be related to hernias containing loculated fluid. IMPRESSION: No focal testicular abnormality or evidence of testicular torsion. Large bilateral hydroceles. Complex fluid-filled areas within the inguinal canals bilaterally, likely loculated ascites related to the large volume abdominal ascites seen on prior CT. Electronically Signed   By: Rolm Baptise M.D.   On: 09/25/2017 13:23   Ir Radiologist Eval & Mgmt  Result Date: 10/15/2017 Please refer to notes tab for details about interventional procedure. (Op Note)    CBC Recent Labs  Lab 10/18/17 1003 10/19/17 0359  WBC 6.1 6.7  HGB 11.5* 10.4*  HCT 33.9* 31.4*  PLT 145* 137*  MCV 84.8 85.6  MCH 28.8 28.3  MCHC 33.9 33.1  RDW 18.2* 18.4*    Chemistries  Recent Labs  Lab 10/18/17 1003 10/19/17 0359  10/20/17 0300 10/20/17 0944 10/20/17 1725 10/21/17 0034 10/21/17 0427 10/21/17 0954  NA 121* 122*   < > 125* 121* 125* 126* 127* 128*  K 5.6* 5.3*  --  4.0  --   --   --   --    --   CL 86* 88*  --  89*  --   --   --   --   --   CO2 24 24  --  27  --   --   --   --   --   GLUCOSE 295* 303*  --  296*  --   --   --   --   --   BUN 19 22  --  21  --   --   --   --   --   CREATININE 1.43* 1.63*  --  1.26*  --   --   --   --   --   CALCIUM 7.7* 7.5*  --  7.7*  --   --   --   --   --   AST 31  --   --  26  --   --   --   --   --   ALT 14  --   --  13  --   --   --   --   --   ALKPHOS 289*  --   --  232*  --   --   --   --   --   BILITOT 3.7*  --   --  2.4*  --   --   --   --   --    < > = values in this interval not displayed.   ------------------------------------------------------------------------------------------------------------------ estimated creatinine clearance is 65.6 mL/min (A) (by C-G formula based on SCr of 1.26 mg/dL (H)). ------------------------------------------------------------------------------------------------------------------ No results for input(s): HGBA1C in the last 72 hours. ------------------------------------------------------------------------------------------------------------------ No results for input(s): CHOL, HDL, LDLCALC, TRIG, CHOLHDL, LDLDIRECT in the last 72 hours. ------------------------------------------------------------------------------------------------------------------ No results for input(s): TSH, T4TOTAL, T3FREE, THYROIDAB in the last 72 hours.  Invalid input(s): FREET3 ------------------------------------------------------------------------------------------------------------------ No results for input(s): VITAMINB12, FOLATE, FERRITIN, TIBC, IRON, RETICCTPCT in the last 72 hours.  Coagulation profile Recent Labs  Lab 10/18/17 1003  INR 1.45    No results for input(s): DDIMER in the last 72 hours.  Cardiac Enzymes No results for input(s): CKMB, TROPONINI, MYOGLOBIN in the last 168 hours.  Invalid input(s):  CK ------------------------------------------------------------------------------------------------------------------ Invalid input(s): POCBNP    Assessment & Plan  Patient admitted with weakness worsening swelling hyponatremia  1.  Hyponatremia due to hyp hyper volemia Appreciate nephrology input Sodium is improved Plan for paracentesis 2. Cirrhosis ([place]) -avoid hepatotoxins, patient likely has hepatorenal syndrome contributing to his significant fluid overload.  His hypoalbuminemia is causing his fluid to third space.  We will ask radiology to do paracentesis tomorrow  3.   Hypoalbuminemia -is post IV albumin 4.  Type 2 diabetes mellitus with diabetic neuropathy, unspecified (HCC) -sliding scale insulin with corresponding glucose checks 5.  CAD (coronary artery disease) -continue home meds 6. CKD (chronic kidney disease) stage 3, GFR 30-59 ml/min (HCC) -at baseline, avoid nephrotoxins and monitor 7.  Hypothyroidism -home dose thyroid replacement  Overall prognosis very poor, possible discharge tomorrow      Code Status Orders  (From admission, onward)         Start     Ordered   10/19/17 0012  Do not attempt resuscitation (DNR)  Continuous    Question Answer Comment  In the event of cardiac or respiratory ARREST Do not call a "code blue"   In the event of cardiac or respiratory ARREST Do not perform Intubation, CPR, defibrillation or ACLS   In the event of cardiac or respiratory ARREST Use medication by any route, position, wound care, and other measures to relive pain and suffering. May use oxygen, suction and manual treatment of airway obstruction as needed for comfort.      10/19/17 0011        Code Status History    Date Active Date Inactive Code Status Order ID Comments User Context   09/16/2017 1346 09/18/2017 1544 DNR [id number]  Jimmy Footman, NP Inpatient   09/14/2017 0254 09/16/2017 1346 Full Code [id number]  Amelia Jo, MD Inpatient   01/25/2017  1037 01/27/2017 1704 Full Code [id number]  Bettey Costa, MD ED   09/30/2015 1609 10/03/2015 1530 Full Code [id number]  Demetrios Loll, MD Inpatient           Consults nephrology  DVT Prophylaxis SCD Lab Results  Component Value Date   PLT 137 (L) 10/19/2017     Time Spent in minutes   60min  Greater than 50% of time spent in care coordination and counseling patient regarding the condition and plan of care.   Dustin Flock M.D on 10/21/2017 at 3:52 PM  Between 7am to 6pm - Pager - [phone]  After 6pm go to [email or web address] - Proofreader  [employer]   Office  [phone]

## 2017-10-21 NOTE — Procedures (Signed)
Pre Procedural Dx: Symptomatic Ascites Post Procedural Dx: Same  Successful US guided paracentesis yielding 5 L of serous ascitic fluid. (Note, additional fluid was seen in the abdomen following the para, however max volume was requested to be 5 L)  EBL: None  Complications: None immediate  Ronny Bacon, MD Pager #: [phone]

## 2017-10-21 NOTE — Progress Notes (Signed)
Initial Nutrition Assessment  DOCUMENTATION CODES:   Severe malnutrition in context of chronic illness  INTERVENTION:  Provide Ensure Enlive po TID between meals, each supplement provides 350 kcal and 20 grams of protein.  Provide MVI daily, folic acid 1 mg daily, thiamine 100 mg daily in setting of hx of EtOH abuse and increased risk for deficiency in setting of malnutrition.  Encouraged intake of small, frequent meals throughout the day with calorie- and protein-dense foods. Provided patient with logs to trach intake and blood sugar that he can take to the outpatient MD who manages his diabetes. Encouraged patient to try to "schedule" intake so it is spaced throughout the day for the best glycemic control. Encouraged intake of high-calorie, high-protein frozen meals at home. Also discussed frozen peanut butter and jelly sandwiches that patient can try. RD suspects patient is not consuming enough at meals to exceed sodium restriction recommended in cirrhosis. Will add note in [facility] to not add salt packets on trays and discussed with patient to not add salt to food.  Monitor magnesium, potassium, and phosphorus daily for at least 3 days, MD to replete as needed, as pt is at risk for refeeding syndrome given severe malnutrition, hx EtOH abuse.  NUTRITION DIAGNOSIS:   Severe Malnutrition related to chronic illness(stage IV RCC with lung metastases on nivolumab, CKD, cirrhosis, ascites, EtOH abuse) as evidenced by severe fat depletion, severe muscle depletion, 19.7% weight loss over 7 months  GOAL:   Patient will meet greater than or equal to 90% of their needs  MONITOR:   PO intake, Supplement acceptance, Labs, Weight trends, I & O's  REASON FOR ASSESSMENT:   Consult Assessment of nutrition requirement/status, Diet education  ASSESSMENT:   62 year old male with PMHx of EtOH abuse, HTN, stage IV RCC of left kidney with bilateral lung metastases s/p left nephrectomy in 2014 on  nivolumab, CKD stage III, BPH, gout, DM, neuropathy, GERD, alcohol related cirrhosis, ascites requiring periodic paracentesis, periumbilical hernia who is admitted with hyponatremia and cirrhosis with ascites.   -Patient s/p US guided paracentesis today which yielded 5 L of fluid. Additional fluid was seen in abdomen following paracentesis, but max volume requested was 5 L.  Met with patient and his friend at bedside. Patient wanted his friend to stay during nutrition assessment and gave permission to proceed with assessment. Patient is known to this RD from an admission in 09/15/2017 (met with both of his daughters at that time). Patient reports he still has a very poor appetite. He lives alone and is unable to prepare meals for himself. He usually skips breakfast. For lunch he may have a burger, taco, or cereal. For dinner he usually only eats some vegetables. His daughters have bought him a protein powder that he mixes with milk and provides a total of approximately 1000 kcal and 30 grams of protein. He reports drinking one of these shakes each day. Patient did not discuss EtOH intake with his friend present, but when RD met with he and his daughters last month the daughters reported that he drinks beer and Mike[employer] hard lemonade throughout the day, which fills him up and further decreases his appetite. Patient reports his blood sugars have been very difficult to control. He reports they vary from being too low to as high as 400 or 500. RD suspects this is related to patient's unpredictable intake day-to-day and EtOH use if he is still drinking EtOH. Patient's friend was taking notes during assessment with RD and is  going to discuss with daughters when they arrive.  UBW was 180 lbs. Patient was 182.4 lbs on 02/19/2017. On 09/15/2017 he weighed 66.4 kg (146.4 lbs). That is a weight loss of 36 lbs (19.7% body weight) over 7 months, which is significant for time frame. RD obtained bed scale weight today after  paracentesis of 68.1 kg (150.13 lbs), but this is still likely falsely elevated as there is still ascites present.  Medications reviewed and include: Lasix 60 mg Q8hrs IV, gabapentin, Novolog 0-9 units TID, Novolog 0-5 units QHS, Levemir 30 units daily, levothyroxine, Flomax, human albumin 25 grams Q8hrs IV.  Labs reviewed: CBG 277-369 past 24 hrs. Most recent sodium today was 126. On 10/20 Chloride 89, Creatinine 1.26.  NUTRITION - FOCUSED PHYSICAL EXAM:    Most Recent Value  Orbital Region  Severe depletion  Upper Arm Region  Severe depletion  Thoracic and Lumbar Region  Unable to assess  Buccal Region  Severe depletion  Temple Region  Severe depletion  Clavicle Bone Region  Severe depletion  Clavicle and Acromion Bone Region  Severe depletion  Scapular Bone Region  Severe depletion  Dorsal Hand  Severe depletion  Patellar Region  Unable to assess  Anterior Thigh Region  Unable to assess  Posterior Calf Region  Unable to assess  Edema (RD Assessment)  Moderate  Hair  Reviewed  Eyes  Reviewed  Mouth  Reviewed  Skin  Reviewed  Nails  Reviewed     Diet Order:   Diet Order            Diet regular Room service appropriate? Yes; Fluid consistency: Thin  Diet effective now              EDUCATION NEEDS:   Education needs have been addressed  Skin:  Skin Assessment: Reviewed RN Assessment  Last BM:  10/20/2017  Height:   Ht Readings from Last 1 Encounters:  10/18/17 5' 11"  (1.803 m)    Weight:   Wt Readings from Last 1 Encounters:  10/21/17 68.1 kg    Ideal Body Weight:  78.2 kg  BMI:  Body mass index is 20.94 kg/m.  Estimated Nutritional Needs:   Kcal:  0768-0881 (MSJ x 1.3-1.5)  Protein:  100-115 grams (1.5-1.7 grams/kg)  Fluid:  1.2-1.5 L/day  Willey Blade, MS, RD, LDN Office: [phone] Pager: [phone] After Hours/Weekend Pager: [phone]

## 2017-10-21 NOTE — Progress Notes (Signed)
Inpatient Diabetes Program Recommendations  AACE/ADA: New Consensus Statement on Inpatient Glycemic Control (2019)  Target Ranges:  Prepandial:   less than 140 mg/dL      Peak postprandial:   less than 180 mg/dL (1-2 hours)      Critically ill patients:  140 - 180 mg/dL   Results for Rodney Stewart, Rodney Stewart (MRN [id number]) as of 10/21/2017 08:57  Ref. Range 10/20/2017 07:42 10/20/2017 12:03 10/20/2017 17:43 10/20/2017 21:15 10/21/2017 07:44  Glucose-Capillary Latest Ref Range: 70 - 99 mg/dL 322 (H) 347 (H) 331 (H) 352 (H) 369 (H)   Review of Glycemic Control  Diabetes history: DM2 Outpatient Diabetes medications: Levemir 35 units daily, Novolog 0-12 units TID with meals Current orders for Inpatient glycemic control: Novolog 0-9 units TID with meals, Novolog 0-5 units QHS  Inpatient Diabetes Program Recommendations:  Insulin - Basal: Please consider ordering Levemir 30 units daily (to start now). Correction (SSI): Please consider increasing Novolog correction to Moderate (0-15 units) and change frequency to Q4H if patient will remain NPO.  Thanks, Barnie Alderman, RN, MSN, CDE Diabetes Coordinator Inpatient Diabetes Program [phone] (Team Pager from 8am to 5pm)

## 2017-10-21 NOTE — Progress Notes (Signed)
[facility]  ROUNDING NOTE   Subjective:  Sodium remains low at 126. Highest sodium over the past 24 hours was 128.   Objective:  Vital signs in last 24 hours:  Temp:  [97.7 F (36.5 C)-98.5 F (36.9 C)] 98.2 F (36.8 C) (10/21 1256) Pulse Rate:  [77-89] 80 (10/21 1256) Resp:  [16-19] 16 (10/21 1256) BP: (105-130)/(73-96) 119/73 (10/21 1256) SpO2:  [96 %-100 %] 99 % (10/21 1256) Weight:  [68.1 kg] 68.1 kg (10/21 1708)  Weight change:  Filed Weights   10/18/17 0947 10/21/17 1708  Weight: 79.8 kg 68.1 kg    Intake/Output: I/O last 3 completed shifts: In: 900 [P.O.:900] Out: -    Intake/Output this shift:  Total I/O In: 120 [P.O.:120] Out: -   Physical Exam: General: NAD, laying in bed  Head: Normocephalic, atraumatic. Moist oral mucosal membranes  Eyes: Anicteric  Neck: Supple, trachea midline  Lungs:  Clear to auscultation  Heart: Regular rate and rhythm  Abdomen:  +ascites , umbilical hernia  Extremities: no peripheral edema.  Neurologic: Nonfocal, moving all four extremities  Skin: No lesions  GU: Scrotal edema    Basic Metabolic Panel: Recent Labs  Lab 10/18/17 1003 10/19/17 0359  10/20/17 0300  10/20/17 1725 10/21/17 0034 10/21/17 0427 10/21/17 0954 10/21/17 1615  NA 121* 122*   < > 125*   < > 125* 126* 127* 128* 126*  K 5.6* 5.3*  --  4.0  --   --   --   --   --   --   CL 86* 88*  --  89*  --   --   --   --   --   --   CO2 24 24  --  27  --   --   --   --   --   --   GLUCOSE 295* 303*  --  296*  --   --   --   --   --   --   BUN 19 22  --  21  --   --   --   --   --   --   CREATININE 1.43* 1.63*  --  1.26*  --   --   --   --   --   --   CALCIUM 7.7* 7.5*  --  7.7*  --   --   --   --   --   --    < > = values in this interval not displayed.    Liver Function Tests: Recent Labs  Lab 10/18/17 1003 10/20/17 0300  AST 31 26  ALT 14 13  ALKPHOS 289* 232*  BILITOT 3.7* 2.4*  PROT 6.7 5.6*  ALBUMIN 2.0* 2.4*   Recent Labs   Lab 10/18/17 1003  LIPASE 31   Recent Labs  Lab 10/18/17 1003  AMMONIA 17    CBC: Recent Labs  Lab 10/18/17 1003 10/19/17 0359  WBC 6.1 6.7  HGB 11.5* 10.4*  HCT 33.9* 31.4*  MCV 84.8 85.6  PLT 145* 137*    Cardiac Enzymes: No results for input(s): CKTOTAL, CKMB, CKMBINDEX, TROPONINI in the last 168 hours.  BNP: Invalid input(s): POCBNP  CBG: Recent Labs  Lab 10/20/17 1743 10/20/17 2115 10/21/17 0744 10/21/17 1139 10/21/17 1656  GLUCAP 331* 352* 369* 277* 316*    Microbiology: Results for orders placed or performed during the hospital encounter of 09/13/17  MRSA PCR Screening     Status: None  Collection Time: 09/15/17  8:17 AM  Result Value Ref Range Status   MRSA by PCR NEGATIVE NEGATIVE Final    Comment:        The GeneXpert MRSA Assay (FDA approved for NASAL specimens only), is one component of a comprehensive MRSA colonization surveillance program. It is not intended to diagnose MRSA infection nor to guide or monitor treatment for MRSA infections. Performed at [facility], [address] [place], [place] [address]     Coagulation Studies: No results for input(s): LABPROT, INR in the last 72 hours.  Urinalysis: No results for input(s): COLORURINE, LABSPEC, PHURINE, GLUCOSEU, HGBUR, BILIRUBINUR, KETONESUR, PROTEINUR, UROBILINOGEN, NITRITE, LEUKOCYTESUR in the last 72 hours.  Invalid input(s): APPERANCEUR    Imaging: US Paracentesis  Result Date: 10/21/2017 INDICATION: Recurrent symptomatic intra-abdominal ascites. Please perform ultrasound-guided paracentesis for therapeutic purposes as indicated. Paracentesis is limited at 5 L. EXAM: ULTRASOUND-GUIDED PARACENTESIS COMPARISON:  CT abdomen pelvis-09/20/2017; multiple previous ultrasound-guided paracenteses, most recently 09/16/2017 yielding 2 L of ascitic fluid. MEDICATIONS: None. COMPLICATIONS: None immediate. TECHNIQUE: Informed written consent was obtained from the patient  after a discussion of the risks, benefits and alternatives to treatment. A timeout was performed prior to the initiation of the procedure. Initial ultrasound scanning demonstrates a large amount of ascites within the right lower abdominal quadrant. The right lower abdomen was prepped and draped in the usual sterile fashion. 1% lidocaine with epinephrine was used for local anesthesia. An ultrasound image was saved for documentation purposed. An 8 Fr Safe-T-Centesis catheter was introduced. The paracentesis was performed. The catheter was removed and a dressing was applied. The patient tolerated the procedure well without immediate post procedural complication. FINDINGS: A total of approximately 5 liters of serous fluid was removed. IMPRESSION: Successful ultrasound-guided paracentesis yielding 5 liters of peritoneal fluid. Electronically Signed   By: Sandi Mariscal M.D.   On: 10/21/2017 10:50     Medications:   . albumin human 25 g (10/21/17 1404)   . aspirin EC  81 mg Oral Daily  . enoxaparin (LOVENOX) injection  40 mg Subcutaneous Q24H  . feeding supplement (ENSURE ENLIVE)  237 mL Oral TID BM  . [START ON 29/52/8413] folic acid  1 mg Oral Daily  . furosemide  60 mg Intravenous Q8H  . gabapentin  200 mg Oral BID  . insulin aspart  0-5 Units Subcutaneous QHS  . insulin aspart  0-9 Units Subcutaneous TID WC  . insulin detemir  30 Units Subcutaneous Daily  . levothyroxine  25 mcg Oral QAC breakfast  . [START ON 10/22/2017] multivitamin with minerals  1 tablet Oral Daily  . tamsulosin  0.4 mg Oral QODAY  . [START ON 10/22/2017] thiamine  100 mg Oral Daily   ALPRAZolam, morphine injection, ondansetron **OR** ondansetron (ZOFRAN) IV, oxyCODONE, prochlorperazine  Assessment/ Plan:  Mr. Rodney Stewart is a 63 y.o. white male with BPH, chronic kidney disease stage III baseline creatinine 1.5, metastatic renal cell carcinoma status post left nephrectomy 05/2012, diabetes mellitus type 2, hypertension,  EtoH, admitted on 10/18/2017 for Hepatorenal syndrome ([place]) [K76.7] Hyperkalemia [E87.5] Hyponatremia [E87.1] Generalized abdominal pain [R10.84]  1.  Hyponatremia: with hypervolemia -Serum sodium at the moment is 126.  Better than admission.  Continue Lasix for now.  Hesitant to administer tolvaptan given elevated bilirubin.  2.  Chronic kidney disease stage III with proteinuria: baseline Creatinine of 1.5 Chronic kidney disease secondary to solitary kidney, diabetes and liver failure, alcohol abuse.  Renal function appears improved at the moment.  3.  Anemia of chronic kidney disease with thrombocytopenia: secondary to liver failure.  Continue to periodically monitor CBC.  No indication for Procrit.  4. Hyperkalemia: secondary to spironolactone, chronic kidney disease and hepatorenal syndrome -Potassium down to 4.0.  Continue to hold spironolactone at this time.  5. Ascites: Patient underwent paracentesis removing 5 L of ascites today.    LOS: 3 Rodney Stewart 10/21/20195:31 PM

## 2017-10-22 LAB — POTASSIUM: Potassium: 2.9 mmol/L — ABNORMAL LOW (ref 3.5–5.1)

## 2017-10-22 LAB — GLUCOSE, CAPILLARY
GLUCOSE-CAPILLARY: 138 mg/dL — AB (ref 70–99)
Glucose-Capillary: 190 mg/dL — ABNORMAL HIGH (ref 70–99)

## 2017-10-22 LAB — SODIUM
SODIUM: 129 mmol/L — AB (ref 135–145)
Sodium: 130 mmol/L — ABNORMAL LOW (ref 135–145)
Sodium: 129 mmol/L — ABNORMAL LOW (ref 135–145)

## 2017-10-22 LAB — CREATININE, SERUM
Creatinine, Ser: 1.08 mg/dL (ref 0.61–1.24)
GFR calc Af Amer: >60 mL/min (ref >60)
GFR calc non Af Amer: >60 mL/min (ref >60)

## 2017-10-22 LAB — MAGNESIUM: Magnesium: 1.1 mg/dL — ABNORMAL LOW (ref 1.7–2.4)

## 2017-10-22 LAB — PHOSPHORUS: Phosphorus: 2.8 mg/dL (ref 2.5–4.6)

## 2017-10-22 LAB — AMMONIA: Ammonia: 40 umol/L — ABNORMAL HIGH (ref 9–35)

## 2017-10-22 LAB — BUN: BUN: 15 mg/dL (ref 8–23)

## 2017-10-22 MED ORDER — ACETAMINOPHEN 325 MG PO TABS: 650.0000 mg | ORAL_TABLET | Freq: Four times a day (QID) | ORAL | Status: DC | PRN

## 2017-10-22 MED ORDER — LACTULOSE 10 GM/15ML PO SOLN
30.0000 g | Freq: Every day | ORAL | Status: DC
  Administered 2017-10-22: 16:00:00 30 g via ORAL
  Filled 2017-10-22: qty 60

## 2017-10-22 MED ORDER — POTASSIUM CHLORIDE CRYS ER 20 MEQ PO TBCR
40.0000 meq | EXTENDED_RELEASE_TABLET | Freq: Four times a day (QID) | ORAL | Status: AC
  Administered 2017-10-22 (×2): 40 meq via ORAL
  Filled 2017-10-22 (×2): qty 2

## 2017-10-22 MED ORDER — POTASSIUM CHLORIDE CRYS ER 20 MEQ PO TBCR: 40.0000 meq | EXTENDED_RELEASE_TABLET | Freq: Every day | ORAL | Status: DC

## 2017-10-22 MED ORDER — MAGNESIUM SULFATE 4 GM/100ML IV SOLN
4.0000 g | Freq: Once | INTRAVENOUS | Status: AC
  Administered 2017-10-22: 4 g via INTRAVENOUS
  Filled 2017-10-22: qty 100

## 2017-10-22 MED ORDER — FUROSEMIDE 20 MG PO TABS: 40.0000 mg | ORAL_TABLET | Freq: Every day | ORAL | 0 refills | Status: DC

## 2017-10-22 MED ORDER — LACTULOSE 10 GM/15ML PO SOLN: 30.0000 g | Freq: Every day | ORAL | 0 refills | Status: DC

## 2017-10-22 MED ORDER — INSULIN DETEMIR 100 UNIT/ML FLEXPEN: 30.0000 [IU] | PEN_INJECTOR | Freq: Every day | SUBCUTANEOUS | 12 refills | Status: DC

## 2017-10-22 MED ORDER — POTASSIUM CHLORIDE CRYS ER 20 MEQ PO TBCR: 20.0000 meq | EXTENDED_RELEASE_TABLET | Freq: Every day | ORAL | 0 refills | Status: DC

## 2017-10-22 MED ORDER — POLYVINYL ALCOHOL 1.4 % OP SOLN
1.0000 [drp] | OPHTHALMIC | Status: DC | PRN
  Filled 2017-10-22: qty 15

## 2017-10-22 NOTE — Progress Notes (Signed)
Pt with multiple complaints during the night; in regards to his condition. He said that he is scheduled for chemo tomorrow and hopes that he will be able to receive chemo while he is here. He also said that he needs his Flomax that he has not had it. Looked back at the [facility] and pt received Flomax yesterday around 1430. Furthermore, pt with anxiety and received Xanax but he wanted this writer to give him another one in a short time. Explained to patient that it was too soon that he is only to get it twice daily as needed. Pt up to bathroom frequently with assist.  Notified MD this am- pt K+ 2.9 and Mag 1.1. Dr Marcille Blanco said since pt did not have any results for his kidney function he was not going to correct- he ordered a BUN/CR. Awaiting results. Pt also, being followed by Nephrology. Bandaid to right abdomen clean, dry and intact from paracentesis on yesterday.

## 2017-10-22 NOTE — Discharge Summary (Signed)
Rodney Stewart at [facility], 62 y.o., DOB 1955-11-25, MRN [id number]. Admission date: 10/18/2017 Discharge Date 10/22/2017 Primary MD Rodney Stewart, Rodney Glow, MD Admitting Physician Rodney Coon, MD  Admission Diagnosis  Hepatorenal syndrome ([place]) [K76.7] Hyperkalemia [E87.5] Hyponatremia [E87.1] Generalized abdominal pain [R10.84]  Discharge Diagnosis   Principal Problem: Hyponatremia due to hypervolemia Cirrhosis of the liver Hypoalbuminemia Chronic kidney disease stage III Type 2 diabetes mellitus with diabetic neuropathy, unspecified (HCC) CAD (coronary artery disease) Recurrent ascites Hypothyroidism      Hospital Course Patient 62 year old with cirrhosis of the liver presented to the hospital with complaint of generalized weakness lower extremity swelling in the ER was noted to have hyponatremia.  Patient was seen by nephrology and started on IV Lasix drip and given IV albumin.  With these interventions patient's swelling is much improved.  He also had ascites which was drained.  Patient's sodium also is much improved.  Patient may need recurrent paracentesis scheduled his primary care provider in conjunction with GI needs to arrange this with radiology.            Consults  None  Significant Tests:  See full reports for all details     Dg Chest 2 View  Result Date: 10/18/2017 CLINICAL DATA:  Shortness of breath over the past 6 months, worse over the past 2 days. History of metastatic renal cell carcinoma EXAM: CHEST - 2 VIEW COMPARISON:  CT chest dated August 30, 2017. Chest x-ray dated January 25, 2017. FINDINGS: Normal heart size. Fullness of both hilar regions is unchanged. Small bilateral pleural effusions. No consolidation or pneumothorax. No acute osseous abnormality. IMPRESSION: 1. New small bilateral pleural effusions. 2. Unchanged bilateral hilar fullness corresponding to known hilar lymphadenopathy. Electronically  Signed   By: Rodney Stewart M.D.   On: 10/18/2017 15:24   US Paracentesis  Result Date: 10/21/2017 INDICATION: Recurrent symptomatic intra-abdominal ascites. Please perform ultrasound-guided paracentesis for therapeutic purposes as indicated. Paracentesis is limited at 5 L. EXAM: ULTRASOUND-GUIDED PARACENTESIS COMPARISON:  CT abdomen pelvis-09/20/2017; multiple previous ultrasound-guided paracenteses, most recently 09/16/2017 yielding 2 L of ascitic fluid. MEDICATIONS: None. COMPLICATIONS: None immediate. TECHNIQUE: Informed written consent was obtained from the patient after a discussion of the risks, benefits and alternatives to treatment. A timeout was performed prior to the initiation of the procedure. Initial ultrasound scanning demonstrates a large amount of ascites within the right lower abdominal quadrant. The right lower abdomen was prepped and draped in the usual sterile fashion. 1% lidocaine with epinephrine was used for local anesthesia. An ultrasound image was saved for documentation purposed. An 8 Fr Safe-T-Centesis catheter was introduced. The paracentesis was performed. The catheter was removed and a dressing was applied. The patient tolerated the procedure well without immediate post procedural complication. FINDINGS: A total of approximately 5 liters of serous fluid was removed. IMPRESSION: Successful ultrasound-guided paracentesis yielding 5 liters of peritoneal fluid. Electronically Signed   By: Rodney Stewart M.D.   On: 10/21/2017 10:50   US Scrotum W/doppler  Result Date: 09/25/2017 CLINICAL DATA:  Scrotal swelling EXAM: SCROTAL ULTRASOUND DOPPLER ULTRASOUND OF THE TESTICLES TECHNIQUE: Complete ultrasound examination of the testicles, epididymis, and other scrotal structures was performed. Color and spectral Doppler ultrasound were also utilized to evaluate blood flow to the testicles. COMPARISON:  None. FINDINGS: Right testicle Measurements: 4.1 x 2.6 x 2.7 cm. No mass or microlithiasis  visualized. Left testicle Measurements: 4.1 x 2.7 x 2.8 cm. No mass or microlithiasis visualized. Right epididymis:  Normal in size and appearance. Left epididymis:  1.1 cm epididymal head cyst or spermatocele. Hydrocele:  Large bilateral hydroceles Varicocele:  None visualized. Pulsed Doppler interrogation of both testes demonstrates normal low resistance arterial and venous waveforms bilaterally. Other: Complex fluid collections are noted in the inguinal canal regions bilaterally with numerous internal septations. These may be related to hernias containing loculated fluid. IMPRESSION: No focal testicular abnormality or evidence of testicular torsion. Large bilateral hydroceles. Complex fluid-filled areas within the inguinal canals bilaterally, likely loculated ascites related to the large volume abdominal ascites seen on prior CT. Electronically Signed   By: Rodney Stewart M.D.   On: 09/25/2017 13:23   Ir Radiologist Eval & Mgmt  Result Date: 10/15/2017 Please refer to notes tab for details about interventional procedure. (Op Note)      Today   Subjective:   Rodney Stewart patient doing much better  Objective:   Blood pressure 104/70, pulse 97, temperature 97.9 F (36.6 C), temperature source Oral, resp. rate 16, height 5\' 11"  (1.803 m), weight 68.1 kg, SpO2 97 %.  .  Intake/Output Summary (Last 24 hours) at 10/22/2017 1558 Last data filed at 10/22/2017 1008 Gross per 24 hour  Intake 851.67 ml  Output -  Net 851.67 ml    Exam VITAL SIGNS: Blood pressure 104/70, pulse 97, temperature 97.9 F (36.6 C), temperature source Oral, resp. rate 16, height 5\' 11"  (1.803 m), weight 68.1 kg, SpO2 97 %.  GENERAL:  62 y.o.-year-old patient lying in the bed with no acute distress.  EYES: Pupils equal, round, reactive to light and accommodation. No scleral icterus. Extraocular muscles intact.  HEENT: Head atraumatic, normocephalic. Oropharynx and nasopharynx clear.  NECK:  Supple, no jugular venous  distention. No thyroid enlargement, no tenderness.  LUNGS: Normal breath sounds bilaterally, no wheezing, rales,rhonchi or crepitation. No use of accessory muscles of respiration.  CARDIOVASCULAR: S1, S2 normal. No murmurs, rubs, or gallops.  ABDOMEN: Soft, nontender, nondistended. Bowel sounds present. No organomegaly or mass.  EXTREMITIES: No pedal edema, cyanosis, or clubbing.  NEUROLOGIC: Cranial nerves II through XII are intact. Muscle strength 5/5 in all extremities. Sensation intact. Gait not checked.  PSYCHIATRIC: The patient is alert and oriented x 3.  SKIN: No obvious rash, lesion, or ulcer.   Data Review     CBC w Diff:  Lab Results  Component Value Date   WBC 6.7 10/19/2017   HGB 10.4 (L) 10/19/2017   HGB 8.2 (L) 04/28/2012   HCT 31.4 (L) 10/19/2017   HCT 26.1 (L) 04/28/2012   PLT 137 (L) 10/19/2017   PLT 357 04/28/2012   LYMPHOPCT 10 10/09/2017   LYMPHOPCT 13.3 04/28/2012   MONOPCT 11 10/09/2017   MONOPCT 10.3 04/28/2012   EOSPCT 2 10/09/2017   EOSPCT 2.2 04/28/2012   BASOPCT 0 10/09/2017   BASOPCT 0.7 04/28/2012   CMP:  Lab Results  Component Value Date   NA 129 (L) 10/22/2017   NA 121 (L) 09/24/2017   NA 135 (L) 04/28/2012   K 2.9 (L) 10/22/2017   K 5.0 04/28/2012   CL 89 (L) 10/20/2017   CL 101 04/28/2012   CO2 27 10/20/2017   CO2 26 04/28/2012   BUN 15 10/22/2017   BUN 37 (H) 09/24/2017   BUN 13 04/28/2012   CREATININE 1.08 10/22/2017   CREATININE 1.49 (H) 04/28/2012   PROT 5.6 (L) 10/20/2017   PROT 5.6 (L) 09/23/2017   PROT 8.4 (H) 04/28/2012   ALBUMIN 2.4 (L) 10/20/2017   ALBUMIN  2.8 (L) 09/23/2017   ALBUMIN 3.1 (L) 04/28/2012   BILITOT 2.4 (H) 10/20/2017   BILITOT 3.9 (H) 09/23/2017   BILITOT 0.8 04/28/2012   ALKPHOS 232 (H) 10/20/2017   ALKPHOS 181 (H) 04/28/2012   AST 26 10/20/2017   AST 25 04/28/2012   ALT 13 10/20/2017   ALT 18 04/28/2012  .  Micro Results No results found for this or any previous visit (from the past 240  hour(s)).      Code Status Orders  (From admission, onward)         Start     Ordered   10/19/17 0012  Do not attempt resuscitation (DNR)  Continuous    Question Answer Comment  In the event of cardiac or respiratory ARREST Do not call a "code blue"   In the event of cardiac or respiratory ARREST Do not perform Intubation, CPR, defibrillation or ACLS   In the event of cardiac or respiratory ARREST Use medication by any route, position, wound care, and other measures to relive pain and suffering. May use oxygen, suction and manual treatment of airway obstruction as needed for comfort.      10/19/17 0011        Code Status History    Date Active Date Inactive Code Status Order ID Comments User Context   09/16/2017 1346 09/18/2017 1544 DNR [id number]  Jimmy Footman, NP Inpatient   09/14/2017 0254 09/16/2017 1346 Full Code [id number]  Amelia Jo, MD Inpatient   01/25/2017 1037 01/27/2017 1704 Full Code [id number]  Bettey Costa, MD ED   09/30/2015 1609 10/03/2015 1530 Full Code [id number]  Demetrios Loll, MD Inpatient          Follow-up Information    Rodney Stewart, Rodney Glow, MD Follow up in 6 day(s).   Specialty:  Internal Medicine Contact information: [place] [address] [phone]           Discharge Medications   Allergies as of 10/22/2017   No Known Allergies     Medication List    TAKE these medications   allopurinol 100 MG tablet Commonly known as:  ZYLOPRIM Take 1 tablet (100 mg total) by mouth daily.   ALPRAZolam 1 MG tablet Commonly known as:  XANAX Take 1 mg by mouth 2 (two) times daily as needed for anxiety or sleep.   aspirin EC 81 MG tablet Take 81 mg by mouth daily.   feeding supplement (ENSURE ENLIVE) Liqd Take 237 mLs by mouth 3 (three) times daily between meals.   furosemide 20 MG tablet Commonly known as:  LASIX Take 2 tablets (40 mg total) by mouth daily. In am What changed:  how much to take   gabapentin  100 MG capsule Commonly known as:  NEURONTIN Take 2 capsules (200 mg total) by mouth 3 (three) times daily. What changed:  when to take this   insulin aspart 100 UNIT/ML FlexPen Commonly known as:  NOVOLOG Before meals 131-180 2 units, 181-240 4 units, 241-300 6 units, 301-350 8 units, 351-400 10 units, >400 12 units   Insulin Detemir 100 UNIT/ML Pen Commonly known as:  LEVEMIR Inject 30 Units into the skin daily at 10 pm. What changed:  how much to take   Insulin Pen Needle 32G X 4 MM Misc 1 Device by Does not apply route 2 (two) times daily. E11.9   lactulose 10 GM/15ML solution Commonly known as:  CHRONULAC Take 45 mLs (30 g total) by mouth daily.   levothyroxine 25 MCG  tablet Commonly known as:  SYNTHROID, LEVOTHROID Take 1 tablet (25 mcg total) by mouth daily before breakfast. 30 minutes   lovastatin 20 MG tablet Commonly known as:  MEVACOR Take 1 tablet (20 mg total) by mouth daily at 6 PM.   magic mouthwash w/lidocaine Soln Take 5 mLs by mouth 3 (three) times daily as needed for mouth pain. Swish and spit   ONE TOUCH ULTRA TEST test strip Generic drug:  glucose blood Bid   oxyCODONE 5 MG immediate release tablet Commonly known as:  Oxy IR/ROXICODONE Take 1 tablet (5 mg total) by mouth every 6 (six) hours as needed for severe pain or breakthrough pain.   potassium chloride SA 20 MEQ tablet Commonly known as:  K-DUR,KLOR-CON Take 1 tablet (20 mEq total) by mouth daily for 3 days.   tamsulosin 0.4 MG Caps capsule Commonly known as:  FLOMAX Take 1 capsule (0.4 mg total) by mouth every other day. With supper   valACYclovir 1000 MG tablet Commonly known as:  VALTREX Take 1 tablet (1,000 mg total) by mouth 2 (two) times daily. X 5-10 days with food          Total Time in preparing paper work, data evaluation and todays exam - 35 minutes  Dustin Flock M.D on 10/22/2017 at 3:58 PM [place]  [phone]

## 2017-10-22 NOTE — Progress Notes (Signed)
[facility]  ROUNDING NOTE   Subjective:  Serum sodium is improved up to 130 today. Patient appears to be in better spirits.   Objective:  Vital signs in last 24 hours:  Temp:  [97.6 F (36.4 C)-98.2 F (36.8 C)] 97.6 F (36.4 C) (10/22 0453) Pulse Rate:  [78-84] 78 (10/22 0453) Resp:  [16-20] 19 (10/22 0453) BP: (110-119)/(70-74) 110/74 (10/22 0453) SpO2:  [97 %-99 %] 97 % (10/22 0453) Weight:  [68.1 kg] 68.1 kg (10/21 1708)  Weight change:  Filed Weights   10/18/17 0947 10/21/17 1708  Weight: 79.8 kg 68.1 kg    Intake/Output: I/O last 3 completed shifts: In: 971.7 [P.O.:480; IV Piggyback:491.7] Out: -    Intake/Output this shift:  Total I/O In: 240 [P.O.:240] Out: -   Physical Exam: General: NAD, laying in bed  Head: Normocephalic, atraumatic. Moist oral mucosal membranes  Eyes: Anicteric  Neck: Supple, trachea midline  Lungs:  Clear to auscultation  Heart: Regular rate and rhythm  Abdomen:  +ascites , umbilical hernia  Extremities: no peripheral edema.  Neurologic: Nonfocal, moving all four extremities  Skin: No lesions  GU: Scrotal edema    Basic Metabolic Panel: Recent Labs  Lab 10/18/17 1003 10/19/17 0359  10/20/17 0300  10/21/17 0954 10/21/17 1615 10/21/17 2148 10/22/17 0419 10/22/17 0424 10/22/17 0959  NA 121* 122*   < > 125*   < > 128* 126* 129*  --  130* 129*  K 5.6* 5.3*  --  4.0  --   --   --   --   --  2.9*  --   CL 86* 88*  --  89*  --   --   --   --   --   --   --   CO2 24 24  --  27  --   --   --   --   --   --   --   GLUCOSE 295* 303*  --  296*  --   --   --   --   --   --   --   BUN 19 22  --  21  --   --   --   --  15  --   --   CREATININE 1.43* 1.63*  --  1.26*  --   --   --   --  1.08  --   --   CALCIUM 7.7* 7.5*  --  7.7*  --   --   --   --   --   --   --   MG  --   --   --   --   --   --   --   --   --  1.1*  --   PHOS  --   --   --   --   --   --   --   --   --  2.8  --    < > = values in this interval not  displayed.    Liver Function Tests: Recent Labs  Lab 10/18/17 1003 10/20/17 0300  AST 31 26  ALT 14 13  ALKPHOS 289* 232*  BILITOT 3.7* 2.4*  PROT 6.7 5.6*  ALBUMIN 2.0* 2.4*   Recent Labs  Lab 10/18/17 1003  LIPASE 31   Recent Labs  Lab 10/18/17 1003  AMMONIA 17    CBC: Recent Labs  Lab 10/18/17 1003 10/19/17 0359  WBC 6.1 6.7  HGB 11.5* 10.4*  HCT 33.9* 31.4*  MCV 84.8 85.6  PLT 145* 137*    Cardiac Enzymes: No results for input(s): CKTOTAL, CKMB, CKMBINDEX, TROPONINI in the last 168 hours.  BNP: Invalid input(s): POCBNP  CBG: Recent Labs  Lab 10/21/17 0744 10/21/17 1139 10/21/17 1656 10/21/17 2028 10/22/17 0726  GLUCAP 369* 277* 316* 221* 138*    Microbiology: Results for orders placed or performed during the hospital encounter of 09/13/17  MRSA PCR Screening     Status: None   Collection Time: 09/15/17  8:17 AM  Result Value Ref Range Status   MRSA by PCR NEGATIVE NEGATIVE Final    Comment:        The GeneXpert MRSA Assay (FDA approved for NASAL specimens only), is one component of a comprehensive MRSA colonization surveillance program. It is not intended to diagnose MRSA infection nor to guide or monitor treatment for MRSA infections. Performed at [facility], [address] [place], [place] [address]     Coagulation Studies: No results for input(s): LABPROT, INR in the last 72 hours.  Urinalysis: No results for input(s): COLORURINE, LABSPEC, PHURINE, GLUCOSEU, HGBUR, BILIRUBINUR, KETONESUR, PROTEINUR, UROBILINOGEN, NITRITE, LEUKOCYTESUR in the last 72 hours.  Invalid input(s): APPERANCEUR    Imaging: US Paracentesis  Result Date: 10/21/2017 INDICATION: Recurrent symptomatic intra-abdominal ascites. Please perform ultrasound-guided paracentesis for therapeutic purposes as indicated. Paracentesis is limited at 5 L. EXAM: ULTRASOUND-GUIDED PARACENTESIS COMPARISON:  CT abdomen pelvis-09/20/2017; multiple previous  ultrasound-guided paracenteses, most recently 09/16/2017 yielding 2 L of ascitic fluid. MEDICATIONS: None. COMPLICATIONS: None immediate. TECHNIQUE: Informed written consent was obtained from the patient after a discussion of the risks, benefits and alternatives to treatment. A timeout was performed prior to the initiation of the procedure. Initial ultrasound scanning demonstrates a large amount of ascites within the right lower abdominal quadrant. The right lower abdomen was prepped and draped in the usual sterile fashion. 1% lidocaine with epinephrine was used for local anesthesia. An ultrasound image was saved for documentation purposed. An 8 Fr Safe-T-Centesis catheter was introduced. The paracentesis was performed. The catheter was removed and a dressing was applied. The patient tolerated the procedure well without immediate post procedural complication. FINDINGS: A total of approximately 5 liters of serous fluid was removed. IMPRESSION: Successful ultrasound-guided paracentesis yielding 5 liters of peritoneal fluid. Electronically Signed   By: Sandi Mariscal M.D.   On: 10/21/2017 10:50     Medications:   . magnesium sulfate 1 - 4 g bolus IVPB     . aspirin EC  81 mg Oral Daily  . enoxaparin (LOVENOX) injection  40 mg Subcutaneous Q24H  . feeding supplement (ENSURE ENLIVE)  237 mL Oral TID BM  . folic acid  1 mg Oral Daily  . gabapentin  200 mg Oral BID  . insulin aspart  0-5 Units Subcutaneous QHS  . insulin aspart  0-9 Units Subcutaneous TID WC  . insulin detemir  30 Units Subcutaneous Daily  . levothyroxine  25 mcg Oral QAC breakfast  . multivitamin with minerals  1 tablet Oral Daily  . potassium chloride  40 mEq Oral Q6H  . tamsulosin  0.4 mg Oral QODAY  . thiamine  100 mg Oral Daily   ALPRAZolam, morphine injection, ondansetron **OR** ondansetron (ZOFRAN) IV, oxyCODONE, prochlorperazine  Assessment/ Plan:  Mr. Rodney Stewart is a 62 y.o. white male with BPH, chronic kidney disease  stage III baseline creatinine 1.5, metastatic renal cell carcinoma status post left nephrectomy 05/2012, diabetes mellitus type  2, hypertension, EtoH, admitted on 10/18/2017 for Hepatorenal syndrome ([place]) [K76.7] Hyperkalemia [E87.5] Hyponatremia [E87.1] Generalized abdominal pain [R10.84]  1.  Hyponatremia: with hypervolemia -serum sodium is improved 130.  Currently off of diuretics.  2.  Chronic kidney disease stage III with proteinuria: baseline Creatinine of 1.5 Chronic kidney disease secondary to solitary kidney, diabetes and liver failure, alcohol abuse.  reatinine accident 1.1.  However suspect that there has been some loss of muscle mass over time.  3.  Anemia of chronic kidney disease with thrombocytopenia: secondary to liver failure.  Continue to periodically monitor serum potassium.  4. Hyperkalemia: secondary to spironolactone, chronic kidney disease and hepatorenal syndrome -potassium low at the moment.  Agree with potassium chloride 40 mg by mouth every 6 hours.  5. Ascites: Appears to be doing better with ascites removal.    LOS: 4 Michele Kerlin 10/22/201911:55 AM

## 2017-10-22 NOTE — Care Management Note (Signed)
Case Management Note  Patient Details  Name: Rodney Stewart MRN: [id number] Date of Birth: 1955/06/03  Subjective/Objective:   Admitted to [facility] with the diagnosis of cirrhosis. Lives alone. Daughter is Rodney Stewart [phone]). Sees Rodney Stewart as primary care physician. Prescriptions are filled at [facility] on [address]. Rodney Stewart has sent Nurse to the home twice a year, but has had no other home health services in the past. No skilled nursing. No home oxygen. Rolling walker, cane and raised toilet seat in the home. Takes care of all basic activities of daily living himself, drives. No falls. Decreased appetite x 1 year. Lost 35 pounds. Family will transport                 Action/Plan: Discussed Home Health agencies at the bedside. Chose [facility]. Will update Rodney Stewart, representative for [employer].   Expected Discharge Date:  10/22/17               Expected Discharge Plan:     In-House Referral:   yes  Discharge planning Services   yes  Post Acute Care Choice:    Choice offered to:   patient  DME Arranged:    DME Agency:     HH Arranged:   yes [place] Agency:   [facility]  Status of Service:     If discussed at [place] of Stay Meetings, dates discussed:    Additional Comments:  Rodney Ammons, RN MSN CCM Care Management [phone] 10/22/2017, 2:51 PM

## 2017-10-22 NOTE — Evaluation (Signed)
Physical Therapy Evaluation Patient Details Name: Rodney Stewart MRN: [id number] DOB: [birth year] Today's Date: 10/22/2017   History of Present Illness  Pt is a 62 y.o. male who presents with chief complaint of nausea.  Patient presented for persistent malaise, weakness, chronic pain.  He has cirrhosis and significant edema.  He has persistent hyponatremia.  Over the last several days he has had increased edema including scrotal swelling.  Hospitalist were called for admission with assessment including:  hyponatremia, hypokalemia, cirrhosis, hypoalbuminemia, DM II, CAD, and CKD.      Clinical Impression  Pt presents with mild deficits in strength, gait, and activity tolerance and endorses feeling weaker than at his baseline.  Pt is active at baseline including mowing and trimming his yard and walking community distances without and AD.  Pt did not feel comfortable ambulating without an AD during this assessment and instead used a RW which he has used after previous admissions while building back his strength.  Pt ambulated with a slow cadence but was steady without LOB and demonstrated fair to good control ascending and descending stairs.  Pt will benefit from HHPT services upon discharge to safely address above deficits for decreased risk of further functional decline and eventual return to PLOF.      Follow Up Recommendations Home health PT    Equipment Recommendations  None recommended by PT    Recommendations for Other Services       Precautions / Restrictions Precautions Precautions: Fall Restrictions Weight Bearing Restrictions: No      Mobility  Bed Mobility Overal bed mobility: Independent                Transfers Overall transfer level: Independent                  Ambulation/Gait Ambulation/Gait assistance: Supervision Gait Distance (Feet): 150 Feet Assistive device: Rolling walker (2 wheeled) Gait Pattern/deviations: Step-through pattern;Decreased step  length - right;Decreased step length - left;Trunk flexed Gait velocity: Decreased   General Gait Details: Slow cadence but steady without LOB; pt requested use of RW due to weakness compared to baseline  Stairs Stairs: Yes Stairs assistance: Min guard Stair Management: Two rails Number of Stairs: 6 General stair comments: Pt steady during stair assessment with good concentric control and fair eccentric control   Wheelchair Mobility    Modified Rankin (Stroke Patients Only)       Balance Overall balance assessment: No apparent balance deficits (not formally assessed)                                           Pertinent Vitals/Pain Pain Assessment: No/denies pain    Home Living Family/patient expects to be discharged to:: Private residence Living Arrangements: Alone Available Help at Discharge: Family;Available PRN/intermittently Type of Home: House Home Access: Stairs to enter Entrance Stairs-Rails: None Entrance Stairs-Number of Steps: 2 Home Layout: Two level;Bed/bath upstairs Home Equipment: Walker - 2 wheels      Prior Function Level of Independence: Independent         Comments: Pt active prior to admission, Ind amb without an AD, mows and trims yard, no fall history, Ind with ADLs     Hand Dominance        Extremity/Trunk Assessment   Upper Extremity Assessment Upper Extremity Assessment: Overall WFL for tasks assessed    Lower Extremity Assessment Lower Extremity Assessment: Generalized  weakness       Communication   Communication: No difficulties  Cognition Arousal/Alertness: Awake/alert Behavior During Therapy: WFL for tasks assessed/performed Overall Cognitive Status: Within Functional Limits for tasks assessed                                        General Comments      Exercises     Assessment/Plan    PT Assessment Patient needs continued PT services  PT Problem List Decreased  strength;Decreased activity tolerance       PT Treatment Interventions DME instruction;Gait training;Stair training;Balance training;Therapeutic exercise;Therapeutic activities;Patient/family education;Functional mobility training    PT Goals (Current goals can be found in the Care Plan section)  Acute Rehab PT Goals Patient Stated Goal: To get stronger and return home PT Goal Formulation: With patient Time For Goal Achievement: 11/04/17 Potential to Achieve Goals: Good    Frequency Min 2X/week   Barriers to discharge        Co-evaluation               AM-PAC PT "6 Clicks" Daily Activity  Outcome Measure Difficulty turning over in bed (including adjusting bedclothes, sheets and blankets)?: None Difficulty moving from lying on back to sitting on the side of the bed? : None Difficulty sitting down on and standing up from a chair with arms (e.g., wheelchair, bedside commode, etc,.)?: None Help needed moving to and from a bed to chair (including a wheelchair)?: None Help needed walking in hospital room?: None Help needed climbing 3-5 steps with a railing? : A Little 6 Click Score: 23    End of Session Equipment Utilized During Treatment: Gait belt Activity Tolerance: Patient tolerated treatment well Patient left: in bed;with family/visitor present;with call bell/phone within reach;with chair alarm set Nurse Communication: Mobility status PT Visit Diagnosis: Muscle weakness (generalized) (M62.81);Difficulty in walking, not elsewhere classified (R26.2)    Time: 1610-9604 PT Time Calculation (min) (ACUTE ONLY): 18 min   Charges:   PT Evaluation $PT Eval Low Complexity: 1 Low          D. Royetta Asal PT, DPT 10/22/17, 3:34 PM

## 2017-10-22 NOTE — Progress Notes (Signed)
Pharmacy Electrolyte Monitoring Consult:  Pharmacy consulted to assist in monitoring and replacing electrolytes in this 62 y.o. male admitted on 10/18/2017 with Nausea  Lasix 60 mg IV q8h x9 doses (10/19-10/22)  Labs:  Sodium (mmol/L)  Date Value  10/22/2017 130 (L)  09/24/2017 121 (L)  04/28/2012 135 (L)   Potassium (mmol/L)  Date Value  10/22/2017 2.9 (L)  04/28/2012 5.0   Magnesium (mg/dL)  Date Value  10/22/2017 1.1 (L)   Phosphorus (mg/dL)  Date Value  10/22/2017 2.8   Calcium (mg/dL)  Date Value  10/20/2017 7.7 (L)   Calcium, Total (PTH) (mg/dL)  Date Value  02/19/2017 8.2 (L)   Albumin (g/dL)  Date Value  10/20/2017 2.4 (L)  09/23/2017 2.8 (L)  04/28/2012 3.1 (L)   Pt with hyponatremia with hypervolemia and hyperkalemia on admission. SCr improved since admission.   SCr 1.08, CrCl 69.2 ml/min  Assessment/Plan: K 2.9, Mag 1.1 - will order KCl 40 mEq PO x2 doses (due to the above will supp with PO for now to avoid excess fluids) and Mag 4 g IV x1  Per dietician check all labs tomorrow  Pharmacy will continue to follow  Rocky Morel 10/22/2017 9:31 AM

## 2017-10-23 ENCOUNTER — Inpatient Hospital Stay: Payer: Medicare HMO

## 2017-10-23 ENCOUNTER — Other Ambulatory Visit: Payer: Self-pay | Admitting: Internal Medicine

## 2017-10-23 VITALS — BP 123/85 | HR 102 | Temp 98.4°F | Resp 18 | Wt 148.5 lb

## 2017-10-23 DIAGNOSIS — C7801 Secondary malignant neoplasm of right lung: Secondary | ICD-10-CM | POA: Diagnosis not present

## 2017-10-23 DIAGNOSIS — Z5112 Encounter for antineoplastic immunotherapy: Secondary | ICD-10-CM | POA: Diagnosis present

## 2017-10-23 DIAGNOSIS — J90 Pleural effusion, not elsewhere classified: Secondary | ICD-10-CM | POA: Diagnosis not present

## 2017-10-23 DIAGNOSIS — N183 Chronic kidney disease, stage 3 (moderate): Secondary | ICD-10-CM | POA: Diagnosis not present

## 2017-10-23 DIAGNOSIS — D61818 Other pancytopenia: Secondary | ICD-10-CM | POA: Diagnosis not present

## 2017-10-23 DIAGNOSIS — E1122 Type 2 diabetes mellitus with diabetic chronic kidney disease: Secondary | ICD-10-CM | POA: Diagnosis not present

## 2017-10-23 DIAGNOSIS — C649 Malignant neoplasm of unspecified kidney, except renal pelvis: Secondary | ICD-10-CM

## 2017-10-23 DIAGNOSIS — E871 Hypo-osmolality and hyponatremia: Secondary | ICD-10-CM | POA: Diagnosis not present

## 2017-10-23 DIAGNOSIS — K7460 Unspecified cirrhosis of liver: Secondary | ICD-10-CM | POA: Diagnosis not present

## 2017-10-23 DIAGNOSIS — Z794 Long term (current) use of insulin: Secondary | ICD-10-CM | POA: Diagnosis not present

## 2017-10-23 DIAGNOSIS — E875 Hyperkalemia: Secondary | ICD-10-CM | POA: Diagnosis not present

## 2017-10-23 DIAGNOSIS — Z87891 Personal history of nicotine dependence: Secondary | ICD-10-CM | POA: Diagnosis not present

## 2017-10-23 DIAGNOSIS — Z7982 Long term (current) use of aspirin: Secondary | ICD-10-CM | POA: Diagnosis not present

## 2017-10-23 DIAGNOSIS — C7802 Secondary malignant neoplasm of left lung: Principal | ICD-10-CM

## 2017-10-23 DIAGNOSIS — M109 Gout, unspecified: Secondary | ICD-10-CM | POA: Diagnosis not present

## 2017-10-23 DIAGNOSIS — Z79899 Other long term (current) drug therapy: Secondary | ICD-10-CM | POA: Diagnosis not present

## 2017-10-23 DIAGNOSIS — C7800 Secondary malignant neoplasm of unspecified lung: Secondary | ICD-10-CM

## 2017-10-23 DIAGNOSIS — I129 Hypertensive chronic kidney disease with stage 1 through stage 4 chronic kidney disease, or unspecified chronic kidney disease: Secondary | ICD-10-CM | POA: Diagnosis not present

## 2017-10-23 DIAGNOSIS — R188 Other ascites: Secondary | ICD-10-CM | POA: Diagnosis not present

## 2017-10-23 DIAGNOSIS — Z905 Acquired absence of kidney: Secondary | ICD-10-CM | POA: Diagnosis not present

## 2017-10-23 DIAGNOSIS — K219 Gastro-esophageal reflux disease without esophagitis: Secondary | ICD-10-CM | POA: Diagnosis not present

## 2017-10-23 DIAGNOSIS — C642 Malignant neoplasm of left kidney, except renal pelvis: Secondary | ICD-10-CM | POA: Diagnosis not present

## 2017-10-23 LAB — CBC WITH DIFFERENTIAL/PLATELET
Abs Immature Granulocytes: 0.01 10*3/uL (ref 0.00–0.07)
BASOS ABS: 0 10*3/uL (ref 0.0–0.1)
Basophils Relative: 1 %
EOS ABS: 0 10*3/uL (ref 0.0–0.5)
EOS PCT: 1 %
HEMATOCRIT: 28.1 % — AB (ref 39.0–52.0)
Hemoglobin: 9.2 g/dL — ABNORMAL LOW (ref 13.0–17.0)
Immature Granulocytes: 0 %
Lymphocytes Relative: 10 %
Lymphs Abs: 0.4 10*3/uL — ABNORMAL LOW (ref 0.7–4.0)
MCH: 28.1 pg (ref 26.0–34.0)
MCHC: 32.7 g/dL (ref 30.0–36.0)
MCV: 85.9 fL (ref 80.0–100.0)
Monocytes Absolute: 0.4 10*3/uL (ref 0.1–1.0)
Monocytes Relative: 9 %
NRBC: 0 % (ref 0.0–0.2)
Neutro Abs: 3.1 10*3/uL (ref 1.7–7.7)
Neutrophils Relative %: 79 %
Platelets: 95 10*3/uL — ABNORMAL LOW (ref 150–400)
RBC: 3.27 MIL/uL — ABNORMAL LOW (ref 4.22–5.81)
RDW: 18 % — AB (ref 11.5–15.5)
WBC: 3.9 10*3/uL — AB (ref 4.0–10.5)

## 2017-10-23 LAB — COMPREHENSIVE METABOLIC PANEL
ALK PHOS: 264 U/L — AB (ref 38–126)
ALT: 25 U/L (ref 0–44)
AST: 64 U/L — ABNORMAL HIGH (ref 15–41)
Albumin: 3.3 g/dL — ABNORMAL LOW (ref 3.5–5.0)
Anion gap: 9 (ref 5–15)
BUN: 16 mg/dL (ref 8–23)
CALCIUM: 8.1 mg/dL — AB (ref 8.9–10.3)
CO2: 30 mmol/L (ref 22–32)
CREATININE: 1.06 mg/dL (ref 0.61–1.24)
Chloride: 89 mmol/L — ABNORMAL LOW (ref 98–111)
Glucose, Bld: 234 mg/dL — ABNORMAL HIGH (ref 70–99)
Potassium: 4.5 mmol/L (ref 3.5–5.1)
Sodium: 128 mmol/L — ABNORMAL LOW (ref 135–145)
Total Bilirubin: 3.7 mg/dL — ABNORMAL HIGH (ref 0.3–1.2)
Total Protein: 6.8 g/dL (ref 6.5–8.1)

## 2017-10-23 MED ORDER — SODIUM CHLORIDE 0.9 % IV SOLN
Freq: Once | INTRAVENOUS | Status: AC
  Administered 2017-10-23: 15:00:00 via INTRAVENOUS
  Filled 2017-10-23: qty 250

## 2017-10-23 MED ORDER — SODIUM CHLORIDE 0.9 % IV SOLN
240.0000 mg | Freq: Once | INTRAVENOUS | Status: AC
  Administered 2017-10-23: 240 mg via INTRAVENOUS
  Filled 2017-10-23: qty 24

## 2017-10-23 NOTE — Progress Notes (Signed)
Sodium 128, Bilirubin 3.7, HR 102. Pt reports increased fatigue. Per Judeth Horn CMA per Dr. Rogue Bussing okay to proceed with treatment. Pt to seek evaluation by symptom management clinic if symptoms worsen prior to next appt with Dr. Grayland Ormond per Dr. Rogue Bussing. Pt aware and verbalizes understanding.

## 2017-10-23 NOTE — Progress Notes (Signed)
Okay to proceed with treatment. reviewed the labs- ok to proceed with treatment.  If feeling worse- recommend follow up with [facility] sooner- discussed with Brooke.

## 2017-10-24 LAB — THYROID PANEL WITH TSH
Free Thyroxine Index: 1.4 (ref 1.2–4.9)
T3 UPTAKE RATIO: 30 % (ref 24–39)
T4, Total: 4.7 ug/dL (ref 4.5–12.0)
TSH: 11.45 u[IU]/mL — ABNORMAL HIGH (ref 0.450–4.500)

## 2017-10-25 ENCOUNTER — Ambulatory Visit (INDEPENDENT_AMBULATORY_CARE_PROVIDER_SITE_OTHER): Payer: Medicare HMO | Admitting: Internal Medicine

## 2017-10-25 ENCOUNTER — Telehealth: Payer: Self-pay

## 2017-10-25 ENCOUNTER — Encounter: Payer: Self-pay | Admitting: Internal Medicine

## 2017-10-25 VITALS — BP 130/56 | HR 105 | Temp 99.2°F | Ht 71.0 in | Wt 152.6 lb

## 2017-10-25 DIAGNOSIS — E871 Hypo-osmolality and hyponatremia: Secondary | ICD-10-CM

## 2017-10-25 DIAGNOSIS — N183 Chronic kidney disease, stage 3 unspecified: Secondary | ICD-10-CM

## 2017-10-25 DIAGNOSIS — N433 Hydrocele, unspecified: Secondary | ICD-10-CM

## 2017-10-25 DIAGNOSIS — C7802 Secondary malignant neoplasm of left lung: Secondary | ICD-10-CM

## 2017-10-25 DIAGNOSIS — Z794 Long term (current) use of insulin: Secondary | ICD-10-CM

## 2017-10-25 DIAGNOSIS — K7460 Unspecified cirrhosis of liver: Secondary | ICD-10-CM | POA: Diagnosis not present

## 2017-10-25 DIAGNOSIS — K429 Umbilical hernia without obstruction or gangrene: Secondary | ICD-10-CM | POA: Diagnosis not present

## 2017-10-25 DIAGNOSIS — K7011 Alcoholic hepatitis with ascites: Secondary | ICD-10-CM

## 2017-10-25 DIAGNOSIS — E039 Hypothyroidism, unspecified: Secondary | ICD-10-CM | POA: Diagnosis not present

## 2017-10-25 DIAGNOSIS — E113399 Type 2 diabetes mellitus with moderate nonproliferative diabetic retinopathy without macular edema, unspecified eye: Secondary | ICD-10-CM | POA: Diagnosis not present

## 2017-10-25 DIAGNOSIS — R69 Illness, unspecified: Secondary | ICD-10-CM | POA: Diagnosis not present

## 2017-10-25 DIAGNOSIS — R188 Other ascites: Secondary | ICD-10-CM

## 2017-10-25 DIAGNOSIS — C649 Malignant neoplasm of unspecified kidney, except renal pelvis: Secondary | ICD-10-CM

## 2017-10-25 MED ORDER — LEVOTHYROXINE SODIUM 50 MCG PO TABS: 50.0000 ug | ORAL_TABLET | Freq: Every day | ORAL | 0 refills | Status: DC

## 2017-10-25 NOTE — Patient Instructions (Addendum)
Increase synthyroid to 50 mcg take 2 of 25 mcg at the same time for now until you run out    Cirrhosis Cirrhosis is long-term (chronic) liver injury. The liver is your largest internal organ, and it performs many functions. The liver converts food into energy, removes toxic material from your blood, makes important proteins, and absorbs necessary vitamins from your diet. If you have cirrhosis, it means many of your healthy liver cells have been replaced by scar tissue. This prevents blood from flowing through your liver, which makes it difficult for your liver to function. This scarring is not reversible, but treatment can prevent it from getting worse. What are the causes? Hepatitis C and long-term alcohol abuse are the most common causes of cirrhosis. Other causes include:  Nonalcoholic fatty liver disease.  Hepatitis B infection.  Autoimmune hepatitis.  Diseases that cause blockage of ducts inside the liver.  Inherited liver diseases.  Reactions to certain long-term medicines.  Parasitic infections.  Long-term exposure to certain toxins.  What increases the risk? You may have a higher risk of cirrhosis if you:  Have certain hepatitis viruses.  Abuse alcohol, especially if you are male.  Are overweight.  Share needles.  Have unprotected sex with someone who has hepatitis.  What are the signs or symptoms? You may not have any signs and symptoms at first. Symptoms may not develop until the damage to your liver starts to get worse. Signs and symptoms of cirrhosis may include:  Tenderness in the right-upper part of your abdomen.  Weakness and tiredness (fatigue).  Loss of appetite.  Nausea.  Weight loss and muscle loss.  Itchiness.  Yellow skin and eyes (jaundice).  Buildup of fluid in the abdomen (ascites).  Swelling of the feet and ankles (edema).  Appearance of tiny blood vessels under the skin.  Mental confusion.  Easy bruising and bleeding.  How  is this diagnosed? Your health care provider may suspect cirrhosis based on your symptoms and medical history, especially if you have other medical conditions or a history of alcohol abuse. Your health care provider will do a physical exam to feel your liver and check for signs of cirrhosis. Your health care provider may perform other tests, including:  Blood tests to check: ? Whether you have hepatitis B or C. ? Kidney function. ? Liver function.  Imaging tests such as: ? MRI or CT scan to look for changes seen in advanced cirrhosis. ? Ultrasound to see if normal liver tissue is being replaced by scar tissue.  A procedure using a long needle to take a sample of liver tissue (biopsy) for examination under a microscope. Liver biopsy can confirm the diagnosis of cirrhosis.  How is this treated? Treatment depends on how damaged your liver is and what caused the damage. Treatment may include treating cirrhosis symptoms or treating the underlying causes of the condition to try to slow the progression of the damage. Treatment may include:  Making lifestyle changes, such as: ? Eating a healthy diet. ? Restricting salt intake. ? Maintaining a healthy weight. ? Not abusing drugs or alcohol.  Taking medicines to: ? Treat liver infections or other infections. ? Control itching. ? Reduce fluid buildup. ? Reduce certain blood toxins. ? Reduce risk of bleeding from enlarged blood vessels in the stomach or esophagus (varices).  If varices are causing bleeding problems, you may need treatment with a procedure that ties up the vessels causing them to fall off (band ligation).  If cirrhosis is causing  your liver to fail, your health care provider may recommend a liver transplant.  Other treatments may be recommended depending on any complications of cirrhosis, such as liver-related kidney failure (hepatorenal syndrome).  Follow these instructions at home:  Take medicines only as directed by your  health care provider. Do not use drugs that are toxic to your liver. Ask your health care provider before taking any new medicines, including over-the-counter medicines.  Rest as needed.  Eat a well-balanced diet. Ask your health care provider or dietitian for more information.  You may have to follow a low-salt diet or restrict your water intake as directed.  Do not drink alcohol. This is especially important if you are taking acetaminophen.  Keep all follow-up visits as directed by your health care provider. This is important. Contact a health care provider if:  You have fatigue or weakness that is getting worse.  You develop swelling of the hands, feet, legs, or face.  You have a fever.  You develop loss of appetite.  You have nausea or vomiting.  You develop jaundice.  You develop easy bruising or bleeding. Get help right away if:  You vomit bright red blood or a material that looks like coffee grounds.  You have blood in your stools.  Your stools appear black and tarry.  You become confused.  You have chest pain or trouble breathing. This information is not intended to replace advice given to you by your health care provider. Make sure you discuss any questions you have with your health care provider. Document Released: 12/18/2004 Document Revised: 04/28/2015 Document Reviewed: 08/26/2013 Elsevier Interactive Patient Education  [employer].

## 2017-10-25 NOTE — Progress Notes (Signed)
Pre visit review using our clinic review tool, if applicable. No additional management support is needed unless otherwise documented below in the visit note. 

## 2017-10-25 NOTE — Progress Notes (Signed)
Chief Complaint  Patient presents with  . Follow-up   F/u  1. Recent hospitalization for liver cirrhosis complications with ascites 5 L fluid removed 10/21/17 with improved ascites he is being considered for TIPS but radiology wants to wait until Na is 135 and wants to coordinate this with GI and renal. Disc with pt and pts brother Rodney Stewart today who is cardiologist in [place]. Pt wants to keep his brother informed about his health and wants to be a primary emergency contact phone # [phone] [place]. Reviewed with brother pt MELD score is 25 and radiology wants Na 125 before TIPS pt has appt next week with renal will CC note to them His weight is down 21 lbs after paracentesis. Brother is c/w pts overall health and feels like he is declining  2. Hypothyroidism TSH trending down on levo 25 mcg will increase dose today   3. Hydrocele likely 2/2 #1 still has not heard [facility] urology 2nd opinion and has scrotal swelling will contact them Monday appt appt per [facility]   4. DM2 A1C 8.9 09/23/17    Review of Systems  Constitutional: Positive for weight loss.  HENT: Negative for hearing loss.   Cardiovascular: Negative for chest pain.  Gastrointestinal: Negative for abdominal pain.  Genitourinary:       +scrotum swelling    Skin: Negative for rash.  Psychiatric/Behavioral: Negative for depression.   Past Medical History:  Diagnosis Date  . Anemia   . Ascites   . BPH (benign prostatic hyperplasia)   . Cirrhosis ([place])   . CKD (chronic kidney disease) stage 3, GFR 30-59 ml/min (HCC)   . Clear cell renal cell carcinoma ([place]) 2014   Left Nephrectomy.  . Colon polyps   . Diabetes mellitus without complication ([place])    type 2   . Dyspnea    with exertion  . GERD (gastroesophageal reflux disease)   . History of gout   . History of nephrectomy    Left  . Hypertension   . Neuropathy   . Renal Cancer    Renal Cancer  . Renal cell carcinoma of left kidney (HCC)    mets to  lungs  . Umbilical hernia    Past Surgical History:  Procedure Laterality Date  . BACK SURGERY     ruptured disc 1991   . ENDOBRONCHIAL ULTRASOUND N/A 10/14/2015   Procedure: ENDOBRONCHIAL ULTRASOUND;  Surgeon: [name], MD;  Location: [facility] ORS;  Service: Pulmonary;  Laterality: N/A;  . ESOPHAGOGASTRODUODENOSCOPY (EGD) WITH PROPOFOL N/A 04/15/2017   Procedure: ESOPHAGOGASTRODUODENOSCOPY (EGD) WITH PROPOFOL;  Surgeon: Jonathon Bellows, MD;  Location: [facility] ENDOSCOPY;  Service: Gastroenterology;  Laterality: N/A;  Screen for esophageal varices  . EYE SURGERY Bilateral    Cataract Extraction with IOL  . IR RADIOLOGIST EVAL & MGMT  10/15/2017  . KNEE SURGERY Right   . NEPHRECTOMY     left kidney 2014 renal cell cancer    Family History  Problem Relation Age of Onset  . Ovarian cancer Mother   . Diabetes Father   . Hypertension Father    Social History   Socioeconomic History  . Marital status: Divorced    Spouse name: Not on file  . Number of children: 2  . Years of education: Not on file  . Highest education level: Not on file  Occupational History  . Not on file  Social Needs  . Financial resource strain: Not hard at all  . Food insecurity:  Worry: Never true    Inability: Never true  . Transportation needs:    Medical: No    Non-medical: No  Tobacco Use  . Smoking status: Former Smoker    Packs/day: 1.00    Types: Cigarettes    Last attempt to quit: 04/15/1993    Years since quitting: 24.5  . Smokeless tobacco: Never Used  Substance and Sexual Activity  . Alcohol use: Not Currently  . Drug use: No  . Sexual activity: Yes  Lifestyle  . Physical activity:    Days per week: Patient refused    Minutes per session: Patient refused  . Stress: Patient refused  Relationships  . Social connections:    Talks on phone: Patient refused    Gets together: Patient refused    Attends religious service: Patient refused    Active member of club or organization:  Patient refused    Attends meetings of clubs or organizations: Patient refused    Relationship status: Patient refused  . Intimate partner violence:    Fear of current or ex partner: Patient refused    Emotionally abused: Patient refused    Physically abused: Patient refused    Forced sexual activity: Patient refused  Other Topics Concern  . Not on file  Social History Narrative   HS graduate    Retired    Divorced    2 daughters    Lives alone    [name] to travel to [employer]    1 living brother who is doctor in [place], wears seat belt    Current Meds  Medication Sig  . allopurinol (ZYLOPRIM) 100 MG tablet Take 1 tablet (100 mg total) by mouth daily.  Marland Kitchen ALPRAZolam (XANAX) 1 MG tablet Take 1 mg by mouth 2 (two) times daily as needed for anxiety or sleep.   Marland Kitchen aspirin EC 81 MG tablet Take 81 mg by mouth daily.  . feeding supplement, ENSURE ENLIVE, (ENSURE ENLIVE) LIQD Take 237 mLs by mouth 3 (three) times daily between meals.  . furosemide (LASIX) 20 MG tablet Take 2 tablets (40 mg total) by mouth daily. In am  . gabapentin (NEURONTIN) 100 MG capsule Take 2 capsules (200 mg total) by mouth 3 (three) times daily. (Patient taking differently: Take 200 mg by mouth 2 (two) times daily. )  . insulin aspart (NOVOLOG) 100 UNIT/ML FlexPen Before meals 131-180 2 units, 181-240 4 units, 241-300 6 units, 301-350 8 units, 351-400 10 units, >400 12 units  . Insulin Detemir (LEVEMIR FLEXTOUCH) 100 UNIT/ML Pen Inject 30 Units into the skin daily at 10 pm.  . Insulin Pen Needle 32G X 4 MM MISC 1 Device by Does not apply route 2 (two) times daily. E11.9  . lactulose (CHRONULAC) 10 GM/15ML solution Take 45 mLs (30 g total) by mouth daily.  Marland Kitchen levothyroxine (SYNTHROID, LEVOTHROID) 50 MCG tablet Take 1 tablet (50 mcg total) by mouth daily before breakfast. 30 minutes  . lovastatin (MEVACOR) 20 MG tablet Take 1 tablet (20 mg total) by mouth daily at 6 PM.  . magic mouthwash w/lidocaine SOLN  Take 5 mLs by mouth 3 (three) times daily as needed for mouth pain. Swish and spit  . ONE TOUCH ULTRA TEST test strip Bid  . oxyCODONE (ROXICODONE) 5 MG immediate release tablet Take 1 tablet (5 mg total) by mouth every 6 (six) hours as needed for severe pain or breakthrough pain.  . potassium chloride SA (K-DUR,KLOR-CON) 20 MEQ tablet Take 1 tablet (  20 mEq total) by mouth daily for 3 days.  . tamsulosin (FLOMAX) 0.4 MG CAPS capsule Take 1 capsule (0.4 mg total) by mouth every other day. With supper  . valACYclovir (VALTREX) 1000 MG tablet Take 1 tablet (1,000 mg total) by mouth 2 (two) times daily. X 5-10 days with food  . [DISCONTINUED] levothyroxine (SYNTHROID, LEVOTHROID) 25 MCG tablet Take 1 tablet (25 mcg total) by mouth daily before breakfast. 30 minutes   No Known Allergies Recent Results (from the past 2160 hour(s))  CBC with Differential/Platelet     Status: Abnormal   Collection Time: 08/07/17  9:07 AM  Result Value Ref Range   WBC 3.9 3.8 - 10.6 K/uL   RBC 4.33 (L) 4.40 - 5.90 MIL/uL   Hemoglobin 11.8 (L) 13.0 - 18.0 g/dL   HCT 35.4 (L) 40.0 - 52.0 %   MCV 81.9 80.0 - 100.0 fL   MCH 27.4 26.0 - 34.0 pg   MCHC 33.4 32.0 - 36.0 g/dL   RDW 15.8 (H) 11.5 - 14.5 %   Platelets 153 150 - 440 K/uL   Neutrophils Relative % 61 %   Neutro Abs 2.4 1.4 - 6.5 K/uL   Lymphocytes Relative 17 %   Lymphs Abs 0.7 (L) 1.0 - 3.6 K/uL   Monocytes Relative 14 %   Monocytes Absolute 0.5 0.2 - 1.0 K/uL   Eosinophils Relative 7 %   Eosinophils Absolute 0.3 0 - 0.7 K/uL   Basophils Relative 1 %   Basophils Absolute 0.0 0 - 0.1 K/uL    Comment: Performed at [facility], [address] [place], [place] [address]  Comprehensive metabolic panel     Status: Abnormal   Collection Time: 08/07/17  9:07 AM  Result Value Ref Range   Sodium 125 (L) 135 - 145 mmol/L   Potassium 4.5 3.5 - 5.1 mmol/L   Chloride 87 (L) 98 - 111 mmol/L   CO2 24 22 - 32 mmol/L   Glucose, Bld 147 (H) 70 - 99 mg/dL    BUN 27 (H) 8 - 23 mg/dL   Creatinine, Ser 1.63 (H) 0.61 - 1.24 mg/dL   Calcium 9.1 8.9 - 10.3 mg/dL   Total Protein 7.9 6.5 - 8.1 g/dL   Albumin 3.2 (L) 3.5 - 5.0 g/dL   AST 59 (H) 15 - 41 U/L   ALT 24 0 - 44 U/L   Alkaline Phosphatase 491 (H) 38 - 126 U/L   Total Bilirubin 2.1 (H) 0.3 - 1.2 mg/dL   GFR calc non Af Amer 44 (L) >60 mL/min   GFR calc Af Amer 51 (L) >60 mL/min    Comment: (NOTE) The eGFR has been calculated using the CKD EPI equation. This calculation has not been validated in all clinical situations. eGFR's persistently <60 mL/min signify possible Chronic Kidney Disease.    Anion gap 14 5 - 15    Comment: Performed at [facility], [address] [place], [place] [address]  Thyroid Panel With TSH     Status: Abnormal   Collection Time: 08/21/17  1:06 PM  Result Value Ref Range   TSH 8.370 (H) 0.450 - 4.500 uIU/mL   T4, Total 6.4 4.5 - 12.0 ug/dL   T3 Uptake Ratio 30 24 - 39 %   Free Thyroxine Index 1.9 1.2 - 4.9    Comment: (NOTE) Performed At: [facility] [place], [place] [id number] Rush Farmer MD [id number]   CBC with Differential/Platelet  Status: Abnormal   Collection Time: 08/21/17  1:06 PM  Result Value Ref Range   WBC 3.1 (L) 3.8 - 10.6 K/uL   RBC 4.16 (L) 4.40 - 5.90 MIL/uL   Hemoglobin 11.2 (L) 13.0 - 18.0 g/dL   HCT 33.6 (L) 40.0 - 52.0 %   MCV 80.9 80.0 - 100.0 fL   MCH 27.1 26.0 - 34.0 pg   MCHC 33.4 32.0 - 36.0 g/dL   RDW 15.5 (H) 11.5 - 14.5 %   Platelets 126 (L) 150 - 440 K/uL   Neutrophils Relative % 69 %   Neutro Abs 2.1 1.4 - 6.5 K/uL   Lymphocytes Relative 14 %   Lymphs Abs 0.4 (L) 1.0 - 3.6 K/uL   Monocytes Relative 11 %   Monocytes Absolute 0.4 0.2 - 1.0 K/uL   Eosinophils Relative 5 %   Eosinophils Absolute 0.1 0 - 0.7 K/uL   Basophils Relative 1 %   Basophils Absolute 0.0 0 - 0.1 K/uL    Comment: Performed at [facility], [address] [place], [place] [address]    Comprehensive metabolic panel     Status: Abnormal   Collection Time: 08/21/17  1:06 PM  Result Value Ref Range   Sodium 124 (L) 135 - 145 mmol/L   Potassium 5.5 (H) 3.5 - 5.1 mmol/L   Chloride 88 (L) 98 - 111 mmol/L   CO2 27 22 - 32 mmol/L   Glucose, Bld 256 (H) 70 - 99 mg/dL   BUN 23 8 - 23 mg/dL   Creatinine, Ser 1.38 (H) 0.61 - 1.24 mg/dL   Calcium 8.5 (L) 8.9 - 10.3 mg/dL   Total Protein 7.1 6.5 - 8.1 g/dL   Albumin 3.1 (L) 3.5 - 5.0 g/dL   AST 55 (H) 15 - 41 U/L   ALT 27 0 - 44 U/L   Alkaline Phosphatase 516 (H) 38 - 126 U/L   Total Bilirubin 2.0 (H) 0.3 - 1.2 mg/dL   GFR calc non Af Amer 54 (L) >60 mL/min   GFR calc Af Amer >60 >60 mL/min    Comment: (NOTE) The eGFR has been calculated using the CKD EPI equation. This calculation has not been validated in all clinical situations. eGFR's persistently <60 mL/min signify possible Chronic Kidney Disease.    Anion gap 9 5 - 15    Comment: Performed at [facility], [place], [place] [address]  CBC with Differential/Platelet     Status: Abnormal   Collection Time: 09/11/17 10:31 AM  Result Value Ref Range   WBC 3.9 3.8 - 10.6 K/uL   RBC 4.29 (L) 4.40 - 5.90 MIL/uL   Hemoglobin 11.9 (L) 13.0 - 18.0 g/dL   HCT 35.2 (L) 40.0 - 52.0 %   MCV 82.0 80.0 - 100.0 fL   MCH 27.7 26.0 - 34.0 pg   MCHC 33.7 32.0 - 36.0 g/dL   RDW 15.0 (H) 11.5 - 14.5 %   Platelets 159 150 - 440 K/uL   Neutrophils Relative % 61 %   Neutro Abs 2.4 1.4 - 6.5 K/uL   Lymphocytes Relative 19 %   Lymphs Abs 0.7 (L) 1.0 - 3.6 K/uL   Monocytes Relative 13 %   Monocytes Absolute 0.5 0.2 - 1.0 K/uL   Eosinophils Relative 6 %   Eosinophils Absolute 0.2 0 - 0.7 K/uL   Basophils Relative 1 %   Basophils Absolute 0.0 0 - 0.1 K/uL    Comment: Performed at [facility], [address] [place], [place] [address]  Comprehensive metabolic panel     Status: Abnormal   Collection Time: 09/11/17 10:31 AM  Result Value Ref Range    Sodium 119 (LL) 135 - 145 mmol/L    Comment: RESULTS VERIFIED BY REPEAT TESTING CRITICAL RESULT CALLED TO, READ BACK BY AND VERIFIED WITH [facility] [place] AT 10:58 09/11/2017 KMR    Potassium 5.2 (H) 3.5 - 5.1 mmol/L   Chloride 85 (L) 98 - 111 mmol/L   CO2 25 22 - 32 mmol/L   Glucose, Bld 69 (L) 70 - 99 mg/dL   BUN 19 8 - 23 mg/dL   Creatinine, Ser 1.20 0.61 - 1.24 mg/dL   Calcium 9.0 8.9 - 10.3 mg/dL   Total Protein 7.2 6.5 - 8.1 g/dL   Albumin 3.3 (L) 3.5 - 5.0 g/dL   AST 51 (H) 15 - 41 U/L   ALT 23 0 - 44 U/L   Alkaline Phosphatase 417 (H) 38 - 126 U/L   Total Bilirubin 1.9 (H) 0.3 - 1.2 mg/dL   GFR calc non Af Amer >60 >60 mL/min   GFR calc Af Amer >60 >60 mL/min    Comment: (NOTE) The eGFR has been calculated using the CKD EPI equation. This calculation has not been validated in all clinical situations. eGFR's persistently <60 mL/min signify possible Chronic Kidney Disease.    Anion gap 9 5 - 15    Comment: Performed at [facility], [address] [place], [place] [address]  Thyroid Panel With TSH     Status: Abnormal   Collection Time: 09/11/17 10:31 AM  Result Value Ref Range   TSH 9.560 (H) 0.450 - 4.500 uIU/mL   T4, Total 8.8 4.5 - 12.0 ug/dL   T3 Uptake Ratio 31 24 - 39 %   Free Thyroxine Index 2.7 1.2 - 4.9    Comment: (NOTE) Performed At: [facility] [place], [place] [id number] Rush Farmer MD [id number]   Basic metabolic panel     Status: Abnormal   Collection Time: 09/13/17  9:44 PM  Result Value Ref Range   Sodium 117 (LL) 135 - 145 mmol/L    Comment: CRITICAL RESULT CALLED TO, READ BACK BY AND VERIFIED WITH JOHN HOFFMASTER 09/13/17 2224 JML    Potassium 5.2 (H) 3.5 - 5.1 mmol/L   Chloride 85 (L) 98 - 111 mmol/L   CO2 25 22 - 32 mmol/L   Glucose, Bld 243 (H) 70 - 99 mg/dL   BUN 19 8 - 23 mg/dL   Creatinine, Ser 1.35 (H) 0.61 - 1.24 mg/dL   Calcium 8.2 (L) 8.9 - 10.3 mg/dL   GFR calc non Af Amer 55 (L) >60 mL/min    GFR calc Af Amer >60 >60 mL/min    Comment: (NOTE) The eGFR has been calculated using the CKD EPI equation. This calculation has not been validated in all clinical situations. eGFR's persistently <60 mL/min signify possible Chronic Kidney Disease.    Anion gap 7 5 - 15    Comment: Performed at [facility], [address] [place], [place] [address]  Hepatic function panel     Status: Abnormal   Collection Time: 09/13/17  9:44 PM  Result Value Ref Range   Total Protein 6.6 6.5 - 8.1 g/dL   Albumin 3.0 (L) 3.5 - 5.0 g/dL   AST 52 (H) 15 - 41 U/L   ALT 27 0 - 44 U/L   Alkaline Phosphatase 444 (H)  38 - 126 U/L   Total Bilirubin 1.7 (H) 0.3 - 1.2 mg/dL   Bilirubin, Direct 0.7 (H) 0.0 - 0.2 mg/dL   Indirect Bilirubin 1.0 (H) 0.3 - 0.9 mg/dL    Comment: Performed at [facility], [address] [place], [place] [address]  CBC with Differential     Status: Abnormal   Collection Time: 09/13/17  9:44 PM  Result Value Ref Range   WBC 3.3 (L) 3.8 - 10.6 K/uL   RBC 4.28 (L) 4.40 - 5.90 MIL/uL   Hemoglobin 12.0 (L) 13.0 - 18.0 g/dL   HCT 35.9 (L) 40.0 - 52.0 %   MCV 83.9 80.0 - 100.0 fL   MCH 28.0 26.0 - 34.0 pg   MCHC 33.4 32.0 - 36.0 g/dL   RDW 15.6 (H) 11.5 - 14.5 %   Platelets 137 (L) 150 - 440 K/uL   Neutrophils Relative % 65 %   Neutro Abs 2.2 1.4 - 6.5 K/uL   Lymphocytes Relative 17 %   Lymphs Abs 0.5 (L) 1.0 - 3.6 K/uL   Monocytes Relative 11 %   Monocytes Absolute 0.4 0.2 - 1.0 K/uL   Eosinophils Relative 6 %   Eosinophils Absolute 0.2 0 - 0.7 K/uL   Basophils Relative 1 %   Basophils Absolute 0.0 0 - 0.1 K/uL    Comment: Performed at [facility], [address] [place], [place] [address]  Protime-INR     Status: None   Collection Time: 09/13/17  9:44 PM  Result Value Ref Range   Prothrombin Time 14.6 11.4 - 15.2 seconds   INR 1.15     Comment: Performed at [facility], [address] [place], [place] [address]  Basic  metabolic panel     Status: Abnormal   Collection Time: 09/14/17  4:43 AM  Result Value Ref Range   Sodium 122 (L) 135 - 145 mmol/L   Potassium 4.5 3.5 - 5.1 mmol/L   Chloride 92 (L) 98 - 111 mmol/L   CO2 26 22 - 32 mmol/L   Glucose, Bld 110 (H) 70 - 99 mg/dL   BUN 16 8 - 23 mg/dL   Creatinine, Ser 1.24 0.61 - 1.24 mg/dL   Calcium 8.0 (L) 8.9 - 10.3 mg/dL   GFR calc non Af Amer >60 >60 mL/min   GFR calc Af Amer >60 >60 mL/min    Comment: (NOTE) The eGFR has been calculated using the CKD EPI equation. This calculation has not been validated in all clinical situations. eGFR's persistently <60 mL/min signify possible Chronic Kidney Disease.    Anion gap 4 (L) 5 - 15    Comment: Performed at [facility], [address] [place], [place] [address]  CBC     Status: Abnormal   Collection Time: 09/14/17  4:43 AM  Result Value Ref Range   WBC 3.5 (L) 3.8 - 10.6 K/uL   RBC 3.88 (L) 4.40 - 5.90 MIL/uL   Hemoglobin 10.9 (L) 13.0 - 18.0 g/dL   HCT 32.5 (L) 40.0 - 52.0 %   MCV 83.7 80.0 - 100.0 fL   MCH 28.2 26.0 - 34.0 pg   MCHC 33.7 32.0 - 36.0 g/dL   RDW 16.0 (H) 11.5 - 14.5 %   Platelets 113 (L) 150 - 440 K/uL    Comment: Performed at [facility], [address] [place], [place] [address]  Glucose, capillary     Status: Abnormal   Collection Time: 09/14/17  7:50 AM  Result Value Ref Range   Glucose-Capillary 139 (H) 70 - 99 mg/dL  Glucose, capillary     Status: Abnormal   Collection Time: 09/14/17 11:32 AM  Result Value Ref Range   Glucose-Capillary 139 (H) 70 - 99 mg/dL  Glucose, capillary     Status: Abnormal   Collection Time: 09/14/17  4:33 PM  Result Value Ref Range   Glucose-Capillary 230 (H) 70 - 99 mg/dL  Glucose, capillary     Status: Abnormal   Collection Time: 09/14/17  8:40 PM  Result Value Ref Range   Glucose-Capillary 236 (H) 70 - 99 mg/dL  Basic metabolic panel     Status: Abnormal   Collection Time: 09/15/17  5:06 AM  Result Value Ref  Range   Sodium 130 (L) 135 - 145 mmol/L   Potassium 4.9 3.5 - 5.1 mmol/L   Chloride 96 (L) 98 - 111 mmol/L   CO2 26 22 - 32 mmol/L   Glucose, Bld 59 (L) 70 - 99 mg/dL   BUN 20 8 - 23 mg/dL   Creatinine, Ser 1.87 (H) 0.61 - 1.24 mg/dL   Calcium 8.2 (L) 8.9 - 10.3 mg/dL   GFR calc non Af Amer 37 (L) >60 mL/min   GFR calc Af Amer 43 (L) >60 mL/min    Comment: (NOTE) The eGFR has been calculated using the CKD EPI equation. This calculation has not been validated in all clinical situations. eGFR's persistently <60 mL/min signify possible Chronic Kidney Disease.    Anion gap 8 5 - 15    Comment: Performed at [facility], [address] [place], [place] [address]  Ferritin     Status: None   Collection Time: 09/15/17  5:06 AM  Result Value Ref Range   Ferritin 54 24 - 336 ng/mL    Comment: Performed at [facility], [address] [place], [place] [address]  Iron and TIBC     Status: Abnormal   Collection Time: 09/15/17  5:06 AM  Result Value Ref Range   Iron 55 45 - 182 ug/dL   TIBC 243 (L) 250 - 450 ug/dL   Saturation Ratios 23 17.9 - 39.5 %   UIBC 188 ug/dL    Comment: Performed at [facility], [address] [place], [place] [address]  Vitamin B12     Status: None   Collection Time: 09/15/17  5:06 AM  Result Value Ref Range   Vitamin B-12 731 180 - 914 pg/mL    Comment: (NOTE) This assay is not validated for testing neonatal or myeloproliferative syndrome specimens for Vitamin B12 levels. Performed at [facility], [address], [place], [place] [address]   Folate     Status: None   Collection Time: 09/15/17  5:06 AM  Result Value Ref Range   Folate 15.8 >5.9 ng/mL    Comment: Performed at [facility], [address] [place], [place] [address]  Glucose, capillary     Status: Abnormal   Collection Time: 09/15/17  7:19 AM  Result Value Ref Range   Glucose-Capillary 40 (LL) 70 - 99 mg/dL    Comment: REPEATED TO  VERIFY, CHARGE CREDITED TEST WILL BE CREDITED Performed at [facility], [address] [place], [place] [address]    Comment 1 Notify RN   Glucose, capillary     Status: Abnormal   Collection Time: 09/15/17  7:21 AM  Result Value Ref Range   Glucose-Capillary 41 (LL) 70 - 99 mg/dL  Comment 1 Notify RN   Glucose, capillary     Status: Abnormal   Collection Time: 09/15/17  7:40 AM  Result Value Ref Range   Glucose-Capillary 152 (H) 70 - 99 mg/dL  MRSA PCR Screening     Status: None   Collection Time: 09/15/17  8:17 AM  Result Value Ref Range   MRSA by PCR NEGATIVE NEGATIVE    Comment:        The GeneXpert MRSA Assay (FDA approved for NASAL specimens only), is one component of a comprehensive MRSA colonization surveillance program. It is not intended to diagnose MRSA infection nor to guide or monitor treatment for MRSA infections. Performed at [facility], [address] [place], [place] [address]   Cortisol     Status: None   Collection Time: 09/15/17  9:46 AM  Result Value Ref Range   Cortisol, Plasma 35.0 ug/dL    Comment: (NOTE) AM    6.7 - 22.6 ug/dL PM   <10.0       ug/dL Performed at [facility] [address], [place], [place] [address]   TSH     Status: Abnormal   Collection Time: 09/15/17  9:46 AM  Result Value Ref Range   TSH 5.612 (H) 0.350 - 4.500 uIU/mL    Comment: Performed by a 3rd Generation assay with a functional sensitivity of <=0.01 uIU/mL. Performed at [facility], [address] [place], [place] [address]   Glucose, capillary     Status: None   Collection Time: 09/15/17 11:39 AM  Result Value Ref Range   Glucose-Capillary 99 70 - 99 mg/dL  Glucose, capillary     Status: Abnormal   Collection Time: 09/15/17  3:01 PM  Result Value Ref Range   Glucose-Capillary 169 (H) 70 - 99 mg/dL  Glucose, capillary     Status: Abnormal   Collection Time: 09/15/17  3:47 PM  Result Value Ref Range    Glucose-Capillary 190 (H) 70 - 99 mg/dL  Glucose, capillary     Status: Abnormal   Collection Time: 09/15/17 10:21 PM  Result Value Ref Range   Glucose-Capillary 206 (H) 70 - 99 mg/dL  Magnesium     Status: None   Collection Time: 09/16/17  5:32 AM  Result Value Ref Range   Magnesium 1.8 1.7 - 2.4 mg/dL    Comment: Performed at [facility], [address] [place], [place] [address]  Phosphorus     Status: None   Collection Time: 09/16/17  5:32 AM  Result Value Ref Range   Phosphorus 4.6 2.5 - 4.6 mg/dL    Comment: Performed at [facility], [address] [place], [place] [address]  CBC     Status: Abnormal   Collection Time: 09/16/17  5:32 AM  Result Value Ref Range   WBC 11.9 (H) 3.8 - 10.6 K/uL   RBC 4.06 (L) 4.40 - 5.90 MIL/uL   Hemoglobin 11.4 (L) 13.0 - 18.0 g/dL   HCT 34.3 (L) 40.0 - 52.0 %   MCV 84.6 80.0 - 100.0 fL   MCH 28.0 26.0 - 34.0 pg   MCHC 33.1 32.0 - 36.0 g/dL   RDW 16.6 (H) 11.5 - 14.5 %   Platelets 106 (L) 150 - 440 K/uL    Comment: Performed at [facility], [address], [place], [place] [address]  Comprehensive metabolic panel     Status: Abnormal   Collection Time: 09/16/17  5:37 AM  Result Value  Ref Range   Sodium 126 (L) 135 - 145 mmol/L   Potassium 5.5 (H) 3.5 - 5.1 mmol/L   Chloride 95 (L) 98 - 111 mmol/L   CO2 25 22 - 32 mmol/L   Glucose, Bld 243 (H) 70 - 99 mg/dL   BUN 33 (H) 8 - 23 mg/dL   Creatinine, Ser 1.59 (H) 0.61 - 1.24 mg/dL   Calcium 7.5 (L) 8.9 - 10.3 mg/dL   Total Protein 5.3 (L) 6.5 - 8.1 g/dL   Albumin 2.3 (L) 3.5 - 5.0 g/dL   AST 26 15 - 41 U/L   ALT 14 0 - 44 U/L   Alkaline Phosphatase 212 (H) 38 - 126 U/L   Total Bilirubin 3.2 (H) 0.3 - 1.2 mg/dL   GFR calc non Af Amer 45 (L) >60 mL/min   GFR calc Af Amer 52 (L) >60 mL/min    Comment: (NOTE) The eGFR has been calculated using the CKD EPI equation. This calculation has not been validated in all clinical situations. eGFR's persistently <60  mL/min signify possible Chronic Kidney Disease.    Anion gap 6 5 - 15    Comment: Performed at [facility], [address] [place], [place] [address]  Glucose, capillary     Status: Abnormal   Collection Time: 09/16/17  7:37 AM  Result Value Ref Range   Glucose-Capillary 212 (H) 70 - 99 mg/dL  T4     Status: Abnormal   Collection Time: 09/16/17  7:52 AM  Result Value Ref Range   T4, Total 4.3 (L) 4.5 - 12.0 ug/dL    Comment: (NOTE) Performed At: [facility] [address] [place], [place] [id number] Rush Farmer MD [id number]   T3, free     Status: Abnormal   Collection Time: 09/16/17  7:52 AM  Result Value Ref Range   T3, Free 1.4 (L) 2.0 - 4.4 pg/mL    Comment: (NOTE) Performed At: [facility] [place], [place] [id number] Rush Farmer MD [id number]   Glucose, capillary     Status: Abnormal   Collection Time: 09/16/17  1:14 PM  Result Value Ref Range   Glucose-Capillary 250 (H) 70 - 99 mg/dL  Glucose, capillary     Status: Abnormal   Collection Time: 09/16/17  3:52 PM  Result Value Ref Range   Glucose-Capillary 298 (H) 70 - 99 mg/dL  Glucose, capillary     Status: Abnormal   Collection Time: 09/16/17  9:28 PM  Result Value Ref Range   Glucose-Capillary 430 (H) 70 - 99 mg/dL   Comment 1 Call MD NNP PA CNM   Glucose, capillary     Status: Abnormal   Collection Time: 09/16/17 11:04 PM  Result Value Ref Range   Glucose-Capillary 443 (H) 70 - 99 mg/dL  Glucose, capillary     Status: Abnormal   Collection Time: 09/17/17 12:37 AM  Result Value Ref Range   Glucose-Capillary 387 (H) 70 - 99 mg/dL  Basic metabolic panel     Status: Abnormal   Collection Time: 09/17/17  3:31 AM  Result Value Ref Range   Sodium 124 (L) 135 - 145 mmol/L   Potassium 5.3 (H) 3.5 - 5.1 mmol/L   Chloride 92 (L) 98 - 111 mmol/L   CO2 26 22 - 32 mmol/L   Glucose, Bld 279 (H) 70 - 99 mg/dL   BUN 45 (H) 8 - 23 mg/dL   Creatinine, Ser 1.46 (H)  0.61 - 1.24 mg/dL  Calcium 7.7 (L) 8.9 - 10.3 mg/dL   GFR calc non Af Amer 50 (L) >60 mL/min   GFR calc Af Amer 58 (L) >60 mL/min    Comment: (NOTE) The eGFR has been calculated using the CKD EPI equation. This calculation has not been validated in all clinical situations. eGFR's persistently <60 mL/min signify possible Chronic Kidney Disease.    Anion gap 6 5 - 15    Comment: Performed at [facility], [address] [place], [place] [address]  CBC     Status: Abnormal   Collection Time: 09/17/17  3:31 AM  Result Value Ref Range   WBC 3.8 3.8 - 10.6 K/uL   RBC 3.35 (L) 4.40 - 5.90 MIL/uL   Hemoglobin 9.6 (L) 13.0 - 18.0 g/dL   HCT 28.3 (L) 40.0 - 52.0 %   MCV 84.4 80.0 - 100.0 fL   MCH 28.7 26.0 - 34.0 pg   MCHC 34.0 32.0 - 36.0 g/dL   RDW 16.4 (H) 11.5 - 14.5 %   Platelets 68 (L) 150 - 440 K/uL    Comment: RESULT REPEATED AND VERIFIED Performed at [facility], [address] [place], [place] [address]   Glucose, capillary     Status: Abnormal   Collection Time: 09/17/17  7:09 AM  Result Value Ref Range   Glucose-Capillary 190 (H) 70 - 99 mg/dL  Glucose, capillary     Status: Abnormal   Collection Time: 09/17/17  7:25 AM  Result Value Ref Range   Glucose-Capillary 175 (H) 70 - 99 mg/dL  Glucose, capillary     Status: Abnormal   Collection Time: 09/17/17 11:57 AM  Result Value Ref Range   Glucose-Capillary 237 (H) 70 - 99 mg/dL  Basic metabolic panel     Status: Abnormal   Collection Time: 09/17/17 12:27 PM  Result Value Ref Range   Sodium 124 (L) 135 - 145 mmol/L   Potassium 5.5 (H) 3.5 - 5.1 mmol/L   Chloride 93 (L) 98 - 111 mmol/L   CO2 24 22 - 32 mmol/L   Glucose, Bld 255 (H) 70 - 99 mg/dL   BUN 45 (H) 8 - 23 mg/dL   Creatinine, Ser 1.27 (H) 0.61 - 1.24 mg/dL   Calcium 7.8 (L) 8.9 - 10.3 mg/dL   GFR calc non Af Amer 59 (L) >60 mL/min   GFR calc Af Amer >60 >60 mL/min    Comment: (NOTE) The eGFR has been calculated using the CKD EPI  equation. This calculation has not been validated in all clinical situations. eGFR's persistently <60 mL/min signify possible Chronic Kidney Disease.    Anion gap 7 5 - 15    Comment: Performed at [facility], [address] [place], [place] [address]  Glucose, capillary     Status: Abnormal   Collection Time: 09/17/17  4:40 PM  Result Value Ref Range   Glucose-Capillary 196 (H) 70 - 99 mg/dL  Glucose, capillary     Status: Abnormal   Collection Time: 09/17/17  9:55 PM  Result Value Ref Range   Glucose-Capillary 273 (H) 70 - 99 mg/dL  Basic metabolic panel     Status: Abnormal   Collection Time: 09/18/17  4:53 AM  Result Value Ref Range   Sodium 130 (L) 135 - 145 mmol/L   Potassium 4.2 3.5 - 5.1 mmol/L   Chloride 97 (L) 98 - 111 mmol/L   CO2 26 22 - 32 mmol/L   Glucose, Bld 180 (H) 70 -  99 mg/dL   BUN 49 (H) 8 - 23 mg/dL   Creatinine, Ser 1.16 0.61 - 1.24 mg/dL   Calcium 8.1 (L) 8.9 - 10.3 mg/dL   GFR calc non Af Amer >60 >60 mL/min   GFR calc Af Amer >60 >60 mL/min    Comment: (NOTE) The eGFR has been calculated using the CKD EPI equation. This calculation has not been validated in all clinical situations. eGFR's persistently <60 mL/min signify possible Chronic Kidney Disease.    Anion gap 7 5 - 15    Comment: Performed at [facility], [address] [place], [place] [address]  Glucose, capillary     Status: None   Collection Time: 09/18/17  7:44 AM  Result Value Ref Range   Glucose-Capillary 92 70 - 99 mg/dL   Comment 1 Notify RN   Lipase, blood     Status: None   Collection Time: 09/20/17  5:28 PM  Result Value Ref Range   Lipase 40 11 - 51 U/L    Comment: Performed at [facility], [address] [place], [place] [address]  Comprehensive metabolic panel     Status: Abnormal   Collection Time: 09/20/17  5:28 PM  Result Value Ref Range   Sodium 128 (L) 135 - 145 mmol/L   Potassium 4.8 3.5 - 5.1 mmol/L   Chloride 94 (L) 98 - 111  mmol/L   CO2 27 22 - 32 mmol/L   Glucose, Bld 194 (H) 70 - 99 mg/dL   BUN 32 (H) 8 - 23 mg/dL   Creatinine, Ser 1.15 0.61 - 1.24 mg/dL   Calcium 7.9 (L) 8.9 - 10.3 mg/dL   Total Protein 5.8 (L) 6.5 - 8.1 g/dL   Albumin 2.6 (L) 3.5 - 5.0 g/dL   AST 37 15 - 41 U/L   ALT 32 0 - 44 U/L   Alkaline Phosphatase 391 (H) 38 - 126 U/L   Total Bilirubin 3.9 (H) 0.3 - 1.2 mg/dL   GFR calc non Af Amer >60 >60 mL/min   GFR calc Af Amer >60 >60 mL/min    Comment: (NOTE) The eGFR has been calculated using the CKD EPI equation. This calculation has not been validated in all clinical situations. eGFR's persistently <60 mL/min signify possible Chronic Kidney Disease.    Anion gap 7 5 - 15    Comment: Performed at [facility], [address] [place], [place] [address]  CBC     Status: Abnormal   Collection Time: 09/20/17  5:28 PM  Result Value Ref Range   WBC 7.6 3.8 - 10.6 K/uL   RBC 4.48 4.40 - 5.90 MIL/uL   Hemoglobin 12.7 (L) 13.0 - 18.0 g/dL   HCT 37.5 (L) 40.0 - 52.0 %   MCV 83.7 80.0 - 100.0 fL   MCH 28.4 26.0 - 34.0 pg   MCHC 33.9 32.0 - 36.0 g/dL   RDW 15.8 (H) 11.5 - 14.5 %   Platelets 125 (L) 150 - 440 K/uL    Comment: Performed at [facility], [address] [place], [place] [address]  POCT glycosylated hemoglobin (Hb A1C)     Status: Abnormal   Collection Time: 09/23/17 11:02 AM  Result Value Ref Range   Hemoglobin A1C 8.9 (A) 4.0 - 5.6 %   HbA1c POC (<> result, manual entry)     HbA1c, POC (prediabetic range)     HbA1c, POC (controlled diabetic range)    Comprehensive Metabolic Panel (CMET)  Status: Abnormal   Collection Time: 09/23/17  1:36 PM  Result Value Ref Range   Glucose 139 (H) 65 - 99 mg/dL   BUN 39 (H) 8 - 27 mg/dL   Creatinine, Ser 1.51 (H) 0.76 - 1.27 mg/dL   GFR calc non Af Amer 49 (L) >59 mL/min/1.73   GFR calc Af Amer 57 (L) >59 mL/min/1.73   BUN/Creatinine Ratio 26 (H) 10 - 24   Sodium 122 (L) 134 - 144 mmol/L   Potassium 5.5  (H) 3.5 - 5.2 mmol/L   Chloride 85 (L) 96 - 106 mmol/L   CO2 22 20 - 29 mmol/L   Calcium 7.9 (L) 8.6 - 10.2 mg/dL   Total Protein 5.6 (L) 6.0 - 8.5 g/dL   Albumin 2.8 (L) 3.6 - 4.8 g/dL   Globulin, Total 2.8 1.5 - 4.5 g/dL   Albumin/Globulin Ratio 1.0 (L) 1.2 - 2.2   Bilirubin Total 3.9 (H) 0.0 - 1.2 mg/dL   Alkaline Phosphatase 430 (H) 39 - 117 IU/L   AST 34 0 - 40 IU/L   ALT 22 0 - 44 IU/L  INR/PT     Status: Abnormal   Collection Time: 09/23/17  1:36 PM  Result Value Ref Range   INR 1.2 0.8 - 1.2    Comment: Reference interval is for non-anticoagulated patients. Suggested INR therapeutic range for Vitamin K antagonist therapy:    Standard Dose (moderate intensity                   therapeutic range):       2.0 - 3.0    Higher intensity therapeutic range       2.5 - 3.5    Prothrombin Time 12.4 (H) 9.1 - 12.0 sec  Basic Metabolic Panel (BMET)     Status: Abnormal   Collection Time: 09/24/17  2:58 PM  Result Value Ref Range   Glucose 133 (H) 65 - 99 mg/dL   BUN 37 (H) 8 - 27 mg/dL   Creatinine, Ser 1.35 (H) 0.76 - 1.27 mg/dL   GFR calc non Af Amer 56 (L) >59 mL/min/1.73   GFR calc Af Amer 65 >59 mL/min/1.73   BUN/Creatinine Ratio 27 (H) 10 - 24   Sodium 121 (L) 134 - 144 mmol/L   Potassium 5.5 (H) 3.5 - 5.2 mmol/L   Chloride 85 (L) 96 - 106 mmol/L   CO2 23 20 - 29 mmol/L   Calcium 8.1 (L) 8.6 - 10.2 mg/dL  Thyroid Panel With TSH     Status: Abnormal   Collection Time: 09/25/17  8:55 AM  Result Value Ref Range   TSH 12.550 (H) 0.450 - 4.500 uIU/mL   T4, Total 7.5 4.5 - 12.0 ug/dL   T3 Uptake Ratio 32 24 - 39 %   Free Thyroxine Index 2.4 1.2 - 4.9    Comment: (NOTE) Performed At: [facility] [place], [place] [id number] Rush Farmer MD UU:[id number]   CBC with Differential/Platelet     Status: Abnormal   Collection Time: 09/25/17  8:55 AM  Result Value Ref Range   WBC 4.7 3.8 - 10.6 K/uL   RBC 3.72 (L) 4.40 - 5.90 MIL/uL   Hemoglobin 10.6  (L) 13.0 - 18.0 g/dL   HCT 31.4 (L) 40.0 - 52.0 %   MCV 84.5 80.0 - 100.0 fL   MCH 28.5 26.0 - 34.0 pg   MCHC 33.8 32.0 - 36.0 g/dL   RDW 16.3 (H) 11.5 - 14.5 %  Platelets 176 150 - 440 K/uL   Neutrophils Relative % 78 %   Neutro Abs 3.7 1.4 - 6.5 K/uL   Lymphocytes Relative 9 %   Lymphs Abs 0.4 (L) 1.0 - 3.6 K/uL   Monocytes Relative 10 %   Monocytes Absolute 0.5 0.2 - 1.0 K/uL   Eosinophils Relative 2 %   Eosinophils Absolute 0.1 0 - 0.7 K/uL   Basophils Relative 1 %   Basophils Absolute 0.0 0 - 0.1 K/uL    Comment: Performed at [facility], [address] [place], [place] [address]  Comprehensive metabolic panel     Status: Abnormal   Collection Time: 09/25/17  8:55 AM  Result Value Ref Range   Sodium 120 (L) 135 - 145 mmol/L   Potassium 5.1 3.5 - 5.1 mmol/L   Chloride 86 (L) 98 - 111 mmol/L   CO2 26 22 - 32 mmol/L   Glucose, Bld 303 (H) 70 - 99 mg/dL   BUN 38 (H) 8 - 23 mg/dL   Creatinine, Ser 1.48 (H) 0.61 - 1.24 mg/dL   Calcium 8.1 (L) 8.9 - 10.3 mg/dL   Total Protein 6.1 (L) 6.5 - 8.1 g/dL   Albumin 2.3 (L) 3.5 - 5.0 g/dL   AST 29 15 - 41 U/L   ALT 19 0 - 44 U/L   Alkaline Phosphatase 347 (H) 38 - 126 U/L   Total Bilirubin 2.9 (H) 0.3 - 1.2 mg/dL   GFR calc non Af Amer 49 (L) >60 mL/min   GFR calc Af Amer 57 (L) >60 mL/min    Comment: (NOTE) The eGFR has been calculated using the CKD EPI equation. This calculation has not been validated in all clinical situations. eGFR's persistently <60 mL/min signify possible Chronic Kidney Disease.    Anion gap 8 5 - 15    Comment: Performed at [facility], [address] [place], [place] [address]  CBC with Differential/Platelet     Status: Abnormal   Collection Time: 10/09/17  9:22 AM  Result Value Ref Range   WBC 5.2 4.0 - 10.5 K/uL   RBC 3.83 (L) 4.22 - 5.81 MIL/uL   Hemoglobin 10.6 (L) 13.0 - 17.0 g/dL   HCT 32.0 (L) 39.0 - 52.0 %   MCV 83.6 80.0 - 100.0 fL   MCH 27.7 26.0 - 34.0 pg   MCHC 33.1  30.0 - 36.0 g/dL   RDW 16.3 (H) 11.5 - 15.5 %   Platelets 134 (L) 150 - 400 K/uL   nRBC 0.0 0.0 - 0.2 %   Neutrophils Relative % 77 %   Neutro Abs 3.9 1.7 - 7.7 K/uL   Lymphocytes Relative 10 %   Lymphs Abs 0.5 (L) 0.7 - 4.0 K/uL   Monocytes Relative 11 %   Monocytes Absolute 0.6 0.1 - 1.0 K/uL   Eosinophils Relative 2 %   Eosinophils Absolute 0.1 0.0 - 0.5 K/uL   Basophils Relative 0 %   Basophils Absolute 0.0 0.0 - 0.1 K/uL   Immature Granulocytes 0 %   Abs Immature Granulocytes 0.01 0.00 - 0.07 K/uL    Comment: Performed at [facility], [address] [place], [place] [address]  Comprehensive metabolic panel     Status: Abnormal   Collection Time: 10/09/17  9:22 AM  Result Value Ref Range   Sodium 120 (L) 135 - 145 mmol/L   Potassium 5.3 (H) 3.5 - 5.1 mmol/L   Chloride 85 (L) 98 - 111 mmol/L   CO2  28 22 - 32 mmol/L   Glucose, Bld 86 70 - 99 mg/dL   BUN 31 (H) 8 - 23 mg/dL   Creatinine, Ser 1.46 (H) 0.61 - 1.24 mg/dL   Calcium 8.0 (L) 8.9 - 10.3 mg/dL   Total Protein 7.0 6.5 - 8.1 g/dL   Albumin 2.2 (L) 3.5 - 5.0 g/dL   AST 46 (H) 15 - 41 U/L   ALT 18 0 - 44 U/L   Alkaline Phosphatase 343 (H) 38 - 126 U/L   Total Bilirubin 2.1 (H) 0.3 - 1.2 mg/dL   GFR calc non Af Amer 50 (L) >60 mL/min   GFR calc Af Amer 58 (L) >60 mL/min    Comment: (NOTE) The eGFR has been calculated using the CKD EPI equation. This calculation has not been validated in all clinical situations. eGFR's persistently <60 mL/min signify possible Chronic Kidney Disease.    Anion gap 7 5 - 15    Comment: Performed at [facility], [address] [place], [place] [address]  Thyroid Panel With TSH     Status: Abnormal   Collection Time: 10/09/17  9:22 AM  Result Value Ref Range   TSH 19.570 (H) 0.450 - 4.500 uIU/mL   T4, Total 6.1 4.5 - 12.0 ug/dL   T3 Uptake Ratio 34 24 - 39 %   Free Thyroxine Index 2.1 1.2 - 4.9    Comment: (NOTE) Performed At: [facility] [place], [place] [id number] Rush Farmer MD [id number]   Lipase, blood     Status: None   Collection Time: 10/18/17 10:03 AM  Result Value Ref Range   Lipase 31 11 - 51 U/L    Comment: Performed at [facility], [address] [name], [place] [address]  Comprehensive metabolic panel     Status: Abnormal   Collection Time: 10/18/17 10:03 AM  Result Value Ref Range   Sodium 121 (L) 135 - 145 mmol/L   Potassium 5.6 (H) 3.5 - 5.1 mmol/L   Chloride 86 (L) 98 - 111 mmol/L   CO2 24 22 - 32 mmol/L   Glucose, Bld 295 (H) 70 - 99 mg/dL   BUN 19 8 - 23 mg/dL   Creatinine, Ser 1.43 (H) 0.61 - 1.24 mg/dL   Calcium 7.7 (L) 8.9 - 10.3 mg/dL   Total Protein 6.7 6.5 - 8.1 g/dL   Albumin 2.0 (L) 3.5 - 5.0 g/dL   AST 31 15 - 41 U/L   ALT 14 0 - 44 U/L   Alkaline Phosphatase 289 (H) 38 - 126 U/L   Total Bilirubin 3.7 (H) 0.3 - 1.2 mg/dL   GFR calc non Af Amer 51 (L) >60 mL/min   GFR calc Af Amer 60 (L) >60 mL/min    Comment: (NOTE) The eGFR has been calculated using the CKD EPI equation. This calculation has not been validated in all clinical situations. eGFR's persistently <60 mL/min signify possible Chronic Kidney Disease.    Anion gap 11 5 - 15    Comment: Performed at [facility], [address] [place], [place] [address]  CBC     Status: Abnormal   Collection Time: 10/18/17 10:03 AM  Result Value Ref Range   WBC 6.1 4.0 - 10.5 K/uL   RBC 4.00 (L) 4.22 - 5.81 MIL/uL   Hemoglobin 11.5 (L) 13.0 - 17.0 g/dL   HCT 33.9 (L) 39.0 - 52.0 %   MCV 84.8 80.0 - 100.0 fL   MCH 28.8  26.0 - 34.0 pg   MCHC 33.9 30.0 - 36.0 g/dL   RDW 18.2 (H) 11.5 - 15.5 %   Platelets 145 (L) 150 - 400 K/uL   nRBC 0.0 0.0 - 0.2 %    Comment: Performed at [facility], [address] [place], [place] [address]  Urinalysis, Complete w Microscopic     Status: Abnormal   Collection Time: 10/18/17 10:03 AM  Result Value Ref Range   Color, Urine AMBER (A) YELLOW    Comment:  BIOCHEMICALS MAY BE AFFECTED BY COLOR   APPearance CLEAR (A) CLEAR   Specific Gravity, Urine 1.012 1.005 - 1.030   pH 5.0 5.0 - 8.0   Glucose, UA 150 (A) NEGATIVE mg/dL   Hgb urine dipstick NEGATIVE NEGATIVE   Bilirubin Urine NEGATIVE NEGATIVE   Ketones, ur 5 (A) NEGATIVE mg/dL   Protein, ur NEGATIVE NEGATIVE mg/dL   Nitrite NEGATIVE NEGATIVE   Leukocytes, UA NEGATIVE NEGATIVE   RBC / HPF 0-5 0 - 5 RBC/hpf   WBC, UA 0-5 0 - 5 WBC/hpf   Bacteria, UA NONE SEEN NONE SEEN   Squamous Epithelial / LPF 0-5 0 - 5   Mucus PRESENT    Hyaline Casts, UA PRESENT     Comment: Performed at [facility], [address] [place], [place] [address]  Ammonia     Status: None   Collection Time: 10/18/17 10:03 AM  Result Value Ref Range   Ammonia 17 9 - 35 umol/L    Comment: Performed at [facility], [address] [place], [place] [address]  Protime-INR     Status: Abnormal   Collection Time: 10/18/17 10:03 AM  Result Value Ref Range   Prothrombin Time 17.5 (H) 11.4 - 15.2 seconds   INR 1.45     Comment: Performed at [facility] [address] [place], [place] [address]  Osmolality     Status: Abnormal   Collection Time: 10/18/17  4:15 PM  Result Value Ref Range   Osmolality 268 (L) 275 - 295 mOsm/kg    Comment: Performed at [facility], [address], [place], [place] [address]  Glucose, capillary     Status: Abnormal   Collection Time: 10/19/17 12:21 AM  Result Value Ref Range   Glucose-Capillary 326 (H) 70 - 99 mg/dL  Basic metabolic panel     Status: Abnormal   Collection Time: 10/19/17  3:59 AM  Result Value Ref Range   Sodium 122 (L) 135 - 145 mmol/L   Potassium 5.3 (H) 3.5 - 5.1 mmol/L   Chloride 88 (L) 98 - 111 mmol/L   CO2 24 22 - 32 mmol/L   Glucose, Bld 303 (H) 70 - 99 mg/dL   BUN 22 8 - 23 mg/dL   Creatinine, Ser 1.63 (H) 0.61 - 1.24 mg/dL   Calcium 7.5 (L) 8.9 - 10.3 mg/dL   GFR calc non Af Amer 44 (L) >60 mL/min   GFR calc Af Amer  51 (L) >60 mL/min    Comment: (NOTE) The eGFR has been calculated using the CKD EPI equation. This calculation has not been validated in all clinical situations. eGFR's persistently <60 mL/min signify possible Chronic Kidney Disease.    Anion gap 10 5 - 15    Comment: Performed at [facility], [address] [place], [place] [address]  CBC     Status: Abnormal   Collection Time: 10/19/17  3:59 AM  Result Value Ref Range  WBC 6.7 4.0 - 10.5 K/uL   RBC 3.67 (L) 4.22 - 5.81 MIL/uL   Hemoglobin 10.4 (L) 13.0 - 17.0 g/dL   HCT 31.4 (L) 39.0 - 52.0 %   MCV 85.6 80.0 - 100.0 fL   MCH 28.3 26.0 - 34.0 pg   MCHC 33.1 30.0 - 36.0 g/dL   RDW 18.4 (H) 11.5 - 15.5 %   Platelets 137 (L) 150 - 400 K/uL   nRBC 0.0 0.0 - 0.2 %    Comment: Performed at [facility], [address] [place], [place] [address]  Glucose, capillary     Status: Abnormal   Collection Time: 10/19/17  7:40 AM  Result Value Ref Range   Glucose-Capillary 303 (H) 70 - 99 mg/dL  Sodium     Status: Abnormal   Collection Time: 10/19/17 10:41 AM  Result Value Ref Range   Sodium 122 (L) 135 - 145 mmol/L    Comment: Performed at [facility], [address] [place], [place] [address]  Glucose, capillary     Status: Abnormal   Collection Time: 10/19/17 11:34 AM  Result Value Ref Range   Glucose-Capillary 286 (H) 70 - 99 mg/dL  Sodium     Status: Abnormal   Collection Time: 10/19/17  4:20 PM  Result Value Ref Range   Sodium 120 (L) 135 - 145 mmol/L    Comment: Performed at [facility], [address] [place], [place] [address]  Glucose, capillary     Status: Abnormal   Collection Time: 10/19/17  4:57 PM  Result Value Ref Range   Glucose-Capillary 218 (H) 70 - 99 mg/dL  Glucose, capillary     Status: Abnormal   Collection Time: 10/19/17  9:44 PM  Result Value Ref Range   Glucose-Capillary 285 (H) 70 - 99 mg/dL  Sodium     Status: Abnormal   Collection Time: 10/19/17  9:59 PM    Result Value Ref Range   Sodium 123 (L) 135 - 145 mmol/L    Comment: Performed at [facility], [address] [place], [place] [address]  Comprehensive metabolic panel     Status: Abnormal   Collection Time: 10/20/17  3:00 AM  Result Value Ref Range   Sodium 125 (L) 135 - 145 mmol/L   Potassium 4.0 3.5 - 5.1 mmol/L   Chloride 89 (L) 98 - 111 mmol/L   CO2 27 22 - 32 mmol/L   Glucose, Bld 296 (H) 70 - 99 mg/dL   BUN 21 8 - 23 mg/dL   Creatinine, Ser 1.26 (H) 0.61 - 1.24 mg/dL   Calcium 7.7 (L) 8.9 - 10.3 mg/dL   Total Protein 5.6 (L) 6.5 - 8.1 g/dL   Albumin 2.4 (L) 3.5 - 5.0 g/dL   AST 26 15 - 41 U/L   ALT 13 0 - 44 U/L   Alkaline Phosphatase 232 (H) 38 - 126 U/L   Total Bilirubin 2.4 (H) 0.3 - 1.2 mg/dL   GFR calc non Af Amer 60 (L) >60 mL/min   GFR calc Af Amer >60 >60 mL/min    Comment: (NOTE) The eGFR has been calculated using the CKD EPI equation. This calculation has not been validated in all clinical situations. eGFR's persistently <60 mL/min signify possible Chronic Kidney Disease.    Anion gap 9 5 - 15    Comment: Performed at [facility], [place], [place] [address]  Glucose, capillary     Status: Abnormal  Collection Time: 10/20/17  7:42 AM  Result Value Ref Range   Glucose-Capillary 322 (H) 70 - 99 mg/dL  Sodium     Status: Abnormal   Collection Time: 10/20/17  9:44 AM  Result Value Ref Range   Sodium 121 (L) 135 - 145 mmol/L    Comment: Performed at [facility], [address] [place], [place] [address]  Glucose, capillary     Status: Abnormal   Collection Time: 10/20/17 12:03 PM  Result Value Ref Range   Glucose-Capillary 347 (H) 70 - 99 mg/dL  Sodium     Status: Abnormal   Collection Time: 10/20/17  5:25 PM  Result Value Ref Range   Sodium 125 (L) 135 - 145 mmol/L    Comment: Performed at [facility], [address] [place], [place] [address]  Glucose, capillary     Status: Abnormal    Collection Time: 10/20/17  5:43 PM  Result Value Ref Range   Glucose-Capillary 331 (H) 70 - 99 mg/dL  Glucose, capillary     Status: Abnormal   Collection Time: 10/20/17  9:15 PM  Result Value Ref Range   Glucose-Capillary 352 (H) 70 - 99 mg/dL  Sodium     Status: Abnormal   Collection Time: 10/21/17 12:34 AM  Result Value Ref Range   Sodium 126 (L) 135 - 145 mmol/L    Comment: Performed at [facility], [address] [place], [place] [address]  Sodium     Status: Abnormal   Collection Time: 10/21/17  4:27 AM  Result Value Ref Range   Sodium 127 (L) 135 - 145 mmol/L    Comment: Performed at [facility], [address] [place], [place] [address]  Glucose, capillary     Status: Abnormal   Collection Time: 10/21/17  7:44 AM  Result Value Ref Range   Glucose-Capillary 369 (H) 70 - 99 mg/dL  Sodium     Status: Abnormal   Collection Time: 10/21/17  9:54 AM  Result Value Ref Range   Sodium 128 (L) 135 - 145 mmol/L    Comment: Performed at [facility], [address] [place], [place] [address]  Glucose, capillary     Status: Abnormal   Collection Time: 10/21/17 11:39 AM  Result Value Ref Range   Glucose-Capillary 277 (H) 70 - 99 mg/dL  Sodium     Status: Abnormal   Collection Time: 10/21/17  4:15 PM  Result Value Ref Range   Sodium 126 (L) 135 - 145 mmol/L    Comment: Performed at [facility], [address] [place], [place] [address]  Glucose, capillary     Status: Abnormal   Collection Time: 10/21/17  4:56 PM  Result Value Ref Range   Glucose-Capillary 316 (H) 70 - 99 mg/dL  Glucose, capillary     Status: Abnormal   Collection Time: 10/21/17  8:28 PM  Result Value Ref Range   Glucose-Capillary 221 (H) 70 - 99 mg/dL  Sodium     Status: Abnormal   Collection Time: 10/21/17  9:48 PM  Result Value Ref Range   Sodium 129 (L) 135 - 145 mmol/L    Comment: Performed at [facility], [address] [place], [place] [address]    BUN     Status: None   Collection Time: 10/22/17  4:19 AM  Result Value Ref Range   BUN 15 8 - 23 mg/dL    Comment: Performed at [facility], [address] [place], [place] [address]  Creatinine, serum     Status: None   Collection Time: 10/22/17  4:19 AM  Result Value Ref Range   Creatinine, Ser 1.08 0.61 - 1.24 mg/dL   GFR calc non Af Amer >60 >60 mL/min   GFR calc Af Amer >60 >60 mL/min    Comment: (NOTE) The eGFR has been calculated using the CKD EPI equation. This calculation has not been validated in all clinical situations. eGFR's persistently <60 mL/min signify possible Chronic Kidney Disease. Performed at [facility], [address], [place], [place] [address]   Sodium     Status: Abnormal   Collection Time: 10/22/17  4:24 AM  Result Value Ref Range   Sodium 130 (L) 135 - 145 mmol/L    Comment: Performed at [facility], [address] [place], [place] [address]  Potassium     Status: Abnormal   Collection Time: 10/22/17  4:24 AM  Result Value Ref Range   Potassium 2.9 (L) 3.5 - 5.1 mmol/L    Comment: Performed at [facility], [address], [place], [place] [address]  Phosphorus     Status: None   Collection Time: 10/22/17  4:24 AM  Result Value Ref Range   Phosphorus 2.8 2.5 - 4.6 mg/dL    Comment: Performed at [facility], [address], [place], [place] [address]  Magnesium     Status: Abnormal   Collection Time: 10/22/17  4:24 AM  Result Value Ref Range   Magnesium 1.1 (L) 1.7 - 2.4 mg/dL    Comment: Performed at [facility], [address] [place], [place] [address]  Glucose, capillary     Status: Abnormal   Collection Time: 10/22/17  7:26 AM  Result Value Ref Range   Glucose-Capillary 138 (H) 70 - 99 mg/dL  Sodium     Status: Abnormal   Collection Time: 10/22/17  9:59 AM  Result Value Ref Range   Sodium 129 (L) 135 - 145 mmol/L    Comment: Performed at [facility], [address] [place], [place] [address]  Glucose, capillary     Status: Abnormal   Collection Time: 10/22/17 11:40 AM  Result Value Ref Range   Glucose-Capillary 190 (H) 70 - 99 mg/dL  Ammonia     Status: Abnormal   Collection Time: 10/22/17 12:53 PM  Result Value Ref Range   Ammonia 40 (H) 9 - 35 umol/L    Comment: Performed at [facility], [address] [place], [place] [address]  Sodium     Status: Abnormal   Collection Time: 10/22/17  4:18 PM  Result Value Ref Range   Sodium 129 (L) 135 - 145 mmol/L    Comment: Performed at [facility], [address] [place], [place] [address]  Thyroid Panel With TSH     Status: Abnormal   Collection Time: 10/23/17  1:21 PM  Result Value Ref Range   TSH 11.450 (H) 0.450 - 4.500 uIU/mL   T4, Total 4.7 4.5 - 12.0 ug/dL   T3 Uptake Ratio 30 24 - 39 %   Free Thyroxine Index 1.4 1.2 - 4.9    Comment: (NOTE) Performed At: [facility], [place] [id number] Rush Farmer MD [place]:[id number]   Comprehensive metabolic panel     Status: Abnormal   Collection Time: 10/23/17  1:21 PM  Result Value Ref Range   Sodium 128 (L) 135 - 145 mmol/L   Potassium  4.5 3.5 - 5.1 mmol/L   Chloride 89 (L) 98 - 111 mmol/L   CO2 30 22 - 32 mmol/L   Glucose, Bld 234 (H) 70 - 99 mg/dL   BUN 16 8 - 23 mg/dL   Creatinine, Ser 1.06 0.61 - 1.24 mg/dL   Calcium 8.1 (L) 8.9 - 10.3 mg/dL   Total Protein 6.8 6.5 - 8.1 g/dL   Albumin 3.3 (L) 3.5 - 5.0 g/dL   AST 64 (H) 15 - 41 U/L   ALT 25 0 - 44 U/L   Alkaline Phosphatase 264 (H) 38 - 126 U/L   Total Bilirubin 3.7 (H) 0.3 - 1.2 mg/dL   GFR calc non Af Amer >60 >60 mL/min   GFR calc Af Amer >60 >60 mL/min    Comment: (NOTE) The eGFR has been calculated using the CKD EPI equation. This calculation has not been validated in all clinical situations. eGFR's persistently <60 mL/min signify possible Chronic Kidney Disease.    Anion gap 9 5 - 15    Comment: Performed at  [facility], [address] [place], [place] [address]  CBC with Differential/Platelet     Status: Abnormal   Collection Time: 10/23/17  1:25 PM  Result Value Ref Range   WBC 3.9 (L) 4.0 - 10.5 K/uL   RBC 3.27 (L) 4.22 - 5.81 MIL/uL   Hemoglobin 9.2 (L) 13.0 - 17.0 g/dL   HCT 28.1 (L) 39.0 - 52.0 %   MCV 85.9 80.0 - 100.0 fL   MCH 28.1 26.0 - 34.0 pg   MCHC 32.7 30.0 - 36.0 g/dL   RDW 18.0 (H) 11.5 - 15.5 %   Platelets 95 (L) 150 - 400 K/uL   nRBC 0.0 0.0 - 0.2 %   Neutrophils Relative % 79 %   Neutro Abs 3.1 1.7 - 7.7 K/uL   Lymphocytes Relative 10 %   Lymphs Abs 0.4 (L) 0.7 - 4.0 K/uL   Monocytes Relative 9 %   Monocytes Absolute 0.4 0.1 - 1.0 K/uL   Eosinophils Relative 1 %   Eosinophils Absolute 0.0 0.0 - 0.5 K/uL   Basophils Relative 1 %   Basophils Absolute 0.0 0.0 - 0.1 K/uL   Immature Granulocytes 0 %   Abs Immature Granulocytes 0.01 0.00 - 0.07 K/uL    Comment: Performed at [facility], [address] [place], [place] [address]   Objective  Body mass index is 21.28 kg/m. Wt Readings from Last 3 Encounters:  10/25/17 152 lb 9.6 oz (69.2 kg)  10/23/17 148 lb 8 oz (67.4 kg)  10/21/17 150 lb 2.1 oz (68.1 kg)   Temp Readings from Last 3 Encounters:  10/25/17 99.2 F (37.3 C) (Oral)  10/23/17 98.4 F (36.9 C) (Tympanic)  10/22/17 97.9 F (36.6 C) (Oral)   BP Readings from Last 3 Encounters:  10/25/17 (!) 130/56  10/23/17 123/85  10/22/17 104/70   Pulse Readings from Last 3 Encounters:  10/25/17 (!) 105  10/23/17 (!) 102  10/22/17 97    Physical Exam  Constitutional: He is oriented to person, place, and time. Vital signs are normal. He appears well-developed and well-nourished. He is cooperative.  HENT:  Head: Normocephalic and atraumatic.  Mouth/Throat: Oropharynx is clear and moist and mucous membranes are normal.  Eyes: Pupils are equal, round, and reactive to light. Conjunctivae are normal.  Cardiovascular: Regular rhythm and normal  heart sounds. Tachycardia present.  Neg leg edema b/l   Pulmonary/Chest: Effort normal and breath sounds normal.  Abdominal: Soft. Bowel sounds are normal. He exhibits distension and fluid wave. There is no tenderness.    Neurological: He is alert and oriented to person, place, and time. Gait normal.  Skin: Skin is warm, dry and intact.  Psychiatric: He has a normal mood and affect. His speech is normal and behavior is normal. Judgment and thought content normal. Cognition and memory are normal.  Nursing note and vitals reviewed.   Assessment   1. Liver cirrhosis with recurrent ascites s/p paracentesis 10/21/17 5L removed with chronic hyponatremia 2. Hypothyroidism  3. Hydrocele scrotal likely related to #1  4. DM 2 A1C 8.9 09/23/17  5. HM Plan   1.  Will CC renal and GI brother Dr. Edwyna Shell (cardiologist in [place]) would like call in c/w patient care [phone] Will ask if holding lasix could resolve temp hyponatremia to get to 135 Na or if renal and GI think cirrhosis would still cause pt to have hyponatremia  TIPS pending Na level being 135 or higher as pt has high MELD score 25  I personally spoke with brother Rodney Stewart today and answered questions re TIPS according to radiology last note Dr. Reesa Chew  2. Increase levo 25 to 50  3. Refer [facility] urology 2nd opinion appt sch 11/11 will call if sooner appt  4.  Should be on levemir 37 units appears hosp reduced to 30 units, on SSI Novolog  Before meals 131-180 2 units, 181-240 4 units, 241-300 6 units, 301-350 8 units, 351-400 10 units, >400 12 units         5.  Had flu shot  Tdaputd  pna 23 01/28/15 and prevnar 11/30/13  Disc shingrix in future   TSH need to monitor will not treat for nowconsider thyroid US Former smoker PSA neg5/8/19 Colonoscopy get records [facility] in [place] release signed today   Provider: Dr. Olivia Mackie [name]-Internal Medicine

## 2017-10-25 NOTE — Telephone Encounter (Signed)
EMMI Follow-up: Noted on the report that the patient had questions about his discharge papers.  I talked with Rodney Stewart and he was getting ready to leave for his follow-up appointment and had no questions for me today.  I let him know there would be a second automated call with a different series of questions and to let us know at that time if he had any concerns.  He thanked me for calling.

## 2017-10-28 ENCOUNTER — Encounter: Payer: Self-pay | Admitting: Internal Medicine

## 2017-10-28 ENCOUNTER — Other Ambulatory Visit: Payer: Self-pay | Admitting: Internal Medicine

## 2017-10-28 DIAGNOSIS — K1230 Oral mucositis (ulcerative), unspecified: Secondary | ICD-10-CM

## 2017-10-28 NOTE — Telephone Encounter (Signed)
Copied from [place] [phone]. Topic: Quick Communication - Rx Refill/Question >> Oct 28, 2017  4:17 PM Sheran Luz wrote: Medication: magic mouthwash w/lidocaine SOLN   Pt is requesting a refill of this medication, stating he is still experiencing mouth pain.   Has the patient contacted their pharmacy? Yes-Pharmacy advised pt to contact office.  Preferred Pharmacy (with phone number or street name): [facility] [place] [phone] (Phone) [phone] (Fax)

## 2017-10-29 ENCOUNTER — Other Ambulatory Visit: Payer: Self-pay | Admitting: *Deleted

## 2017-10-29 DIAGNOSIS — R600 Localized edema: Secondary | ICD-10-CM

## 2017-10-29 NOTE — Telephone Encounter (Signed)
Please advise if we should refill at this time.

## 2017-10-29 NOTE — Telephone Encounter (Signed)
Requested medication (s) are due for refill today: yes  Requested medication (s) are on the active medication list: yes  Last refill:  09/23/17  236ml  Future visit scheduled: no  Notes to clinic:  LOV: 10/25/17    Requested Prescriptions  Pending Prescriptions Disp Refills   magic mouthwash w/lidocaine SOLN 250 mL 0    Sig: Take 5 mLs by mouth 3 (three) times daily as needed for mouth pain. Swish and spit     Off-Protocol Failed - 10/28/2017  5:10 PM      Failed - Medication not assigned to a protocol, review manually.      Passed - Valid encounter within last 12 months    Recent Outpatient Visits          4 days ago Cirrhosis of liver with ascites, unspecified hepatic cirrhosis type [facility])   [place] [name], [name], MD   3 weeks ago Ascites due to alcoholic hepatitis   [place] [name], [name], MD   1 month ago Cirrhosis of liver with ascites, unspecified hepatic cirrhosis type [facility])   [place] [name], [name], MD   5 months ago Bronchospasm   [place], [name], MD   6 months ago Type 2 diabetes mellitus with diabetic polyneuropathy, with long-term current use of insulin ([place])   [place], [name], MD      Future Appointments            In 1 week O'Brien-Blaney, Bryson Corona, LPN [place], [place]   In 1 month Jonathon Bellows, MD [place]   In 1 month [name], [name], MD [facility], [facility]

## 2017-10-29 NOTE — Telephone Encounter (Signed)
Patient cad asking if he is to continue taking lasix 80 mg daily, if so , he needs a refill

## 2017-10-29 NOTE — Telephone Encounter (Signed)
[place] patient.

## 2017-10-29 NOTE — Telephone Encounter (Signed)
I think we should defer to either GI or nephology.  They are managing his fluid issues related to his cirrhosis.

## 2017-10-30 ENCOUNTER — Telehealth: Payer: Self-pay | Admitting: Internal Medicine

## 2017-10-30 ENCOUNTER — Other Ambulatory Visit: Payer: Self-pay | Admitting: Internal Medicine

## 2017-10-30 DIAGNOSIS — E8809 Other disorders of plasma-protein metabolism, not elsewhere classified: Secondary | ICD-10-CM

## 2017-10-30 DIAGNOSIS — K7460 Unspecified cirrhosis of liver: Secondary | ICD-10-CM

## 2017-10-30 DIAGNOSIS — R188 Other ascites: Principal | ICD-10-CM

## 2017-10-30 DIAGNOSIS — Z794 Long term (current) use of insulin: Secondary | ICD-10-CM

## 2017-10-30 DIAGNOSIS — E871 Hypo-osmolality and hyponatremia: Secondary | ICD-10-CM

## 2017-10-30 DIAGNOSIS — C7802 Secondary malignant neoplasm of left lung: Secondary | ICD-10-CM

## 2017-10-30 DIAGNOSIS — R600 Localized edema: Secondary | ICD-10-CM

## 2017-10-30 DIAGNOSIS — E1140 Type 2 diabetes mellitus with diabetic neuropathy, unspecified: Secondary | ICD-10-CM

## 2017-10-30 DIAGNOSIS — K760 Fatty (change of) liver, not elsewhere classified: Secondary | ICD-10-CM

## 2017-10-30 DIAGNOSIS — C649 Malignant neoplasm of unspecified kidney, except renal pelvis: Secondary | ICD-10-CM

## 2017-10-30 MED ORDER — MAGIC MOUTHWASH W/LIDOCAINE: 5.0000 mL | Freq: Three times a day (TID) | ORAL | 0 refills | Status: AC | PRN

## 2017-10-30 NOTE — Telephone Encounter (Signed)
-----   Message from Delorise Jackson, MD sent at 10/28/2017  4:18 PM EDT ----- pts brother wanted a cal please and see my A/P please reply to my ? About Lasix and Na?   [place] fax note to [employer] in [place] pt has appt this week unable to see with whom appt with to see above and answer ? About lasix and contact brother please phone # in note.

## 2017-10-30 NOTE — Telephone Encounter (Signed)
Please refer to one of these agencies for PT and nursing    Thanks [place]

## 2017-10-30 NOTE — Telephone Encounter (Signed)
Copied from [place] [phone]. Topic: Quick Communication - See Telephone Encounter >> Oct 30, 2017  1:29 PM Vernona Rieger wrote: CRM for notification. See Telephone encounter for: 10/30/17.  Judson Roch, RN with [facility] stated that the patient contacted her to let her know that a home health agency come out to see him and told him that they advised him that there are not in his network. He is unsure of which agency it was. She wants Dr Aundra Dubin to know this and the ones that are in network with [employer] are [place] [phone], [place] [phone], or [place] [phone]. Judson Roch can be reached at [phone] if needed.

## 2017-10-30 NOTE — Telephone Encounter (Signed)
Left message for patient to return call back. [facility] may give and obtain information.  

## 2017-10-30 NOTE — Telephone Encounter (Signed)
Make sure note faxed to [facility]  appt this week   [place]

## 2017-10-31 NOTE — Telephone Encounter (Signed)
Sent referral to  [facility]

## 2017-10-31 NOTE — Telephone Encounter (Signed)
Note has been faxed.

## 2017-11-03 NOTE — Progress Notes (Signed)
[place]  [phone] [phone]  ID: Rodney Stewart OB: [birth year]  MR#: [id number]  QPY#:[id number]  Patient Care Team: [name], [name], MD as PCP - General (Internal Medicine)  CHIEF COMPLAINT: Stage IV renal cell carcinoma with bilateral lung metastases.  INTERVAL HISTORY: Patient returns to clinic today for further evaluation and continuation of nivolumab.  He is accompanied by his daughter today.  He was recently admitted to the hospital for his decompensated cirrhosis and had 5 L of ascitic fluid removed by paracentesis.  He continues to have a decreased performance status.  He does not complain of pain today.  He continues to have significant peripheral neuropathy in both feet secondary to diabetes, but has no other neurologic complaints. He denies any fevers, chills, or recent illnesses. He has a fair appetite.  He denies any chest pain, shortness of breath, cough, or hemoptysis. He has no nausea, vomiting, constipation, or diarrhea. He has no melena or hematochezia. He has no urinary complaints.  Patient offers no further specific complaints today.  REVIEW OF SYSTEMS:   Review of Systems  Constitutional: Negative.  Negative for fever, malaise/fatigue and weight loss.  HENT: Negative.  Negative for congestion.   Eyes: Negative for blurred vision.  Respiratory: Negative.  Negative for cough and shortness of breath.   Cardiovascular: Negative.  Negative for chest pain and leg swelling.  Gastrointestinal: Negative.  Negative for abdominal pain, constipation, diarrhea, nausea and vomiting.  Genitourinary: Negative.  Negative for dysuria.  Musculoskeletal: Positive for joint pain. Negative for back pain.  Skin: Negative.  Negative for rash.  Neurological: Positive for tingling and sensory change. Negative for focal weakness, weakness and headaches.  Psychiatric/Behavioral: Negative.  The patient is not nervous/anxious and does not have  insomnia.    As per HPI. Otherwise, a complete review of systems is negative.   PAST MEDICAL HISTORY: Past Medical History:  Diagnosis Date  . Anemia   . Ascites   . BPH (benign prostatic hyperplasia)   . Cirrhosis ([place])   . CKD (chronic kidney disease) stage 3, GFR 30-59 ml/min (HCC)   . Clear cell renal cell carcinoma ([place]) 2014   Left Nephrectomy.  . Colon polyps   . Diabetes mellitus without complication ([place])    type 2   . Dyspnea    with exertion  . GERD (gastroesophageal reflux disease)   . History of gout   . History of nephrectomy    Left  . Hypertension   . Neuropathy   . Renal Cancer    Renal Cancer  . Renal cell carcinoma of left kidney (HCC)    mets to lungs  . Umbilical hernia     PAST SURGICAL HISTORY: Past Surgical History:  Procedure Laterality Date  . BACK SURGERY     ruptured disc 1991   . ENDOBRONCHIAL ULTRASOUND N/A 10/14/2015   Procedure: ENDOBRONCHIAL ULTRASOUND;  Surgeon: [name], MD;  Location: [facility] ORS;  Service: Pulmonary;  Laterality: N/A;  . ESOPHAGOGASTRODUODENOSCOPY (EGD) WITH PROPOFOL N/A 04/15/2017   Procedure: ESOPHAGOGASTRODUODENOSCOPY (EGD) WITH PROPOFOL;  Surgeon: Jonathon Bellows, MD;  Location: [facility] ENDOSCOPY;  Service: Gastroenterology;  Laterality: N/A;  Screen for esophageal varices  . EYE SURGERY Bilateral    Cataract Extraction with IOL  . IR RADIOLOGIST EVAL & MGMT  10/15/2017  . KNEE SURGERY Right   . NEPHRECTOMY     left kidney 2014 renal cell cancer     FAMILY HISTORY: Family History  Problem Relation Age of  Onset  . Ovarian cancer Mother   . Diabetes Father   . Hypertension Father     ADVANCED DIRECTIVES (Y/N):  N  HEALTH MAINTENANCE: Social History   Tobacco Use  . Smoking status: Former Smoker    Packs/day: 1.00    Types: Cigarettes    Last attempt to quit: 04/15/1993    Years since quitting: 24.5  . Smokeless tobacco: Never Used  Substance Use Topics  . Alcohol use: Not Currently  . Drug  use: No     Colonoscopy:  PAP:  Bone density:  Lipid panel:  No Known Allergies  Current Outpatient Medications  Medication Sig Dispense Refill  . allopurinol (ZYLOPRIM) 100 MG tablet Take 1 tablet (100 mg total) by mouth daily. 90 tablet 3  . ALPRAZolam (XANAX) 1 MG tablet Take 1 mg by mouth 2 (two) times daily as needed for anxiety or sleep.     Mar[place] Kitchen aspirin EC 81 MG tablet Take 81 mg by mouth daily.    [place] doxycycline (VIBRA-TABS) 100 MG tablet Take 1 tablet (100 mg total) by mouth 2 (two) times daily. With food 20 tablet 0  . feeding supplement, ENSURE ENLIVE, (ENSURE ENLIVE) LIQD Take 237 mLs by mouth 3 (three) times daily between meals. 237 mL 12  . furosemide (LASIX) 20 MG tablet Take 2 tablets (40 mg total) by mouth daily. In am 30 tablet 0  . gabapentin (NEURONTIN) 100 MG capsule Take 2 capsules (200 mg total) by mouth 3 (three) times daily. (Patient taking differently: Take 200 mg by mouth 2 (two) times daily. ) 360 capsule 5  . insulin aspart (NOVOLOG) 100 UNIT/ML FlexPen Before meals 131-180 2 units, 181-240 4 units, 241-300 6 units, 301-350 8 units, 351-400 10 units, >400 12 units 15 mL 11  . Insulin Detemir (LEVEMIR FLEXTOUCH) 100 UNIT/ML Pen Inject 18 Units into the skin 2 (two) times daily. With food 12 pen 12  . Insulin Pen Needle 32G X 4 MM MISC 1 Device by Does not apply route 2 (two) times daily. E11.9 200 each 12  . lactulose (CHRONULAC) 10 GM/15ML solution Take 45 mLs (30 g total) by mouth daily. 1800 mL 2  . levothyroxine (SYNTHROID, LEVOTHROID) 50 MCG tablet Take 1 tablet (50 mcg total) by mouth daily before breakfast. 30 minutes 90 tablet 0  . lovastatin (MEVACOR) 20 MG tablet Take 1 tablet (20 mg total) by mouth daily at 6 PM. 90 tablet 3  . magic mouthwash w/lidocaine SOLN Take 5 mLs by mouth 3 (three) times daily as needed for mouth pain. Swish and spit 250 mL 0  . mupirocin ointment (BACTROBAN) 2 % Apply 1 application topically 3 (three) times daily. Left big toe and  right back 30 g 1  . ONE TOUCH ULTRA TEST test strip Bid 180 each 3  . oxyCODONE (ROXICODONE) 5 MG immediate release tablet Take 1 tablet (5 mg total) by mouth every 6 (six) hours as needed for severe pain or breakthrough pain. 45 tablet 0  . tamsulosin (FLOMAX) 0.4 MG CAPS capsule Take 1 capsule (0.4 mg total) by mouth every other day. With supper 90 capsule 3  . valACYclovir (VALTREX) 1000 MG tablet Take 1 tablet (1,000 mg total) by mouth 2 (two) times daily. X 5-10 days with food 30 tablet 5  . potassium chloride SA (K-DUR,KLOR-CON) 20 MEQ tablet Take 1 tablet (20 mEq total) by mouth daily for 3 days. 3 tablet 0   No current facility-administered medications for this visit.  OBJECTIVE: Vitals:   11/06/17 1044  BP: 127/79  Pulse: (!) 104  Resp: 18  Temp: 98 F (36.7 C)     Body mass index is 21.9 kg/m.    ECOG FS:0 - Asymptomatic  General: Ill-appearing, no acute distress. Eyes: Pink conjunctiva, anicteric sclera. HEENT: Normocephalic, moist mucous membranes, clear oropharnyx. Lungs: Clear to auscultation bilaterally. Heart: Regular rate and rhythm. No rubs, murmurs, or gallops. Abdomen: Mildly distended. Musculoskeletal: No edema, cyanosis, or clubbing. Neuro: Alert, answering all questions appropriately. Cranial nerves grossly intact. Skin: No rashes or petechiae noted. Psych: Normal affect.  LAB RESULTS:  Lab Results  Component Value Date   NA 126 (L) 11/06/2017   K 3.5 11/06/2017   CL 90 (L) 11/06/2017   CO2 28 11/06/2017   GLUCOSE 105 (H) 11/06/2017   BUN 50 (H) 11/06/2017   CREATININE 1.83 (H) 11/06/2017   CALCIUM 8.7 (L) 11/06/2017   PROT 7.1 11/06/2017   ALBUMIN 2.7 (L) 11/06/2017   AST 30 11/06/2017   ALT 16 11/06/2017   ALKPHOS 252 (H) 11/06/2017   BILITOT 2.5 (H) 11/06/2017   GFRNONAA 38 (L) 11/06/2017   GFRAA 44 (L) 11/06/2017    Lab Results  Component Value Date   WBC 3.0 (L) 11/06/2017   NEUTROABS 2.2 11/06/2017   HGB 8.0 (L) 11/06/2017    HCT 24.7 (L) 11/06/2017   MCV 87.0 11/06/2017   PLT 140 (L) 11/06/2017   Lab Results  Component Value Date   IRON 55 09/15/2017   TIBC 243 (L) 09/15/2017   IRONPCTSAT 23 09/15/2017   Lab Results  Component Value Date   FERRITIN 54 09/15/2017     STUDIES: Dg Chest 2 View  Result Date: 10/18/2017 CLINICAL DATA:  Shortness of breath over the past 6 months, worse over the past 2 days. History of metastatic renal cell carcinoma EXAM: CHEST - 2 VIEW COMPARISON:  CT chest dated August 30, 2017. Chest x-ray dated January 25, 2017. FINDINGS: Normal heart size. Fullness of both hilar regions is unchanged. Small bilateral pleural effusions. No consolidation or pneumothorax. No acute osseous abnormality. IMPRESSION: 1. New small bilateral pleural effusions. 2. Unchanged bilateral hilar fullness corresponding to known hilar lymphadenopathy. Electronically Signed   By: Titus Dubin M.D.   On: 10/18/2017 15:24   US Paracentesis  Result Date: 10/21/2017 INDICATION: Recurrent symptomatic intra-abdominal ascites. Please perform ultrasound-guided paracentesis for therapeutic purposes as indicated. Paracentesis is limited at 5 L. EXAM: ULTRASOUND-GUIDED PARACENTESIS COMPARISON:  CT abdomen pelvis-09/20/2017; multiple previous ultrasound-guided paracenteses, most recently 09/16/2017 yielding 2 L of ascitic fluid. MEDICATIONS: None. COMPLICATIONS: None immediate. TECHNIQUE: Informed written consent was obtained from the patient after a discussion of the risks, benefits and alternatives to treatment. A timeout was performed prior to the initiation of the procedure. Initial ultrasound scanning demonstrates a large amount of ascites within the right lower abdominal quadrant. The right lower abdomen was prepped and draped in the usual sterile fashion. 1% lidocaine with epinephrine was used for local anesthesia. An ultrasound image was saved for documentation purposed. An 8 Fr Safe-T-Centesis catheter was  introduced. The paracentesis was performed. The catheter was removed and a dressing was applied. The patient tolerated the procedure well without immediate post procedural complication. FINDINGS: A total of approximately 5 liters of serous fluid was removed. IMPRESSION: Successful ultrasound-guided paracentesis yielding 5 liters of peritoneal fluid. Electronically Signed   By: Sandi Mariscal M.D.   On: 10/21/2017 10:50   Ir Radiologist Eval & Mgmt  Result Date: 10/15/2017  Please refer to notes tab for details about interventional procedure. (Op Note)    Oncology history: Patient underwent left nephrectomy on May 16, 2012 which revealed a clear cell grade 2 renal cell carcinoma, stage TIIIa, N0, M0. Tumor size of 16 cm. Patient was noted to have renal vein involvement, but no other structures were involved. 0 of 2 lymph nodes were negative for disease. PET scan on October 20, 2015 revealed metastatic disease and patient was initiated on Votrient. This was subsequently discontinued in March 2018 secondary to progression of disease. Patient initiated second line treatment with nivolumab on Monday, April 02, 2016  ASSESSMENT: Stage IV left renal cell carcinoma with metastasis to the lungs  PLAN:    1. Stage IV left renal cell carcinoma with metastasis to the lungs: CT scan results from August 30, 2017 reviewed independently with stable mediastinal/hilar adenopathy as well as pulmonary metastatic disease.  Patient continues to have large ascites.  Proceed with patient's next infusion of nivolumab today.  He will receive a flat dose of 240 mg every 2 weeks until intolerable side effects or significant progression of disease.  Return to clinic in 2 weeks for nivolumab only and then in 4 weeks for further evaluation and continuation of treatment.  Will reimage prior to appointment in 4 weeks.  Patient was also given a referral to palliative care.  Appreciate their input. 2.  Anemia: Patient's hemoglobin is  trending down and is now 8.0.  Monitor. 3.  Pancytopenia: Chronic and unchanged.  Secondary to cirrhosis. 4.  Renal insufficiency: Patient's creatinine slowly trending up and is now 1.83.  Patient has nephrology consult in the near future.   5. Peripheral neuropathy: Chronic and unchanged.  Likely secondary to diabetes, continue current dose of gabapentin.  Continue evaluation and treatment per primary care. 6.  Cirrhosis/ascites: Appreciate GI input.  Patient continues to require periodic paracentesis and is being evaluated for TIPS procedure.   7. Hyperbilirubinemia: Chronic and relatively unchanged.  Secondary to cirrhosis.  Patient's bilirubin is now 2.5.   8.  Hyponatremia: Sodium 126 today.  Continue treatment and management per GI. 9.  Hyperkalemia: Resolved.   Patient expressed understanding and was in agreement with this plan. He also understands that He can call clinic at any time with any questions, concerns, or complaints.   Cancer Staging Metastatic renal cell carcinoma to lung [facility]) Staging form: Kidney, AJCC 7th Edition - Clinical stage from 10/12/2015: Stage IV ([place], N0, M1) - Signed by Lloyd Huger, MD on 10/12/2015   Lloyd Huger, MD 11/06/17 1:21 PM

## 2017-11-03 NOTE — Progress Notes (Signed)
I spoke with the brother and explained he has decompensated cirrhosis and metastatic cancer. Explainced TIPS can be considered to help with ascites once sodium is higher. He is following with nephrology . From my point of view have no further definite therapy to offer. Have explained at follow up , happy to keep a watch on the electrolytes and refer back when ready

## 2017-11-04 ENCOUNTER — Telehealth: Payer: Self-pay | Admitting: Internal Medicine

## 2017-11-04 DIAGNOSIS — K7011 Alcoholic hepatitis with ascites: Secondary | ICD-10-CM | POA: Diagnosis not present

## 2017-11-04 DIAGNOSIS — I129 Hypertensive chronic kidney disease with stage 1 through stage 4 chronic kidney disease, or unspecified chronic kidney disease: Secondary | ICD-10-CM | POA: Diagnosis not present

## 2017-11-04 DIAGNOSIS — N183 Chronic kidney disease, stage 3 (moderate): Secondary | ICD-10-CM | POA: Diagnosis not present

## 2017-11-04 DIAGNOSIS — E1122 Type 2 diabetes mellitus with diabetic chronic kidney disease: Secondary | ICD-10-CM | POA: Diagnosis not present

## 2017-11-04 DIAGNOSIS — R69 Illness, unspecified: Secondary | ICD-10-CM | POA: Diagnosis not present

## 2017-11-04 NOTE — Telephone Encounter (Signed)
Copied from [place] [id number]. Topic: Quick Communication - Home Health Verbal Orders >> Nov 04, 2017  4:41 PM Blase Mess A wrote: Caller/Agency: [place] Number: [phone] [facility] to ok on voicemail Requesting OT/PT/Skilled Nursing/Social Work: Requesting nursing order for disease management and medication teaching Frequency: 2 week 2 1 week 2

## 2017-11-04 NOTE — Telephone Encounter (Signed)
Call back to approve  Thanks [place]

## 2017-11-05 ENCOUNTER — Ambulatory Visit (INDEPENDENT_AMBULATORY_CARE_PROVIDER_SITE_OTHER): Payer: Medicare HMO | Admitting: Internal Medicine

## 2017-11-05 ENCOUNTER — Ambulatory Visit (INDEPENDENT_AMBULATORY_CARE_PROVIDER_SITE_OTHER): Payer: Medicare HMO

## 2017-11-05 ENCOUNTER — Encounter: Payer: Self-pay | Admitting: Internal Medicine

## 2017-11-05 ENCOUNTER — Other Ambulatory Visit: Payer: Self-pay | Admitting: *Deleted

## 2017-11-05 VITALS — BP 130/70 | HR 104 | Temp 98.4°F | Resp 16 | Ht 71.0 in | Wt 157.0 lb

## 2017-11-05 DIAGNOSIS — E039 Hypothyroidism, unspecified: Secondary | ICD-10-CM | POA: Diagnosis not present

## 2017-11-05 DIAGNOSIS — E1140 Type 2 diabetes mellitus with diabetic neuropathy, unspecified: Secondary | ICD-10-CM

## 2017-11-05 DIAGNOSIS — S20411A Abrasion of right back wall of thorax, initial encounter: Secondary | ICD-10-CM | POA: Diagnosis not present

## 2017-11-05 DIAGNOSIS — R739 Hyperglycemia, unspecified: Secondary | ICD-10-CM

## 2017-11-05 DIAGNOSIS — S91102A Unspecified open wound of left great toe without damage to nail, initial encounter: Secondary | ICD-10-CM | POA: Diagnosis not present

## 2017-11-05 DIAGNOSIS — R4182 Altered mental status, unspecified: Secondary | ICD-10-CM | POA: Diagnosis not present

## 2017-11-05 DIAGNOSIS — E1142 Type 2 diabetes mellitus with diabetic polyneuropathy: Secondary | ICD-10-CM | POA: Diagnosis not present

## 2017-11-05 DIAGNOSIS — Z794 Long term (current) use of insulin: Secondary | ICD-10-CM

## 2017-11-05 DIAGNOSIS — R410 Disorientation, unspecified: Secondary | ICD-10-CM | POA: Diagnosis not present

## 2017-11-05 DIAGNOSIS — Z0000 Encounter for general adult medical examination without abnormal findings: Secondary | ICD-10-CM | POA: Diagnosis not present

## 2017-11-05 DIAGNOSIS — C649 Malignant neoplasm of unspecified kidney, except renal pelvis: Secondary | ICD-10-CM

## 2017-11-05 DIAGNOSIS — R188 Other ascites: Secondary | ICD-10-CM

## 2017-11-05 DIAGNOSIS — L03032 Cellulitis of left toe: Secondary | ICD-10-CM | POA: Diagnosis not present

## 2017-11-05 DIAGNOSIS — E871 Hypo-osmolality and hyponatremia: Secondary | ICD-10-CM

## 2017-11-05 DIAGNOSIS — C7802 Secondary malignant neoplasm of left lung: Secondary | ICD-10-CM

## 2017-11-05 DIAGNOSIS — K7460 Unspecified cirrhosis of liver: Secondary | ICD-10-CM | POA: Diagnosis not present

## 2017-11-05 DIAGNOSIS — E441 Mild protein-calorie malnutrition: Secondary | ICD-10-CM

## 2017-11-05 DIAGNOSIS — C7800 Secondary malignant neoplasm of unspecified lung: Principal | ICD-10-CM

## 2017-11-05 LAB — COMPREHENSIVE METABOLIC PANEL
ALK PHOS: 267 U/L — AB (ref 39–117)
ALT: 14 U/L (ref 0–53)
AST: 29 U/L (ref 0–37)
Albumin: 2.9 g/dL — ABNORMAL LOW (ref 3.5–5.2)
BILIRUBIN TOTAL: 2.8 mg/dL — AB (ref 0.2–1.2)
BUN: 51 mg/dL — AB (ref 6–23)
CO2: 27 mEq/L (ref 19–32)
CREATININE: 1.97 mg/dL — AB (ref 0.40–1.50)
Calcium: 8.4 mg/dL (ref 8.4–10.5)
Chloride: 88 mEq/L — ABNORMAL LOW (ref 96–112)
GFR: 36.88 mL/min — ABNORMAL LOW (ref >60.00)
Glucose, Bld: 399 mg/dL — ABNORMAL HIGH (ref 70–99)
Potassium: 4.1 mEq/L (ref 3.5–5.1)
Sodium: 125 mEq/L — ABNORMAL LOW (ref 135–145)
TOTAL PROTEIN: 7.3 g/dL (ref 6.0–8.3)

## 2017-11-05 LAB — TSH: TSH: 7.02 u[IU]/mL — ABNORMAL HIGH (ref 0.35–4.50)

## 2017-11-05 LAB — AMMONIA: AMMONIA: 46 umol/L — AB (ref 11–35)

## 2017-11-05 MED ORDER — MUPIROCIN 2 % EX OINT: 1.0000 "application " | TOPICAL_OINTMENT | Freq: Three times a day (TID) | CUTANEOUS | 1 refills | Status: AC

## 2017-11-05 MED ORDER — INSULIN DETEMIR 100 UNIT/ML FLEXPEN: 18.0000 [IU] | PEN_INJECTOR | Freq: Two times a day (BID) | SUBCUTANEOUS | 12 refills | Status: AC

## 2017-11-05 MED ORDER — DOXYCYCLINE HYCLATE 100 MG PO TABS: 100.0000 mg | ORAL_TABLET | Freq: Two times a day (BID) | ORAL | 0 refills | Status: DC

## 2017-11-05 NOTE — Progress Notes (Signed)
Subjective:   Rodney Stewart is a 62 y.o. male who presents for an Initial Medicare Annual Wellness Visit.  Review of Systems  No ROS.  Medicare Wellness Visit. Additional risk factors are reflected in the social history. Cardiac Risk Factors include: advanced age (>37men, >42 women);male gender;hypertension;diabetes mellitus    Objective:    Today's Vitals   11/05/17 1328  BP: 130/70  Pulse: (!) 104  Resp: 16  Temp: 98.4 F (36.9 C)  TempSrc: Oral  SpO2: 98%  Weight: 157 lb (71.2 kg)  Height: 5\' 11"  (1.803 m)  PainSc: 5    Body mass index is 21.9 kg/m.  Advanced Directives 10/18/2017 10/18/2017 10/09/2017 09/25/2017 09/20/2017 09/14/2017 09/14/2017  Does Patient Have a Medical Advance Directive? No No No No No No No  Would patient like information on creating a medical advance directive? No - Patient declined No - Patient declined - No - Patient declined - Yes (Inpatient - patient defers creating a medical advance directive at this time) Yes (Inpatient - patient requests chaplain consult to create a medical advance directive)    Current Medications (verified) Outpatient Encounter Medications as of 11/05/2017  Medication Sig  . allopurinol (ZYLOPRIM) 100 MG tablet Take 1 tablet (100 mg total) by mouth daily.  Marland Kitchen ALPRAZolam (XANAX) 1 MG tablet Take 1 mg by mouth 2 (two) times daily as needed for anxiety or sleep.   Marland Kitchen aspirin EC 81 MG tablet Take 81 mg by mouth daily.  . feeding supplement, ENSURE ENLIVE, (ENSURE ENLIVE) LIQD Take 237 mLs by mouth 3 (three) times daily between meals.  . furosemide (LASIX) 20 MG tablet Take 2 tablets (40 mg total) by mouth daily. In am  . gabapentin (NEURONTIN) 100 MG capsule Take 2 capsules (200 mg total) by mouth 3 (three) times daily. (Patient taking differently: Take 200 mg by mouth 2 (two) times daily. )  . insulin aspart (NOVOLOG) 100 UNIT/ML FlexPen Before meals 131-180 2 units, 181-240 4 units, 241-300 6 units, 301-350 8 units, 351-400 10  units, >400 12 units  . Insulin Detemir (LEVEMIR FLEXTOUCH) 100 UNIT/ML Pen Inject 30 Units into the skin daily at 10 pm.  . Insulin Pen Needle 32G X 4 MM MISC 1 Device by Does not apply route 2 (two) times daily. E11.9  . lactulose (CHRONULAC) 10 GM/15ML solution Take 45 mLs (30 g total) by mouth daily.  Marland Kitchen levothyroxine (SYNTHROID, LEVOTHROID) 50 MCG tablet Take 1 tablet (50 mcg total) by mouth daily before breakfast. 30 minutes  . lovastatin (MEVACOR) 20 MG tablet Take 1 tablet (20 mg total) by mouth daily at 6 PM.  . magic mouthwash w/lidocaine SOLN Take 5 mLs by mouth 3 (three) times daily as needed for mouth pain. Swish and spit  . ONE TOUCH ULTRA TEST test strip Bid  . oxyCODONE (ROXICODONE) 5 MG immediate release tablet Take 1 tablet (5 mg total) by mouth every 6 (six) hours as needed for severe pain or breakthrough pain.  . tamsulosin (FLOMAX) 0.4 MG CAPS capsule Take 1 capsule (0.4 mg total) by mouth every other day. With supper  . valACYclovir (VALTREX) 1000 MG tablet Take 1 tablet (1,000 mg total) by mouth 2 (two) times daily. X 5-10 days with food  . potassium chloride SA (K-DUR,KLOR-CON) 20 MEQ tablet Take 1 tablet (20 mEq total) by mouth daily for 3 days.   No facility-administered encounter medications on file as of 11/05/2017.     Allergies (verified) Patient has no known allergies.  History: Past Medical History:  Diagnosis Date  . Anemia   . Ascites   . BPH (benign prostatic hyperplasia)   . Cirrhosis ([place])   . CKD (chronic kidney disease) stage 3, GFR 30-59 ml/min (HCC)   . Clear cell renal cell carcinoma ([place]) 2014   Left Nephrectomy.  . Colon polyps   . Diabetes mellitus without complication ([place])    type 2   . Dyspnea    with exertion  . GERD (gastroesophageal reflux disease)   . History of gout   . History of nephrectomy    Left  . Hypertension   . Neuropathy   . Renal Cancer    Renal Cancer  . Renal cell carcinoma of left kidney (HCC)    mets to  lungs  . Umbilical hernia    Past Surgical History:  Procedure Laterality Date  . BACK SURGERY     ruptured disc 1991   . ENDOBRONCHIAL ULTRASOUND N/A 10/14/2015   Procedure: ENDOBRONCHIAL ULTRASOUND;  Surgeon: [name], MD;  Location: [facility] ORS;  Service: Pulmonary;  Laterality: N/A;  . ESOPHAGOGASTRODUODENOSCOPY (EGD) WITH PROPOFOL N/A 04/15/2017   Procedure: ESOPHAGOGASTRODUODENOSCOPY (EGD) WITH PROPOFOL;  Surgeon: Jonathon Bellows, MD;  Location: [facility] ENDOSCOPY;  Service: Gastroenterology;  Laterality: N/A;  Screen for esophageal varices  . EYE SURGERY Bilateral    Cataract Extraction with IOL  . IR RADIOLOGIST EVAL & MGMT  10/15/2017  . KNEE SURGERY Right   . NEPHRECTOMY     left kidney 2014 renal cell cancer    Family History  Problem Relation Age of Onset  . Ovarian cancer Mother   . Diabetes Father   . Hypertension Father    Social History   Socioeconomic History  . Marital status: Divorced    Spouse name: Not on file  . Number of children: 2  . Years of education: Not on file  . Highest education level: Not on file  Occupational History  . Not on file  Social Needs  . Financial resource strain: Not hard at all  . Food insecurity:    Worry: Never true    Inability: Never true  . Transportation needs:    Medical: No    Non-medical: No  Tobacco Use  . Smoking status: Former Smoker    Packs/day: 1.00    Types: Cigarettes    Last attempt to quit: 04/15/1993    Years since quitting: 24.5  . Smokeless tobacco: Never Used  Substance and Sexual Activity  . Alcohol use: Not Currently  . Drug use: No  . Sexual activity: Yes  Lifestyle  . Physical activity:    Days per week: Patient refused    Minutes per session: Patient refused  . Stress: Patient refused  Relationships  . Social connections:    Talks on phone: Patient refused    Gets together: Patient refused    Attends religious service: Patient refused    Active member of club or organization:  Patient refused    Attends meetings of clubs or organizations: Patient refused    Relationship status: Patient refused  Other Topics Concern  . Not on file  Social History Narrative   HS graduate    Retired    Divorced    2 daughters    Lives alone    [name] to travel to [employer]    1 living brother who is doctor cardiologist in [place] [phone] emergency contact    Has guns, wears seat belt    Tobacco  Counseling Counseling given: Not Answered   Clinical Intake:  Pre-visit preparation completed: Yes  Pain : No/denies pain Pain Score: 5  Pain Type: Chronic pain Pain Location: Knee Pain Orientation: Right, Left     Nutritional Status: BMI of 19-24  Normal Diabetes: Yes(Followed by pcp)  How often do you need to have someone help you when you read instructions, pamphlets, or other written materials from your doctor or pharmacy?: 3 - Sometimes  Interpreter Needed?: No     Activities of Daily Living In your present state of health, do you have any difficulty performing the following activities: 11/05/2017 10/18/2017  Hearing? N N  Vision? N N  Difficulty concentrating or making decisions? Y N  Comment Notes increased confusion at times. Memory delay. Deferred for follow up with pcp.  -  Walking or climbing stairs? Y N  Comment Unsteady gait -  Dressing or bathing? N N  Doing errands, shopping? N N  Preparing Food and eating ? Y -  Comment He does not cook. Family assists. Self feeds.  -  Using the Toilet? N -  In the past six months, have you accidently leaked urine? Y -  Do you have problems with loss of bowel control? N -  Managing your Medications? Y -  Comment Daughter assists as needed -  Managing your Finances? N -  Housekeeping or managing your Housekeeping? Y -  Comment Family assists as needed.  -  Some recent data might be hidden     Immunizations and Health Maintenance Immunization History  Administered Date(s) Administered  .  Hepatitis B, adult 08/02/2016  . Influenza Split 09/05/2015  . Influenza,inj,Quad PF,6+ Mos 08/18/2015, 10/10/2016, 09/16/2017  . Influenza,inj,Quad PF,6-35 Mos 10/02/2014  . Influenza-Unspecified 10/20/2013, 10/10/2016  . Pneumococcal Conjugate-13 11/30/2013  . Pneumococcal Polysaccharide-23 01/28/2015  . Tdap 09/23/2017   Health Maintenance Due  Topic Date Due  . FOOT EXAM  07/09/1966  . OPHTHALMOLOGY EXAM  07/09/1966  . COLONOSCOPY  07/09/2006  . URINE MICROALBUMIN  08/28/2017    Patient Care Team: [name], [name], MD as PCP - General (Internal Medicine)  Indicate any recent Medical Services you may have received from other than [facility] providers in the past year (date may be approximate).    Assessment:   This is a routine wellness examination for [place].  The goal of the wellness visit is to assist the patient how to close the gaps in care and create a preventative care plan for the patient.   The roster of all physicians providing medical care to patient is listed in the Snapshot section of the chart.  Presents with daughter, information received from both. Reports night sweats, increased confusion, fluctuating blood sugars, decrease in appetite, and blister on L foot, great toe.  Handicap placard form. Deferred to pcp for same day follow up.   Osteoporosis risk reviewed.    Safety issues reviewed; Smoke and carbon monoxide detectors in the home. Firearm safety discussed.Wears seatbelts when driving or riding with others. No violence in the home.  They do not have excessive sun exposure.  Discussed the need for sun protection: hats, long sleeves and the use of sunscreen if there is significant sun exposure.  Patient is alert, normal appearance, oriented to person/place/and time.  Correctly identified the president of the [place] and recalls of 3/3 words.Performs simple calculations and can read correct time from watch face.  Displays appropriate judgement.  No new  identified risk were noted.  No failures at ADL's  or IADL's.  Ambulates with cane when outside of the home.   BMI- discussed the importance of a healthy diet, water intake and the benefits of aerobic exercise. He plans to start physical therapy twice weekly, drink boost as a supplement with diet, increase water intake.   24 hour diet recall: Moderate diet, small portions.   Dental- UTD.   Eye- Visual acuity not assessed per patient preference since they have regular follow up with the ophthalmologist.  Wears corrective lenses.  Sleep patterns- Sleeps fair at night.  Night sweats evry so often. Deferred to pcp for follow up.   Colonoscopy discussed.  States he had the last 2 years ago in a hospital in [place], [place]. Unsure if records have been retrieved. Cologuard discussed.   Hearing/Vision screen Hearing Screening Comments: Patient is able to hear conversational tones without difficulty.  No issues reported.   Vision Screening Comments: Wears corrective lenses Annual visits Cataract extraction, bilateral Visual acuity not assessed per patient preference since they have regular follow up with the ophthalmologist  Dietary issues and exercise activities discussed: Current Exercise Habits: Home exercise routine, Type of exercise: strength training/weights(Arm exercises and physical therapy. ), Time (Minutes): 30, Frequency (Times/Week): 2, Weekly Exercise (Minutes/Week): 60, Intensity: Mild  Goals      Patient Stated   . Healthy Lifestyle (pt-stated)     Physical therapy exercises twice weekly Control blood sugars  Boost/Ensure supplement drink daily       Depression Screen PHQ 2/9 Scores 11/05/2017 09/23/2017 04/26/2017  PHQ - 2 Score 0 0 0    Fall Risk Fall Risk  11/05/2017 09/23/2017 06/26/2017 04/26/2017 12/12/2016  Falls in the past year? 0 Yes No No No  Comment None since last fall reported.  - - - -  Number falls in past yr: - 1 - - -   Cognitive Function:       6CIT Screen 11/05/2017  What Year? 0 points  What month? 0 points  What time? 0 points  Count back from 20 0 points  Months in reverse 0 points  Repeat phrase 0 points  Total Score 0    Screening Tests Health Maintenance  Topic Date Due  . FOOT EXAM  07/09/1966  . OPHTHALMOLOGY EXAM  07/09/1966  . COLONOSCOPY  07/09/2006  . URINE MICROALBUMIN  08/28/2017  . HEMOGLOBIN A1C  03/24/2018  . TETANUS/TDAP  09/24/2027  . INFLUENZA VACCINE  Completed  . PNEUMOCOCCAL POLYSACCHARIDE VACCINE AGE 61-64 HIGH RISK  Completed  . Hepatitis C Screening  Completed  . HIV Screening  Completed      Plan:    End of life planning; Advance aging; Advanced directives discussed. Copy of current HCPOA/Living Will requested.    I have personally reviewed and noted the following in the patient's chart:   . Medical and social history . Use of alcohol, tobacco or illicit drugs  . Current medications and supplements . Functional ability and status . Nutritional status . Physical activity . Advanced directives . List of other physicians . Hospitalizations, surgeries, and ER visits in previous 12 months . Vitals . Screenings to include cognitive, depression, and falls . Referrals and appointments  In addition, I have reviewed and discussed with patient certain preventive protocols, quality metrics, and best practice recommendations. A written personalized care plan for preventive services as well as general preventive health recommendations were provided to patient.     Varney Biles, LPN   [phone]

## 2017-11-05 NOTE — Progress Notes (Signed)
Agree with note seen today by me  Jeff

## 2017-11-05 NOTE — Telephone Encounter (Signed)
lori has been informed.

## 2017-11-05 NOTE — Progress Notes (Addendum)
No chief complaint on file.  F/u with daughter Rockwell Germany [phone] who is concerned about dad and wants to know if we are doing everything possible 1. Confusion noted and time and problems with recall per daughter pt lives alone but other daughter Inez Catalina checks on him throughout the day as Rockwell Germany lives in [place] -He needs a refill of lactulose and is only able to do 1x per day was sent home from the hospital with this medication 45 ml qd but he does not like how it makes his stomach fill    2. DM 2 A1C 8.9 09/23/17  c/o variable cbg from 50s to >400s on levemir 37 units qhs and novolog 6-8 units 2x per day. He is having night sweats when sleeping but does not check cbg   3. C/o left big toe blood blister that ruptured and toe is black denies trauma or pain daughter is soaking in epsolm salt and using otc topical antibacterial oint. Toe has been like this x 1 week   4. Liver cirrhosis TIPS on hold until he can get sodium up to 135 due to meld score 25 disc with daughter today. They want referral to [facility] for 2nd opinion but will keep Dr .Bailey Mech as local GI doctor and he has renal appt 11/11/17 had to resch due to being in the hospital.   5. Hypothyroidism on levo 50 mcg now will repeat TSH today to make sure trending down 10/09/17 TSH was 19.570  Review of Systems  Constitutional:       Wt gain of 5 lbs since last visit    HENT: Negative for hearing loss.   Eyes: Negative for blurred vision.  Respiratory: Negative for shortness of breath.   Cardiovascular: Negative for chest pain.  Gastrointestinal: Negative for abdominal pain.       Chronic ab distension due to ascites    Musculoskeletal: Negative for falls.  Skin: Negative for rash.       Toe left black and bruised    Neurological: Negative for headaches.  Psychiatric/Behavioral: Negative for depression.       +confusion   Past Medical History:  Diagnosis Date  . Anemia   . Ascites   . BPH (benign prostatic hyperplasia)    . Cirrhosis ([place])   . CKD (chronic kidney disease) stage 3, GFR 30-59 ml/min (HCC)   . Clear cell renal cell carcinoma ([place]) 2014   Left Nephrectomy.  . Colon polyps   . Diabetes mellitus without complication ([place])    type 2   . Dyspnea    with exertion  . GERD (gastroesophageal reflux disease)   . History of gout   . History of nephrectomy    Left  . Hypertension   . Neuropathy   . Renal Cancer    Renal Cancer  . Renal cell carcinoma of left kidney (HCC)    mets to lungs  . Umbilical hernia    Past Surgical History:  Procedure Laterality Date  . BACK SURGERY     ruptured disc 1991   . ENDOBRONCHIAL ULTRASOUND N/A 10/14/2015   Procedure: ENDOBRONCHIAL ULTRASOUND;  Surgeon: [name], MD;  Location: [facility] ORS;  Service: Pulmonary;  Laterality: N/A;  . ESOPHAGOGASTRODUODENOSCOPY (EGD) WITH PROPOFOL N/A 04/15/2017   Procedure: ESOPHAGOGASTRODUODENOSCOPY (EGD) WITH PROPOFOL;  Surgeon: Jonathon Bellows, MD;  Location: [facility] ENDOSCOPY;  Service: Gastroenterology;  Laterality: N/A;  Screen for esophageal varices  . EYE SURGERY Bilateral    Cataract Extraction with IOL  . IR RADIOLOGIST  EVAL & MGMT  10/15/2017  . KNEE SURGERY Right   . NEPHRECTOMY     left kidney 2014 renal cell cancer    Family History  Problem Relation Age of Onset  . Ovarian cancer Mother   . Diabetes Father   . Hypertension Father    Social History   Socioeconomic History  . Marital status: Divorced    Spouse name: Not on file  . Number of children: 2  . Years of education: Not on file  . Highest education level: Not on file  Occupational History  . Not on file  Social Needs  . Financial resource strain: Not hard at all  . Food insecurity:    Worry: Never true    Inability: Never true  . Transportation needs:    Medical: No    Non-medical: No  Tobacco Use  . Smoking status: Former Smoker    Packs/day: 1.00    Types: Cigarettes    Last attempt to quit: 04/15/1993    Years since  quitting: 24.5  . Smokeless tobacco: Never Used  Substance and Sexual Activity  . Alcohol use: Not Currently  . Drug use: No  . Sexual activity: Yes  Lifestyle  . Physical activity:    Days per week: Patient refused    Minutes per session: Patient refused  . Stress: Patient refused  Relationships  . Social connections:    Talks on phone: Patient refused    Gets together: Patient refused    Attends religious service: Patient refused    Active member of club or organization: Patient refused    Attends meetings of clubs or organizations: Patient refused    Relationship status: Patient refused  . Intimate partner violence:    Fear of current or ex partner: Patient refused    Emotionally abused: Patient refused    Physically abused: Patient refused    Forced sexual activity: Patient refused  Other Topics Concern  . Not on file  Social History Narrative   HS graduate    Retired    Divorced    2 daughters    Lives alone    [name] to travel to [employer]    1 living brother who is doctor cardiologist in [place] [phone] emergency contact    Has guns, wears seat belt    Current Meds  Medication Sig  . allopurinol (ZYLOPRIM) 100 MG tablet Take 1 tablet (100 mg total) by mouth daily.  Marland Kitchen ALPRAZolam (XANAX) 1 MG tablet Take 1 mg by mouth 2 (two) times daily as needed for anxiety or sleep.   Marland Kitchen aspirin EC 81 MG tablet Take 81 mg by mouth daily.  . feeding supplement, ENSURE ENLIVE, (ENSURE ENLIVE) LIQD Take 237 mLs by mouth 3 (three) times daily between meals.  . furosemide (LASIX) 20 MG tablet Take 2 tablets (40 mg total) by mouth daily. In am  . gabapentin (NEURONTIN) 100 MG capsule Take 2 capsules (200 mg total) by mouth 3 (three) times daily. (Patient taking differently: Take 200 mg by mouth 2 (two) times daily. )  . insulin aspart (NOVOLOG) 100 UNIT/ML FlexPen Before meals 131-180 2 units, 181-240 4 units, 241-300 6 units, 301-350 8 units, 351-400 10 units, >400 12 units   . Insulin Detemir (LEVEMIR FLEXTOUCH) 100 UNIT/ML Pen Inject 30 Units into the skin daily at 10 pm.  . Insulin Pen Needle 32G X 4 MM MISC 1 Device by Does not apply route 2 (two) times daily. E11.9  . lactulose (  CHRONULAC) 10 GM/15ML solution Take 45 mLs (30 g total) by mouth daily.  Marland Kitchen levothyroxine (SYNTHROID, LEVOTHROID) 50 MCG tablet Take 1 tablet (50 mcg total) by mouth daily before breakfast. 30 minutes  . lovastatin (MEVACOR) 20 MG tablet Take 1 tablet (20 mg total) by mouth daily at 6 PM.  . magic mouthwash w/lidocaine SOLN Take 5 mLs by mouth 3 (three) times daily as needed for mouth pain. Swish and spit  . ONE TOUCH ULTRA TEST test strip Bid  . oxyCODONE (ROXICODONE) 5 MG immediate release tablet Take 1 tablet (5 mg total) by mouth every 6 (six) hours as needed for severe pain or breakthrough pain.  . tamsulosin (FLOMAX) 0.4 MG CAPS capsule Take 1 capsule (0.4 mg total) by mouth every other day. With supper  . valACYclovir (VALTREX) 1000 MG tablet Take 1 tablet (1,000 mg total) by mouth 2 (two) times daily. X 5-10 days with food   No Known Allergies Recent Results (from the past 2160 hour(s))  Thyroid Panel With TSH     Status: Abnormal   Collection Time: 08/21/17  1:06 PM  Result Value Ref Range   TSH 8.370 (H) 0.450 - 4.500 uIU/mL   T4, Total 6.4 4.5 - 12.0 ug/dL   T3 Uptake Ratio 30 24 - 39 %   Free Thyroxine Index 1.9 1.2 - 4.9    Comment: (NOTE) Performed At: [facility] [place], [place] [id number] Rush Farmer MD [id number]   CBC with Differential/Platelet     Status: Abnormal   Collection Time: 08/21/17  1:06 PM  Result Value Ref Range   WBC 3.1 (L) 3.8 - 10.6 K/uL   RBC 4.16 (L) 4.40 - 5.90 MIL/uL   Hemoglobin 11.2 (L) 13.0 - 18.0 g/dL   HCT 33.6 (L) 40.0 - 52.0 %   MCV 80.9 80.0 - 100.0 fL   MCH 27.1 26.0 - 34.0 pg   MCHC 33.4 32.0 - 36.0 g/dL   RDW 15.5 (H) 11.5 - 14.5 %   Platelets 126 (L) 150 - 440 K/uL   Neutrophils Relative %  69 %   Neutro Abs 2.1 1.4 - 6.5 K/uL   Lymphocytes Relative 14 %   Lymphs Abs 0.4 (L) 1.0 - 3.6 K/uL   Monocytes Relative 11 %   Monocytes Absolute 0.4 0.2 - 1.0 K/uL   Eosinophils Relative 5 %   Eosinophils Absolute 0.1 0 - 0.7 K/uL   Basophils Relative 1 %   Basophils Absolute 0.0 0 - 0.1 K/uL    Comment: Performed at [facility], [address] [place], [place] [address]  Comprehensive metabolic panel     Status: Abnormal   Collection Time: 08/21/17  1:06 PM  Result Value Ref Range   Sodium 124 (L) 135 - 145 mmol/L   Potassium 5.5 (H) 3.5 - 5.1 mmol/L   Chloride 88 (L) 98 - 111 mmol/L   CO2 27 22 - 32 mmol/L   Glucose, Bld 256 (H) 70 - 99 mg/dL   BUN 23 8 - 23 mg/dL   Creatinine, Ser 1.38 (H) 0.61 - 1.24 mg/dL   Calcium 8.5 (L) 8.9 - 10.3 mg/dL   Total Protein 7.1 6.5 - 8.1 g/dL   Albumin 3.1 (L) 3.5 - 5.0 g/dL   AST 55 (H) 15 - 41 U/L   ALT 27 0 - 44 U/L   Alkaline Phosphatase 516 (H) 38 - 126 U/L   Total Bilirubin 2.0 (H) 0.3 - 1.2 mg/dL  GFR calc non Af Amer 54 (L) >60 mL/min   GFR calc Af Amer >60 >60 mL/min    Comment: (NOTE) The eGFR has been calculated using the CKD EPI equation. This calculation has not been validated in all clinical situations. eGFR's persistently <60 mL/min signify possible Chronic Kidney Disease.    Anion gap 9 5 - 15    Comment: Performed at [facility], [address] [place], [place] [address]  CBC with Differential/Platelet     Status: Abnormal   Collection Time: 09/11/17 10:31 AM  Result Value Ref Range   WBC 3.9 3.8 - 10.6 K/uL   RBC 4.29 (L) 4.40 - 5.90 MIL/uL   Hemoglobin 11.9 (L) 13.0 - 18.0 g/dL   HCT 35.2 (L) 40.0 - 52.0 %   MCV 82.0 80.0 - 100.0 fL   MCH 27.7 26.0 - 34.0 pg   MCHC 33.7 32.0 - 36.0 g/dL   RDW 15.0 (H) 11.5 - 14.5 %   Platelets 159 150 - 440 K/uL   Neutrophils Relative % 61 %   Neutro Abs 2.4 1.4 - 6.5 K/uL   Lymphocytes Relative 19 %   Lymphs Abs 0.7 (L) 1.0 - 3.6 K/uL   Monocytes  Relative 13 %   Monocytes Absolute 0.5 0.2 - 1.0 K/uL   Eosinophils Relative 6 %   Eosinophils Absolute 0.2 0 - 0.7 K/uL   Basophils Relative 1 %   Basophils Absolute 0.0 0 - 0.1 K/uL    Comment: Performed at [facility], [address] [place], [place] [address]  Comprehensive metabolic panel     Status: Abnormal   Collection Time: 09/11/17 10:31 AM  Result Value Ref Range   Sodium 119 (LL) 135 - 145 mmol/L    Comment: RESULTS VERIFIED BY REPEAT TESTING CRITICAL RESULT CALLED TO, READ BACK BY AND VERIFIED WITH [facility] [place] AT 10:58 09/11/2017 KMR    Potassium 5.2 (H) 3.5 - 5.1 mmol/L   Chloride 85 (L) 98 - 111 mmol/L   CO2 25 22 - 32 mmol/L   Glucose, Bld 69 (L) 70 - 99 mg/dL   BUN 19 8 - 23 mg/dL   Creatinine, Ser 1.20 0.61 - 1.24 mg/dL   Calcium 9.0 8.9 - 10.3 mg/dL   Total Protein 7.2 6.5 - 8.1 g/dL   Albumin 3.3 (L) 3.5 - 5.0 g/dL   AST 51 (H) 15 - 41 U/L   ALT 23 0 - 44 U/L   Alkaline Phosphatase 417 (H) 38 - 126 U/L   Total Bilirubin 1.9 (H) 0.3 - 1.2 mg/dL   GFR calc non Af Amer >60 >60 mL/min   GFR calc Af Amer >60 >60 mL/min    Comment: (NOTE) The eGFR has been calculated using the CKD EPI equation. This calculation has not been validated in all clinical situations. eGFR's persistently <60 mL/min signify possible Chronic Kidney Disease.    Anion gap 9 5 - 15    Comment: Performed at [facility], [address] [place],  [address]  Thyroid Panel With TSH     Status: Abnormal   Collection Time: 09/11/17 10:31 AM  Result Value Ref Range   TSH 9.560 (H) 0.450 - 4.500 uIU/mL   T4, Total 8.8 4.5 - 12.0 ug/dL   T3 Uptake Ratio 31 24 - 39 %   Free Thyroxine Index 2.7 1.2 - 4.9    Comment: (NOTE) Performed At: [facility] [place], [place] [id number] Rush Farmer MD [id number]  Basic metabolic panel     Status: Abnormal   Collection Time: 09/13/17  9:44 PM  Result Value Ref Range   Sodium 117 (LL) 135 - 145  mmol/L    Comment: CRITICAL RESULT CALLED TO, READ BACK BY AND VERIFIED WITH JOHN HOFFMASTER 09/13/17 2224 JML    Potassium 5.2 (H) 3.5 - 5.1 mmol/L   Chloride 85 (L) 98 - 111 mmol/L   CO2 25 22 - 32 mmol/L   Glucose, Bld 243 (H) 70 - 99 mg/dL   BUN 19 8 - 23 mg/dL   Creatinine, Ser 1.35 (H) 0.61 - 1.24 mg/dL   Calcium 8.2 (L) 8.9 - 10.3 mg/dL   GFR calc non Af Amer 55 (L) >60 mL/min   GFR calc Af Amer >60 >60 mL/min    Comment: (NOTE) The eGFR has been calculated using the CKD EPI equation. This calculation has not been validated in all clinical situations. eGFR's persistently <60 mL/min signify possible Chronic Kidney Disease.    Anion gap 7 5 - 15    Comment: Performed at [facility], [address] [place], [place] [address]  Hepatic function panel     Status: Abnormal   Collection Time: 09/13/17  9:44 PM  Result Value Ref Range   Total Protein 6.6 6.5 - 8.1 g/dL   Albumin 3.0 (L) 3.5 - 5.0 g/dL   AST 52 (H) 15 - 41 U/L   ALT 27 0 - 44 U/L   Alkaline Phosphatase 444 (H) 38 - 126 U/L   Total Bilirubin 1.7 (H) 0.3 - 1.2 mg/dL   Bilirubin, Direct 0.7 (H) 0.0 - 0.2 mg/dL   Indirect Bilirubin 1.0 (H) 0.3 - 0.9 mg/dL    Comment: Performed at [facility], [address] [place], [place] [address]  CBC with Differential     Status: Abnormal   Collection Time: 09/13/17  9:44 PM  Result Value Ref Range   WBC 3.3 (L) 3.8 - 10.6 K/uL   RBC 4.28 (L) 4.40 - 5.90 MIL/uL   Hemoglobin 12.0 (L) 13.0 - 18.0 g/dL   HCT 35.9 (L) 40.0 - 52.0 %   MCV 83.9 80.0 - 100.0 fL   MCH 28.0 26.0 - 34.0 pg   MCHC 33.4 32.0 - 36.0 g/dL   RDW 15.6 (H) 11.5 - 14.5 %   Platelets 137 (L) 150 - 440 K/uL   Neutrophils Relative % 65 %   Neutro Abs 2.2 1.4 - 6.5 K/uL   Lymphocytes Relative 17 %   Lymphs Abs 0.5 (L) 1.0 - 3.6 K/uL   Monocytes Relative 11 %   Monocytes Absolute 0.4 0.2 - 1.0 K/uL   Eosinophils Relative 6 %   Eosinophils Absolute 0.2 0 - 0.7 K/uL   Basophils  Relative 1 %   Basophils Absolute 0.0 0 - 0.1 K/uL    Comment: Performed at [facility], [address] [place], [place] [address]  Protime-INR     Status: None   Collection Time: 09/13/17  9:44 PM  Result Value Ref Range   Prothrombin Time 14.6 11.4 - 15.2 seconds   INR 1.15     Comment: Performed at [facility], [address], [place], [place] [address]  Basic metabolic panel     Status: Abnormal   Collection Time: 09/14/17  4:43 AM  Result Value Ref Range   Sodium 122 (L) 135 - 145 mmol/L   Potassium 4.5 3.5 - 5.1 mmol/L   Chloride 92 (L) 98 -  111 mmol/L   CO2 26 22 - 32 mmol/L   Glucose, Bld 110 (H) 70 - 99 mg/dL   BUN 16 8 - 23 mg/dL   Creatinine, Ser 1.24 0.61 - 1.24 mg/dL   Calcium 8.0 (L) 8.9 - 10.3 mg/dL   GFR calc non Af Amer >60 >60 mL/min   GFR calc Af Amer >60 >60 mL/min    Comment: (NOTE) The eGFR has been calculated using the CKD EPI equation. This calculation has not been validated in all clinical situations. eGFR's persistently <60 mL/min signify possible Chronic Kidney Disease.    Anion gap 4 (L) 5 - 15    Comment: Performed at [facility], [address] [place], [place] [address]  CBC     Status: Abnormal   Collection Time: 09/14/17  4:43 AM  Result Value Ref Range   WBC 3.5 (L) 3.8 - 10.6 K/uL   RBC 3.88 (L) 4.40 - 5.90 MIL/uL   Hemoglobin 10.9 (L) 13.0 - 18.0 g/dL   HCT 32.5 (L) 40.0 - 52.0 %   MCV 83.7 80.0 - 100.0 fL   MCH 28.2 26.0 - 34.0 pg   MCHC 33.7 32.0 - 36.0 g/dL   RDW 16.0 (H) 11.5 - 14.5 %   Platelets 113 (L) 150 - 440 K/uL    Comment: Performed at [facility], [address] [place], [place] [address]  Glucose, capillary     Status: Abnormal   Collection Time: 09/14/17  7:50 AM  Result Value Ref Range   Glucose-Capillary 139 (H) 70 - 99 mg/dL  Glucose, capillary     Status: Abnormal   Collection Time: 09/14/17 11:32 AM  Result Value Ref Range   Glucose-Capillary 139 (H) 70 - 99 mg/dL   Glucose, capillary     Status: Abnormal   Collection Time: 09/14/17  4:33 PM  Result Value Ref Range   Glucose-Capillary 230 (H) 70 - 99 mg/dL  Glucose, capillary     Status: Abnormal   Collection Time: 09/14/17  8:40 PM  Result Value Ref Range   Glucose-Capillary 236 (H) 70 - 99 mg/dL  Basic metabolic panel     Status: Abnormal   Collection Time: 09/15/17  5:06 AM  Result Value Ref Range   Sodium 130 (L) 135 - 145 mmol/L   Potassium 4.9 3.5 - 5.1 mmol/L   Chloride 96 (L) 98 - 111 mmol/L   CO2 26 22 - 32 mmol/L   Glucose, Bld 59 (L) 70 - 99 mg/dL   BUN 20 8 - 23 mg/dL   Creatinine, Ser 1.87 (H) 0.61 - 1.24 mg/dL   Calcium 8.2 (L) 8.9 - 10.3 mg/dL   GFR calc non Af Amer 37 (L) >60 mL/min   GFR calc Af Amer 43 (L) >60 mL/min    Comment: (NOTE) The eGFR has been calculated using the CKD EPI equation. This calculation has not been validated in all clinical situations. eGFR's persistently <60 mL/min signify possible Chronic Kidney Disease.    Anion gap 8 5 - 15    Comment: Performed at [facility], [address] [place], [place] [address]  Ferritin     Status: None   Collection Time: 09/15/17  5:06 AM  Result Value Ref Range   Ferritin 54 24 - 336 ng/mL    Comment: Performed at [facility], [address] [place], [place] [address]  Iron and TIBC     Status: Abnormal   Collection Time: 09/15/17  5:06  AM  Result Value Ref Range   Iron 55 45 - 182 ug/dL   TIBC 243 (L) 250 - 450 ug/dL   Saturation Ratios 23 17.9 - 39.5 %   UIBC 188 ug/dL    Comment: Performed at [facility], [address] [place], [place] [address]  Vitamin B12     Status: None   Collection Time: 09/15/17  5:06 AM  Result Value Ref Range   Vitamin B-12 731 180 - 914 pg/mL    Comment: (NOTE) This assay is not validated for testing neonatal or myeloproliferative syndrome specimens for Vitamin B12 levels. Performed at [facility], [place] [address], [place],  [place] [address]   Folate     Status: None   Collection Time: 09/15/17  5:06 AM  Result Value Ref Range   Folate 15.8 >5.9 ng/mL    Comment: Performed at [facility], [address] [place], [place] [address]  Glucose, capillary     Status: Abnormal   Collection Time: 09/15/17  7:19 AM  Result Value Ref Range   Glucose-Capillary 40 (LL) 70 - 99 mg/dL    Comment: REPEATED TO VERIFY, CHARGE CREDITED TEST WILL BE CREDITED Performed at [facility], [address] [place], [place] [address]    Comment 1 Notify RN   Glucose, capillary     Status: Abnormal   Collection Time: 09/15/17  7:21 AM  Result Value Ref Range   Glucose-Capillary 41 (LL) 70 - 99 mg/dL   Comment 1 Notify RN   Glucose, capillary     Status: Abnormal   Collection Time: 09/15/17  7:40 AM  Result Value Ref Range   Glucose-Capillary 152 (H) 70 - 99 mg/dL  MRSA PCR Screening     Status: None   Collection Time: 09/15/17  8:17 AM  Result Value Ref Range   MRSA by PCR NEGATIVE NEGATIVE    Comment:        The GeneXpert MRSA Assay (FDA approved for NASAL specimens only), is one component of a comprehensive MRSA colonization surveillance program. It is not intended to diagnose MRSA infection nor to guide or monitor treatment for MRSA infections. Performed at [facility], [address] [place], [place] [address]   Cortisol     Status: None   Collection Time: 09/15/17  9:46 AM  Result Value Ref Range   Cortisol, Plasma 35.0 ug/dL    Comment: (NOTE) AM    6.7 - 22.6 ug/dL PM   <10.0       ug/dL Performed at [facility] [address] [place], [place] [address]   TSH     Status: Abnormal   Collection Time: 09/15/17  9:46 AM  Result Value Ref Range   TSH 5.612 (H) 0.350 - 4.500 uIU/mL    Comment: Performed by a 3rd Generation assay with a functional sensitivity of <=0.01 uIU/mL. Performed at [facility], [address] [place], [place] [address]   Glucose,  capillary     Status: None   Collection Time: 09/15/17 11:39 AM  Result Value Ref Range   Glucose-Capillary 99 70 - 99 mg/dL  Glucose, capillary     Status: Abnormal   Collection Time: 09/15/17  3:01 PM  Result Value Ref Range   Glucose-Capillary 169 (H) 70 - 99 mg/dL  Glucose, capillary     Status: Abnormal   Collection Time: 09/15/17  3:47 PM  Result Value Ref Range   Glucose-Capillary 190 (H)  70 - 99 mg/dL  Glucose, capillary     Status: Abnormal   Collection Time: 09/15/17 10:21 PM  Result Value Ref Range   Glucose-Capillary 206 (H) 70 - 99 mg/dL  Magnesium     Status: None   Collection Time: 09/16/17  5:32 AM  Result Value Ref Range   Magnesium 1.8 1.7 - 2.4 mg/dL    Comment: Performed at [facility], [address] [place], [place] [address]  Phosphorus     Status: None   Collection Time: 09/16/17  5:32 AM  Result Value Ref Range   Phosphorus 4.6 2.5 - 4.6 mg/dL    Comment: Performed at [facility], [address] [place], [place] [address]  CBC     Status: Abnormal   Collection Time: 09/16/17  5:32 AM  Result Value Ref Range   WBC 11.9 (H) 3.8 - 10.6 K/uL   RBC 4.06 (L) 4.40 - 5.90 MIL/uL   Hemoglobin 11.4 (L) 13.0 - 18.0 g/dL   HCT 34.3 (L) 40.0 - 52.0 %   MCV 84.6 80.0 - 100.0 fL   MCH 28.0 26.0 - 34.0 pg   MCHC 33.1 32.0 - 36.0 g/dL   RDW 16.6 (H) 11.5 - 14.5 %   Platelets 106 (L) 150 - 440 K/uL    Comment: Performed at [facility], [address] [place], [place] [address]  Comprehensive metabolic panel     Status: Abnormal   Collection Time: 09/16/17  5:37 AM  Result Value Ref Range   Sodium 126 (L) 135 - 145 mmol/L   Potassium 5.5 (H) 3.5 - 5.1 mmol/L   Chloride 95 (L) 98 - 111 mmol/L   CO2 25 22 - 32 mmol/L   Glucose, Bld 243 (H) 70 - 99 mg/dL   BUN 33 (H) 8 - 23 mg/dL   Creatinine, Ser 1.59 (H) 0.61 - 1.24 mg/dL   Calcium 7.5 (L) 8.9 - 10.3 mg/dL   Total Protein 5.3 (L) 6.5 - 8.1 g/dL   Albumin 2.3 (L) 3.5 - 5.0  g/dL   AST 26 15 - 41 U/L   ALT 14 0 - 44 U/L   Alkaline Phosphatase 212 (H) 38 - 126 U/L   Total Bilirubin 3.2 (H) 0.3 - 1.2 mg/dL   GFR calc non Af Amer 45 (L) >60 mL/min   GFR calc Af Amer 52 (L) >60 mL/min    Comment: (NOTE) The eGFR has been calculated using the CKD EPI equation. This calculation has not been validated in all clinical situations. eGFR's persistently <60 mL/min signify possible Chronic Kidney Disease.    Anion gap 6 5 - 15    Comment: Performed at [facility], [address] [place], [place] [address]  Glucose, capillary     Status: Abnormal   Collection Time: 09/16/17  7:37 AM  Result Value Ref Range   Glucose-Capillary 212 (H) 70 - 99 mg/dL  T4     Status: Abnormal   Collection Time: 09/16/17  7:52 AM  Result Value Ref Range   T4, Total 4.3 (L) 4.5 - 12.0 ug/dL    Comment: (NOTE) Performed At: [facility] [address] [place], [place] [id number] Rush Farmer MD [id number]   T3, free     Status: Abnormal   Collection Time: 09/16/17  7:52 AM  Result Value Ref Range   T3, Free 1.4 (L) 2.0 - 4.4 pg/mL    Comment: (NOTE) Performed At: [facility] [place],  [place] [id number] Rush Farmer MD [id number]   Glucose, capillary     Status: Abnormal   Collection Time: 09/16/17  1:14 PM  Result Value Ref Range   Glucose-Capillary 250 (H) 70 - 99 mg/dL  Glucose, capillary     Status: Abnormal   Collection Time: 09/16/17  3:52 PM  Result Value Ref Range   Glucose-Capillary 298 (H) 70 - 99 mg/dL  Glucose, capillary     Status: Abnormal   Collection Time: 09/16/17  9:28 PM  Result Value Ref Range   Glucose-Capillary 430 (H) 70 - 99 mg/dL   Comment 1 Call MD NNP PA CNM   Glucose, capillary     Status: Abnormal   Collection Time: 09/16/17 11:04 PM  Result Value Ref Range   Glucose-Capillary 443 (H) 70 - 99 mg/dL  Glucose, capillary     Status: Abnormal   Collection Time: 09/17/17 12:37 AM  Result Value  Ref Range   Glucose-Capillary 387 (H) 70 - 99 mg/dL  Basic metabolic panel     Status: Abnormal   Collection Time: 09/17/17  3:31 AM  Result Value Ref Range   Sodium 124 (L) 135 - 145 mmol/L   Potassium 5.3 (H) 3.5 - 5.1 mmol/L   Chloride 92 (L) 98 - 111 mmol/L   CO2 26 22 - 32 mmol/L   Glucose, Bld 279 (H) 70 - 99 mg/dL   BUN 45 (H) 8 - 23 mg/dL   Creatinine, Ser 1.46 (H) 0.61 - 1.24 mg/dL   Calcium 7.7 (L) 8.9 - 10.3 mg/dL   GFR calc non Af Amer 50 (L) >60 mL/min   GFR calc Af Amer 58 (L) >60 mL/min    Comment: (NOTE) The eGFR has been calculated using the CKD EPI equation. This calculation has not been validated in all clinical situations. eGFR's persistently <60 mL/min signify possible Chronic Kidney Disease.    Anion gap 6 5 - 15    Comment: Performed at [facility], [address] [place], [place] [address]  CBC     Status: Abnormal   Collection Time: 09/17/17  3:31 AM  Result Value Ref Range   WBC 3.8 3.8 - 10.6 K/uL   RBC 3.35 (L) 4.40 - 5.90 MIL/uL   Hemoglobin 9.6 (L) 13.0 - 18.0 g/dL   HCT 28.3 (L) 40.0 - 52.0 %   MCV 84.4 80.0 - 100.0 fL   MCH 28.7 26.0 - 34.0 pg   MCHC 34.0 32.0 - 36.0 g/dL   RDW 16.4 (H) 11.5 - 14.5 %   Platelets 68 (L) 150 - 440 K/uL    Comment: RESULT REPEATED AND VERIFIED Performed at [facility], [address] [place], [place] [address]   Glucose, capillary     Status: Abnormal   Collection Time: 09/17/17  7:09 AM  Result Value Ref Range   Glucose-Capillary 190 (H) 70 - 99 mg/dL  Glucose, capillary     Status: Abnormal   Collection Time: 09/17/17  7:25 AM  Result Value Ref Range   Glucose-Capillary 175 (H) 70 - 99 mg/dL  Glucose, capillary     Status: Abnormal   Collection Time: 09/17/17 11:57 AM  Result Value Ref Range   Glucose-Capillary 237 (H) 70 - 99 mg/dL  Basic metabolic panel     Status: Abnormal   Collection Time: 09/17/17 12:27 PM  Result Value Ref Range   Sodium 124 (L) 135 - 145 mmol/L    Potassium 5.5 (H) 3.5 - 5.1 mmol/L  Chloride 93 (L) 98 - 111 mmol/L   CO2 24 22 - 32 mmol/L   Glucose, Bld 255 (H) 70 - 99 mg/dL   BUN 45 (H) 8 - 23 mg/dL   Creatinine, Ser 1.27 (H) 0.61 - 1.24 mg/dL   Calcium 7.8 (L) 8.9 - 10.3 mg/dL   GFR calc non Af Amer 59 (L) >60 mL/min   GFR calc Af Amer >60 >60 mL/min    Comment: (NOTE) The eGFR has been calculated using the CKD EPI equation. This calculation has not been validated in all clinical situations. eGFR's persistently <60 mL/min signify possible Chronic Kidney Disease.    Anion gap 7 5 - 15    Comment: Performed at [facility], [address] [place], [place] [address]  Glucose, capillary     Status: Abnormal   Collection Time: 09/17/17  4:40 PM  Result Value Ref Range   Glucose-Capillary 196 (H) 70 - 99 mg/dL  Glucose, capillary     Status: Abnormal   Collection Time: 09/17/17  9:55 PM  Result Value Ref Range   Glucose-Capillary 273 (H) 70 - 99 mg/dL  Basic metabolic panel     Status: Abnormal   Collection Time: 09/18/17  4:53 AM  Result Value Ref Range   Sodium 130 (L) 135 - 145 mmol/L   Potassium 4.2 3.5 - 5.1 mmol/L   Chloride 97 (L) 98 - 111 mmol/L   CO2 26 22 - 32 mmol/L   Glucose, Bld 180 (H) 70 - 99 mg/dL   BUN 49 (H) 8 - 23 mg/dL   Creatinine, Ser 1.16 0.61 - 1.24 mg/dL   Calcium 8.1 (L) 8.9 - 10.3 mg/dL   GFR calc non Af Amer >60 >60 mL/min   GFR calc Af Amer >60 >60 mL/min    Comment: (NOTE) The eGFR has been calculated using the CKD EPI equation. This calculation has not been validated in all clinical situations. eGFR's persistently <60 mL/min signify possible Chronic Kidney Disease.    Anion gap 7 5 - 15    Comment: Performed at [facility], [address] [place], [place] [address]  Glucose, capillary     Status: None   Collection Time: 09/18/17  7:44 AM  Result Value Ref Range   Glucose-Capillary 92 70 - 99 mg/dL   Comment 1 Notify RN   Lipase, blood     Status: None    Collection Time: 09/20/17  5:28 PM  Result Value Ref Range   Lipase 40 11 - 51 U/L    Comment: Performed at [facility], [address] [place], [place] [address]  Comprehensive metabolic panel     Status: Abnormal   Collection Time: 09/20/17  5:28 PM  Result Value Ref Range   Sodium 128 (L) 135 - 145 mmol/L   Potassium 4.8 3.5 - 5.1 mmol/L   Chloride 94 (L) 98 - 111 mmol/L   CO2 27 22 - 32 mmol/L   Glucose, Bld 194 (H) 70 - 99 mg/dL   BUN 32 (H) 8 - 23 mg/dL   Creatinine, Ser 1.15 0.61 - 1.24 mg/dL   Calcium 7.9 (L) 8.9 - 10.3 mg/dL   Total Protein 5.8 (L) 6.5 - 8.1 g/dL   Albumin 2.6 (L) 3.5 - 5.0 g/dL   AST 37 15 - 41 U/L   ALT 32 0 - 44 U/L   Alkaline Phosphatase 391 (H) 38 - 126 U/L   Total Bilirubin 3.9 (H) 0.3 - 1.2 mg/dL  GFR calc non Af Amer >60 >60 mL/min   GFR calc Af Amer >60 >60 mL/min    Comment: (NOTE) The eGFR has been calculated using the CKD EPI equation. This calculation has not been validated in all clinical situations. eGFR's persistently <60 mL/min signify possible Chronic Kidney Disease.    Anion gap 7 5 - 15    Comment: Performed at [facility], [address] [place], [place] [address]  CBC     Status: Abnormal   Collection Time: 09/20/17  5:28 PM  Result Value Ref Range   WBC 7.6 3.8 - 10.6 K/uL   RBC 4.48 4.40 - 5.90 MIL/uL   Hemoglobin 12.7 (L) 13.0 - 18.0 g/dL   HCT 37.5 (L) 40.0 - 52.0 %   MCV 83.7 80.0 - 100.0 fL   MCH 28.4 26.0 - 34.0 pg   MCHC 33.9 32.0 - 36.0 g/dL   RDW 15.8 (H) 11.5 - 14.5 %   Platelets 125 (L) 150 - 440 K/uL    Comment: Performed at [facility], [address] [place], [place] [address]  POCT glycosylated hemoglobin (Hb A1C)     Status: Abnormal   Collection Time: 09/23/17 11:02 AM  Result Value Ref Range   Hemoglobin A1C 8.9 (A) 4.0 - 5.6 %   HbA1c POC (<> result, manual entry)     HbA1c, POC (prediabetic range)     HbA1c, POC (controlled diabetic range)    Comprehensive  Metabolic Panel (CMET)     Status: Abnormal   Collection Time: 09/23/17  1:36 PM  Result Value Ref Range   Glucose 139 (H) 65 - 99 mg/dL   BUN 39 (H) 8 - 27 mg/dL   Creatinine, Ser 1.51 (H) 0.76 - 1.27 mg/dL   GFR calc non Af Amer 49 (L) >59 mL/min/1.73   GFR calc Af Amer 57 (L) >59 mL/min/1.73   BUN/Creatinine Ratio 26 (H) 10 - 24   Sodium 122 (L) 134 - 144 mmol/L   Potassium 5.5 (H) 3.5 - 5.2 mmol/L   Chloride 85 (L) 96 - 106 mmol/L   CO2 22 20 - 29 mmol/L   Calcium 7.9 (L) 8.6 - 10.2 mg/dL   Total Protein 5.6 (L) 6.0 - 8.5 g/dL   Albumin 2.8 (L) 3.6 - 4.8 g/dL   Globulin, Total 2.8 1.5 - 4.5 g/dL   Albumin/Globulin Ratio 1.0 (L) 1.2 - 2.2   Bilirubin Total 3.9 (H) 0.0 - 1.2 mg/dL   Alkaline Phosphatase 430 (H) 39 - 117 IU/L   AST 34 0 - 40 IU/L   ALT 22 0 - 44 IU/L  INR/PT     Status: Abnormal   Collection Time: 09/23/17  1:36 PM  Result Value Ref Range   INR 1.2 0.8 - 1.2    Comment: Reference interval is for non-anticoagulated patients. Suggested INR therapeutic range for Vitamin K antagonist therapy:    Standard Dose (moderate intensity                   therapeutic range):       2.0 - 3.0    Higher intensity therapeutic range       2.5 - 3.5    Prothrombin Time 12.4 (H) 9.1 - 12.0 sec  Basic Metabolic Panel (BMET)     Status: Abnormal   Collection Time: 09/24/17  2:58 PM  Result Value Ref Range   Glucose 133 (H) 65 - 99 mg/dL   BUN 37 (H) 8 -  27 mg/dL   Creatinine, Ser 1.35 (H) 0.76 - 1.27 mg/dL   GFR calc non Af Amer 56 (L) >59 mL/min/1.73   GFR calc Af Amer 65 >59 mL/min/1.73   BUN/Creatinine Ratio 27 (H) 10 - 24   Sodium 121 (L) 134 - 144 mmol/L   Potassium 5.5 (H) 3.5 - 5.2 mmol/L   Chloride 85 (L) 96 - 106 mmol/L   CO2 23 20 - 29 mmol/L   Calcium 8.1 (L) 8.6 - 10.2 mg/dL  Thyroid Panel With TSH     Status: Abnormal   Collection Time: 09/25/17  8:55 AM  Result Value Ref Range   TSH 12.550 (H) 0.450 - 4.500 uIU/mL   T4, Total 7.5 4.5 - 12.0 ug/dL   T3  Uptake Ratio 32 24 - 39 %   Free Thyroxine Index 2.4 1.2 - 4.9    Comment: (NOTE) Performed At: [facility] [place], [place] [id number] Rush Farmer MD [id number]   CBC with Differential/Platelet     Status: Abnormal   Collection Time: 09/25/17  8:55 AM  Result Value Ref Range   WBC 4.7 3.8 - 10.6 K/uL   RBC 3.72 (L) 4.40 - 5.90 MIL/uL   Hemoglobin 10.6 (L) 13.0 - 18.0 g/dL   HCT 31.4 (L) 40.0 - 52.0 %   MCV 84.5 80.0 - 100.0 fL   MCH 28.5 26.0 - 34.0 pg   MCHC 33.8 32.0 - 36.0 g/dL   RDW 16.3 (H) 11.5 - 14.5 %   Platelets 176 150 - 440 K/uL   Neutrophils Relative % 78 %   Neutro Abs 3.7 1.4 - 6.5 K/uL   Lymphocytes Relative 9 %   Lymphs Abs 0.4 (L) 1.0 - 3.6 K/uL   Monocytes Relative 10 %   Monocytes Absolute 0.5 0.2 - 1.0 K/uL   Eosinophils Relative 2 %   Eosinophils Absolute 0.1 0 - 0.7 K/uL   Basophils Relative 1 %   Basophils Absolute 0.0 0 - 0.1 K/uL    Comment: Performed at [facility], [address] [place], [place] [address]  Comprehensive metabolic panel     Status: Abnormal   Collection Time: 09/25/17  8:55 AM  Result Value Ref Range   Sodium 120 (L) 135 - 145 mmol/L   Potassium 5.1 3.5 - 5.1 mmol/L   Chloride 86 (L) 98 - 111 mmol/L   CO2 26 22 - 32 mmol/L   Glucose, Bld 303 (H) 70 - 99 mg/dL   BUN 38 (H) 8 - 23 mg/dL   Creatinine, Ser 1.48 (H) 0.61 - 1.24 mg/dL   Calcium 8.1 (L) 8.9 - 10.3 mg/dL   Total Protein 6.1 (L) 6.5 - 8.1 g/dL   Albumin 2.3 (L) 3.5 - 5.0 g/dL   AST 29 15 - 41 U/L   ALT 19 0 - 44 U/L   Alkaline Phosphatase 347 (H) 38 - 126 U/L   Total Bilirubin 2.9 (H) 0.3 - 1.2 mg/dL   GFR calc non Af Amer 49 (L) >60 mL/min   GFR calc Af Amer 57 (L) >60 mL/min    Comment: (NOTE) The eGFR has been calculated using the CKD EPI equation. This calculation has not been validated in all clinical situations. eGFR's persistently <60 mL/min signify possible Chronic Kidney Disease.    Anion gap 8 5 - 15     Comment: Performed at [facility], [address] [place],  [address]  CBC with Differential/Platelet     Status:  Abnormal   Collection Time: 10/09/17  9:22 AM  Result Value Ref Range   WBC 5.2 4.0 - 10.5 K/uL   RBC 3.83 (L) 4.22 - 5.81 MIL/uL   Hemoglobin 10.6 (L) 13.0 - 17.0 g/dL   HCT 32.0 (L) 39.0 - 52.0 %   MCV 83.6 80.0 - 100.0 fL   MCH 27.7 26.0 - 34.0 pg   MCHC 33.1 30.0 - 36.0 g/dL   RDW 16.3 (H) 11.5 - 15.5 %   Platelets 134 (L) 150 - 400 K/uL   nRBC 0.0 0.0 - 0.2 %   Neutrophils Relative % 77 %   Neutro Abs 3.9 1.7 - 7.7 K/uL   Lymphocytes Relative 10 %   Lymphs Abs 0.5 (L) 0.7 - 4.0 K/uL   Monocytes Relative 11 %   Monocytes Absolute 0.6 0.1 - 1.0 K/uL   Eosinophils Relative 2 %   Eosinophils Absolute 0.1 0.0 - 0.5 K/uL   Basophils Relative 0 %   Basophils Absolute 0.0 0.0 - 0.1 K/uL   Immature Granulocytes 0 %   Abs Immature Granulocytes 0.01 0.00 - 0.07 K/uL    Comment: Performed at [facility], [address] [place], [place] [address]  Comprehensive metabolic panel     Status: Abnormal   Collection Time: 10/09/17  9:22 AM  Result Value Ref Range   Sodium 120 (L) 135 - 145 mmol/L   Potassium 5.3 (H) 3.5 - 5.1 mmol/L   Chloride 85 (L) 98 - 111 mmol/L   CO2 28 22 - 32 mmol/L   Glucose, Bld 86 70 - 99 mg/dL   BUN 31 (H) 8 - 23 mg/dL   Creatinine, Ser 1.46 (H) 0.61 - 1.24 mg/dL   Calcium 8.0 (L) 8.9 - 10.3 mg/dL   Total Protein 7.0 6.5 - 8.1 g/dL   Albumin 2.2 (L) 3.5 - 5.0 g/dL   AST 46 (H) 15 - 41 U/L   ALT 18 0 - 44 U/L   Alkaline Phosphatase 343 (H) 38 - 126 U/L   Total Bilirubin 2.1 (H) 0.3 - 1.2 mg/dL   GFR calc non Af Amer 50 (L) >60 mL/min   GFR calc Af Amer 58 (L) >60 mL/min    Comment: (NOTE) The eGFR has been calculated using the CKD EPI equation. This calculation has not been validated in all clinical situations. eGFR's persistently <60 mL/min signify possible Chronic Kidney Disease.    Anion gap 7 5 - 15    Comment:  Performed at [facility], [address] [place], [place] [address]  Thyroid Panel With TSH     Status: Abnormal   Collection Time: 10/09/17  9:22 AM  Result Value Ref Range   TSH 19.570 (H) 0.450 - 4.500 uIU/mL   T4, Total 6.1 4.5 - 12.0 ug/dL   T3 Uptake Ratio 34 24 - 39 %   Free Thyroxine Index 2.1 1.2 - 4.9    Comment: (NOTE) Performed At: [facility] [address] [place], [place] [id number] Rush Farmer MD [id number]   Lipase, blood     Status: None   Collection Time: 10/18/17 10:03 AM  Result Value Ref Range   Lipase 31 11 - 51 U/L    Comment: Performed at [facility], [address] [place], [place] [address]  Comprehensive metabolic panel     Status: Abnormal   Collection Time: 10/18/17 10:03 AM  Result Value Ref Range   Sodium 121 (L) 135 - 145 mmol/L   Potassium  5.6 (H) 3.5 - 5.1 mmol/L   Chloride 86 (L) 98 - 111 mmol/L   CO2 24 22 - 32 mmol/L   Glucose, Bld 295 (H) 70 - 99 mg/dL   BUN 19 8 - 23 mg/dL   Creatinine, Ser 1.43 (H) 0.61 - 1.24 mg/dL   Calcium 7.7 (L) 8.9 - 10.3 mg/dL   Total Protein 6.7 6.5 - 8.1 g/dL   Albumin 2.0 (L) 3.5 - 5.0 g/dL   AST 31 15 - 41 U/L   ALT 14 0 - 44 U/L   Alkaline Phosphatase 289 (H) 38 - 126 U/L   Total Bilirubin 3.7 (H) 0.3 - 1.2 mg/dL   GFR calc non Af Amer 51 (L) >60 mL/min   GFR calc Af Amer 60 (L) >60 mL/min    Comment: (NOTE) The eGFR has been calculated using the CKD EPI equation. This calculation has not been validated in all clinical situations. eGFR's persistently <60 mL/min signify possible Chronic Kidney Disease.    Anion gap 11 5 - 15    Comment: Performed at [facility], [address] [place], [place] [address]  CBC     Status: Abnormal   Collection Time: 10/18/17 10:03 AM  Result Value Ref Range   WBC 6.1 4.0 - 10.5 K/uL   RBC 4.00 (L) 4.22 - 5.81 MIL/uL   Hemoglobin 11.5 (L) 13.0 - 17.0 g/dL   HCT 33.9 (L) 39.0 - 52.0 %   MCV 84.8 80.0 - 100.0 fL   MCH  28.8 26.0 - 34.0 pg   MCHC 33.9 30.0 - 36.0 g/dL   RDW 18.2 (H) 11.5 - 15.5 %   Platelets 145 (L) 150 - 400 K/uL   nRBC 0.0 0.0 - 0.2 %    Comment: Performed at [facility], [address] [place], [place] [address]  Urinalysis, Complete w Microscopic     Status: Abnormal   Collection Time: 10/18/17 10:03 AM  Result Value Ref Range   Color, Urine AMBER (A) YELLOW    Comment: BIOCHEMICALS MAY BE AFFECTED BY COLOR   APPearance CLEAR (A) CLEAR   Specific Gravity, Urine 1.012 1.005 - 1.030   pH 5.0 5.0 - 8.0   Glucose, UA 150 (A) NEGATIVE mg/dL   Hgb urine dipstick NEGATIVE NEGATIVE   Bilirubin Urine NEGATIVE NEGATIVE   Ketones, ur 5 (A) NEGATIVE mg/dL   Protein, ur NEGATIVE NEGATIVE mg/dL   Nitrite NEGATIVE NEGATIVE   Leukocytes, UA NEGATIVE NEGATIVE   RBC / HPF 0-5 0 - 5 RBC/hpf   WBC, UA 0-5 0 - 5 WBC/hpf   Bacteria, UA NONE SEEN NONE SEEN   Squamous Epithelial / LPF 0-5 0 - 5   Mucus PRESENT    Hyaline Casts, UA PRESENT     Comment: Performed at [facility], [address] [place], [place] [address]  Ammonia     Status: None   Collection Time: 10/18/17 10:03 AM  Result Value Ref Range   Ammonia 17 9 - 35 umol/L    Comment: Performed at [facility], [address] [place], [place] [address]  Protime-INR     Status: Abnormal   Collection Time: 10/18/17 10:03 AM  Result Value Ref Range   Prothrombin Time 17.5 (H) 11.4 - 15.2 seconds   INR 1.45     Comment: Performed at [facility], [address] [place], [place] [address]  Osmolality     Status: Abnormal   Collection Time: 10/18/17  4:15  PM  Result Value Ref Range   Osmolality 268 (L) 275 - 295 mOsm/kg    Comment: Performed at [facility], [address] [place], [place] [address]  Glucose, capillary     Status: Abnormal   Collection Time: 10/19/17 12:21 AM  Result Value Ref Range   Glucose-Capillary 326 (H) 70 - 99 mg/dL  Basic metabolic panel     Status: Abnormal    Collection Time: 10/19/17  3:59 AM  Result Value Ref Range   Sodium 122 (L) 135 - 145 mmol/L   Potassium 5.3 (H) 3.5 - 5.1 mmol/L   Chloride 88 (L) 98 - 111 mmol/L   CO2 24 22 - 32 mmol/L   Glucose, Bld 303 (H) 70 - 99 mg/dL   BUN 22 8 - 23 mg/dL   Creatinine, Ser 1.63 (H) 0.61 - 1.24 mg/dL   Calcium 7.5 (L) 8.9 - 10.3 mg/dL   GFR calc non Af Amer 44 (L) >60 mL/min   GFR calc Af Amer 51 (L) >60 mL/min    Comment: (NOTE) The eGFR has been calculated using the CKD EPI equation. This calculation has not been validated in all clinical situations. eGFR's persistently <60 mL/min signify possible Chronic Kidney Disease.    Anion gap 10 5 - 15    Comment: Performed at [facility], [address] [place], [place] [address]  CBC     Status: Abnormal   Collection Time: 10/19/17  3:59 AM  Result Value Ref Range   WBC 6.7 4.0 - 10.5 K/uL   RBC 3.67 (L) 4.22 - 5.81 MIL/uL   Hemoglobin 10.4 (L) 13.0 - 17.0 g/dL   HCT 31.4 (L) 39.0 - 52.0 %   MCV 85.6 80.0 - 100.0 fL   MCH 28.3 26.0 - 34.0 pg   MCHC 33.1 30.0 - 36.0 g/dL   RDW 18.4 (H) 11.5 - 15.5 %   Platelets 137 (L) 150 - 400 K/uL   nRBC 0.0 0.0 - 0.2 %    Comment: Performed at [facility], [address] [place], [place] [address]  Glucose, capillary     Status: Abnormal   Collection Time: 10/19/17  7:40 AM  Result Value Ref Range   Glucose-Capillary 303 (H) 70 - 99 mg/dL  Sodium     Status: Abnormal   Collection Time: 10/19/17 10:41 AM  Result Value Ref Range   Sodium 122 (L) 135 - 145 mmol/L    Comment: Performed at [facility], [address] [place], [place] [address]  Glucose, capillary     Status: Abnormal   Collection Time: 10/19/17 11:34 AM  Result Value Ref Range   Glucose-Capillary 286 (H) 70 - 99 mg/dL  Sodium     Status: Abnormal   Collection Time: 10/19/17  4:20 PM  Result Value Ref Range   Sodium 120 (L) 135 - 145 mmol/L    Comment: Performed at [facility], [address] [place], [place] [address]  Glucose, capillary     Status: Abnormal   Collection Time: 10/19/17  4:57 PM  Result Value Ref Range   Glucose-Capillary 218 (H) 70 - 99 mg/dL  Glucose, capillary     Status: Abnormal   Collection Time: 10/19/17  9:44 PM  Result Value Ref Range   Glucose-Capillary 285 (H) 70 - 99 mg/dL  Sodium     Status: Abnormal   Collection Time: 10/19/17  9:59 PM  Result Value Ref Range   Sodium 123 (  L) 135 - 145 mmol/L    Comment: Performed at [facility], [address] [place], [place] [address]  Comprehensive metabolic panel     Status: Abnormal   Collection Time: 10/20/17  3:00 AM  Result Value Ref Range   Sodium 125 (L) 135 - 145 mmol/L   Potassium 4.0 3.5 - 5.1 mmol/L   Chloride 89 (L) 98 - 111 mmol/L   CO2 27 22 - 32 mmol/L   Glucose, Bld 296 (H) 70 - 99 mg/dL   BUN 21 8 - 23 mg/dL   Creatinine, Ser 1.26 (H) 0.61 - 1.24 mg/dL   Calcium 7.7 (L) 8.9 - 10.3 mg/dL   Total Protein 5.6 (L) 6.5 - 8.1 g/dL   Albumin 2.4 (L) 3.5 - 5.0 g/dL   AST 26 15 - 41 U/L   ALT 13 0 - 44 U/L   Alkaline Phosphatase 232 (H) 38 - 126 U/L   Total Bilirubin 2.4 (H) 0.3 - 1.2 mg/dL   GFR calc non Af Amer 60 (L) >60 mL/min   GFR calc Af Amer >60 >60 mL/min    Comment: (NOTE) The eGFR has been calculated using the CKD EPI equation. This calculation has not been validated in all clinical situations. eGFR's persistently <60 mL/min signify possible Chronic Kidney Disease.    Anion gap 9 5 - 15    Comment: Performed at [facility], [place], [place] [address]  Glucose, capillary     Status: Abnormal   Collection Time: 10/20/17  7:42 AM  Result Value Ref Range   Glucose-Capillary 322 (H) 70 - 99 mg/dL  Sodium     Status: Abnormal   Collection Time: 10/20/17  9:44 AM  Result Value Ref Range   Sodium 121 (L) 135 - 145 mmol/L    Comment: Performed at [facility], [address] [place], [place] [address]  Glucose,  capillary     Status: Abnormal   Collection Time: 10/20/17 12:03 PM  Result Value Ref Range   Glucose-Capillary 347 (H) 70 - 99 mg/dL  Sodium     Status: Abnormal   Collection Time: 10/20/17  5:25 PM  Result Value Ref Range   Sodium 125 (L) 135 - 145 mmol/L    Comment: Performed at [facility], [address] [place], [place] [address]  Glucose, capillary     Status: Abnormal   Collection Time: 10/20/17  5:43 PM  Result Value Ref Range   Glucose-Capillary 331 (H) 70 - 99 mg/dL  Glucose, capillary     Status: Abnormal   Collection Time: 10/20/17  9:15 PM  Result Value Ref Range   Glucose-Capillary 352 (H) 70 - 99 mg/dL  Sodium     Status: Abnormal   Collection Time: 10/21/17 12:34 AM  Result Value Ref Range   Sodium 126 (L) 135 - 145 mmol/L    Comment: Performed at [facility], [address] [place], [place] [address]  Sodium     Status: Abnormal   Collection Time: 10/21/17  4:27 AM  Result Value Ref Range   Sodium 127 (L) 135 - 145 mmol/L    Comment: Performed at [facility], [address] [place], [place] [address]  Glucose, capillary     Status: Abnormal   Collection Time: 10/21/17  7:44 AM  Result Value Ref Range   Glucose-Capillary 369 (H) 70 - 99 mg/dL  Sodium     Status: Abnormal   Collection Time: 10/21/17  9:54 AM  Result Value Ref Range   Sodium 128 (L) 135 - 145 mmol/L    Comment: Performed at [facility], [address] [place], [place] [address]  Glucose, capillary     Status: Abnormal   Collection Time: 10/21/17 11:39 AM  Result Value Ref Range   Glucose-Capillary 277 (H) 70 - 99 mg/dL  Sodium     Status: Abnormal   Collection Time: 10/21/17  4:15 PM  Result Value Ref Range   Sodium 126 (L) 135 - 145 mmol/L    Comment: Performed at [facility], [address] [place], [place] [address]  Glucose, capillary     Status: Abnormal   Collection Time: 10/21/17  4:56 PM  Result Value Ref Range    Glucose-Capillary 316 (H) 70 - 99 mg/dL  Glucose, capillary     Status: Abnormal   Collection Time: 10/21/17  8:28 PM  Result Value Ref Range   Glucose-Capillary 221 (H) 70 - 99 mg/dL  Sodium     Status: Abnormal   Collection Time: 10/21/17  9:48 PM  Result Value Ref Range   Sodium 129 (L) 135 - 145 mmol/L    Comment: Performed at [facility], [address] [place], [place] [address]  BUN     Status: None   Collection Time: 10/22/17  4:19 AM  Result Value Ref Range   BUN 15 8 - 23 mg/dL    Comment: Performed at [facility], [address] [place], [place] [address]  Creatinine, serum     Status: None   Collection Time: 10/22/17  4:19 AM  Result Value Ref Range   Creatinine, Ser 1.08 0.61 - 1.24 mg/dL   GFR calc non Af Amer >60 >60 mL/min   GFR calc Af Amer >60 >60 mL/min    Comment: (NOTE) The eGFR has been calculated using the CKD EPI equation. This calculation has not been validated in all clinical situations. eGFR's persistently <60 mL/min signify possible Chronic Kidney Disease. Performed at [facility], [address] [place], [place] [address]   Sodium     Status: Abnormal   Collection Time: 10/22/17  4:24 AM  Result Value Ref Range   Sodium 130 (L) 135 - 145 mmol/L    Comment: Performed at [facility], [address] [place], [place] [address]  Potassium     Status: Abnormal   Collection Time: 10/22/17  4:24 AM  Result Value Ref Range   Potassium 2.9 (L) 3.5 - 5.1 mmol/L    Comment: Performed at [facility], [address] [place], [place] [address]  Phosphorus     Status: None   Collection Time: 10/22/17  4:24 AM  Result Value Ref Range   Phosphorus 2.8 2.5 - 4.6 mg/dL    Comment: Performed at [facility], [address] [place], [place] [address]  Magnesium     Status: Abnormal   Collection Time: 10/22/17  4:24 AM  Result Value Ref Range   Magnesium 1.1 (L) 1.7 - 2.4 mg/dL    Comment:  Performed at [facility], [address] [place], [place] [address]  Glucose, capillary     Status: Abnormal   Collection Time: 10/22/17  7:26 AM  Result Value Ref Range   Glucose-Capillary 138 (H) 70 - 99 mg/dL  Sodium     Status: Abnormal   Collection Time: 10/22/17  9:59 AM  Result Value Ref Range   Sodium 129 (L)  135 - 145 mmol/L    Comment: Performed at [facility], [address] [place], [place] [address]  Glucose, capillary     Status: Abnormal   Collection Time: 10/22/17 11:40 AM  Result Value Ref Range   Glucose-Capillary 190 (H) 70 - 99 mg/dL  Ammonia     Status: Abnormal   Collection Time: 10/22/17 12:53 PM  Result Value Ref Range   Ammonia 40 (H) 9 - 35 umol/L    Comment: Performed at [facility], [address] [place], [place] [address]  Sodium     Status: Abnormal   Collection Time: 10/22/17  4:18 PM  Result Value Ref Range   Sodium 129 (L) 135 - 145 mmol/L    Comment: Performed at [facility], [address] [place], [place] [address]  Thyroid Panel With TSH     Status: Abnormal   Collection Time: 10/23/17  1:21 PM  Result Value Ref Range   TSH 11.450 (H) 0.450 - 4.500 uIU/mL   T4, Total 4.7 4.5 - 12.0 ug/dL   T3 Uptake Ratio 30 24 - 39 %   Free Thyroxine Index 1.4 1.2 - 4.9    Comment: (NOTE) Performed At: [facility] [place], [place] [id number] Rush Farmer MD [id number]   Comprehensive metabolic panel     Status: Abnormal   Collection Time: 10/23/17  1:21 PM  Result Value Ref Range   Sodium 128 (L) 135 - 145 mmol/L   Potassium 4.5 3.5 - 5.1 mmol/L   Chloride 89 (L) 98 - 111 mmol/L   CO2 30 22 - 32 mmol/L   Glucose, Bld 234 (H) 70 - 99 mg/dL   BUN 16 8 - 23 mg/dL   Creatinine, Ser 1.06 0.61 - 1.24 mg/dL   Calcium 8.1 (L) 8.9 - 10.3 mg/dL   Total Protein 6.8 6.5 - 8.1 g/dL   Albumin 3.3 (L) 3.5 - 5.0 g/dL   AST 64 (H) 15 - 41 U/L   ALT 25 0 - 44 U/L   Alkaline Phosphatase  264 (H) 38 - 126 U/L   Total Bilirubin 3.7 (H) 0.3 - 1.2 mg/dL   GFR calc non Af Amer >60 >60 mL/min   GFR calc Af Amer >60 >60 mL/min    Comment: (NOTE) The eGFR has been calculated using the CKD EPI equation. This calculation has not been validated in all clinical situations. eGFR's persistently <60 mL/min signify possible Chronic Kidney Disease.    Anion gap 9 5 - 15    Comment: Performed at [facility], [place], [place] [address]  CBC with Differential/Platelet     Status: Abnormal   Collection Time: 10/23/17  1:25 PM  Result Value Ref Range   WBC 3.9 (L) 4.0 - 10.5 K/uL   RBC 3.27 (L) 4.22 - 5.81 MIL/uL   Hemoglobin 9.2 (L) 13.0 - 17.0 g/dL   HCT 28.1 (L) 39.0 - 52.0 %   MCV 85.9 80.0 - 100.0 fL   MCH 28.1 26.0 - 34.0 pg   MCHC 32.7 30.0 - 36.0 g/dL   RDW 18.0 (H) 11.5 - 15.5 %   Platelets 95 (L) 150 - 400 K/uL   nRBC 0.0 0.0 - 0.2 %   Neutrophils Relative % 79 %   Neutro Abs 3.1 1.7 - 7.7 K/uL   Lymphocytes Relative 10 %   Lymphs Abs 0.4 (L) 0.7 - 4.0 K/uL   Monocytes Relative 9 %   Monocytes  Absolute 0.4 0.1 - 1.0 K/uL   Eosinophils Relative 1 %   Eosinophils Absolute 0.0 0.0 - 0.5 K/uL   Basophils Relative 1 %   Basophils Absolute 0.0 0.0 - 0.1 K/uL   Immature Granulocytes 0 %   Abs Immature Granulocytes 0.01 0.00 - 0.07 K/uL    Comment: Performed at [facility], [address] [place], [place] [address]   Objective  Body mass index is 21.9 kg/m. Wt Readings from Last 3 Encounters:  11/05/17 157 lb (71.2 kg)  11/05/17 157 lb (71.2 kg)  10/25/17 152 lb 9.6 oz (69.2 kg)   Temp Readings from Last 3 Encounters:  11/05/17 98.4 F (36.9 C) (Oral)  11/05/17 98.4 F (36.9 C) (Oral)  10/25/17 99.2 F (37.3 C) (Oral)   BP Readings from Last 3 Encounters:  11/05/17 130/70  11/05/17 130/70  10/25/17 (!) 130/56   Pulse Readings from Last 3 Encounters:  11/05/17 (!) 104  11/05/17 (!) 104  10/25/17 (!) 105    Physical Exam   Constitutional: He is oriented to person, place, and time. Vital signs are normal. He appears well-developed and well-nourished. He is cooperative.  HENT:  Head: Normocephalic and atraumatic.  Mouth/Throat: Oropharynx is clear and moist and mucous membranes are normal.  Eyes: Pupils are equal, round, and reactive to light. Conjunctivae are normal.  Cardiovascular: Normal rate, regular rhythm and normal heart sounds.  Pulmonary/Chest: Effort normal and breath sounds normal.  Abdominal: He exhibits distension. There is no tenderness. A hernia is present.  Neurological: He is alert and oriented to person, place, and time. Gait normal.  Skin: Skin is warm, dry and intact.  Psychiatric: He has a normal mood and affect. His speech is normal and behavior is normal. Judgment and thought content normal. Cognition and memory are normal.  Nursing note and vitals reviewed.   Assessment   1. Altered mental status could be multifactorial hepatic encephalopathy with h/o elevated ammonia 40 10/22/17, hyponatremia 2/2 liver cirrhosis, h/o hypoglycemia, hypothyriodism and elevated TSH though this is trending down vs medication related xanax and oxycodone,. R/o DKA h/o ketones in urine 2. DM 2 A1C 09/23/17 with hypo/hyperglycemia 3. Left big toe with ecchymosis and resolved blister and cellulitis changes and abrasion right back  4. Hypothyroidism  5. Severe liver cirrhosis with MELD 25 and recurrent ascites, hyponatremia  6. H/o metastatic renal cell cancer to lung  7. HM Plan   1. Check labs today CMET, ammonia, BHB, TSH, UA  Disc with GI dose of lactulose was on 45 mg qd  See #5  Monitor  appt with renal 11/01/17 Dr. Holley Raring  2. levemir was on 37 units qhs change to 18 units bid and SSI-S novolog using 6-8 units bid rec always use with food  Caution due to liver cirrhosis may make prone to hypoglycemia  3. Doxycycline bid x 10 days, bactroban  4. Levo 50 monitor TSH check today  5. Pt and family want  second opinion [facility] liver failure/hepatology radiology in [place] didn't want to do TIPS until Na reached 135 currently 125  Prn lasix  Could not tolerate spironolactone due to hyperK  F/u Dr. Jonathon Bellows GI locally  6.  Doing Nicolumab infusion 11/06/17 f/u Dr. Alyssa Grove f/u in 1 month  7.  Had flu shot 09/16/17  Tdaputd  pna 23 01/28/15 and prevnar 11/30/13  Disc shingrix in future   TSH need to monitor will not treat for nowconsider thyroid US Former smoker PSA neg5/8/19 Colonoscopy  get records [facility] in [place] release signed though never received   [facility] urology saw 11/12/17 no surgery rec. rec supportive underwear and scrotal elevation for b/l painful hydroceles L>R rec optimize cirrhosis before surgery considered f/u prn Dr. Eulogio Bear    Provider: Dr. Olivia Mackie [name]-Internal Medicine

## 2017-11-05 NOTE — Patient Instructions (Addendum)
18 units in am and 18 units in the PM of levemir  Still follow sliding scale insulin if blood sugar then given units to correspond  131-180 2 units, 181-240 4 units, 241-300 6 units, 301-350 8 units, 351-400 10 units, >400 12 units          Confusion Confusion is the inability to think with your usual speed or clarity. Confusion may come on quickly or slowly over time. How quickly the confusion comes on depends on the cause. Confusion can be due to any number of causes. What are the causes?  Concussion, head injury, or head trauma.  Seizures.  Stroke.  Fever.  Brain tumor.  Age related decreased brain function (dementia).  Heightened emotional states like rage or terror.  Mental illness in which the person loses the ability to determine what is real and what is not (hallucinations).  Infections such as a urinary tract infection (UTI).  Toxic effects from alcohol, drugs, or prescription medicines.  Dehydration and an imbalance of salts in the body (electrolytes).  Lack of sleep.  Low blood sugar (diabetes).  Low levels of oxygen from conditions such as chronic lung disorders.  Drug interactions or other medicine side effects.  Nutritional deficiencies, especially niacin, thiamine, vitamin C, or vitamin B.  Sudden drop in body temperature (hypothermia).  Change in routine, such as when traveling or hospitalized. What are the signs or symptoms? People often describe their thinking as cloudy or unclear when they are confused. Confusion can also include feeling disoriented. That means you are unaware of where or who you are. You may also not know what the date or time is. If confused, you may also have difficulty paying attention, remembering, and making decisions. Some people also act aggressively when they are confused. How is this diagnosed? The medical evaluation of confusion may include:  Blood and urine tests.  X-rays.  Brain and nervous system  tests.  Analyzing your brain waves (electroencephalogram or EEG).  Magnetic resonance imaging (MRI) of your head.  Computed tomography (CT) scan of your head.  Mental status tests in which your health care provider may ask many questions. Some of these questions may seem silly or strange, but they are a very important test to help diagnose and treat confusion.  How is this treated? An admission to the hospital may not be needed, but a person with confusion should not be left alone. Stay with a family member or friend until the confusion clears. Avoid alcohol, pain relievers, or sedative drugs until you have fully recovered. Do not drive until directed by your health care provider. Follow these instructions at home: What family and friends can do:  To find out if someone is confused, ask the person to state his or her name, age, and the date. If the person is unsure or answers incorrectly, he or she is confused.  Always introduce yourself, no matter how well the person knows you.  Often remind the person of his or her location.  Place a calendar and clock near the confused person.  Help the person with his or her medicines. You may want to use a pill box, an alarm as a reminder, or give the person each dose as prescribed.  Talk about current events and plans for the day.  Try to keep the environment calm, quiet, and peaceful.  Make sure the person keeps follow-up visits with his or her health care provider.  How is this prevented? Ways to prevent confusion:  Avoid  alcohol.  Eat a balanced diet.  Get enough sleep.  Take medicine only as directed by your health care provider.  Do not become isolated. Spend time with other people and make plans for your days.  Keep careful watch on your blood sugar levels if you are diabetic.  Get help right away if:  You develop severe headaches, repeated vomiting, seizures, blackouts, or slurred speech.  There is increasing confusion,  weakness, numbness, restlessness, or personality changes.  You develop a loss of balance, have marked dizziness, feel uncoordinated, or fall.  You have delusions, hallucinations, or develop severe anxiety.  Your family members think you need to be rechecked. This information is not intended to replace advice given to you by your health care provider. Make sure you discuss any questions you have with your health care provider. Document Released: 01/26/2004 Document Revised: 07/08/2015 Document Reviewed: 01/23/2013 Elsevier Interactive Patient Education  2018 [employer].    Liver Failure Liver failure is a condition in which the liver loses its ability to function due to injury or disease. The liver is a large organ in the upper right-hand side of the abdomen. It is involved in many important functions, including storing energy, producing fluids that the body needs, and removing harmful substances from the bloodstream. Liver failure can develop quickly, over days or weeks (acute liver failure). It can also develop gradually, over months or years (chronicliver failure). What are the causes? There are many possible causes of liver failure, including:  Liver infection (viral hepatitis).  Alcohol abuse.  An overdose of certain medicines. Acetaminophen overdose is a common cause.  Liver disease.  Ingesting poison from mushrooms or mold.  What are the signs or symptoms? Symptoms of this condition may include:  Yellowing of the skin and the whites of the eyes (jaundice).  Bruising easily.  Persistent bleeding.  Itchy skin (pruritus).  Red, spider-like lines on the skin.  Loss of appetite.  Nausea.  Vomiting blood.  Bloody bowel movements.  Weight loss.  Muscle loss.  Jerky or floppy muscle movements, especially with hand movements (asterixis).  Fluid buildup in the belly (ascites).  Decreased urination. This may be a sign that your kidneys have stopped working  (renal failure).  Confusion and sleepiness.  Difficulty sleeping.  Mood and personality changes.  Frequent infections.  Breath that smells sweet or musty.  Difficulty breathing.  How is this diagnosed? This condition is diagnosed based on your symptoms, your medical history, and a physical exam. You may have tests, including:  Blood tests.  CT scan.  MRI.  Ultrasound.  Removal of a small amount of liver tissue to be examined under a microscope (biopsy).  How is this treated? Treatment depends on the cause and severity of your condition. Treatment for chronic liver failure may include:  Medicines to help reduce symptoms.  Lifestyle changes, such as limiting salt and animal proteins in your diet. Foods that contain animal proteins include red meat, fish, and dairy products.  Acute or advanced (end stage) liver failure may require hospitalization. Treatment may include:  Antibiotic medicine.  IV fluids that contain sugar (glucose) and minerals (electrolytes).  Flushing out toxic substances from the body using medicine (lactulose) or a cleansing procedure (enema).  Adding certain amounts of the liquid part of blood (plasma) to your bloodstream (receiving a transfusion).  Using an artificial kidney to filter your blood (hemodialysis) if you have renal failure.  Breathing support and a breathing tube (respirator).  Liver transplant. This is a  surgery to replace your liver with another person's liver (donor liver). This may be the best option if your liver has completely stopped functioning.  Follow these instructions at home:  Take over-the-counter and prescription medicines only as told by your health care provider.  Do not drink alcohol.  Do not use tobacco products, including cigarettes, chewing tobacco, or e-cigarettes. If you need help quitting, ask your health care provider.  Follow instructions from your health care provider about eating and drinking  restrictions. This may include: ? Limiting the amount of animal protein that you eat. ? Increasing the amount of plant-based protein that you eat. Foods that contain plant-based proteins include whole grains, nuts, and vegetables. ? Taking vitamin supplements. ? Limiting the amount of salt that you eat.  Follow instructions from your health care provider about maintaining your vaccinations, especially vaccinations against hepatitis A and B.  Exercise regularly, as told by your health care provider.  Keep all follow-up visits as told by your health care provider. This is important. Contact a health care provider if:  You have symptoms that get worse.  You lose a lot of weight without trying.  You have a fever or chills. Get help right away if:  You become confused or very sleepy.  You cannot take care of yourself or be taken care of at home.  You are not urinating.  You have difficulty breathing.  You vomit blood. This information is not intended to replace advice given to you by your health care provider. Make sure you discuss any questions you have with your health care provider. Document Released: 09/08/2014 Document Revised: 05/26/2015 Document Reviewed: 01/06/2014 Elsevier Interactive Patient Education  [employer].

## 2017-11-05 NOTE — Patient Instructions (Addendum)
  Mr. Raver , Thank you for taking time to come for your Medicare Wellness Visit. I appreciate your ongoing commitment to your health goals. Please review the following plan we discussed and let me know if I can assist you in the future.   Follow up as needed.    Bring a copy of your Julian and/or Living Will to be scanned into chart.  Have a great day!  These are the goals we discussed: Goals      Patient Stated   . Healthy Lifestyle (pt-stated)     Physical therapy exercises twice weekly Control blood sugars  Boost/Ensure supplement drink daily        This is a list of the screening recommended for you and due dates:  Health Maintenance  Topic Date Due  . Complete foot exam   07/09/1966  . Eye exam for diabetics  07/09/1966  . Colon Cancer Screening  07/09/2006  . Urine Protein Check  08/28/2017  . Hemoglobin A1C  03/24/2018  . Tetanus Vaccine  09/24/2027  . Flu Shot  Completed  . Pneumococcal vaccine  Completed  .  Hepatitis C: One time screening is recommended by Center for Disease Control  (CDC) for  adults born from [birth year] through 1964.   Completed  . HIV Screening  Completed

## 2017-11-06 ENCOUNTER — Telehealth: Payer: Self-pay

## 2017-11-06 ENCOUNTER — Encounter: Payer: Self-pay | Admitting: Oncology

## 2017-11-06 ENCOUNTER — Inpatient Hospital Stay: Payer: Medicare HMO

## 2017-11-06 ENCOUNTER — Inpatient Hospital Stay: Payer: Medicare HMO | Attending: Oncology

## 2017-11-06 ENCOUNTER — Telehealth: Payer: Self-pay | Admitting: Internal Medicine

## 2017-11-06 ENCOUNTER — Other Ambulatory Visit: Payer: Self-pay

## 2017-11-06 ENCOUNTER — Inpatient Hospital Stay (HOSPITAL_BASED_OUTPATIENT_CLINIC_OR_DEPARTMENT_OTHER): Payer: Medicare HMO | Admitting: Oncology

## 2017-11-06 ENCOUNTER — Inpatient Hospital Stay (HOSPITAL_BASED_OUTPATIENT_CLINIC_OR_DEPARTMENT_OTHER): Payer: Medicare HMO | Admitting: Hospice and Palliative Medicine

## 2017-11-06 ENCOUNTER — Encounter: Payer: Self-pay | Admitting: Internal Medicine

## 2017-11-06 VITALS — BP 127/79 | HR 104 | Temp 98.0°F | Resp 18 | Wt 157.0 lb

## 2017-11-06 DIAGNOSIS — R188 Other ascites: Secondary | ICD-10-CM

## 2017-11-06 DIAGNOSIS — Z87891 Personal history of nicotine dependence: Secondary | ICD-10-CM

## 2017-11-06 DIAGNOSIS — Z66 Do not resuscitate: Secondary | ICD-10-CM | POA: Diagnosis not present

## 2017-11-06 DIAGNOSIS — K7460 Unspecified cirrhosis of liver: Secondary | ICD-10-CM

## 2017-11-06 DIAGNOSIS — C649 Malignant neoplasm of unspecified kidney, except renal pelvis: Secondary | ICD-10-CM

## 2017-11-06 DIAGNOSIS — C642 Malignant neoplasm of left kidney, except renal pelvis: Secondary | ICD-10-CM

## 2017-11-06 DIAGNOSIS — Z794 Long term (current) use of insulin: Secondary | ICD-10-CM | POA: Diagnosis not present

## 2017-11-06 DIAGNOSIS — M109 Gout, unspecified: Secondary | ICD-10-CM | POA: Diagnosis not present

## 2017-11-06 DIAGNOSIS — C7802 Secondary malignant neoplasm of left lung: Secondary | ICD-10-CM

## 2017-11-06 DIAGNOSIS — Z79899 Other long term (current) drug therapy: Secondary | ICD-10-CM

## 2017-11-06 DIAGNOSIS — Z5112 Encounter for antineoplastic immunotherapy: Secondary | ICD-10-CM | POA: Diagnosis not present

## 2017-11-06 DIAGNOSIS — Z7982 Long term (current) use of aspirin: Secondary | ICD-10-CM | POA: Diagnosis not present

## 2017-11-06 DIAGNOSIS — C7800 Secondary malignant neoplasm of unspecified lung: Secondary | ICD-10-CM

## 2017-11-06 DIAGNOSIS — I129 Hypertensive chronic kidney disease with stage 1 through stage 4 chronic kidney disease, or unspecified chronic kidney disease: Secondary | ICD-10-CM | POA: Diagnosis not present

## 2017-11-06 DIAGNOSIS — Z905 Acquired absence of kidney: Secondary | ICD-10-CM | POA: Diagnosis not present

## 2017-11-06 DIAGNOSIS — E1122 Type 2 diabetes mellitus with diabetic chronic kidney disease: Secondary | ICD-10-CM | POA: Diagnosis not present

## 2017-11-06 DIAGNOSIS — E871 Hypo-osmolality and hyponatremia: Secondary | ICD-10-CM | POA: Insufficient documentation

## 2017-11-06 DIAGNOSIS — D61818 Other pancytopenia: Secondary | ICD-10-CM | POA: Insufficient documentation

## 2017-11-06 DIAGNOSIS — R4182 Altered mental status, unspecified: Secondary | ICD-10-CM | POA: Insufficient documentation

## 2017-11-06 DIAGNOSIS — R531 Weakness: Secondary | ICD-10-CM

## 2017-11-06 DIAGNOSIS — N183 Chronic kidney disease, stage 3 (moderate): Secondary | ICD-10-CM | POA: Diagnosis not present

## 2017-11-06 DIAGNOSIS — K219 Gastro-esophageal reflux disease without esophagitis: Secondary | ICD-10-CM | POA: Insufficient documentation

## 2017-11-06 DIAGNOSIS — Z515 Encounter for palliative care: Secondary | ICD-10-CM | POA: Diagnosis not present

## 2017-11-06 DIAGNOSIS — C7801 Secondary malignant neoplasm of right lung: Secondary | ICD-10-CM

## 2017-11-06 DIAGNOSIS — J90 Pleural effusion, not elsewhere classified: Secondary | ICD-10-CM | POA: Diagnosis not present

## 2017-11-06 DIAGNOSIS — R410 Disorientation, unspecified: Secondary | ICD-10-CM | POA: Insufficient documentation

## 2017-11-06 DIAGNOSIS — E875 Hyperkalemia: Secondary | ICD-10-CM | POA: Insufficient documentation

## 2017-11-06 DIAGNOSIS — E1142 Type 2 diabetes mellitus with diabetic polyneuropathy: Secondary | ICD-10-CM | POA: Insufficient documentation

## 2017-11-06 LAB — CBC WITH DIFFERENTIAL/PLATELET
ABS IMMATURE GRANULOCYTES: 0.01 10*3/uL (ref 0.00–0.07)
BASOS PCT: 0 %
Basophils Absolute: 0 10*3/uL (ref 0.0–0.1)
Eosinophils Absolute: 0.1 10*3/uL (ref 0.0–0.5)
Eosinophils Relative: 3 %
HEMATOCRIT: 24.7 % — AB (ref 39.0–52.0)
Hemoglobin: 8 g/dL — ABNORMAL LOW (ref 13.0–17.0)
Immature Granulocytes: 0 %
LYMPHS ABS: 0.4 10*3/uL — AB (ref 0.7–4.0)
Lymphocytes Relative: 14 %
MCH: 28.2 pg (ref 26.0–34.0)
MCHC: 32.4 g/dL (ref 30.0–36.0)
MCV: 87 fL (ref 80.0–100.0)
MONOS PCT: 10 %
Monocytes Absolute: 0.3 10*3/uL (ref 0.1–1.0)
NEUTROS ABS: 2.2 10*3/uL (ref 1.7–7.7)
Neutrophils Relative %: 73 %
PLATELETS: 140 10*3/uL — AB (ref 150–400)
RBC: 2.84 MIL/uL — ABNORMAL LOW (ref 4.22–5.81)
RDW: 16.1 % — ABNORMAL HIGH (ref 11.5–15.5)
WBC: 3 10*3/uL — ABNORMAL LOW (ref 4.0–10.5)
nRBC: 0 % (ref 0.0–0.2)

## 2017-11-06 LAB — COMPREHENSIVE METABOLIC PANEL
ALBUMIN: 2.7 g/dL — AB (ref 3.5–5.0)
ALT: 16 U/L (ref 0–44)
AST: 30 U/L (ref 15–41)
Alkaline Phosphatase: 252 U/L — ABNORMAL HIGH (ref 38–126)
Anion gap: 8 (ref 5–15)
BILIRUBIN TOTAL: 2.5 mg/dL — AB (ref 0.3–1.2)
BUN: 50 mg/dL — AB (ref 8–23)
CO2: 28 mmol/L (ref 22–32)
Calcium: 8.7 mg/dL — ABNORMAL LOW (ref 8.9–10.3)
Chloride: 90 mmol/L — ABNORMAL LOW (ref 98–111)
Creatinine, Ser: 1.83 mg/dL — ABNORMAL HIGH (ref 0.61–1.24)
GFR calc Af Amer: 44 mL/min — ABNORMAL LOW (ref >60)
GFR calc non Af Amer: 38 mL/min — ABNORMAL LOW (ref >60)
GLUCOSE: 105 mg/dL — AB (ref 70–99)
Potassium: 3.5 mmol/L (ref 3.5–5.1)
Sodium: 126 mmol/L — ABNORMAL LOW (ref 135–145)
TOTAL PROTEIN: 7.1 g/dL (ref 6.5–8.1)

## 2017-11-06 LAB — URINALYSIS, ROUTINE W REFLEX MICROSCOPIC
Bilirubin Urine: NEGATIVE
Hgb urine dipstick: NEGATIVE
Ketones, ur: NEGATIVE
Leukocytes, UA: NEGATIVE
NITRITE: NEGATIVE
Protein, ur: NEGATIVE
SPECIFIC GRAVITY, URINE: 1.012 (ref 1.001–1.03)

## 2017-11-06 MED ORDER — SODIUM CHLORIDE 0.9 % IV SOLN
Freq: Once | INTRAVENOUS | Status: AC
  Administered 2017-11-06: 12:00:00 via INTRAVENOUS
  Filled 2017-11-06: qty 250

## 2017-11-06 MED ORDER — LACTULOSE 10 GM/15ML PO SOLN: 30.0000 g | Freq: Every day | ORAL | 2 refills | Status: DC

## 2017-11-06 MED ORDER — SODIUM CHLORIDE 0.9 % IV SOLN
240.0000 mg | Freq: Once | INTRAVENOUS | Status: AC
  Administered 2017-11-06: 240 mg via INTRAVENOUS
  Filled 2017-11-06: qty 24

## 2017-11-06 NOTE — Progress Notes (Signed)
   Palliative Medicine [facility]  [phone] [phone]   Name: Rodney Stewart Date: 11/06/2017 MRN: [id number]  DOB: 11/19/1955  Patient Care Team: [name], Tracy N, MD as PCP - General (Internal Medicine)    REASON FOR CONSULTATION: Palliative Care consult requested for this 62 y.o. male with multiple medical problems including stage IV renal cell carcinoma status post left nephrectomy (May 2014) metastatic to lung, advanced decompensated cirrhosis with recurrent ascites, CKD 3, who was recently hospitalized 10/18/2017 to 10/20/2017 with hepatorenal syndrome.  Patient's RCC is being managed with second line nivolumab.  Palliative care was consulted to help establish health care.  SOCIAL HISTORY:    Patient is divorced.  Lives at home alone.  Has 2 daughters who are involved in his care.  Patient used to work and textiles.  ADVANCE DIRECTIVES:  Does not have  CODE STATUS: DNR  PAST MEDICAL HISTORY: Past Medical History:  Diagnosis Date  . Anemia   . Ascites   . BPH (benign prostatic hyperplasia)   . Cirrhosis (HCC)   . CKD (chronic kidney disease) stage 3, GFR 30-59 ml/min (HCC)   . Clear cell renal cell carcinoma (HCC) 2014   Left Nephrectomy.  . Colon polyps   . Diabetes mellitus without complication (HCC)    type 2   . Dyspnea    with exertion  . GERD (gastroesophageal reflux disease)   . History of gout   . History of nephrectomy    Left  . Hypertension   . Neuropathy   . Renal Cancer    Renal Cancer  . Renal cell carcinoma of left kidney (HCC)    mets to lungs  . Umbilical hernia     PAST SURGICAL HISTORY:  Past Surgical History:  Procedure Laterality Date  . BACK SURGERY     ruptured disc 1991   . ENDOBRONCHIAL ULTRASOUND N/A 10/14/2015   Procedure: ENDOBRONCHIAL ULTRASOUND;  Surgeon: Pradeep Ramachandran, MD;  Location: [facility] ORS;  Service: Pulmonary;  Laterality: N/A;  . ESOPHAGOGASTRODUODENOSCOPY  (EGD) WITH PROPOFOL N/A 04/15/2017   Procedure: ESOPHAGOGASTRODUODENOSCOPY (EGD) WITH PROPOFOL;  Surgeon: Anna, Kiran, MD;  Location: [facility] ENDOSCOPY;  Service: Gastroenterology;  Laterality: N/A;  Screen for esophageal varices  . EYE SURGERY Bilateral    Cataract Extraction with IOL  . IR RADIOLOGIST EVAL & MGMT  10/15/2017  . KNEE SURGERY Right   . NEPHRECTOMY     left kidney 2014 renal cell cancer     HEMATOLOGY/ONCOLOGY HISTORY:  Oncology History   Patient underwent left nephrectomy on May 16, 2012 which revealed a clear cell grade 2 renal cell carcinoma, stage TIIIa, N0, M0. Tumor size of 16 cm. Patient was noted to have renal vein involvement, but no other structures were involved. 0 of 2 lymph nodes were negative for disease. PET scan on October 20, 2015 revealed metastatic disease and patient was initiated on Votrient. This was subsequently discontinued in March 2018 secondary to progression of disease. Patient initiated second line treatment with nivolumab on Monday, April 02, 2016     Metastatic renal cell carcinoma to lung (HCC)    Initial Diagnosis    Metastatic renal cell carcinoma to lung (HCC)     ALLERGIES:  has No Known Allergies.  MEDICATIONS:  Current Outpatient Medications  Medication Sig Dispense Refill  . allopurinol (ZYLOPRIM) 100 MG tablet Take 1 tablet (100 mg total) by mouth daily. 90 tablet 3  . ALPRAZolam (XANAX) 1 MG tablet Take 1 mg   by mouth 2 (two) times daily as needed for anxiety or sleep.     . aspirin EC 81 MG tablet Take 81 mg by mouth daily.    . doxycycline (VIBRA-TABS) 100 MG tablet Take 1 tablet (100 mg total) by mouth 2 (two) times daily. With food 20 tablet 0  . feeding supplement, ENSURE ENLIVE, (ENSURE ENLIVE) LIQD Take 237 mLs by mouth 3 (three) times daily between meals. 237 mL 12  . furosemide (LASIX) 20 MG tablet Take 2 tablets (40 mg total) by mouth daily. In am 30 tablet 0  . gabapentin (NEURONTIN) 100 MG capsule Take 2 capsules (200 mg  total) by mouth 3 (three) times daily. (Patient taking differently: Take 200 mg by mouth 2 (two) times daily. ) 360 capsule 5  . insulin aspart (NOVOLOG) 100 UNIT/ML FlexPen Before meals 131-180 2 units, 181-240 4 units, 241-300 6 units, 301-350 8 units, 351-400 10 units, >400 12 units 15 mL 11  . Insulin Detemir (LEVEMIR FLEXTOUCH) 100 UNIT/ML Pen Inject 18 Units into the skin 2 (two) times daily. With food 12 pen 12  . Insulin Pen Needle 32G X 4 MM MISC 1 Device by Does not apply route 2 (two) times daily. E11.9 200 each 12  . lactulose (CHRONULAC) 10 GM/15ML solution Take 45 mLs (30 g total) by mouth daily. 1800 mL 2  . levothyroxine (SYNTHROID, LEVOTHROID) 50 MCG tablet Take 1 tablet (50 mcg total) by mouth daily before breakfast. 30 minutes 90 tablet 0  . lovastatin (MEVACOR) 20 MG tablet Take 1 tablet (20 mg total) by mouth daily at 6 PM. 90 tablet 3  . magic mouthwash w/lidocaine SOLN Take 5 mLs by mouth 3 (three) times daily as needed for mouth pain. Swish and spit 250 mL 0  . mupirocin ointment (BACTROBAN) 2 % Apply 1 application topically 3 (three) times daily. Left big toe and right back 30 g 1  . ONE TOUCH ULTRA TEST test strip Bid 180 each 3  . oxyCODONE (ROXICODONE) 5 MG immediate release tablet Take 1 tablet (5 mg total) by mouth every 6 (six) hours as needed for severe pain or breakthrough pain. 45 tablet 0  . potassium chloride SA (K-DUR,KLOR-CON) 20 MEQ tablet Take 1 tablet (20 mEq total) by mouth daily for 3 days. 3 tablet 0  . tamsulosin (FLOMAX) 0.4 MG CAPS capsule Take 1 capsule (0.4 mg total) by mouth every other day. With supper 90 capsule 3  . valACYclovir (VALTREX) 1000 MG tablet Take 1 tablet (1,000 mg total) by mouth 2 (two) times daily. X 5-10 days with food 30 tablet 5   No current facility-administered medications for this visit.     VITAL SIGNS: There were no vitals taken for this visit. There were no vitals filed for this visit.  Estimated body mass index is 21.9  kg/m as calculated from the following:   Height as of 11/05/17: 5' 11" (1.803 m).   Weight as of an earlier encounter on 11/06/17: 157 lb (71.2 kg).  LABS: CBC:    Component Value Date/Time   WBC 3.0 (L) 11/06/2017 0928   HGB 8.0 (L) 11/06/2017 0928   HGB 8.2 (L) 04/28/2012 1503   HCT 24.7 (L) 11/06/2017 0928   HCT 26.1 (L) 04/28/2012 1503   PLT 140 (L) 11/06/2017 0928   PLT 357 04/28/2012 1503   MCV 87.0 11/06/2017 0928   MCV 69 (L) 04/28/2012 1503   NEUTROABS 2.2 11/06/2017 0928   NEUTROABS 4.1 04/28/2012 1503     LYMPHSABS 0.4 (L) 11/06/2017 0928   LYMPHSABS 0.7 (L) 04/28/2012 1503   MONOABS 0.3 11/06/2017 0928   MONOABS 0.6 04/28/2012 1503   EOSABS 0.1 11/06/2017 0928   EOSABS 0.1 04/28/2012 1503   BASOSABS 0.0 11/06/2017 0928   BASOSABS 0.0 04/28/2012 1503   Comprehensive Metabolic Panel:    Component Value Date/Time   NA 126 (L) 11/06/2017 0928   NA 121 (L) 09/24/2017 1458   NA 135 (L) 04/28/2012 1503   K 3.5 11/06/2017 0928   K 5.0 04/28/2012 1503   CL 90 (L) 11/06/2017 0928   CL 101 04/28/2012 1503   CO2 28 11/06/2017 0928   CO2 26 04/28/2012 1503   BUN 50 (H) 11/06/2017 0928   BUN 37 (H) 09/24/2017 1458   BUN 13 04/28/2012 1503   CREATININE 1.83 (H) 11/06/2017 0928   CREATININE 1.49 (H) 04/28/2012 1503   GLUCOSE 105 (H) 11/06/2017 0928   GLUCOSE 239 (H) 04/28/2012 1503   CALCIUM 8.7 (L) 11/06/2017 0928   CALCIUM 8.2 (L) 02/19/2017 1522   AST 30 11/06/2017 0928   AST 25 04/28/2012 1503   ALT 16 11/06/2017 0928   ALT 18 04/28/2012 1503   ALKPHOS 252 (H) 11/06/2017 0928   ALKPHOS 181 (H) 04/28/2012 1503   BILITOT 2.5 (H) 11/06/2017 0928   BILITOT 3.9 (H) 09/23/2017 1336   BILITOT 0.8 04/28/2012 1503   PROT 7.1 11/06/2017 0928   PROT 5.6 (L) 09/23/2017 1336   PROT 8.4 (H) 04/28/2012 1503   ALBUMIN 2.7 (L) 11/06/2017 0928   ALBUMIN 2.8 (L) 09/23/2017 1336   ALBUMIN 3.1 (L) 04/28/2012 1503    RADIOGRAPHIC STUDIES: Dg Chest 2 View  Result Date:  10/18/2017 CLINICAL DATA:  Shortness of breath over the past 6 months, worse over the past 2 days. History of metastatic renal cell carcinoma EXAM: CHEST - 2 VIEW COMPARISON:  CT chest dated August 30, 2017. Chest x-ray dated January 25, 2017. FINDINGS: Normal heart size. Fullness of both hilar regions is unchanged. Small bilateral pleural effusions. No consolidation or pneumothorax. No acute osseous abnormality. IMPRESSION: 1. New small bilateral pleural effusions. 2. Unchanged bilateral hilar fullness corresponding to known hilar lymphadenopathy. Electronically Signed   By: Titus Dubin M.D.   On: 10/18/2017 15:24   US Paracentesis  Result Date: 10/21/2017 INDICATION: Recurrent symptomatic intra-abdominal ascites. Please perform ultrasound-guided paracentesis for therapeutic purposes as indicated. Paracentesis is limited at 5 L. EXAM: ULTRASOUND-GUIDED PARACENTESIS COMPARISON:  CT abdomen pelvis-09/20/2017; multiple previous ultrasound-guided paracenteses, most recently 09/16/2017 yielding 2 L of ascitic fluid. MEDICATIONS: None. COMPLICATIONS: None immediate. TECHNIQUE: Informed written consent was obtained from the patient after a discussion of the risks, benefits and alternatives to treatment. A timeout was performed prior to the initiation of the procedure. Initial ultrasound scanning demonstrates a large amount of ascites within the right lower abdominal quadrant. The right lower abdomen was prepped and draped in the usual sterile fashion. 1% lidocaine with epinephrine was used for local anesthesia. An ultrasound image was saved for documentation purposed. An 8 Fr Safe-T-Centesis catheter was introduced. The paracentesis was performed. The catheter was removed and a dressing was applied. The patient tolerated the procedure well without immediate post procedural complication. FINDINGS: A total of approximately 5 liters of serous fluid was removed. IMPRESSION: Successful ultrasound-guided paracentesis  yielding 5 liters of peritoneal fluid. Electronically Signed   By: Sandi Mariscal M.D.   On: 10/21/2017 10:50   Ir Radiologist Eval & Mgmt  Result Date: 10/15/2017 Please refer to  notes tab for details about interventional procedure. (Op Note)   PERFORMANCE STATUS (ECOG) : 2 - Symptomatic, <50% confined to bed  Review of Systems As noted above. Otherwise, a complete review of systems is negative.  Physical Exam General: NAD, frail appearing, thin Pulmonary: Unlabored Abdomen: Distended, positive fluid wave, umbilical hernia GU: no suprapubic tenderness Extremities: no edema, no joint deformities Skin: no rashes Neurological: Weakness but otherwise nonfocal  IMPRESSION: I met with patient and his daughter today in the clinic.  Patient says he recognizes the advanced stage of his illness and that he is at high risk for future decline and/or death.  Patient's decompensated cirrhosis seems to be his most significant problem at the present time.  Per my calculation, he is a Child-Pugh class C with a MELD-Na score of 27.  His risk of mortality in 3 months is predicted to be 19.6%.  His risk of mortality in 1 year is predicted to be 45%.  Patient's current goals seem aligned with treatment.  He has been evaluated for consideration of TIPS but his hyponatremia has thus far excluded that option.  Patient is requiring recurrent paracentesis, which was last done 10/21/2017 with 5 L of ascitic fluid removed.  Consideration could be given for insertion of a Tenckhoff catheter in the future if patient is requiring paracentesis with increasing frequency.  Symptomatically, patient denies any distressing symptoms today.  He says pain is well controlled.  Functionally, patient says he lives at home independently.  He reports being able to care for himself.  He denies falls.  He still drives in his car.  He says he has no issues obtaining food.  We discussed advance directives.  Patient does not have a  healthcare power of attorney or living will, nor does he appear to be interested in establishing those.  However, he says he would want his daughters to be involved in decision-making if necessary.  He volunteered that he has a DNR form in the home.  He confirms that he would not want to be resuscitated nor have his life prolonged artificially.  We completed a MOST Form today.  Patient says that he would be in agreement with hospitalization if necessary.  However, he verbalized a desire to avoid futile care and hospitalizations, if it became clear that such care would not benefit him would lead to meaningful improvement.   I completed a MOST form today. The patient and family outlined their wishes for the following treatment decisions:  Cardiopulmonary Resuscitation: Do Not Attempt Resuscitation (DNR/No CPR)  Medical Interventions: Limited Additional Interventions: Use medical treatment, IV fluids and cardiac monitoring as indicated, DO NOT USE intubation or mechanical ventilation. May consider use of less invasive airway support such as BiPAP or CPAP. Also provide comfort measures. Transfer to the hospital if indicated. Avoid intensive care.   Antibiotics: Antibiotics if indicated  IV Fluids: IV fluids if indicated  Feeding Tube: No feeding tube    I would recommend future consideration of hospice in the event of significant decline.  Although I did not explicitly discuss this today, given patient's verbalized desire to avoid futile care, hospice would seem appropriate in the event of decline.  PLAN: Follow labs per PCP DNR MOST Form completed as outlined above RTC in 2 weeks.  Patient to see Dr. Finnegan again 1 month.   Patient expressed understanding and was in agreement with this plan. He also understands that He can call clinic at any time with any questions, concerns, or complaints.       Time Total: 40 minutes  Visit consisted of counseling and education dealing with the complex  and emotionally intense issues of symptom management and palliative care in the setting of serious and potentially life-threatening illness.Greater than 50%  of this time was spent counseling and coordinating care related to the above assessment and plan.  Signed by: Altha Harm, PhD, DNP, NP-C, [facility] [phone] (Work Cell)

## 2017-11-06 NOTE — Telephone Encounter (Signed)
Copied from [place] [id number]. Topic: Quick Communication - Home Health Verbal Orders >> Nov 06, 2017  2:03 PM Berneta Levins wrote: Caller/Agency: Skeet Simmer from [place] Number: [phone], OK to leave a message Requesting OT/PT/Skilled Nursing/Social Work: PT to work on balance, strengthening, home exercise program Frequency: 2x a week for 2 weeks, then 1x a week for 3 weeks

## 2017-11-06 NOTE — Progress Notes (Signed)
Patient reports back pain today, 7/10. Patient hospitalized and had 5 liters of fluid removed since last visit.

## 2017-11-06 NOTE — Telephone Encounter (Signed)
Called pt to inform him that we have refilled the Lactulose prescription and we have also referred pt to the [facility], as pt requested, for a second opinion on pt condition. LVM to return call

## 2017-11-06 NOTE — Telephone Encounter (Signed)
Please advise 

## 2017-11-06 NOTE — Telephone Encounter (Signed)
Approve   [place]

## 2017-11-07 DIAGNOSIS — K7011 Alcoholic hepatitis with ascites: Secondary | ICD-10-CM | POA: Diagnosis not present

## 2017-11-07 DIAGNOSIS — E1122 Type 2 diabetes mellitus with diabetic chronic kidney disease: Secondary | ICD-10-CM | POA: Diagnosis not present

## 2017-11-07 DIAGNOSIS — R69 Illness, unspecified: Secondary | ICD-10-CM | POA: Diagnosis not present

## 2017-11-07 DIAGNOSIS — I129 Hypertensive chronic kidney disease with stage 1 through stage 4 chronic kidney disease, or unspecified chronic kidney disease: Secondary | ICD-10-CM | POA: Diagnosis not present

## 2017-11-07 DIAGNOSIS — N183 Chronic kidney disease, stage 3 (moderate): Secondary | ICD-10-CM | POA: Diagnosis not present

## 2017-11-07 LAB — THYROID PANEL WITH TSH
Free Thyroxine Index: 2.1 (ref 1.2–4.9)
T3 Uptake Ratio: 34 % (ref 24–39)
T4 TOTAL: 6.1 ug/dL (ref 4.5–12.0)
TSH: 13.18 u[IU]/mL — AB (ref 0.450–4.500)

## 2017-11-08 ENCOUNTER — Telehealth: Payer: Self-pay | Admitting: Internal Medicine

## 2017-11-08 LAB — BETA-HYDROXYBUTYRIC ACID: Beta-Hydroxybutyric Acid: 0.81 mmol/L — ABNORMAL HIGH

## 2017-11-08 NOTE — Telephone Encounter (Signed)
Copied from [place] [phone]. Topic: General - Other >> Nov 08, 2017  1:05 PM Lennox Solders wrote: Reason for CRM: lemuel PT with [facility] is calling he had missed visit with pt today. Pt had chemo on 11-6 and does not feel like doing physical therapy today

## 2017-11-08 NOTE — Telephone Encounter (Signed)
Just a FYI

## 2017-11-08 NOTE — Telephone Encounter (Signed)
Noted  

## 2017-11-08 NOTE — Telephone Encounter (Signed)
Left voicemail message for Rodney Stewart [facility] [phone] approving below orders

## 2017-11-10 ENCOUNTER — Other Ambulatory Visit: Payer: Self-pay | Admitting: Internal Medicine

## 2017-11-10 DIAGNOSIS — L03032 Cellulitis of left toe: Secondary | ICD-10-CM

## 2017-11-11 DIAGNOSIS — N433 Hydrocele, unspecified: Secondary | ICD-10-CM | POA: Diagnosis not present

## 2017-11-11 DIAGNOSIS — Z6822 Body mass index (BMI) 22.0-22.9, adult: Secondary | ICD-10-CM | POA: Diagnosis not present

## 2017-11-11 NOTE — Telephone Encounter (Signed)
Why do we need to refill given 10 day supply on 11/05/17?  Call pt and pharmacy   [place]

## 2017-11-11 NOTE — Progress Notes (Signed)
Yes, [facility] has a liver clinic. It has been sent to them as urgent. Thx,  melissa

## 2017-11-12 ENCOUNTER — Other Ambulatory Visit: Payer: Self-pay | Admitting: Internal Medicine

## 2017-11-12 DIAGNOSIS — R600 Localized edema: Secondary | ICD-10-CM

## 2017-11-12 NOTE — Telephone Encounter (Signed)
Copied from [place] [id number]. Topic: Quick Communication - Rx Refill/Question >> Nov 12, 2017 11:29 AM Reyne Dumas L wrote: Medication: furosemide (LASIX) 20 MG tablet  Has the patient contacted their pharmacy? Yes - but this will be the first time we fill medication (Agent: If no, request that the patient contact the pharmacy for the refill.) (Agent: If yes, when and what did the pharmacy advise?)  Preferred Pharmacy (with phone number or street name): [facility] ([facility]), [place] [phone] (Phone) [phone] (Fax)  Agent: Please be advised that RX refills may take up to 3 business days. We ask that you follow-up with your pharmacy.

## 2017-11-12 NOTE — Telephone Encounter (Signed)
Requested medication (s) are due for refill today -yes  Requested medication (s) are on the active medication list -yes  Future visit scheduled -no  Last refill: 10/22/17 0 RF  Notes to clinic: Patient is requesting PCP refill medication- this will be new Rx for provider- sent for review.  Requested Prescriptions  Pending Prescriptions Disp Refills   furosemide (LASIX) 20 MG tablet 30 tablet 0    Sig: Take 2 tablets (40 mg total) by mouth daily. In am     Cardiovascular:  Diuretics - Loop Failed - 11/12/2017 11:34 AM      Failed - Ca in normal range and within 360 days    Calcium  Date Value Ref Range Status  11/06/2017 8.7 (L) 8.9 - 10.3 mg/dL Final   Calcium, Total (PTH)  Date Value Ref Range Status  02/19/2017 8.2 (L) 8.6 - 10.2 mg/dL Final         Failed - Na in normal range and within 360 days    Sodium  Date Value Ref Range Status  11/06/2017 126 (L) 135 - 145 mmol/L Final  09/24/2017 121 (L) 134 - 144 mmol/L Final  04/28/2012 135 (L) 136 - 145 mmol/L Final         Failed - Cr in normal range and within 360 days    Creatinine  Date Value Ref Range Status  04/28/2012 1.49 (H) 0.60 - 1.30 mg/dL Final   Creatinine, Ser  Date Value Ref Range Status  11/06/2017 1.83 (H) 0.61 - 1.24 mg/dL Final         Passed - K in normal range and within 360 days    Potassium  Date Value Ref Range Status  11/06/2017 3.5 3.5 - 5.1 mmol/L Final  04/28/2012 5.0 3.5 - 5.1 mmol/L Final         Passed - Last BP in normal range    BP Readings from Last 1 Encounters:  11/06/17 127/79         Passed - Valid encounter within last 6 months    Recent Outpatient Visits          1 week ago Altered mental status, unspecified altered mental status type   [place] [name], [name], MD   2 weeks ago Cirrhosis of liver with ascites, unspecified hepatic cirrhosis type [facility])   [place], [name], MD   1 month ago Ascites  due to alcoholic hepatitis   [place] [name], [name], MD   1 month ago Cirrhosis of liver with ascites, unspecified hepatic cirrhosis type [facility])   [place] [name], [name], MD   5 months ago Bronchospasm   [place], [name], MD      Future Appointments            In 1 month Jonathon Bellows, MD [place]   In 1 month [name], [name], MD [place], [place]   In 12 months O'Brien-Blaney, Bryson [place], LPN [place], [place]   In 12 months [name], [name], MD [place], [facility]            Requested Prescriptions  Pending Prescriptions Disp Refills   furosemide (LASIX) 20 MG tablet 30 tablet 0    Sig: Take 2 tablets (40 mg total) by mouth daily. In am     Cardiovascular:  Diuretics - Loop Failed - 11/12/2017 11:34 AM  Failed - Ca in normal range and within 360 days    Calcium  Date Value Ref Range Status  11/06/2017 8.7 (L) 8.9 - 10.3 mg/dL Final   Calcium, Total (PTH)  Date Value Ref Range Status  02/19/2017 8.2 (L) 8.6 - 10.2 mg/dL Final         Failed - Na in normal range and within 360 days    Sodium  Date Value Ref Range Status  11/06/2017 126 (L) 135 - 145 mmol/L Final  09/24/2017 121 (L) 134 - 144 mmol/L Final  04/28/2012 135 (L) 136 - 145 mmol/L Final         Failed - Cr in normal range and within 360 days    Creatinine  Date Value Ref Range Status  04/28/2012 1.49 (H) 0.60 - 1.30 mg/dL Final   Creatinine, Ser  Date Value Ref Range Status  11/06/2017 1.83 (H) 0.61 - 1.24 mg/dL Final         Passed - K in normal range and within 360 days    Potassium  Date Value Ref Range Status  11/06/2017 3.5 3.5 - 5.1 mmol/L Final  04/28/2012 5.0 3.5 - 5.1 mmol/L Final         Passed - Last BP in normal range    BP Readings from Last 1 Encounters:  11/06/17 127/79         Passed  - Valid encounter within last 6 months    Recent Outpatient Visits          1 week ago Altered mental status, unspecified altered mental status type   [place] [name], [name], MD   2 weeks ago Cirrhosis of liver with ascites, unspecified hepatic cirrhosis type [facility])   [place] [name], [name], MD   1 month ago Ascites due to alcoholic hepatitis   [place] [name], [name], MD   1 month ago Cirrhosis of liver with ascites, unspecified hepatic cirrhosis type [facility])   [place] [name], [name], MD   5 months ago Bronchospasm   [place] [facility] [place], [name], MD      Future Appointments            In 1 month Jonathon Bellows, MD [place]   In 1 month [name], [name], MD [place], [place]   In 12 months O'Brien-Blaney, [place], LPN [place], [place]   In 12 months [name], [name], MD [facility] [place], [facility]

## 2017-11-13 ENCOUNTER — Telehealth: Payer: Self-pay | Admitting: Gastroenterology

## 2017-11-13 DIAGNOSIS — R69 Illness, unspecified: Secondary | ICD-10-CM | POA: Diagnosis not present

## 2017-11-13 DIAGNOSIS — I129 Hypertensive chronic kidney disease with stage 1 through stage 4 chronic kidney disease, or unspecified chronic kidney disease: Secondary | ICD-10-CM | POA: Diagnosis not present

## 2017-11-13 DIAGNOSIS — K7011 Alcoholic hepatitis with ascites: Secondary | ICD-10-CM | POA: Diagnosis not present

## 2017-11-13 DIAGNOSIS — E1122 Type 2 diabetes mellitus with diabetic chronic kidney disease: Secondary | ICD-10-CM | POA: Diagnosis not present

## 2017-11-13 DIAGNOSIS — N183 Chronic kidney disease, stage 3 (moderate): Secondary | ICD-10-CM | POA: Diagnosis not present

## 2017-11-13 MED ORDER — FUROSEMIDE 20 MG PO TABS: 20.0000 mg | ORAL_TABLET | Freq: Every day | ORAL | 2 refills | Status: DC

## 2017-11-13 NOTE — Telephone Encounter (Signed)
Why do we need to refill given 10 day supply on 11/05/17?  Call pt and pharmacy   [place]

## 2017-11-13 NOTE — Telephone Encounter (Signed)
Why do we need to refill given 10 day supply on 11/05/17?  Call pt and pharmacy   [place]

## 2017-11-13 NOTE — Telephone Encounter (Signed)
PT left vm he like to speak to someone to set up a Parathentisis please call pt

## 2017-11-14 ENCOUNTER — Other Ambulatory Visit: Payer: Self-pay

## 2017-11-14 DIAGNOSIS — R69 Illness, unspecified: Secondary | ICD-10-CM | POA: Diagnosis not present

## 2017-11-14 DIAGNOSIS — K7011 Alcoholic hepatitis with ascites: Secondary | ICD-10-CM | POA: Diagnosis not present

## 2017-11-14 DIAGNOSIS — E1122 Type 2 diabetes mellitus with diabetic chronic kidney disease: Secondary | ICD-10-CM | POA: Diagnosis not present

## 2017-11-14 DIAGNOSIS — R188 Other ascites: Principal | ICD-10-CM

## 2017-11-14 DIAGNOSIS — K7031 Alcoholic cirrhosis of liver with ascites: Secondary | ICD-10-CM

## 2017-11-14 DIAGNOSIS — N183 Chronic kidney disease, stage 3 (moderate): Secondary | ICD-10-CM | POA: Diagnosis not present

## 2017-11-14 DIAGNOSIS — I129 Hypertensive chronic kidney disease with stage 1 through stage 4 chronic kidney disease, or unspecified chronic kidney disease: Secondary | ICD-10-CM | POA: Diagnosis not present

## 2017-11-14 DIAGNOSIS — K7460 Unspecified cirrhosis of liver: Secondary | ICD-10-CM

## 2017-11-14 MED ORDER — ALBUMIN HUMAN 25 % IV SOLN: 25.0000 g | Freq: Once | INTRAVENOUS | 0 refills | Status: AC

## 2017-11-14 MED ORDER — ALBUMIN HUMAN 25 % IV SOLN: 25.0000 g | Freq: Once | INTRAVENOUS | 0 refills | Status: DC

## 2017-11-14 NOTE — Telephone Encounter (Signed)
Spoke with pt regarding his request for a paracentesis appointment. Pt states he needs an urgent paracentesis appointment scheduled preferably before the weekend. Pt states he's experiencing severe discomfort due to the ascites. I explained that I will have to consult with Dr. Vicente Males for permission to order. Will schedule as soon as Dr. Vicente Males advises.

## 2017-11-14 NOTE — Discharge Instructions (Signed)
Paracentesis, Care After °Refer to this sheet in the next few weeks. These instructions provide you with information about caring for yourself after your procedure. Your health care provider may also give you more specific instructions. Your treatment has been planned according to current medical practices, but problems sometimes occur. Call your health care provider if you have any problems or questions after your procedure. °What can I expect after the procedure? °After your procedure, it is common to have a small amount of clear fluid coming from the puncture site. °Follow these instructions at home: °· Return to your normal activities as told by your health care provider. Ask your health care provider what activities are safe for you. °· Take over-the-counter and prescription medicines only as told by your health care provider. °· Do not take baths, swim, or use a hot tub until your health care provider approves. °· Follow instructions from your health care provider about: °? How to take care of your puncture site. °? When and how you should change your bandage (dressing). °? When you should remove your dressing. °· Check your puncture area every day signs of infection. Watch for: °? Redness, swelling, or pain. °? Fluid, blood, or pus. °· Keep all follow-up visits as told by your health care provider. This is important. °Contact a health care provider if: °· You have redness, swelling, or pain at your puncture site. °· You start to have more clear fluid coming from your puncture site. °· You have blood or pus coming from your puncture site. °· You have chills. °· You have a fever. °Get help right away if: °· You develop chest pain or shortness of breath. °· You develop increasing pain, discomfort, or swelling in your abdomen. °· You feel dizzy or light-headed or you pass out. °This information is not intended to replace advice given to you by your health care provider. Make sure you discuss any questions you  have with your health care provider. °Document Released: 05/04/2014 Document Revised: 05/26/2015 Document Reviewed: 03/02/2014 °Elsevier Interactive Patient Education © 2018 [employer] ° °

## 2017-11-14 NOTE — Addendum Note (Signed)
Addended by: Dorethea Clan on: 11/14/2017 02:55 PM   Modules accepted: Orders

## 2017-11-14 NOTE — Telephone Encounter (Signed)
Pt calling abt the parenthesis procedure. Would like a return call [phone]

## 2017-11-14 NOTE — Telephone Encounter (Signed)
Spoke with pt and informed him that I was able to schedule him for a paracentesis tomorrow at 1:00 pm. Pt is aware of paracentesis appointment information.

## 2017-11-15 ENCOUNTER — Ambulatory Visit
Admission: RE | Admit: 2017-11-15 | Discharge: 2017-11-15 | Disposition: A | Discharge status: Home / Self Care | Payer: Medicare HMO | Source: Ambulatory Visit | Attending: Gastroenterology | Admitting: Gastroenterology

## 2017-11-15 DIAGNOSIS — K7031 Alcoholic cirrhosis of liver with ascites: Secondary | ICD-10-CM | POA: Diagnosis not present

## 2017-11-15 DIAGNOSIS — K7011 Alcoholic hepatitis with ascites: Secondary | ICD-10-CM | POA: Diagnosis not present

## 2017-11-15 DIAGNOSIS — I129 Hypertensive chronic kidney disease with stage 1 through stage 4 chronic kidney disease, or unspecified chronic kidney disease: Secondary | ICD-10-CM | POA: Diagnosis not present

## 2017-11-15 DIAGNOSIS — N183 Chronic kidney disease, stage 3 (moderate): Secondary | ICD-10-CM | POA: Diagnosis not present

## 2017-11-15 DIAGNOSIS — E1122 Type 2 diabetes mellitus with diabetic chronic kidney disease: Secondary | ICD-10-CM | POA: Diagnosis not present

## 2017-11-15 DIAGNOSIS — R188 Other ascites: Secondary | ICD-10-CM | POA: Diagnosis not present

## 2017-11-15 DIAGNOSIS — R69 Illness, unspecified: Secondary | ICD-10-CM | POA: Diagnosis not present

## 2017-11-15 DIAGNOSIS — K7460 Unspecified cirrhosis of liver: Secondary | ICD-10-CM

## 2017-11-15 MED ORDER — ALBUMIN HUMAN 25 % IV SOLN
25.0000 g | Freq: Once | INTRAVENOUS | Status: AC
  Administered 2017-11-15: 25 g via INTRAVENOUS

## 2017-11-15 MED ORDER — ALBUMIN HUMAN 25 % IV SOLN
INTRAVENOUS | Status: AC
  Filled 2017-11-15: qty 100

## 2017-11-15 NOTE — Procedures (Signed)
Pre Procedural Dx: Symptomatic Ascites Post Procedural Dx: Same  Successful US guided paracentesis yielding 6 L of serous ascitic fluid.  EBL: None Complications: None immediate  Jay Marisue Canion, MD Pager #: [phone]   

## 2017-11-18 ENCOUNTER — Telehealth: Payer: Self-pay | Admitting: Gastroenterology

## 2017-11-18 NOTE — Telephone Encounter (Signed)
Patient calling to speak with nurse about paracentesis appointments. Patient states he is having a hard time scheduling them and wants to discuss options. Please call patient to discuss.

## 2017-11-19 NOTE — Telephone Encounter (Signed)
Pt is calling abt paracentesis appt options and would like a call from the nurse.

## 2017-11-20 ENCOUNTER — Inpatient Hospital Stay (HOSPITAL_BASED_OUTPATIENT_CLINIC_OR_DEPARTMENT_OTHER): Payer: Medicare HMO | Admitting: Hospice and Palliative Medicine

## 2017-11-20 ENCOUNTER — Inpatient Hospital Stay: Payer: Medicare HMO

## 2017-11-20 VITALS — BP 112/74 | HR 89 | Temp 96.1°F | Resp 20 | Wt 150.0 lb

## 2017-11-20 DIAGNOSIS — C7802 Secondary malignant neoplasm of left lung: Secondary | ICD-10-CM

## 2017-11-20 DIAGNOSIS — Z87891 Personal history of nicotine dependence: Secondary | ICD-10-CM | POA: Diagnosis not present

## 2017-11-20 DIAGNOSIS — C7800 Secondary malignant neoplasm of unspecified lung: Principal | ICD-10-CM

## 2017-11-20 DIAGNOSIS — C642 Malignant neoplasm of left kidney, except renal pelvis: Secondary | ICD-10-CM | POA: Diagnosis not present

## 2017-11-20 DIAGNOSIS — R188 Other ascites: Secondary | ICD-10-CM

## 2017-11-20 DIAGNOSIS — Z5112 Encounter for antineoplastic immunotherapy: Secondary | ICD-10-CM | POA: Diagnosis not present

## 2017-11-20 DIAGNOSIS — Z66 Do not resuscitate: Secondary | ICD-10-CM

## 2017-11-20 DIAGNOSIS — C7801 Secondary malignant neoplasm of right lung: Secondary | ICD-10-CM | POA: Diagnosis not present

## 2017-11-20 DIAGNOSIS — Z79899 Other long term (current) drug therapy: Secondary | ICD-10-CM

## 2017-11-20 DIAGNOSIS — Z905 Acquired absence of kidney: Secondary | ICD-10-CM

## 2017-11-20 DIAGNOSIS — Z515 Encounter for palliative care: Secondary | ICD-10-CM | POA: Diagnosis not present

## 2017-11-20 DIAGNOSIS — D61818 Other pancytopenia: Secondary | ICD-10-CM

## 2017-11-20 DIAGNOSIS — C649 Malignant neoplasm of unspecified kidney, except renal pelvis: Secondary | ICD-10-CM

## 2017-11-20 DIAGNOSIS — R69 Illness, unspecified: Secondary | ICD-10-CM | POA: Diagnosis not present

## 2017-11-20 DIAGNOSIS — K7460 Unspecified cirrhosis of liver: Secondary | ICD-10-CM | POA: Diagnosis not present

## 2017-11-20 LAB — CBC WITH DIFFERENTIAL/PLATELET
ABS IMMATURE GRANULOCYTES: 0.01 10*3/uL (ref 0.00–0.07)
BASOS ABS: 0 10*3/uL (ref 0.0–0.1)
Basophils Relative: 0 %
Eosinophils Absolute: 0.1 10*3/uL (ref 0.0–0.5)
Eosinophils Relative: 2 %
HEMATOCRIT: 23.7 % — AB (ref 39.0–52.0)
HEMOGLOBIN: 7.6 g/dL — AB (ref 13.0–17.0)
IMMATURE GRANULOCYTES: 0 %
LYMPHS PCT: 13 %
Lymphs Abs: 0.4 10*3/uL — ABNORMAL LOW (ref 0.7–4.0)
MCH: 27.8 pg (ref 26.0–34.0)
MCHC: 32.1 g/dL (ref 30.0–36.0)
MCV: 86.8 fL (ref 80.0–100.0)
Monocytes Absolute: 0.3 10*3/uL (ref 0.1–1.0)
Monocytes Relative: 8 %
NEUTROS ABS: 2.5 10*3/uL (ref 1.7–7.7)
Neutrophils Relative %: 77 %
Platelets: 101 10*3/uL — ABNORMAL LOW (ref 150–400)
RBC: 2.73 MIL/uL — ABNORMAL LOW (ref 4.22–5.81)
RDW: 14.7 % (ref 11.5–15.5)
WBC: 3.3 10*3/uL — ABNORMAL LOW (ref 4.0–10.5)
nRBC: 0 % (ref 0.0–0.2)

## 2017-11-20 LAB — COMPREHENSIVE METABOLIC PANEL
ALBUMIN: 2.5 g/dL — AB (ref 3.5–5.0)
ALK PHOS: 297 U/L — AB (ref 38–126)
ALT: 19 U/L (ref 0–44)
AST: 37 U/L (ref 15–41)
Anion gap: 8 (ref 5–15)
BILIRUBIN TOTAL: 1.9 mg/dL — AB (ref 0.3–1.2)
BUN: 37 mg/dL — ABNORMAL HIGH (ref 8–23)
CO2: 27 mmol/L (ref 22–32)
CREATININE: 1.44 mg/dL — AB (ref 0.61–1.24)
Calcium: 8.1 mg/dL — ABNORMAL LOW (ref 8.9–10.3)
Chloride: 92 mmol/L — ABNORMAL LOW (ref 98–111)
GFR calc Af Amer: 59 mL/min — ABNORMAL LOW (ref >60)
GFR calc non Af Amer: 51 mL/min — ABNORMAL LOW (ref >60)
Glucose, Bld: 143 mg/dL — ABNORMAL HIGH (ref 70–99)
POTASSIUM: 4 mmol/L (ref 3.5–5.1)
Sodium: 127 mmol/L — ABNORMAL LOW (ref 135–145)
TOTAL PROTEIN: 6.6 g/dL (ref 6.5–8.1)

## 2017-11-20 MED ORDER — SODIUM CHLORIDE 0.9 % IV SOLN
Freq: Once | INTRAVENOUS | Status: AC
  Administered 2017-11-20: 14:00:00 via INTRAVENOUS
  Filled 2017-11-20: qty 250

## 2017-11-20 MED ORDER — SODIUM CHLORIDE 0.9 % IV SOLN
240.0000 mg | Freq: Once | INTRAVENOUS | Status: AC
  Administered 2017-11-20: 240 mg via INTRAVENOUS
  Filled 2017-11-20: qty 24

## 2017-11-20 NOTE — Telephone Encounter (Signed)
Called pt regarding his questions about scheduling paracentesis appointments. LVM to return call

## 2017-11-20 NOTE — Progress Notes (Signed)
[place]  Telephone:([phone] [phone]   Name: Rodney Stewart Date: 11/20/2017 MRN: [id number]  DOB: August 12, 1955  Patient Care Team: [name], [name], MD as PCP - General (Internal Medicine)    REASON FOR CONSULTATION: Palliative Care consult requested for this 62 y.o. male with multiple medical problems including stage IV renal cell carcinoma status post left nephrectomy (May 2014) metastatic to lung, advanced decompensated cirrhosis with recurrent ascites, CKD 3, who was recently hospitalized 10/18/2017 to 10/20/2017 with hepatorenal syndrome.  Patient's RCC is being managed with second line nivolumab.  Palliative care was consulted to help establish health care.  SOCIAL HISTORY:    Patient is divorced.  Lives at home alone.  Has 2 daughters who are involved in his care.  Patient used to work and [employer] fundraiser.  ADVANCE DIRECTIVES:  MOST form  CODE STATUS: DNR  PAST MEDICAL HISTORY: Past Medical History:  Diagnosis Date  . Anemia   . Ascites   . BPH (benign prostatic hyperplasia)   . Cirrhosis ([place])   . CKD (chronic kidney disease) stage 3, GFR 30-59 ml/min (HCC)   . Clear cell renal cell carcinoma ([place]) 2014   Left Nephrectomy.  . Colon polyps   . Diabetes mellitus without complication ([place])    type 2   . Dyspnea    with exertion  . GERD (gastroesophageal reflux disease)   . History of gout   . History of nephrectomy    Left  . Hypertension   . Neuropathy   . Renal Cancer    Renal Cancer  . Renal cell carcinoma of left kidney (HCC)    mets to lungs  . Umbilical hernia     PAST SURGICAL HISTORY:  Past Surgical History:  Procedure Laterality Date  . BACK SURGERY     ruptured disc 1991   . ENDOBRONCHIAL ULTRASOUND N/A 10/14/2015   Procedure: ENDOBRONCHIAL ULTRASOUND;  Surgeon: [name], MD;  Location: [facility] ORS;  Service: Pulmonary;  Laterality: N/A;  . ESOPHAGOGASTRODUODENOSCOPY  (EGD) WITH PROPOFOL N/A 04/15/2017   Procedure: ESOPHAGOGASTRODUODENOSCOPY (EGD) WITH PROPOFOL;  Surgeon: Jonathon Bellows, MD;  Location[facility] ENDOSCOPY;  Service: Gastroenterology;  Laterality: N/A;  Screen for esophageal varices  . EYE SURGERY Bilateral    Cataract Extraction with IOL  . IR RADIOLOGIST EVAL & MGMT  10/15/2017  . KNEE SURGERY Right   . NEPHRECTOMY     left kidney 2014 renal cell cancer     HEMATOLOGY/ONCOLOGY HISTORY:  Oncology History   Patient underwent left nephrectomy on May 16, 2012 which revealed a clear cell grade 2 renal cell carcinoma, stage TIIIa, N0, M0. Tumor size of 16 cm. Patient was noted to have renal vein involvement, but no other structures were involved. 0 of 2 lymph nodes were negative for disease. PET scan on October 20, 2015 revealed metastatic disease and patient was initiated on Votrient. This was subsequently discontinued in March 2018 secondary to progression of disease. Patient initiated second line treatment with nivolumab on Monday, April 02, 2016     Metastatic renal cell carcinoma to lung [facility])    Initial Diagnosis    Metastatic renal cell carcinoma to lung ([place])     ALLERGIES:  has No Known Allergies.  MEDICATIONS:  Current Outpatient Medications  Medication Sig Dispense Refill  . allopurinol (ZYLOPRIM) 100 MG tablet Take 1 tablet (100 mg total) by mouth daily. 90 tablet 3  . ALPRAZolam (XANAX) 1 MG tablet Take 1 mg by  mouth 2 (two) times daily as needed for anxiety or sleep.     Marland Kitchen aspirin EC 81 MG tablet Take 81 mg by mouth daily.    Marland Kitchen doxycycline (VIBRA-TABS) 100 MG tablet Take 1 tablet (100 mg total) by mouth 2 (two) times daily. With food 20 tablet 0  . feeding supplement, ENSURE ENLIVE, (ENSURE ENLIVE) LIQD Take 237 mLs by mouth 3 (three) times daily between meals. 237 mL 12  . furosemide (LASIX) 20 MG tablet Take 1-2 tablets (20-40 mg total) by mouth daily. In am as needed leg edema 60 tablet 2  . gabapentin (NEURONTIN) 100 MG capsule  Take 2 capsules (200 mg total) by mouth 3 (three) times daily. (Patient taking differently: Take 200 mg by mouth 2 (two) times daily. ) 360 capsule 5  . insulin aspart (NOVOLOG) 100 UNIT/ML FlexPen Before meals 131-180 2 units, 181-240 4 units, 241-300 6 units, 301-350 8 units, 351-400 10 units, >400 12 units 15 mL 11  . Insulin Detemir (LEVEMIR FLEXTOUCH) 100 UNIT/ML Pen Inject 18 Units into the skin 2 (two) times daily. With food 12 pen 12  . Insulin Pen Needle 32G X 4 MM MISC 1 Device by Does not apply route 2 (two) times daily. E11.9 200 each 12  . lactulose (CHRONULAC) 10 GM/15ML solution Take 45 mLs (30 g total) by mouth daily. 1800 mL 2  . levothyroxine (SYNTHROID, LEVOTHROID) 50 MCG tablet Take 1 tablet (50 mcg total) by mouth daily before breakfast. 30 minutes 90 tablet 0  . lovastatin (MEVACOR) 20 MG tablet Take 1 tablet (20 mg total) by mouth daily at 6 PM. 90 tablet 3  . magic mouthwash w/lidocaine SOLN Take 5 mLs by mouth 3 (three) times daily as needed for mouth pain. Swish and spit 250 mL 0  . mupirocin ointment (BACTROBAN) 2 % Apply 1 application topically 3 (three) times daily. Left big toe and right back 30 g 1  . ONE TOUCH ULTRA TEST test strip Bid 180 each 3  . oxyCODONE (ROXICODONE) 5 MG immediate release tablet Take 1 tablet (5 mg total) by mouth every 6 (six) hours as needed for severe pain or breakthrough pain. 45 tablet 0  . potassium chloride SA (K-DUR,KLOR-CON) 20 MEQ tablet Take 1 tablet (20 mEq total) by mouth daily for 3 days. 3 tablet 0  . tamsulosin (FLOMAX) 0.4 MG CAPS capsule Take 1 capsule (0.4 mg total) by mouth every other day. With supper 90 capsule 3  . valACYclovir (VALTREX) 1000 MG tablet Take 1 tablet (1,000 mg total) by mouth 2 (two) times daily. X 5-10 days with food 30 tablet 5   No current facility-administered medications for this visit.     VITAL SIGNS: There were no vitals taken for this visit. There were no vitals filed for this visit.  Estimated  body mass index is 20.92 kg/m as calculated from the following:   Height as of 11/05/17: 5\' 11"  (1.803 m).   Weight as of an earlier encounter on 11/20/17: 150 lb (68 kg).  LABS: CBC:    Component Value Date/Time   WBC 3.3 (L) 11/20/2017 1311   HGB 7.6 (L) 11/20/2017 1311   HGB 8.2 (L) 04/28/2012 1503   HCT 23.7 (L) 11/20/2017 1311   HCT 26.1 (L) 04/28/2012 1503   PLT 101 (L) 11/20/2017 1311   PLT 357 04/28/2012 1503   MCV 86.8 11/20/2017 1311   MCV 69 (L) 04/28/2012 1503   NEUTROABS 2.5 11/20/2017 1311   NEUTROABS 4.1  04/28/2012 1503   LYMPHSABS 0.4 (L) 11/20/2017 1311   LYMPHSABS 0.7 (L) 04/28/2012 1503   MONOABS 0.3 11/20/2017 1311   MONOABS 0.6 04/28/2012 1503   EOSABS 0.1 11/20/2017 1311   EOSABS 0.1 04/28/2012 1503   BASOSABS 0.0 11/20/2017 1311   BASOSABS 0.0 04/28/2012 1503   Comprehensive Metabolic Panel:    Component Value Date/Time   NA 127 (L) 11/20/2017 1311   NA 121 (L) 09/24/2017 1458   NA 135 (L) 04/28/2012 1503   K 4.0 11/20/2017 1311   K 5.0 04/28/2012 1503   CL 92 (L) 11/20/2017 1311   CL 101 04/28/2012 1503   CO2 27 11/20/2017 1311   CO2 26 04/28/2012 1503   BUN 37 (H) 11/20/2017 1311   BUN 37 (H) 09/24/2017 1458   BUN 13 04/28/2012 1503   CREATININE 1.44 (H) 11/20/2017 1311   CREATININE 1.49 (H) 04/28/2012 1503   GLUCOSE 143 (H) 11/20/2017 1311   GLUCOSE 239 (H) 04/28/2012 1503   CALCIUM 8.1 (L) 11/20/2017 1311   CALCIUM 8.2 (L) 02/19/2017 1522   AST 37 11/20/2017 1311   AST 25 04/28/2012 1503   ALT 19 11/20/2017 1311   ALT 18 04/28/2012 1503   ALKPHOS 297 (H) 11/20/2017 1311   ALKPHOS 181 (H) 04/28/2012 1503   BILITOT 1.9 (H) 11/20/2017 1311   BILITOT 3.9 (H) 09/23/2017 1336   BILITOT 0.8 04/28/2012 1503   PROT 6.6 11/20/2017 1311   PROT 5.6 (L) 09/23/2017 1336   PROT 8.4 (H) 04/28/2012 1503   ALBUMIN 2.5 (L) 11/20/2017 1311   ALBUMIN 2.8 (L) 09/23/2017 1336   ALBUMIN 3.1 (L) 04/28/2012 1503    RADIOGRAPHIC STUDIES: US  Paracentesis  Result Date: 11/15/2017 INDICATION: History of cirrhosis with recurrent symptomatic ascites. Please perform ultrasound-guided paracentesis for therapeutic purposes. EXAM: ULTRASOUND-GUIDED PARACENTESIS COMPARISON:  Multiple previous ultrasound-guided paracentesis, most recently on 10/21/2017 MEDICATIONS: None. COMPLICATIONS: None immediate. TECHNIQUE: Informed written consent was obtained from the patient after a discussion of the risks, benefits and alternatives to treatment. A timeout was performed prior to the initiation of the procedure. Initial ultrasound scanning demonstrates a large amount of ascites within the right lower abdominal quadrant. The right lower abdomen was prepped and draped in the usual sterile fashion. 1% lidocaine with epinephrine was used for local anesthesia. An ultrasound image was saved for documentation purposed. An 8 Fr Safe-T-Centesis catheter was introduced. The paracentesis was performed. The catheter was removed and a dressing was applied. The patient tolerated the procedure well without immediate post procedural complication. FINDINGS: A total of approximately 6 liters of serous fluid was removed. IMPRESSION: Successful ultrasound-guided paracentesis yielding 6 liters of peritoneal fluid. Electronically Signed   By: Sandi Mariscal M.D.   On: 11/15/2017 15:08    PERFORMANCE STATUS (ECOG) : 2 - Symptomatic, <50% confined to bed  Review of Systems As noted above. Otherwise, a complete review of systems is negative.  Physical Exam General: NAD, frail appearing, thin Pulmonary: Unlabored Abdomen: Distended, positive fluid wave, umbilical hernia Extremities: no edema, no joint deformities Skin: no rashes Neurological: Weakness but otherwise nonfocal  IMPRESSION: Follow up visit today with patient and daughter while he received treatment/infusion. Patient reports doing well since last seen. Daughter concurs. Patient says his appetite and weakness have both  improved.   Patient continues to require repeat large-volume paracentesis. Last was 11/15 with 6L removed. Previous was on 10/21 with 5L removed. Consideration could be given for insertion of a Tenckhoff catheter in the future if patient  is requiring paracentesis with increasing frequency.  Symptomatically, patient denies any distressing symptoms today.  He says pain is well controlled.  PLAN: Continue treatment per oncology DNR RTC in 2-3 weeks   Patient expressed understanding and was in agreement with this plan. He also understands that He can call clinic at any time with any questions, concerns, or complaints.    Time Total: 15 minutes  Visit consisted of counseling and education dealing with the complex and emotionally intense issues of symptom management and palliative care in the setting of serious and potentially life-threatening illness.Greater than 50%  of this time was spent counseling and coordinating care related to the above assessment and plan.  Signed by: Altha Harm, PhD, DNP, NP-C, [facility] [phone] (Work Cell)

## 2017-11-21 ENCOUNTER — Telehealth: Payer: Self-pay | Admitting: Internal Medicine

## 2017-11-21 LAB — THYROID PANEL WITH TSH
Free Thyroxine Index: 2.4 (ref 1.2–4.9)
T3 UPTAKE RATIO: 34 % (ref 24–39)
T4 TOTAL: 7.2 ug/dL (ref 4.5–12.0)
TSH: 11.14 u[IU]/mL — AB (ref 0.450–4.500)

## 2017-11-21 NOTE — Telephone Encounter (Signed)
Yes

## 2017-11-21 NOTE — Telephone Encounter (Signed)
Copied from [place] [id number]. Topic: Quick Communication - See Telephone Encounter >> Nov 21, 2017 10:48 AM Ahmed Prima L wrote: CRM for notification. See Telephone encounter for: 11/21/17.  Skeet Simmer, physical therapy with [facility] called and said that the patient wants to be discharged from physical therapy. He will start the paperwork to discharge him. He will fax something over to Dr Aundra Dubin.

## 2017-11-21 NOTE — Telephone Encounter (Signed)
Spoke with pt regarding paracentesis appointments. Pt is requesting a standing order for monthly paracentesis appointments. I explained that I will consult with Dr. Vicente Males and then advise.

## 2017-11-22 DIAGNOSIS — R69 Illness, unspecified: Secondary | ICD-10-CM | POA: Diagnosis not present

## 2017-11-22 DIAGNOSIS — E1122 Type 2 diabetes mellitus with diabetic chronic kidney disease: Secondary | ICD-10-CM | POA: Diagnosis not present

## 2017-11-22 DIAGNOSIS — N183 Chronic kidney disease, stage 3 (moderate): Secondary | ICD-10-CM | POA: Diagnosis not present

## 2017-11-22 DIAGNOSIS — I129 Hypertensive chronic kidney disease with stage 1 through stage 4 chronic kidney disease, or unspecified chronic kidney disease: Secondary | ICD-10-CM | POA: Diagnosis not present

## 2017-11-22 DIAGNOSIS — K7011 Alcoholic hepatitis with ascites: Secondary | ICD-10-CM | POA: Diagnosis not present

## 2017-11-26 ENCOUNTER — Ambulatory Visit: Payer: Medicare HMO

## 2017-11-27 ENCOUNTER — Other Ambulatory Visit: Payer: Self-pay

## 2017-11-27 DIAGNOSIS — K7031 Alcoholic cirrhosis of liver with ascites: Secondary | ICD-10-CM

## 2017-11-27 DIAGNOSIS — R188 Other ascites: Principal | ICD-10-CM

## 2017-11-27 DIAGNOSIS — K7460 Unspecified cirrhosis of liver: Secondary | ICD-10-CM

## 2017-11-27 NOTE — Telephone Encounter (Signed)
Called pt to inform that Dr. Vicente Males approves  His request for a standing order for monthly paracentesis appointments. Unable to contact. LVM to return call

## 2017-12-03 ENCOUNTER — Ambulatory Visit: Payer: Medicare HMO

## 2017-12-04 ENCOUNTER — Ambulatory Visit: Payer: Medicare HMO | Admitting: Oncology

## 2017-12-04 ENCOUNTER — Ambulatory Visit: Payer: Medicare HMO

## 2017-12-04 ENCOUNTER — Telehealth: Payer: Self-pay | Admitting: *Deleted

## 2017-12-04 ENCOUNTER — Encounter: Payer: Medicare HMO | Admitting: Hospice and Palliative Medicine

## 2017-12-04 ENCOUNTER — Other Ambulatory Visit: Payer: Medicare HMO

## 2017-12-04 NOTE — Telephone Encounter (Signed)
Patient called reporting that he was originally to have chemotherapy this morning, but it and his scan got cancelled and he was told he would get a call later for an appointment. He is asking that someone call him and explain what is going on [phone]

## 2017-12-04 NOTE — Telephone Encounter (Signed)
We had been waiting on approval from insurance company, will follow up today and get patient updated appointments.

## 2017-12-04 NOTE — Telephone Encounter (Signed)
Patient informed we are awaiting insurance approval

## 2017-12-09 DIAGNOSIS — N183 Chronic kidney disease, stage 3 (moderate): Secondary | ICD-10-CM | POA: Diagnosis not present

## 2017-12-09 DIAGNOSIS — E871 Hypo-osmolality and hyponatremia: Secondary | ICD-10-CM | POA: Diagnosis not present

## 2017-12-09 DIAGNOSIS — I10 Essential (primary) hypertension: Secondary | ICD-10-CM | POA: Diagnosis not present

## 2017-12-10 ENCOUNTER — Ambulatory Visit
Admission: RE | Admit: 2017-12-10 | Discharge: 2017-12-10 | Disposition: A | Discharge status: Home / Self Care | Payer: Medicare HMO | Source: Ambulatory Visit | Attending: Internal Medicine | Admitting: Internal Medicine

## 2017-12-10 DIAGNOSIS — K7031 Alcoholic cirrhosis of liver with ascites: Secondary | ICD-10-CM | POA: Insufficient documentation

## 2017-12-10 DIAGNOSIS — R188 Other ascites: Secondary | ICD-10-CM

## 2017-12-10 DIAGNOSIS — K7460 Unspecified cirrhosis of liver: Secondary | ICD-10-CM

## 2017-12-10 DIAGNOSIS — R69 Illness, unspecified: Secondary | ICD-10-CM | POA: Diagnosis not present

## 2017-12-10 MED ORDER — ALBUMIN HUMAN 25 % IV SOLN
25.0000 g | Freq: Once | INTRAVENOUS | Status: AC
  Administered 2017-12-10: 16:00:00 via INTRAVENOUS

## 2017-12-10 MED ORDER — ALBUMIN HUMAN 25 % IV SOLN
INTRAVENOUS | Status: AC
  Filled 2017-12-10: qty 100

## 2017-12-10 NOTE — Procedures (Signed)
Pre Procedural Dx: Symptomatic Ascites Post Procedural Dx: Same  Successful US guided paracentesis yielding 5.4 L of serous ascitic fluid.  EBL: None Complications: None immediate  Jay Darria Corvera, MD Pager #: [phone]   

## 2017-12-12 ENCOUNTER — Emergency Department: Payer: Medicare HMO

## 2017-12-12 ENCOUNTER — Encounter: Payer: Self-pay | Admitting: Emergency Medicine

## 2017-12-12 ENCOUNTER — Other Ambulatory Visit: Payer: Self-pay

## 2017-12-12 ENCOUNTER — Emergency Department
Admission: EM | Admit: 2017-12-12 | Discharge: 2017-12-12 | Disposition: A | Discharge status: Home / Self Care | Payer: Medicare HMO | Attending: Emergency Medicine | Admitting: Emergency Medicine

## 2017-12-12 DIAGNOSIS — K5900 Constipation, unspecified: Secondary | ICD-10-CM | POA: Diagnosis not present

## 2017-12-12 DIAGNOSIS — E1140 Type 2 diabetes mellitus with diabetic neuropathy, unspecified: Secondary | ICD-10-CM | POA: Diagnosis not present

## 2017-12-12 DIAGNOSIS — Z794 Long term (current) use of insulin: Secondary | ICD-10-CM | POA: Insufficient documentation

## 2017-12-12 DIAGNOSIS — N183 Chronic kidney disease, stage 3 (moderate): Secondary | ICD-10-CM | POA: Diagnosis not present

## 2017-12-12 DIAGNOSIS — E1122 Type 2 diabetes mellitus with diabetic chronic kidney disease: Secondary | ICD-10-CM | POA: Insufficient documentation

## 2017-12-12 DIAGNOSIS — I129 Hypertensive chronic kidney disease with stage 1 through stage 4 chronic kidney disease, or unspecified chronic kidney disease: Secondary | ICD-10-CM | POA: Insufficient documentation

## 2017-12-12 DIAGNOSIS — E113399 Type 2 diabetes mellitus with moderate nonproliferative diabetic retinopathy without macular edema, unspecified eye: Secondary | ICD-10-CM | POA: Insufficient documentation

## 2017-12-12 DIAGNOSIS — Z87891 Personal history of nicotine dependence: Secondary | ICD-10-CM | POA: Insufficient documentation

## 2017-12-12 DIAGNOSIS — R1030 Lower abdominal pain, unspecified: Secondary | ICD-10-CM | POA: Diagnosis not present

## 2017-12-12 DIAGNOSIS — Z79899 Other long term (current) drug therapy: Secondary | ICD-10-CM | POA: Diagnosis not present

## 2017-12-12 DIAGNOSIS — E876 Hypokalemia: Secondary | ICD-10-CM

## 2017-12-12 DIAGNOSIS — R110 Nausea: Secondary | ICD-10-CM | POA: Diagnosis not present

## 2017-12-12 DIAGNOSIS — I2510 Atherosclerotic heart disease of native coronary artery without angina pectoris: Secondary | ICD-10-CM | POA: Insufficient documentation

## 2017-12-12 DIAGNOSIS — K769 Liver disease, unspecified: Secondary | ICD-10-CM | POA: Diagnosis not present

## 2017-12-12 DIAGNOSIS — E039 Hypothyroidism, unspecified: Secondary | ICD-10-CM | POA: Insufficient documentation

## 2017-12-12 DIAGNOSIS — K7460 Unspecified cirrhosis of liver: Secondary | ICD-10-CM | POA: Diagnosis not present

## 2017-12-12 LAB — CBC WITH DIFFERENTIAL/PLATELET
Abs Immature Granulocytes: 0.01 10*3/uL (ref 0.00–0.07)
Basophils Absolute: 0 10*3/uL (ref 0.0–0.1)
Basophils Relative: 0 %
EOS ABS: 0.1 10*3/uL (ref 0.0–0.5)
Eosinophils Relative: 4 %
HCT: 25.8 % — ABNORMAL LOW (ref 39.0–52.0)
Hemoglobin: 8.1 g/dL — ABNORMAL LOW (ref 13.0–17.0)
IMMATURE GRANULOCYTES: 0 %
Lymphocytes Relative: 15 %
Lymphs Abs: 0.4 10*3/uL — ABNORMAL LOW (ref 0.7–4.0)
MCH: 26.4 pg (ref 26.0–34.0)
MCHC: 31.4 g/dL (ref 30.0–36.0)
MCV: 84 fL (ref 80.0–100.0)
MONOS PCT: 8 %
Monocytes Absolute: 0.2 10*3/uL (ref 0.1–1.0)
NEUTROS ABS: 1.9 10*3/uL (ref 1.7–7.7)
NRBC: 0 % (ref 0.0–0.2)
Neutrophils Relative %: 73 %
Platelets: 127 10*3/uL — ABNORMAL LOW (ref 150–400)
RBC: 3.07 MIL/uL — ABNORMAL LOW (ref 4.22–5.81)
RDW: 13.7 % (ref 11.5–15.5)
WBC: 2.6 10*3/uL — AB (ref 4.0–10.5)

## 2017-12-12 LAB — COMPREHENSIVE METABOLIC PANEL
ALK PHOS: 259 U/L — AB (ref 38–126)
ALT: 17 U/L (ref 0–44)
ANION GAP: 8 (ref 5–15)
AST: 27 U/L (ref 15–41)
Albumin: 2.2 g/dL — ABNORMAL LOW (ref 3.5–5.0)
BILIRUBIN TOTAL: 1.9 mg/dL — AB (ref 0.3–1.2)
BUN: 39 mg/dL — ABNORMAL HIGH (ref 8–23)
CALCIUM: 7.9 mg/dL — AB (ref 8.9–10.3)
CHLORIDE: 100 mmol/L (ref 98–111)
CO2: 26 mmol/L (ref 22–32)
CREATININE: 1.48 mg/dL — AB (ref 0.61–1.24)
GFR, EST AFRICAN AMERICAN: 58 mL/min — AB (ref >60)
GFR, EST NON AFRICAN AMERICAN: 50 mL/min — AB (ref >60)
GLUCOSE: 237 mg/dL — AB (ref 70–99)
Potassium: 2.8 mmol/L — ABNORMAL LOW (ref 3.5–5.1)
Sodium: 134 mmol/L — ABNORMAL LOW (ref 135–145)
TOTAL PROTEIN: 6.7 g/dL (ref 6.5–8.1)

## 2017-12-12 LAB — LIPASE, BLOOD: Lipase: 42 U/L (ref 11–51)

## 2017-12-12 MED ORDER — POLYETHYLENE GLYCOL 3350 17 G PO PACK: 17.0000 g | PACK | Freq: Every day | ORAL | 0 refills | Status: AC

## 2017-12-12 MED ORDER — POTASSIUM CHLORIDE CRYS ER 20 MEQ PO TBCR
40.0000 meq | EXTENDED_RELEASE_TABLET | Freq: Once | ORAL | Status: AC
  Administered 2017-12-12: 40 meq via ORAL
  Filled 2017-12-12: qty 2

## 2017-12-12 MED ORDER — POTASSIUM CHLORIDE ER 10 MEQ PO TBCR: 20.0000 meq | EXTENDED_RELEASE_TABLET | Freq: Every day | ORAL | 0 refills | Status: DC

## 2017-12-12 NOTE — ED Provider Notes (Addendum)
[facility] Emergency Department Provider Note  ____________________________________________   I have reviewed the triage vital signs and the nursing notes. Where available I have reviewed prior notes and, if possible and indicated, outside hospital notes.    HISTORY  Chief Complaint Fecal Impaction and Abdominal Pain    HPI Rodney Stewart is a 62 y.o. male with a history of end-stage liver disease, cirrhosis, states he does no longer drink alcohol, has been on lactulose but no longer takes it because he does not like what it does to his stomach he states, did have 6 L of ascites pulled off a couple days ago, presents today with constipation.  Patient states his been constipated for 3 days or so.  He states he tried Senokot at home with no results.  He needs to not be constipated as often when he took the lactulose but since he stopped taking lactulose he feels that his constipation is more of an issue.  He denies significant abdominal pain fever vomiting.  He states is uncomfortable to be constipated.  He has had no other complaints or symptoms.  No distraction symptoms such as nausea or vomiting he is taking p.o. he states     Past Medical History:  Diagnosis Date  . Anemia   . Ascites   . BPH (benign prostatic hyperplasia)   . Cirrhosis ([place])   . CKD (chronic kidney disease) stage 3, GFR 30-59 ml/min (HCC)   . Clear cell renal cell carcinoma ([place]) 2014   Left Nephrectomy.  . Colon polyps   . Diabetes mellitus without complication ([place])    type 2   . Dyspnea    with exertion  . GERD (gastroesophageal reflux disease)   . History of gout   . History of nephrectomy    Left  . Hypertension   . Neuropathy   . Renal Cancer    Renal Cancer  . Renal cell carcinoma of left kidney (HCC)    mets to lungs  . Umbilical hernia     Patient Active Problem List   Diagnosis Date Noted  . Confusion 11/06/2017  . Altered mental status 11/06/2017  . Type 2  diabetes mellitus with diabetic polyneuropathy, with long-term current use of insulin ([place]) 11/06/2017  . Hypoalbuminemia 10/18/2017  . Hypothyroidism 10/03/2017  . Bilateral leg edema 10/03/2017  . Hydrocele 10/03/2017  . Protein-calorie malnutrition, severe 09/15/2017  . Ascites   . Hyponatremia 05/28/2017  . Colon polyps 04/26/2017  . Umbilical hernia 70/01/7492  . Left inguinal hernia 04/26/2017  . Gout 04/26/2017  . Fatty liver 04/26/2017  . Leukopenia 04/26/2017  . Gallstones 04/26/2017  . Type 2 diabetes mellitus with diabetic neuropathy, unspecified ([place]) 04/26/2017  . CAD (coronary artery disease) 04/26/2017  . Chronic knee pain 04/26/2017  . Protein-calorie malnutrition ([place]) 04/26/2017  . Cirrhosis ([place]) 02/17/2017  . Neutropenia associated with infection ([place])   . Malignant neoplasm of kidney ([place])   . Thrombocytopenia ([place])   . Iron deficiency anemia 01/23/2017  . Goals of care, counseling/discussion 11/27/2016  . BPH (benign prostatic hyperplasia) 10/27/2016  . Effusion of left olecranon bursa 10/27/2016  . Foreign body in left ear 10/27/2016  . Pancytopenia ([place]) 10/27/2016  . Alcohol abuse 08/28/2016  . Nonimmune to hepatitis B virus 08/02/2016  . Metastatic renal cell carcinoma to lung ([place])   . ARF (acute renal failure) ([place]) 09/30/2015  . Type 2 diabetes mellitus with moderate nonproliferative diabetic retinopathy and without macular edema ([place]) 09/17/2014  .  Cataract 08/20/2014  . Hypertension 07/02/2014  . CKD (chronic kidney disease) stage 3, GFR 30-59 ml/min (HCC) 11/30/2013  . Single kidney 02/04/2013  . Renal cell carcinoma ([place]) 01/02/2012    Past Surgical History:  Procedure Laterality Date  . BACK SURGERY     ruptured disc 1991   . ENDOBRONCHIAL ULTRASOUND N/A 10/14/2015   Procedure: ENDOBRONCHIAL ULTRASOUND;  Surgeon: [name], MD;  Location: [facility] ORS;  Service: Pulmonary;  Laterality: N/A;  . ESOPHAGOGASTRODUODENOSCOPY (EGD)  WITH PROPOFOL N/A 04/15/2017   Procedure: ESOPHAGOGASTRODUODENOSCOPY (EGD) WITH PROPOFOL;  Surgeon: Jonathon Bellows, MD;  Location: [facility] ENDOSCOPY;  Service: Gastroenterology;  Laterality: N/A;  Screen for esophageal varices  . EYE SURGERY Bilateral    Cataract Extraction with IOL  . IR RADIOLOGIST EVAL & MGMT  10/15/2017  . KNEE SURGERY Right   . NEPHRECTOMY     left kidney 2014 renal cell cancer     Prior to Admission medications   Medication Sig Start Date End Date Taking? Authorizing Provider  allopurinol (ZYLOPRIM) 100 MG tablet Take 1 tablet (100 mg total) by mouth daily. 08/27/17   [name], [name], MD  ALPRAZolam Duanne Moron) 1 MG tablet Take 1 mg by mouth 2 (two) times daily as needed for anxiety or sleep.  06/10/16   [provider]  aspirin EC 81 MG tablet Take 81 mg by mouth daily.    [provider]  doxycycline (VIBRA-TABS) 100 MG tablet Take 1 tablet (100 mg total) by mouth 2 (two) times daily. With food 11/05/17   [name], [name], MD  feeding supplement, ENSURE ENLIVE, (ENSURE ENLIVE) LIQD Take 237 mLs by mouth 3 (three) times daily between meals. 09/18/17   Bettey Costa, MD  furosemide (LASIX) 20 MG tablet Take 1-2 tablets (20-40 mg total) by mouth daily. In am as needed leg edema 11/13/17   [name], [name], MD  gabapentin (NEURONTIN) 100 MG capsule Take 2 capsules (200 mg total) by mouth 3 (three) times daily. Patient taking differently: Take 200 mg by mouth 2 (two) times daily.  04/26/17   [name], [name], MD  insulin aspart (NOVOLOG) 100 UNIT/ML FlexPen Before meals 131-180 2 units, 181-240 4 units, 241-300 6 units, 301-350 8 units, 351-400 10 units, >400 12 units 09/30/17   [name], [name], MD  Insulin Detemir (LEVEMIR FLEXTOUCH) 100 UNIT/ML Pen Inject 18 Units into the skin 2 (two) times daily. With food 11/05/17   [name], [name], MD  Insulin Pen Needle 32G X 4 MM MISC 1 Device by Does not apply route 2 (two) times  daily. E11.9 09/23/17   [name], [name], MD  lactulose (CHRONULAC) 10 GM/15ML solution Take 45 mLs (30 g total) by mouth daily. 11/06/17   Jonathon Bellows, MD  levothyroxine (SYNTHROID, LEVOTHROID) 50 MCG tablet Take 1 tablet (50 mcg total) by mouth daily before breakfast. 30 minutes 10/25/17   [name], [name], MD  lovastatin (MEVACOR) 20 MG tablet Take 1 tablet (20 mg total) by mouth daily at 6 PM. 08/08/17   [name], [name], MD  magic mouthwash w/lidocaine SOLN Take 5 mLs by mouth 3 (three) times daily as needed for mouth pain. Swish and spit 10/30/17   [name], [name], MD  mupirocin ointment (BACTROBAN) 2 % Apply 1 application topically 3 (three) times daily. Left big toe and right back 11/05/17   [name], [name], MD  ONE TOUCH ULTRA TEST test strip Bid 05/28/17   [name], [name], MD  oxyCODONE (ROXICODONE) 5 MG immediate  release tablet Take 1 tablet (5 mg total) by mouth every 6 (six) hours as needed for severe pain or breakthrough pain. 09/25/17   Jacquelin Hawking, NP  potassium chloride SA (K-DUR,KLOR-CON) 20 MEQ tablet Take 1 tablet (20 mEq total) by mouth daily for 3 days. 10/22/17 10/25/17  Dustin Flock, MD  tamsulosin (FLOMAX) 0.4 MG CAPS capsule Take 1 capsule (0.4 mg total) by mouth every other day. With supper 10/07/17   [name], [name], MD  valACYclovir (VALTREX) 1000 MG tablet Take 1 tablet (1,000 mg total) by mouth 2 (two) times daily. X 5-10 days with food 09/23/17   [name], [name], MD    Allergies Patient has no known allergies.  Family History  Problem Relation Age of Onset  . Ovarian cancer Mother   . Diabetes Father   . Hypertension Father     Social History Social History   Tobacco Use  . Smoking status: Former Smoker    Packs/day: 1.00    Types: Cigarettes    Last attempt to quit: 04/15/1993    Years since quitting: 24.6  . Smokeless tobacco: Never Used  Substance Use Topics  . Alcohol use: Not  Currently  . Drug use: No    Review of Systems Constitutional: No fever/chills Eyes: No visual changes. ENT: No sore throat. No stiff neck no neck pain Cardiovascular: Denies chest pain. Respiratory: Denies shortness of breath. Gastrointestinal:   no vomiting.  No diarrhea.  + constipation. Genitourinary: Negative for dysuria. Musculoskeletal: Negative lower extremity swelling Skin: Negative for rash. Neurological: Negative for severe headaches, focal weakness or numbness.   ____________________________________________   PHYSICAL EXAM:  VITAL SIGNS: ED Triage Vitals  Enc Vitals Group     BP 12/12/17 0559 140/84     Pulse Rate 12/12/17 0559 (!) 104     Resp 12/12/17 0559 20     Temp 12/12/17 0559 97.7 F (36.5 C)     Temp Source 12/12/17 0559 Oral     SpO2 12/12/17 0559 100 %     Weight 12/12/17 0555 170 lb (77.1 kg)     Height 12/12/17 0555 5\' 11"  (1.803 m)     Head Circumference --      Peak Flow --      Pain Score 12/12/17 0554 10     Pain Loc --      Pain Edu? --      Excl. in [place]? --     Constitutional: Alert and oriented. Well appearing and in no acute distress. Eyes: Conjunctivae are normal Head: Atraumatic HEENT: No congestion/rhinnorhea. Mucous membranes are moist.  Oropharynx non-erythematous Neck:   Nontender with no meningismus, no masses, no stridor Cardiovascular: Normal rate, regular rhythm. Grossly normal heart sounds.  Good peripheral circulation. Respiratory: Normal respiratory effort.  No retractions. Lungs CTAB. Abdominal: Soft and no ascites noted, slight tenderness. No distention. No guarding no rebound Back:  There is no focal tenderness or step off.  there is no midline tenderness there are no lesions noted. there is no CVA tenderness Musculoskeletal: No lower extremity tenderness, no upper extremity tenderness. No joint effusions, no DVT signs strong distal pulses no edema Neurologic:  Normal speech and language. No gross focal neurologic  deficits are appreciated.  Skin:  Skin is warm, dry and intact. No rash noted. Psychiatric: Mood and affect are normal. Speech and behavior are normal.  ____________________________________________   LABS (all labs ordered are listed, but only abnormal results are displayed)  Labs Reviewed  CBC  WITH DIFFERENTIAL/PLATELET  COMPREHENSIVE METABOLIC PANEL  LIPASE, BLOOD    Pertinent labs  results that were available during my care of the patient were reviewed by me and considered in my medical decision making (see chart for details). ____________________________________________  EKG  I personally interpreted any EKGs ordered by me or triage  ____________________________________________  RADIOLOGY  Pertinent labs & imaging results that were available during my care of the patient were reviewed by me and considered in my medical decision making (see chart for details). If possible, patient and/or family made aware of any abnormal findings.  No results found. ____________________________________________    PROCEDURES  Procedure(s) performed: None  Procedures  Critical Care performed: None  ____________________________________________   INITIAL IMPRESSION / ASSESSMENT AND PLAN / ED COURSE  Pertinent labs & imaging results that were available during my care of the patient were reviewed by me and considered in my medical decision making (see chart for details).  Patient here because he feels constipated after stopping his own to less.  He does not appear to be acutely confused I do not think that we need to check an ammonia level on him.  However, it is concerning that he is constipated.  He does have a long history of this has all sorts of medications at home that he is tried.  We will get an x-ray to make sure there is no evidence of obstruction although he is not vomiting, very low suspicion for SBP or other significant abdominal pathology but given his history of also check  basic blood work as a precaution, and we will reassess.  He is in no acute distress  ----------------------------------------- 8:59 AM on 12/12/2017 -----------------------------------------  Rectal exam shows uncomplicated fecal impaction I was able to express stool digitally from his rectum and he feels somewhat better but I think an enema will help we will give him an enema.  No evidence of obstruction or other complication, he is somewhat hyponatremic, we are giving him potassium here.  Kidney function is otherwise at his baseline blood works otherwise at his baseline  ----------------------------------------- 9:50 AM on 12/12/2017 -----------------------------------------  Patient given an enema had 2 large and satisfying nonbloody bowel movements feels under percent better and wants to go home abdomen remains benign return precautions follow-up given understood.  We will continue treatment as an outpatient for his hypokalemia although given his stage III kidney disease, I would not give him prolonged course.    ____________________________________________   FINAL CLINICAL IMPRESSION(S) / ED DIAGNOSES  Final diagnoses:  None      This chart was dictated using voice recognition software.  Despite best efforts to proofread,  errors can occur which can change meaning.      Schuyler Amor, MD 12/12/17 [id number]    Schuyler Amor, MD 12/12/17 0900    Schuyler Amor, MD 12/12/17 [phone]

## 2017-12-12 NOTE — ED Notes (Signed)
Patient transported to X-ray 

## 2017-12-12 NOTE — ED Triage Notes (Addendum)
Patient ambulatory to triage with slow gait, appears uncomfortable; st he is impacted; no BM in 4 days; c/o lower abd pain with nausea; had paracentesis done 2 days with 6l fluid removed

## 2017-12-17 ENCOUNTER — Ambulatory Visit
Admission: RE | Admit: 2017-12-17 | Discharge: 2017-12-17 | Disposition: A | Discharge status: Home / Self Care | Payer: Medicare HMO | Source: Ambulatory Visit | Attending: Oncology | Admitting: Oncology

## 2017-12-17 DIAGNOSIS — R161 Splenomegaly, not elsewhere classified: Secondary | ICD-10-CM | POA: Insufficient documentation

## 2017-12-17 DIAGNOSIS — R590 Localized enlarged lymph nodes: Secondary | ICD-10-CM | POA: Diagnosis not present

## 2017-12-17 DIAGNOSIS — R64 Cachexia: Secondary | ICD-10-CM | POA: Diagnosis not present

## 2017-12-17 DIAGNOSIS — K8020 Calculus of gallbladder without cholecystitis without obstruction: Secondary | ICD-10-CM | POA: Diagnosis not present

## 2017-12-17 DIAGNOSIS — I700 Atherosclerosis of aorta: Secondary | ICD-10-CM | POA: Insufficient documentation

## 2017-12-17 DIAGNOSIS — C649 Malignant neoplasm of unspecified kidney, except renal pelvis: Secondary | ICD-10-CM | POA: Insufficient documentation

## 2017-12-17 DIAGNOSIS — I2510 Atherosclerotic heart disease of native coronary artery without angina pectoris: Secondary | ICD-10-CM | POA: Diagnosis not present

## 2017-12-17 DIAGNOSIS — N433 Hydrocele, unspecified: Secondary | ICD-10-CM | POA: Insufficient documentation

## 2017-12-17 DIAGNOSIS — C7802 Secondary malignant neoplasm of left lung: Secondary | ICD-10-CM | POA: Insufficient documentation

## 2017-12-17 DIAGNOSIS — R188 Other ascites: Secondary | ICD-10-CM | POA: Insufficient documentation

## 2017-12-17 LAB — GLUCOSE, CAPILLARY: Glucose-Capillary: 113 mg/dL — ABNORMAL HIGH (ref 70–99)

## 2017-12-17 MED ORDER — FLUDEOXYGLUCOSE F - 18 (FDG) INJECTION
8.8000 | Freq: Once | INTRAVENOUS | Status: AC | PRN
  Administered 2017-12-17: 8.5 via INTRAVENOUS

## 2017-12-19 ENCOUNTER — Inpatient Hospital Stay (HOSPITAL_BASED_OUTPATIENT_CLINIC_OR_DEPARTMENT_OTHER): Payer: Medicare HMO | Admitting: Oncology

## 2017-12-19 ENCOUNTER — Inpatient Hospital Stay: Payer: Medicare HMO

## 2017-12-19 ENCOUNTER — Other Ambulatory Visit: Payer: Self-pay

## 2017-12-19 ENCOUNTER — Inpatient Hospital Stay: Payer: Medicare HMO | Attending: Oncology

## 2017-12-19 VITALS — BP 153/88 | HR 96 | Temp 96.8°F | Wt 172.5 lb

## 2017-12-19 DIAGNOSIS — R188 Other ascites: Secondary | ICD-10-CM | POA: Insufficient documentation

## 2017-12-19 DIAGNOSIS — R600 Localized edema: Secondary | ICD-10-CM | POA: Diagnosis not present

## 2017-12-19 DIAGNOSIS — C649 Malignant neoplasm of unspecified kidney, except renal pelvis: Secondary | ICD-10-CM

## 2017-12-19 DIAGNOSIS — K7460 Unspecified cirrhosis of liver: Secondary | ICD-10-CM

## 2017-12-19 DIAGNOSIS — Z905 Acquired absence of kidney: Secondary | ICD-10-CM

## 2017-12-19 DIAGNOSIS — I129 Hypertensive chronic kidney disease with stage 1 through stage 4 chronic kidney disease, or unspecified chronic kidney disease: Secondary | ICD-10-CM | POA: Insufficient documentation

## 2017-12-19 DIAGNOSIS — D61818 Other pancytopenia: Secondary | ICD-10-CM | POA: Diagnosis not present

## 2017-12-19 DIAGNOSIS — Z794 Long term (current) use of insulin: Secondary | ICD-10-CM | POA: Insufficient documentation

## 2017-12-19 DIAGNOSIS — E876 Hypokalemia: Secondary | ICD-10-CM

## 2017-12-19 DIAGNOSIS — K219 Gastro-esophageal reflux disease without esophagitis: Secondary | ICD-10-CM | POA: Insufficient documentation

## 2017-12-19 DIAGNOSIS — N400 Enlarged prostate without lower urinary tract symptoms: Secondary | ICD-10-CM | POA: Diagnosis not present

## 2017-12-19 DIAGNOSIS — E1122 Type 2 diabetes mellitus with diabetic chronic kidney disease: Secondary | ICD-10-CM | POA: Diagnosis not present

## 2017-12-19 DIAGNOSIS — Z5112 Encounter for antineoplastic immunotherapy: Secondary | ICD-10-CM | POA: Insufficient documentation

## 2017-12-19 DIAGNOSIS — N183 Chronic kidney disease, stage 3 (moderate): Secondary | ICD-10-CM | POA: Diagnosis not present

## 2017-12-19 DIAGNOSIS — C7800 Secondary malignant neoplasm of unspecified lung: Principal | ICD-10-CM

## 2017-12-19 DIAGNOSIS — I2510 Atherosclerotic heart disease of native coronary artery without angina pectoris: Secondary | ICD-10-CM | POA: Insufficient documentation

## 2017-12-19 DIAGNOSIS — E871 Hypo-osmolality and hyponatremia: Secondary | ICD-10-CM | POA: Insufficient documentation

## 2017-12-19 DIAGNOSIS — Z7982 Long term (current) use of aspirin: Secondary | ICD-10-CM | POA: Diagnosis not present

## 2017-12-19 DIAGNOSIS — C642 Malignant neoplasm of left kidney, except renal pelvis: Secondary | ICD-10-CM | POA: Insufficient documentation

## 2017-12-19 DIAGNOSIS — C7802 Secondary malignant neoplasm of left lung: Secondary | ICD-10-CM

## 2017-12-19 DIAGNOSIS — K8020 Calculus of gallbladder without cholecystitis without obstruction: Secondary | ICD-10-CM | POA: Diagnosis not present

## 2017-12-19 DIAGNOSIS — C7801 Secondary malignant neoplasm of right lung: Secondary | ICD-10-CM | POA: Diagnosis not present

## 2017-12-19 DIAGNOSIS — M109 Gout, unspecified: Secondary | ICD-10-CM | POA: Insufficient documentation

## 2017-12-19 DIAGNOSIS — Z87891 Personal history of nicotine dependence: Secondary | ICD-10-CM | POA: Diagnosis not present

## 2017-12-19 DIAGNOSIS — Z79899 Other long term (current) drug therapy: Secondary | ICD-10-CM

## 2017-12-19 LAB — COMPREHENSIVE METABOLIC PANEL
ALK PHOS: 323 U/L — AB (ref 38–126)
ALT: 20 U/L (ref 0–44)
AST: 37 U/L (ref 15–41)
Albumin: 2.3 g/dL — ABNORMAL LOW (ref 3.5–5.0)
Anion gap: 10 (ref 5–15)
BUN: 37 mg/dL — AB (ref 8–23)
CALCIUM: 7.7 mg/dL — AB (ref 8.9–10.3)
CHLORIDE: 97 mmol/L — AB (ref 98–111)
CO2: 26 mmol/L (ref 22–32)
CREATININE: 1.59 mg/dL — AB (ref 0.61–1.24)
GFR calc non Af Amer: 46 mL/min — ABNORMAL LOW (ref >60)
GFR, EST AFRICAN AMERICAN: 54 mL/min — AB (ref >60)
Glucose, Bld: 140 mg/dL — ABNORMAL HIGH (ref 70–99)
Potassium: 3.3 mmol/L — ABNORMAL LOW (ref 3.5–5.1)
Sodium: 133 mmol/L — ABNORMAL LOW (ref 135–145)
Total Bilirubin: 1.8 mg/dL — ABNORMAL HIGH (ref 0.3–1.2)
Total Protein: 7.3 g/dL (ref 6.5–8.1)

## 2017-12-19 LAB — CBC WITH DIFFERENTIAL/PLATELET
Abs Immature Granulocytes: 0.01 10*3/uL (ref 0.00–0.07)
Basophils Absolute: 0 10*3/uL (ref 0.0–0.1)
Basophils Relative: 0 %
EOS PCT: 5 %
Eosinophils Absolute: 0.2 10*3/uL (ref 0.0–0.5)
HEMATOCRIT: 24.4 % — AB (ref 39.0–52.0)
Hemoglobin: 7.5 g/dL — ABNORMAL LOW (ref 13.0–17.0)
Immature Granulocytes: 0 %
LYMPHS ABS: 0.4 10*3/uL — AB (ref 0.7–4.0)
Lymphocytes Relative: 14 %
MCH: 24.8 pg — AB (ref 26.0–34.0)
MCHC: 30.7 g/dL (ref 30.0–36.0)
MCV: 80.5 fL (ref 80.0–100.0)
MONO ABS: 0.3 10*3/uL (ref 0.1–1.0)
MONOS PCT: 9 %
NRBC: 0 % (ref 0.0–0.2)
Neutro Abs: 2.1 10*3/uL (ref 1.7–7.7)
Neutrophils Relative %: 72 %
Platelets: 136 10*3/uL — ABNORMAL LOW (ref 150–400)
RBC: 3.03 MIL/uL — ABNORMAL LOW (ref 4.22–5.81)
RDW: 13.9 % (ref 11.5–15.5)
WBC: 3 10*3/uL — ABNORMAL LOW (ref 4.0–10.5)

## 2017-12-19 MED ORDER — SODIUM CHLORIDE 0.9 % IV SOLN
Freq: Once | INTRAVENOUS | Status: AC
  Administered 2017-12-19: 15:00:00 via INTRAVENOUS
  Filled 2017-12-19: qty 250

## 2017-12-19 MED ORDER — SODIUM CHLORIDE 0.9 % IV SOLN
240.0000 mg | Freq: Once | INTRAVENOUS | Status: AC
  Administered 2017-12-19: 240 mg via INTRAVENOUS
  Filled 2017-12-19: qty 24

## 2017-12-19 NOTE — Progress Notes (Signed)
Patient is here today to follow up on his renal cell carcinoma to lung. Patient stated that he had been doing well.

## 2017-12-19 NOTE — Progress Notes (Signed)
[place]  [phone] [phone]) [phone]  ID: Rodney Stewart OB: 1955-12-08  MR#: [id number]  VXB#:[id number]  Patient Care Team: [name], [name], MD as PCP - General (Internal Medicine)  CHIEF COMPLAINT: Stage IV renal cell carcinoma with bilateral lung metastases.  INTERVAL HISTORY: Patient returns to clinic today for further evaluation and continuation of nivolumab.  His performance status seems to be declining secondary to his worsening cirrhosis.  He continues to require periodic paracentesis for his ascites. He does not complain of pain today.  He continues to have significant peripheral neuropathy in both feet secondary to diabetes, but has no other neurologic complaints. He denies any fevers, chills, or recent illnesses. He has a fair appetite.  He denies any chest pain, shortness of breath, cough, or hemoptysis. He has no nausea, vomiting, constipation, or diarrhea. He has no melena or hematochezia. He has no urinary complaints.  Patient offers no further specific complaints today.  REVIEW OF SYSTEMS:   Review of Systems  Constitutional: Negative.  Negative for fever, malaise/fatigue and weight loss.  HENT: Negative.  Negative for congestion.   Eyes: Negative for blurred vision.  Respiratory: Negative.  Negative for cough and shortness of breath.   Cardiovascular: Negative.  Negative for chest pain and leg swelling.  Gastrointestinal: Negative.  Negative for abdominal pain, constipation, diarrhea, nausea and vomiting.  Genitourinary: Negative.  Negative for dysuria.  Musculoskeletal: Positive for joint pain. Negative for back pain.  Skin: Negative.  Negative for rash.  Neurological: Positive for tingling and sensory change. Negative for focal weakness, weakness and headaches.  Psychiatric/Behavioral: Negative.  The patient is not nervous/anxious and does not have insomnia.    As per HPI. Otherwise, a complete review of systems is  negative.   PAST MEDICAL HISTORY: Past Medical History:  Diagnosis Date  . Anemia   . Ascites   . BPH (benign prostatic hyperplasia)   . Cirrhosis ([place])   . CKD (chronic kidney disease) stage 3, GFR 30-59 ml/min (HCC)   . Clear cell renal cell carcinoma ([place]) 2014   Left Nephrectomy.  . Colon polyps   . Diabetes mellitus without complication ([place])    type 2   . Dyspnea    with exertion  . GERD (gastroesophageal reflux disease)   . History of gout   . History of nephrectomy    Left  . Hypertension   . Neuropathy   . Renal Cancer    Renal Cancer  . Renal cell carcinoma of left kidney (HCC)    mets to lungs  . Umbilical hernia     PAST SURGICAL HISTORY: Past Surgical History:  Procedure Laterality Date  . BACK SURGERY     ruptured disc 1991   . ENDOBRONCHIAL ULTRASOUND N/A 10/14/2015   Procedure: ENDOBRONCHIAL ULTRASOUND;  Surgeon: [name], MD;  Location: [facility] ORS;  Service: Pulmonary;  Laterality: N/A;  . ESOPHAGOGASTRODUODENOSCOPY (EGD) WITH PROPOFOL N/A 04/15/2017   Procedure: ESOPHAGOGASTRODUODENOSCOPY (EGD) WITH PROPOFOL;  Surgeon: Jonathon Bellows, MD;  Location: [facility] ENDOSCOPY;  Service: Gastroenterology;  Laterality: N/A;  Screen for esophageal varices  . EYE SURGERY Bilateral    Cataract Extraction with IOL  . IR RADIOLOGIST EVAL & MGMT  10/15/2017  . KNEE SURGERY Right   . NEPHRECTOMY     left kidney 2014 renal cell cancer     FAMILY HISTORY: Family History  Problem Relation Age of Onset  . Ovarian cancer Mother   . Diabetes Father   . Hypertension Father  ADVANCED DIRECTIVES (Y/N):  N  HEALTH MAINTENANCE: Social History   Tobacco Use  . Smoking status: Former Smoker    Packs/day: 1.00    Types: Cigarettes    Last attempt to quit: 04/15/1993    Years since quitting: 24.6  . Smokeless tobacco: Never Used  Substance Use Topics  . Alcohol use: Not Currently  . Drug use: No     Colonoscopy:  PAP:  Bone density:  Lipid  panel:  No Known Allergies  Current Outpatient Medications  Medication Sig Dispense Refill  . allopurinol (ZYLOPRIM) 100 MG tablet Take 1 tablet (100 mg total) by mouth daily. 90 tablet 3  . ALPRAZolam (XANAX) 1 MG tablet Take 1 mg by mouth 2 (two) times daily as needed for anxiety or sleep.     Marland Kitchen aspirin EC 81 MG tablet Take 81 mg by mouth daily.    Marland Kitchen doxycycline (VIBRA-TABS) 100 MG tablet Take 1 tablet (100 mg total) by mouth 2 (two) times daily. With food 20 tablet 0  . feeding supplement, ENSURE ENLIVE, (ENSURE ENLIVE) LIQD Take 237 mLs by mouth 3 (three) times daily between meals. 237 mL 12  . furosemide (LASIX) 20 MG tablet Take 1-2 tablets (20-40 mg total) by mouth daily. In am as needed leg edema 60 tablet 2  . gabapentin (NEURONTIN) 100 MG capsule Take 2 capsules (200 mg total) by mouth 3 (three) times daily. (Patient taking differently: Take 200 mg by mouth 2 (two) times daily. ) 360 capsule 5  . insulin aspart (NOVOLOG) 100 UNIT/ML FlexPen Before meals 131-180 2 units, 181-240 4 units, 241-300 6 units, 301-350 8 units, 351-400 10 units, >400 12 units 15 mL 11  . Insulin Detemir (LEVEMIR FLEXTOUCH) 100 UNIT/ML Pen Inject 18 Units into the skin 2 (two) times daily. With food 12 pen 12  . Insulin Pen Needle 32G X 4 MM MISC 1 Device by Does not apply route 2 (two) times daily. E11.9 200 each 12  . levothyroxine (SYNTHROID, LEVOTHROID) 50 MCG tablet Take 1 tablet (50 mcg total) by mouth daily before breakfast. 30 minutes 90 tablet 0  . lovastatin (MEVACOR) 20 MG tablet Take 1 tablet (20 mg total) by mouth daily at 6 PM. 90 tablet 3  . magic mouthwash w/lidocaine SOLN Take 5 mLs by mouth 3 (three) times daily as needed for mouth pain. Swish and spit 250 mL 0  . mupirocin ointment (BACTROBAN) 2 % Apply 1 application topically 3 (three) times daily. Left big toe and right back 30 g 1  . ONE TOUCH ULTRA TEST test strip Bid 180 each 3  . oxyCODONE (ROXICODONE) 5 MG immediate release tablet Take  1 tablet (5 mg total) by mouth every 6 (six) hours as needed for severe pain or breakthrough pain. 45 tablet 0  . polyethylene glycol (MIRALAX) packet Take 17 g by mouth daily. 14 each 0  . potassium chloride (K-DUR) 10 MEQ tablet Take 2 tablets (20 mEq total) by mouth daily. 5 tablet 0  . potassium chloride SA (K-DUR,KLOR-CON) 20 MEQ tablet Take 1 tablet (20 mEq total) by mouth daily for 3 days. 3 tablet 0  . tamsulosin (FLOMAX) 0.4 MG CAPS capsule Take 1 capsule (0.4 mg total) by mouth every other day. With supper 90 capsule 3  . lactulose (CHRONULAC) 10 GM/15ML solution Take 45 mLs (30 g total) by mouth daily. (Patient not taking: Reported on 12/19/2017) 1800 mL 2   No current facility-administered medications for this visit.  OBJECTIVE: Vitals:   12/19/17 1346  BP: (!) 153/88  Pulse: 96  Temp: (!) 96.8 F (36 C)  SpO2: 96%     Body mass index is 24.06 kg/m.    ECOG FS:0 - Asymptomatic  General: Ill-appearing, no acute distress. Eyes: Pink conjunctiva, anicteric sclera. HEENT: Normocephalic, moist mucous membranes, clear oropharnyx. Lungs: Clear to auscultation bilaterally. Heart: Regular rate and rhythm. No rubs, murmurs, or gallops. Abdomen: Distended, nontender.   Musculoskeletal: No edema, cyanosis, or clubbing. Neuro: Alert, answering all questions appropriately. Cranial nerves grossly intact. Skin: No rashes or petechiae noted. Psych: Normal affect.  LAB RESULTS:  Lab Results  Component Value Date   NA 133 (L) 12/19/2017   K 3.3 (L) 12/19/2017   CL 97 (L) 12/19/2017   CO2 26 12/19/2017   GLUCOSE 140 (H) 12/19/2017   BUN 37 (H) 12/19/2017   CREATININE 1.59 (H) 12/19/2017   CALCIUM 7.7 (L) 12/19/2017   PROT 7.3 12/19/2017   ALBUMIN 2.3 (L) 12/19/2017   AST 37 12/19/2017   ALT 20 12/19/2017   ALKPHOS 323 (H) 12/19/2017   BILITOT 1.8 (H) 12/19/2017   GFRNONAA 46 (L) 12/19/2017   GFRAA 54 (L) 12/19/2017    Lab Results  Component Value Date   WBC 3.0 (L)  12/19/2017   NEUTROABS 2.1 12/19/2017   HGB 7.5 (L) 12/19/2017   HCT 24.4 (L) 12/19/2017   MCV 80.5 12/19/2017   PLT 136 (L) 12/19/2017   Lab Results  Component Value Date   IRON 55 09/15/2017   TIBC 243 (L) 09/15/2017   IRONPCTSAT 23 09/15/2017   Lab Results  Component Value Date   FERRITIN 54 09/15/2017     STUDIES: Nm Pet Image Restag (ps) Skull Base To Thigh  Result Date: 12/17/2017 CLINICAL DATA:  Subsequent treatment strategy for metastatic renal cell carcinoma. EXAM: NUCLEAR MEDICINE PET SKULL BASE TO THIGH TECHNIQUE: 8.5 mCi F-18 FDG was injected intravenously. Full-ring PET imaging was performed from the skull base to thigh after the radiotracer. CT data was obtained and used for attenuation correction and anatomic localization. Fasting blood glucose: 113 mg/dl COMPARISON:  Multiple exams, including PET-CT from 10/15/2016 and CT scans from 09/20/2017 and 06/07/2017 FINDINGS: Mediastinal blood pool activity: SUV max 1.2 NECK: No significant abnormal hypermetabolic activity in this region. Incidental CT findings: Atherosclerotic calcification of the carotid bulbs. CHEST: 2.7 by 2.2 cm left lower lobe pulmonary nodule has a maximum SUV of 1.8 (formerly 2.4 on 10/15/2016) and previously measured 2.7 by 2.4 cm on 09/20/2017. Right eccentric subcarinal lymph node measures 2.5 cm in short axis on image 93/3 (formerly 2.4 cm by my measurements on 06/07/2017) and has a maximum SUV of 3.0 (formerly 2.7). Indistinctly marginated confluent left hilar adenopathy has a maximum SUV of 2.0 (formerly 2.6). A right internal mammary lymph node measuring 1.0 cm in short axis on image 85/3 (formerly 0.5 cm on 06/07/2017) has a maximum SUV of 2.7 (formerly not hypermetabolic). The scattered bilateral pulmonary nodule 7 minimal faint activity and are similar to those shown on 06/07/2017. Similarly there is very low-grade activity associated with the right hilar adenopathy. Small left and trace right pleural  effusions are significantly increased from 09/20/2017, but nonspecific for transudative versus exudative etiology. Incidental CT findings: Coronary, aortic arch, and branch vessel atherosclerotic vascular disease. Cachexia. Diffuse subcutaneous edema. ABDOMEN/PELVIS: Prominent ascites with marginal accentuated activity favoring malignant ascites. Low-grade activity in infiltrative mesenteric and omental tissues. While much of this may be from extensive third spacing of  fluid, I can not exclude malignant ascites. A representative section of the left upper quadrant omentum has a maximum SUV of 2.6. Fluid and thickening extend through a left indirect inguinal hernia into the left groin and scrotum, with a maximum SUV of 2.7. There is also a separate notable left hydrocele. Incidental CT findings: Cholelithiasis. Aortoiliac atherosclerotic vascular disease. Prominent splenomegaly. Left nephrectomy. No dilated bowel. Diffuse subcutaneous edema. SKELETON: No significant abnormal hypermetabolic activity in this region. Incidental CT findings: IMPRESSION: 1. In the chest, some areas of malignancy including the dominant left lower lobe pulmonary nodule and the confluent left hilar adenopathy are mildly reduced in maximum SUV compared to the prior exam, and still have low-grade activity. However, other areas such as the right eccentric subcarinal lymph node and new right internal mammary adenopathy are increased in metabolic activity. 2. Progressive diffuse ascites and diffuse mesenteric and omental edema. There is a thick and hypermetabolic rind associated with the left inguinal hydrocele. There is also some low-grade activity along peritoneal margins and in the omentum. Given the generally low-grade nature of the tumor to start with, I can not exclude peritoneal spread of malignancy. Correlate with any pathology from the paracenteses. If not already performed it may be prudent to send paracentesis fluid for pathology  analysis. 3. Cachexia. 4.  Aortic Atherosclerosis (ICD10-I70.0).  Coronary atherosclerosis. 5. Cholelithiasis. 6. Prominent splenomegaly. Electronically Signed   By: Van Clines M.D.   On: 12/17/2017 15:57   US Paracentesis  Result Date: 12/10/2017 INDICATION: History of multiple medical problems including stage IV renal cell carcinoma, post left-sided nephrectomy (May 2014), metastatic to the lung with decompensated cirrhosis and recurrent ascites and chronic kidney disease (stage III) who presents today for repeat ultrasound-guided paracentesis for therapeutic purposes. EXAM: ULTRASOUND-GUIDED PARACENTESIS COMPARISON:  Previous ultrasound-guided paracenteses most recently on 11/15/2017 yielding 6 L of peritoneal fluid.; CT abdomen and pelvis - 09/20/2017 MEDICATIONS: None. COMPLICATIONS: None immediate. TECHNIQUE: Informed written consent was obtained from the patient after a discussion of the risks, benefits and alternatives to treatment. A timeout was performed prior to the initiation of the procedure. Initial ultrasound scanning demonstrates a moderate to large amount of ascites within the right lower abdominal quadrant. The right lower abdomen was prepped and draped in the usual sterile fashion. 1% lidocaine with epinephrine was used for local anesthesia. An ultrasound image was saved for documentation purposed. An 8 Fr Safe-T-Centesis catheter was introduced. The paracentesis was performed. The catheter was removed and a dressing was applied. The patient tolerated the procedure well without immediate post procedural complication. FINDINGS: A total of approximately 5.4 liters of serous fluid was removed. IMPRESSION: Successful ultrasound-guided paracentesis yielding 5.4 liters of peritoneal fluid. Electronically Signed   By: Sandi Mariscal M.D.   On: 12/10/2017 15:27   Dg Abdomen Acute W/chest  Result Date: 12/12/2017 CLINICAL DATA:  Lower abdominal pain and nausea. No bowel movement in 4 days.  EXAM: DG ABDOMEN ACUTE W/ 1V CHEST COMPARISON:  Chest x-ray 10/18/2017 and CT abdomen/pelvis 09/20/2017 FINDINGS: The upright chest x-ray demonstrates normal cardiomediastinal silhouette. The pulmonary hila appear normal in stable. No infiltrates or edema. There is a small left pleural effusion which appears stable. Two views of the abdomen demonstrate scattered stool in the colon and down into the rectum. There are few air-filled loops of small bowel but no distension or air-fluid levels to suggest obstruction. No free air. Overall increased density of the abdomen, poor definition of the soft tissue structures and centralization of the bowel  suggests ascites. IMPRESSION: 1. No acute cardiopulmonary findings. There is a small persistent left pleural effusion. 2. Suspect abdominal ascites. 3. Scattered stool in the colon and down into the rectum. No findings for small bowel obstruction or free air. Electronically Signed   By: Marijo Sanes M.D.   On: 12/12/2017 08:43     Oncology history: Patient underwent left nephrectomy on May 16, 2012 which revealed a clear cell grade 2 renal cell carcinoma, stage TIIIa, N0, M0. Tumor size of 16 cm. Patient was noted to have renal vein involvement, but no other structures were involved. 0 of 2 lymph nodes were negative for disease. PET scan on October 20, 2015 revealed metastatic disease and patient was initiated on Votrient. This was subsequently discontinued in March 2018 secondary to progression of disease. Patient initiated second line treatment with nivolumab on Monday, April 02, 2016  ASSESSMENT: Stage IV left renal cell carcinoma with metastasis to the lungs  PLAN:    1. Stage IV left renal cell carcinoma with metastasis to the lungs: PET scan results from December 17, 2017 reviewed independently and report as above with what appears to be a mixed response to therapy.  Overall, patient appears to have stable disease.  His abdomen is difficult to assess given the  diffuse ascites and omental edema.  We will continue with nivolumab as scheduled.  He will receive a flat dose of 240 mg every 2 weeks until intolerable side effects or significant progression of disease.  Return to clinic in 2 weeks for treatment only and then in 4 weeks for further evaluation and continuation of nivolumab.  Continue follow-up with palliative care as needed. 2.  Anemia: Patient's hemoglobin continues to trend down is now 7.5.  Monitor.   3.  Pancytopenia: Chronic and unchanged.  Secondary to cirrhosis. 4.  Renal insufficiency: Patient's creatinine is elevated, but relatively unchanged at 1.59.  Monitor. 5.  Peripheral neuropathy: Chronic and unchanged.  Likely secondary to diabetes, continue current dose of gabapentin.  Continue evaluation and treatment per primary care. 6.  Cirrhosis/ascites: Appreciate GI input.  Patient continues to require periodic paracentesis and is being evaluated for TIPS procedure.   7. Hyperbilirubinemia: Chronic and relatively unchanged.  Secondary to cirrhosis.  Patient bilirubin is stable at 1.8.   8.  Hyponatremia: Decreased, but improved to 133.  Continue treatment and management per GI. 9.  Hypokalemia: Improved, monitor.  Continue oral potassium supplementation.   Patient expressed understanding and was in agreement with this plan. He also understands that He can call clinic at any time with any questions, concerns, or complaints.   Cancer Staging Metastatic renal cell carcinoma to lung [facility]) Staging form: Kidney, AJCC 7th Edition - Clinical stage from 10/12/2015: Stage IV ([place], N0, M1) - Signed by Lloyd Huger, MD on 10/12/2015   Lloyd Huger, MD 12/20/17 2:14 PM

## 2017-12-20 LAB — THYROID PANEL WITH TSH
Free Thyroxine Index: 1.7 (ref 1.2–4.9)
T3 Uptake Ratio: 29 % (ref 24–39)
T4 TOTAL: 5.7 ug/dL (ref 4.5–12.0)
TSH: 9.47 u[IU]/mL — ABNORMAL HIGH (ref 0.450–4.500)

## 2017-12-23 ENCOUNTER — Ambulatory Visit (INDEPENDENT_AMBULATORY_CARE_PROVIDER_SITE_OTHER): Payer: Medicare HMO | Admitting: Gastroenterology

## 2017-12-23 ENCOUNTER — Encounter: Payer: Self-pay | Admitting: Gastroenterology

## 2017-12-23 VITALS — BP 125/75 | HR 99 | Ht 71.0 in | Wt 175.4 lb

## 2017-12-23 DIAGNOSIS — K7031 Alcoholic cirrhosis of liver with ascites: Secondary | ICD-10-CM | POA: Diagnosis not present

## 2017-12-23 DIAGNOSIS — R69 Illness, unspecified: Secondary | ICD-10-CM | POA: Diagnosis not present

## 2017-12-23 NOTE — Progress Notes (Addendum)
Jonathon Bellows MD, MRCP(U.K) [address]  [place]  [place], [place] [address]  Main: [phone]  Fax: [phone]   Primary Care Physician: [name], [name], MD  Primary Gastroenterologist:  Dr. Jonathon Bellows   No chief complaint on file.   HPI: Rodney Stewart is a 62 y.o. male    Summary of history : Cirrhosis diagnosed in early 2019. He has a history of stage 4 RCC , b/l lung metastasis . CKD 3 and Diabetes Mellitus , on chemotherapy. He has pancytopenia and follows with Dr Grayland Ormond.  CT scan of the abdomen done on 02/18/2017 showed subcarinal lymphadenopathy. Multiple stable bilateral pulmonary nodules. Status post left nephrectomy. Liver features suggestive of cirrhosis splenomegaly recanalization of the paraumbilical vein. Large volume ascites. Cholelithiasis. Labs 02/19/17 : Immune to hep A. HCv ab -negative, Hep B e antigen positive , hep b c ab,HIV,AMA,Factin,ceruloplasmin -negtive. PTH normal. A1AT -normal , TSH elevated. Cr 1.37 , GFR 55, alk phos 293, ALT 16 , AST 39 , Ascites - no SBP,SAAG-suggetsive of portal HTN.  He has consumed 6-7 beers a day x many years atleast 30 years Hbsag, Hep B viral load -negative EGD 04/15/17- PHG noted no varices.  Referred to [facility] and they suggested if Hbsag is negative no treatment for Delta antigen status   06/07/17- Ct abdomen and chest shows disease progression , cirrhosis and large volume ascites. Chololithiasis, left inguinal hernias  He was admitted on 09/03/2017 and discharged on 09/18/2017 at the hospital.  Main reason for admission was acute hyponatremia ascites and protein calorie malnutrition that has been severe.  Plan was for outpatient palliative care follow-up for stage IV renal cell carcinoma with metastasis.  CT scan of the abdomen on 09/20/2017 shows moderate to large volume ascites despite recent paracentesis.  Cirrhosis of the liver with splenomegaly.  Wedge-shaped perfusion anomaly of the medial  segment of the left hepatic lobe prior mass cannot be entirely excluded.  Stable pulmonary nodules with dominant mass in the left lower lobe.  Large multiloculated fluid containing ventral hernia with herniation of omental vessels.   Interval history9/23/19 -12/23/17   Follows with palliative care.Seen by Urology for large hydrocele, cannot do much due to large ascites.   Labs 09/20/2017 showed an albumin of 2.6, creatinine of 1.15.  Hemoglobin 12.7 g 3 days back.  INR 10 days back was 1.15.  Not a candidate for TIPS due to high MELD score20- [facility] radiology felt too high for the procedure  Not on any diuretics.  Not confused, has 1 soft bowel movement a day - normal     Current Outpatient Medications  Medication Sig Dispense Refill  . allopurinol (ZYLOPRIM) 100 MG tablet Take 1 tablet (100 mg total) by mouth daily. 90 tablet 3  . ALPRAZolam (XANAX) 1 MG tablet Take 1 mg by mouth 2 (two) times daily as needed for anxiety or sleep.     Marland Kitchen aspirin EC 81 MG tablet Take 81 mg by mouth daily.    Marland Kitchen doxycycline (VIBRA-TABS) 100 MG tablet Take 1 tablet (100 mg total) by mouth 2 (two) times daily. With food 20 tablet 0  . feeding supplement, ENSURE ENLIVE, (ENSURE ENLIVE) LIQD Take 237 mLs by mouth 3 (three) times daily between meals. 237 mL 12  . furosemide (LASIX) 20 MG tablet Take 1-2 tablets (20-40 mg total) by mouth daily. In am as needed leg edema 60 tablet 2  . gabapentin (NEURONTIN) 100 MG capsule Take 2 capsules (200 mg total)  by mouth 3 (three) times daily. (Patient taking differently: Take 200 mg by mouth 2 (two) times daily. ) 360 capsule 5  . insulin aspart (NOVOLOG) 100 UNIT/ML FlexPen Before meals 131-180 2 units, 181-240 4 units, 241-300 6 units, 301-350 8 units, 351-400 10 units, >400 12 units 15 mL 11  . Insulin Detemir (LEVEMIR FLEXTOUCH) 100 UNIT/ML Pen Inject 18 Units into the skin 2 (two) times daily. With food 12 pen 12  . Insulin Pen Needle 32G X 4 MM MISC 1 Device by  Does not apply route 2 (two) times daily. E11.9 200 each 12  . lactulose (CHRONULAC) 10 GM/15ML solution Take 45 mLs (30 g total) by mouth daily. (Patient not taking: Reported on 12/19/2017) 1800 mL 2  . levothyroxine (SYNTHROID, LEVOTHROID) 50 MCG tablet Take 1 tablet (50 mcg total) by mouth daily before breakfast. 30 minutes 90 tablet 0  . lovastatin (MEVACOR) 20 MG tablet Take 1 tablet (20 mg total) by mouth daily at 6 PM. 90 tablet 3  . magic mouthwash w/lidocaine SOLN Take 5 mLs by mouth 3 (three) times daily as needed for mouth pain. Swish and spit 250 mL 0  . mupirocin ointment (BACTROBAN) 2 % Apply 1 application topically 3 (three) times daily. Left big toe and right back 30 g 1  . ONE TOUCH ULTRA TEST test strip Bid 180 each 3  . oxyCODONE (ROXICODONE) 5 MG immediate release tablet Take 1 tablet (5 mg total) by mouth every 6 (six) hours as needed for severe pain or breakthrough pain. 45 tablet 0  . polyethylene glycol (MIRALAX) packet Take 17 g by mouth daily. 14 each 0  . potassium chloride (K-DUR) 10 MEQ tablet Take 2 tablets (20 mEq total) by mouth daily. 5 tablet 0  . potassium chloride SA (K-DUR,KLOR-CON) 20 MEQ tablet Take 1 tablet (20 mEq total) by mouth daily for 3 days. 3 tablet 0  . tamsulosin (FLOMAX) 0.4 MG CAPS capsule Take 1 capsule (0.4 mg total) by mouth every other day. With supper 90 capsule 3   No current facility-administered medications for this visit.     Allergies as of 12/23/2017  . (No Known Allergies)    ROS:  General: Negative for anorexia, weight loss, fever, chills, fatigue, weakness. ENT: Negative for hoarseness, difficulty swallowing , nasal congestion. CV: Negative for chest pain, angina, palpitations, dyspnea on exertion, peripheral edema.  Respiratory: Negative for dyspnea at rest, dyspnea on exertion, cough, sputum, wheezing.  GI: See history of present illness. GU:  Negative for dysuria, hematuria, urinary incontinence, urinary frequency,  nocturnal urination.  Endo: Negative for unusual weight change.    Physical Examination:   There were no vitals taken for this visit.  General: Well-nourished, well-developed in no acute distress.  Eyes: No icterus. Conjunctivae pink. Mouth: Oropharyngeal mucosa moist and pink , no lesions erythema or exudate. Lungs: Clear to auscultation bilaterally. Non-labored. Heart: Regular rate and rhythm, no murmurs rubs or gallops.  Abdomen: Bowel sounds are normal, nontender,distended, free fluid +, no hepatosplenomegaly or masses, no abdominal bruits or hernia , no rebound or guarding.   Extremities: No lower extremity edema. No clubbing or deformities. Neuro: Alert and oriented x 3.  Grossly intact. Skin: Warm and dry, no jaundice.   Psych: Alert and cooperative, normal mood and affect.   Imaging Studies: Nm Pet Image Restag (ps) Skull Base To Thigh  Result Date: 12/17/2017 CLINICAL DATA:  Subsequent treatment strategy for metastatic renal cell carcinoma. EXAM: NUCLEAR MEDICINE PET SKULL BASE  TO THIGH TECHNIQUE: 8.5 mCi F-18 FDG was injected intravenously. Full-ring PET imaging was performed from the skull base to thigh after the radiotracer. CT data was obtained and used for attenuation correction and anatomic localization. Fasting blood glucose: 113 mg/dl COMPARISON:  Multiple exams, including PET-CT from 10/15/2016 and CT scans from 09/20/2017 and 06/07/2017 FINDINGS: Mediastinal blood pool activity: SUV max 1.2 NECK: No significant abnormal hypermetabolic activity in this region. Incidental CT findings: Atherosclerotic calcification of the carotid bulbs. CHEST: 2.7 by 2.2 cm left lower lobe pulmonary nodule has a maximum SUV of 1.8 (formerly 2.4 on 10/15/2016) and previously measured 2.7 by 2.4 cm on 09/20/2017. Right eccentric subcarinal lymph node measures 2.5 cm in short axis on image 93/3 (formerly 2.4 cm by my measurements on 06/07/2017) and has a maximum SUV of 3.0 (formerly 2.7).  Indistinctly marginated confluent left hilar adenopathy has a maximum SUV of 2.0 (formerly 2.6). A right internal mammary lymph node measuring 1.0 cm in short axis on image 85/3 (formerly 0.5 cm on 06/07/2017) has a maximum SUV of 2.7 (formerly not hypermetabolic). The scattered bilateral pulmonary nodule 7 minimal faint activity and are similar to those shown on 06/07/2017. Similarly there is very low-grade activity associated with the right hilar adenopathy. Small left and trace right pleural effusions are significantly increased from 09/20/2017, but nonspecific for transudative versus exudative etiology. Incidental CT findings: Coronary, aortic arch, and branch vessel atherosclerotic vascular disease. Cachexia. Diffuse subcutaneous edema. ABDOMEN/PELVIS: Prominent ascites with marginal accentuated activity favoring malignant ascites. Low-grade activity in infiltrative mesenteric and omental tissues. While much of this may be from extensive third spacing of fluid, I can not exclude malignant ascites. A representative section of the left upper quadrant omentum has a maximum SUV of 2.6. Fluid and thickening extend through a left indirect inguinal hernia into the left groin and scrotum, with a maximum SUV of 2.7. There is also a separate notable left hydrocele. Incidental CT findings: Cholelithiasis. Aortoiliac atherosclerotic vascular disease. Prominent splenomegaly. Left nephrectomy. No dilated bowel. Diffuse subcutaneous edema. SKELETON: No significant abnormal hypermetabolic activity in this region. Incidental CT findings: IMPRESSION: 1. In the chest, some areas of malignancy including the dominant left lower lobe pulmonary nodule and the confluent left hilar adenopathy are mildly reduced in maximum SUV compared to the prior exam, and still have low-grade activity. However, other areas such as the right eccentric subcarinal lymph node and new right internal mammary adenopathy are increased in metabolic activity.  2. Progressive diffuse ascites and diffuse mesenteric and omental edema. There is a thick and hypermetabolic rind associated with the left inguinal hydrocele. There is also some low-grade activity along peritoneal margins and in the omentum. Given the generally low-grade nature of the tumor to start with, I can not exclude peritoneal spread of malignancy. Correlate with any pathology from the paracenteses. If not already performed it may be prudent to send paracentesis fluid for pathology analysis. 3. Cachexia. 4.  Aortic Atherosclerosis (ICD10-I70.0).  Coronary atherosclerosis. 5. Cholelithiasis. 6. Prominent splenomegaly. Electronically Signed   By: Van Clines M.D.   On: 12/17/2017 15:57   US Paracentesis  Result Date: 12/10/2017 INDICATION: History of multiple medical problems including stage IV renal cell carcinoma, post left-sided nephrectomy (May 2014), metastatic to the lung with decompensated cirrhosis and recurrent ascites and chronic kidney disease (stage III) who presents today for repeat ultrasound-guided paracentesis for therapeutic purposes. EXAM: ULTRASOUND-GUIDED PARACENTESIS COMPARISON:  Previous ultrasound-guided paracenteses most recently on 11/15/2017 yielding 6 L of peritoneal fluid.; CT abdomen  and pelvis - 09/20/2017 MEDICATIONS: None. COMPLICATIONS: None immediate. TECHNIQUE: Informed written consent was obtained from the patient after a discussion of the risks, benefits and alternatives to treatment. A timeout was performed prior to the initiation of the procedure. Initial ultrasound scanning demonstrates a moderate to large amount of ascites within the right lower abdominal quadrant. The right lower abdomen was prepped and draped in the usual sterile fashion. 1% lidocaine with epinephrine was used for local anesthesia. An ultrasound image was saved for documentation purposed. An 8 Fr Safe-T-Centesis catheter was introduced. The paracentesis was performed. The catheter was  removed and a dressing was applied. The patient tolerated the procedure well without immediate post procedural complication. FINDINGS: A total of approximately 5.4 liters of serous fluid was removed. IMPRESSION: Successful ultrasound-guided paracentesis yielding 5.4 liters of peritoneal fluid. Electronically Signed   By: Sandi Mariscal M.D.   On: 12/10/2017 15:27   Dg Abdomen Acute W/chest  Result Date: 12/12/2017 CLINICAL DATA:  Lower abdominal pain and nausea. No bowel movement in 4 days. EXAM: DG ABDOMEN ACUTE W/ 1V CHEST COMPARISON:  Chest x-ray 10/18/2017 and CT abdomen/pelvis 09/20/2017 FINDINGS: The upright chest x-ray demonstrates normal cardiomediastinal silhouette. The pulmonary hila appear normal in stable. No infiltrates or edema. There is a small left pleural effusion which appears stable. Two views of the abdomen demonstrate scattered stool in the colon and down into the rectum. There are few air-filled loops of small bowel but no distension or air-fluid levels to suggest obstruction. No free air. Overall increased density of the abdomen, poor definition of the soft tissue structures and centralization of the bowel suggests ascites. IMPRESSION: 1. No acute cardiopulmonary findings. There is a small persistent left pleural effusion. 2. Suspect abdominal ascites. 3. Scattered stool in the colon and down into the rectum. No findings for small bowel obstruction or free air. Electronically Signed   By: Marijo Sanes M.D.   On: 12/12/2017 08:43    Assessment and Plan:   Rodney Stewart is a 62 y.o. y/o malehere to follow upfor chronic liver disease/cirhosis, asciteslikely secondary to alcohol. No encephelopathy. Elevated alkaline phosphotase likely from metastatic RCC. Sensitive creatinine and hard to manage with diuretics as raising the doses of diuretics needs to worsening of his kidney function and hypotension .  He is not a candidate for  liver transplant due to metastatic malignancy. He was  seen by Radiology at [facility] and was deemed too high risk for TIPS due to elevated MELD score. His ascites is being controlled by PRN paracentesis.    His liver care at this time is directed towards comfort as the disease process is not reversible.   Plan  1..Low salt diet, avoid tylenol 2.Follows with palliative care , suggest to continue to do so .  3.  Follow up with Dr Grayland Ormond for his metastatic cancer 4. No further change in management in terms of his liver disease. No further "optimization " can be done at this time. Continue PRN paracentesis. IF he is constipated commence on lactulose. No indication to check routine ammonia levels.   Dr Jonathon Bellows  MD,MRCP [facility]) Follow up 5 months

## 2017-12-26 ENCOUNTER — Encounter: Payer: Self-pay | Admitting: Internal Medicine

## 2017-12-26 ENCOUNTER — Ambulatory Visit (INDEPENDENT_AMBULATORY_CARE_PROVIDER_SITE_OTHER): Payer: Medicare HMO | Admitting: Internal Medicine

## 2017-12-26 ENCOUNTER — Telehealth: Payer: Self-pay | Admitting: Internal Medicine

## 2017-12-26 VITALS — BP 132/70 | HR 105 | Temp 98.4°F | Ht 71.0 in | Wt 177.8 lb

## 2017-12-26 DIAGNOSIS — Z794 Long term (current) use of insulin: Secondary | ICD-10-CM

## 2017-12-26 DIAGNOSIS — R600 Localized edema: Secondary | ICD-10-CM

## 2017-12-26 DIAGNOSIS — R269 Unspecified abnormalities of gait and mobility: Secondary | ICD-10-CM

## 2017-12-26 DIAGNOSIS — D61818 Other pancytopenia: Secondary | ICD-10-CM

## 2017-12-26 DIAGNOSIS — K7460 Unspecified cirrhosis of liver: Secondary | ICD-10-CM | POA: Diagnosis not present

## 2017-12-26 DIAGNOSIS — E113399 Type 2 diabetes mellitus with moderate nonproliferative diabetic retinopathy without macular edema, unspecified eye: Secondary | ICD-10-CM | POA: Diagnosis not present

## 2017-12-26 DIAGNOSIS — N433 Hydrocele, unspecified: Secondary | ICD-10-CM

## 2017-12-26 DIAGNOSIS — C649 Malignant neoplasm of unspecified kidney, except renal pelvis: Secondary | ICD-10-CM | POA: Diagnosis not present

## 2017-12-26 DIAGNOSIS — K5900 Constipation, unspecified: Secondary | ICD-10-CM | POA: Diagnosis not present

## 2017-12-26 DIAGNOSIS — E1142 Type 2 diabetes mellitus with diabetic polyneuropathy: Secondary | ICD-10-CM | POA: Diagnosis not present

## 2017-12-26 DIAGNOSIS — E039 Hypothyroidism, unspecified: Secondary | ICD-10-CM

## 2017-12-26 MED ORDER — LACTULOSE 10 GM/15ML PO SOLN: 30.0000 g | Freq: Every day | ORAL | 2 refills | Status: AC

## 2017-12-26 MED ORDER — LEVOTHYROXINE SODIUM 75 MCG PO TABS: 75.0000 ug | ORAL_TABLET | Freq: Every day | ORAL | 0 refills | Status: AC

## 2017-12-26 MED ORDER — DOCUSATE SODIUM 100 MG PO CAPS: 100.0000 mg | ORAL_CAPSULE | Freq: Two times a day (BID) | ORAL | 2 refills | Status: AC | PRN

## 2017-12-26 NOTE — Patient Instructions (Addendum)
01/15/2018 Office Visit Gastroenterology Debarah Crape, MD  [address]  [id number]  [place], [place] [address]  [phone]  [phone] (Fax)     [facility] appt -ask about would they rec TIPS and other recommendations for leg swelling. Tried Spironolactone but caused elevated potassium and kidney #s   Take lactulose as prescribed  [facility] of [facility]    Ref. Range 12/19/2017 13:15  COMPREHENSIVE METABOLIC PANEL Unknown Rpt (A)  Sodium Latest Ref Range: 135 - 145 mmol/L 133 (L)  Potassium Latest Ref Range: 3.5 - 5.1 mmol/L 3.3 (L)  Chloride Latest Ref Range: 98 - 111 mmol/L 97 (L)  CO2 Latest Ref Range: 22 - 32 mmol/L 26  Glucose Latest Ref Range: 70 - 99 mg/dL 140 (H)  BUN Latest Ref Range: 8 - 23 mg/dL 37 (H)  Creatinine Latest Ref Range: 0.61 - 1.24 mg/dL 1.59 (H)  Calcium Latest Ref Range: 8.9 - 10.3 mg/dL 7.7 (L)  Anion gap Latest Ref Range: 5 - 15  10  Alkaline Phosphatase Latest Ref Range: 38 - 126 U/L 323 (H)  Albumin Latest Ref Range: 3.5 - 5.0 g/dL 2.3 (L)  AST Latest Ref Range: 15 - 41 U/L 37  ALT Latest Ref Range: 0 - 44 U/L 20  Total Protein Latest Ref Range: 6.5 - 8.1 g/dL 7.3  Total Bilirubin Latest Ref Range: 0.3 - 1.2 mg/dL 1.8 (H)  GFR, Est Non African American Latest Ref Range: >60 mL/min 46 (L)  GFR, Est African American Latest Ref Range: >60 mL/min 54 (L)  WBC Latest Ref Range: 4.0 - 10.5 K/uL 3.0 (L)  RBC Latest Ref Range: 4.22 - 5.81 MIL/uL 3.03 (L)  Hemoglobin Latest Ref Range: 13.0 - 17.0 g/dL 7.5 (L)  HCT Latest Ref Range: 39.0 - 52.0 % 24.4 (L)  MCV Latest Ref Range: 80.0 - 100.0 fL 80.5  MCH Latest Ref Range: 26.0 - 34.0 pg 24.8 (L)  MCHC Latest Ref Range: 30.0 - 36.0 g/dL 30.7  RDW Latest Ref Range: 11.5 - 15.5 % 13.9  Platelets Latest Ref Range: 150 - 400 K/uL 136 (L)  nRBC Latest Ref Range: 0.0 - 0.2 % 0.0  Neutrophils Latest Units: % 72  Lymphocytes Latest Units: % 14  Monocytes  Relative Latest Units: % 9  Eosinophil Latest Units: % 5  Basophil Latest Units: % 0  Immature Granulocytes Latest Units: % 0  NEUT# Latest Ref Range: 1.7 - 7.7 K/uL 2.1  Lymphocyte # Latest Ref Range: 0.7 - 4.0 K/uL 0.4 (L)  Monocyte # Latest Ref Range: 0.1 - 1.0 K/uL 0.3  Eosinophils Absolute Latest Ref Range: 0.0 - 0.5 K/uL 0.2  Basophils Absolute Latest Ref Range: 0.0 - 0.1 K/uL 0.0  Abs Immature Granulocytes Latest Ref Range: 0.00 - 0.07 K/uL 0.01  TSH Latest Ref Range: 0.450 - 4.500 uIU/mL 9.470 (H)  Thyroxine (T4) Latest Ref Range: 4.5 - 12.0 ug/dL 5.7  Free Thyroxine Index Latest Ref Range: 1.2 - 4.9  1.7  T3 Uptake Ratio Latest Ref Range: 24 - 39 % 29

## 2017-12-26 NOTE — Telephone Encounter (Signed)
Can you have someone from your office call to discuss this with the patient  Lower lactulose or other medication alternative   Thanks [place]

## 2017-12-26 NOTE — Progress Notes (Signed)
Pre visit review using our clinic review tool, if applicable. No additional management support is needed unless otherwise documented below in the visit note. 

## 2017-12-26 NOTE — Telephone Encounter (Signed)
-----   Message from Jonathon Bellows, MD sent at 12/26/2017  2:05 PM EST ----- 1. very low dose say 5-10 ml once at night or just try xifaxan 550 mg BID   Kiran  ----- Message ----- From: [name], [name], MD Sent: 12/26/2017   1:40 PM EST To: Jonathon Bellows, MD  FYI pt not taking lactulose is there alternative he states causes abdominal cramps?

## 2017-12-26 NOTE — Progress Notes (Signed)
Note has been faxed to [facility]

## 2017-12-26 NOTE — Progress Notes (Addendum)
Chief Complaint  Patient presents with  . Follow-up   F/u  1. C/o leg edema on lasix 20 mg 1-2 pills qd. When tried spironolactone for cirrhosis caused AKI and elevated Cr so stopped by GI Dr. Jonathon Bellows. GI appt [facility] 01/15/18 will see if they have other recs. Or if they will consider TIPS 2. Constipation taking miralax prn, he is not taking lactulose for #1 due to causing leg cramps. No blood in stool  3. Pancytopenia likely 2/2 malignancy history and medication induced he is taking iron daily. Reviewed H/o note 12/19/17 no plan for transfusion just monitor for now hbg was 7.5 denies blood in stool and denies fatigue.  4. DM 2 A1C 8.9 09/23/17 on insulin h/o hypoglycemia likely due to cirrhosis  5. He tripped ~1 week ago over area rug at his home PT at home stopped and he does not think he needs this right now and declines he through the rug away 6. Hypothyroidism uncontrolled on levo 50 mcg qd   Review of Systems  Constitutional: Positive for malaise/fatigue. Negative for weight loss.  HENT: Negative for hearing loss.   Eyes: Negative for blurred vision.  Respiratory: Negative for shortness of breath.   Cardiovascular: Positive for leg swelling.  Gastrointestinal: Negative for abdominal pain.       +ab distension  Genitourinary:       +scrotal swelling   Musculoskeletal: Positive for falls.  Skin: Negative for rash.  Neurological: Negative for headaches.  Psychiatric/Behavioral: Negative for memory loss.   Past Medical History:  Diagnosis Date  . Anemia   . Ascites   . BPH (benign prostatic hyperplasia)   . Cirrhosis ([place])   . CKD (chronic kidney disease) stage 3, GFR 30-59 ml/min (HCC)   . Clear cell renal cell carcinoma ([place]) 2014   Left Nephrectomy.  . Colon polyps   . Diabetes mellitus without complication ([place])    type 2   . Dyspnea    with exertion  . GERD (gastroesophageal reflux disease)   . History of gout   . History of nephrectomy    Left  . Hypertension   .  Neuropathy   . Renal Cancer    Renal Cancer  . Renal cell carcinoma of left kidney (HCC)    mets to lungs  . Umbilical hernia    Past Surgical History:  Procedure Laterality Date  . BACK SURGERY     ruptured disc 1991   . ENDOBRONCHIAL ULTRASOUND N/A 10/14/2015   Procedure: ENDOBRONCHIAL ULTRASOUND;  Surgeon: [name], MD;  Location: [facility] ORS;  Service: Pulmonary;  Laterality: N/A;  . ESOPHAGOGASTRODUODENOSCOPY (EGD) WITH PROPOFOL N/A 04/15/2017   Procedure: ESOPHAGOGASTRODUODENOSCOPY (EGD) WITH PROPOFOL;  Surgeon: Jonathon Bellows, MD;  Location: [facility] ENDOSCOPY;  Service: Gastroenterology;  Laterality: N/A;  Screen for esophageal varices  . EYE SURGERY Bilateral    Cataract Extraction with IOL  . IR RADIOLOGIST EVAL & MGMT  10/15/2017  . KNEE SURGERY Right   . NEPHRECTOMY     left kidney 2014 renal cell cancer    Family History  Problem Relation Age of Onset  . Ovarian cancer Mother   . Diabetes Father   . Hypertension Father    Social History   Socioeconomic History  . Marital status: Divorced    Spouse name: Not on file  . Number of children: 2  . Years of education: Not on file  . Highest education level: Not on file  Occupational History  . Not on file  Social Needs  . Financial resource strain: Not hard at all  . Food insecurity:    Worry: Never true    Inability: Never true  . Transportation needs:    Medical: No    Non-medical: No  Tobacco Use  . Smoking status: Former Smoker    Packs/day: 1.00    Types: Cigarettes    Last attempt to quit: 04/15/1993    Years since quitting: 24.7  . Smokeless tobacco: Never Used  Substance and Sexual Activity  . Alcohol use: Not Currently  . Drug use: No  . Sexual activity: Yes  Lifestyle  . Physical activity:    Days per week: Patient refused    Minutes per session: Patient refused  . Stress: Patient refused  Relationships  . Social connections:    Talks on phone: Patient refused    Gets together: Patient  refused    Attends religious service: Patient refused    Active member of club or organization: Patient refused    Attends meetings of clubs or organizations: Patient refused    Relationship status: Patient refused  . Intimate partner violence:    Fear of current or ex partner: Patient refused    Emotionally abused: Patient refused    Physically abused: Patient refused    Forced sexual activity: Patient refused  Other Topics Concern  . Not on file  Social History Narrative   HS graduate    Retired    Divorced    2 daughters    Lives alone    [name] to travel to [place]    1 living brother who is doctor cardiologist in [place] [phone] emergency contact    Has guns, wears seat belt    No outpatient medications have been marked as taking for the 12/26/17 encounter (Office Visit) with [name], [name], MD.   No Known Allergies Recent Results (from the past 2160 hour(s))  CBC with Differential/Platelet     Status: Abnormal   Collection Time: 10/09/17  9:22 AM  Result Value Ref Range   WBC 5.2 4.0 - 10.5 K/uL   RBC 3.83 (L) 4.22 - 5.81 MIL/uL   Hemoglobin 10.6 (L) 13.0 - 17.0 g/dL   HCT 32.0 (L) 39.0 - 52.0 %   MCV 83.6 80.0 - 100.0 fL   MCH 27.7 26.0 - 34.0 pg   MCHC 33.1 30.0 - 36.0 g/dL   RDW 16.3 (H) 11.5 - 15.5 %   Platelets 134 (L) 150 - 400 K/uL   nRBC 0.0 0.0 - 0.2 %   Neutrophils Relative % 77 %   Neutro Abs 3.9 1.7 - 7.7 K/uL   Lymphocytes Relative 10 %   Lymphs Abs 0.5 (L) 0.7 - 4.0 K/uL   Monocytes Relative 11 %   Monocytes Absolute 0.6 0.1 - 1.0 K/uL   Eosinophils Relative 2 %   Eosinophils Absolute 0.1 0.0 - 0.5 K/uL   Basophils Relative 0 %   Basophils Absolute 0.0 0.0 - 0.1 K/uL   Immature Granulocytes 0 %   Abs Immature Granulocytes 0.01 0.00 - 0.07 K/uL    Comment: Performed at [facility], [address] [place], [place] [address]  Comprehensive metabolic panel     Status: Abnormal   Collection Time: 10/09/17  9:22 AM   Result Value Ref Range   Sodium 120 (L) 135 - 145 mmol/L   Potassium 5.3 (H) 3.5 - 5.1 mmol/L   Chloride 85 (L) 98 - 111 mmol/L   CO2 28  22 - 32 mmol/L   Glucose, Bld 86 70 - 99 mg/dL   BUN 31 (H) 8 - 23 mg/dL   Creatinine, Ser 1.46 (H) 0.61 - 1.24 mg/dL   Calcium 8.0 (L) 8.9 - 10.3 mg/dL   Total Protein 7.0 6.5 - 8.1 g/dL   Albumin 2.2 (L) 3.5 - 5.0 g/dL   AST 46 (H) 15 - 41 U/L   ALT 18 0 - 44 U/L   Alkaline Phosphatase 343 (H) 38 - 126 U/L   Total Bilirubin 2.1 (H) 0.3 - 1.2 mg/dL   GFR calc non Af Amer 50 (L) >60 mL/min   GFR calc Af Amer 58 (L) >60 mL/min    Comment: (NOTE) The eGFR has been calculated using the CKD EPI equation. This calculation has not been validated in all clinical situations. eGFR's persistently <60 mL/min signify possible Chronic Kidney Disease.    Anion gap 7 5 - 15    Comment: Performed at [facility], [address] [place], [place] [address]  Thyroid Panel With TSH     Status: Abnormal   Collection Time: 10/09/17  9:22 AM  Result Value Ref Range   TSH 19.570 (H) 0.450 - 4.500 uIU/mL   T4, Total 6.1 4.5 - 12.0 ug/dL   T3 Uptake Ratio 34 24 - 39 %   Free Thyroxine Index 2.1 1.2 - 4.9    Comment: (NOTE) Performed At: [facility] [place], [place] [id number] Rush Farmer MD NO:[id number]   Lipase, blood     Status: None   Collection Time: 10/18/17 10:03 AM  Result Value Ref Range   Lipase 31 11 - 51 U/L    Comment: Performed at [facility], [address] [place], [place] [address]  Comprehensive metabolic panel     Status: Abnormal   Collection Time: 10/18/17 10:03 AM  Result Value Ref Range   Sodium 121 (L) 135 - 145 mmol/L   Potassium 5.6 (H) 3.5 - 5.1 mmol/L   Chloride 86 (L) 98 - 111 mmol/L   CO2 24 22 - 32 mmol/L   Glucose, Bld 295 (H) 70 - 99 mg/dL   BUN 19 8 - 23 mg/dL   Creatinine, Ser 1.43 (H) 0.61 - 1.24 mg/dL   Calcium 7.7 (L) 8.9 - 10.3 mg/dL   Total Protein 6.7 6.5 - 8.1 g/dL    Albumin 2.0 (L) 3.5 - 5.0 g/dL   AST 31 15 - 41 U/L   ALT 14 0 - 44 U/L   Alkaline Phosphatase 289 (H) 38 - 126 U/L   Total Bilirubin 3.7 (H) 0.3 - 1.2 mg/dL   GFR calc non Af Amer 51 (L) >60 mL/min   GFR calc Af Amer 60 (L) >60 mL/min    Comment: (NOTE) The eGFR has been calculated using the CKD EPI equation. This calculation has not been validated in all clinical situations. eGFR's persistently <60 mL/min signify possible Chronic Kidney Disease.    Anion gap 11 5 - 15    Comment: Performed at [facility], [address] [place], [place] [address]  CBC     Status: Abnormal   Collection Time: 10/18/17 10:03 AM  Result Value Ref Range   WBC 6.1 4.0 - 10.5 K/uL   RBC 4.00 (L) 4.22 - 5.81 MIL/uL   Hemoglobin 11.5 (L) 13.0 - 17.0 g/dL   HCT 33.9 (L) 39.0 - 52.0 %   MCV 84.8 80.0 - 100.0 fL   MCH 28.8 26.0 -  34.0 pg   MCHC 33.9 30.0 - 36.0 g/dL   RDW 18.2 (H) 11.5 - 15.5 %   Platelets 145 (L) 150 - 400 K/uL   nRBC 0.0 0.0 - 0.2 %    Comment: Performed at [facility], [address] [place], [place] [address]  Urinalysis, Complete w Microscopic     Status: Abnormal   Collection Time: 10/18/17 10:03 AM  Result Value Ref Range   Color, Urine AMBER (A) YELLOW    Comment: BIOCHEMICALS MAY BE AFFECTED BY COLOR   APPearance CLEAR (A) CLEAR   Specific Gravity, Urine 1.012 1.005 - 1.030   pH 5.0 5.0 - 8.0   Glucose, UA 150 (A) NEGATIVE mg/dL   Hgb urine dipstick NEGATIVE NEGATIVE   Bilirubin Urine NEGATIVE NEGATIVE   Ketones, ur 5 (A) NEGATIVE mg/dL   Protein, ur NEGATIVE NEGATIVE mg/dL   Nitrite NEGATIVE NEGATIVE   Leukocytes, UA NEGATIVE NEGATIVE   RBC / HPF 0-5 0 - 5 RBC/hpf   WBC, UA 0-5 0 - 5 WBC/hpf   Bacteria, UA NONE SEEN NONE SEEN   Squamous Epithelial / LPF 0-5 0 - 5   Mucus PRESENT    Hyaline Casts, UA PRESENT     Comment: Performed at [facility], [address] [place], [place] [address]  Ammonia     Status: None   Collection  Time: 10/18/17 10:03 AM  Result Value Ref Range   Ammonia 17 9 - 35 umol/L    Comment: Performed at [facility], [address] [place], [place] [address]  Protime-INR     Status: Abnormal   Collection Time: 10/18/17 10:03 AM  Result Value Ref Range   Prothrombin Time 17.5 (H) 11.4 - 15.2 seconds   INR 1.45     Comment: Performed at [facility], [address] [place], [place] [address]  Osmolality     Status: Abnormal   Collection Time: 10/18/17  4:15 PM  Result Value Ref Range   Osmolality 268 (L) 275 - 295 mOsm/kg    Comment: Performed at [facility], [place] [address] [place], [place] [address]  Glucose, capillary     Status: Abnormal   Collection Time: 10/19/17 12:21 AM  Result Value Ref Range   Glucose-Capillary 326 (H) 70 - 99 mg/dL  Basic metabolic panel     Status: Abnormal   Collection Time: 10/19/17  3:59 AM  Result Value Ref Range   Sodium 122 (L) 135 - 145 mmol/L   Potassium 5.3 (H) 3.5 - 5.1 mmol/L   Chloride 88 (L) 98 - 111 mmol/L   CO2 24 22 - 32 mmol/L   Glucose, Bld 303 (H) 70 - 99 mg/dL   BUN 22 8 - 23 mg/dL   Creatinine, Ser 1.63 (H) 0.61 - 1.24 mg/dL   Calcium 7.5 (L) 8.9 - 10.3 mg/dL   GFR calc non Af Amer 44 (L) >60 mL/min   GFR calc Af Amer 51 (L) >60 mL/min    Comment: (NOTE) The eGFR has been calculated using the CKD EPI equation. This calculation has not been validated in all clinical situations. eGFR's persistently <60 mL/min signify possible Chronic Kidney Disease.    Anion gap 10 5 - 15    Comment: Performed at [facility], [address] [place], [place] [address]  CBC     Status: Abnormal   Collection Time: 10/19/17  3:59 AM  Result Value Ref Range   WBC 6.7 4.0 -  10.5 K/uL   RBC 3.67 (L) 4.22 - 5.81 MIL/uL   Hemoglobin 10.4 (L) 13.0 - 17.0 g/dL   HCT 31.4 (L) 39.0 - 52.0 %   MCV 85.6 80.0 - 100.0 fL   MCH 28.3 26.0 - 34.0 pg   MCHC 33.1 30.0 - 36.0 g/dL   RDW 18.4 (H) 11.5 - 15.5 %   Platelets  137 (L) 150 - 400 K/uL   nRBC 0.0 0.0 - 0.2 %    Comment: Performed at [facility], [address] [place], [place] [address]  Glucose, capillary     Status: Abnormal   Collection Time: 10/19/17  7:40 AM  Result Value Ref Range   Glucose-Capillary 303 (H) 70 - 99 mg/dL  Sodium     Status: Abnormal   Collection Time: 10/19/17 10:41 AM  Result Value Ref Range   Sodium 122 (L) 135 - 145 mmol/L    Comment: Performed at [facility], [address] [place], [place] [address]  Glucose, capillary     Status: Abnormal   Collection Time: 10/19/17 11:34 AM  Result Value Ref Range   Glucose-Capillary 286 (H) 70 - 99 mg/dL  Sodium     Status: Abnormal   Collection Time: 10/19/17  4:20 PM  Result Value Ref Range   Sodium 120 (L) 135 - 145 mmol/L    Comment: Performed at [facility], [address] [place], [place] [address]  Glucose, capillary     Status: Abnormal   Collection Time: 10/19/17  4:57 PM  Result Value Ref Range   Glucose-Capillary 218 (H) 70 - 99 mg/dL  Glucose, capillary     Status: Abnormal   Collection Time: 10/19/17  9:44 PM  Result Value Ref Range   Glucose-Capillary 285 (H) 70 - 99 mg/dL  Sodium     Status: Abnormal   Collection Time: 10/19/17  9:59 PM  Result Value Ref Range   Sodium 123 (L) 135 - 145 mmol/L    Comment: Performed at [facility], [address] [place],  [address]  Comprehensive metabolic panel     Status: Abnormal   Collection Time: 10/20/17  3:00 AM  Result Value Ref Range   Sodium 125 (L) 135 - 145 mmol/L   Potassium 4.0 3.5 - 5.1 mmol/L   Chloride 89 (L) 98 - 111 mmol/L   CO2 27 22 - 32 mmol/L   Glucose, Bld 296 (H) 70 - 99 mg/dL   BUN 21 8 - 23 mg/dL   Creatinine, Ser 1.26 (H) 0.61 - 1.24 mg/dL   Calcium 7.7 (L) 8.9 - 10.3 mg/dL   Total Protein 5.6 (L) 6.5 - 8.1 g/dL   Albumin 2.4 (L) 3.5 - 5.0 g/dL   AST 26 15 - 41 U/L   ALT 13 0 - 44 U/L   Alkaline Phosphatase 232 (H) 38 - 126 U/L    Total Bilirubin 2.4 (H) 0.3 - 1.2 mg/dL   GFR calc non Af Amer 60 (L) >60 mL/min   GFR calc Af Amer >60 >60 mL/min    Comment: (NOTE) The eGFR has been calculated using the CKD EPI equation. This calculation has not been validated in all clinical situations. eGFR's persistently <60 mL/min signify possible Chronic Kidney Disease.    Anion gap 9 5 - 15    Comment: Performed at [facility], [address] [place],  [address]  Glucose, capillary     Status: Abnormal   Collection Time: 10/20/17  7:42 AM  Result Value Ref Range   Glucose-Capillary 322 (H) 70 - 99 mg/dL  Sodium     Status: Abnormal   Collection Time: 10/20/17  9:44 AM  Result Value Ref Range   Sodium 121 (L) 135 - 145 mmol/L    Comment: Performed at [facility], [address] [place], [place] [address]  Glucose, capillary     Status: Abnormal   Collection Time: 10/20/17 12:03 PM  Result Value Ref Range   Glucose-Capillary 347 (H) 70 - 99 mg/dL  Sodium     Status: Abnormal   Collection Time: 10/20/17  5:25 PM  Result Value Ref Range   Sodium 125 (L) 135 - 145 mmol/L    Comment: Performed at [facility], [address] [place], [place] [address]  Glucose, capillary     Status: Abnormal   Collection Time: 10/20/17  5:43 PM  Result Value Ref Range   Glucose-Capillary 331 (H) 70 - 99 mg/dL  Glucose, capillary     Status: Abnormal   Collection Time: 10/20/17  9:15 PM  Result Value Ref Range   Glucose-Capillary 352 (H) 70 - 99 mg/dL  Sodium     Status: Abnormal   Collection Time: 10/21/17 12:34 AM  Result Value Ref Range   Sodium 126 (L) 135 - 145 mmol/L    Comment: Performed at [facility], [address], [place], [place] [address]  Sodium     Status: Abnormal   Collection Time: 10/21/17  4:27 AM  Result Value Ref Range   Sodium 127 (L) 135 - 145 mmol/L    Comment: Performed at [facility], [address] [place], [place] [address]  Glucose,  capillary     Status: Abnormal   Collection Time: 10/21/17  7:44 AM  Result Value Ref Range   Glucose-Capillary 369 (H) 70 - 99 mg/dL  Sodium     Status: Abnormal   Collection Time: 10/21/17  9:54 AM  Result Value Ref Range   Sodium 128 (L) 135 - 145 mmol/L    Comment: Performed at [facility], [address] [place], [place] [address]  Glucose, capillary     Status: Abnormal   Collection Time: 10/21/17 11:39 AM  Result Value Ref Range   Glucose-Capillary 277 (H) 70 - 99 mg/dL  Sodium     Status: Abnormal   Collection Time: 10/21/17  4:15 PM  Result Value Ref Range   Sodium 126 (L) 135 - 145 mmol/L    Comment: Performed at [facility], [address] [place], [place] [address]  Glucose, capillary     Status: Abnormal   Collection Time: 10/21/17  4:56 PM  Result Value Ref Range   Glucose-Capillary 316 (H) 70 - 99 mg/dL  Glucose, capillary     Status: Abnormal   Collection Time: 10/21/17  8:28 PM  Result Value Ref Range   Glucose-Capillary 221 (H) 70 - 99 mg/dL  Sodium     Status: Abnormal   Collection Time: 10/21/17  9:48 PM  Result Value Ref Range   Sodium 129 (L) 135 - 145 mmol/L    Comment: Performed at [facility], [address] [place], [place] [address]  BUN     Status: None   Collection Time: 10/22/17  4:19 AM  Result Value Ref Range   BUN 15 8 - 23 mg/dL    Comment: Performed at [facility], [address] [place], [place] [address]  Creatinine,  serum     Status: None   Collection Time: 10/22/17  4:19 AM  Result Value Ref Range   Creatinine, Ser 1.08 0.61 - 1.24 mg/dL   GFR calc non Af Amer >60 >60 mL/min   GFR calc Af Amer >60 >60 mL/min    Comment: (NOTE) The eGFR has been calculated using the CKD EPI equation. This calculation has not been validated in all clinical situations. eGFR's persistently <60 mL/min signify possible Chronic Kidney Disease. Performed at [facility], [address],  [place], [place] [address]   Sodium     Status: Abnormal   Collection Time: 10/22/17  4:24 AM  Result Value Ref Range   Sodium 130 (L) 135 - 145 mmol/L    Comment: Performed at [facility], [address], [place], [place] [address]  Potassium     Status: Abnormal   Collection Time: 10/22/17  4:24 AM  Result Value Ref Range   Potassium 2.9 (L) 3.5 - 5.1 mmol/L    Comment: Performed at [facility], [address], [place], [place] [address]  Phosphorus     Status: None   Collection Time: 10/22/17  4:24 AM  Result Value Ref Range   Phosphorus 2.8 2.5 - 4.6 mg/dL    Comment: Performed at [facility], [address] [place], [place] [address]  Magnesium     Status: Abnormal   Collection Time: 10/22/17  4:24 AM  Result Value Ref Range   Magnesium 1.1 (L) 1.7 - 2.4 mg/dL    Comment: Performed at [facility], [address] [place], [place] [address]  Glucose, capillary     Status: Abnormal   Collection Time: 10/22/17  7:26 AM  Result Value Ref Range   Glucose-Capillary 138 (H) 70 - 99 mg/dL  Sodium     Status: Abnormal   Collection Time: 10/22/17  9:59 AM  Result Value Ref Range   Sodium 129 (L) 135 - 145 mmol/L    Comment: Performed at [facility], [address] [place], [place] [address]  Glucose, capillary     Status: Abnormal   Collection Time: 10/22/17 11:40 AM  Result Value Ref Range   Glucose-Capillary 190 (H) 70 - 99 mg/dL  Ammonia     Status: Abnormal   Collection Time: 10/22/17 12:53 PM  Result Value Ref Range   Ammonia 40 (H) 9 - 35 umol/L    Comment: Performed at [facility], [address] [place], [place] [address]  Sodium     Status: Abnormal   Collection Time: 10/22/17  4:18 PM  Result Value Ref Range   Sodium 129 (L) 135 - 145 mmol/L    Comment: Performed at [facility], [address] [place],  [address]  Thyroid Panel With TSH     Status: Abnormal   Collection Time: 10/23/17   1:21 PM  Result Value Ref Range   TSH 11.450 (H) 0.450 - 4.500 uIU/mL   T4, Total 4.7 4.5 - 12.0 ug/dL   T3 Uptake Ratio 30 24 - 39 %   Free Thyroxine Index 1.4 1.2 - 4.9    Comment: (NOTE) Performed At: [facility] [place], [place] [id number] Rush Farmer MD [id number]   Comprehensive metabolic panel     Status: Abnormal   Collection Time: 10/23/17  1:21 PM  Result Value Ref Range   Sodium 128 (L) 135 - 145 mmol/L   Potassium 4.5 3.5 - 5.1 mmol/L  Chloride 89 (L) 98 - 111 mmol/L   CO2 30 22 - 32 mmol/L   Glucose, Bld 234 (H) 70 - 99 mg/dL   BUN 16 8 - 23 mg/dL   Creatinine, Ser 1.06 0.61 - 1.24 mg/dL   Calcium 8.1 (L) 8.9 - 10.3 mg/dL   Total Protein 6.8 6.5 - 8.1 g/dL   Albumin 3.3 (L) 3.5 - 5.0 g/dL   AST 64 (H) 15 - 41 U/L   ALT 25 0 - 44 U/L   Alkaline Phosphatase 264 (H) 38 - 126 U/L   Total Bilirubin 3.7 (H) 0.3 - 1.2 mg/dL   GFR calc non Af Amer >60 >60 mL/min   GFR calc Af Amer >60 >60 mL/min    Comment: (NOTE) The eGFR has been calculated using the CKD EPI equation. This calculation has not been validated in all clinical situations. eGFR's persistently <60 mL/min signify possible Chronic Kidney Disease.    Anion gap 9 5 - 15    Comment: Performed at [facility], [address] [place], [place] [address]  CBC with Differential/Platelet     Status: Abnormal   Collection Time: 10/23/17  1:25 PM  Result Value Ref Range   WBC 3.9 (L) 4.0 - 10.5 K/uL   RBC 3.27 (L) 4.22 - 5.81 MIL/uL   Hemoglobin 9.2 (L) 13.0 - 17.0 g/dL   HCT 28.1 (L) 39.0 - 52.0 %   MCV 85.9 80.0 - 100.0 fL   MCH 28.1 26.0 - 34.0 pg   MCHC 32.7 30.0 - 36.0 g/dL   RDW 18.0 (H) 11.5 - 15.5 %   Platelets 95 (L) 150 - 400 K/uL   nRBC 0.0 0.0 - 0.2 %   Neutrophils Relative % 79 %   Neutro Abs 3.1 1.7 - 7.7 K/uL   Lymphocytes Relative 10 %   Lymphs Abs 0.4 (L) 0.7 - 4.0 K/uL   Monocytes Relative 9 %   Monocytes Absolute 0.4 0.1 - 1.0 K/uL   Eosinophils  Relative 1 %   Eosinophils Absolute 0.0 0.0 - 0.5 K/uL   Basophils Relative 1 %   Basophils Absolute 0.0 0.0 - 0.1 K/uL   Immature Granulocytes 0 %   Abs Immature Granulocytes 0.01 0.00 - 0.07 K/uL    Comment: Performed at [facility], [address] [place], [place] [address]  Ammonia     Status: Abnormal   Collection Time: 11/05/17  2:58 PM  Result Value Ref Range   Ammonia 46 (H) 11 - 35 umol/L  Comprehensive metabolic panel     Status: Abnormal   Collection Time: 11/05/17  2:58 PM  Result Value Ref Range   Sodium 125 (L) 135 - 145 mEq/L   Potassium 4.1 3.5 - 5.1 mEq/L   Chloride 88 (L) 96 - 112 mEq/L   CO2 27 19 - 32 mEq/L   Glucose, Bld 399 (H) 70 - 99 mg/dL   BUN 51 (H) 6 - 23 mg/dL   Creatinine, Ser 1.97 (H) 0.40 - 1.50 mg/dL   Total Bilirubin 2.8 (H) 0.2 - 1.2 mg/dL   Alkaline Phosphatase 267 (H) 39 - 117 U/L   AST 29 0 - 37 U/L   ALT 14 0 - 53 U/L   Total Protein 7.3 6.0 - 8.3 g/dL   Albumin 2.9 (L) 3.5 - 5.2 g/dL   Calcium 8.4 8.4 - 10.5 mg/dL   GFR 36.88 (L) >60.00 mL/min  TSH     Status: Abnormal   Collection Time:  11/05/17  2:58 PM  Result Value Ref Range   TSH 7.02 (H) 0.35 - 4.50 uIU/mL  Beta-hydroxybutyric acid     Status: Abnormal   Collection Time: 11/05/17  3:05 PM  Result Value Ref Range   Beta-Hydroxybutyric Acid 0.81 (H) mmol/L    Comment:                                       Reference Range:                                       ADULT:    0.28 OR LESS . SAMPLE SLIGHTLY HEMOLYZED.   Urinalysis, Routine w reflex microscopic     Status: Abnormal   Collection Time: 11/05/17  3:05 PM  Result Value Ref Range   Color, Urine DARK YELLOW YELLOW   APPearance CLEAR CLEAR   Specific Gravity, Urine 1.012 1.001 - 1.03   pH < OR = 5.0 5.0 - 8.0   Glucose, UA TRACE (A) NEGATIVE   Bilirubin Urine NEGATIVE NEGATIVE    Comment: Verified by repeat analysis. Marland Kitchen    Ketones, ur NEGATIVE NEGATIVE   Hgb urine dipstick NEGATIVE NEGATIVE   Protein, ur  NEGATIVE NEGATIVE   Nitrite NEGATIVE NEGATIVE   Leukocytes, UA NEGATIVE NEGATIVE  Comprehensive metabolic panel     Status: Abnormal   Collection Time: 11/06/17  9:28 AM  Result Value Ref Range   Sodium 126 (L) 135 - 145 mmol/L   Potassium 3.5 3.5 - 5.1 mmol/L   Chloride 90 (L) 98 - 111 mmol/L   CO2 28 22 - 32 mmol/L   Glucose, Bld 105 (H) 70 - 99 mg/dL   BUN 50 (H) 8 - 23 mg/dL   Creatinine, Ser 1.83 (H) 0.61 - 1.24 mg/dL   Calcium 8.7 (L) 8.9 - 10.3 mg/dL   Total Protein 7.1 6.5 - 8.1 g/dL   Albumin 2.7 (L) 3.5 - 5.0 g/dL   AST 30 15 - 41 U/L   ALT 16 0 - 44 U/L   Alkaline Phosphatase 252 (H) 38 - 126 U/L   Total Bilirubin 2.5 (H) 0.3 - 1.2 mg/dL   GFR calc non Af Amer 38 (L) >60 mL/min   GFR calc Af Amer 44 (L) >60 mL/min    Comment: (NOTE) The eGFR has been calculated using the CKD EPI equation. This calculation has not been validated in all clinical situations. eGFR's persistently <60 mL/min signify possible Chronic Kidney Disease.    Anion gap 8 5 - 15    Comment: Performed at [facility], [address] [place], [place] [address]  Thyroid Panel With TSH     Status: Abnormal   Collection Time: 11/06/17  9:28 AM  Result Value Ref Range   TSH 13.180 (H) 0.450 - 4.500 uIU/mL   T4, Total 6.1 4.5 - 12.0 ug/dL   T3 Uptake Ratio 34 24 - 39 %   Free Thyroxine Index 2.1 1.2 - 4.9    Comment: (NOTE) Performed At: [facility] [address] [place], [place] [id number] Rush Farmer MD [id number]   CBC with Differential/Platelet     Status: Abnormal   Collection Time: 11/06/17  9:28 AM  Result Value Ref Range   WBC 3.0 (L) 4.0 - 10.5 K/uL   RBC 2.84 (L) 4.22 -  5.81 MIL/uL   Hemoglobin 8.0 (L) 13.0 - 17.0 g/dL   HCT 24.7 (L) 39.0 - 52.0 %   MCV 87.0 80.0 - 100.0 fL   MCH 28.2 26.0 - 34.0 pg   MCHC 32.4 30.0 - 36.0 g/dL   RDW 16.1 (H) 11.5 - 15.5 %   Platelets 140 (L) 150 - 400 K/uL   nRBC 0.0 0.0 - 0.2 %   Neutrophils Relative % 73 %   Neutro Abs  2.2 1.7 - 7.7 K/uL   Lymphocytes Relative 14 %   Lymphs Abs 0.4 (L) 0.7 - 4.0 K/uL   Monocytes Relative 10 %   Monocytes Absolute 0.3 0.1 - 1.0 K/uL   Eosinophils Relative 3 %   Eosinophils Absolute 0.1 0.0 - 0.5 K/uL   Basophils Relative 0 %   Basophils Absolute 0.0 0.0 - 0.1 K/uL   Immature Granulocytes 0 %   Abs Immature Granulocytes 0.01 0.00 - 0.07 K/uL    Comment: Performed at [facility], [address] [place], [place] [address]  Comprehensive metabolic panel     Status: Abnormal   Collection Time: 11/20/17  1:11 PM  Result Value Ref Range   Sodium 127 (L) 135 - 145 mmol/L   Potassium 4.0 3.5 - 5.1 mmol/L   Chloride 92 (L) 98 - 111 mmol/L   CO2 27 22 - 32 mmol/L   Glucose, Bld 143 (H) 70 - 99 mg/dL   BUN 37 (H) 8 - 23 mg/dL   Creatinine, Ser 1.44 (H) 0.61 - 1.24 mg/dL   Calcium 8.1 (L) 8.9 - 10.3 mg/dL   Total Protein 6.6 6.5 - 8.1 g/dL   Albumin 2.5 (L) 3.5 - 5.0 g/dL   AST 37 15 - 41 U/L   ALT 19 0 - 44 U/L   Alkaline Phosphatase 297 (H) 38 - 126 U/L   Total Bilirubin 1.9 (H) 0.3 - 1.2 mg/dL   GFR calc non Af Amer 51 (L) >60 mL/min   GFR calc Af Amer 59 (L) >60 mL/min    Comment: (NOTE) The eGFR has been calculated using the CKD EPI equation. This calculation has not been validated in all clinical situations. eGFR's persistently <60 mL/min signify possible Chronic Kidney Disease.    Anion gap 8 5 - 15    Comment: Performed at [facility], [address] [place], [place] [address]  CBC with Differential/Platelet     Status: Abnormal   Collection Time: 11/20/17  1:11 PM  Result Value Ref Range   WBC 3.3 (L) 4.0 - 10.5 K/uL   RBC 2.73 (L) 4.22 - 5.81 MIL/uL   Hemoglobin 7.6 (L) 13.0 - 17.0 g/dL   HCT 23.7 (L) 39.0 - 52.0 %   MCV 86.8 80.0 - 100.0 fL   MCH 27.8 26.0 - 34.0 pg   MCHC 32.1 30.0 - 36.0 g/dL   RDW 14.7 11.5 - 15.5 %   Platelets 101 (L) 150 - 400 K/uL    Comment: SPECIMEN CHECKED FOR CLOTS   nRBC 0.0 0.0 - 0.2 %   Neutrophils  Relative % 77 %   Neutro Abs 2.5 1.7 - 7.7 K/uL   Lymphocytes Relative 13 %   Lymphs Abs 0.4 (L) 0.7 - 4.0 K/uL   Monocytes Relative 8 %   Monocytes Absolute 0.3 0.1 - 1.0 K/uL   Eosinophils Relative 2 %   Eosinophils Absolute 0.1 0.0 - 0.5 K/uL   Basophils Relative 0 %   Basophils Absolute 0.0 0.0 -  0.1 K/uL   Immature Granulocytes 0 %   Abs Immature Granulocytes 0.01 0.00 - 0.07 K/uL    Comment: Performed at [facility], [address] [place], [place] [address]  Thyroid Panel With TSH     Status: Abnormal   Collection Time: 11/20/17  1:11 PM  Result Value Ref Range   TSH 11.140 (H) 0.450 - 4.500 uIU/mL   T4, Total 7.2 4.5 - 12.0 ug/dL   T3 Uptake Ratio 34 24 - 39 %   Free Thyroxine Index 2.4 1.2 - 4.9    Comment: (NOTE) Performed At: [facility] [place], [place] [id number] Rush Farmer MD NO:[id number]   CBC with Differential     Status: Abnormal   Collection Time: 12/12/17  8:15 AM  Result Value Ref Range   WBC 2.6 (L) 4.0 - 10.5 K/uL   RBC 3.07 (L) 4.22 - 5.81 MIL/uL   Hemoglobin 8.1 (L) 13.0 - 17.0 g/dL   HCT 25.8 (L) 39.0 - 52.0 %   MCV 84.0 80.0 - 100.0 fL   MCH 26.4 26.0 - 34.0 pg   MCHC 31.4 30.0 - 36.0 g/dL   RDW 13.7 11.5 - 15.5 %   Platelets 127 (L) 150 - 400 K/uL   nRBC 0.0 0.0 - 0.2 %   Neutrophils Relative % 73 %   Neutro Abs 1.9 1.7 - 7.7 K/uL   Lymphocytes Relative 15 %   Lymphs Abs 0.4 (L) 0.7 - 4.0 K/uL   Monocytes Relative 8 %   Monocytes Absolute 0.2 0.1 - 1.0 K/uL   Eosinophils Relative 4 %   Eosinophils Absolute 0.1 0.0 - 0.5 K/uL   Basophils Relative 0 %   Basophils Absolute 0.0 0.0 - 0.1 K/uL   Immature Granulocytes 0 %   Abs Immature Granulocytes 0.01 0.00 - 0.07 K/uL    Comment: Performed at [facility], [address] [place], [place] [address]  Comprehensive metabolic panel     Status: Abnormal   Collection Time: 12/12/17  8:15 AM  Result Value Ref Range   Sodium 134 (L) 135 - 145 mmol/L    Potassium 2.8 (L) 3.5 - 5.1 mmol/L   Chloride 100 98 - 111 mmol/L   CO2 26 22 - 32 mmol/L   Glucose, Bld 237 (H) 70 - 99 mg/dL   BUN 39 (H) 8 - 23 mg/dL   Creatinine, Ser 1.48 (H) 0.61 - 1.24 mg/dL   Calcium 7.9 (L) 8.9 - 10.3 mg/dL   Total Protein 6.7 6.5 - 8.1 g/dL   Albumin 2.2 (L) 3.5 - 5.0 g/dL   AST 27 15 - 41 U/L   ALT 17 0 - 44 U/L   Alkaline Phosphatase 259 (H) 38 - 126 U/L   Total Bilirubin 1.9 (H) 0.3 - 1.2 mg/dL   GFR calc non Af Amer 50 (L) >60 mL/min   GFR calc Af Amer 58 (L) >60 mL/min   Anion gap 8 5 - 15    Comment: Performed at [facility], [address] [place], [place] [address]  Lipase, blood     Status: None   Collection Time: 12/12/17  8:15 AM  Result Value Ref Range   Lipase 42 11 - 51 U/L    Comment: Performed at [facility], [address] [place], [place] [address]  Glucose, capillary     Status: Abnormal   Collection Time: 12/17/17 11:43 AM  Result Value Ref Range   Glucose-Capillary 113 (H)  70 - 99 mg/dL  Comprehensive metabolic panel     Status: Abnormal   Collection Time: 12/19/17  1:15 PM  Result Value Ref Range   Sodium 133 (L) 135 - 145 mmol/L   Potassium 3.3 (L) 3.5 - 5.1 mmol/L   Chloride 97 (L) 98 - 111 mmol/L   CO2 26 22 - 32 mmol/L   Glucose, Bld 140 (H) 70 - 99 mg/dL   BUN 37 (H) 8 - 23 mg/dL   Creatinine, Ser 1.59 (H) 0.61 - 1.24 mg/dL   Calcium 7.7 (L) 8.9 - 10.3 mg/dL   Total Protein 7.3 6.5 - 8.1 g/dL   Albumin 2.3 (L) 3.5 - 5.0 g/dL   AST 37 15 - 41 U/L   ALT 20 0 - 44 U/L   Alkaline Phosphatase 323 (H) 38 - 126 U/L   Total Bilirubin 1.8 (H) 0.3 - 1.2 mg/dL   GFR calc non Af Amer 46 (L) >60 mL/min   GFR calc Af Amer 54 (L) >60 mL/min   Anion gap 10 5 - 15    Comment: Performed at [facility], [address] [place], [place] [address]  CBC with Differential/Platelet     Status: Abnormal   Collection Time: 12/19/17  1:15 PM  Result Value Ref Range   WBC 3.0 (L) 4.0 - 10.5 K/uL   RBC 3.03  (L) 4.22 - 5.81 MIL/uL   Hemoglobin 7.5 (L) 13.0 - 17.0 g/dL   HCT 24.4 (L) 39.0 - 52.0 %   MCV 80.5 80.0 - 100.0 fL   MCH 24.8 (L) 26.0 - 34.0 pg   MCHC 30.7 30.0 - 36.0 g/dL   RDW 13.9 11.5 - 15.5 %   Platelets 136 (L) 150 - 400 K/uL   nRBC 0.0 0.0 - 0.2 %   Neutrophils Relative % 72 %   Neutro Abs 2.1 1.7 - 7.7 K/uL   Lymphocytes Relative 14 %   Lymphs Abs 0.4 (L) 0.7 - 4.0 K/uL   Monocytes Relative 9 %   Monocytes Absolute 0.3 0.1 - 1.0 K/uL   Eosinophils Relative 5 %   Eosinophils Absolute 0.2 0.0 - 0.5 K/uL   Basophils Relative 0 %   Basophils Absolute 0.0 0.0 - 0.1 K/uL   Immature Granulocytes 0 %   Abs Immature Granulocytes 0.01 0.00 - 0.07 K/uL    Comment: Performed at [facility], [address] [place], [place] [address]  Thyroid Panel With TSH     Status: Abnormal   Collection Time: 12/19/17  1:15 PM  Result Value Ref Range   TSH 9.470 (H) 0.450 - 4.500 uIU/mL   T4, Total 5.7 4.5 - 12.0 ug/dL   T3 Uptake Ratio 29 24 - 39 %   Free Thyroxine Index 1.7 1.2 - 4.9    Comment: (NOTE) Performed At: [facility] [place], [place] [id number] Rush Farmer MD [id number]    Objective  Body mass index is 24.8 kg/m. Wt Readings from Last 3 Encounters:  12/26/17 177 lb 12.8 oz (80.6 kg)  12/23/17 175 lb 6.4 oz (79.6 kg)  12/19/17 172 lb 8 oz (78.2 kg)   Temp Readings from Last 3 Encounters:  12/26/17 98.4 F (36.9 C) (Oral)  12/19/17 (!) 96.8 F (36 C) (Tympanic)  12/12/17 97.9 F (36.6 C) (Oral)   BP Readings from Last 3 Encounters:  12/26/17 132/70  12/23/17 125/75  12/19/17 (!) 153/88   Pulse Readings from Last 3 Encounters:  12/26/17 (!) 105  12/23/17 99  12/19/17 96    Physical Exam Vitals signs and nursing note reviewed.  Constitutional:      Appearance: He is ill-appearing.  HENT:     Head: Normocephalic and atraumatic.     Mouth/Throat:     Mouth: Mucous membranes are moist.     Pharynx: Oropharynx is  clear.  Eyes:     Conjunctiva/sclera: Conjunctivae normal.     Pupils: Pupils are equal, round, and reactive to light.  Cardiovascular:     Rate and Rhythm: Normal rate and regular rhythm.     Comments: Extra beats ?PVCS  Pulmonary:     Effort: Pulmonary effort is normal.     Breath sounds: Normal breath sounds.  Abdominal:     General: There is distension.     Tenderness: There is no abdominal tenderness.  Skin:    General: Skin is warm and dry.  Neurological:     General: No focal deficit present.     Mental Status: He is alert and oriented to person, place, and time. Mental status is at baseline.     Gait: Gait normal.  Psychiatric:        Attention and Perception: Attention and perception normal.        Mood and Affect: Mood and affect normal.        Speech: Speech normal.        Behavior: Behavior normal. Behavior is cooperative.        Thought Content: Thought content normal.        Cognition and Memory: Cognition and memory normal.        Judgment: Judgment normal.     Assessment   1.Liver cirrhosis with complications edema, ascities, hyrdocele 2. Constipation  3. Pancytopenia likely related to cancer, medication hbg 7.5  4. DM 2 A1C 8.9 09/23/17 with h/o hypoglycemia  5. Fall w/in the last week  6. HM 7. Hypothyroidism  Plan   1. F/u [facility] 01/15/2018  Fax with cover note  note to [facility] I need response please about tried spironolactone for leg edema and cirrhosis caused hyperK and elevated kidney #s what are their rec, any other alternatives fax to Dr. Olivia Mackie [name] at [phone]  Would they consider TIPS na now 133  On lasix 20-40 mg qd  Dr. Vicente Males states lactulose could be very low dose say 5-10 ml once at night or just try xifaxan 550 mg BID   Paracentesis sch 12/27 at 12:30 pm  2. Add colace prn to miralax prn and rec pt take lactuose as Rx by GI  3. F/u H/o upcoming appt  Already on iron daily and blood cts not trending up  Sent Dr. Grayland Ormond a  msg 4. Cont same dose insulin for now levemir 18 mg bid and SSI  5. Declines PT for now  6.  Had flu shot 09/16/17  Tdaputd pna 23 01/28/15 and prevnar 11/30/13  Disc shingrix in future   Former smoker PSA neg5/8/19 Colonoscopy get records [facility] [place] release signed though never received   [facility] urology saw 11/12/17 no surgery rec. rec supportive underwear and scrotal elevation for b/l painful hydroceles L>R rec optimize cirrhosis before surgery considered f/u prn Dr. Eulogio Bear   7. Increase levothyroxine 50 to 75 mg qd  Consider thyroid US in future in TSH does not normalize Provider: Dr. Olivia Mackie [name]-Internal Medicine

## 2017-12-26 NOTE — Progress Notes (Signed)
Note faxed electronmically

## 2017-12-27 ENCOUNTER — Other Ambulatory Visit: Payer: Self-pay | Admitting: *Deleted

## 2017-12-27 ENCOUNTER — Ambulatory Visit
Admission: RE | Admit: 2017-12-27 | Discharge: 2017-12-27 | Disposition: A | Discharge status: Home / Self Care | Payer: Medicare HMO | Source: Ambulatory Visit | Attending: Internal Medicine | Admitting: Internal Medicine

## 2017-12-27 ENCOUNTER — Other Ambulatory Visit: Payer: Self-pay

## 2017-12-27 DIAGNOSIS — K7031 Alcoholic cirrhosis of liver with ascites: Secondary | ICD-10-CM | POA: Diagnosis not present

## 2017-12-27 DIAGNOSIS — D649 Anemia, unspecified: Secondary | ICD-10-CM

## 2017-12-27 DIAGNOSIS — R69 Illness, unspecified: Secondary | ICD-10-CM | POA: Diagnosis not present

## 2017-12-27 DIAGNOSIS — R188 Other ascites: Secondary | ICD-10-CM

## 2017-12-27 DIAGNOSIS — K7460 Unspecified cirrhosis of liver: Secondary | ICD-10-CM

## 2017-12-27 MED ORDER — ALBUMIN HUMAN 25 % IV SOLN
25.0000 g | Freq: Once | INTRAVENOUS | Status: AC
  Administered 2017-12-27: 25 g via INTRAVENOUS

## 2017-12-27 MED ORDER — ALBUMIN HUMAN 25 % IV SOLN
INTRAVENOUS | Status: AC
  Filled 2017-12-27: qty 100

## 2017-12-27 MED ORDER — RIFAXIMIN 550 MG PO TABS: 550.0000 mg | ORAL_TABLET | Freq: Two times a day (BID) | ORAL | 2 refills | Status: AC

## 2017-12-27 NOTE — Telephone Encounter (Addendum)
Called pt to inform him of Dr. Georgeann Oppenheim directions to take a lower dose of the lactulose (5 ml) at night and to commence on xifaxan 550 mg twice daily. Unable to contact. LVM to return call

## 2017-12-27 NOTE — Telephone Encounter (Signed)
Spoke with pt and informed him of Dr. Georgeann Oppenheim directions to take only 5 ml of lactulose at night and pt is aware that we will send the prescription for xifaxan to his preferred pharmacy.

## 2017-12-30 ENCOUNTER — Ambulatory Visit: Payer: Medicare HMO | Admitting: Gastroenterology

## 2018-01-02 ENCOUNTER — Inpatient Hospital Stay: Payer: Medicare HMO

## 2018-01-09 ENCOUNTER — Inpatient Hospital Stay: Payer: Medicare HMO

## 2018-01-09 ENCOUNTER — Other Ambulatory Visit: Payer: Self-pay | Admitting: Oncology

## 2018-01-09 ENCOUNTER — Other Ambulatory Visit: Payer: Self-pay | Admitting: *Deleted

## 2018-01-09 ENCOUNTER — Inpatient Hospital Stay: Payer: Medicare HMO | Attending: Oncology

## 2018-01-09 VITALS — BP 136/85 | HR 100 | Temp 96.0°F | Resp 20 | Wt 180.0 lb

## 2018-01-09 DIAGNOSIS — D649 Anemia, unspecified: Secondary | ICD-10-CM

## 2018-01-09 DIAGNOSIS — A419 Sepsis, unspecified organism: Secondary | ICD-10-CM | POA: Diagnosis not present

## 2018-01-09 DIAGNOSIS — C7800 Secondary malignant neoplasm of unspecified lung: Secondary | ICD-10-CM

## 2018-01-09 DIAGNOSIS — C649 Malignant neoplasm of unspecified kidney, except renal pelvis: Secondary | ICD-10-CM

## 2018-01-09 DIAGNOSIS — C7802 Secondary malignant neoplasm of left lung: Principal | ICD-10-CM

## 2018-01-09 DIAGNOSIS — R531 Weakness: Secondary | ICD-10-CM | POA: Diagnosis not present

## 2018-01-09 LAB — COMPREHENSIVE METABOLIC PANEL
ALK PHOS: 358 U/L — AB (ref 38–126)
ALT: 20 U/L (ref 0–44)
ANION GAP: 6 (ref 5–15)
AST: 37 U/L (ref 15–41)
Albumin: 2 g/dL — ABNORMAL LOW (ref 3.5–5.0)
BUN: 27 mg/dL — ABNORMAL HIGH (ref 8–23)
CALCIUM: 7.8 mg/dL — AB (ref 8.9–10.3)
CO2: 28 mmol/L (ref 22–32)
Chloride: 100 mmol/L (ref 98–111)
Creatinine, Ser: 1.47 mg/dL — ABNORMAL HIGH (ref 0.61–1.24)
GFR calc Af Amer: 59 mL/min — ABNORMAL LOW (ref >60)
GFR calc non Af Amer: 51 mL/min — ABNORMAL LOW (ref >60)
Glucose, Bld: 156 mg/dL — ABNORMAL HIGH (ref 70–99)
Potassium: 3.5 mmol/L (ref 3.5–5.1)
Sodium: 134 mmol/L — ABNORMAL LOW (ref 135–145)
Total Bilirubin: 1.9 mg/dL — ABNORMAL HIGH (ref 0.3–1.2)
Total Protein: 7 g/dL (ref 6.5–8.1)

## 2018-01-09 LAB — CBC WITH DIFFERENTIAL/PLATELET
Abs Immature Granulocytes: 0.01 10*3/uL (ref 0.00–0.07)
Basophils Absolute: 0 10*3/uL (ref 0.0–0.1)
Basophils Relative: 0 %
Eosinophils Absolute: 0.1 10*3/uL (ref 0.0–0.5)
Eosinophils Relative: 4 %
HCT: 23.4 % — ABNORMAL LOW (ref 39.0–52.0)
Hemoglobin: 7 g/dL — ABNORMAL LOW (ref 13.0–17.0)
Immature Granulocytes: 0 %
Lymphocytes Relative: 10 %
Lymphs Abs: 0.3 10*3/uL — ABNORMAL LOW (ref 0.7–4.0)
MCH: 23.7 pg — ABNORMAL LOW (ref 26.0–34.0)
MCHC: 29.9 g/dL — AB (ref 30.0–36.0)
MCV: 79.3 fL — ABNORMAL LOW (ref 80.0–100.0)
Monocytes Absolute: 0.3 10*3/uL (ref 0.1–1.0)
Monocytes Relative: 8 %
Neutro Abs: 2.4 10*3/uL (ref 1.7–7.7)
Neutrophils Relative %: 78 %
Platelets: 129 10*3/uL — ABNORMAL LOW (ref 150–400)
RBC: 2.95 MIL/uL — AB (ref 4.22–5.81)
RDW: 15.6 % — ABNORMAL HIGH (ref 11.5–15.5)
WBC: 3.1 10*3/uL — ABNORMAL LOW (ref 4.0–10.5)
nRBC: 0 % (ref 0.0–0.2)

## 2018-01-09 MED ORDER — SODIUM CHLORIDE 0.9 % IV SOLN
Freq: Once | INTRAVENOUS | Status: AC
  Administered 2018-01-09: 15:00:00 via INTRAVENOUS
  Filled 2018-01-09: qty 250

## 2018-01-09 MED ORDER — SODIUM CHLORIDE 0.9 % IV SOLN
240.0000 mg | Freq: Once | INTRAVENOUS | Status: AC
  Administered 2018-01-09: 240 mg via INTRAVENOUS
  Filled 2018-01-09: qty 24

## 2018-01-10 ENCOUNTER — Other Ambulatory Visit: Payer: Self-pay

## 2018-01-10 ENCOUNTER — Emergency Department: Payer: Medicare HMO

## 2018-01-10 ENCOUNTER — Inpatient Hospital Stay
Admission: EM | Admit: 2018-01-10 | Discharge: 2018-01-21 | DRG: 871 | Disposition: A | Discharge status: Skilled Nursing Facility | Payer: Medicare HMO | Attending: Family Medicine | Admitting: Family Medicine

## 2018-01-10 ENCOUNTER — Ambulatory Visit: Payer: Medicare HMO

## 2018-01-10 ENCOUNTER — Inpatient Hospital Stay: Payer: Medicare HMO

## 2018-01-10 DIAGNOSIS — K7460 Unspecified cirrhosis of liver: Secondary | ICD-10-CM | POA: Diagnosis present

## 2018-01-10 DIAGNOSIS — N183 Chronic kidney disease, stage 3 (moderate): Secondary | ICD-10-CM | POA: Diagnosis not present

## 2018-01-10 DIAGNOSIS — K7210 Chronic hepatic failure without coma: Secondary | ICD-10-CM | POA: Diagnosis present

## 2018-01-10 DIAGNOSIS — E871 Hypo-osmolality and hyponatremia: Secondary | ICD-10-CM | POA: Diagnosis not present

## 2018-01-10 DIAGNOSIS — R6521 Severe sepsis with septic shock: Secondary | ICD-10-CM | POA: Diagnosis present

## 2018-01-10 DIAGNOSIS — R188 Other ascites: Secondary | ICD-10-CM

## 2018-01-10 DIAGNOSIS — M2550 Pain in unspecified joint: Secondary | ICD-10-CM | POA: Diagnosis not present

## 2018-01-10 DIAGNOSIS — Z66 Do not resuscitate: Secondary | ICD-10-CM | POA: Diagnosis present

## 2018-01-10 DIAGNOSIS — C649 Malignant neoplasm of unspecified kidney, except renal pelvis: Secondary | ICD-10-CM | POA: Diagnosis not present

## 2018-01-10 DIAGNOSIS — C7801 Secondary malignant neoplasm of right lung: Secondary | ICD-10-CM | POA: Diagnosis present

## 2018-01-10 DIAGNOSIS — Z6826 Body mass index (BMI) 26.0-26.9, adult: Secondary | ICD-10-CM

## 2018-01-10 DIAGNOSIS — G8929 Other chronic pain: Secondary | ICD-10-CM | POA: Diagnosis present

## 2018-01-10 DIAGNOSIS — R69 Illness, unspecified: Secondary | ICD-10-CM | POA: Diagnosis not present

## 2018-01-10 DIAGNOSIS — R404 Transient alteration of awareness: Secondary | ICD-10-CM | POA: Diagnosis not present

## 2018-01-10 DIAGNOSIS — Z8249 Family history of ischemic heart disease and other diseases of the circulatory system: Secondary | ICD-10-CM

## 2018-01-10 DIAGNOSIS — Z8041 Family history of malignant neoplasm of ovary: Secondary | ICD-10-CM

## 2018-01-10 DIAGNOSIS — K7031 Alcoholic cirrhosis of liver with ascites: Secondary | ICD-10-CM | POA: Diagnosis not present

## 2018-01-10 DIAGNOSIS — A419 Sepsis, unspecified organism: Principal | ICD-10-CM | POA: Diagnosis present

## 2018-01-10 DIAGNOSIS — E113399 Type 2 diabetes mellitus with moderate nonproliferative diabetic retinopathy without macular edema, unspecified eye: Secondary | ICD-10-CM | POA: Diagnosis present

## 2018-01-10 DIAGNOSIS — T68XXXA Hypothermia, initial encounter: Secondary | ICD-10-CM | POA: Diagnosis not present

## 2018-01-10 DIAGNOSIS — Z515 Encounter for palliative care: Secondary | ICD-10-CM | POA: Diagnosis not present

## 2018-01-10 DIAGNOSIS — Z86010 Personal history of colonic polyps: Secondary | ICD-10-CM

## 2018-01-10 DIAGNOSIS — E1122 Type 2 diabetes mellitus with diabetic chronic kidney disease: Secondary | ICD-10-CM | POA: Diagnosis present

## 2018-01-10 DIAGNOSIS — J9600 Acute respiratory failure, unspecified whether with hypoxia or hypercapnia: Secondary | ICD-10-CM | POA: Diagnosis not present

## 2018-01-10 DIAGNOSIS — J90 Pleural effusion, not elsewhere classified: Secondary | ICD-10-CM | POA: Diagnosis not present

## 2018-01-10 DIAGNOSIS — Z79891 Long term (current) use of opiate analgesic: Secondary | ICD-10-CM

## 2018-01-10 DIAGNOSIS — E43 Unspecified severe protein-calorie malnutrition: Secondary | ICD-10-CM | POA: Diagnosis present

## 2018-01-10 DIAGNOSIS — N400 Enlarged prostate without lower urinary tract symptoms: Secondary | ICD-10-CM | POA: Diagnosis present

## 2018-01-10 DIAGNOSIS — R000 Tachycardia, unspecified: Secondary | ICD-10-CM | POA: Diagnosis not present

## 2018-01-10 DIAGNOSIS — I129 Hypertensive chronic kidney disease with stage 1 through stage 4 chronic kidney disease, or unspecified chronic kidney disease: Secondary | ICD-10-CM | POA: Diagnosis present

## 2018-01-10 DIAGNOSIS — Z7982 Long term (current) use of aspirin: Secondary | ICD-10-CM

## 2018-01-10 DIAGNOSIS — I2510 Atherosclerotic heart disease of native coronary artery without angina pectoris: Secondary | ICD-10-CM | POA: Diagnosis present

## 2018-01-10 DIAGNOSIS — M6281 Muscle weakness (generalized): Secondary | ICD-10-CM | POA: Diagnosis not present

## 2018-01-10 DIAGNOSIS — R64 Cachexia: Secondary | ICD-10-CM | POA: Diagnosis present

## 2018-01-10 DIAGNOSIS — E1142 Type 2 diabetes mellitus with diabetic polyneuropathy: Secondary | ICD-10-CM | POA: Diagnosis present

## 2018-01-10 DIAGNOSIS — R0603 Acute respiratory distress: Secondary | ICD-10-CM | POA: Diagnosis not present

## 2018-01-10 DIAGNOSIS — D6959 Other secondary thrombocytopenia: Secondary | ICD-10-CM | POA: Diagnosis present

## 2018-01-10 DIAGNOSIS — E1165 Type 2 diabetes mellitus with hyperglycemia: Secondary | ICD-10-CM | POA: Diagnosis present

## 2018-01-10 DIAGNOSIS — R069 Unspecified abnormalities of breathing: Secondary | ICD-10-CM | POA: Diagnosis not present

## 2018-01-10 DIAGNOSIS — C642 Malignant neoplasm of left kidney, except renal pelvis: Secondary | ICD-10-CM | POA: Diagnosis not present

## 2018-01-10 DIAGNOSIS — J181 Lobar pneumonia, unspecified organism: Secondary | ICD-10-CM | POA: Diagnosis not present

## 2018-01-10 DIAGNOSIS — N179 Acute kidney failure, unspecified: Secondary | ICD-10-CM | POA: Diagnosis not present

## 2018-01-10 DIAGNOSIS — Z794 Long term (current) use of insulin: Secondary | ICD-10-CM

## 2018-01-10 DIAGNOSIS — R531 Weakness: Secondary | ICD-10-CM | POA: Diagnosis not present

## 2018-01-10 DIAGNOSIS — J9601 Acute respiratory failure with hypoxia: Secondary | ICD-10-CM | POA: Diagnosis present

## 2018-01-10 DIAGNOSIS — M109 Gout, unspecified: Secondary | ICD-10-CM | POA: Diagnosis present

## 2018-01-10 DIAGNOSIS — R180 Malignant ascites: Secondary | ICD-10-CM | POA: Diagnosis not present

## 2018-01-10 DIAGNOSIS — J189 Pneumonia, unspecified organism: Secondary | ICD-10-CM

## 2018-01-10 DIAGNOSIS — K7011 Alcoholic hepatitis with ascites: Secondary | ICD-10-CM | POA: Diagnosis not present

## 2018-01-10 DIAGNOSIS — J168 Pneumonia due to other specified infectious organisms: Secondary | ICD-10-CM | POA: Diagnosis not present

## 2018-01-10 DIAGNOSIS — D638 Anemia in other chronic diseases classified elsewhere: Secondary | ICD-10-CM | POA: Diagnosis present

## 2018-01-10 DIAGNOSIS — I10 Essential (primary) hypertension: Secondary | ICD-10-CM | POA: Diagnosis not present

## 2018-01-10 DIAGNOSIS — Z87891 Personal history of nicotine dependence: Secondary | ICD-10-CM

## 2018-01-10 DIAGNOSIS — E119 Type 2 diabetes mellitus without complications: Secondary | ICD-10-CM | POA: Diagnosis not present

## 2018-01-10 DIAGNOSIS — D631 Anemia in chronic kidney disease: Secondary | ICD-10-CM | POA: Diagnosis present

## 2018-01-10 DIAGNOSIS — E039 Hypothyroidism, unspecified: Secondary | ICD-10-CM | POA: Diagnosis present

## 2018-01-10 DIAGNOSIS — Z7401 Bed confinement status: Secondary | ICD-10-CM | POA: Diagnosis not present

## 2018-01-10 DIAGNOSIS — Z79890 Hormone replacement therapy (postmenopausal): Secondary | ICD-10-CM

## 2018-01-10 DIAGNOSIS — Z79899 Other long term (current) drug therapy: Secondary | ICD-10-CM

## 2018-01-10 DIAGNOSIS — C7800 Secondary malignant neoplasm of unspecified lung: Secondary | ICD-10-CM | POA: Diagnosis not present

## 2018-01-10 DIAGNOSIS — K219 Gastro-esophageal reflux disease without esophagitis: Secondary | ICD-10-CM | POA: Diagnosis present

## 2018-01-10 DIAGNOSIS — E1100 Type 2 diabetes mellitus with hyperosmolarity without nonketotic hyperglycemic-hyperosmolar coma (NKHHC): Secondary | ICD-10-CM | POA: Diagnosis not present

## 2018-01-10 DIAGNOSIS — Z905 Acquired absence of kidney: Secondary | ICD-10-CM

## 2018-01-10 DIAGNOSIS — Z833 Family history of diabetes mellitus: Secondary | ICD-10-CM

## 2018-01-10 DIAGNOSIS — C7802 Secondary malignant neoplasm of left lung: Secondary | ICD-10-CM | POA: Diagnosis not present

## 2018-01-10 DIAGNOSIS — K429 Umbilical hernia without obstruction or gangrene: Secondary | ICD-10-CM | POA: Diagnosis present

## 2018-01-10 DIAGNOSIS — R112 Nausea with vomiting, unspecified: Secondary | ICD-10-CM | POA: Diagnosis not present

## 2018-01-10 LAB — INFLUENZA PANEL BY PCR (TYPE A & B)
Influenza A By PCR: NEGATIVE
Influenza B By PCR: NEGATIVE

## 2018-01-10 LAB — CBC WITH DIFFERENTIAL/PLATELET
Abs Immature Granulocytes: 0.05 10*3/uL (ref 0.00–0.07)
Basophils Absolute: 0 10*3/uL (ref 0.0–0.1)
Basophils Relative: 0 %
Eosinophils Absolute: 0 10*3/uL (ref 0.0–0.5)
Eosinophils Relative: 0 %
HCT: 35 % — ABNORMAL LOW (ref 39.0–52.0)
Hemoglobin: 10.4 g/dL — ABNORMAL LOW (ref 13.0–17.0)
Immature Granulocytes: 0 %
Lymphocytes Relative: 3 %
Lymphs Abs: 0.4 10*3/uL — ABNORMAL LOW (ref 0.7–4.0)
MCH: 23.9 pg — ABNORMAL LOW (ref 26.0–34.0)
MCHC: 29.7 g/dL — ABNORMAL LOW (ref 30.0–36.0)
MCV: 80.3 fL (ref 80.0–100.0)
MONOS PCT: 4 %
Monocytes Absolute: 0.4 10*3/uL (ref 0.1–1.0)
Neutro Abs: 11 10*3/uL — ABNORMAL HIGH (ref 1.7–7.7)
Neutrophils Relative %: 93 %
Platelets: 258 10*3/uL (ref 150–400)
RBC: 4.36 MIL/uL (ref 4.22–5.81)
RDW: 15.8 % — ABNORMAL HIGH (ref 11.5–15.5)
WBC: 11.9 10*3/uL — ABNORMAL HIGH (ref 4.0–10.5)
nRBC: 0 % (ref 0.0–0.2)

## 2018-01-10 LAB — PROCALCITONIN: Procalcitonin: 0.58 ng/mL

## 2018-01-10 LAB — COMPREHENSIVE METABOLIC PANEL
ALT: 21 U/L (ref 0–44)
AST: 46 U/L — ABNORMAL HIGH (ref 15–41)
Albumin: 2.3 g/dL — ABNORMAL LOW (ref 3.5–5.0)
Alkaline Phosphatase: 403 U/L — ABNORMAL HIGH (ref 38–126)
Anion gap: 13 (ref 5–15)
BUN: 26 mg/dL — ABNORMAL HIGH (ref 8–23)
CO2: 25 mmol/L (ref 22–32)
Calcium: 8.2 mg/dL — ABNORMAL LOW (ref 8.9–10.3)
Chloride: 98 mmol/L (ref 98–111)
Creatinine, Ser: 1.29 mg/dL — ABNORMAL HIGH (ref 0.61–1.24)
GFR calc Af Amer: >60 mL/min (ref >60)
GFR, EST NON AFRICAN AMERICAN: 59 mL/min — AB (ref >60)
Glucose, Bld: 85 mg/dL (ref 70–99)
Potassium: 3.7 mmol/L (ref 3.5–5.1)
Sodium: 136 mmol/L (ref 135–145)
Total Bilirubin: 2.6 mg/dL — ABNORMAL HIGH (ref 0.3–1.2)
Total Protein: 8.1 g/dL (ref 6.5–8.1)

## 2018-01-10 LAB — THYROID PANEL WITH TSH
Free Thyroxine Index: 2.6 (ref 1.2–4.9)
T3 Uptake Ratio: 34 % (ref 24–39)
T4 TOTAL: 7.7 ug/dL (ref 4.5–12.0)
TSH: 4.41 u[IU]/mL (ref 0.450–4.500)

## 2018-01-10 LAB — AMMONIA: AMMONIA: 39 umol/L — AB (ref 9–35)

## 2018-01-10 LAB — LIPASE, BLOOD: Lipase: 33 U/L (ref 11–51)

## 2018-01-10 LAB — CG4 I-STAT (LACTIC ACID): Lactic Acid, Venous: 4.26 mmol/L (ref 0.5–1.9)

## 2018-01-10 LAB — APTT: aPTT: 39 seconds — ABNORMAL HIGH (ref 24–36)

## 2018-01-10 LAB — PROTIME-INR
INR: 1.25
Prothrombin Time: 15.6 seconds — ABNORMAL HIGH (ref 11.4–15.2)

## 2018-01-10 LAB — TROPONIN I: Troponin I: <0.03 ng/mL (ref <0.03)

## 2018-01-10 MED ORDER — LACTULOSE 10 GM/15ML PO SOLN
30.0000 g | Freq: Two times a day (BID) | ORAL | Status: DC
  Administered 2018-01-11 – 2018-01-17 (×11): 30 g via ORAL
  Filled 2018-01-10 (×21): qty 60

## 2018-01-10 MED ORDER — METRONIDAZOLE IN NACL 5-0.79 MG/ML-% IV SOLN
500.0000 mg | Freq: Three times a day (TID) | INTRAVENOUS | Status: DC
  Administered 2018-01-10 – 2018-01-13 (×9): 500 mg via INTRAVENOUS
  Filled 2018-01-10 (×12): qty 100

## 2018-01-10 MED ORDER — VANCOMYCIN HCL 10 G IV SOLR
2000.0000 mg | Freq: Once | INTRAVENOUS | Status: DC
  Filled 2018-01-10: qty 2000

## 2018-01-10 MED ORDER — ENSURE ENLIVE PO LIQD
237.0000 mL | Freq: Three times a day (TID) | ORAL | Status: DC
  Administered 2018-01-13 – 2018-01-20 (×17): 237 mL via ORAL

## 2018-01-10 MED ORDER — ACETAMINOPHEN 650 MG RE SUPP: 650.0000 mg | Freq: Four times a day (QID) | RECTAL | Status: DC | PRN

## 2018-01-10 MED ORDER — LEVOTHYROXINE SODIUM 50 MCG PO TABS
75.0000 ug | ORAL_TABLET | Freq: Every day | ORAL | Status: DC
  Administered 2018-01-12 – 2018-01-13 (×2): 75 ug via ORAL
  Filled 2018-01-10 (×3): qty 1
  Filled 2018-01-10: qty 2

## 2018-01-10 MED ORDER — SODIUM CHLORIDE 0.9 % IV BOLUS (SEPSIS)
1000.0000 mL | Freq: Once | INTRAVENOUS | Status: AC
  Administered 2018-01-10: 1000 mL via INTRAVENOUS

## 2018-01-10 MED ORDER — PRAVASTATIN SODIUM 20 MG PO TABS
20.0000 mg | ORAL_TABLET | Freq: Every day | ORAL | Status: DC
  Administered 2018-01-12 – 2018-01-20 (×7): 20 mg via ORAL
  Filled 2018-01-10 (×9): qty 1

## 2018-01-10 MED ORDER — ALLOPURINOL 100 MG PO TABS
100.0000 mg | ORAL_TABLET | Freq: Every day | ORAL | Status: DC
  Administered 2018-01-12 – 2018-01-21 (×9): 100 mg via ORAL
  Filled 2018-01-10 (×11): qty 1

## 2018-01-10 MED ORDER — ONDANSETRON HCL 4 MG/2ML IJ SOLN
4.0000 mg | Freq: Four times a day (QID) | INTRAMUSCULAR | Status: DC | PRN
  Administered 2018-01-11 – 2018-01-16 (×5): 4 mg via INTRAVENOUS
  Filled 2018-01-10 (×6): qty 2

## 2018-01-10 MED ORDER — SODIUM CHLORIDE 0.9 % IV SOLN
250.0000 mL | INTRAVENOUS | Status: DC | PRN
  Administered 2018-01-11 – 2018-01-14 (×2): 250 mL via INTRAVENOUS

## 2018-01-10 MED ORDER — DOCUSATE SODIUM 100 MG PO CAPS: 100.0000 mg | ORAL_CAPSULE | Freq: Two times a day (BID) | ORAL | Status: DC | PRN

## 2018-01-10 MED ORDER — VANCOMYCIN HCL 10 G IV SOLR
1500.0000 mg | Freq: Once | INTRAVENOUS | Status: DC
  Filled 2018-01-10: qty 1500

## 2018-01-10 MED ORDER — RIFAXIMIN 550 MG PO TABS
550.0000 mg | ORAL_TABLET | Freq: Two times a day (BID) | ORAL | Status: DC
  Administered 2018-01-11 – 2018-01-21 (×19): 550 mg via ORAL
  Filled 2018-01-10 (×21): qty 1

## 2018-01-10 MED ORDER — SODIUM CHLORIDE 0.9% FLUSH
3.0000 mL | Freq: Two times a day (BID) | INTRAVENOUS | Status: DC
  Administered 2018-01-11 – 2018-01-15 (×10): 3 mL via INTRAVENOUS

## 2018-01-10 MED ORDER — VANCOMYCIN HCL 10 G IV SOLR
1750.0000 mg | Freq: Once | INTRAVENOUS | Status: AC
  Administered 2018-01-11: 01:00:00 1750 mg via INTRAVENOUS
  Filled 2018-01-10 (×2): qty 1750

## 2018-01-10 MED ORDER — ENOXAPARIN SODIUM 40 MG/0.4ML ~~LOC~~ SOLN: 40.0000 mg | SUBCUTANEOUS | Status: DC

## 2018-01-10 MED ORDER — SODIUM CHLORIDE 0.9% FLUSH
3.0000 mL | INTRAVENOUS | Status: DC | PRN
  Administered 2018-01-11: 3 mL via INTRAVENOUS
  Filled 2018-01-10: qty 3

## 2018-01-10 MED ORDER — VANCOMYCIN HCL 10 G IV SOLR
1750.0000 mg | INTRAVENOUS | Status: DC
  Administered 2018-01-11 – 2018-01-12 (×2): 1750 mg via INTRAVENOUS
  Filled 2018-01-10 (×4): qty 1750

## 2018-01-10 MED ORDER — VANCOMYCIN HCL IN DEXTROSE 1-5 GM/200ML-% IV SOLN: 1000.0000 mg | Freq: Once | INTRAVENOUS | Status: DC

## 2018-01-10 MED ORDER — SODIUM CHLORIDE 0.9 % IV BOLUS (SEPSIS)
500.0000 mL | Freq: Once | INTRAVENOUS | Status: AC
  Administered 2018-01-11: 500 mL via INTRAVENOUS

## 2018-01-10 MED ORDER — SODIUM CHLORIDE 0.9 % IV SOLN
2.0000 g | Freq: Once | INTRAVENOUS | Status: AC
  Administered 2018-01-10: 2 g via INTRAVENOUS
  Filled 2018-01-10: qty 2

## 2018-01-10 MED ORDER — ASPIRIN EC 81 MG PO TBEC
81.0000 mg | DELAYED_RELEASE_TABLET | Freq: Every day | ORAL | Status: DC
  Administered 2018-01-13 – 2018-01-21 (×9): 81 mg via ORAL
  Filled 2018-01-10 (×10): qty 1

## 2018-01-10 MED ORDER — SODIUM CHLORIDE 0.9 % IV SOLN
2.0000 g | Freq: Three times a day (TID) | INTRAVENOUS | Status: DC
  Administered 2018-01-11 – 2018-01-13 (×7): 2 g via INTRAVENOUS
  Filled 2018-01-10 (×10): qty 2

## 2018-01-10 MED ORDER — SODIUM CHLORIDE 0.9 % IV BOLUS: 1000.0000 mL | Freq: Once | INTRAVENOUS | Status: DC

## 2018-01-10 MED ORDER — ONDANSETRON HCL 4 MG PO TABS: 4.0000 mg | ORAL_TABLET | Freq: Four times a day (QID) | ORAL | Status: DC | PRN

## 2018-01-10 MED ORDER — POLYETHYLENE GLYCOL 3350 17 G PO PACK
17.0000 g | PACK | Freq: Every day | ORAL | Status: DC
  Administered 2018-01-12 – 2018-01-19 (×4): 17 g via ORAL
  Filled 2018-01-10 (×7): qty 1

## 2018-01-10 MED ORDER — ACETAMINOPHEN 325 MG PO TABS: 650.0000 mg | ORAL_TABLET | Freq: Four times a day (QID) | ORAL | Status: DC | PRN

## 2018-01-10 MED ORDER — TAMSULOSIN HCL 0.4 MG PO CAPS
0.4000 mg | ORAL_CAPSULE | ORAL | Status: DC
  Administered 2018-01-13 – 2018-01-19 (×4): 0.4 mg via ORAL
  Filled 2018-01-10 (×8): qty 1

## 2018-01-10 NOTE — ED Notes (Signed)
ED TO INPATIENT HANDOFF REPORT  Name/Age/Gender Rodney Stewart 63 y.o. male  Code Status Code Status History    Date Active Date Inactive Code Status Order ID Comments User Context   10/19/2017 0011 10/22/2017 2047 DNR [id number]  Lance Coon, MD Inpatient   09/16/2017 1346 09/18/2017 1544 DNR [id number]  Jimmy Footman, NP Inpatient   09/14/2017 0254 09/16/2017 1346 Full Code [id number]  Amelia Jo, MD Inpatient   01/25/2017 1037 01/27/2017 1704 Full Code [id number]  Bettey Costa, MD ED   09/30/2015 1609 10/03/2015 1530 Full Code [id number]  Demetrios Loll, MD Inpatient    Questions for Most Recent Historical Code Status (Order [id number])    Question Answer Comment   In the event of cardiac or respiratory ARREST Do not call a "code blue"    In the event of cardiac or respiratory ARREST Do not perform Intubation, CPR, defibrillation or ACLS    In the event of cardiac or respiratory ARREST Use medication by any route, position, wound care, and other measures to relive pain and suffering. May use oxygen, suction and manual treatment of airway obstruction as needed for comfort.       Home/SNF/Other Home  Chief Complaint Generalized Weakness  Level of Care/Admitting Diagnosis ED Disposition    ED Disposition Condition [facility] Area: [place] [[id number]]  Level of Care: Med-Surg [16]  Diagnosis: Sepsis [facility]) [[id number]]  Admitting Physician: Saundra Shelling [[id number]]  Attending Physician: Saundra Shelling [[id number]]  Estimated length of stay: past midnight tomorrow  Certification:: I certify this patient will need inpatient services for at least 2 midnights  PT Class (Do Not Modify): Inpatient [101]  PT Acc Code (Do Not Modify): Private [1]       Medical History Past Medical History:  Diagnosis Date  . Anemia   . Ascites   . BPH (benign prostatic hyperplasia)   . Cirrhosis ([place])   . CKD (chronic kidney disease) stage 3, GFR 30-59 ml/min  (HCC)   . Clear cell renal cell carcinoma ([place]) 2014   Left Nephrectomy.  . Colon polyps   . Diabetes mellitus without complication ([place])    type 2   . Dyspnea    with exertion  . GERD (gastroesophageal reflux disease)   . History of gout   . History of nephrectomy    Left  . Hypertension   . Neuropathy   . Renal Cancer    Renal Cancer  . Renal cell carcinoma of left kidney (HCC)    mets to lungs  . Umbilical hernia     Allergies No Known Allergies  IV Location/Drains/Wounds Patient Lines/Drains/Airways Status   Active Line/Drains/Airways    Name:   Placement date:   Placement time:   Site:   Days:   Peripheral IV 10/18/17 Right Antecubital   10/18/17    1343    Antecubital   84   Peripheral IV 11/15/17   11/15/17    1410    -   56   Peripheral IV 12/12/17 Right Antecubital   12/12/17    0816    Antecubital   29   Peripheral IV 12/27/17 Left Arm   12/27/17    1409    Arm   14   Peripheral IV 01/09/18 Right Antecubital   01/09/18    1439    Antecubital   1   Peripheral IV 01/10/18 Left Antecubital   01/10/18    1946    Antecubital  less than 1          Labs/Imaging Results for orders placed or performed during the hospital encounter of 01/10/18 (from the past 48 hour(s))  Comprehensive metabolic panel     Status: Abnormal   Collection Time: 01/10/18  7:30 PM  Result Value Ref Range   Sodium 136 135 - 145 mmol/L   Potassium 3.7 3.5 - 5.1 mmol/L   Chloride 98 98 - 111 mmol/L   CO2 25 22 - 32 mmol/L   Glucose, Bld 85 70 - 99 mg/dL   BUN 26 (H) 8 - 23 mg/dL   Creatinine, Ser 1.29 (H) 0.61 - 1.24 mg/dL   Calcium 8.2 (L) 8.9 - 10.3 mg/dL   Total Protein 8.1 6.5 - 8.1 g/dL   Albumin 2.3 (L) 3.5 - 5.0 g/dL   AST 46 (H) 15 - 41 U/L   ALT 21 0 - 44 U/L   Alkaline Phosphatase 403 (H) 38 - 126 U/L   Total Bilirubin 2.6 (H) 0.3 - 1.2 mg/dL   GFR calc non Af Amer 59 (L) >60 mL/min   GFR calc Af Amer >60 >60 mL/min   Anion gap 13 5 - 15    Comment: Performed at [facility], [address] [place], [place] [address]  Lipase, blood     Status: None   Collection Time: 01/10/18  7:30 PM  Result Value Ref Range   Lipase 33 11 - 51 U/L    Comment: Performed at [facility], [address] [place], [place] [address]  Troponin I - ONCE - STAT     Status: None   Collection Time: 01/10/18  7:30 PM  Result Value Ref Range   Troponin I <0.03 <0.03 ng/mL    Comment: Performed at [facility], [address] [place], [place] [address]  CBC WITH DIFFERENTIAL     Status: Abnormal   Collection Time: 01/10/18  7:30 PM  Result Value Ref Range   WBC 11.9 (H) 4.0 - 10.5 K/uL   RBC 4.36 4.22 - 5.81 MIL/uL   Hemoglobin 10.4 (L) 13.0 - 17.0 g/dL    Comment: POST TRANSFUSION SPECIMEN   HCT 35.0 (L) 39.0 - 52.0 %   MCV 80.3 80.0 - 100.0 fL   MCH 23.9 (L) 26.0 - 34.0 pg   MCHC 29.7 (L) 30.0 - 36.0 g/dL   RDW 15.8 (H) 11.5 - 15.5 %   Platelets 258 150 - 400 K/uL   nRBC 0.0 0.0 - 0.2 %   Neutrophils Relative % 93 %   Neutro Abs 11.0 (H) 1.7 - 7.7 K/uL   Lymphocytes Relative 3 %   Lymphs Abs 0.4 (L) 0.7 - 4.0 K/uL   Monocytes Relative 4 %   Monocytes Absolute 0.4 0.1 - 1.0 K/uL   Eosinophils Relative 0 %   Eosinophils Absolute 0.0 0.0 - 0.5 K/uL   Basophils Relative 0 %   Basophils Absolute 0.0 0.0 - 0.1 K/uL   Immature Granulocytes 0 %   Abs Immature Granulocytes 0.05 0.00 - 0.07 K/uL    Comment: Performed at [facility], [address] [place], [place] [address]  Procalcitonin     Status: None   Collection Time: 01/10/18  7:30 PM  Result Value Ref Range   Procalcitonin 0.58 ng/mL    Comment:        Interpretation: PCT > 0.5 ng/mL and <= 2 ng/mL: Systemic infection (sepsis) is possible, but other conditions are known to  elevate PCT as well. (NOTE)       Sepsis PCT Algorithm           Lower Respiratory Tract                                      Infection PCT Algorithm    ----------------------------      ----------------------------         PCT < 0.25 ng/mL                PCT < 0.10 ng/mL         Strongly encourage             Strongly discourage   discontinuation of antibiotics    initiation of antibiotics    ----------------------------     -----------------------------       PCT 0.25 - 0.50 ng/mL            PCT 0.10 - 0.25 ng/mL               OR       >80% decrease in PCT            Discourage initiation of                                            antibiotics      Encourage discontinuation           of antibiotics    ----------------------------     -----------------------------         PCT >= 0.50 ng/mL              PCT 0.26 - 0.50 ng/mL                AND       <80% decrease in PCT             Encourage initiation of                                             antibiotics       Encourage continuation           of antibiotics    ----------------------------     -----------------------------        PCT >= 0.50 ng/mL                  PCT > 0.50 ng/mL               AND         increase in PCT                  Strongly encourage                                      initiation of antibiotics    Strongly encourage escalation           of antibiotics                                     -----------------------------  PCT <= 0.25 ng/mL                                                 OR                                        > 80% decrease in PCT                                     Discontinue / Do not initiate                                             antibiotics Performed at [facility], [address] [place], [place] [address]   APTT     Status: Abnormal   Collection Time: 01/10/18  7:30 PM  Result Value Ref Range   aPTT 39 (H) 24 - 36 seconds    Comment:        IF BASELINE aPTT IS ELEVATED, SUGGEST PATIENT RISK ASSESSMENT BE USED TO DETERMINE APPROPRIATE ANTICOAGULANT THERAPY. Performed at [facility], [address] [place], [place] [address]   Protime-INR     Status: Abnormal   Collection Time: 01/10/18  7:30 PM  Result Value Ref Range   Prothrombin Time 15.6 (H) 11.4 - 15.2 seconds   INR 1.25     Comment: Performed at [facility], [address] [place], [place] [address]  Influenza panel by PCR (type A & B)     Status: None   Collection Time: 01/10/18  7:30 PM  Result Value Ref Range   Influenza A By PCR NEGATIVE NEGATIVE   Influenza B By PCR NEGATIVE NEGATIVE    Comment: (NOTE) The Xpert Xpress Flu assay is intended as an aid in the diagnosis of  influenza and should not be used as a sole basis for treatment.  This  assay is FDA approved for nasopharyngeal swab specimens only. Nasal  washings and aspirates are unacceptable for Xpert Xpress Flu testing. Performed at [facility], [address] [place], [place] [address]   CG4 I-STAT (Lactic acid)     Status: Abnormal   Collection Time: 01/10/18  7:34 PM  Result Value Ref Range   Lactic Acid, Venous 4.26 (HH) 0.5 - 1.9 mmol/L   Comment NOTIFIED PHYSICIAN   Ammonia     Status: Abnormal   Collection Time: 01/10/18  8:00 PM  Result Value Ref Range   Ammonia 39 (H) 9 - 35 umol/L    Comment: Performed at [facility], [address] [place], [place] [address]   Dg Chest Port 1 View  Result Date: 01/10/2018 CLINICAL DATA:  Chills and nausea. Previous history of renal cell carcinoma metastatic to lung. EXAM: PORTABLE CHEST 1 VIEW COMPARISON:  12/12/2017 FINDINGS: Shallow inspiration. Heart size and pulmonary vascularity are normal for technique. Suggestion of infiltration or atelectasis in the left lung base. Probable small left pleural effusion as seen previously. Could consider pneumonia in the appropriate clinical setting. No pneumothorax. Calcification of the aorta. Degenerative changes in  the shoulders. IMPRESSION: Infiltration or atelectasis in the left lung base with small left pleural effusion.  Consider pneumonia in the appropriate clinical setting. Electronically Signed   By: Lucienne Capers M.D.   On: 01/10/2018 20:00    Pending Labs Unresulted Labs (From admission, onward)    Start     Ordered   01/11/18 2106  Lactic acid, plasma  Once-Timed,   STAT     01/10/18 2106   01/11/18 0100  Lactic acid, plasma  Once,   STAT     01/10/18 2038   01/10/18 1922  Blood Culture (routine x 2)  BLOOD CULTURE X 2,   STAT     01/10/18 1922   01/10/18 1922  Urinalysis, Complete w Microscopic  ONCE - STAT,   STAT     01/10/18 1922   01/10/18 1922  Urine culture  ONCE - STAT,   STAT     01/10/18 1922   Signed and Held  CBC  (enoxaparin (LOVENOX)    CrCl >/= 30 ml/min)  Once,   R    Comments:  Baseline for enoxaparin therapy IF NOT ALREADY DRAWN.  Notify MD if PLT < 100 K.    Signed and Held   Signed and Held  Creatinine, serum  (enoxaparin (LOVENOX)    CrCl >/= 30 ml/min)  Once,   R    Comments:  Baseline for enoxaparin therapy IF NOT ALREADY DRAWN.    Signed and Held   Signed and Held  Creatinine, serum  (enoxaparin (LOVENOX)    CrCl >/= 30 ml/min)  Weekly,   R    Comments:  while on enoxaparin therapy    Signed and Held   Signed and Held  Basic metabolic panel  Tomorrow morning,   R     Signed and Held   Signed and Held  CBC  Tomorrow morning,   R     Signed and Held          Vitals/Pain Today's Vitals   01/10/18 2045 01/10/18 2100 01/10/18 2115 01/10/18 2130  BP: 132/78 134/79 137/76 134/82  Pulse: (!) 103 (!) 102 (!) 102 (!) 103  Resp: 15 15 17 11   Temp:      TempSrc:      SpO2: 97% 96% 98% 98%  PainSc:        Isolation Precautions Droplet precaution  Medications Medications  metroNIDAZOLE (FLAGYL) IVPB 500 mg (500 mg Intravenous New Bag/Given 01/10/18 2102)  vancomycin (VANCOCIN) 1,750 mg in sodium chloride 0.9 % 500 mL IVPB (has no administration in time range)  sodium chloride 0.9 % bolus 1,000 mL (0 mLs Intravenous Stopped 01/10/18 2102)    And  sodium  chloride 0.9 % bolus 1,000 mL (1,000 mLs Intravenous New Bag/Given 01/10/18 2102)    And  sodium chloride 0.9 % bolus 500 mL (has no administration in time range)  lactulose (CHRONULAC) 10 GM/15ML solution 30 g (has no administration in time range)  ceFEPIme (MAXIPIME) 2 g in sodium chloride 0.9 % 100 mL IVPB (has no administration in time range)  vancomycin (VANCOCIN) 1,750 mg in sodium chloride 0.9 % 500 mL IVPB (has no administration in time range)  ceFEPIme (MAXIPIME) 2 g in sodium chloride 0.9 % 100 mL IVPB (0 g Intravenous Stopped 01/10/18 2102)    Mobility walks

## 2018-01-10 NOTE — ED Triage Notes (Signed)
Pt here from home with chills and nausea.

## 2018-01-10 NOTE — Progress Notes (Signed)
Pharmacy Antibiotic Note  Rodney Stewart is a 63 y.o. male admitted on 01/10/2018 with sepsis.  Pharmacy has been consulted for Vancomycin , Cefepime dosing.  Plan: Cefepime 2 gm IV X 1 given in ED on 1/10 @ 1947. Cefepime 2 gm IV Q8H ordered to start on 1/11 @ 0400.   Vancomycin 1750 mg IV X 1 given in ED on 1/10 @ ~ 2200. Vancomycin 1750 mg IV Q24H ordered to continue on 1/11 @ 2200.  Goal AUC 400-550. Expected AUC: 517.5  SCr used: 1.29 CrCl = 64 ml/min Ke = 0.058 hr-1 T1/2 = 12 hrs Vd = 58.8 L   Expected Cmin = 11.2 mcg/dL    Temp (24hrs), Avg:93.6 F (34.2 C), Min:93.6 F (34.2 C), Max:93.6 F (34.2 C)  Recent Labs  Lab 01/09/18 1407 01/10/18 1930 01/10/18 1934  WBC 3.1* 11.9*  --   CREATININE 1.47* 1.29*  --   LATICACIDVEN  --   --  4.26*    Estimated Creatinine Clearance: 64 mL/min (A) (by C-G formula based on SCr of 1.29 mg/dL (H)).    No Known Allergies  Antimicrobials this admission:   >>    >>   Dose adjustments this admission:   Microbiology results:  BCx:   UCx:    Sputum:    MRSA PCR:   Thank you for allowing pharmacy to be a part of this patient's care.  Natania Finigan D 01/10/2018 9:29 PM

## 2018-01-10 NOTE — Progress Notes (Signed)
CODE SEPSIS - PHARMACY COMMUNICATION  **Broad Spectrum Antibiotics should be administered within 1 hour of Sepsis diagnosis**  Time Code Sepsis Called/Page Received: 1925  Antibiotics Ordered: vancomycin, Flagyl, cefepime  Time of 1st antibiotic administration: 1947 - cefepime  Additional action taken by pharmacy: vanc dose changed to 1750 mg x1 (21 mg/kg)  If necessary, Name of Provider/Nurse Contacted: n/a    Rodney Stewart ,PharmD Clinical Pharmacist  01/10/2018  7:24 PM

## 2018-01-10 NOTE — ED Notes (Signed)
Date and time results received: 01/10/18  (use smartphrase ".now" to insert current time)  Test: lactic Critical Value: 4.26  Name of Provider Notified: Dr. Joni Fears  Orders Received? Or Actions Taken?: acknowledged

## 2018-01-10 NOTE — ED Provider Notes (Signed)
[facility] Emergency Department Provider Note  ____________________________________________  Time seen: Approximately 8:24 PM  I have reviewed the triage vital signs and the nursing notes.   HISTORY  Chief Complaint Chills and Nausea  Level 5 Caveat: Portions of the History and Physical including HPI and review of systems are unable to be completely obtained due to patient being a poor historian    HPI Rodney Stewart is a 63 y.o. male with a history of cirrhosis, CKD, hypertension who complains of weakness, body aches, nausea and chills.  He states "I need some heat."      Past Medical History:  Diagnosis Date  . Anemia   . Ascites   . BPH (benign prostatic hyperplasia)   . Cirrhosis ([place])   . CKD (chronic kidney disease) stage 3, GFR 30-59 ml/min (HCC)   . Clear cell renal cell carcinoma ([place]) 2014   Left Nephrectomy.  . Colon polyps   . Diabetes mellitus without complication ([place])    type 2   . Dyspnea    with exertion  . GERD (gastroesophageal reflux disease)   . History of gout   . History of nephrectomy    Left  . Hypertension   . Neuropathy   . Renal Cancer    Renal Cancer  . Renal cell carcinoma of left kidney (HCC)    mets to lungs  . Umbilical hernia      Patient Active Problem List   Diagnosis Date Noted  . Constipation 12/26/2017  . Abnormal gait 12/26/2017  . Type 2 diabetes mellitus with diabetic polyneuropathy, with long-term current use of insulin ([place]) 11/06/2017  . Hypoalbuminemia 10/18/2017  . Hypothyroidism 10/03/2017  . Bilateral leg edema 10/03/2017  . Hydrocele 10/03/2017  . Protein-calorie malnutrition, severe 09/15/2017  . Ascites   . Hyponatremia 05/28/2017  . Colon polyps 04/26/2017  . Umbilical hernia 35/00/9381  . Left inguinal hernia 04/26/2017  . Gout 04/26/2017  . Fatty liver 04/26/2017  . Leukopenia 04/26/2017  . Gallstones 04/26/2017  . Type 2 diabetes mellitus with diabetic neuropathy,  unspecified ([place]) 04/26/2017  . CAD (coronary artery disease) 04/26/2017  . Chronic knee pain 04/26/2017  . Protein-calorie malnutrition ([place]) 04/26/2017  . Cirrhosis ([place]) 02/17/2017  . Neutropenia associated with infection ([place])   . Malignant neoplasm of kidney ([place])   . Thrombocytopenia ([place])   . Iron deficiency anemia 01/23/2017  . Goals of care, counseling/discussion 11/27/2016  . BPH (benign prostatic hyperplasia) 10/27/2016  . Effusion of left olecranon bursa 10/27/2016  . Foreign body in left ear 10/27/2016  . Pancytopenia ([place]) 10/27/2016  . Alcohol abuse 08/28/2016  . Nonimmune to hepatitis B virus 08/02/2016  . Metastatic renal cell carcinoma to lung ([place])   . ARF (acute renal failure) ([place]) 09/30/2015  . Type 2 diabetes mellitus with moderate nonproliferative diabetic retinopathy and without macular edema ([place]) 09/17/2014  . Cataract 08/20/2014  . Hypertension 07/02/2014  . CKD (chronic kidney disease) stage 3, GFR 30-59 ml/min (HCC) 11/30/2013  . Single kidney 02/04/2013  . Renal cell carcinoma ([place]) 01/02/2012     Past Surgical History:  Procedure Laterality Date  . BACK SURGERY     ruptured disc 1991   . ENDOBRONCHIAL ULTRASOUND N/A 10/14/2015   Procedure: ENDOBRONCHIAL ULTRASOUND;  Surgeon: [name], MD;  Location: [facility] ORS;  Service: Pulmonary;  Laterality: N/A;  . ESOPHAGOGASTRODUODENOSCOPY (EGD) WITH PROPOFOL N/A 04/15/2017   Procedure: ESOPHAGOGASTRODUODENOSCOPY (EGD) WITH PROPOFOL;  Surgeon: Jonathon Bellows, MD;  Location: [facility] ENDOSCOPY;  Service: Gastroenterology;  Laterality: N/A;  Screen for esophageal varices  . EYE SURGERY Bilateral    Cataract Extraction with IOL  . IR RADIOLOGIST EVAL & MGMT  10/15/2017  . KNEE SURGERY Right   . NEPHRECTOMY     left kidney 2014 renal cell cancer      Prior to Admission medications   Medication Sig Start Date End Date Taking? Authorizing Provider  allopurinol (ZYLOPRIM) 100 MG tablet Take 1 tablet (100  mg total) by mouth daily. 08/27/17   [name], [name], MD  ALPRAZolam Duanne Moron) 1 MG tablet Take 1 mg by mouth 2 (two) times daily as needed for anxiety or sleep.  06/10/16   [provider]  aspirin EC 81 MG tablet Take 81 mg by mouth daily.    [provider]  docusate sodium (COLACE) 100 MG capsule Take 1 capsule (100 mg total) by mouth 2 (two) times daily as needed for mild constipation. 12/26/17   [name], [name], MD  feeding supplement, ENSURE ENLIVE, (ENSURE ENLIVE) LIQD Take 237 mLs by mouth 3 (three) times daily between meals. 09/18/17   Bettey Costa, MD  furosemide (LASIX) 20 MG tablet Take 1-2 tablets (20-40 mg total) by mouth daily. In am as needed leg edema 11/13/17   [name], [name], MD  gabapentin (NEURONTIN) 100 MG capsule Take 2 capsules (200 mg total) by mouth 3 (three) times daily. Patient taking differently: Take 200 mg by mouth 2 (two) times daily.  04/26/17   [name], [name], MD  insulin aspart (NOVOLOG) 100 UNIT/ML FlexPen Before meals 131-180 2 units, 181-240 4 units, 241-300 6 units, 301-350 8 units, 351-400 10 units, >400 12 units 09/30/17   [name], [name], MD  Insulin Detemir (LEVEMIR FLEXTOUCH) 100 UNIT/ML Pen Inject 18 Units into the skin 2 (two) times daily. With food 11/05/17   [name], [name], MD  Insulin Pen Needle 32G X 4 MM MISC 1 Device by Does not apply route 2 (two) times daily. E11.9 09/23/17   [name], [name], MD  lactulose (CHRONULAC) 10 GM/15ML solution Take 45 mLs (30 g total) by mouth daily. 12/26/17   [name], [name], MD  levothyroxine (SYNTHROID, LEVOTHROID) 75 MCG tablet Take 1 tablet (75 mcg total) by mouth daily before breakfast. 30 minutes 12/26/17   [name], [name], MD  lovastatin (MEVACOR) 20 MG tablet Take 1 tablet (20 mg total) by mouth daily at 6 PM. 08/08/17   [name], [name], MD  magic mouthwash w/lidocaine SOLN Take 5 mLs by mouth 3 (three) times  daily as needed for mouth pain. Swish and spit Patient not taking: Reported on 12/23/2017 10/30/17   [name], [name], MD  mupirocin ointment (BACTROBAN) 2 % Apply 1 application topically 3 (three) times daily. Left big toe and right back Patient not taking: Reported on 12/23/2017 11/05/17   [name], [name], MD  ONE TOUCH ULTRA TEST test strip Bid 05/28/17   [name], [name], MD  oxyCODONE (ROXICODONE) 5 MG immediate release tablet Take 1 tablet (5 mg total) by mouth every 6 (six) hours as needed for severe pain or breakthrough pain. Patient not taking: Reported on 12/23/2017 09/25/17   Jacquelin Hawking, NP  polyethylene glycol [facility]) packet Take 17 g by mouth daily. 12/12/17   Schuyler Amor, MD  potassium chloride (K-DUR) 10 MEQ tablet Take 2 tablets (20 mEq total) by mouth daily. 12/12/17   Schuyler Amor, MD  potassium chloride SA (K-DUR,KLOR-CON) 20 MEQ tablet Take 1  tablet (20 mEq total) by mouth daily for 3 days. 10/22/17 12/19/17  Dustin Flock, MD  rifaximin (XIFAXAN) 550 MG TABS tablet Take 1 tablet (550 mg total) by mouth 2 (two) times daily at 8 am and 10 pm. 12/27/17 03/27/18  Jonathon Bellows, MD  tamsulosin (FLOMAX) 0.4 MG CAPS capsule Take 1 capsule (0.4 mg total) by mouth every other day. With supper 10/07/17   [name], [name], MD     Allergies Patient has no known allergies.   Family History  Problem Relation Age of Onset  . Ovarian cancer Mother   . Diabetes Father   . Hypertension Father     Social History Social History   Tobacco Use  . Smoking status: Former Smoker    Packs/day: 1.00    Types: Cigarettes    Last attempt to quit: 04/15/1993    Years since quitting: 24.7  . Smokeless tobacco: Never Used  Substance Use Topics  . Alcohol use: Not Currently  . Drug use: No    Review of Systems  Constitutional:   No fever positive chills.  ENT:   No sore throat. No rhinorrhea. Cardiovascular:   No chest pain or  syncope. Respiratory:   No dyspnea positive cough. Gastrointestinal:   Negative for abdominal pain, vomiting and diarrhea.  Musculoskeletal:   Negative for focal pain or swelling All other systems reviewed and are negative except as documented above in ROS and HPI.  ____________________________________________   PHYSICAL EXAM:  VITAL SIGNS: ED Triage Vitals  Enc Vitals Group     BP 01/10/18 1930 (!) 162/100     Pulse Rate 01/10/18 1930 (!) 116     Resp 01/10/18 1937 18     Temp 01/10/18 1937 (!) 93.6 F (34.2 C)     Temp Source 01/10/18 1937 Rectal     SpO2 01/10/18 1930 93 %     Weight --      Height --      Head Circumference --      Peak Flow --      Pain Score 01/10/18 1938 0     Pain Loc --      Pain Edu? --      Excl. in [place]? --     Vital signs reviewed, nursing assessments reviewed.   Constitutional:   Alert and oriented.  Ill-appearing. Eyes:   Conjunctivae are normal. EOMI. PERRL. ENT      Head:   Normocephalic and atraumatic.      Nose:   No congestion/rhinnorhea.       Mouth/Throat:   Dry mucous membranes, no pharyngeal erythema. No peritonsillar mass.       Neck:   No meningismus. Full ROM. Hematological/Lymphatic/Immunilogical:   No cervical lymphadenopathy. Cardiovascular:   Tachycardia heart rate 115. Symmetric bilateral radial and DP pulses.  No murmurs. Cap refill less than 2 seconds. Respiratory:   Normal respiratory effort without tachypnea/retractions. Breath sounds are clear and equal bilaterally. No wheezes/rales/rhonchi. Gastrointestinal:   Soft and nontender.  Severely distended, not tympanitic there is no CVA tenderness.  No rebound, rigidity, or guarding.  Rectal exam shows brown stool, Hemoccult negative  Musculoskeletal:   Normal range of motion in all extremities. No joint effusions.  No lower extremity tenderness.  No edema. Neurologic:   Normal speech and language.  Motor grossly intact. No acute focal neurologic deficits are  appreciated.  Skin:    Skin is warm, dry and intact. No rash noted.  No petechiae, purpura, or bullae.  ____________________________________________    LABS (pertinent positives/negatives) (all labs ordered are listed, but only abnormal results are displayed) Labs Reviewed  COMPREHENSIVE METABOLIC PANEL - Abnormal; Notable for the following components:      Result Value   BUN 26 (*)    Creatinine, Ser 1.29 (*)    Calcium 8.2 (*)    Albumin 2.3 (*)    AST 46 (*)    Alkaline Phosphatase 403 (*)    Total Bilirubin 2.6 (*)    GFR calc non Af Amer 59 (*)    All other components within normal limits  APTT - Abnormal; Notable for the following components:   aPTT 39 (*)    All other components within normal limits  PROTIME-INR - Abnormal; Notable for the following components:   Prothrombin Time 15.6 (*)    All other components within normal limits  CG4 I-STAT (LACTIC ACID) - Abnormal; Notable for the following components:   Lactic Acid, Venous 4.26 (*)    All other components within normal limits  CULTURE, BLOOD (ROUTINE X 2)  CULTURE, BLOOD (ROUTINE X 2)  URINE CULTURE  LIPASE, BLOOD  TROPONIN I  CBC WITH DIFFERENTIAL/PLATELET  PROCALCITONIN  URINALYSIS, COMPLETE (UACMP) WITH MICROSCOPIC  INFLUENZA PANEL BY PCR (TYPE A & B)  AMMONIA  I-STAT CG4 LACTIC ACID, ED  I-STAT CG4 LACTIC ACID, ED   ____________________________________________   EKG  Interpreted by me Sinus tachycardia rate 113, normal axis and intervals.  Normal QRS ST segments and T waves  ____________________________________________    RADIOLOGY  Dg Chest Port 1 View  Result Date: 01/10/2018 CLINICAL DATA:  Chills and nausea. Previous history of renal cell carcinoma metastatic to lung. EXAM: PORTABLE CHEST 1 VIEW COMPARISON:  12/12/2017 FINDINGS: Shallow inspiration. Heart size and pulmonary vascularity are normal for technique. Suggestion of infiltration or atelectasis in the left lung base. Probable small  left pleural effusion as seen previously. Could consider pneumonia in the appropriate clinical setting. No pneumothorax. Calcification of the aorta. Degenerative changes in the shoulders. IMPRESSION: Infiltration or atelectasis in the left lung base with small left pleural effusion. Consider pneumonia in the appropriate clinical setting. Electronically Signed   By: Lucienne Capers M.D.   On: 01/10/2018 20:00    ____________________________________________   PROCEDURES .Critical Care Performed by: Carrie Mew, MD Authorized by: Carrie Mew, MD   Critical care provider statement:    Critical care time (minutes):  35   Critical care time was exclusive of:  Separately billable procedures and treating other patients   Critical care was necessary to treat or prevent imminent or life-threatening deterioration of the following conditions:  Sepsis and shock   Critical care was time spent personally by me on the following activities:  Development of treatment plan with patient or surrogate, discussions with consultants, evaluation of patient's response to treatment, examination of patient, obtaining history from patient or surrogate, ordering and performing treatments and interventions, ordering and review of laboratory studies, ordering and review of radiographic studies, pulse oximetry, re-evaluation of patient's condition and review of old charts    ____________________________________________  DIFFERENTIAL DIAGNOSIS   Hepatic encephalopathy, influenza, pneumonia, urinary tract infection, doubt SBP.  CLINICAL IMPRESSION / ASSESSMENT AND PLAN / ED COURSE  Pertinent labs & imaging results that were available during my care of the patient were reviewed by me and considered in my medical decision making (see chart for details).    Patient presents with hypothermia tachycardia.  Sepsis protocol initiated on arrival.  Also check ammonia.  Patient will need to be admitted pending  work-up.  Empiric antibiotics  Clinical Course as of Jan 10 2022  Fri Jan 10, 2018  1936 Lactate >4. Continue sepsis resuscitation.    [PS]    Clinical Course User Index [PS] Carrie Mew, MD    ----------------------------------------- 8:26 PM on 01/10/2018 -----------------------------------------  Chest x-ray suggested a left lower lobe pneumonia.  Plan to admit for further management.  Pneumonia still pending.   ____________________________________________   FINAL CLINICAL IMPRESSION(S) / ED DIAGNOSES    Final diagnoses:  Pneumonia of left lower lobe due to infectious organism [facility])  Septic shock [facility])     ED Discharge Orders    None      Portions of this note were generated with dragon dictation software. Dictation errors may occur despite best attempts at proofreading.   Carrie Mew, MD 01/10/18 2027

## 2018-01-10 NOTE — Progress Notes (Signed)
Advanced care plan.  Purpose of the Encounter: CODE STATUS  Parties in Attendance: Patient and family  Patient's Decision Capacity: Good  Subjective/Patient's story: Presented to the emergency room for weakness and low body temperature   Objective/Medical story Has hypothermia and elevated lactic acid Appears septic needs IV fluids and antibiotics and GI evaluation Has history of cirrhosis of liver   Goals of care determination:  Advance care directives goals of care and treatment plan discussed Patient does not want CPR, intubation ventilator the need arises   CODE STATUS: DNR   Time spent discussing advanced care planning: 16 minutes

## 2018-01-10 NOTE — H&P (Signed)
[facility] [place] NAME: Rodney Stewart    MR#:  [id number]  DATE OF BIRTH:  [birth year]  DATE OF ADMISSION:  01/10/2018  PRIMARY CARE PHYSICIAN: [name], [name], MD   REQUESTING/REFERRING PHYSICIAN:   CHIEF COMPLAINT:   Chief Complaint  Patient presents with  . Chills  . Nausea    HISTORY OF PRESENT ILLNESS: Rodney Stewart  is a 63 y.o. male with a known history of ascites, cirrhosis of liver, prostate hypertrophy, CKD stage III, type 2 diabetes mellitus, GERD, hypertension, renal cancer of left kidney presented to the emergency room for chills, nausea.  His body temperature was low around 93 F.  Patient was put on warming blanket.  Evaluation in the emergency room revealed elevated lactic acid code sepsis was called.  Patient was given IV fluids based on sepsis protocol and started on broad-spectrum antibiotics.  Not been taking his lactulose regularly.  Follows up with gastroenterology as outpatient in the clinic.  Gets periodic paracentesis done.  PAST MEDICAL HISTORY:   Past Medical History:  Diagnosis Date  . Anemia   . Ascites   . BPH (benign prostatic hyperplasia)   . Cirrhosis ([place])   . CKD (chronic kidney disease) stage 3, GFR 30-59 ml/min (HCC)   . Clear cell renal cell carcinoma ([place]) 2014   Left Nephrectomy.  . Colon polyps   . Diabetes mellitus without complication ([place])    type 2   . Dyspnea    with exertion  . GERD (gastroesophageal reflux disease)   . History of gout   . History of nephrectomy    Left  . Hypertension   . Neuropathy   . Renal Cancer    Renal Cancer  . Renal cell carcinoma of left kidney (HCC)    mets to lungs  . Umbilical hernia     PAST SURGICAL HISTORY:  Past Surgical History:  Procedure Laterality Date  . BACK SURGERY     ruptured disc 1991   . ENDOBRONCHIAL ULTRASOUND N/A 10/14/2015   Procedure: ENDOBRONCHIAL ULTRASOUND;  Surgeon: [name], MD;  Location: [facility]  ORS;  Service: Pulmonary;  Laterality: N/A;  . ESOPHAGOGASTRODUODENOSCOPY (EGD) WITH PROPOFOL N/A 04/15/2017   Procedure: ESOPHAGOGASTRODUODENOSCOPY (EGD) WITH PROPOFOL;  Surgeon: Jonathon Bellows, MD;  Location[facility] ENDOSCOPY;  Service: Gastroenterology;  Laterality: N/A;  Screen for esophageal varices  . EYE SURGERY Bilateral    Cataract Extraction with IOL  . IR RADIOLOGIST EVAL & MGMT  10/15/2017  . KNEE SURGERY Right   . NEPHRECTOMY     left kidney 2014 renal cell cancer     SOCIAL HISTORY:  Social History   Tobacco Use  . Smoking status: Former Smoker    Packs/day: 1.00    Types: Cigarettes    Last attempt to quit: 04/15/1993    Years since quitting: 24.7  . Smokeless tobacco: Never Used  Substance Use Topics  . Alcohol use: Not Currently    FAMILY HISTORY:  Family History  Problem Relation Age of Onset  . Ovarian cancer Mother   . Diabetes Father   . Hypertension Father     DRUG ALLERGIES: No Known Allergies  REVIEW OF SYSTEMS:   CONSTITUTIONAL: No fever, has fatigue and weakness.  EYES: No blurred or double vision.  EARS, NOSE, AND THROAT: No tinnitus or ear pain.  RESPIRATORY: No cough, shortness of breath, wheezing or hemoptysis.  CARDIOVASCULAR: No chest pain, orthopnea, edema.  GASTROINTESTINAL: Has nausea, vomiting,  No diarrhea or abdominal pain.  GENITOURINARY: No dysuria, hematuria.  ENDOCRINE: No polyuria, nocturia,  HEMATOLOGY: No anemia, easy bruising or bleeding SKIN: No rash or lesion. MUSCULOSKELETAL: No joint pain or arthritis.   NEUROLOGIC: No tingling, numbness, weakness.  PSYCHIATRY: No anxiety or depression.   MEDICATIONS AT HOME:  Prior to Admission medications   Medication Sig Start Date End Date Taking? Authorizing Provider  allopurinol (ZYLOPRIM) 100 MG tablet Take 1 tablet (100 mg total) by mouth daily. 08/27/17  Yes [name], [name], MD  ALPRAZolam Duanne Moron) 1 MG tablet Take 1 mg by mouth 2 (two) times daily as needed for anxiety  or sleep.  06/10/16  Yes [provider]  aspirin EC 81 MG tablet Take 81 mg by mouth daily.   Yes [provider]  docusate sodium (COLACE) 100 MG capsule Take 1 capsule (100 mg total) by mouth 2 (two) times daily as needed for mild constipation. 12/26/17  Yes [name], [name], MD  furosemide (LASIX) 20 MG tablet Take 1-2 tablets (20-40 mg total) by mouth daily. In am as needed leg edema 11/13/17  Yes [name], [name], MD  gabapentin (NEURONTIN) 100 MG capsule Take 2 capsules (200 mg total) by mouth 3 (three) times daily. Patient taking differently: Take 200 mg by mouth 2 (two) times daily.  04/26/17  Yes [name], [name], MD  insulin aspart (NOVOLOG) 100 UNIT/ML FlexPen Before meals 131-180 2 units, 181-240 4 units, 241-300 6 units, 301-350 8 units, 351-400 10 units, >400 12 units 09/30/17  Yes [name], [name], MD  Insulin Detemir (LEVEMIR FLEXTOUCH) 100 UNIT/ML Pen Inject 18 Units into the skin 2 (two) times daily. With food 11/05/17  Yes [name], [name], MD  levothyroxine (SYNTHROID, LEVOTHROID) 75 MCG tablet Take 1 tablet (75 mcg total) by mouth daily before breakfast. 30 minutes 12/26/17  Yes [name], [name], MD  lovastatin (MEVACOR) 20 MG tablet Take 1 tablet (20 mg total) by mouth daily at 6 PM. 08/08/17  Yes [name], [name], MD  potassium chloride (K-DUR) 10 MEQ tablet Take 2 tablets (20 mEq total) by mouth daily. 12/12/17  Yes Schuyler Amor, MD  rifaximin (XIFAXAN) 550 MG TABS tablet Take 1 tablet (550 mg total) by mouth 2 (two) times daily at 8 am and 10 pm. 12/27/17 03/27/18 Yes Jonathon Bellows, MD  tamsulosin (FLOMAX) 0.4 MG CAPS capsule Take 1 capsule (0.4 mg total) by mouth every other day. With supper 10/07/17  Yes [name], [name], MD  feeding supplement, ENSURE ENLIVE, (ENSURE ENLIVE) LIQD Take 237 mLs by mouth 3 (three) times daily between meals. 09/18/17   Bettey Costa, MD  Insulin Pen Needle 32G X 4 MM MISC  1 Device by Does not apply route 2 (two) times daily. E11.9 09/23/17   [name], [name], MD  lactulose (CHRONULAC) 10 GM/15ML solution Take 45 mLs (30 g total) by mouth daily. 12/26/17   [name], [name], MD  magic mouthwash w/lidocaine SOLN Take 5 mLs by mouth 3 (three) times daily as needed for mouth pain. Swish and spit Patient not taking: Reported on 12/23/2017 10/30/17   [name], [name], MD  mupirocin ointment (BACTROBAN) 2 % Apply 1 application topically 3 (three) times daily. Left big toe and right back Patient not taking: Reported on 12/23/2017 11/05/17   [name], [name], MD  ONE TOUCH ULTRA TEST test strip Bid 05/28/17   [name], [name], MD  oxyCODONE (ROXICODONE) 5 MG immediate release tablet Take 1 tablet (5 mg  total) by mouth every 6 (six) hours as needed for severe pain or breakthrough pain. Patient not taking: Reported on 12/23/2017 09/25/17   Jacquelin Hawking, NP  polyethylene glycol [facility]) packet Take 17 g by mouth daily. 12/12/17   Schuyler Amor, MD  potassium chloride SA (K-DUR,KLOR-CON) 20 MEQ tablet Take 1 tablet (20 mEq total) by mouth daily for 3 days. 10/22/17 12/19/17  Dustin Flock, MD      PHYSICAL EXAMINATION:   VITAL SIGNS: Blood pressure (!) 148/93, pulse (!) 110, temperature (!) 93.6 F (34.2 C), temperature source Rectal, resp. rate (!) 24, SpO2 97 %.  GENERAL:  63 y.o.-year-old patient lying in the bed on warming blanket EYES: Pupils equal, round, reactive to light and accommodation.  Mild icterus icterus. Extraocular muscles intact.  HEENT: Head atraumatic, normocephalic. Oropharynx and nasopharynx clear.  NECK:  Supple, no jugular venous distention. No thyroid enlargement, no tenderness.  LUNGS: Normal breath sounds bilaterally, no wheezing, rales,rhonchi or crepitation. No use of accessory muscles of respiration.  CARDIOVASCULAR: S1, S2 tachycardia present. No murmurs, rubs, or gallops.  ABDOMEN: Soft,  nontender, distended. Bowel sounds present. No organomegaly or mass.  Ascites noted EXTREMITIES: No pedal edema, cyanosis, or clubbing.  NEUROLOGIC: Cranial nerves II through XII are intact. Muscle strength 5/5 in all extremities. Sensation intact. Gait not checked.  PSYCHIATRIC: The patient is alert and oriented x 3.  SKIN: No obvious rash, lesion, or ulcer.   LABORATORY PANEL:   CBC Recent Labs  Lab 01/09/18 1407 01/10/18 1930  WBC 3.1* 11.9*  HGB 7.0* 10.4*  HCT 23.4* 35.0*  PLT 129* 258  MCV 79.3* 80.3  MCH 23.7* 23.9*  MCHC 29.9* 29.7*  RDW 15.6* 15.8*  LYMPHSABS 0.3* 0.4*  MONOABS 0.3 0.4  EOSABS 0.1 0.0  BASOSABS 0.0 0.0   ------------------------------------------------------------------------------------------------------------------  Chemistries  Recent Labs  Lab 01/09/18 1407 01/10/18 1930  NA 134* 136  K 3.5 3.7  CL 100 98  CO2 28 25  GLUCOSE 156* 85  BUN 27* 26*  CREATININE 1.47* 1.29*  CALCIUM 7.8* 8.2*  AST 37 46*  ALT 20 21  ALKPHOS 358* 403*  BILITOT 1.9* 2.6*   ------------------------------------------------------------------------------------------------------------------ estimated creatinine clearance is 64 mL/min (A) (by C-G formula based on SCr of 1.29 mg/dL (H)). ------------------------------------------------------------------------------------------------------------------ Recent Labs    01/09/18 1407  TSH 4.410  T4TOTAL 7.7     Coagulation profile Recent Labs  Lab 01/10/18 1930  INR 1.25   ------------------------------------------------------------------------------------------------------------------- No results for input(s): DDIMER in the last 72 hours. -------------------------------------------------------------------------------------------------------------------  Cardiac Enzymes Recent Labs  Lab 01/10/18 1930  TROPONINI <0.03    ------------------------------------------------------------------------------------------------------------------ Invalid input(s): POCBNP  ---------------------------------------------------------------------------------------------------------------  Urinalysis    Component Value Date/Time   COLORURINE DARK YELLOW 11/05/2017 1505   APPEARANCEUR CLEAR 11/05/2017 1505   APPEARANCEUR CLOUDY 04/26/2012 1620   LABSPEC 1.012 11/05/2017 1505   LABSPEC 1.017 04/26/2012 1620   PHURINE < OR = 5.0 11/05/2017 1505   GLUCOSEU TRACE (A) 11/05/2017 1505   GLUCOSEU see comment 04/26/2012 1620   HGBUR NEGATIVE 11/05/2017 1505   BILIRUBINUR NEGATIVE 10/18/2017 1003   BILIRUBINUR see comment 04/26/2012 1620   KETONESUR NEGATIVE 11/05/2017 1505   PROTEINUR NEGATIVE 11/05/2017 1505   NITRITE NEGATIVE 11/05/2017 1505   LEUKOCYTESUR NEGATIVE 11/05/2017 1505   LEUKOCYTESUR see comment 04/26/2012 1620     RADIOLOGY: Dg Chest Port 1 View  Result Date: 01/10/2018 CLINICAL DATA:  Chills and nausea. Previous history of renal cell carcinoma metastatic to lung. EXAM: PORTABLE CHEST  1 VIEW COMPARISON:  12/12/2017 FINDINGS: Shallow inspiration. Heart size and pulmonary vascularity are normal for technique. Suggestion of infiltration or atelectasis in the left lung base. Probable small left pleural effusion as seen previously. Could consider pneumonia in the appropriate clinical setting. No pneumothorax. Calcification of the aorta. Degenerative changes in the shoulders. IMPRESSION: Infiltration or atelectasis in the left lung base with small left pleural effusion. Consider pneumonia in the appropriate clinical setting. Electronically Signed   By: Lucienne Capers M.D.   On: 01/10/2018 20:00    EKG: Orders placed or performed during the hospital encounter of 01/10/18  . EKG 12-Lead  . EKG 12-Lead    IMPRESSION AND PLAN: 63 year old male patient with a known history of ascites, cirrhosis of liver,  prostate hypertrophy, CKD stage III, type 2 diabetes mellitus, GERD, hypertension, renal cancer of left kidney presented to the emergency room for chills, nausea.   -Sepsis Admit patient to medical floor IV fluid hydration Follow-up cultures Start patient on IV vancomycin and cefepime antibiotics Follow-up lactic acid level  -Hypothermia probably secondary sepsis Warming blanket  -Decompensated liver disease Gastroenterology consultation Ultrasound-guided paracentesis once hypothermia resolved Will have to be evaluated for spontaneous bacterial peritonitis  -Cirrhosis of liver Continue Xifaxan Gastroenterology follow-up  -Type 2 diabetes mellitus Diet controlled with sliding scale coverage with insulin  -Renal cancer Supportive care  -Long-term prognosis poor  -CKD stage III Monitor renal function All the records are reviewed and case discussed with ED provider. Management plans discussed with the patient, family and they are in agreement.  CODE STATUS:DNR Code Status History    Date Active Date Inactive Code Status Order ID Comments User Context   10/19/2017 0011 10/22/2017 2047 DNR [id number]  Lance Coon, MD Inpatient   09/16/2017 1346 09/18/2017 1544 DNR [id number]  Jimmy Footman, NP Inpatient   09/14/2017 0254 09/16/2017 1346 Full Code [id number]  Amelia Jo, MD Inpatient   01/25/2017 1037 01/27/2017 1704 Full Code [id number]  Bettey Costa, MD ED   09/30/2015 1609 10/03/2015 1530 Full Code [id number]  Demetrios Loll, MD Inpatient    Questions for Most Recent Historical Code Status (Order [id number])    Question Answer Comment   In the event of cardiac or respiratory ARREST Do not call a "code blue"    In the event of cardiac or respiratory ARREST Do not perform Intubation, CPR, defibrillation or ACLS    In the event of cardiac or respiratory ARREST Use medication by any route, position, wound care, and other measures to relive pain and suffering. May use oxygen,  suction and manual treatment of airway obstruction as needed for comfort.        TOTAL TIME TAKING CARE OF THIS PATIENT: 54 minutes.    Saundra Shelling M.D on 01/10/2018 at 8:57 PM  Between 7am to 6pm - Pager - [phone]  After 6pm go to [email or web address] - password [email or web address] [facility]  [place] [facility]  Office  [phone]  CC: Primary care physician; [name], [name], MD

## 2018-01-11 ENCOUNTER — Inpatient Hospital Stay: Payer: Medicare HMO

## 2018-01-11 DIAGNOSIS — K7031 Alcoholic cirrhosis of liver with ascites: Secondary | ICD-10-CM

## 2018-01-11 DIAGNOSIS — C642 Malignant neoplasm of left kidney, except renal pelvis: Secondary | ICD-10-CM

## 2018-01-11 DIAGNOSIS — R0603 Acute respiratory distress: Secondary | ICD-10-CM

## 2018-01-11 DIAGNOSIS — J181 Lobar pneumonia, unspecified organism: Secondary | ICD-10-CM

## 2018-01-11 LAB — SAMPLE TO BLOOD BANK

## 2018-01-11 LAB — BASIC METABOLIC PANEL
Anion gap: 9 (ref 5–15)
BUN: 28 mg/dL — ABNORMAL HIGH (ref 8–23)
CO2: 23 mmol/L (ref 22–32)
Calcium: 7.6 mg/dL — ABNORMAL LOW (ref 8.9–10.3)
Chloride: 104 mmol/L (ref 98–111)
Creatinine, Ser: 1.17 mg/dL (ref 0.61–1.24)
GFR calc Af Amer: >60 mL/min (ref >60)
GFR calc non Af Amer: >60 mL/min (ref >60)
Glucose, Bld: 83 mg/dL (ref 70–99)
Potassium: 3.4 mmol/L — ABNORMAL LOW (ref 3.5–5.1)
Sodium: 136 mmol/L (ref 135–145)

## 2018-01-11 LAB — BLOOD GAS, ARTERIAL
Acid-base deficit: 3.4 mmol/L — ABNORMAL HIGH (ref 0.0–2.0)
Bicarbonate: 21.4 mmol/L (ref 20.0–28.0)
DELIVERY SYSTEMS: POSITIVE
Expiratory PAP: 6
FIO2: 0.4
INSPIRATORY PAP: 12
LHR: 12 {breaths}/min
O2 Saturation: 97.6 %
Patient temperature: 37
pCO2 arterial: 37 mmHg (ref 32.0–48.0)
pH, Arterial: 7.37 (ref 7.350–7.450)
pO2, Arterial: 100 mmHg (ref 83.0–108.0)

## 2018-01-11 LAB — CBC
HCT: 28.4 % — ABNORMAL LOW (ref 39.0–52.0)
Hemoglobin: 8.7 g/dL — ABNORMAL LOW (ref 13.0–17.0)
MCH: 24.5 pg — ABNORMAL LOW (ref 26.0–34.0)
MCHC: 30.6 g/dL (ref 30.0–36.0)
MCV: 80 fL (ref 80.0–100.0)
PLATELETS: 162 10*3/uL (ref 150–400)
RBC: 3.55 MIL/uL — ABNORMAL LOW (ref 4.22–5.81)
RDW: 15.6 % — ABNORMAL HIGH (ref 11.5–15.5)
WBC: 8.9 10*3/uL (ref 4.0–10.5)
nRBC: 0 % (ref 0.0–0.2)

## 2018-01-11 LAB — MAGNESIUM: Magnesium: 1.4 mg/dL — ABNORMAL LOW (ref 1.7–2.4)

## 2018-01-11 LAB — LACTIC ACID, PLASMA
Lactic Acid, Venous: 1.7 mmol/L (ref 0.5–1.9)
Lactic Acid, Venous: 2.2 mmol/L (ref 0.5–1.9)

## 2018-01-11 LAB — PROTIME-INR
INR: 1.47
Prothrombin Time: 17.7 seconds — ABNORMAL HIGH (ref 11.4–15.2)

## 2018-01-11 LAB — MRSA PCR SCREENING: MRSA by PCR: NEGATIVE

## 2018-01-11 LAB — AMMONIA: Ammonia: 46 umol/L — ABNORMAL HIGH (ref 9–35)

## 2018-01-11 LAB — PHOSPHORUS: Phosphorus: 4.3 mg/dL (ref 2.5–4.6)

## 2018-01-11 MED ORDER — POTASSIUM CHLORIDE CRYS ER 20 MEQ PO TBCR
60.0000 meq | EXTENDED_RELEASE_TABLET | Freq: Once | ORAL | Status: AC
  Administered 2018-01-11: 60 meq via ORAL
  Filled 2018-01-11: qty 3

## 2018-01-11 MED ORDER — PROMETHAZINE HCL 25 MG/ML IJ SOLN
12.5000 mg | Freq: Four times a day (QID) | INTRAMUSCULAR | Status: DC | PRN
  Administered 2018-01-11 – 2018-01-12 (×2): 12.5 mg via INTRAVENOUS
  Filled 2018-01-11 (×2): qty 1

## 2018-01-11 MED ORDER — FENTANYL CITRATE (PF) 100 MCG/2ML IJ SOLN
25.0000 ug | INTRAMUSCULAR | Status: DC | PRN
  Administered 2018-01-11 (×3): 25 ug via INTRAVENOUS
  Filled 2018-01-11 (×4): qty 2

## 2018-01-11 MED ORDER — HYDROMORPHONE HCL 1 MG/ML IJ SOLN
0.5000 mg | INTRAMUSCULAR | Status: DC | PRN
  Administered 2018-01-11 – 2018-01-21 (×23): 0.5 mg via INTRAVENOUS
  Filled 2018-01-11 (×23): qty 1

## 2018-01-11 MED ORDER — FENTANYL CITRATE (PF) 100 MCG/2ML IJ SOLN
25.0000 ug | Freq: Once | INTRAMUSCULAR | Status: AC
  Administered 2018-01-11: 25 ug via INTRAVENOUS
  Filled 2018-01-11: qty 2

## 2018-01-11 MED ORDER — DIPHENHYDRAMINE HCL 25 MG PO CAPS
25.0000 mg | ORAL_CAPSULE | Freq: Every evening | ORAL | Status: DC | PRN
  Administered 2018-01-11 – 2018-01-12 (×3): 25 mg via ORAL
  Filled 2018-01-11 (×3): qty 1

## 2018-01-11 MED ORDER — MAGNESIUM SULFATE 4 GM/100ML IV SOLN
4.0000 g | Freq: Once | INTRAVENOUS | Status: AC
  Administered 2018-01-11: 4 g via INTRAVENOUS
  Filled 2018-01-11: qty 100

## 2018-01-11 MED ORDER — LORAZEPAM 2 MG/ML IJ SOLN
0.2500 mg | Freq: Once | INTRAMUSCULAR | Status: AC
  Administered 2018-01-11: 0.25 mg via INTRAVENOUS
  Filled 2018-01-11: qty 1

## 2018-01-11 MED ORDER — IPRATROPIUM-ALBUTEROL 0.5-2.5 (3) MG/3ML IN SOLN: 3.0000 mL | Freq: Four times a day (QID) | RESPIRATORY_TRACT | Status: DC | PRN

## 2018-01-11 MED ORDER — IPRATROPIUM-ALBUTEROL 0.5-2.5 (3) MG/3ML IN SOLN
RESPIRATORY_TRACT | Status: AC
  Administered 2018-01-11: 3 mL
  Filled 2018-01-11: qty 3

## 2018-01-11 MED ORDER — GABAPENTIN 100 MG PO CAPS
200.0000 mg | ORAL_CAPSULE | Freq: Three times a day (TID) | ORAL | Status: DC
  Administered 2018-01-11 – 2018-01-14 (×8): 200 mg via ORAL
  Filled 2018-01-11 (×8): qty 2

## 2018-01-11 NOTE — Progress Notes (Addendum)
[place] [facility] [place] NAME: Rodney Stewart    MR#:  [id number]  DATE OF BIRTH:  [birth year]  SUBJECTIVE: Admitted because of respiratory distress, was on BiPAP initially, now weaned off,, this morning, complains of stomach pain, distended stomach, usually gets paracentesis every month.  CHIEF COMPLAINT:   Chief Complaint  Patient presents with  . Chills  . Nausea    REVIEW OF SYSTEMS:   ROS CONSTITUTIONAL: No fever, fatigue or weakness.  EYES: No blurred or double vision.  EARS, NOSE, AND THROAT: No tinnitus or ear pain.  RESPIRATORY: No cough, shortness of breath, wheezing or hemoptysis.  CARDIOVASCULAR: No chest pain, orthopnea, edema.  GASTROINTESTINAL: No nausea, vomiting, diarrhea ,abdominal pain GENITOURINARY: No dysuria, hematuria.  ENDOCRINE: No polyuria, nocturia,  HEMATOLOGY: No anemia, easy bruising or bleeding SKIN: No rash or lesion. MUSCULOSKELETAL: No joint pain or arthritis.   NEUROLOGIC: No tingling, numbness, weakness.  PSYCHIATRY: No anxiety or depression.   DRUG ALLERGIES:  No Known Allergies  VITALS:  Blood pressure 113/69, pulse (!) 103, temperature 98.1 F (36.7 C), temperature source Oral, resp. rate 15, height 5\' 11"  (1.803 m), weight 83.8 kg, SpO2 99 %.  PHYSICAL EXAMINATION:  GENERAL:  63 y.o.-year-old patient lying in the bed with no acute distress.  EYES: Pupils equal, round, reactive to light and accommodation. No scleral icterus. Extraocular muscles intact.  HEENT: Head atraumatic, normocephalic. Oropharynx and nasopharynx clear.  NECK:  Supple, no jugular venous distention. No thyroid enlargement, no tenderness.  LUNGS diminished air entry bilaterally cARDIOVASCULAR: S1, S2 normal. No murmurs, rubs, or gallops.  ABDOMEN: Distended, fluid thrill present. EXTREMITIES: No pedal edema, cyanosis, or clubbing.  NEUROLOGIC: Cranial nerves II through XII are intact. Muscle strength 5/5 in all  extremities. Sensation intact. Gait not checked.  PSYCHIATRIC: The patient is alert and oriented x 3.  SKIN: No obvious rash, lesion, or ulcer.    LABORATORY PANEL:   CBC Recent Labs  Lab 01/11/18 0047  WBC 8.9  HGB 8.7*  HCT 28.4*  PLT 162   ------------------------------------------------------------------------------------------------------------------  Chemistries  Recent Labs  Lab 01/10/18 1930 01/11/18 0047 01/11/18 0552  NA 136 136  --   K 3.7 3.4*  --   CL 98 104  --   CO2 25 23  --   GLUCOSE 85 83  --   BUN 26* 28*  --   CREATININE 1.29* 1.17  --   CALCIUM 8.2* 7.6*  --   MG  --   --  1.4*  AST 46*  --   --   ALT 21  --   --   ALKPHOS 403*  --   --   BILITOT 2.6*  --   --    ------------------------------------------------------------------------------------------------------------------  Cardiac Enzymes Recent Labs  Lab 01/10/18 1930  TROPONINI <0.03   ------------------------------------------------------------------------------------------------------------------  RADIOLOGY:  Dg Chest Port 1 View  Result Date: 01/11/2018 CLINICAL DATA:  Acute respiratory failure. EXAM: PORTABLE CHEST 1 VIEW COMPARISON:  One-view chest x-ray 01/10/2017 FINDINGS: Heart size is normal. Aortic atherosclerosis is again seen. Left basilar airspace disease is similar the prior study. Left effusion is suspected. Minimal atelectasis is present at the right base. IMPRESSION: 1. Similar appearance of left basilar airspace disease, indicating pneumonia. 2. Probable small left pleural effusion. Electronically Signed   By: San Morelle M.D.   On: 01/11/2018 08:13   Dg Chest Port 1 View  Result Date: 01/10/2018 CLINICAL DATA:  Chills and nausea. Previous  history of renal cell carcinoma metastatic to lung. EXAM: PORTABLE CHEST 1 VIEW COMPARISON:  12/12/2017 FINDINGS: Shallow inspiration. Heart size and pulmonary vascularity are normal for technique. Suggestion of  infiltration or atelectasis in the left lung base. Probable small left pleural effusion as seen previously. Could consider pneumonia in the appropriate clinical setting. No pneumothorax. Calcification of the aorta. Degenerative changes in the shoulders. IMPRESSION: Infiltration or atelectasis in the left lung base with small left pleural effusion. Consider pneumonia in the appropriate clinical setting. Electronically Signed   By: Lucienne Capers M.D.   On: 01/10/2018 20:00    EKG:   Orders placed or performed during the hospital encounter of 01/10/18  . EKG 12-Lead  . EKG 12-Lead    ASSESSMENT AND PLAN:  [age]-year-old male patient with history of liver cirrhosis, clear-cell renal cancer comes in because of generalized weakness, body aches and found to have hypothermia, admitted for septic shock, pneumonia in left lung, admitted to ICU.  #1 sepsis present on admission secondary to pneumonia: Improving, off the BiPAP, continue nasal cannula as needed, continue empiric antibiotics. 2.  Renal cell CA with lung mets, getting chemotherapy 3.  History of liver cirrhosis, ascites now has abdominal pain, rule out SBP, patient needs sono-guided paracentesis, continue IV antibiotics 4.  Diabetes mellitus type 2; stable. 5.  Liver cirrhosis with auto-anticoagulation, INR 1.47.   All the records are reviewed and case discussed with Care Management/Social Workerr. Management plans discussed with the patient, family and they are in agreement.  CODE STATUS: Full code  TOTAL TIME TAKING CARE OF THIS PATIENT: 38 minutes.   POSSIBLE D/C IN 1-2 DAYS, DEPENDING ON CLINICAL CONDITION.   Epifanio Lesches M.D on 01/11/2018 at 11:13 AM  Between 7am to 6pm - Pager - [phone]  After 6pm go to [email or web address] - password [email or web address] [place] [facility]  Office  [phone]  CC: Primary care physician; [name], [name], MD   Note: This dictation was prepared with Dragon dictation  along with smaller phrase technology. Any transcriptional errors that result from this process are unintentional.

## 2018-01-11 NOTE — Progress Notes (Signed)
[place] Progress Note Patient Name: [name] DOB: Feb 11, 1955 MRN: [id number]   Date of Service  01/11/2018  HPI/Events of Note  62/M with cirrhosis, BPH, renal cell CA, presenting with chills and nausea. Pt was hypothermic.    CXR shows left sided pleural effusion and infiltrate.  eICU Interventions  Sepsis Acute respiratory failure  Continue empiric antibiotics.  Warming blanket. Repeat ABG on BIPAP. Plan for paracentesis.  SCDs for DVT prophylaxis.       Intervention Category Evaluation Type: New Patient Evaluation  Elsie Lincoln 01/11/2018, 2:54 AM

## 2018-01-11 NOTE — Progress Notes (Signed)
Pastoral Care Rapid Response Call    01/11/18 0135  Clinical Encounter Type  Visited With Patient;Health care provider  Visit Type Code;Spiritual support  Referral From Nurse  Consult/Referral To Chaplain  Spiritual Encounters  Spiritual Needs  (prayed silently for pt)  Stress Factors  Patient Stress Factors Not reviewed   Received Rapid Response for pt in 124.  Pt was being moved to ICU due to resp challenges.  Chap provided comp presence, empathy, and prayed quietly while medical team worked with pt.  Darcey Nora, Chaplain

## 2018-01-11 NOTE — Consult Note (Signed)
[facility] GI Inpatient Consult Note   Kathline Magic, M.D.  Reason for Consult: Decompensated liver disease   Attending Requesting Consult: Lance Coon, M.D.   History of Present Illness: Rodney Stewart is a 63 y.o. male followed by Dr. Jonathon Bellows at [facility] gastroenterology for a decompensated liver disease, cirrhosis, ascites.  Patient has metastatic stage IV renal cell carcinoma to lung and is being managed by Dr. Grayland Ormond.  Patient is admitted to the hospital with sepsis and left lower lobe pneumonia.  GI has been asked to see the patient in regards to ascites and liver disease although there appears to be no  complaint of GI bleeding. Patient appears to have need for lactulose for chronic hepatic encephalopathy but this has been stable.  Patient complains of some diffuse abdominal pain at this time and oral diuretics do not appear to have improved abdominal distention.  Past Medical History:  Past Medical History:  Diagnosis Date  . Anemia   . Ascites   . BPH (benign prostatic hyperplasia)   . Cirrhosis ([place])   . CKD (chronic kidney disease) stage 3, GFR 30-59 ml/min (HCC)   . Clear cell renal cell carcinoma ([place]) 2014   Left Nephrectomy.  . Colon polyps   . Diabetes mellitus without complication ([place])    type 2   . Dyspnea    with exertion  . GERD (gastroesophageal reflux disease)   . History of gout   . History of nephrectomy    Left  . Hypertension   . Neuropathy   . Renal Cancer    Renal Cancer  . Renal cell carcinoma of left kidney (HCC)    mets to lungs  . Umbilical hernia     Problem List: Patient Active Problem List   Diagnosis Date Noted  . Sepsis ([place]) 01/10/2018  . Constipation 12/26/2017  . Abnormal gait 12/26/2017  . Type 2 diabetes mellitus with diabetic polyneuropathy, with long-term current use of insulin ([place]) 11/06/2017  . Hypoalbuminemia 10/18/2017  . Hypothyroidism 10/03/2017  . Bilateral leg edema 10/03/2017  . Hydrocele  10/03/2017  . Protein-calorie malnutrition, severe 09/15/2017  . Ascites   . Hyponatremia 05/28/2017  . Colon polyps 04/26/2017  . Umbilical hernia 87/56/4332  . Left inguinal hernia 04/26/2017  . Gout 04/26/2017  . Fatty liver 04/26/2017  . Leukopenia 04/26/2017  . Gallstones 04/26/2017  . Type 2 diabetes mellitus with diabetic neuropathy, unspecified ([place]) 04/26/2017  . CAD (coronary artery disease) 04/26/2017  . Chronic knee pain 04/26/2017  . Protein-calorie malnutrition ([place]) 04/26/2017  . Cirrhosis ([place]) 02/17/2017  . Neutropenia associated with infection ([employer])   . Malignant neoplasm of kidney ([place])   . Thrombocytopenia ([place])   . Iron deficiency anemia 01/23/2017  . Goals of care, counseling/discussion 11/27/2016  . BPH (benign prostatic hyperplasia) 10/27/2016  . Effusion of left olecranon bursa 10/27/2016  . Foreign body in left ear 10/27/2016  . Pancytopenia ([place]) 10/27/2016  . Alcohol abuse 08/28/2016  . Nonimmune to hepatitis B virus 08/02/2016  . Metastatic renal cell carcinoma to lung ([place])   . ARF (acute renal failure) ([place]) 09/30/2015  . Type 2 diabetes mellitus with moderate nonproliferative diabetic retinopathy and without macular edema ([place]) 09/17/2014  . Cataract 08/20/2014  . Hypertension 07/02/2014  . CKD (chronic kidney disease) stage 3, GFR 30-59 ml/min (HCC) 11/30/2013  . Single kidney 02/04/2013  . Renal cell carcinoma ([place]) 01/02/2012    Past Surgical History: Past Surgical History:  Procedure Laterality Date  .  BACK SURGERY     ruptured disc 1991   . ENDOBRONCHIAL ULTRASOUND N/A 10/14/2015   Procedure: ENDOBRONCHIAL ULTRASOUND;  Surgeon: [name], MD;  Location: [facility] ORS;  Service: Pulmonary;  Laterality: N/A;  . ESOPHAGOGASTRODUODENOSCOPY (EGD) WITH PROPOFOL N/A 04/15/2017   Procedure: ESOPHAGOGASTRODUODENOSCOPY (EGD) WITH PROPOFOL;  Surgeon: Jonathon Bellows, MD;  Location: [facility] ENDOSCOPY;  Service: Gastroenterology;  Laterality: N/A;   Screen for esophageal varices  . EYE SURGERY Bilateral    Cataract Extraction with IOL  . IR RADIOLOGIST EVAL & MGMT  10/15/2017  . KNEE SURGERY Right   . NEPHRECTOMY     left kidney 2014 renal cell cancer     Allergies: No Known Allergies  Home Medications: Medications Prior to Admission  Medication Sig Dispense Refill Last Dose  . allopurinol (ZYLOPRIM) 100 MG tablet Take 1 tablet (100 mg total) by mouth daily. 90 tablet 3 Past Week at Unknown time  . ALPRAZolam (XANAX) 1 MG tablet Take 1 mg by mouth 2 (two) times daily as needed for anxiety or sleep.    Past Week at Unknown time  . aspirin EC 81 MG tablet Take 81 mg by mouth daily.   Past Week at Unknown time  . docusate sodium (COLACE) 100 MG capsule Take 1 capsule (100 mg total) by mouth 2 (two) times daily as needed for mild constipation. 60 capsule 2 Past Week at Unknown time  . furosemide (LASIX) 20 MG tablet Take 1-2 tablets (20-40 mg total) by mouth daily. In am as needed leg edema 60 tablet 2 Past Week at Unknown time  . gabapentin (NEURONTIN) 100 MG capsule Take 2 capsules (200 mg total) by mouth 3 (three) times daily. (Patient taking differently: Take 200 mg by mouth 2 (two) times daily. ) 360 capsule 5 Past Week at Unknown time  . insulin aspart (NOVOLOG) 100 UNIT/ML FlexPen Before meals 131-180 2 units, 181-240 4 units, 241-300 6 units, 301-350 8 units, 351-400 10 units, >400 12 units 15 mL 11 Past Week at Unknown time  . Insulin Detemir (LEVEMIR FLEXTOUCH) 100 UNIT/ML Pen Inject 18 Units into the skin 2 (two) times daily. With food 12 pen 12 Past Week at Unknown time  . levothyroxine (SYNTHROID, LEVOTHROID) 75 MCG tablet Take 1 tablet (75 mcg total) by mouth daily before breakfast. 30 minutes 90 tablet 0 Past Week at unknown  . lovastatin (MEVACOR) 20 MG tablet Take 1 tablet (20 mg total) by mouth daily at 6 PM. 90 tablet 3 Past Week at unknown  . potassium chloride (K-DUR) 10 MEQ tablet Take 2 tablets (20 mEq total) by mouth  daily. 5 tablet 0 Past Week at Unknown time  . rifaximin (XIFAXAN) 550 MG TABS tablet Take 1 tablet (550 mg total) by mouth 2 (two) times daily at 8 am and 10 pm. 60 tablet 2 Past Week at Unknown time  . tamsulosin (FLOMAX) 0.4 MG CAPS capsule Take 1 capsule (0.4 mg total) by mouth every other day. With supper 90 capsule 3 Past Week at Unknown time  . feeding supplement, ENSURE ENLIVE, (ENSURE ENLIVE) LIQD Take 237 mLs by mouth 3 (three) times daily between meals. 237 mL 12 Taking  . Insulin Pen Needle 32G X 4 MM MISC 1 Device by Does not apply route 2 (two) times daily. E11.9 200 each 12 Taking  . lactulose (CHRONULAC) 10 GM/15ML solution Take 45 mLs (30 g total) by mouth daily. 1800 mL 2 prn at prn  . magic mouthwash w/lidocaine SOLN Take 5 mLs  by mouth 3 (three) times daily as needed for mouth pain. Swish and spit (Patient not taking: Reported on 12/23/2017) 250 mL 0 Not Taking  . mupirocin ointment (BACTROBAN) 2 % Apply 1 application topically 3 (three) times daily. Left big toe and right back (Patient not taking: Reported on 12/23/2017) 30 g 1 Not Taking  . ONE TOUCH ULTRA TEST test strip Bid 180 each 3 Taking  . oxyCODONE (ROXICODONE) 5 MG immediate release tablet Take 1 tablet (5 mg total) by mouth every 6 (six) hours as needed for severe pain or breakthrough pain. (Patient not taking: Reported on 12/23/2017) 45 tablet 0 Not Taking  . polyethylene glycol (MIRALAX) packet Take 17 g by mouth daily. 14 each 0 prn at prn  . potassium chloride SA (K-DUR,KLOR-CON) 20 MEQ tablet Take 1 tablet (20 mEq total) by mouth daily for 3 days. 3 tablet 0 Taking   Home medication reconciliation was completed with the patient.   Scheduled Inpatient Medications:   . allopurinol  100 mg Oral Daily  . aspirin EC  81 mg Oral Daily  . enoxaparin (LOVENOX) injection  40 mg Subcutaneous Q24H  . feeding supplement (ENSURE ENLIVE)  237 mL Oral TID BM  . lactulose  30 g Oral BID  . levothyroxine  75 mcg Oral QAC  breakfast  . polyethylene glycol  17 g Oral Daily  . pravastatin  20 mg Oral q1800  . rifaximin  550 mg Oral BID AC & HS  . sodium chloride flush  3 mL Intravenous Q12H  . tamsulosin  0.4 mg Oral QODAY    Continuous Inpatient Infusions:   . sodium chloride 250 mL (01/11/18 0117)  . ceFEPime (MAXIPIME) IV 2 g (01/11/18 0442)  . magnesium sulfate 1 - 4 g bolus IVPB 4 g (01/11/18 1110)  . metronidazole 500 mg (01/11/18 1223)  . vancomycin      PRN Inpatient Medications:  sodium chloride, acetaminophen **OR** acetaminophen, diphenhydrAMINE, docusate sodium, fentaNYL (SUBLIMAZE) injection, ipratropium-albuterol, ondansetron **OR** ondansetron (ZOFRAN) IV, sodium chloride flush  Family History: family history includes Diabetes in his father; Hypertension in his father; Ovarian cancer in his mother.   GI Family History: Negative  Social History:   reports that he quit smoking about 24 years ago. His smoking use included cigarettes. He smoked 1.00 pack per day. He has never used smokeless tobacco. He reports previous alcohol use. He reports that he does not use drugs. The patient denies ETOH, tobacco, or drug use.    Review of Systems: Review of Systems - History obtained from the patient and And daughter General ROS: positive for  - fatigue, malaise and sleep disturbance Psychological ROS: negative ENT ROS: negative Allergy and Immunology ROS: negative Hematological and Lymphatic ROS: positive for - bleeding problems, fatigue and pallor negative for - swollen lymph nodes Respiratory ROS: positive for - cough, shortness of breath and sputum changes negative for - pleuritic pain Cardiovascular ROS: positive for - dyspnea on exertion negative for - chest pain Musculoskeletal ROS: negative Neurological ROS: no TIA or stroke symptoms Dermatological ROS: negative  Physical Examination: BP 118/71   Pulse 99   Temp 98.1 F (36.7 C) (Oral)   Resp 15   Ht 5\' 11"  (1.803 m)   Wt 83.8  kg   SpO2 100%   BMI 25.77 kg/m  Physical Exam Vitals signs reviewed.  HENT:     Head: Normocephalic and atraumatic.     Mouth/Throat:     Mouth: Mucous membranes are dry.  Eyes:     Pupils: Pupils are equal, round, and reactive to light.  Cardiovascular:     Rate and Rhythm: Tachycardia present.     Pulses: Normal pulses.  Pulmonary:     Effort: Pulmonary effort is normal.     Breath sounds: Rhonchi present.  Abdominal:     General: There is distension.     Tenderness: There is abdominal tenderness. There is no left CVA tenderness, guarding or rebound.     Hernia: No hernia is present.  Skin:    Coloration: Skin is jaundiced.  Neurological:     Motor: Weakness present.     Coordination: Coordination abnormal.     Data: Lab Results  Component Value Date   WBC 8.9 01/11/2018   HGB 8.7 (L) 01/11/2018   HCT 28.4 (L) 01/11/2018   MCV 80.0 01/11/2018   PLT 162 01/11/2018   Recent Labs  Lab 01/09/18 1407 01/10/18 1930 01/11/18 0047  HGB 7.0* 10.4* 8.7*   Lab Results  Component Value Date   NA 136 01/11/2018   K 3.4 (L) 01/11/2018   CL 104 01/11/2018   CO2 23 01/11/2018   BUN 28 (H) 01/11/2018   CREATININE 1.17 01/11/2018   Lab Results  Component Value Date   ALT 21 01/10/2018   AST 46 (H) 01/10/2018   ALKPHOS 403 (H) 01/10/2018   BILITOT 2.6 (H) 01/10/2018   Recent Labs  Lab 01/10/18 1930 01/11/18 1002  APTT 39*  --   INR 1.25 1.47   CBC Latest Ref Rng & Units 01/11/2018 01/10/2018 01/09/2018  WBC 4.0 - 10.5 K/uL 8.9 11.9(H) 3.1(L)  Hemoglobin 13.0 - 17.0 g/dL 8.7(L) 10.4(L) 7.0(L)  Hematocrit 39.0 - 52.0 % 28.4(L) 35.0(L) 23.4(L)  Platelets 150 - 400 K/uL 162 258 129(L)    STUDIES: Dg Chest Port 1 View  Result Date: 01/11/2018 CLINICAL DATA:  Acute respiratory failure. EXAM: PORTABLE CHEST 1 VIEW COMPARISON:  One-view chest x-ray 01/10/2017 FINDINGS: Heart size is normal. Aortic atherosclerosis is again seen. Left basilar airspace disease is  similar the prior study. Left effusion is suspected. Minimal atelectasis is present at the right base. IMPRESSION: 1. Similar appearance of left basilar airspace disease, indicating pneumonia. 2. Probable small left pleural effusion. Electronically Signed   By: San Morelle M.D.   On: 01/11/2018 08:13   Dg Chest Port 1 View  Result Date: 01/10/2018 CLINICAL DATA:  Chills and nausea. Previous history of renal cell carcinoma metastatic to lung. EXAM: PORTABLE CHEST 1 VIEW COMPARISON:  12/12/2017 FINDINGS: Shallow inspiration. Heart size and pulmonary vascularity are normal for technique. Suggestion of infiltration or atelectasis in the left lung base. Probable small left pleural effusion as seen previously. Could consider pneumonia in the appropriate clinical setting. No pneumothorax. Calcification of the aorta. Degenerative changes in the shoulders. IMPRESSION: Infiltration or atelectasis in the left lung base with small left pleural effusion. Consider pneumonia in the appropriate clinical setting. Electronically Signed   By: Lucienne Capers M.D.   On: 01/10/2018 20:00   @IMAGES @  Assessment: 1. Sepsis presumably secondary to ammonia - On Flagyl, Cefipime 2. Tense ascites - Paracentesis ordered. 3. Metastatic renal cell carcinoma. 4. DNR status.  Recommendations: 1. Continue to manage fluid status, paracentesis when feasible.  2. Being considered for TIPS in outpatient setting. 3. Agree with antibiotics as ordered. 4. Poor prognosis, following.   Thank you for the consult. Please call with questions or concerns.  [place], [place], "Lanny Hurst" MD [facility] Gastroenterology [address]  [place], [place] [address] [phone]  01/11/2018 12:41 PM

## 2018-01-11 NOTE — Consult Note (Signed)
PULMONARY / CRITICAL CARE MEDICINE  Name: ANGELA PLATNER MRN: [id number] DOB: 1955/03/06    LOS: 1  Referring Provider: Dr. Marcille Blanco Reason for Referral: Acute respiratory distress Brief patient description: 63 year old male admitted with abscess of unknown source, decompensated ascites and acute on chronic renal failure; transferred to the ICU for acute respiratory distress  HPI: This is a 63 year old male with a medical history as indicated below, who presented to the ED with fever chills, nausea and and generalized weakness and aches.  At the ED, he was found to be hypothermic, with a lactic acid of 4.26, WBC of 11.9, creatinine of 1.29, and elevated LFTs.  His chest x-ray showed left basilar airspace disease suggestive of pneumonia and a left pleural effusion.  He was started on broad-spectrum antibiotics and admitted to the floor.  Upon arrival in the floor, patient became acutely tachypneic and hypoxic hence a rapid response was called.  He was placed on BiPAP and transferred to the ICU.  Patient is complaining of abdominal and generalized pain, persistent dyspnea and is requesting to be taken off BiPAP.  Past Medical History:  Diagnosis Date  . Anemia   . Ascites   . BPH (benign prostatic hyperplasia)   . Cirrhosis ([place])   . CKD (chronic kidney disease) stage 3, GFR 30-59 ml/min (HCC)   . Clear cell renal cell carcinoma ([place]) 2014   Left Nephrectomy.  . Colon polyps   . Diabetes mellitus without complication ([place])    type 2   . Dyspnea    with exertion  . GERD (gastroesophageal reflux disease)   . History of gout   . History of nephrectomy    Left  . Hypertension   . Neuropathy   . Renal Cancer    Renal Cancer  . Renal cell carcinoma of left kidney (HCC)    mets to lungs  . Umbilical hernia    Past Surgical History:  Procedure Laterality Date  . BACK SURGERY     ruptured disc 1991   . ENDOBRONCHIAL ULTRASOUND N/A 10/14/2015   Procedure: ENDOBRONCHIAL ULTRASOUND;   Surgeon: [name], MD;  Location: [facility] ORS;  Service: Pulmonary;  Laterality: N/A;  . ESOPHAGOGASTRODUODENOSCOPY (EGD) WITH PROPOFOL N/A 04/15/2017   Procedure: ESOPHAGOGASTRODUODENOSCOPY (EGD) WITH PROPOFOL;  Surgeon: Jonathon Bellows, MD;  Location: [facility] ENDOSCOPY;  Service: Gastroenterology;  Laterality: N/A;  Screen for esophageal varices  . EYE SURGERY Bilateral    Cataract Extraction with IOL  . IR RADIOLOGIST EVAL & MGMT  10/15/2017  . KNEE SURGERY Right   . NEPHRECTOMY     left kidney 2014 renal cell cancer    Prior to Admission medications   Medication Sig Start Date End Date Taking? Authorizing Provider  amLODipine (NORVASC) 5 MG tablet Take 5 mg by mouth daily.   Yes [provider]  clopidogrel (PLAVIX) 75 MG tablet Take 75 mg by mouth daily.   Yes [provider]  donepezil (ARICEPT) 5 MG tablet Take 1 tablet (5 mg total) by mouth at bedtime. 08/26/17 10/05/17 Yes Sowles, Drue Stager, MD  empagliflozin (JARDIANCE) 25 MG TABS tablet Take 25 mg by mouth daily.   Yes [provider]  glycopyrrolate (ROBINUL) 1 MG tablet Take 1 mg by mouth 2 (two) times daily.   Yes [provider]  insulin aspart (NOVOLOG FLEXPEN) 100 UNIT/ML FlexPen Inject 12 Units into the skin 2 (two) times daily.   Yes [provider]  insulin aspart (NOVOLOG) 100 UNIT/ML FlexPen Inject 18 Units into  the skin daily. At 1700   Yes [provider]  Insulin Degludec-Liraglutide (XULTOPHY) 100-3.6 UNIT-MG/ML SOPN Inject 50 Units into the skin daily.   Yes [provider]  levETIRAcetam (KEPPRA) 500 MG tablet Take 500 mg by mouth 2 (two) times daily.   Yes [provider]  lipase/protease/amylase (CREON) 12000 units CPEP capsule Take 6,000 Units by mouth 3 (three) times daily before meals.   Yes [provider]  lipase/protease/amylase (CREON) 12000 units CPEP capsule Take 3,000 Units by mouth at bedtime. With snack   Yes [provider]  lisinopril (PRINIVIL,ZESTRIL) 5 MG tablet Take 5 mg by mouth daily.   Yes [provider]  metoprolol succinate (TOPROL-XL) 25 MG 24 hr tablet Take 1 tablet (25 mg total) by mouth daily. 08/26/17  Yes Sowles, Drue Stager, MD  rosuvastatin (CRESTOR) 40 MG tablet Take 1 tablet (40 mg total) by mouth daily. 08/26/17 10/05/17 Yes Steele Sizer, MD  aspirin EC 81 MG tablet Take 81 mg by mouth daily.    [provider]  famotidine (PEPCID) 20 MG tablet Take 1 tablet (20 mg total) by mouth 2 (two) times daily. 08/26/17 09/25/17  Steele Sizer, MD  gabapentin (NEURONTIN) 300 MG capsule Take 1 capsule (300 mg total) by mouth 2 (two) times daily. 08/26/17 09/25/17  Steele Sizer, MD  insulin glargine (LANTUS) 100 UNIT/ML injection Inject 0.1 mLs (10 Units total) into the skin daily. 08/26/17 09/25/17  Steele Sizer, MD  lacosamide 100 MG TABS Take 1 tablet (100 mg total) by mouth 2 (two) times daily. Patient not taking: Reported on 10/05/2017 01/11/17   Fritzi Mandes, MD  promethazine (PHENERGAN) 12.5 MG tablet Take 1 tablet (12.5 mg total) by mouth every 6 (six) hours as needed for nausea or vomiting. Patient not taking: Reported on 10/05/2017 10/30/16   Stark Klein, MD  sertraline (ZOLOFT) 25 MG tablet Take 1 tablet (25 mg total) by mouth daily. Patient not taking: Reported on 10/05/2017 08/26/17   Steele Sizer, MD   Allergies No Known Allergies  Family History Family History  Problem Relation Age of Onset  . Ovarian cancer Mother   . Diabetes Father   . Hypertension Father    Social History  reports that he quit smoking about 24 years ago. His smoking use included cigarettes. He smoked 1.00 pack per day. He has never used smokeless tobacco. He reports previous alcohol use. He reports that he does not use drugs.  Review Of Systems: Unable to obtain as patient is on continuous BiPAP  VITAL SIGNS: BP 119/76   Pulse (!) 115   Temp 98.6 F (37 C)   Resp 18   Ht 5'  11" (1.803 m)   Wt 83.8 kg   SpO2 96%   BMI 25.77 kg/m   HEMODYNAMICS:    VENTILATOR SETTINGS:    INTAKE / OUTPUT: No intake/output data recorded.  PHYSICAL EXAMINATION: General: Cachectic, acutely ill looking HEENT: PERRLA, trachea midline, mild JVD Neuro: Alert and oriented x3, no focal deficits Cardiovascular: Apical pulse tachycardic, regular, S1-S2, no murmur regurg or gallop, +2 pulses bilaterally, trace edema Lungs: Increased work of breathing, bilateral breath sounds, diminished in the bases Abdomen: Distended, hypoactive bowel sounds, positive fluid wave, increased liver span Musculoskeletal: Positive range of motion, no joint deformities Skin: Cool to touch, no rash or lesions  LABS:  BMET Recent Labs  Lab 01/09/18 1407 01/10/18 1930 01/11/18 0047  NA 134* 136 136  K 3.5 3.7 3.4*  CL 100 98 104  CO2 28 25 23   BUN 27* 26* 28*  CREATININE 1.47* 1.29* 1.17  GLUCOSE 156* 85 83    Electrolytes Recent Labs  Lab 01/09/18 1407 01/10/18 1930 01/11/18 0047 01/11/18 0552  CALCIUM 7.8* 8.2* 7.6*  --   MG  --   --   --  1.4*  PHOS  --   --   --  4.3    CBC Recent Labs  Lab 01/09/18 1407 01/10/18 1930 01/11/18 0047  WBC 3.1* 11.9* 8.9  HGB 7.0* 10.4* 8.7*  HCT 23.4* 35.0* 28.4*  PLT 129* 258 162    Coag's Recent Labs  Lab 01/10/18 1930  APTT 39*  INR 1.25    Sepsis Markers Recent Labs  Lab 01/10/18 1930 01/10/18 1934 01/11/18 0047  LATICACIDVEN  --  4.26* 1.7  PROCALCITON 0.58  --   --     ABG Recent Labs  Lab 01/11/18 0221  PHART 7.37  PCO2ART 37  PO2ART 100    Liver Enzymes Recent Labs  Lab 01/09/18 1407 01/10/18 1930  AST 37 46*  ALT 20 21  ALKPHOS 358* 403*  BILITOT 1.9* 2.6*  ALBUMIN 2.0* 2.3*    Cardiac Enzymes Recent Labs  Lab 01/10/18 1930  TROPONINI <0.03    Glucose No results for input(s): GLUCAP in the last 168 hours.  Imaging Dg Chest Port 1 View  Result Date: 01/10/2018 CLINICAL DATA:   Chills and nausea. Previous history of renal cell carcinoma metastatic to lung. EXAM: PORTABLE CHEST 1 VIEW COMPARISON:  12/12/2017 FINDINGS: Shallow inspiration. Heart size and pulmonary vascularity are normal for technique. Suggestion of infiltration or atelectasis in the left lung base. Probable small left pleural effusion as seen previously. Could consider pneumonia in the appropriate clinical setting. No pneumothorax. Calcification of the aorta. Degenerative changes in the shoulders. IMPRESSION: Infiltration or atelectasis in the left lung base with small left pleural effusion. Consider pneumonia in the appropriate clinical setting. Electronically Signed   By: Lucienne Capers M.D.   On: 01/10/2018 20:00     STUDIES:  Paracentesis pending  CULTURES: Blood cultures x2  ANTIBIOTICS: Vancomycin and cefepime  SIGNIFICANT EVENTS: 01/10/2018: Admitted 01/11/2018 transferred to the ICU  LINES/TUBES: Peripheral IVs  DISCUSSION: 63 year old male with a complex medical history, severe debility, presenting with sepsis of unknown source, acute hypoxic respiratory failure secondary to sepsis and ascites requiring paracentesis.  Overall prognosis is poor  ASSESSMENT  Sepsis- due to pneumonia but the possibility of spontaneous bacterial peritonitis cannot be ruled out HCAP Acute hypoxic respiratory failure requiring BiPAP Decompensated liver cirrhosis with ascites-rule out spontaneous bacterial peritonitis Hyperammonemia Renal cell carcinoma with lung metastasis Type 2 diabetes Chronic kidney disease stage III  PLAN Hemodynamic monitoring per ICU protocol Continuous BiPAP and transition to high flow nasal cannula or regular nasal cannula as tolerated Antibiotics as above Follow-up cultures Nebulized bronchodilators GI consult IR consult for paracentesis Blood glucose monitoring with sliding scale insulin coverage Trend creatinine Monitor and correct electrolytes Palliative care  consult Continue home dose of rifaximin and lactulose  Best Practice: Code Status: DNR Diet: N.p.o. while on BiPAP and on liquid diet when off BiPAP and transition to full 2 g sodium diet once off BiPAP GI prophylaxis: Not indicated VTE prophylaxis: Subcu Lovenox  FAMILY  - Updates: No family at bedside.  Will update when available.  Plan of care reviewed with patient.  Kalissa Grays S. [facility] ANP-BC Pulmonary and Critical Care Medicine [facility] Pager [phone] or [phone]  NB: This document was  prepared using Systems analyst and may include unintentional dictation errors.    01/11/2018, 7:00 AM

## 2018-01-12 LAB — POTASSIUM: Potassium: 4.7 mmol/L (ref 3.5–5.1)

## 2018-01-12 LAB — MAGNESIUM: Magnesium: 2.2 mg/dL (ref 1.7–2.4)

## 2018-01-12 MED ORDER — PANTOPRAZOLE SODIUM 40 MG IV SOLR
40.0000 mg | Freq: Two times a day (BID) | INTRAVENOUS | Status: DC
  Administered 2018-01-12 – 2018-01-14 (×5): 40 mg via INTRAVENOUS
  Filled 2018-01-12 (×5): qty 40

## 2018-01-12 MED ORDER — FUROSEMIDE 10 MG/ML IJ SOLN
40.0000 mg | Freq: Two times a day (BID) | INTRAMUSCULAR | Status: DC
  Administered 2018-01-12 – 2018-01-14 (×5): 40 mg via INTRAVENOUS
  Filled 2018-01-12 (×6): qty 4

## 2018-01-12 MED ORDER — FENTANYL 12 MCG/HR TD PT72
12.5000 ug | MEDICATED_PATCH | TRANSDERMAL | Status: DC
  Administered 2018-01-12: 12:00:00 12.5 ug via TRANSDERMAL
  Filled 2018-01-12: qty 1

## 2018-01-12 MED ORDER — ADULT MULTIVITAMIN W/MINERALS CH
1.0000 | ORAL_TABLET | Freq: Every day | ORAL | Status: DC
  Administered 2018-01-13 – 2018-01-21 (×7): 1 via ORAL
  Filled 2018-01-12 (×8): qty 1

## 2018-01-12 MED ORDER — ONDANSETRON HCL 4 MG/2ML IJ SOLN
4.0000 mg | Freq: Four times a day (QID) | INTRAMUSCULAR | Status: DC
  Administered 2018-01-12 – 2018-01-14 (×8): 4 mg via INTRAVENOUS
  Filled 2018-01-12 (×9): qty 2

## 2018-01-12 NOTE — Progress Notes (Signed)
Chaplain responded to a page for support of the daughter of the Pt. Chaplain practiced active listen and pastoral presence ans daughter emoted about the pending death of her father. Nurse explained the some of the symptoms of the journey. Daughter expressed desire to not being present in the room because it is so difficult but her father asked her to be there therefore she felt obligated to be there. Chaplain will be alert for call into the night.    01/12/18 0000  Clinical Encounter Type  Visited With Family  Visit Type Critical Care  Referral From Nurse  Consult/Referral To Chaplain  Spiritual Encounters  Spiritual Needs Prayer;Emotional

## 2018-01-12 NOTE — Progress Notes (Signed)
[facility] NAME: Rodney Stewart    MR#:  [id number]  DATE OF BIRTH:  05-23-1955  SUBJECTIVE: .  Patient complains of severe abdominal pain, requesting fentanyl patch in addition to Dilaudid.  Also used agreeable for tach.  Patient can get paracentesis today as it is a weekend, can get paracentesis tomorrow.  Afebrile.  Otherwise hemodynamically stable but has nausea, labs of start of Phenergan as well.  CHIEF COMPLAINT:   Chief Complaint  Patient presents with  . Chills  . Nausea    REVIEW OF SYSTEMS:   ROS CONSTITUTIONAL: No fever, is very poor p.o. intake..  Patient appears very cachectic. EYES: No blurred or double vision.  EARS, NOSE, AND THROAT: No tinnitus or ear pain.  RESPIRATORY: No cough, shortness of breath, wheezing or hemoptysis.  CARDIOVASCULAR: No chest pain, orthopnea, edema.  GASTROINTESTINA symptoms; distended with abdominal pain, ascites.  And has umbilical hernia.  A ,abdominal pain GENITOURINARY: No dysuria, hematuria.  ENDOCRINE: No polyuria, nocturia,  HEMATOLOGY: Anemia, thrombocytopenia due to liver cirrhosis SKIN: No rash or lesion. MUSCULOSKELETAL: No joint pain or arthritis.   NEUROLOGIC: No tingling, numbness, weakness.  PSYCHIATRY: No anxiety or depression.   DRUG ALLERGIES:  No Known Allergies  VITALS:  Blood pressure 126/77, pulse 98, temperature 97.7 F (36.5 C), temperature source Oral, resp. rate 18, height 5\' 11"  (1.803 m), weight 83.8 kg, SpO2 96 %.  PHYSICAL EXAMINATION:  GENERAL:  63 y.o.-year-old patient lying in the bed with no acute distress.  EYES: Pupils equal, round, reactive to light and accommodation. No scleral icterus. Extraocular muscles intact.  HEENT: Head atraumatic, normocephalic. Oropharynx and nasopharynx clear.  NECK:  Supple, no jugular venous distention. No thyroid enlargement, no tenderness.  LUNGS diminished air entry bilaterally cARDIOVASCULAR: S1, S2  normal. No murmurs, rubs, or gallops.  ABDOMEN: Distended, fluid thrill present. EXTREMITIES: No pedal edema, cyanosis, or clubbing.  NEUROLOGIC: Cranial nerves II through XII are intact. Muscle strength 5/5 in all extremities. Sensation intact. Gait not checked.  PSYCHIATRIC: The patient is alert and oriented x 3.  SKIN: No obvious rash, lesion, or ulcer.    LABORATORY PANEL:   CBC Recent Labs  Lab 01/11/18 0047  WBC 8.9  HGB 8.7*  HCT 28.4*  PLT 162   ------------------------------------------------------------------------------------------------------------------  Chemistries  Recent Labs  Lab 01/10/18 1930 01/11/18 0047 01/11/18 0552  NA 136 136  --   K 3.7 3.4*  --   CL 98 104  --   CO2 25 23  --   GLUCOSE 85 83  --   BUN 26* 28*  --   CREATININE 1.29* 1.17  --   CALCIUM 8.2* 7.6*  --   MG  --   --  1.4*  AST 46*  --   --   ALT 21  --   --   ALKPHOS 403*  --   --   BILITOT 2.6*  --   --    ------------------------------------------------------------------------------------------------------------------  Cardiac Enzymes Recent Labs  Lab 01/10/18 1930  TROPONINI <0.03   ------------------------------------------------------------------------------------------------------------------  RADIOLOGY:  Dg Chest Port 1 View  Result Date: 01/11/2018 CLINICAL DATA:  Acute respiratory failure. EXAM: PORTABLE CHEST 1 VIEW COMPARISON:  One-view chest x-ray 01/10/2017 FINDINGS: Heart size is normal. Aortic atherosclerosis is again seen. Left basilar airspace disease is similar the prior study. Left effusion is suspected. Minimal atelectasis is present at the right base. IMPRESSION: 1. Similar appearance of left basilar airspace  disease, indicating pneumonia. 2. Probable small left pleural effusion. Electronically Signed   By: San Morelle M.D.   On: 01/11/2018 08:13   Dg Chest Port 1 View  Result Date: 01/10/2018 CLINICAL DATA:  Chills and nausea. Previous  history of renal cell carcinoma metastatic to lung. EXAM: PORTABLE CHEST 1 VIEW COMPARISON:  12/12/2017 FINDINGS: Shallow inspiration. Heart size and pulmonary vascularity are normal for technique. Suggestion of infiltration or atelectasis in the left lung base. Probable small left pleural effusion as seen previously. Could consider pneumonia in the appropriate clinical setting. No pneumothorax. Calcification of the aorta. Degenerative changes in the shoulders. IMPRESSION: Infiltration or atelectasis in the left lung base with small left pleural effusion. Consider pneumonia in the appropriate clinical setting. Electronically Signed   By: Lucienne Capers M.D.   On: 01/10/2018 20:00    EKG:   Orders placed or performed during the hospital encounter of 01/10/18  . EKG 12-Lead  . EKG 12-Lead    ASSESSMENT AND PLAN:  [age]-year-old male patient with history of liver cirrhosis, clear-cell renal cancer comes in because of generalized weakness, body aches and found to have hypothermia, admitted for septic shock, pneumonia in left lung, admitted to ICU.  #1 sepsis present on admission secondary to pneumonia: Improving, off the BiPAP, continue nasal cannula as needed, continue empiric antibiotics. 2.  Renal cell CA with lung mets, getting chemotherapy 3.  History of liver cirrhosis, ascites now has abdominal pain, rule out SBP, patient needs sono-guided paracentesis, continue IV antibiotics 4.  Diabetes mellitus type 2; stable. 5.  Liver cirrhosis with auto-anticoagulation, INR 1.47.,  Need to rule out SBP, continue empiric antibiotics, needs paracentesis, ordered, 6.  Severe malnutrition secondary to cancer, will liberalize diet today 7.  Nausea, vomiting use Phenergan, Zofran, add IV PPI. 8.  Acute on chronic abdominal pain, continue IV Dilaudid, added small dose fentanyl patch 9.  Prognosis poor, consult palliative care tomorrow morning.   All the records are reviewed and case discussed with Care  Management/Social Workerr. Management plans discussed with the patient, family and they are in agreement.  CODE STATUS: Full code  TOTAL TIME TAKING CARE OF THIS PATIENT: 38 minutes.   POSSIBLE D/C IN 1-2 DAYS, DEPENDING ON CLINICAL CONDITION.   Epifanio Lesches M.D on 01/12/2018 at 10:48 AM  Between 7am to 6pm - Pager - [phone]  After 6pm go to [email or web address] - password [email or web address] [facility]  Office  [phone]  CC: Primary care physician; [name], [name], MD   Note: This dictation was prepared with Dragon dictation along with smaller phrase technology. Any transcriptional errors that result from this process are unintentional.

## 2018-01-12 NOTE — Progress Notes (Signed)
Initial Nutrition Assessment  DOCUMENTATION CODES:   Severe malnutrition in context of chronic illness  INTERVENTION:   Ensure Enlive po TID between meals, each supplement provides 350 kcal and 20 grams of protein.  MVI daily   Liberalize diet  Pt likely at high refeed risk; recommend monitor magnesium, potassium, and phosphorus daily until labs stable  NUTRITION DIAGNOSIS:   Severe Malnutrition related to cancer and cancer related treatments as evidenced by severe fat depletions, severe muscle depletions  GOAL:   Patient will meet greater than or equal to 90% of their needs  MONITOR:   PO intake, Supplement acceptance, Labs, Weight trends, Skin, I & O's  REASON FOR ASSESSMENT:   Malnutrition Screening Tool    ASSESSMENT:   63 y/o male with h/o etoh abuse, decompensated liver disease, cirrhosis, ascites, metastatic stage IV renal cell carcinoma to lung admitted to the hospital with sepsis and left lower lobe pneumonia.   Pt familiar to nutrition department from multiple previous admits. Pt with poor appetite and oral intake at baseline. Pt reports early satiety and taste changes. Pt with 36lb(19.7%) wt loss on his previous admit in October. Per chart, pt with weight gain since last admit likely r/t fluid changes. Pt with ascites and edema, gets paracentesis' intermittently. It is difficult to determine if pt has had any true weight changes. Per chart, pt ate 50% of his lunch yesterday. Pt is being offered Ensure. Pt likely at high refeeding risk. Pt with poor prognosis per MD note. RD will monitor for GOC.    Medications reviewed and include: allopurinol, aspirin, lactulose, synthroid, zofran, cefepime, metronidazole, vancomycin   Labs reviewed: K 3.4(L), P 4.3 wnl, Mg 1.4(L) Ammonia 46(H)  Diet Order:   Diet Order            Diet Carb Modified Fluid consistency: Thin; Room service appropriate? Yes  Diet effective now             EDUCATION NEEDS:   No education  needs have been identified at this time  Skin:  Skin Assessment: Reviewed RN Assessment(Ecchymosis )  Last BM:  1/11- TYPE 7  Height:   Ht Readings from Last 1 Encounters:  01/11/18 5\' 11"  (1.803 m)    Weight:   Wt Readings from Last 1 Encounters:  01/11/18 83.8 kg    Ideal Body Weight:  78.2 kg  BMI:  Body mass index is 25.77 kg/m.  Estimated Nutritional Needs:   Kcal:  2000-2300kcal/day   Protein:  102-116g/day   Fluid:  per MD  Koleen Distance MS, RD, LDN Pager #- [phone] Office#- [phone] After Hours Pager: [phone]

## 2018-01-12 NOTE — Progress Notes (Signed)
Spoke with patient's brother Delfino Lovett Whittington/updated about patient's medical condition over the phone he is a Merchandiser, retail in [place],

## 2018-01-13 ENCOUNTER — Ambulatory Visit: Admission: RE | Admit: 2018-01-13 | Payer: Medicare HMO | Source: Ambulatory Visit

## 2018-01-13 ENCOUNTER — Inpatient Hospital Stay: Payer: Medicare HMO

## 2018-01-13 DIAGNOSIS — J189 Pneumonia, unspecified organism: Secondary | ICD-10-CM

## 2018-01-13 DIAGNOSIS — Z515 Encounter for palliative care: Secondary | ICD-10-CM

## 2018-01-13 DIAGNOSIS — J9600 Acute respiratory failure, unspecified whether with hypoxia or hypercapnia: Secondary | ICD-10-CM

## 2018-01-13 DIAGNOSIS — J181 Lobar pneumonia, unspecified organism: Secondary | ICD-10-CM

## 2018-01-13 LAB — TYPE AND SCREEN
ABO/RH(D): A POS
Antibody Screen: NEGATIVE
Unit division: 0

## 2018-01-13 LAB — LACTATE DEHYDROGENASE, PLEURAL OR PERITONEAL FLUID: LD FL: 36 U/L — AB (ref 3–23)

## 2018-01-13 LAB — BASIC METABOLIC PANEL
Anion gap: 10 (ref 5–15)
BUN: 54 mg/dL — ABNORMAL HIGH (ref 8–23)
CALCIUM: 7.8 mg/dL — AB (ref 8.9–10.3)
CO2: 21 mmol/L — AB (ref 22–32)
Chloride: 106 mmol/L (ref 98–111)
Creatinine, Ser: 1.6 mg/dL — ABNORMAL HIGH (ref 0.61–1.24)
GFR calc Af Amer: 53 mL/min — ABNORMAL LOW (ref >60)
GFR calc non Af Amer: 46 mL/min — ABNORMAL LOW (ref >60)
Glucose, Bld: 382 mg/dL — ABNORMAL HIGH (ref 70–99)
Potassium: 4.4 mmol/L (ref 3.5–5.1)
Sodium: 137 mmol/L (ref 135–145)

## 2018-01-13 LAB — GLUCOSE, PLEURAL OR PERITONEAL FLUID: GLUCOSE FL: 349 mg/dL

## 2018-01-13 LAB — PREPARE RBC (CROSSMATCH)

## 2018-01-13 LAB — BODY FLUID CELL COUNT WITH DIFFERENTIAL
Eos, Fluid: 0 %
Lymphs, Fluid: 79 %
Monocyte-Macrophage-Serous Fluid: 19 %
Neutrophil Count, Fluid: 2 %
Total Nucleated Cell Count, Fluid: 148 cu mm

## 2018-01-13 LAB — PROTEIN, PLEURAL OR PERITONEAL FLUID: Total protein, fluid: <3 g/dL

## 2018-01-13 LAB — BPAM RBC
Blood Product Expiration Date: 202001272359
Unit Type and Rh: 6200

## 2018-01-13 LAB — GLUCOSE, CAPILLARY: Glucose-Capillary: 71 mg/dL (ref 70–99)

## 2018-01-13 LAB — BILIRUBIN, TOTAL: Total Bilirubin: 1.6 mg/dL — ABNORMAL HIGH (ref 0.3–1.2)

## 2018-01-13 MED ORDER — SODIUM CHLORIDE 0.9 % IV SOLN
2.0000 g | INTRAVENOUS | Status: DC
  Administered 2018-01-13 – 2018-01-15 (×3): 2 g via INTRAVENOUS
  Filled 2018-01-13: qty 20
  Filled 2018-01-13: qty 2
  Filled 2018-01-13: qty 20
  Filled 2018-01-13 (×2): qty 2

## 2018-01-13 NOTE — Progress Notes (Signed)
Inpatient Diabetes Program Recommendations  AACE/ADA: New Consensus Statement on Inpatient Glycemic Control (2015)  Target Ranges:  Prepandial:   less than 140 mg/dL      Peak postprandial:   less than 180 mg/dL (1-2 hours)      Critically ill patients:  140 - 180 mg/dL   Results for Rodney Stewart, Rodney Stewart (MRN [id number]) as of 01/13/2018 10:03  Ref. Range 01/09/2018 14:07 01/10/2018 19:30 01/11/2018 00:47 01/13/2018 04:38  Glucose Latest Ref Range: 70 - 99 mg/dL 156 (H) 85 83 382 (H)    Admit: Sepsis secondary to pneumonia  History: Diabetes, Renal cell CA with lung mets, getting chemotherapy, Liver cirrhosis  Home DM Meds: Levemir 18 units BID       Novolog 2-12 units TID per SSI  Current Orders: None     MD- Please consider placing orders for Novolog Sensitive Correction Scale/ SSI (0-9 units) TID AC + HS     --Will follow patient during hospitalization--  Wyn Quaker RN, MSN, CDE Diabetes Coordinator Inpatient Glycemic Control Team Team Pager: [phone] (8a-5p)

## 2018-01-13 NOTE — Care Management Important Message (Signed)
Important Message  Patient Details  Name: BRAYSEN CLOWARD MRN: [id number] Date of Birth: 1955-04-05   Medicare Important Message Given:  Yes    Juliann Pulse A Breeze Angell 01/13/2018, 11:50 AM

## 2018-01-13 NOTE — Consult Note (Signed)
[facility]  [phone] [phone]   Name: Rodney Stewart Date: 01/13/2018 MRN: [id number]  DOB: Oct 04, 1955  Patient Care Team: [name], [name], MD as PCP - General (Internal Medicine)    REASON FOR CONSULTATION: Palliative Care consult requested for this 63 y.o. male with multiple medical problems including stage IV renal cell carcinoma status post left nephrectomy (May 2014) metastatic to lung, advanced decompensated cirrhosis with recurrent ascites, and CKD 3. Patient's RCC is being managed with second line nivolumab. He was hospitalized 10/18/2017 to 10/20/2017 with hepatorenal syndrome.  He is now admitted 01/10/18 with sepsis from PNA. He initially required BIPAP due to respiratory failure. Palliative care was consulted to help establish goals of care.  SOCIAL HISTORY:    Patient is divorced.  Lives at home alone.  Has two daughters who are involved in his care.  Patient used to work in [employer] fund[employer].  ADVANCE DIRECTIVES:  Does not have.  CODE STATUS: DNR  PAST MEDICAL HISTORY: Past Medical History:  Diagnosis Date  . Anemia   . Ascites   . BPH (benign prostatic hyperplasia)   . Cirrhosis ([place])   . CKD (chronic kidney disease) stage 3, GFR 30-59 ml/min (HCC)   . Clear cell renal cell carcinoma ([place]) 2014   Left Nephrectomy.  . Colon polyps   . Diabetes mellitus without complication ([place])    type 2   . Dyspnea    with exertion  . GERD (gastroesophageal reflux disease)   . History of gout   . History of nephrectomy    Left  . Hypertension   . Neuropathy   . Renal Cancer    Renal Cancer  . Renal cell carcinoma of left kidney (HCC)    mets to lungs  . Umbilical hernia     PAST SURGICAL HISTORY:  Past Surgical History:  Procedure Laterality Date  . BACK SURGERY     ruptured disc 1991   . ENDOBRONCHIAL ULTRASOUND N/A 10/14/2015   Procedure: ENDOBRONCHIAL ULTRASOUND;  Surgeon: [name], MD;  Location: [facility] ORS;  Service: Pulmonary;  Laterality: N/A;  . ESOPHAGOGASTRODUODENOSCOPY (EGD) WITH PROPOFOL N/A 04/15/2017   Procedure: ESOPHAGOGASTRODUODENOSCOPY (EGD) WITH PROPOFOL;  Surgeon: Jonathon Bellows, MD;  Location: [facility] ENDOSCOPY;  Service: Gastroenterology;  Laterality: N/A;  Screen for esophageal varices  . EYE SURGERY Bilateral    Cataract Extraction with IOL  . IR RADIOLOGIST EVAL & MGMT  10/15/2017  . KNEE SURGERY Right   . NEPHRECTOMY     left kidney 2014 renal cell cancer     HEMATOLOGY/ONCOLOGY HISTORY:  Oncology History   Patient underwent left nephrectomy on May 16, 2012 which revealed a clear cell grade 2 renal cell carcinoma, stage TIIIa, N0, M0. Tumor size of 16 cm. Patient was noted to have renal vein involvement, but no other structures were involved. 0 of 2 lymph nodes were negative for disease. PET scan on October 20, 2015 revealed metastatic disease and patient was initiated on Votrient. This was subsequently discontinued in March 2018 secondary to progression of disease. Patient initiated second line treatment with nivolumab on Monday, April 02, 2016     Metastatic renal cell carcinoma to lung [facility])    Initial Diagnosis    Metastatic renal cell carcinoma to lung ([place])     ALLERGIES:  has No Known Allergies.  MEDICATIONS:  Current Facility-Administered Medications  Medication Dose Route Frequency Provider Last Rate Last Dose  . 0.9 %  sodium chloride  infusion  250 mL Intravenous PRN Saundra Shelling, MD   Stopped at 01/11/18 0527  . acetaminophen (TYLENOL) tablet 650 mg  650 mg Oral Q6H PRN Saundra Shelling, MD       Or  . acetaminophen (TYLENOL) suppository 650 mg  650 mg Rectal Q6H PRN Pyreddy, Reatha Harps, MD      . allopurinol (ZYLOPRIM) tablet 100 mg  100 mg Oral Daily Pyreddy, Reatha Harps, MD   100 mg at 01/13/18 0952  . aspirin EC tablet 81 mg  81 mg Oral Daily Saundra Shelling, MD   81 mg at 01/13/18 0952  . cefTRIAXone (ROCEPHIN) 2 g in sodium  chloride 0.9 % 100 mL IVPB  2 g Intravenous Q24H Epifanio Lesches, MD 200 mL/hr at 01/13/18 1746 2 g at 01/13/18 1746  . diphenhydrAMINE (BENADRYL) capsule 25 mg  25 mg Oral QHS PRN Lance Coon, MD   25 mg at 01/12/18 2324  . docusate sodium (COLACE) capsule 100 mg  100 mg Oral BID PRN Pyreddy, Reatha Harps, MD      . feeding supplement (ENSURE ENLIVE) (ENSURE ENLIVE) liquid 237 mL  237 mL Oral TID BM Pyreddy, Pavan, MD   237 mL at 01/13/18 1449  . fentaNYL (DURAGESIC - dosed mcg/hr) 12.5 mcg  12.5 mcg Transdermal Q72H Epifanio Lesches, MD   12.5 mcg at 01/12/18 1228  . furosemide (LASIX) injection 40 mg  40 mg Intravenous Q12H Epifanio Lesches, MD   40 mg at 01/13/18 0949  . gabapentin (NEURONTIN) capsule 200 mg  200 mg Oral TID Dustin Flock, MD   200 mg at 01/13/18 1704  . HYDROmorphone (DILAUDID) injection 0.5 mg  0.5 mg Intravenous Q4H PRN Lance Coon, MD   0.5 mg at 01/12/18 2305  . ipratropium-albuterol (DUONEB) 0.5-2.5 (3) MG/3ML nebulizer solution 3 mL  3 mL Nebulization Q6H PRN Tukov-Yual, Magdalene S, NP      . lactulose (CHRONULAC) 10 GM/15ML solution 30 g  30 g Oral BID Pyreddy, Reatha Harps, MD   30 g at 01/13/18 1254  . levothyroxine (SYNTHROID, LEVOTHROID) tablet 75 mcg  75 mcg Oral QAC breakfast Saundra Shelling, MD   75 mcg at 01/13/18 0949  . multivitamin with minerals tablet 1 tablet  1 tablet Oral Daily Epifanio Lesches, MD   1 tablet at 01/13/18 1449  . ondansetron (ZOFRAN) tablet 4 mg  4 mg Oral Q6H PRN Saundra Shelling, MD       Or  . ondansetron (ZOFRAN) injection 4 mg  4 mg Intravenous Q6H PRN Saundra Shelling, MD   4 mg at 01/12/18 0916  . ondansetron (ZOFRAN) injection 4 mg  4 mg Intravenous Q6H Epifanio Lesches, MD   4 mg at 01/13/18 1823  . pantoprazole (PROTONIX) injection 40 mg  40 mg Intravenous Q12H Epifanio Lesches, MD   40 mg at 01/13/18 0949  . polyethylene glycol (MIRALAX / GLYCOLAX) packet 17 g  17 g Oral Daily Pyreddy, Reatha Harps, MD   17 g at 01/12/18  1205  . pravastatin (PRAVACHOL) tablet 20 mg  20 mg Oral q1800 Saundra Shelling, MD   20 mg at 01/13/18 1704  . promethazine (PHENERGAN) injection 12.5 mg  12.5 mg Intravenous Q6H PRN Dustin Flock, MD   12.5 mg at 01/12/18 0639  . rifaximin (XIFAXAN) tablet 550 mg  550 mg Oral BID AC & HS Saundra Shelling, MD   550 mg at 01/13/18 0949  . sodium chloride flush (NS) 0.9 % injection 3 mL  3 mL Intravenous Q12H Saundra Shelling, MD  3 mL at 01/12/18 2316  . sodium chloride flush (NS) 0.9 % injection 3 mL  3 mL Intravenous PRN Saundra Shelling, MD   3 mL at 01/11/18 0924  . tamsulosin (FLOMAX) capsule 0.4 mg  0.4 mg Oral QODAY Pyreddy, Reatha Harps, MD   0.4 mg at 01/13/18 0949    VITAL SIGNS: BP 130/79 (BP Location: Left Arm)   Pulse 100   Temp 97.6 F (36.4 C) (Oral)   Resp 16   Ht 5\' 11"  (1.803 m)   Wt 184 lb 11.9 oz (83.8 kg)   SpO2 96%   BMI 25.77 kg/m  Filed Weights   01/11/18 0038  Weight: 184 lb 11.9 oz (83.8 kg)    Estimated body mass index is 25.77 kg/m as calculated from the following:   Height as of this encounter: 5\' 11"  (1.803 m).   Weight as of this encounter: 184 lb 11.9 oz (83.8 kg).  LABS: CBC:    Component Value Date/Time   WBC 8.9 01/11/2018 0047   HGB 8.7 (L) 01/11/2018 0047   HGB 8.2 (L) 04/28/2012 1503   HCT 28.4 (L) 01/11/2018 0047   HCT 26.1 (L) 04/28/2012 1503   PLT 162 01/11/2018 0047   PLT 357 04/28/2012 1503   MCV 80.0 01/11/2018 0047   MCV 69 (L) 04/28/2012 1503   NEUTROABS 11.0 (H) 01/10/2018 1930   NEUTROABS 4.1 04/28/2012 1503   LYMPHSABS 0.4 (L) 01/10/2018 1930   LYMPHSABS 0.7 (L) 04/28/2012 1503   MONOABS 0.4 01/10/2018 1930   MONOABS 0.6 04/28/2012 1503   EOSABS 0.0 01/10/2018 1930   EOSABS 0.1 04/28/2012 1503   BASOSABS 0.0 01/10/2018 1930   BASOSABS 0.0 04/28/2012 1503   Comprehensive Metabolic Panel:    Component Value Date/Time   NA 137 01/13/2018 0438   NA 121 (L) 09/24/2017 1458   NA 135 (L) 04/28/2012 1503   K 4.4 01/13/2018 0438     K 5.0 04/28/2012 1503   CL 106 01/13/2018 0438   CL 101 04/28/2012 1503   CO2 21 (L) 01/13/2018 0438   CO2 26 04/28/2012 1503   BUN 54 (H) 01/13/2018 0438   BUN 37 (H) 09/24/2017 1458   BUN 13 04/28/2012 1503   CREATININE 1.60 (H) 01/13/2018 0438   CREATININE 1.49 (H) 04/28/2012 1503   GLUCOSE 382 (H) 01/13/2018 0438   GLUCOSE 239 (H) 04/28/2012 1503   CALCIUM 7.8 (L) 01/13/2018 0438   CALCIUM 8.2 (L) 02/19/2017 1522   AST 46 (H) 01/10/2018 1930   AST 25 04/28/2012 1503   ALT 21 01/10/2018 1930   ALT 18 04/28/2012 1503   ALKPHOS 403 (H) 01/10/2018 1930   ALKPHOS 181 (H) 04/28/2012 1503   BILITOT 1.6 (H) 01/13/2018 1517   BILITOT 3.9 (H) 09/23/2017 1336   BILITOT 0.8 04/28/2012 1503   PROT 8.1 01/10/2018 1930   PROT 5.6 (L) 09/23/2017 1336   PROT 8.4 (H) 04/28/2012 1503   ALBUMIN 2.3 (L) 01/10/2018 1930   ALBUMIN 2.8 (L) 09/23/2017 1336   ALBUMIN 3.1 (L) 04/28/2012 1503    RADIOGRAPHIC STUDIES: Nm Pet Image Restag (ps) Skull Base To Thigh  Result Date: 12/17/2017 CLINICAL DATA:  Subsequent treatment strategy for metastatic renal cell carcinoma. EXAM: NUCLEAR MEDICINE PET SKULL BASE TO THIGH TECHNIQUE: 8.5 mCi F-18 FDG was injected intravenously. Full-ring PET imaging was performed from the skull base to thigh after the radiotracer. CT data was obtained and used for attenuation correction and anatomic localization. Fasting blood glucose: 113 mg/dl COMPARISON:  Multiple exams, including PET-CT from 10/15/2016 and CT scans from 09/20/2017 and 06/07/2017 FINDINGS: Mediastinal blood pool activity: SUV max 1.2 NECK: No significant abnormal hypermetabolic activity in this region. Incidental CT findings: Atherosclerotic calcification of the carotid bulbs. CHEST: 2.7 by 2.2 cm left lower lobe pulmonary nodule has a maximum SUV of 1.8 (formerly 2.4 on 10/15/2016) and previously measured 2.7 by 2.4 cm on 09/20/2017. Right eccentric subcarinal lymph node measures 2.5 cm in short axis on  image 93/3 (formerly 2.4 cm by my measurements on 06/07/2017) and has a maximum SUV of 3.0 (formerly 2.7). Indistinctly marginated confluent left hilar adenopathy has a maximum SUV of 2.0 (formerly 2.6). A right internal mammary lymph node measuring 1.0 cm in short axis on image 85/3 (formerly 0.5 cm on 06/07/2017) has a maximum SUV of 2.7 (formerly not hypermetabolic). The scattered bilateral pulmonary nodule 7 minimal faint activity and are similar to those shown on 06/07/2017. Similarly there is very low-grade activity associated with the right hilar adenopathy. Small left and trace right pleural effusions are significantly increased from 09/20/2017, but nonspecific for transudative versus exudative etiology. Incidental CT findings: Coronary, aortic arch, and branch vessel atherosclerotic vascular disease. Cachexia. Diffuse subcutaneous edema. ABDOMEN/PELVIS: Prominent ascites with marginal accentuated activity favoring malignant ascites. Low-grade activity in infiltrative mesenteric and omental tissues. While much of this may be from extensive third spacing of fluid, I can not exclude malignant ascites. A representative section of the left upper quadrant omentum has a maximum SUV of 2.6. Fluid and thickening extend through a left indirect inguinal hernia into the left groin and scrotum, with a maximum SUV of 2.7. There is also a separate notable left hydrocele. Incidental CT findings: Cholelithiasis. Aortoiliac atherosclerotic vascular disease. Prominent splenomegaly. Left nephrectomy. No dilated bowel. Diffuse subcutaneous edema. SKELETON: No significant abnormal hypermetabolic activity in this region. Incidental CT findings: IMPRESSION: 1. In the chest, some areas of malignancy including the dominant left lower lobe pulmonary nodule and the confluent left hilar adenopathy are mildly reduced in maximum SUV compared to the prior exam, and still have low-grade activity. However, other areas such as the right  eccentric subcarinal lymph node and new right internal mammary adenopathy are increased in metabolic activity. 2. Progressive diffuse ascites and diffuse mesenteric and omental edema. There is a thick and hypermetabolic rind associated with the left inguinal hydrocele. There is also some low-grade activity along peritoneal margins and in the omentum. Given the generally low-grade nature of the tumor to start with, I can not exclude peritoneal spread of malignancy. Correlate with any pathology from the paracenteses. If not already performed it may be prudent to send paracentesis fluid for pathology analysis. 3. Cachexia. 4.  Aortic Atherosclerosis (ICD10-I70.0).  Coronary atherosclerosis. 5. Cholelithiasis. 6. Prominent splenomegaly. Electronically Signed   By: Van Clines M.D.   On: 12/17/2017 15:57   US Paracentesis  Result Date: 01/13/2018 INDICATION: Cirrhosis and ascites. EXAM: ULTRASOUND GUIDED PARACENTESIS MEDICATIONS: None. COMPLICATIONS: None immediate. PROCEDURE: Informed written consent was obtained from the patient after a discussion of the risks, benefits and alternatives to treatment. A timeout was performed prior to the initiation of the procedure. Initial ultrasound was performed to localize ascites. The right lower abdomen was prepped and draped in the usual sterile fashion. 1% lidocaine was used for local anesthesia. Following this, a 6 Fr Safe-T-Centesis catheter was introduced. An ultrasound image was saved for documentation purposes. The paracentesis was performed. The catheter was removed and a dressing was applied. The patient tolerated the procedure  well without immediate post procedural complication. FINDINGS: A total of approximately 6 L of yellow fluid was removed. Samples were sent to the laboratory as requested by the clinical team. IMPRESSION: Successful ultrasound-guided paracentesis yielding 6 liters of peritoneal fluid. Electronically Signed   By: Aletta Edouard M.D.   On:  01/13/2018 14:37   US Paracentesis  Result Date: 12/27/2017 INDICATION: 63 year old male with alcoholic cirrhosis and recurrent large volume ascites. He presents for scheduled therapeutic large volume paracentesis. EXAM: ULTRASOUND GUIDED  PARACENTESIS MEDICATIONS: None. COMPLICATIONS: None immediate. PROCEDURE: Informed written consent was obtained from the patient after a discussion of the risks, benefits and alternatives to treatment. A timeout was performed prior to the initiation of the procedure. Initial ultrasound scanning demonstrates a large amount of ascites within the right lower abdominal quadrant. The right lower abdomen was prepped and draped in the usual sterile fashion. 1% lidocaine with epinephrine was used for local anesthesia. Following this, a 6 Fr Safe-T-Centesis catheter was introduced. An ultrasound image was saved for documentation purposes. The paracentesis was performed. The catheter was removed and a dressing was applied. The patient tolerated the procedure well without immediate post procedural complication. FINDINGS: A total of approximately 5300 mL of yellow ascitic fluid was removed. IMPRESSION: Successful ultrasound-guided paracentesis yielding 5.3 liters of peritoneal fluid. Electronically Signed   By: Jacqulynn Cadet M.D.   On: 12/27/2017 14:20   Dg Chest Port 1 View  Result Date: 01/11/2018 CLINICAL DATA:  Acute respiratory failure. EXAM: PORTABLE CHEST 1 VIEW COMPARISON:  One-view chest x-ray 01/10/2017 FINDINGS: Heart size is normal. Aortic atherosclerosis is again seen. Left basilar airspace disease is similar the prior study. Left effusion is suspected. Minimal atelectasis is present at the right base. IMPRESSION: 1. Similar appearance of left basilar airspace disease, indicating pneumonia. 2. Probable small left pleural effusion. Electronically Signed   By: San Morelle M.D.   On: 01/11/2018 08:13   Dg Chest Port 1 View  Result Date: 01/10/2018 CLINICAL  DATA:  Chills and nausea. Previous history of renal cell carcinoma metastatic to lung. EXAM: PORTABLE CHEST 1 VIEW COMPARISON:  12/12/2017 FINDINGS: Shallow inspiration. Heart size and pulmonary vascularity are normal for technique. Suggestion of infiltration or atelectasis in the left lung base. Probable small left pleural effusion as seen previously. Could consider pneumonia in the appropriate clinical setting. No pneumothorax. Calcification of the aorta. Degenerative changes in the shoulders. IMPRESSION: Infiltration or atelectasis in the left lung base with small left pleural effusion. Consider pneumonia in the appropriate clinical setting. Electronically Signed   By: Lucienne Capers M.D.   On: 01/10/2018 20:00    PERFORMANCE STATUS (ECOG) : 3 - Symptomatic, >50% confined to bed  Review of Systems As noted above. Otherwise, a complete review of systems is negative.  Physical Exam General: severely thin, ill appearing Cardiovascular: regular rate and rhythm Pulmonary: clear ant fields Abdomen: distended, nttp, BS + GU: no suprapubic tenderness Extremities: edema Skin: no rashes Neurological: lethargic, answers questions with single word answers  IMPRESSION: Patient is known to me from the clinic. He is currently receiving second line nivolumab and has been tolerating treatment. However, is performance status has been declining over past months due to end stage cirrhosis.   Patient is currently lethargic. He wakes when stimulated but only provides single word answers to questions. His ex-father-in-law is at bedside but said that he was just relieving his granddaughter who is more involved in patient's care. Patient's daughters reportedly will be at the hospital tomorrow  AM and I will try to meet/speak with them to clarify goals.   Case discussed with Dr. Grayland Ormond.   PLAN: Continue current scope of treatment Will meet with family to clarify goals DNR/MOST form previously completed in  the clinic   Time Total: 30 minutes  Visit consisted of counseling and education dealing with the complex and emotionally intense issues of symptom management and palliative care in the setting of serious and potentially life-threatening illness.Greater than 50%  of this time was spent counseling and coordinating care related to the above assessment and plan.  Signed by: Altha Harm, PhD, NP-C [phone] (Work Cell)

## 2018-01-13 NOTE — Progress Notes (Signed)
[place] [facility] [place] NAME: Rodney Stewart    MR#:  [id number]  DATE OF BIRTH:  [birth year]  Patient feels slightly better less abdominal pain, less back pain  CHIEF COMPLAINT:   Chief Complaint  Patient presents with  . Chills  . Nausea    REVIEW OF SYSTEMS:   ROS CONSTITUTIONAL: No fever, is very poor p.o. intake..  Patient appears very cachectic. EYES: No blurred or double vision.  EARS, NOSE, AND THROAT: No tinnitus or ear pain.  RESPIRATORY: No cough, shortness of breath, wheezing or hemoptysis.  CARDIOVASCULAR: No chest pain, orthopnea, edema.  GASTROINTESTINA symptoms; distended with abdominal pain, ascites.  And has umbilical hernia.   ,abdominal pain GENITOURINARY: No dysuria, hematuria.  ENDOCRINE: No polyuria, nocturia,  HEMATOLOGY: Anemia, thrombocytopenia due to liver cirrhosis SKIN: No rash or lesion. MUSCULOSKELETAL: No joint pain or arthritis.   NEUROLOGIC: No tingling, numbness, weakness.  PSYCHIATRY: No anxiety or depression.   DRUG ALLERGIES:  No Known Allergies  VITALS:  Blood pressure 140/88, pulse 93, temperature 97.6 F (36.4 C), temperature source Oral, resp. rate 18, height 5\' 11"  (1.803 m), weight 83.8 kg, SpO2 95 %.  PHYSICAL EXAMINATION:  GENERAL:  63 y.o.-year-old patient lying in the bed with no acute distress.  EYES: Pupils equal, round, reactive to light and accommodation. No scleral icterus. Extraocular muscles intact.  HEENT: Head atraumatic, normocephalic. Oropharynx and nasopharynx clear.  NECK:  Supple, no jugular venous distention. No thyroid enlargement, no tenderness.  LUNGS diminished air entry bilaterally cARDIOVASCULAR: S1, S2 normal. No murmurs, rubs, or gallops.  ABDOMEN: Distended, fluid thrill present. EXTREMITIES: No pedal edema, cyanosis, or clubbing.  NEUROLOGIC: Cranial nerves II through XII are intact. Muscle strength 5/5 in all extremities. Sensation intact. Gait not  checked.  PSYCHIATRIC: The patient is alert and oriented x 3.  SKIN: No obvious rash, lesion, or ulcer.    LABORATORY PANEL:   CBC Recent Labs  Lab 01/11/18 0047  WBC 8.9  HGB 8.7*  HCT 28.4*  PLT 162   ------------------------------------------------------------------------------------------------------------------  Chemistries  Recent Labs  Lab 01/10/18 1930  01/12/18 1424 01/13/18 0438  NA 136   < >  --  137  K 3.7   < > 4.7 4.4  CL 98   < >  --  106  CO2 25   < >  --  21*  GLUCOSE 85   < >  --  382*  BUN 26*   < >  --  54*  CREATININE 1.29*   < >  --  1.60*  CALCIUM 8.2*   < >  --  7.8*  MG  --    < > 2.2  --   AST 46*  --   --   --   ALT 21  --   --   --   ALKPHOS 403*  --   --   --   BILITOT 2.6*  --   --   --    < > = values in this interval not displayed.   ------------------------------------------------------------------------------------------------------------------  Cardiac Enzymes Recent Labs  Lab 01/10/18 1930  TROPONINI <0.03   ------------------------------------------------------------------------------------------------------------------  RADIOLOGY:  No results found.  EKG:   Orders placed or performed during the hospital encounter of 01/10/18  . EKG 12-Lead  . EKG 12-Lead    ASSESSMENT AND PLAN:  25 63-year-old male patient with history of liver cirrhosis, clear-cell renal cancer comes in because of generalized weakness,  body aches and found to have hypothermia, admitted for septic shock, pneumonia in left lung, admitted to ICU.  #1 .sepsis present on admission secondary to pneumonia: Improving, off the BiPAP, continue nasal cannula as needed, continue empiric antibiotics. 2.  Renal cell CA with lung mets, getting chemotherapy 3.  History of liver cirrhosis, ascites now has abdominal pain, rule out SBP, patient needs sono-guided paracentesis, continue IV antibiotics 4.  Diabetes mellitus type 2; stable.  Patient p.o. intake is better,  continue Levemir, NovoLog. 5.  Liver cirrhosis with auto-anticoagulation, INR 1.47.,  Need to rule out SBP, continue empiric antibiotics, needs paracentesis, ordered, 6.  Severe malnutrition secondary to cancer, will liberalize diet today 7.  Nausea, vomiting use Phenergan, Zofran, add IV PPI. Intake is better.. 8.  Acute on chronic abdominal pain, Better with fentanyl patch    Prognosis poor,  Spoke with patient's daughter today. All the records are reviewed and case discussed with Care Management/Social Workerr. Management plans discussed with the patient, family and they are in agreement.  CODE STATUS; DNR.  TOTAL TIME TAKING CARE OF THIS PATIENT: 38 minutes.   POSSIBLE D/C IN 1-2 DAYS, DEPENDING ON CLINICAL CONDITION.   Epifanio Lesches M.D on 01/13/2018 at 1:17 PM  Between 7am to 6pm - Pager - [phone]  After 6pm go to [email or web address] - password [email or web address] [facility]  Office  [phone]  CC: Primary care physician; [name], [name], MD   Note: This dictation was prepared with Dragon dictation along with smaller phrase technology. Any transcriptional errors that result from this process are unintentional.

## 2018-01-13 NOTE — Progress Notes (Signed)
Pastoral Care Visit    01/13/18 0425  Clinical Encounter Type  Visited With Patient and family together  Visit Type Critical Care  Referral From Chaplain  Consult/Referral To Chaplain  Spiritual Encounters  Spiritual Needs Emotional   Visited pt for emotional support.  Pt was attended by his brother in law, Alveta Heimlich.  Pt could not keep eyes open and was struggling to drink water, though thirsty.  He was lamenting his declining physical condition.  Chaplain provided empathy and emotional support.  Darcey Nora, Chaplain

## 2018-01-14 ENCOUNTER — Inpatient Hospital Stay: Payer: Medicare HMO

## 2018-01-14 LAB — GLUCOSE, CAPILLARY
Glucose-Capillary: >600 mg/dL (ref 70–99)
Glucose-Capillary: >600 mg/dL (ref 70–99)
Glucose-Capillary: >600 mg/dL (ref 70–99)

## 2018-01-14 LAB — URINALYSIS, COMPLETE (UACMP) WITH MICROSCOPIC
Bacteria, UA: NONE SEEN
Bilirubin Urine: NEGATIVE
Glucose, UA: >=500 mg/dL — AB
Hgb urine dipstick: NEGATIVE
Ketones, ur: 5 mg/dL — AB
Leukocytes, UA: NEGATIVE
Nitrite: NEGATIVE
Protein, ur: NEGATIVE mg/dL
SPECIFIC GRAVITY, URINE: 1.01 (ref 1.005–1.030)
pH: 5 (ref 5.0–8.0)

## 2018-01-14 LAB — BASIC METABOLIC PANEL
Anion gap: 13 (ref 5–15)
BUN: 63 mg/dL — ABNORMAL HIGH (ref 8–23)
CO2: 17 mmol/L — ABNORMAL LOW (ref 22–32)
Calcium: 7.7 mg/dL — ABNORMAL LOW (ref 8.9–10.3)
Chloride: 97 mmol/L — ABNORMAL LOW (ref 98–111)
Creatinine, Ser: 2.12 mg/dL — ABNORMAL HIGH (ref 0.61–1.24)
GFR calc Af Amer: 38 mL/min — ABNORMAL LOW (ref >60)
GFR calc non Af Amer: 33 mL/min — ABNORMAL LOW (ref >60)
Glucose, Bld: 861 mg/dL (ref 70–99)
Potassium: 4 mmol/L (ref 3.5–5.1)
Sodium: 127 mmol/L — ABNORMAL LOW (ref 135–145)

## 2018-01-14 LAB — TRIGLYCERIDES, BODY FLUIDS: Triglycerides, Fluid: <9 mg/dL

## 2018-01-14 LAB — PROTEIN, BODY FLUID (OTHER): Total Protein, Body Fluid Other: 0.7 g/dL

## 2018-01-14 LAB — PATHOLOGIST SMEAR REVIEW

## 2018-01-14 MED ORDER — SODIUM CHLORIDE 0.9 % IV SOLN
INTRAVENOUS | Status: AC
  Administered 2018-01-14: 22:00:00 via INTRAVENOUS

## 2018-01-14 MED ORDER — PANTOPRAZOLE SODIUM 40 MG PO TBEC
40.0000 mg | DELAYED_RELEASE_TABLET | Freq: Two times a day (BID) | ORAL | Status: DC
  Administered 2018-01-15 – 2018-01-21 (×11): 40 mg via ORAL
  Filled 2018-01-14 (×11): qty 1

## 2018-01-14 MED ORDER — INSULIN ASPART 100 UNIT/ML ~~LOC~~ SOLN
12.0000 [IU] | Freq: Once | SUBCUTANEOUS | Status: AC
  Administered 2018-01-14: 18:00:00 12 [IU] via SUBCUTANEOUS

## 2018-01-14 MED ORDER — INSULIN ASPART 100 UNIT/ML ~~LOC~~ SOLN
12.0000 [IU] | Freq: Once | SUBCUTANEOUS | Status: AC
  Administered 2018-01-14: 12 [IU] via SUBCUTANEOUS
  Filled 2018-01-14: qty 1

## 2018-01-14 MED ORDER — OXYCODONE HCL 5 MG PO TABS
5.0000 mg | ORAL_TABLET | ORAL | Status: DC | PRN
  Administered 2018-01-14 – 2018-01-20 (×4): 5 mg via ORAL
  Filled 2018-01-14 (×4): qty 1

## 2018-01-14 MED ORDER — INSULIN ASPART 100 UNIT/ML ~~LOC~~ SOLN: 0.0000 [IU] | Freq: Three times a day (TID) | SUBCUTANEOUS | Status: DC

## 2018-01-14 MED ORDER — FUROSEMIDE 40 MG PO TABS: 40.0000 mg | ORAL_TABLET | Freq: Two times a day (BID) | ORAL | Status: DC

## 2018-01-14 MED ORDER — GABAPENTIN 100 MG PO CAPS
200.0000 mg | ORAL_CAPSULE | Freq: Two times a day (BID) | ORAL | Status: DC
  Administered 2018-01-14 – 2018-01-21 (×13): 200 mg via ORAL
  Filled 2018-01-14 (×15): qty 2

## 2018-01-14 MED ORDER — PROMETHAZINE HCL 25 MG/ML IJ SOLN: 6.2500 mg | Freq: Four times a day (QID) | INTRAMUSCULAR | Status: DC | PRN

## 2018-01-14 MED ORDER — INSULIN DETEMIR 100 UNIT/ML ~~LOC~~ SOLN
9.0000 [IU] | Freq: Two times a day (BID) | SUBCUTANEOUS | Status: DC
  Filled 2018-01-14 (×2): qty 0.09

## 2018-01-14 MED ORDER — INSULIN DETEMIR 100 UNIT/ML ~~LOC~~ SOLN
20.0000 [IU] | Freq: Two times a day (BID) | SUBCUTANEOUS | Status: DC
  Filled 2018-01-14 (×2): qty 0.2

## 2018-01-14 MED ORDER — INSULIN REGULAR HUMAN 100 UNIT/ML IJ SOLN
20.0000 [IU] | Freq: Once | INTRAMUSCULAR | Status: AC
  Administered 2018-01-15: 01:00:00 20 [IU] via INTRAVENOUS
  Filled 2018-01-14: qty 10

## 2018-01-14 MED ORDER — INSULIN ASPART 100 UNIT/ML ~~LOC~~ SOLN: 0.0000 [IU] | Freq: Every day | SUBCUTANEOUS | Status: DC

## 2018-01-14 MED ORDER — INSULIN REGULAR HUMAN 100 UNIT/ML IJ SOLN
10.0000 [IU] | Freq: Once | INTRAMUSCULAR | Status: AC
  Administered 2018-01-14: 22:00:00 10 [IU] via INTRAVENOUS
  Filled 2018-01-14: qty 10

## 2018-01-14 MED ORDER — LEVOTHYROXINE SODIUM 75 MCG PO TABS
75.0000 ug | ORAL_TABLET | Freq: Every day | ORAL | Status: DC
  Administered 2018-01-14 – 2018-01-21 (×7): 75 ug via ORAL
  Filled 2018-01-14 (×2): qty 1
  Filled 2018-01-14 (×2): qty 3
  Filled 2018-01-14 (×4): qty 1
  Filled 2018-01-14 (×2): qty 3
  Filled 2018-01-14 (×2): qty 1

## 2018-01-14 MED ORDER — FUROSEMIDE 20 MG PO TABS
20.0000 mg | ORAL_TABLET | Freq: Two times a day (BID) | ORAL | Status: DC
  Administered 2018-01-14 – 2018-01-15 (×2): 20 mg via ORAL
  Filled 2018-01-14 (×2): qty 1

## 2018-01-14 MED ORDER — INSULIN DETEMIR 100 UNIT/ML ~~LOC~~ SOLN
20.0000 [IU] | Freq: Two times a day (BID) | SUBCUTANEOUS | Status: DC
  Administered 2018-01-14: 20 [IU] via SUBCUTANEOUS
  Filled 2018-01-14 (×3): qty 0.2

## 2018-01-14 NOTE — Progress Notes (Signed)
CRITICAL VALUE ALERT  Critical Value:  Glucose 861  Date & Time Notied:  01/14/2018  Provider Notified: Claria Dice, MD on call  Orders Received/Actions taken: MD notified, stated will review and input orders.

## 2018-01-14 NOTE — Progress Notes (Signed)
Notified Dr. Vianne Bulls of pt CBG >600, verbal order to give 12 units of Novolog

## 2018-01-14 NOTE — Progress Notes (Signed)
Recheck CBG at 1854, >600, notified on call MD Dr. Tressia Miners, new verbal orders received. Reported to on coming nurse

## 2018-01-14 NOTE — Progress Notes (Signed)
Blood sugar by finger stick>500 twice- stat blood draw showing blood glucose at 861, normal anion gap Patient diabetic, not on steroids - stat IV insulin push- adjusted levemir to home dosage and A1c ordered (last a1c at 8.9) - recheck finger stick in 1 hr, BMP in 4 hrs- orders placed If sugars still elevated- will consider non DKA IV insulin drip for hyperglycemia

## 2018-01-14 NOTE — Progress Notes (Signed)
PT Cancellation Note  Patient Details Name: Rodney Stewart MRN: [id number] DOB: 1955-11-02   Cancelled Treatment:    Reason Eval/Treat Not Completed: Fatigue/lethargy limiting ability to participate.  Attempting to eat very slowly, and daughter is present to provide PLOF as pt is not able to give accurate details.  Retry at another time.   Ramond Dial 01/14/2018, 1:47 PM   Mee Hives, PT [facility] Acute Rehab Dept. Number: [place] [facility] [place]

## 2018-01-14 NOTE — Evaluation (Signed)
Physical Therapy Evaluation Patient Details Name: Rodney Stewart MRN: [id number] DOB: 10-Mar-1955 Today's Date: 01/14/2018   History of Present Illness  63 y.o. male with onset of sepsis PNA, hypothermia from sepsis, requiring paracentesis on 1/13 with removal of 6L fluid.  PMHx:  ascites, DM, CKD, renal cell CA, gout, nephrectomy, HTN, cirrhosis, hyponatremia, low K+,   Clinical Impression  Pt was seen for mobility and was reluctant but finally was assisted to stand up for 15 seconds.  He is too weak to handle standing on a walker so will focus on mobility with sliding on the bed, gait on any device and control of standing.  Follow acutely for progression of all balance and strength routines, increase gait as pt is able.    Follow Up Recommendations SNF    Equipment Recommendations  None recommended by PT    Recommendations for Other Services       Precautions / Restrictions Precautions Precautions: Fall;Other (comment)(telemetry) Precaution Comments: monitor  Restrictions Weight Bearing Restrictions: No      Mobility  Bed Mobility Overal bed mobility: Needs Assistance Bed Mobility: Supine to Sit;Sit to Supine     Supine to sit: Mod assist Sit to supine: Mod assist   General bed mobility comments: mod assist to lift trunk OOB and mod for legs back to bed  Transfers Overall transfer level: Needs assistance Equipment used: 1 person hand held assist Transfers: Sit to/from Stand Sit to Stand: Max assist;From elevated surface         General transfer comment: needs hands on assist to stand as he is unable to be cued to use RW  Ambulation/Gait             General Gait Details: unable to take a step  Stairs            Wheelchair Mobility    Modified Rankin (Stroke Patients Only)       Balance Overall balance assessment: Needs assistance Sitting-balance support: Bilateral upper extremity supported;Feet supported Sitting balance-Leahy Scale: Fair      Standing balance support: Bilateral upper extremity supported;During functional activity Standing balance-Leahy Scale: Poor                               Pertinent Vitals/Pain Pain Assessment: No/denies pain    Home Living Family/patient expects to be discharged to:: Private residence Living Arrangements: Alone Available Help at Discharge: Family;Available PRN/intermittently Type of Home: House Home Access: Stairs to enter Entrance Stairs-Rails: None Entrance Stairs-Number of Steps: 2 Home Layout: Two level;Bed/bath upstairs        Prior Function Level of Independence: Independent         Comments: did not use an AD     Hand Dominance        Extremity/Trunk Assessment   Upper Extremity Assessment Upper Extremity Assessment: Overall WFL for tasks assessed    Lower Extremity Assessment Lower Extremity Assessment: Overall WFL for tasks assessed    Cervical / Trunk Assessment Cervical / Trunk Assessment: Kyphotic  Communication   Communication: No difficulties  Cognition Arousal/Alertness: Lethargic Behavior During Therapy: Flat affect Overall Cognitive Status: Impaired/Different from baseline Area of Impairment: Problem solving;Awareness;Safety/judgement;Following commands;Memory                     Memory: Decreased recall of precautions;Decreased short-term memory Following Commands: Follows one step commands with increased time Safety/Judgement: Decreased awareness of safety;Decreased awareness of deficits Awareness:  Intellectual Problem Solving: Slow processing;Decreased initiation;Difficulty sequencing;Requires verbal cues;Requires tactile cues        General Comments General comments (skin integrity, edema, etc.): after assisting to stand pt could support on legs and stand mod assist    Exercises Other Exercises Other Exercises: LE strength is 4- DF and hamstrings   Assessment/Plan    PT Assessment Patient needs continued  PT services  PT Problem List Decreased strength;Decreased range of motion;Decreased activity tolerance;Decreased balance;Decreased mobility;Decreased coordination;Decreased knowledge of use of DME;Decreased safety awareness;Decreased knowledge of precautions;Cardiopulmonary status limiting activity;Decreased skin integrity       PT Treatment Interventions DME instruction;Gait training;Stair training;Therapeutic activities;Functional mobility training;Therapeutic exercise;Balance training;Neuromuscular re-education;Patient/family education    PT Goals (Current goals can be found in the Care Plan section)  Acute Rehab PT Goals Patient Stated Goal: none stated, did not want to get OOB initially PT Goal Formulation: With patient/family Time For Goal Achievement: 01/27/2018 Potential to Achieve Goals: Fair    Frequency Min 2X/week   Barriers to discharge Inaccessible home environment;Decreased caregiver support stairs and is home alone    Co-evaluation               AM-PAC PT "6 Clicks" Mobility  Outcome Measure Help needed turning from your back to your side while in a flat bed without using bedrails?: A Little Help needed moving from lying on your back to sitting on the side of a flat bed without using bedrails?: A Lot Help needed moving to and from a bed to a chair (including a wheelchair)?: A Lot Help needed standing up from a chair using your arms (e.g., wheelchair or bedside chair)?: A Lot Help needed to walk in hospital room?: Total Help needed climbing 3-5 steps with a railing? : Total 6 Click Score: 11    End of Session Equipment Utilized During Treatment: Gait belt Activity Tolerance: Patient limited by fatigue;Treatment limited secondary to medical complications (Comment) Patient left: in bed;with call bell/phone within reach;with bed alarm set;with family/visitor present Nurse Communication: Mobility status;Weight bearing status PT Visit Diagnosis: Unsteadiness on feet  (R26.81);Muscle weakness (generalized) (M62.81);Difficulty in walking, not elsewhere classified (R26.2);Adult, failure to thrive (R62.7)    Time: 4650-3546 PT Time Calculation (min) (ACUTE ONLY): 19 min   Charges:   PT Evaluation $PT Eval Moderate Complexity: 1 Mod         Ramond Dial 01/14/2018, 8:07 PM  Mee Hives, PT [facility] Acute Rehab Dept. Number: [place] and [place]

## 2018-01-14 NOTE — Progress Notes (Signed)
CRITICAL VALUE ALERT  Critical Value:  CBG above 600  Date & Time Notied:  11:15  Provider Notified: D. Jannifer Franklin, MD  Orders Received/Actions taken: Insulin ordered, waiting to be verified via pharmacy.

## 2018-01-14 NOTE — Progress Notes (Signed)
   01/14/18 1250  Clinical Encounter Type  Visited With Patient and family together  Visit Type Other (Comment) (Advance Directive)  Referral From Nurse  Consult/Referral To Chaplain  Pt's daughter will bring an advance directive from home around 5. If it is not an advance directive, Ch will proceed to help them with the process.

## 2018-01-14 NOTE — Progress Notes (Signed)
[place]  [phone] [phone]   Name: Rodney Stewart Date: 01/14/2018 MRN: [id number]  DOB: [birth year]  Patient Care Team: [name], [name], MD as PCP - General (Internal Medicine)    REASON FOR CONSULTATION: Palliative Care consult requested for this62 y.o.malewith multiple medical problems including stage IV renal cell carcinoma status post left nephrectomy (May 2014) metastatic to lung, advanced decompensated cirrhosis with recurrent ascites, and CKD 3. Patient's RCC is being managed with second line nivolumab. He was hospitalized 10/18/2017 to 10/20/2017 with hepatorenal syndrome.He is now admitted 01/10/18 with sepsis from PNA. He initially required BIPAP due to respiratory failure.Palliative care was consulted to help establish goals of care.  CODE STATUS: DNR  PAST MEDICAL HISTORY: Past Medical History:  Diagnosis Date  . Anemia   . Ascites   . BPH (benign prostatic hyperplasia)   . Cirrhosis ([place])   . CKD (chronic kidney disease) stage 3, GFR 30-59 ml/min (HCC)   . Clear cell renal cell carcinoma ([place]) 2014   Left Nephrectomy.  . Colon polyps   . Diabetes mellitus without complication ([place])    type 2   . Dyspnea    with exertion  . GERD (gastroesophageal reflux disease)   . History of gout   . History of nephrectomy    Left  . Hypertension   . Neuropathy   . Renal Cancer    Renal Cancer  . Renal cell carcinoma of left kidney (HCC)    mets to lungs  . Umbilical hernia     PAST SURGICAL HISTORY:  Past Surgical History:  Procedure Laterality Date  . BACK SURGERY     ruptured disc 1991   . ENDOBRONCHIAL ULTRASOUND N/A 10/14/2015   Procedure: ENDOBRONCHIAL ULTRASOUND;  Surgeon: [name], MD;  Location: [facility] ORS;  Service: Pulmonary;  Laterality: N/A;  . ESOPHAGOGASTRODUODENOSCOPY (EGD) WITH PROPOFOL N/A 04/15/2017   Procedure: ESOPHAGOGASTRODUODENOSCOPY (EGD) WITH  PROPOFOL;  Surgeon: Jonathon Bellows, MD;  Location: [facility] ENDOSCOPY;  Service: Gastroenterology;  Laterality: N/A;  Screen for esophageal varices  . EYE SURGERY Bilateral    Cataract Extraction with IOL  . IR RADIOLOGIST EVAL & MGMT  10/15/2017  . KNEE SURGERY Right   . NEPHRECTOMY     left kidney 2014 renal cell cancer     HEMATOLOGY/ONCOLOGY HISTORY:  Oncology History   Patient underwent left nephrectomy on May 16, 2012 which revealed a clear cell grade 2 renal cell carcinoma, stage TIIIa, N0, M0. Tumor size of 16 cm. Patient was noted to have renal vein involvement, but no other structures were involved. 0 of 2 lymph nodes were negative for disease. PET scan on October 20, 2015 revealed metastatic disease and patient was initiated on Votrient. This was subsequently discontinued in March 2018 secondary to progression of disease. Patient initiated second line treatment with nivolumab on Monday, April 02, 2016     Metastatic renal cell carcinoma to lung [facility])    Initial Diagnosis    Metastatic renal cell carcinoma to lung ([place])     ALLERGIES:  has No Known Allergies.  MEDICATIONS:  Current Facility-Administered Medications  Medication Dose Route Frequency Provider Last Rate Last Dose  . 0.9 %  sodium chloride infusion  250 mL Intravenous PRN Saundra Shelling, MD 10 mL/hr at 01/14/18 1455 250 mL at 01/14/18 1455  . acetaminophen (TYLENOL) tablet 650 mg  650 mg Oral Q6H PRN Saundra Shelling, MD       Or  .  acetaminophen (TYLENOL) suppository 650 mg  650 mg Rectal Q6H PRN Saundra Shelling, MD      . allopurinol (ZYLOPRIM) tablet 100 mg  100 mg Oral Daily Pyreddy, Pavan, MD   100 mg at 01/14/18 1053  . aspirin EC tablet 81 mg  81 mg Oral Daily Pyreddy, Reatha Harps, MD   81 mg at 01/14/18 1054  . cefTRIAXone (ROCEPHIN) 2 g in sodium chloride 0.9 % 100 mL IVPB  2 g Intravenous Q24H Epifanio Lesches, MD 200 mL/hr at 01/14/18 1459 2 g at 01/14/18 1459  . diphenhydrAMINE (BENADRYL) capsule 25 mg  25 mg Oral  QHS PRN Lance Coon, MD   25 mg at 01/12/18 2324  . docusate sodium (COLACE) capsule 100 mg  100 mg Oral BID PRN Pyreddy, Reatha Harps, MD      . feeding supplement (ENSURE ENLIVE) (ENSURE ENLIVE) liquid 237 mL  237 mL Oral TID BM Pyreddy, Pavan, MD   237 mL at 01/14/18 1451  . furosemide (LASIX) tablet 40 mg  40 mg Oral BID Epifanio Lesches, MD      . gabapentin (NEURONTIN) capsule 200 mg  200 mg Oral BID Epifanio Lesches, MD      . HYDROmorphone (DILAUDID) injection 0.5 mg  0.5 mg Intravenous Q4H PRN Lance Coon, MD   0.5 mg at 01/14/18 0827  . insulin aspart (novoLOG) injection 0-5 Units  0-5 Units Subcutaneous QHS Epifanio Lesches, MD      . insulin aspart (novoLOG) injection 0-9 Units  0-9 Units Subcutaneous TID WC Epifanio Lesches, MD      . insulin detemir (LEVEMIR) injection 9 Units  9 Units Subcutaneous BID Epifanio Lesches, MD      . ipratropium-albuterol (DUONEB) 0.5-2.5 (3) MG/3ML nebulizer solution 3 mL  3 mL Nebulization Q6H PRN Tukov-Yual, Magdalene S, NP      . lactulose (CHRONULAC) 10 GM/15ML solution 30 g  30 g Oral BID Saundra Shelling, MD   30 g at 01/14/18 1451  . levothyroxine (SYNTHROID, LEVOTHROID) tablet 75 mcg  75 mcg Oral Q0600 Saundra Shelling, MD   75 mcg at 01/14/18 1053  . multivitamin with minerals tablet 1 tablet  1 tablet Oral Daily Epifanio Lesches, MD   1 tablet at 01/14/18 1054  . ondansetron (ZOFRAN) tablet 4 mg  4 mg Oral Q6H PRN Saundra Shelling, MD       Or  . ondansetron (ZOFRAN) injection 4 mg  4 mg Intravenous Q6H PRN Saundra Shelling, MD   4 mg at 01/12/18 0916  . oxyCODONE (Oxy IR/ROXICODONE) immediate release tablet 5 mg  5 mg Oral Q4H PRN Epifanio Lesches, MD   5 mg at 01/14/18 1452  . pantoprazole (PROTONIX) EC tablet 40 mg  40 mg Oral BID AC Konidena, Lise Auer, MD      . polyethylene glycol (MIRALAX / GLYCOLAX) packet 17 g  17 g Oral Daily Pyreddy, Reatha Harps, MD   17 g at 01/12/18 1205  . pravastatin (PRAVACHOL) tablet 20 mg  20 mg  Oral q1800 Saundra Shelling, MD   20 mg at 01/13/18 1704  . promethazine (PHENERGAN) injection 6.25 mg  6.25 mg Intravenous Q6H PRN Epifanio Lesches, MD      . rifaximin Doreene Nest) tablet 550 mg  550 mg Oral BID AC & HS Pyreddy, Reatha Harps, MD   550 mg at 01/14/18 0827  . sodium chloride flush (NS) 0.9 % injection 3 mL  3 mL Intravenous Q12H Pyreddy, Pavan, MD   3 mL at 01/14/18 1057  . sodium chloride flush (  NS) 0.9 % injection 3 mL  3 mL Intravenous PRN Saundra Shelling, MD   3 mL at 01/11/18 0924  . tamsulosin (FLOMAX) capsule 0.4 mg  0.4 mg Oral QODAY Pyreddy, Reatha Harps, MD   0.4 mg at 01/13/18 0949    VITAL SIGNS: BP 127/75   Pulse 99   Temp 97.7 F (36.5 C) (Oral)   Resp 20   Ht 5' 11"  (1.803 m)   Wt 184 lb 11.9 oz (83.8 kg)   SpO2 96%   BMI 25.77 kg/m  Filed Weights   01/11/18 0038  Weight: 184 lb 11.9 oz (83.8 kg)    Estimated body mass index is 25.77 kg/m as calculated from the following:   Height as of this encounter: 5' 11"  (1.803 m).   Weight as of this encounter: 184 lb 11.9 oz (83.8 kg).  LABS: CBC:    Component Value Date/Time   WBC 8.9 01/11/2018 0047   HGB 8.7 (L) 01/11/2018 0047   HGB 8.2 (L) 04/28/2012 1503   HCT 28.4 (L) 01/11/2018 0047   HCT 26.1 (L) 04/28/2012 1503   PLT 162 01/11/2018 0047   PLT 357 04/28/2012 1503   MCV 80.0 01/11/2018 0047   MCV 69 (L) 04/28/2012 1503   NEUTROABS 11.0 (H) 01/10/2018 1930   NEUTROABS 4.1 04/28/2012 1503   LYMPHSABS 0.4 (L) 01/10/2018 1930   LYMPHSABS 0.7 (L) 04/28/2012 1503   MONOABS 0.4 01/10/2018 1930   MONOABS 0.6 04/28/2012 1503   EOSABS 0.0 01/10/2018 1930   EOSABS 0.1 04/28/2012 1503   BASOSABS 0.0 01/10/2018 1930   BASOSABS 0.0 04/28/2012 1503   Comprehensive Metabolic Panel:    Component Value Date/Time   NA 137 01/13/2018 0438   NA 121 (L) 09/24/2017 1458   NA 135 (L) 04/28/2012 1503   K 4.4 01/13/2018 0438   K 5.0 04/28/2012 1503   CL 106 01/13/2018 0438   CL 101 04/28/2012 1503   CO2 21 (L)  01/13/2018 0438   CO2 26 04/28/2012 1503   BUN 54 (H) 01/13/2018 0438   BUN 37 (H) 09/24/2017 1458   BUN 13 04/28/2012 1503   CREATININE 1.60 (H) 01/13/2018 0438   CREATININE 1.49 (H) 04/28/2012 1503   GLUCOSE 382 (H) 01/13/2018 0438   GLUCOSE 239 (H) 04/28/2012 1503   CALCIUM 7.8 (L) 01/13/2018 0438   CALCIUM 8.2 (L) 02/19/2017 1522   AST 46 (H) 01/10/2018 1930   AST 25 04/28/2012 1503   ALT 21 01/10/2018 1930   ALT 18 04/28/2012 1503   ALKPHOS 403 (H) 01/10/2018 1930   ALKPHOS 181 (H) 04/28/2012 1503   BILITOT 1.6 (H) 01/13/2018 1517   BILITOT 3.9 (H) 09/23/2017 1336   BILITOT 0.8 04/28/2012 1503   PROT 8.1 01/10/2018 1930   PROT 5.6 (L) 09/23/2017 1336   PROT 8.4 (H) 04/28/2012 1503   ALBUMIN 2.3 (L) 01/10/2018 1930   ALBUMIN 2.8 (L) 09/23/2017 1336   ALBUMIN 3.1 (L) 04/28/2012 1503    RADIOGRAPHIC STUDIES: Nm Pet Image Restag (ps) Skull Base To Thigh  Result Date: 12/17/2017 CLINICAL DATA:  Subsequent treatment strategy for metastatic renal cell carcinoma. EXAM: NUCLEAR MEDICINE PET SKULL BASE TO THIGH TECHNIQUE: 8.5 mCi F-18 FDG was injected intravenously. Full-ring PET imaging was performed from the skull base to thigh after the radiotracer. CT data was obtained and used for attenuation correction and anatomic localization. Fasting blood glucose: 113 mg/dl COMPARISON:  Multiple exams, including PET-CT from 10/15/2016 and CT scans from 09/20/2017 and 06/07/2017 FINDINGS: Mediastinal  blood pool activity: SUV max 1.2 NECK: No significant abnormal hypermetabolic activity in this region. Incidental CT findings: Atherosclerotic calcification of the carotid bulbs. CHEST: 2.7 by 2.2 cm left lower lobe pulmonary nodule has a maximum SUV of 1.8 (formerly 2.4 on 10/15/2016) and previously measured 2.7 by 2.4 cm on 09/20/2017. Right eccentric subcarinal lymph node measures 2.5 cm in short axis on image 93/3 (formerly 2.4 cm by my measurements on 06/07/2017) and has a maximum SUV of 3.0  (formerly 2.7). Indistinctly marginated confluent left hilar adenopathy has a maximum SUV of 2.0 (formerly 2.6). A right internal mammary lymph node measuring 1.0 cm in short axis on image 85/3 (formerly 0.5 cm on 06/07/2017) has a maximum SUV of 2.7 (formerly not hypermetabolic). The scattered bilateral pulmonary nodule 7 minimal faint activity and are similar to those shown on 06/07/2017. Similarly there is very low-grade activity associated with the right hilar adenopathy. Small left and trace right pleural effusions are significantly increased from 09/20/2017, but nonspecific for transudative versus exudative etiology. Incidental CT findings: Coronary, aortic arch, and branch vessel atherosclerotic vascular disease. Cachexia. Diffuse subcutaneous edema. ABDOMEN/PELVIS: Prominent ascites with marginal accentuated activity favoring malignant ascites. Low-grade activity in infiltrative mesenteric and omental tissues. While much of this may be from extensive third spacing of fluid, I can not exclude malignant ascites. A representative section of the left upper quadrant omentum has a maximum SUV of 2.6. Fluid and thickening extend through a left indirect inguinal hernia into the left groin and scrotum, with a maximum SUV of 2.7. There is also a separate notable left hydrocele. Incidental CT findings: Cholelithiasis. Aortoiliac atherosclerotic vascular disease. Prominent splenomegaly. Left nephrectomy. No dilated bowel. Diffuse subcutaneous edema. SKELETON: No significant abnormal hypermetabolic activity in this region. Incidental CT findings: IMPRESSION: 1. In the chest, some areas of malignancy including the dominant left lower lobe pulmonary nodule and the confluent left hilar adenopathy are mildly reduced in maximum SUV compared to the prior exam, and still have low-grade activity. However, other areas such as the right eccentric subcarinal lymph node and new right internal mammary adenopathy are increased in  metabolic activity. 2. Progressive diffuse ascites and diffuse mesenteric and omental edema. There is a thick and hypermetabolic rind associated with the left inguinal hydrocele. There is also some low-grade activity along peritoneal margins and in the omentum. Given the generally low-grade nature of the tumor to start with, I can not exclude peritoneal spread of malignancy. Correlate with any pathology from the paracenteses. If not already performed it may be prudent to send paracentesis fluid for pathology analysis. 3. Cachexia. 4.  Aortic Atherosclerosis (ICD10-I70.0).  Coronary atherosclerosis. 5. Cholelithiasis. 6. Prominent splenomegaly. Electronically Signed   By: Van Clines M.D.   On: 12/17/2017 15:57   US Paracentesis  Result Date: 01/13/2018 INDICATION: Cirrhosis and ascites. EXAM: ULTRASOUND GUIDED PARACENTESIS MEDICATIONS: None. COMPLICATIONS: None immediate. PROCEDURE: Informed written consent was obtained from the patient after a discussion of the risks, benefits and alternatives to treatment. A timeout was performed prior to the initiation of the procedure. Initial ultrasound was performed to localize ascites. The right lower abdomen was prepped and draped in the usual sterile fashion. 1% lidocaine was used for local anesthesia. Following this, a 6 Fr Safe-T-Centesis catheter was introduced. An ultrasound image was saved for documentation purposes. The paracentesis was performed. The catheter was removed and a dressing was applied. The patient tolerated the procedure well without immediate post procedural complication. FINDINGS: A total of approximately 6 L of yellow  fluid was removed. Samples were sent to the laboratory as requested by the clinical team. IMPRESSION: Successful ultrasound-guided paracentesis yielding 6 liters of peritoneal fluid. Electronically Signed   By: Aletta Edouard M.D.   On: 01/13/2018 14:37   US Paracentesis  Result Date: 12/27/2017 INDICATION: 63 year old  male with alcoholic cirrhosis and recurrent large volume ascites. He presents for scheduled therapeutic large volume paracentesis. EXAM: ULTRASOUND GUIDED  PARACENTESIS MEDICATIONS: None. COMPLICATIONS: None immediate. PROCEDURE: Informed written consent was obtained from the patient after a discussion of the risks, benefits and alternatives to treatment. A timeout was performed prior to the initiation of the procedure. Initial ultrasound scanning demonstrates a large amount of ascites within the right lower abdominal quadrant. The right lower abdomen was prepped and draped in the usual sterile fashion. 1% lidocaine with epinephrine was used for local anesthesia. Following this, a 6 Fr Safe-T-Centesis catheter was introduced. An ultrasound image was saved for documentation purposes. The paracentesis was performed. The catheter was removed and a dressing was applied. The patient tolerated the procedure well without immediate post procedural complication. FINDINGS: A total of approximately 5300 mL of yellow ascitic fluid was removed. IMPRESSION: Successful ultrasound-guided paracentesis yielding 5.3 liters of peritoneal fluid. Electronically Signed   By: Jacqulynn Cadet M.D.   On: 12/27/2017 14:20   Dg Chest Port 1 View  Result Date: 01/11/2018 CLINICAL DATA:  Acute respiratory failure. EXAM: PORTABLE CHEST 1 VIEW COMPARISON:  One-view chest x-ray 01/10/2017 FINDINGS: Heart size is normal. Aortic atherosclerosis is again seen. Left basilar airspace disease is similar the prior study. Left effusion is suspected. Minimal atelectasis is present at the right base. IMPRESSION: 1. Similar appearance of left basilar airspace disease, indicating pneumonia. 2. Probable small left pleural effusion. Electronically Signed   By: San Morelle M.D.   On: 01/11/2018 08:13   Dg Chest Port 1 View  Result Date: 01/10/2018 CLINICAL DATA:  Chills and nausea. Previous history of renal cell carcinoma metastatic to lung.  EXAM: PORTABLE CHEST 1 VIEW COMPARISON:  12/12/2017 FINDINGS: Shallow inspiration. Heart size and pulmonary vascularity are normal for technique. Suggestion of infiltration or atelectasis in the left lung base. Probable small left pleural effusion as seen previously. Could consider pneumonia in the appropriate clinical setting. No pneumothorax. Calcification of the aorta. Degenerative changes in the shoulders. IMPRESSION: Infiltration or atelectasis in the left lung base with small left pleural effusion. Consider pneumonia in the appropriate clinical setting. Electronically Signed   By: Lucienne Capers M.D.   On: 01/10/2018 20:00    PERFORMANCE STATUS (ECOG) : 3 - Symptomatic, >50% confined to bed  Review of Systems As noted above. Otherwise, a complete review of systems is negative.  Physical Exam General: frail, thin Cardiovascular: regular rate and rhythm Pulmonary: clear ant fields Abdomen: distended, + fluid wave Extremities: edema Skin: no rashes Neurological: Weakness but otherwise nonfocal  IMPRESSION: Patient more alert today. He had paracentesis yesterday with 6L removed. Patient says his pain is better controlled today.   I met with patient and his daughter, Rodney Stewart. We talked about his multiple medical problems and goals. Patient says he still hopes that he might improve. Daughter says that patient had actually been improving with regards to functioning prior to this hospitalization. He had recently taken a trip to the beach and was able to do more for himself around the house. Patient says he recognizes that he might not return to his previous baseline but wants to "fight". I note that he was too fatigued  to participate today in PT.   We talked about option of hospice care. Patient verbalized an understanding that to qualify for hospice, it would require him to forgo oncologic treatment. Patient says that he does not want to stop treatment now. He says his goals are still aligned  with the current scope of treatment/medical care.   We talked about decision making and ACP. He is a DNR. Patient does not have a HCPOA. He would like to talk with the chaplain about establishing documents while he is here. He thinks he would want his brother to be his 6. His brother is a Merchandiser, retail.   PLAN: Continue current scope of treatment Chaplain to assist with ACP documents   Time Total: 30 minutes  Visit consisted of counseling and education dealing with the complex and emotionally intense issues of symptom management and palliative care in the setting of serious and potentially life-threatening illness.Greater than 50%  of this time was spent counseling and coordinating care related to the above assessment and plan.  Signed by: Altha Harm, PhD, NP-C [phone] (Work Cell)

## 2018-01-14 NOTE — Progress Notes (Addendum)
[facility] NAME: Rodney Stewart    MR#:  [id number]  DATE OF BIRTH:  [birth year]  Groggy today, status post paracentesis yesterday, 8 L of fluid removed, no further abdominal pain, p.o. intake is better.  Does not want to take fentanyl patch anymore.  CHIEF COMPLAINT:   Chief Complaint  Patient presents with  . Chills  . Nausea    REVIEW OF SYSTEMS:   ROS CONSTITUTIONAL: No fever, ..  Patient appears very cachectic.  Contact better. EYES: No blurred or double vision.  EARS, NOSE, AND THROAT: No tinnitus or ear pain.  RESPIRATORY: No cough, shortness of breath, wheezing or hemoptysis.  CARDIOVASCULAR: No chest pain, orthopnea, edema.  GASTROINTESTINA symptoms; abdominal distention, less abdominal pain.  GENITOURINARY: No dysuria, hematuria.  ENDOCRINE: No polyuria, nocturia,  HEMATOLOGY: Anemia, thrombocytopenia due to liver cirrhosis SKIN: No rash or lesion. MUSCULOSKELETAL: No joint pain or arthritis.   NEUROLOGIC: No tingling, numbness, weakness.  PSYCHIATRY: No anxiety or depression.   DRUG ALLERGIES:  No Known Allergies  VITALS:  Blood pressure 126/77, pulse 100, temperature 97.6 F (36.4 C), temperature source Oral, resp. rate 17, height 5\' 11"  (1.803 m), weight 83.8 kg, SpO2 96 %.  PHYSICAL EXAMINATION:  GENERAL:  63 y.o.-year-old patient lying in the bed with no acute distress.  EYES: Pupils equal, round, reactive to light and accommodation. No scleral icterus. Extraocular muscles intact.  HEENT: Head atraumatic, normocephalic. Oropharynx and nasopharynx clear.  NECK:  Supple, no jugular venous distention. No thyroid enlargement, no tenderness.  LUNGS diminished air entry bilaterally cARDIOVASCULAR: S1, S2 normal. No murmurs, rubs, or gallops.  ABDOMEN: Less distended, bowel sounds present. EXTREMITIES: No pedal edema, cyanosis, or clubbing.  NEUROLOGIC: Cranial nerves II through XII are intact. Muscle  strength 5/5 in all extremities. Sensation intact. Gait not checked.  PSYCHIATRIC: The patient is alert and oriented x 3.  SKIN: No obvious rash, lesion, or ulcer.    LABORATORY PANEL:   CBC Recent Labs  Lab 01/11/18 0047  WBC 8.9  HGB 8.7*  HCT 28.4*  PLT 162   ------------------------------------------------------------------------------------------------------------------  Chemistries  Recent Labs  Lab 01/10/18 1930  01/12/18 1424 01/13/18 0438 01/13/18 1517  NA 136   < >  --  137  --   K 3.7   < > 4.7 4.4  --   CL 98   < >  --  106  --   CO2 25   < >  --  21*  --   GLUCOSE 85   < >  --  382*  --   BUN 26*   < >  --  54*  --   CREATININE 1.29*   < >  --  1.60*  --   CALCIUM 8.2*   < >  --  7.8*  --   MG  --    < > 2.2  --   --   AST 46*  --   --   --   --   ALT 21  --   --   --   --   ALKPHOS 403*  --   --   --   --   BILITOT 2.6*  --   --   --  1.6*   < > = values in this interval not displayed.   ------------------------------------------------------------------------------------------------------------------  Cardiac Enzymes Recent Labs  Lab 01/10/18 1930  TROPONINI <0.03   ------------------------------------------------------------------------------------------------------------------  RADIOLOGY:  US  Paracentesis  Result Date: 01/13/2018 INDICATION: Cirrhosis and ascites. EXAM: ULTRASOUND GUIDED PARACENTESIS MEDICATIONS: None. COMPLICATIONS: None immediate. PROCEDURE: Informed written consent was obtained from the patient after a discussion of the risks, benefits and alternatives to treatment. A timeout was performed prior to the initiation of the procedure. Initial ultrasound was performed to localize ascites. The right lower abdomen was prepped and draped in the usual sterile fashion. 1% lidocaine was used for local anesthesia. Following this, a 6 Fr Safe-T-Centesis catheter was introduced. An ultrasound image was saved for documentation purposes. The  paracentesis was performed. The catheter was removed and a dressing was applied. The patient tolerated the procedure well without immediate post procedural complication. FINDINGS: A total of approximately 6 L of yellow fluid was removed. Samples were sent to the laboratory as requested by the clinical team. IMPRESSION: Successful ultrasound-guided paracentesis yielding 6 liters of peritoneal fluid. Electronically Signed   By: Aletta Edouard M.D.   On: 01/13/2018 14:37    EKG:   Orders placed or performed during the hospital encounter of 01/10/18  . EKG 12-Lead  . EKG 12-Lead    ASSESSMENT AND PLAN:  [age]-year-old male patient with history of liver cirrhosis, clear-cell renal cancer comes in because of generalized weakness, body aches and found to have hypothermia, admitted for septic shock, pneumonia in left lung, admitted to ICU.  #1 .sepsis present on admission secondary to pneumonia: Improving, off the BiPAP, continue nasal cannula as needed, continue empiric antibiotics. 2.  Renal cell CA with lung mets, getting immunotherapy, seen by Josh from palliative care prognosis really poor. , 3.  History of liver cirrhosis, ascites ; is post paracentesis, 8 L of fluid removed yesterday.  Continue Lasix, continue lactulose for chronic hepatic encephalopathy. 4.  Diabetes mellitus type 2; stable.  Patient p.o. intake is better, blood sugar is borderline, decrease the dose of Lantus 9 units twice daily.  5.  Liver cirrhosis with auto-anticoagulation, INR 1.47   6.Severe malnutrition secondary to cancer, continue regular diet continue Ensure.   7.  Nausea, vomiting use Phenergan, Zofran, add IV PPI. Intake is better.. 8.  Acute on chronic abdominal pain, more lethargic today does not want fentanyl patch, continue oxycodone 5 mg every 4 hours, use IV Dilaudid, DC fentanyl patch. #9. deconditioning, physical therapy consult today, 10;Acute on chronic renal failure,ckd stage 3;slight bump in cr,decrease  dose of lasix.m,check bmp am   Prognosis poor,  Spoke with patient's daughter today. All the records are reviewed and case discussed with Care Management/Social Workerr. Management plans discussed with the patient, family and they are in agreement.  CODE STATUS; DNR.  TOTAL TIME TAKING CARE OF THIS PATIENT: 38 minutes.   POSSIBLE D/C IN 1-2 DAYS, DEPENDING ON CLINICAL CONDITION.   Epifanio Lesches M.D on 01/14/2018 at 1:56 PM  Between 7am to 6pm - Pager - [phone]  After 6pm go to [email or web address] - password [email or web address] [facility]  Office  [phone]  CC: Primary care physician; [name], [name], MD   Note: This dictation was prepared with Dragon dictation along with smaller phrase technology. Any transcriptional errors that result from this process are unintentional.

## 2018-01-15 ENCOUNTER — Inpatient Hospital Stay: Payer: Medicare HMO

## 2018-01-15 DIAGNOSIS — Z515 Encounter for palliative care: Secondary | ICD-10-CM

## 2018-01-15 LAB — GLUCOSE, CAPILLARY
GLUCOSE-CAPILLARY: 352 mg/dL — AB (ref 70–99)
Glucose-Capillary: 109 mg/dL — ABNORMAL HIGH (ref 70–99)
Glucose-Capillary: 110 mg/dL — ABNORMAL HIGH (ref 70–99)
Glucose-Capillary: 147 mg/dL — ABNORMAL HIGH (ref 70–99)
Glucose-Capillary: 187 mg/dL — ABNORMAL HIGH (ref 70–99)
Glucose-Capillary: 206 mg/dL — ABNORMAL HIGH (ref 70–99)
Glucose-Capillary: 248 mg/dL — ABNORMAL HIGH (ref 70–99)
Glucose-Capillary: 302 mg/dL — ABNORMAL HIGH (ref 70–99)
Glucose-Capillary: 365 mg/dL — ABNORMAL HIGH (ref 70–99)
Glucose-Capillary: 421 mg/dL — ABNORMAL HIGH (ref 70–99)
Glucose-Capillary: 450 mg/dL — ABNORMAL HIGH (ref 70–99)
Glucose-Capillary: 462 mg/dL — ABNORMAL HIGH (ref 70–99)
Glucose-Capillary: 507 mg/dL (ref 70–99)
Glucose-Capillary: 550 mg/dL (ref 70–99)
Glucose-Capillary: 560 mg/dL (ref 70–99)
Glucose-Capillary: 582 mg/dL (ref 70–99)
Glucose-Capillary: 74 mg/dL (ref 70–99)
Glucose-Capillary: 78 mg/dL (ref 70–99)
Glucose-Capillary: 79 mg/dL (ref 70–99)
Glucose-Capillary: 98 mg/dL (ref 70–99)
Glucose-Capillary: >600 mg/dL (ref 70–99)
Glucose-Capillary: >600 mg/dL (ref 70–99)

## 2018-01-15 LAB — CBC
HCT: 28 % — ABNORMAL LOW (ref 39.0–52.0)
Hemoglobin: 8 g/dL — ABNORMAL LOW (ref 13.0–17.0)
MCH: 23.7 pg — ABNORMAL LOW (ref 26.0–34.0)
MCHC: 28.6 g/dL — ABNORMAL LOW (ref 30.0–36.0)
MCV: 82.8 fL (ref 80.0–100.0)
Platelets: 120 10*3/uL — ABNORMAL LOW (ref 150–400)
RBC: 3.38 MIL/uL — AB (ref 4.22–5.81)
RDW: 15.7 % — ABNORMAL HIGH (ref 11.5–15.5)
WBC: 3.3 10*3/uL — ABNORMAL LOW (ref 4.0–10.5)
nRBC: 0 % (ref 0.0–0.2)

## 2018-01-15 LAB — BASIC METABOLIC PANEL
Anion gap: 10 (ref 5–15)
Anion gap: 10 (ref 5–15)
BUN: 64 mg/dL — ABNORMAL HIGH (ref 8–23)
BUN: 62 mg/dL — ABNORMAL HIGH (ref 8–23)
CHLORIDE: 98 mmol/L (ref 98–111)
CO2: 19 mmol/L — ABNORMAL LOW (ref 22–32)
CO2: 19 mmol/L — ABNORMAL LOW (ref 22–32)
CREATININE: 2.23 mg/dL — AB (ref 0.61–1.24)
Calcium: 7.7 mg/dL — ABNORMAL LOW (ref 8.9–10.3)
Calcium: 7.8 mg/dL — ABNORMAL LOW (ref 8.9–10.3)
Chloride: 96 mmol/L — ABNORMAL LOW (ref 98–111)
Creatinine, Ser: 2.27 mg/dL — ABNORMAL HIGH (ref 0.61–1.24)
GFR calc Af Amer: 36 mL/min — ABNORMAL LOW (ref >60)
GFR calc non Af Amer: 30 mL/min — ABNORMAL LOW (ref >60)
GFR calc non Af Amer: 31 mL/min — ABNORMAL LOW (ref >60)
GFR, EST AFRICAN AMERICAN: 35 mL/min — AB (ref >60)
Glucose, Bld: 779 mg/dL (ref 70–99)
Glucose, Bld: 675 mg/dL (ref 70–99)
Potassium: 3.5 mmol/L (ref 3.5–5.1)
Potassium: 3.4 mmol/L — ABNORMAL LOW (ref 3.5–5.1)
SODIUM: 127 mmol/L — AB (ref 135–145)
Sodium: 125 mmol/L — ABNORMAL LOW (ref 135–145)

## 2018-01-15 LAB — URINE CULTURE: Culture: NO GROWTH

## 2018-01-15 LAB — OSMOLALITY: Osmolality: 325 mOsm/kg (ref 275–295)

## 2018-01-15 LAB — CULTURE, BLOOD (ROUTINE X 2)
Culture: NO GROWTH
Culture: NO GROWTH
SPECIAL REQUESTS: ADEQUATE
Special Requests: ADEQUATE

## 2018-01-15 LAB — BETA-HYDROXYBUTYRIC ACID: Beta-Hydroxybutyric Acid: 0.1 mmol/L (ref 0.05–0.27)

## 2018-01-15 LAB — HEMOGLOBIN A1C
Hgb A1c MFr Bld: 6.4 % — ABNORMAL HIGH (ref 4.8–5.6)
Mean Plasma Glucose: 136.98 mg/dL

## 2018-01-15 MED ORDER — INSULIN DETEMIR 100 UNIT/ML ~~LOC~~ SOLN
18.0000 [IU] | Freq: Every day | SUBCUTANEOUS | Status: DC
  Administered 2018-01-15: 18 [IU] via SUBCUTANEOUS
  Filled 2018-01-15 (×2): qty 0.18

## 2018-01-15 MED ORDER — POTASSIUM CHLORIDE CRYS ER 20 MEQ PO TBCR
20.0000 meq | EXTENDED_RELEASE_TABLET | Freq: Once | ORAL | Status: AC
  Administered 2018-01-15: 20 meq via ORAL
  Filled 2018-01-15: qty 1

## 2018-01-15 MED ORDER — INSULIN ASPART 100 UNIT/ML ~~LOC~~ SOLN
0.0000 [IU] | SUBCUTANEOUS | Status: DC
  Administered 2018-01-16 (×2): 2 [IU] via SUBCUTANEOUS
  Administered 2018-01-17: 5 [IU] via SUBCUTANEOUS
  Administered 2018-01-17: 3 [IU] via SUBCUTANEOUS
  Administered 2018-01-17 (×3): 2 [IU] via SUBCUTANEOUS
  Administered 2018-01-18 (×3): 3 [IU] via SUBCUTANEOUS
  Administered 2018-01-18: 7 [IU] via SUBCUTANEOUS
  Administered 2018-01-18: 10:00:00 1 [IU] via SUBCUTANEOUS
  Administered 2018-01-18 – 2018-01-19 (×2): 3 [IU] via SUBCUTANEOUS
  Administered 2018-01-19 (×4): 5 [IU] via SUBCUTANEOUS
  Administered 2018-01-20: 3 [IU] via SUBCUTANEOUS
  Administered 2018-01-20: 7 [IU] via SUBCUTANEOUS
  Administered 2018-01-20: 5 [IU] via SUBCUTANEOUS
  Administered 2018-01-20: 09:00:00 3 [IU] via SUBCUTANEOUS
  Filled 2018-01-15 (×22): qty 1

## 2018-01-15 MED ORDER — INSULIN REGULAR BOLUS VIA INFUSION
0.0000 [IU] | Freq: Three times a day (TID) | INTRAVENOUS | Status: DC
  Filled 2018-01-15: qty 10

## 2018-01-15 MED ORDER — SODIUM CHLORIDE 0.9 % IV SOLN
INTRAVENOUS | Status: DC
  Administered 2018-01-17: 12:00:00 via INTRAVENOUS

## 2018-01-15 MED ORDER — DEXTROSE 50 % IV SOLN: 25.0000 mL | INTRAVENOUS | Status: DC | PRN

## 2018-01-15 MED ORDER — SODIUM CHLORIDE 0.9 % IV SOLN
INTRAVENOUS | Status: DC
  Administered 2018-01-15 (×3): via INTRAVENOUS

## 2018-01-15 MED ORDER — INSULIN REGULAR(HUMAN) IN NACL 100-0.9 UT/100ML-% IV SOLN
INTRAVENOUS | Status: DC
  Administered 2018-01-15: 20 [IU]/h via INTRAVENOUS
  Administered 2018-01-15: 5.2 [IU]/h via INTRAVENOUS
  Administered 2018-01-15: 24.1 [IU]/h via INTRAVENOUS
  Filled 2018-01-15 (×3): qty 100

## 2018-01-15 NOTE — Progress Notes (Signed)
eLink Physician-Brief Progress Note Patient Name: Rodney Stewart DOB: 15-Jul-1955 MRN: [id number]   Date of Service  01/15/2018  HPI/Events of Note  Pt with stage 4 renal cell carcinoma, cirrhosis, recent hepato-renal syndrome, and sepsis due to pneumonia who is currently DNR but was transferred to the ICU for an insulin infusion  to treat persistent hyperglycemia.  eICU Interventions  No intervention at this time. Palliative care is seeing patient to assist with goals of care.        Kerry Kass [name] 01/15/2018, 4:08 AM

## 2018-01-15 NOTE — Progress Notes (Signed)
[place]  [phone] [phone]   Name: Rodney Stewart Date: 01/15/2018 MRN: [id number]  DOB: 1955/08/02  Patient Care Team: [name], [name], MD as PCP - General (Internal Medicine)    REASON FOR CONSULTATION: Palliative Care consult requested for this62 y.o.malewith multiple medical problems including stage IV renal cell carcinoma status post left nephrectomy (May 2014) metastatic to lung, advanced decompensated cirrhosis with recurrent ascites, and CKD 3. Patient's RCC is being managed with second line nivolumab. He was hospitalized 10/18/2017 to 10/20/2017 with hepatorenal syndrome.He is now admitted 01/10/18 with sepsis from PNA. He initially required BIPAP due to respiratory failure.Palliative care was consulted to help establish goals of care.  CODE STATUS: DNR  PAST MEDICAL HISTORY: Past Medical History:  Diagnosis Date  . Anemia   . Ascites   . BPH (benign prostatic hyperplasia)   . Cirrhosis ([place])   . CKD (chronic kidney disease) stage 3, GFR 30-59 ml/min (HCC)   . Clear cell renal cell carcinoma ([place]) 2014   Left Nephrectomy.  . Colon polyps   . Diabetes mellitus without complication ([place])    type 2   . Dyspnea    with exertion  . GERD (gastroesophageal reflux disease)   . History of gout   . History of nephrectomy    Left  . Hypertension   . Neuropathy   . Renal Cancer    Renal Cancer  . Renal cell carcinoma of left kidney (HCC)    mets to lungs  . Umbilical hernia     PAST SURGICAL HISTORY:  Past Surgical History:  Procedure Laterality Date  . BACK SURGERY     ruptured disc 1991   . ENDOBRONCHIAL ULTRASOUND N/A 10/14/2015   Procedure: ENDOBRONCHIAL ULTRASOUND;  Surgeon: [name], MD;  Location: [facility] ORS;  Service: Pulmonary;  Laterality: N/A;  . ESOPHAGOGASTRODUODENOSCOPY (EGD) WITH PROPOFOL N/A 04/15/2017   Procedure: ESOPHAGOGASTRODUODENOSCOPY (EGD) WITH  PROPOFOL;  Surgeon: Jonathon Bellows, MD;  Location: [facility] ENDOSCOPY;  Service: Gastroenterology;  Laterality: N/A;  Screen for esophageal varices  . EYE SURGERY Bilateral    Cataract Extraction with IOL  . IR RADIOLOGIST EVAL & MGMT  10/15/2017  . KNEE SURGERY Right   . NEPHRECTOMY     left kidney 2014 renal cell cancer     HEMATOLOGY/ONCOLOGY HISTORY:  Oncology History   Patient underwent left nephrectomy on May 16, 2012 which revealed a clear cell grade 2 renal cell carcinoma, stage TIIIa, N0, M0. Tumor size of 16 cm. Patient was noted to have renal vein involvement, but no other structures were involved. 0 of 2 lymph nodes were negative for disease. PET scan on October 20, 2015 revealed metastatic disease and patient was initiated on Votrient. This was subsequently discontinued in March 2018 secondary to progression of disease. Patient initiated second line treatment with nivolumab on Monday, April 02, 2016     Metastatic renal cell carcinoma to lung [facility])    Initial Diagnosis    Metastatic renal cell carcinoma to lung ([place])     ALLERGIES:  has No Known Allergies.  MEDICATIONS:  Current Facility-Administered Medications  Medication Dose Route Frequency Provider Last Rate Last Dose  . 0.9 %  sodium chloride infusion  250 mL Intravenous PRN Saundra Shelling, MD   Stopped at 01/14/18 1613  . 0.9 %  sodium chloride infusion   Intravenous Continuous Lance Coon, MD 150 mL/hr at 01/15/18 0804    . acetaminophen (TYLENOL) tablet 650 mg  650 mg Oral Q6H PRN Saundra Shelling, MD       Or  . acetaminophen (TYLENOL) suppository 650 mg  650 mg Rectal Q6H PRN Pyreddy, Reatha Harps, MD      . allopurinol (ZYLOPRIM) tablet 100 mg  100 mg Oral Daily Pyreddy, Pavan, MD   100 mg at 01/14/18 1053  . aspirin EC tablet 81 mg  81 mg Oral Daily Pyreddy, Reatha Harps, MD   81 mg at 01/15/18 1100  . cefTRIAXone (ROCEPHIN) 2 g in sodium chloride 0.9 % 100 mL IVPB  2 g Intravenous Q24H Epifanio Lesches, MD   Stopped at  01/14/18 1541  . dextrose 50 % solution 25 mL  25 mL Intravenous PRN Lance Coon, MD      . docusate sodium (COLACE) capsule 100 mg  100 mg Oral BID PRN Pyreddy, Reatha Harps, MD      . feeding supplement (ENSURE ENLIVE) (ENSURE ENLIVE) liquid 237 mL  237 mL Oral TID BM Pyreddy, Pavan, MD   237 mL at 01/15/18 1100  . gabapentin (NEURONTIN) capsule 200 mg  200 mg Oral BID Epifanio Lesches, MD   200 mg at 01/15/18 1100  . HYDROmorphone (DILAUDID) injection 0.5 mg  0.5 mg Intravenous Q4H PRN Lance Coon, MD   0.5 mg at 01/14/18 0827  . insulin regular bolus via infusion 0-10 Units  0-10 Units Intravenous TID WC Lance Coon, MD      . insulin regular, human (MYXREDLIN) 100 units/ 100 mL infusion   Intravenous Continuous Lance Coon, MD 24.1 mL/hr at 01/15/18 1307 24.1 Units/hr at 01/15/18 1307  . ipratropium-albuterol (DUONEB) 0.5-2.5 (3) MG/3ML nebulizer solution 3 mL  3 mL Nebulization Q6H PRN Tukov-Yual, Magdalene S, NP      . lactulose (CHRONULAC) 10 GM/15ML solution 30 g  30 g Oral BID Pyreddy, Reatha Harps, MD   30 g at 01/15/18 1100  . levothyroxine (SYNTHROID, LEVOTHROID) tablet 75 mcg  75 mcg Oral Q0600 Saundra Shelling, MD   75 mcg at 01/15/18 0614  . multivitamin with minerals tablet 1 tablet  1 tablet Oral Daily Epifanio Lesches, MD   1 tablet at 01/14/18 1054  . ondansetron (ZOFRAN) tablet 4 mg  4 mg Oral Q6H PRN Saundra Shelling, MD       Or  . ondansetron (ZOFRAN) injection 4 mg  4 mg Intravenous Q6H PRN Saundra Shelling, MD   4 mg at 01/15/18 1123  . oxyCODONE (Oxy IR/ROXICODONE) immediate release tablet 5 mg  5 mg Oral Q4H PRN Epifanio Lesches, MD   5 mg at 01/14/18 1452  . pantoprazole (PROTONIX) EC tablet 40 mg  40 mg Oral BID AC Epifanio Lesches, MD   40 mg at 01/15/18 0803  . polyethylene glycol (MIRALAX / GLYCOLAX) packet 17 g  17 g Oral Daily Pyreddy, Pavan, MD   17 g at 01/15/18 1100  . pravastatin (PRAVACHOL) tablet 20 mg  20 mg Oral q1800 Saundra Shelling, MD   20 mg at  01/14/18 1804  . rifaximin (XIFAXAN) tablet 550 mg  550 mg Oral BID AC & HS Saundra Shelling, MD   550 mg at 01/15/18 0803  . sodium chloride flush (NS) 0.9 % injection 3 mL  3 mL Intravenous Q12H Pyreddy, Pavan, MD   3 mL at 01/15/18 1100  . sodium chloride flush (NS) 0.9 % injection 3 mL  3 mL Intravenous PRN Saundra Shelling, MD   3 mL at 01/11/18 0924  . tamsulosin (FLOMAX) capsule 0.4 mg  0.4 mg Oral QODAY Pyreddy,  Reatha Harps, MD   0.4 mg at 01/15/18 1100    VITAL SIGNS: BP 101/71   Pulse 85   Temp 98 F (36.7 C) (Oral)   Resp 11   Ht 5' 11"  (1.803 m)   Wt 175 lb 14.8 oz (79.8 kg)   SpO2 100%   BMI 24.54 kg/m  Filed Weights   01/11/18 0038 01/15/18 0354  Weight: 184 lb 11.9 oz (83.8 kg) 175 lb 14.8 oz (79.8 kg)    Estimated body mass index is 24.54 kg/m as calculated from the following:   Height as of this encounter: 5' 11"  (1.803 m).   Weight as of this encounter: 175 lb 14.8 oz (79.8 kg).  LABS: CBC:    Component Value Date/Time   WBC 3.3 (L) 01/15/2018 0500   HGB 8.0 (L) 01/15/2018 0500   HGB 8.2 (L) 04/28/2012 1503   HCT 28.0 (L) 01/15/2018 0500   HCT 26.1 (L) 04/28/2012 1503   PLT 120 (L) 01/15/2018 0500   PLT 357 04/28/2012 1503   MCV 82.8 01/15/2018 0500   MCV 69 (L) 04/28/2012 1503   NEUTROABS 11.0 (H) 01/10/2018 1930   NEUTROABS 4.1 04/28/2012 1503   LYMPHSABS 0.4 (L) 01/10/2018 1930   LYMPHSABS 0.7 (L) 04/28/2012 1503   MONOABS 0.4 01/10/2018 1930   MONOABS 0.6 04/28/2012 1503   EOSABS 0.0 01/10/2018 1930   EOSABS 0.1 04/28/2012 1503   BASOSABS 0.0 01/10/2018 1930   BASOSABS 0.0 04/28/2012 1503   Comprehensive Metabolic Panel:    Component Value Date/Time   NA 127 (L) 01/15/2018 0407   NA 121 (L) 09/24/2017 1458   NA 135 (L) 04/28/2012 1503   K 3.4 (L) 01/15/2018 0407   K 5.0 04/28/2012 1503   CL 98 01/15/2018 0407   CL 101 04/28/2012 1503   CO2 19 (L) 01/15/2018 0407   CO2 26 04/28/2012 1503   BUN 62 (H) 01/15/2018 0407   BUN 37 (H) 09/24/2017  1458   BUN 13 04/28/2012 1503   CREATININE 2.23 (H) 01/15/2018 0407   CREATININE 1.49 (H) 04/28/2012 1503   GLUCOSE 675 (HH) 01/15/2018 0407   GLUCOSE 239 (H) 04/28/2012 1503   CALCIUM 7.8 (L) 01/15/2018 0407   CALCIUM 8.2 (L) 02/19/2017 1522   AST 46 (H) 01/10/2018 1930   AST 25 04/28/2012 1503   ALT 21 01/10/2018 1930   ALT 18 04/28/2012 1503   ALKPHOS 403 (H) 01/10/2018 1930   ALKPHOS 181 (H) 04/28/2012 1503   BILITOT 1.6 (H) 01/13/2018 1517   BILITOT 3.9 (H) 09/23/2017 1336   BILITOT 0.8 04/28/2012 1503   PROT 8.1 01/10/2018 1930   PROT 5.6 (L) 09/23/2017 1336   PROT 8.4 (H) 04/28/2012 1503   ALBUMIN 2.3 (L) 01/10/2018 1930   ALBUMIN 2.8 (L) 09/23/2017 1336   ALBUMIN 3.1 (L) 04/28/2012 1503    RADIOGRAPHIC STUDIES: Nm Pet Image Restag (ps) Skull Base To Thigh  Result Date: 12/17/2017 CLINICAL DATA:  Subsequent treatment strategy for metastatic renal cell carcinoma. EXAM: NUCLEAR MEDICINE PET SKULL BASE TO THIGH TECHNIQUE: 8.5 mCi F-18 FDG was injected intravenously. Full-ring PET imaging was performed from the skull base to thigh after the radiotracer. CT data was obtained and used for attenuation correction and anatomic localization. Fasting blood glucose: 113 mg/dl COMPARISON:  Multiple exams, including PET-CT from 10/15/2016 and CT scans from 09/20/2017 and 06/07/2017 FINDINGS: Mediastinal blood pool activity: SUV max 1.2 NECK: No significant abnormal hypermetabolic activity in this region. Incidental CT findings: Atherosclerotic calcification of the  carotid bulbs. CHEST: 2.7 by 2.2 cm left lower lobe pulmonary nodule has a maximum SUV of 1.8 (formerly 2.4 on 10/15/2016) and previously measured 2.7 by 2.4 cm on 09/20/2017. Right eccentric subcarinal lymph node measures 2.5 cm in short axis on image 93/3 (formerly 2.4 cm by my measurements on 06/07/2017) and has a maximum SUV of 3.0 (formerly 2.7). Indistinctly marginated confluent left hilar adenopathy has a maximum SUV of 2.0  (formerly 2.6). A right internal mammary lymph node measuring 1.0 cm in short axis on image 85/3 (formerly 0.5 cm on 06/07/2017) has a maximum SUV of 2.7 (formerly not hypermetabolic). The scattered bilateral pulmonary nodule 7 minimal faint activity and are similar to those shown on 06/07/2017. Similarly there is very low-grade activity associated with the right hilar adenopathy. Small left and trace right pleural effusions are significantly increased from 09/20/2017, but nonspecific for transudative versus exudative etiology. Incidental CT findings: Coronary, aortic arch, and branch vessel atherosclerotic vascular disease. Cachexia. Diffuse subcutaneous edema. ABDOMEN/PELVIS: Prominent ascites with marginal accentuated activity favoring malignant ascites. Low-grade activity in infiltrative mesenteric and omental tissues. While much of this may be from extensive third spacing of fluid, I can not exclude malignant ascites. A representative section of the left upper quadrant omentum has a maximum SUV of 2.6. Fluid and thickening extend through a left indirect inguinal hernia into the left groin and scrotum, with a maximum SUV of 2.7. There is also a separate notable left hydrocele. Incidental CT findings: Cholelithiasis. Aortoiliac atherosclerotic vascular disease. Prominent splenomegaly. Left nephrectomy. No dilated bowel. Diffuse subcutaneous edema. SKELETON: No significant abnormal hypermetabolic activity in this region. Incidental CT findings: IMPRESSION: 1. In the chest, some areas of malignancy including the dominant left lower lobe pulmonary nodule and the confluent left hilar adenopathy are mildly reduced in maximum SUV compared to the prior exam, and still have low-grade activity. However, other areas such as the right eccentric subcarinal lymph node and new right internal mammary adenopathy are increased in metabolic activity. 2. Progressive diffuse ascites and diffuse mesenteric and omental edema. There is  a thick and hypermetabolic rind associated with the left inguinal hydrocele. There is also some low-grade activity along peritoneal margins and in the omentum. Given the generally low-grade nature of the tumor to start with, I can not exclude peritoneal spread of malignancy. Correlate with any pathology from the paracenteses. If not already performed it may be prudent to send paracentesis fluid for pathology analysis. 3. Cachexia. 4.  Aortic Atherosclerosis (ICD10-I70.0).  Coronary atherosclerosis. 5. Cholelithiasis. 6. Prominent splenomegaly. Electronically Signed   By: Van Clines M.D.   On: 12/17/2017 15:57   US Paracentesis  Result Date: 01/13/2018 INDICATION: Cirrhosis and ascites. EXAM: ULTRASOUND GUIDED PARACENTESIS MEDICATIONS: None. COMPLICATIONS: None immediate. PROCEDURE: Informed written consent was obtained from the patient after a discussion of the risks, benefits and alternatives to treatment. A timeout was performed prior to the initiation of the procedure. Initial ultrasound was performed to localize ascites. The right lower abdomen was prepped and draped in the usual sterile fashion. 1% lidocaine was used for local anesthesia. Following this, a 6 Fr Safe-T-Centesis catheter was introduced. An ultrasound image was saved for documentation purposes. The paracentesis was performed. The catheter was removed and a dressing was applied. The patient tolerated the procedure well without immediate post procedural complication. FINDINGS: A total of approximately 6 L of yellow fluid was removed. Samples were sent to the laboratory as requested by the clinical team. IMPRESSION: Successful ultrasound-guided paracentesis yielding 6 liters  of peritoneal fluid. Electronically Signed   By: Aletta Edouard M.D.   On: 01/13/2018 14:37   US Paracentesis  Result Date: 12/27/2017 INDICATION: 63 year old male with alcoholic cirrhosis and recurrent large volume ascites. He presents for scheduled therapeutic  large volume paracentesis. EXAM: ULTRASOUND GUIDED  PARACENTESIS MEDICATIONS: None. COMPLICATIONS: None immediate. PROCEDURE: Informed written consent was obtained from the patient after a discussion of the risks, benefits and alternatives to treatment. A timeout was performed prior to the initiation of the procedure. Initial ultrasound scanning demonstrates a large amount of ascites within the right lower abdominal quadrant. The right lower abdomen was prepped and draped in the usual sterile fashion. 1% lidocaine with epinephrine was used for local anesthesia. Following this, a 6 Fr Safe-T-Centesis catheter was introduced. An ultrasound image was saved for documentation purposes. The paracentesis was performed. The catheter was removed and a dressing was applied. The patient tolerated the procedure well without immediate post procedural complication. FINDINGS: A total of approximately 5300 mL of yellow ascitic fluid was removed. IMPRESSION: Successful ultrasound-guided paracentesis yielding 5.3 liters of peritoneal fluid. Electronically Signed   By: Jacqulynn Cadet M.D.   On: 12/27/2017 14:20   Dg Chest Port 1 View  Result Date: 01/11/2018 CLINICAL DATA:  Acute respiratory failure. EXAM: PORTABLE CHEST 1 VIEW COMPARISON:  One-view chest x-ray 01/10/2017 FINDINGS: Heart size is normal. Aortic atherosclerosis is again seen. Left basilar airspace disease is similar the prior study. Left effusion is suspected. Minimal atelectasis is present at the right base. IMPRESSION: 1. Similar appearance of left basilar airspace disease, indicating pneumonia. 2. Probable small left pleural effusion. Electronically Signed   By: San Morelle M.D.   On: 01/11/2018 08:13   Dg Chest Port 1 View  Result Date: 01/10/2018 CLINICAL DATA:  Chills and nausea. Previous history of renal cell carcinoma metastatic to lung. EXAM: PORTABLE CHEST 1 VIEW COMPARISON:  12/12/2017 FINDINGS: Shallow inspiration. Heart size and  pulmonary vascularity are normal for technique. Suggestion of infiltration or atelectasis in the left lung base. Probable small left pleural effusion as seen previously. Could consider pneumonia in the appropriate clinical setting. No pneumothorax. Calcification of the aorta. Degenerative changes in the shoulders. IMPRESSION: Infiltration or atelectasis in the left lung base with small left pleural effusion. Consider pneumonia in the appropriate clinical setting. Electronically Signed   By: Lucienne Capers M.D.   On: 01/10/2018 20:00    PERFORMANCE STATUS (ECOG) : 3 - Symptomatic, >50% confined to bed  Review of Systems As noted above. Otherwise, a complete review of systems is negative.  Physical Exam General: frail, thin Cardiovascular: regular rate and rhythm Pulmonary: clear ant fields Abdomen: distended, + fluid wave Extremities: edema Skin: no rashes Neurological: Weakness but otherwise nonfocal  IMPRESSION: Patient moved to the ICU overnight due to hyperglycemia. He initially required an insulin drip but this has since been discontinued with stabilization of serum glucose levels. Patient has been somewhat lethargic today per family. He now wakes when stimulated and answers simple questions. He is hungry/thirsty.   I met again with patient's daughter, Rodney Stewart. We talked about the ups/downs associated with patient's acute illness. She says that family were feeling more optimistic yesterday based on his improvement and today are more inclined to feel that things are not going to go well in the future. We talked about his overall prognosis and she recognizes that this is an event that could potentially herald future decline.   Patient's brother, who is a cardiologist, is en route to  the hospital and will be here tomorrow. Daughter says they are hopeful that he can help guide them with decision making.   PLAN: Continue current scope of treatment Will follow   Time Total: 30  minutes  Visit consisted of counseling and education dealing with the complex and emotionally intense issues of symptom management and palliative care in the setting of serious and potentially life-threatening illness.Greater than 50%  of this time was spent counseling and coordinating care related to the above assessment and plan.  Signed by: Altha Harm, PhD, NP-C [phone] (Work Cell)

## 2018-01-15 NOTE — Progress Notes (Signed)
   01/15/18 1445  Clinical Encounter Type  Visited With Family  Visit Type Follow-up  Spiritual Encounters  Spiritual Needs Emotional  Daughter anticipates family members' visit tomorrow where she will be able to discuss further about completing an Advance Directive. Daughter is distressed about her father's quickly declining conditions. Family wants pt to stay positive. Pt was asleep. Ch offered emotional support.

## 2018-01-15 NOTE — Progress Notes (Signed)
Patient resting in bed at this time. Refused pm medications. Patient states he doesn't want to take them at this time. NP aware.  No acute distress noted. Will continue to monitor. Report given to oncoming nurse.

## 2018-01-15 NOTE — Consult Note (Signed)
PULMONARY / CRITICAL CARE MEDICINE   Name: Rodney Stewart MRN: [id number] DOB: June 28, 1955    ADMISSION DATE:  01/10/2018 CONSULTATION DATE:  01/15/2018  REFERRING MD:  Dr. Jannifer Franklin  CHIEF COMPLAINT:  Hyperglycemia  BRIEF DISCUSSION: 63 y.o. Male admitted initially to [facility] Med-Surg unit on 01/10/18 with Sepsis secondary to Pneumonia, Acute on Chronic Renal Failure, Cirrhosis, and Ascites.  On 1/11 he required transfer to ICU for acute respiratory distress requiring Bipap.  He was able to transition off Bipap and be transferred back to Med-surg unit.  On 1/13 he underwent Paracentesis with removal of 6L fluid. On 1/15, pt with marked hyperglycemia (suspect HHNK) requiring transfer to [facility] unit for insulin drip.  HISTORY OF PRESENT ILLNESS:   Rodney Stewart is a 63 y.o. Male with a PMH of Cirrhosis, Ascites, CKD III, Clear cell renal cell carcinoma with lung mets, s/p Nephrectomy, Diabetes, GERD, HTN who presented to [facility] ED on 01/10/18 with c/o chills and nausea.  He was found to be hypothermic with elevated lactic acid.  He met criteria for sepsis, of which he received IV fluids and broad spectrum antibiotics.  He was admitted to [place] unit for treatment of Sepsis secondary to Pneumonia, cirrhosis, ascites, and acute on chronic renal failure.  On 1/11 he developed acute respiratory distress requiring transfer to ICU for BiPAP.  He was able to be weaned off Bipap, with transfer back to [facility] unit.  On 1/13 he underwent paracentesis with removal of 6L fluid.  On 1/15 he was noted to have marked hyperglycemia (suspect HHNK), requiring transfer to [facility] for insulin drip.  PCCM is consulted for further management.  PAST MEDICAL HISTORY :  He  has a past medical history of Anemia, Ascites, BPH (benign prostatic hyperplasia), Cirrhosis ([place]), CKD (chronic kidney disease) stage 3, GFR 30-59 ml/min (HCC), Clear cell renal cell carcinoma ([place]) (2014), Colon polyps, Diabetes mellitus without complication  ([place]), Dyspnea, GERD (gastroesophageal reflux disease), History of gout, History of nephrectomy, Hypertension, Neuropathy, Renal Cancer, Renal cell carcinoma of left kidney ([place]), and Umbilical hernia.  PAST SURGICAL HISTORY: He  has a past surgical history that includes Nephrectomy; Knee surgery (Right); Back surgery; Eye surgery (Bilateral); Endobronchial ultrasound (N/A, 10/14/2015); Esophagogastroduodenoscopy (egd) with propofol (N/A, 04/15/2017); and IR Radiologist Eval & Mgmt (10/15/2017).  No Known Allergies  No current facility-administered medications on file prior to encounter.    Current Outpatient Medications on File Prior to Encounter  Medication Sig  . allopurinol (ZYLOPRIM) 100 MG tablet Take 1 tablet (100 mg total) by mouth daily.  Marland Kitchen ALPRAZolam (XANAX) 1 MG tablet Take 1 mg by mouth 2 (two) times daily as needed for anxiety or sleep.   Marland Kitchen aspirin EC 81 MG tablet Take 81 mg by mouth daily.  Marland Kitchen docusate sodium (COLACE) 100 MG capsule Take 1 capsule (100 mg total) by mouth 2 (two) times daily as needed for mild constipation.  . furosemide (LASIX) 20 MG tablet Take 1-2 tablets (20-40 mg total) by mouth daily. In am as needed leg edema  . gabapentin (NEURONTIN) 100 MG capsule Take 2 capsules (200 mg total) by mouth 3 (three) times daily. (Patient taking differently: Take 200 mg by mouth 2 (two) times daily. )  . insulin aspart (NOVOLOG) 100 UNIT/ML FlexPen Before meals 131-180 2 units, 181-240 4 units, 241-300 6 units, 301-350 8 units, 351-400 10 units, >400 12 units  . Insulin Detemir (LEVEMIR FLEXTOUCH) 100 UNIT/ML Pen Inject 18 Units into the skin 2 (two) times daily.  With food  . levothyroxine (SYNTHROID, LEVOTHROID) 75 MCG tablet Take 1 tablet (75 mcg total) by mouth daily before breakfast. 30 minutes  . lovastatin (MEVACOR) 20 MG tablet Take 1 tablet (20 mg total) by mouth daily at 6 PM.  . potassium chloride (K-DUR) 10 MEQ tablet Take 2 tablets (20 mEq total) by mouth daily.  .  rifaximin (XIFAXAN) 550 MG TABS tablet Take 1 tablet (550 mg total) by mouth 2 (two) times daily at 8 am and 10 pm.  . tamsulosin (FLOMAX) 0.4 MG CAPS capsule Take 1 capsule (0.4 mg total) by mouth every other day. With supper  . feeding supplement, ENSURE ENLIVE, (ENSURE ENLIVE) LIQD Take 237 mLs by mouth 3 (three) times daily between meals.  . Insulin Pen Needle 32G X 4 MM MISC 1 Device by Does not apply route 2 (two) times daily. E11.9  . lactulose (CHRONULAC) 10 GM/15ML solution Take 45 mLs (30 g total) by mouth daily.  . magic mouthwash w/lidocaine SOLN Take 5 mLs by mouth 3 (three) times daily as needed for mouth pain. Swish and spit (Patient not taking: Reported on 12/23/2017)  . mupirocin ointment (BACTROBAN) 2 % Apply 1 application topically 3 (three) times daily. Left big toe and right back (Patient not taking: Reported on 12/23/2017)  . ONE TOUCH ULTRA TEST test strip Bid  . oxyCODONE (ROXICODONE) 5 MG immediate release tablet Take 1 tablet (5 mg total) by mouth every 6 (six) hours as needed for severe pain or breakthrough pain. (Patient not taking: Reported on 12/23/2017)  . polyethylene glycol (MIRALAX) packet Take 17 g by mouth daily.  . potassium chloride SA (K-DUR,KLOR-CON) 20 MEQ tablet Take 1 tablet (20 mEq total) by mouth daily for 3 days.    FAMILY HISTORY:  His He indicated that his mother is deceased. He indicated that his father is deceased.   SOCIAL HISTORY: He  reports that he quit smoking about 24 years ago. His smoking use included cigarettes. He smoked 1.00 pack per day. He has never used smokeless tobacco. He reports previous alcohol use. He reports that he does not use drugs.  REVIEW OF SYSTEMS:   Positives in BOLD:  Pt currently denies all complaints Gen: Denies fever, chills, weight change, fatigue, night sweats HEENT: Denies blurred vision, double vision, hearing loss, tinnitus, sinus congestion, rhinorrhea, sore throat, neck stiffness, dysphagia PULM: Denies  shortness of breath, cough, sputum production, hemoptysis, wheezing CV: Denies chest pain, edema, orthopnea, paroxysmal nocturnal dyspnea, palpitations GI: Denies abdominal pain, nausea, vomiting, diarrhea, hematochezia, melena, constipation, change in bowel habits GU: Denies dysuria, hematuria, polyuria, oliguria, urethral discharge Endocrine: Denies hot or cold intolerance, polyuria, polyphagia or appetite change Derm: Denies rash, dry skin, scaling or peeling skin change Heme: Denies easy bruising, bleeding, bleeding gums Neuro: Denies headache, numbness, weakness, slurred speech, loss of memory or consciousness   SUBJECTIVE:  Denies fever, chills, shortness of breath, cough, chest pain, or abdominal pain Pt reports he would like to have pulse ox changed off his finger Afebrile BP stable  On Room air  VITAL SIGNS: BP 124/80 (BP Location: Left Arm)   Pulse 96   Temp 97.6 F (36.4 C) (Oral)   Resp 15   Ht 5' 11"  (1.803 m)   Wt 83.8 kg   SpO2 99%   BMI 25.77 kg/m   HEMODYNAMICS:    VENTILATOR SETTINGS:    INTAKE / OUTPUT: I/O last 3 completed shifts: In: 1552 [P.O.:840; IV Piggyback:200] Out: 1150 [Urine:1150]  PHYSICAL EXAMINATION:  General:  Acute on chronically ill appearing male, sitting in bed, on room air, in NAD Neuro:  Awake, A&O x3, disoriented to situation, follows commands, no focal deficits, speech clear HEENT:  Atraumatic, normocephalic, neck supple, no JVD Cardiovascular:  RRR, s1s2, no M/R/G Lungs:  Clear to auscultation bilaterally, even, nonlabored, normal effort Abdomen:  Distended, soft, nontender, no guarding or rebound tenderness, BS+ x4 Musculoskeletal:  No deformities, 2+ edema bilateral LE Skin:  Warm/dry.  No obvious rashes, lesions, or ulcerations  LABS:  BMET Recent Labs  Lab 01/13/18 0438 01/14/18 1917 01/15/18 0050  NA 137 127* 125*  K 4.4 4.0 3.5  CL 106 97* 96*  CO2 21* 17* 19*  BUN 54* 63* 64*  CREATININE 1.60* 2.12* 2.27*   GLUCOSE 382* 861* 779*    Electrolytes Recent Labs  Lab 01/11/18 0552 01/12/18 1424 01/13/18 0438 01/14/18 1917 01/15/18 0050  CALCIUM  --   --  7.8* 7.7* 7.7*  MG 1.4* 2.2  --   --   --   PHOS 4.3  --   --   --   --     CBC Recent Labs  Lab 01/10/18 1930 01/11/18 0047 01/15/18 0500  WBC 11.9* 8.9 3.3*  HGB 10.4* 8.7* 8.0*  HCT 35.0* 28.4* 28.0*  PLT 258 162 120*    Coag's Recent Labs  Lab 01/10/18 1930 01/11/18 1002  APTT 39*  --   INR 1.25 1.47    Sepsis Markers Recent Labs  Lab 01/10/18 1930 01/10/18 1934 01/11/18 0047 01/11/18 1002  LATICACIDVEN  --  4.26* 1.7 2.2*  PROCALCITON 0.58  --   --   --     ABG Recent Labs  Lab 01/11/18 0221  PHART 7.37  PCO2ART 37  PO2ART 100    Liver Enzymes Recent Labs  Lab 01/09/18 1407 01/10/18 1930 01/13/18 1517  AST 37 46*  --   ALT 20 21  --   ALKPHOS 358* 403*  --   BILITOT 1.9* 2.6* 1.6*  ALBUMIN 2.0* 2.3*  --     Cardiac Enzymes Recent Labs  Lab 01/10/18 1930  TROPONINI <0.03    Glucose Recent Labs  Lab 01/14/18 1738 01/14/18 1739 01/14/18 1852 01/14/18 1854 01/14/18 2258 01/15/18 0132  GLUCAP >600* >600* >600* >600* >600* >600*    Imaging No results found.   STUDIES:   CULTURES: Blood x2 01/10/18>> Peritoneal fluid 01/13/18>> Urine 01/13/18>>  ANTIBIOTICS: Cefepime 1/10>>1/13 Flagyl 1/10>>1/13 Vancomycin 1/10>1/13 Rocephin 1/13>>  SIGNIFICANT EVENTS: 01/10/18>> Admission to [facility] Med-surg unit 01/11/18>> Transfer to ICU for acute respiratory distress requiring bipap 01/13/18>> Paracentesis, removed 6L fluid 115/20>> Transfer to Stepdown for insulin drip due to marked Hyperglycemia  LINES/TUBES:   ASSESSMENT / PLAN:  PULMONARY A: Pneumonia (HCAP) P:   Supplemental O2 as needed to maintain O2 sats >92% Follow intermittent CXR and ABG as needed Follow cultures as above Continue Rocephin  CARDIOVASCULAR A:  No acute issues Hx: HTN P:  Cardiac  monitoring Maintain MAP >65  RENAL A:   Acute on Chronic renal failure  Renal cell cancer Hyponatremia (Isot[place]) in setting of severe Hyperglycemia>> corrected Na given glucose of 779 is 136 Hx: CKD III, Nephrectomy, BPH P:   Monitor I&O's / urinary output Follow BMP Ensure adequate renal perfusion Avoid nephrotoxic agents as able Replace electrolytes as indicated On immunotherapy  GASTROINTESTINAL A:   Liver cirrhosis Ascites Hx: GERD P:   S/p Paracentesis on 1/13 with 6L fluid removed Continue Lactulose and Rifaximin Continue PO  Lasix Continue PO Protonix  HEMATOLOGIC A:   Anemia without signs of active bleeding Thrombocytopenia P:  Monitor for S/Sx of bleeding Trend CBC SCD's for VTE Prophylaxis  Transfuse for Hgb <7 Transfuse platelets for platelet count <50 and active bleeding  INFECTIOUS A:   Sepsis secondary to Pneumonia (HCAP)>>improving Leukocytopenia, likely secondary to immunotherapy and renal cell cancer P:   Monitor fever curve Trend WBC's Follow cultures as above Continue Rocephin  ENDOCRINE A:   Diabetes Mellitus with Marked Hyperglycemia, likely HHNK   Hypothyroidism P:   CBG's Insulin drip Follow ICU Hypo/hyperglycemia protocol Check serum osmolality and Beta-Hydroxybutyric acid IVF Continue Synthroid  NEUROLOGIC A:   Hepatic Encephalopathy>>Resolved Hx: Neuropathy P:   Provide supportive care Avoid sedating meds as able Continue Lactulose and Rifaximin   FAMILY  - Updates: No family present at bedside during NP rounds 1/15.  Updated pt at bedside.  Pt is a DNR/DNI  - Inter-disciplinary family meet or Palliative Care meeting due by: Palliative care already following pt. Prognosis is very poor and guarded.    Darel Hong, AGACNP-BC [facility] Pager: [phone] Cell: [phone]  01/15/2018, 3:51 AM

## 2018-01-15 NOTE — Progress Notes (Signed)
[place] [facility] [place] NAME: Rodney Stewart    MR#:  [id number]  DATE OF BIRTH:  Jun 03, 1955  Transfer to ICU yesterday evening for insulin drip due to elevated blood sugar to more than 800.  CHIEF COMPLAINT:   Chief Complaint  Patient presents with  . Chills  . Nausea    REVIEW OF SYSTEMS:   ROS CONSTITUTIONAL: No fever, is very poor p.o. intake..  Patient appears very cachectic.  Patient is resting, sleeping sound, daughter is at bedside and does not want to wake him up.  Appears severely cachectic. EARS, NOSE, AND THROAT: No tinnitus or ear pain.  RESPIRATORY: No cough, shortness of breath, wheezing or hemoptysis.  CARDIOVASCULAR: No chest pain, orthopnea, edema.  GASTROINTESTINA symptoms; ; lessabdominal pain. GENITOURINARY: No dysuria, hematuria.  ENDOCRINE: No polyuria, nocturia,  HEMATOLOGY: Anemia, thrombocytopenia due to liver cirrhosis SKIN: No rash or lesion. MUSCULOSKELETAL: No joint pain or arthritis.   NEUROLOGIC: No tingling, numbness, weakness.  PSYCHIATRY: No anxiety or depression.   DRUG ALLERGIES:  No Known Allergies  VITALS:  Blood pressure 111/77, pulse 81, temperature 97.8 F (36.6 C), temperature source Oral, resp. rate 11, height 5\' 11"  (1.803 m), weight 79.8 kg, SpO2 97 %.  PHYSICAL EXAMINATION:  GENERAL:  63 y.o.-year-old patient lying in the bed with no acute distress.  EYES: Pupils equal, round, reactive to light and accommodation. No scleral icterus. Extraocular muscles intact.  HEENT: Head atraumatic, normocephalic. Oropharynx and nasopharynx clear.  NECK:  Supple, no jugular venous distention. No thyroid enlargement, no tenderness.  LUNGS diminished air entry bilaterally cARDIOVASCULAR: S1, S2 normal. No murmurs, rubs, or gallops.  ABDOMEN: Distended, bowel sounds present EXTREMITIES: No pedal edema, cyanosis, or clubbing.  NEUROLOGIC: Cranial nerves II through XII are intact. Muscle strength 5/5  in all extremities. Sensation intact. Gait not checked.  PSYCHIATRIC: The patient is alert and oriented x 3.  SKIN: No obvious rash, lesion, or ulcer.    LABORATORY PANEL:   CBC Recent Labs  Lab 01/15/18 0500  WBC 3.3*  HGB 8.0*  HCT 28.0*  PLT 120*   ------------------------------------------------------------------------------------------------------------------  Chemistries  Recent Labs  Lab 01/10/18 1930  01/12/18 1424  01/13/18 1517  01/15/18 0407  NA 136   < >  --    < >  --    < > 127*  K 3.7   < > 4.7   < >  --    < > 3.4*  CL 98   < >  --    < >  --    < > 98  CO2 25   < >  --    < >  --    < > 19*  GLUCOSE 85   < >  --    < >  --    < > 675*  BUN 26*   < >  --    < >  --    < > 62*  CREATININE 1.29*   < >  --    < >  --    < > 2.23*  CALCIUM 8.2*   < >  --    < >  --    < > 7.8*  MG  --    < > 2.2  --   --   --   --   AST 46*  --   --   --   --   --   --  ALT 21  --   --   --   --   --   --   ALKPHOS 403*  --   --   --   --   --   --   BILITOT 2.6*  --   --   --  1.6*  --   --    < > = values in this interval not displayed.   ------------------------------------------------------------------------------------------------------------------  Cardiac Enzymes Recent Labs  Lab 01/10/18 1930  TROPONINI <0.03   ------------------------------------------------------------------------------------------------------------------  RADIOLOGY:  US Paracentesis  Result Date: 01/13/2018 INDICATION: Cirrhosis and ascites. EXAM: ULTRASOUND GUIDED PARACENTESIS MEDICATIONS: None. COMPLICATIONS: None immediate. PROCEDURE: Informed written consent was obtained from the patient after a discussion of the risks, benefits and alternatives to treatment. A timeout was performed prior to the initiation of the procedure. Initial ultrasound was performed to localize ascites. The right lower abdomen was prepped and draped in the usual sterile fashion. 1% lidocaine was used for local  anesthesia. Following this, a 6 Fr Safe-T-Centesis catheter was introduced. An ultrasound image was saved for documentation purposes. The paracentesis was performed. The catheter was removed and a dressing was applied. The patient tolerated the procedure well without immediate post procedural complication. FINDINGS: A total of approximately 6 L of yellow fluid was removed. Samples were sent to the laboratory as requested by the clinical team. IMPRESSION: Successful ultrasound-guided paracentesis yielding 6 liters of peritoneal fluid. Electronically Signed   By: Aletta Edouard M.D.   On: 01/13/2018 14:37    EKG:   Orders placed or performed during the hospital encounter of 01/10/18  . EKG 12-Lead  . EKG 12-Lead    ASSESSMENT AND PLAN:  [age]-year-old male patient with history of liver cirrhosis, clear-cell renal cancer comes in because of generalized weakness, body aches and found to have hypothermia, admitted for septic shock, pneumonia in left lung, admitted to ICU.  #1 .sepsis present on admission secondary to pneumonia: Improving, off the BiPAP, continue nasal cannula as needed, continue empiric antibiotics.  To cover SBP, leukocytosis resolved. 2.  Renal cell CA with lung mets, getting immunotherapy.  Appreciate palliative care input.     3.  History of liver cirrhosis, ascites status post paracentesis, 8 L of fluid removed.  Lasix but dose decreased because of worsening renal insufficiency.  Ascitic   fluid cultures are negative for infection.    4.  Diabetes mellitus type 2; uncontrolled, not in DKA, start to ICU for insulin drip, blood sugar very high around 861 yesterday evening.  Patient also has a pseudohyponatremia, should get corrected with insulin infusion, recheck sodium again.  The last one this morning showed 127.   5.  Liver cirrhosis with auto-anticoagulation, INR 1.47.,  Patient is on empiric antibiotics, status post paracentesis, so far acetic fluid cultures did not show any  infection,  6.  Severe malnutrition secondary to cancer, liberalized her diet.  7.  Nausea, vomiting ; better, DC Phenergan, continue fluids, if patient is able to come off the insulin drip can transfer the patient to medical floor .  8.   Acute on chronic abdominal pain,; continue fentanyl patch yesterday, continue oxycodone, IV Dilaudid. 9.  Chronic anemia, chronic disease #10 acute on chronic renal failure, CKD stage III, worsening renal failure, continue IV hydration, consult nephrology.   Prognosis poor, high risk for cardiac arrest Spoke with patient's daughter in ICU today. All the records are reviewed and case discussed with Care Management/Social Workerr. Management plans discussed with the  patient, family and they are in agreement.  CODE STATUS; DNR.  TOTAL TIME TAKING CARE OF THIS PATIENT: 40 minutes.   More than 50% time spent in counseling, coordination of care   Epifanio Lesches M.D on 01/15/2018 at 10:16 AM  Between 7am to 6pm - Pager - [phone]  After 6pm go to [email or web address] - password [email or web address] [facility]  Office  [phone]  CC: Primary care physician; [name], [name], MD   Note: This dictation was prepared with Dragon dictation along with smaller phrase technology. Any transcriptional errors that result from this process are unintentional.

## 2018-01-16 ENCOUNTER — Other Ambulatory Visit: Payer: Medicare HMO

## 2018-01-16 ENCOUNTER — Ambulatory Visit: Payer: Medicare HMO | Admitting: Oncology

## 2018-01-16 ENCOUNTER — Ambulatory Visit: Payer: Medicare HMO

## 2018-01-16 DIAGNOSIS — K7011 Alcoholic hepatitis with ascites: Secondary | ICD-10-CM

## 2018-01-16 DIAGNOSIS — R6521 Severe sepsis with septic shock: Secondary | ICD-10-CM

## 2018-01-16 DIAGNOSIS — A419 Sepsis, unspecified organism: Principal | ICD-10-CM

## 2018-01-16 LAB — GLUCOSE, CAPILLARY
GLUCOSE-CAPILLARY: 86 mg/dL (ref 70–99)
GLUCOSE-CAPILLARY: 163 mg/dL — AB (ref 70–99)
Glucose-Capillary: 120 mg/dL — ABNORMAL HIGH (ref 70–99)
Glucose-Capillary: 135 mg/dL — ABNORMAL HIGH (ref 70–99)
Glucose-Capillary: 187 mg/dL — ABNORMAL HIGH (ref 70–99)
Glucose-Capillary: 55 mg/dL — ABNORMAL LOW (ref 70–99)
Glucose-Capillary: 85 mg/dL (ref 70–99)

## 2018-01-16 LAB — BASIC METABOLIC PANEL
Anion gap: 7 (ref 5–15)
BUN: 57 mg/dL — ABNORMAL HIGH (ref 8–23)
CO2: 22 mmol/L (ref 22–32)
Calcium: 7.9 mg/dL — ABNORMAL LOW (ref 8.9–10.3)
Chloride: 104 mmol/L (ref 98–111)
Creatinine, Ser: 2.31 mg/dL — ABNORMAL HIGH (ref 0.61–1.24)
GFR calc Af Amer: 34 mL/min — ABNORMAL LOW (ref >60)
GFR calc non Af Amer: 29 mL/min — ABNORMAL LOW (ref >60)
Glucose, Bld: 66 mg/dL — ABNORMAL LOW (ref 70–99)
Potassium: 3.3 mmol/L — ABNORMAL LOW (ref 3.5–5.1)
Sodium: 133 mmol/L — ABNORMAL LOW (ref 135–145)

## 2018-01-16 LAB — LIPASE, FLUID: Lipase-Fluid: 3 U/L

## 2018-01-16 MED ORDER — INSULIN DETEMIR 100 UNIT/ML ~~LOC~~ SOLN
9.0000 [IU] | Freq: Every day | SUBCUTANEOUS | Status: DC
  Administered 2018-01-16: 9 [IU] via SUBCUTANEOUS
  Filled 2018-01-16 (×2): qty 0.09

## 2018-01-16 MED ORDER — INSULIN DETEMIR 100 UNIT/ML ~~LOC~~ SOLN
9.0000 [IU] | Freq: Every day | SUBCUTANEOUS | Status: DC
  Filled 2018-01-16: qty 0.09

## 2018-01-16 MED ORDER — INSULIN DETEMIR 100 UNIT/ML ~~LOC~~ SOLN
4.0000 [IU] | Freq: Every day | SUBCUTANEOUS | Status: DC
  Filled 2018-01-16: qty 0.04

## 2018-01-16 MED ORDER — DEXTROSE 50 % IV SOLN
1.0000 | Freq: Once | INTRAVENOUS | Status: AC
  Administered 2018-01-16: 50 mL via INTRAVENOUS
  Filled 2018-01-16: qty 50

## 2018-01-16 NOTE — Progress Notes (Signed)
[place]  Telephone:([phone] [phone]   Name: Rodney Stewart Date: 01/16/2018 MRN: [id number]  DOB: 11-22-1955  Patient Care Team: [name], [name], MD as PCP - General (Internal Medicine)    REASON FOR CONSULTATION: Palliative Care consult requested for this62 y.o.malewith multiple medical problems including stage IV renal cell carcinoma status post left nephrectomy (May 2014) metastatic to lung, advanced decompensated cirrhosis with recurrent ascites, and CKD 3. Patient's RCC is being managed with second line nivolumab. He was hospitalized 10/18/2017 to 10/20/2017 with hepatorenal syndrome.He is now admitted 01/10/18 with sepsis from PNA. He initially required BIPAP due to respiratory failure.Palliative care was consulted to help establish goals of care.  CODE STATUS: DNR  PAST MEDICAL HISTORY: Past Medical History:  Diagnosis Date  . Anemia   . Ascites   . BPH (benign prostatic hyperplasia)   . Cirrhosis ([place])   . CKD (chronic kidney disease) stage 3, GFR 30-59 ml/min (HCC)   . Clear cell renal cell carcinoma ([place]) 2014   Left Nephrectomy.  . Colon polyps   . Diabetes mellitus without complication ([employer])    type 2   . Dyspnea    with exertion  . GERD (gastroesophageal reflux disease)   . History of gout   . History of nephrectomy    Left  . Hypertension   . Neuropathy   . Renal Cancer    Renal Cancer  . Renal cell carcinoma of left kidney (HCC)    mets to lungs  . Umbilical hernia     PAST SURGICAL HISTORY:  Past Surgical History:  Procedure Laterality Date  . BACK SURGERY     ruptured disc 1991   . ENDOBRONCHIAL ULTRASOUND N/A 10/14/2015   Procedure: ENDOBRONCHIAL ULTRASOUND;  Surgeon: [name], MD;  Location: [facility] ORS;  Service: Pulmonary;  Laterality: N/A;  . ESOPHAGOGASTRODUODENOSCOPY (EGD) WITH PROPOFOL N/A 04/15/2017   Procedure: ESOPHAGOGASTRODUODENOSCOPY (EGD) WITH  PROPOFOL;  Surgeon: Jonathon Bellows, MD;  Location: [facility] ENDOSCOPY;  Service: Gastroenterology;  Laterality: N/A;  Screen for esophageal varices  . EYE SURGERY Bilateral    Cataract Extraction with IOL  . IR RADIOLOGIST EVAL & MGMT  10/15/2017  . KNEE SURGERY Right   . NEPHRECTOMY     left kidney 2014 renal cell cancer     HEMATOLOGY/ONCOLOGY HISTORY:  Oncology History   Patient underwent left nephrectomy on May 16, 2012 which revealed a clear cell grade 2 renal cell carcinoma, stage TIIIa, N0, M0. Tumor size of 16 cm. Patient was noted to have renal vein involvement, but no other structures were involved. 0 of 2 lymph nodes were negative for disease. PET scan on October 20, 2015 revealed metastatic disease and patient was initiated on Votrient. This was subsequently discontinued in March 2018 secondary to progression of disease. Patient initiated second line treatment with nivolumab on Monday, April 02, 2016     Metastatic renal cell carcinoma to lung [facility])    Initial Diagnosis    Metastatic renal cell carcinoma to lung ([place])     ALLERGIES:  has No Known Allergies.  MEDICATIONS:  Current Facility-Administered Medications  Medication Dose Route Frequency Provider Last Rate Last Dose  . 0.9 %  sodium chloride infusion   Intravenous Continuous Darel Hong D, NP 50 mL/hr at 01/16/18 0201    . acetaminophen (TYLENOL) tablet 650 mg  650 mg Oral Q6H PRN Saundra Shelling, MD       Or  . acetaminophen (TYLENOL) suppository  650 mg  650 mg Rectal Q6H PRN Saundra Shelling, MD      . allopurinol (ZYLOPRIM) tablet 100 mg  100 mg Oral Daily Pyreddy, Reatha Harps, MD   100 mg at 01/16/18 0851  . aspirin EC tablet 81 mg  81 mg Oral Daily Saundra Shelling, MD   81 mg at 01/16/18 0848  . dextrose 50 % solution 25 mL  25 mL Intravenous PRN Lance Coon, MD      . docusate sodium (COLACE) capsule 100 mg  100 mg Oral BID PRN Pyreddy, Reatha Harps, MD      . feeding supplement (ENSURE ENLIVE) (ENSURE ENLIVE) liquid 237 mL   237 mL Oral TID BM Pyreddy, Pavan, MD   237 mL at 01/16/18 1450  . gabapentin (NEURONTIN) capsule 200 mg  200 mg Oral BID Epifanio Lesches, MD   200 mg at 01/16/18 0849  . HYDROmorphone (DILAUDID) injection 0.5 mg  0.5 mg Intravenous Q4H PRN Lance Coon, MD   0.5 mg at 01/16/18 1049  . insulin aspart (novoLOG) injection 0-9 Units  0-9 Units Subcutaneous Q4H Kasa, Kurian, MD      . insulin detemir (LEVEMIR) injection 9 Units  9 Units Subcutaneous QHS Blakeney, Dana G, NP      . ipratropium-albuterol (DUONEB) 0.5-2.5 (3) MG/3ML nebulizer solution 3 mL  3 mL Nebulization Q6H PRN Tukov-Yual, Magdalene S, NP      . lactulose (CHRONULAC) 10 GM/15ML solution 30 g  30 g Oral BID Saundra Shelling, MD   30 g at 01/16/18 0850  . levothyroxine (SYNTHROID, LEVOTHROID) tablet 75 mcg  75 mcg Oral Q0600 Saundra Shelling, MD   75 mcg at 01/15/18 0614  . multivitamin with minerals tablet 1 tablet  1 tablet Oral Daily Epifanio Lesches, MD   1 tablet at 01/14/18 1054  . ondansetron (ZOFRAN) tablet 4 mg  4 mg Oral Q6H PRN Saundra Shelling, MD       Or  . ondansetron (ZOFRAN) injection 4 mg  4 mg Intravenous Q6H PRN Saundra Shelling, MD   4 mg at 01/16/18 1608  . oxyCODONE (Oxy IR/ROXICODONE) immediate release tablet 5 mg  5 mg Oral Q4H PRN Epifanio Lesches, MD   5 mg at 01/14/18 1452  . pantoprazole (PROTONIX) EC tablet 40 mg  40 mg Oral BID AC Epifanio Lesches, MD   40 mg at 01/16/18 0849  . polyethylene glycol (MIRALAX / GLYCOLAX) packet 17 g  17 g Oral Daily Pyreddy, Pavan, MD   17 g at 01/15/18 1100  . pravastatin (PRAVACHOL) tablet 20 mg  20 mg Oral q1800 Saundra Shelling, MD   20 mg at 01/14/18 1804  . rifaximin (XIFAXAN) tablet 550 mg  550 mg Oral BID AC & HS Saundra Shelling, MD   550 mg at 01/16/18 0852  . tamsulosin (FLOMAX) capsule 0.4 mg  0.4 mg Oral QODAY Pyreddy, Reatha Harps, MD   0.4 mg at 01/15/18 1100    VITAL SIGNS: BP 124/81   Pulse 90   Temp 97.9 F (36.6 C) (Oral)   Resp 14   Ht _0   (1.803 m)   Wt 175 lb 14.8 oz (79.8 kg)   SpO2 99%   BMI 24.54 kg/m  Filed Weights   01/11/18 0038 01/15/18 0354  Weight: 184 lb 11.9 oz (83.8 kg) 175 lb 14.8 oz (79.8 kg)    Estimated body mass index is 24.54 kg/m as calculated from the following:   Height as of this encounter: _1  (1.803 m).   Weight  as of this encounter: 175 lb 14.8 oz (79.8 kg).  LABS: CBC:    Component Value Date/Time   WBC 3.3 (L) 01/15/2018 0500   HGB 8.0 (L) 01/15/2018 0500   HGB 8.2 (L) 04/28/2012 1503   HCT 28.0 (L) 01/15/2018 0500   HCT 26.1 (L) 04/28/2012 1503   PLT 120 (L) 01/15/2018 0500   PLT 357 04/28/2012 1503   MCV 82.8 01/15/2018 0500   MCV 69 (L) 04/28/2012 1503   NEUTROABS 11.0 (H) 01/10/2018 1930   NEUTROABS 4.1 04/28/2012 1503   LYMPHSABS 0.4 (L) 01/10/2018 1930   LYMPHSABS 0.7 (L) 04/28/2012 1503   MONOABS 0.4 01/10/2018 1930   MONOABS 0.6 04/28/2012 1503   EOSABS 0.0 01/10/2018 1930   EOSABS 0.1 04/28/2012 1503   BASOSABS 0.0 01/10/2018 1930   BASOSABS 0.0 04/28/2012 1503   Comprehensive Metabolic Panel:    Component Value Date/Time   NA 133 (L) 01/16/2018 0933   NA 121 (L) 09/24/2017 1458   NA 135 (L) 04/28/2012 1503   K 3.3 (L) 01/16/2018 0933   K 5.0 04/28/2012 1503   CL 104 01/16/2018 0933   CL 101 04/28/2012 1503   CO2 22 01/16/2018 0933   CO2 26 04/28/2012 1503   BUN 57 (H) 01/16/2018 0933   BUN 37 (H) 09/24/2017 1458   BUN 13 04/28/2012 1503   CREATININE 2.31 (H) 01/16/2018 0933   CREATININE 1.49 (H) 04/28/2012 1503   GLUCOSE 66 (L) 01/16/2018 0933   GLUCOSE 239 (H) 04/28/2012 1503   CALCIUM 7.9 (L) 01/16/2018 0933   CALCIUM 8.2 (L) 02/19/2017 1522   AST 46 (H) 01/10/2018 1930   AST 25 04/28/2012 1503   ALT 21 01/10/2018 1930   ALT 18 04/28/2012 1503   ALKPHOS 403 (H) 01/10/2018 1930   ALKPHOS 181 (H) 04/28/2012 1503   BILITOT 1.6 (H) 01/13/2018 1517   BILITOT 3.9 (H) 09/23/2017 1336   BILITOT 0.8 04/28/2012 1503   PROT 8.1 01/10/2018 1930   PROT  5.6 (L) 09/23/2017 1336   PROT 8.4 (H) 04/28/2012 1503   ALBUMIN 2.3 (L) 01/10/2018 1930   ALBUMIN 2.8 (L) 09/23/2017 1336   ALBUMIN 3.1 (L) 04/28/2012 1503    RADIOGRAPHIC STUDIES: US Paracentesis  Result Date: 01/13/2018 INDICATION: Cirrhosis and ascites. EXAM: ULTRASOUND GUIDED PARACENTESIS MEDICATIONS: None. COMPLICATIONS: None immediate. PROCEDURE: Informed written consent was obtained from the patient after a discussion of the risks, benefits and alternatives to treatment. A timeout was performed prior to the initiation of the procedure. Initial ultrasound was performed to localize ascites. The right lower abdomen was prepped and draped in the usual sterile fashion. 1% lidocaine was used for local anesthesia. Following this, a 6 Fr Safe-T-Centesis catheter was introduced. An ultrasound image was saved for documentation purposes. The paracentesis was performed. The catheter was removed and a dressing was applied. The patient tolerated the procedure well without immediate post procedural complication. FINDINGS: A total of approximately 6 L of yellow fluid was removed. Samples were sent to the laboratory as requested by the clinical team. IMPRESSION: Successful ultrasound-guided paracentesis yielding 6 liters of peritoneal fluid. Electronically Signed   By: Aletta Edouard M.D.   On: 01/13/2018 14:37   US Paracentesis  Result Date: 12/27/2017 INDICATION: 64 year old male with alcoholic cirrhosis and recurrent large volume ascites. He presents for scheduled therapeutic large volume paracentesis. EXAM: ULTRASOUND GUIDED  PARACENTESIS MEDICATIONS: None. COMPLICATIONS: None immediate. PROCEDURE: Informed written consent was obtained from the patient after a discussion of the risks, benefits and  alternatives to treatment. A timeout was performed prior to the initiation of the procedure. Initial ultrasound scanning demonstrates a large amount of ascites within the right lower abdominal quadrant. The  right lower abdomen was prepped and draped in the usual sterile fashion. 1% lidocaine with epinephrine was used for local anesthesia. Following this, a 6 Fr Safe-T-Centesis catheter was introduced. An ultrasound image was saved for documentation purposes. The paracentesis was performed. The catheter was removed and a dressing was applied. The patient tolerated the procedure well without immediate post procedural complication. FINDINGS: A total of approximately 5300 mL of yellow ascitic fluid was removed. IMPRESSION: Successful ultrasound-guided paracentesis yielding 5.3 liters of peritoneal fluid. Electronically Signed   By: Jacqulynn Cadet M.D.   On: 12/27/2017 14:20   Dg Chest Port 1 View  Result Date: 01/11/2018 CLINICAL DATA:  Acute respiratory failure. EXAM: PORTABLE CHEST 1 VIEW COMPARISON:  One-view chest x-ray 01/10/2017 FINDINGS: Heart size is normal. Aortic atherosclerosis is again seen. Left basilar airspace disease is similar the prior study. Left effusion is suspected. Minimal atelectasis is present at the right base. IMPRESSION: 1. Similar appearance of left basilar airspace disease, indicating pneumonia. 2. Probable small left pleural effusion. Electronically Signed   By: San Morelle M.D.   On: 01/11/2018 08:13   Dg Chest Port 1 View  Result Date: 01/10/2018 CLINICAL DATA:  Chills and nausea. Previous history of renal cell carcinoma metastatic to lung. EXAM: PORTABLE CHEST 1 VIEW COMPARISON:  12/12/2017 FINDINGS: Shallow inspiration. Heart size and pulmonary vascularity are normal for technique. Suggestion of infiltration or atelectasis in the left lung base. Probable small left pleural effusion as seen previously. Could consider pneumonia in the appropriate clinical setting. No pneumothorax. Calcification of the aorta. Degenerative changes in the shoulders. IMPRESSION: Infiltration or atelectasis in the left lung base with small left pleural effusion. Consider pneumonia in the  appropriate clinical setting. Electronically Signed   By: Lucienne Capers M.D.   On: 01/10/2018 20:00    PERFORMANCE STATUS (ECOG) : 3 - Symptomatic, >50% confined to bed  Review of Systems As noted above. Otherwise, a complete review of systems is negative.  Physical Exam General: frail, thin Cardiovascular: regular rate and rhythm Pulmonary: clear ant fields Abdomen: distended, + fluid wave Extremities: edema Skin: no rashes Neurological: Weakness but otherwise nonfocal  IMPRESSION: Patient remains in ICU. He is more awake today. Currently oriented. Has some occasional pain, improved with IV hydromorphone. Daughter is at bedside.   I met again with patient's daughter. Patient apparently told family/staff overnight that he wanted to die. However, daughter feels patient is much improved today and that the comment was said due to severe pain. She is hopeful that patient will continue to improve.   Family is awaiting the arrival of patient's brother who it sounds like will be instrumental in decision making.   PLAN: Continue current scope of treatment Will follow and talk with brother when he arrives.    Time Total: 20 minutes  Visit consisted of counseling and education dealing with the complex and emotionally intense issues of symptom management and palliative care in the setting of serious and potentially life-threatening illness.Greater than 50%  of this time was spent counseling and coordinating care related to the above assessment and plan.  Signed by: Altha Harm, PhD, NP-C [phone] (Work Cell)

## 2018-01-16 NOTE — Progress Notes (Addendum)
[place] [facility] [place] NAME: Rodney Stewart Surgeon    MR#:  [id number]  DATE OF BIRTH:  07/22/1955  Transfer to ICU yesterday evening for insulin drip due to elevated blood sugar to more than 800.  CHIEF COMPLAINT:   Chief Complaint  Patient presents with  . Chills  . Nausea  Patient seen and evaluated today Blood sugars have come down Patient has been weaned off insulin drip No complaints of any shortness of breath  REVIEW OF SYSTEMS:   ROS CONSTITUTIONAL: No fever, is very poor p.o. intake..  Patient appears very cachectic.  Patient is resting, sleeping sound, daughter is at bedside .  Appears severely cachectic. EARS, NOSE, AND THROAT: No tinnitus or ear pain.  RESPIRATORY: No cough, shortness of breath, wheezing or hemoptysis.  CARDIOVASCULAR: No chest pain, orthopnea, edema.  GASTROINTESTINA symptoms; ; lessabdominal pain. GENITOURINARY: No dysuria, hematuria.  ENDOCRINE: No polyuria, nocturia,  HEMATOLOGY: Anemia, thrombocytopenia due to liver cirrhosis SKIN: No rash or lesion. MUSCULOSKELETAL: No joint pain or arthritis.   NEUROLOGIC: No tingling, numbness, weakness.  PSYCHIATRY: No anxiety or depression.   DRUG ALLERGIES:  No Known Allergies  VITALS:  Blood pressure 124/81, pulse 90, temperature 97.9 F (36.6 C), temperature source Oral, resp. rate 14, height 5\' 11"  (1.803 m), weight 79.8 kg, SpO2 99 %.  PHYSICAL EXAMINATION:  GENERAL:  63 y.o.-year-old patient lying in the bed with no acute distress.  EYES: Pupils equal, round, reactive to light and accommodation. No scleral icterus. Extraocular muscles intact.  HEENT: Head atraumatic, normocephalic. Oropharynx and nasopharynx clear.  NECK:  Supple, no jugular venous distention. No thyroid enlargement, no tenderness.  LUNGS diminished air entry bilaterally cARDIOVASCULAR: S1, S2 normal. No murmurs, rubs, or gallops.  ABDOMEN: Distended, bowel sounds present EXTREMITIES: No  pedal edema, cyanosis, or clubbing.  NEUROLOGIC: Cranial nerves II through XII are intact. Muscle strength 5/5 in all extremities. Sensation intact. Gait not checked.  PSYCHIATRIC: The patient is alert and oriented x 3.  SKIN: No obvious rash, lesion, or ulcer.    LABORATORY PANEL:   CBC Recent Labs  Lab 01/15/18 0500  WBC 3.3*  HGB 8.0*  HCT 28.0*  PLT 120*   ------------------------------------------------------------------------------------------------------------------  Chemistries  Recent Labs  Lab 01/10/18 1930  01/12/18 1424  01/13/18 1517  01/16/18 0933  NA 136   < >  --    < >  --    < > 133*  K 3.7   < > 4.7   < >  --    < > 3.3*  CL 98   < >  --    < >  --    < > 104  CO2 25   < >  --    < >  --    < > 22  GLUCOSE 85   < >  --    < >  --    < > 66*  BUN 26*   < >  --    < >  --    < > 57*  CREATININE 1.29*   < >  --    < >  --    < > 2.31*  CALCIUM 8.2*   < >  --    < >  --    < > 7.9*  MG  --    < > 2.2  --   --   --   --   AST  46*  --   --   --   --   --   --   ALT 21  --   --   --   --   --   --   ALKPHOS 403*  --   --   --   --   --   --   BILITOT 2.6*  --   --   --  1.6*  --   --    < > = values in this interval not displayed.   ------------------------------------------------------------------------------------------------------------------  Cardiac Enzymes Recent Labs  Lab 01/10/18 1930  TROPONINI <0.03   ------------------------------------------------------------------------------------------------------------------  RADIOLOGY:  No results found.  EKG:   Orders placed or performed during the hospital encounter of 01/10/18  . EKG 12-Lead  . EKG 12-Lead    ASSESSMENT AND PLAN:  63 year old male patient with history of liver cirrhosis, clear-cell renal cancer comes in because of generalized weakness, body aches and found to have hypothermia, admitted for septic shock, pneumonia in left lung, admitted to ICU.  1 .sepsis present on  admission secondary to pneumonia: Improving, off the BiPAP, continue nasal cannula as needed, continue empiric antibiotics.  To cover SBP, leukocytosis resolved.  2.  Renal cell CA with lung mets, getting immunotherapy.  Appreciate palliative care input.    3.  History of liver cirrhosis, ascites status post paracentesis, 8 L of fluid removed.  Lasix but dose decreased because of worsening renal insufficiency.  Ascitic  fluid cultures are negative for infection.    4.  Diabetes mellitus type 2; uncontrolled Patient was started on IV insulin drip for elevated blood sugars Blood sugars have came down and IV insulin drip has been weaned off Currently on Levemir insulin twice daily with sliding scale coverage For any hypoglycemia  5.  Liver cirrhosis with auto-anticoagulation, INR 1.47.,  Patient is on empiric antibiotics, status post paracentesis, so far acetic fluid cultures did not show any infection,  6.  Severe malnutrition secondary to cancer Nutritional supplements  7.  Nausea, vomiting antiemetics as needed  8.   Acute on chronic abdominal pain Pain management continue  9.  Chronic anemia, chronic disease  10.  Acute on chronic renal failure, CKD stage III Monitor renal function   11. Prognosis poor, high risk for cardiac arrest  Spoke with patient's daughter in ICU today. All the records are reviewed and case discussed with Care Management/Social Workerr. Management plans discussed with the patient, family and they are in agreement.  CODE STATUS; DNR.  TOTAL TIME TAKING CARE OF THIS PATIENT: 35 minutes.   More than 50% time spent in counseling, coordination of care   Saundra Shelling M.D on 01/16/2018 at 3:28 PM  Between 7am to 6pm - Pager - [phone]  After 6pm go to [email or web address] - password [email or web address] [place] [facility]  Office  [phone]  CC: Primary care physician; [name], [name], MD   Note: This dictation was prepared with Dragon  dictation along with smaller phrase technology. Any transcriptional errors that result from this process are unintentional.

## 2018-01-16 NOTE — Progress Notes (Signed)
Nutrition Follow-up  DOCUMENTATION CODES:   Severe malnutrition in context of chronic illness  INTERVENTION:  Continue Ensure Enlive po TID, each supplement provides 350 kcal and 20 grams of protein.  Continue daily MVI.  NUTRITION DIAGNOSIS:   Severe Malnutrition related to cancer and cancer related treatments as evidenced by severe fat depletion, severe muscle depletion.  Ongoing.  GOAL:   Patient will meet greater than or equal to 90% of their needs  Progressing.  MONITOR:   PO intake, Supplement acceptance, Labs, Weight trends, Skin, I & O's  REASON FOR ASSESSMENT:   Malnutrition Screening Tool    ASSESSMENT:   63 y/o male with h/o etoh abuse, decompensated liver disease, cirrhosis, ascites, metastatic stage IV renal cell carcinoma to lung admitted to the hospital with sepsis and left lower lobe pneumonia.  Patient's decreased appetite and intake continue. He was previously eating around 50% of meals, but now is eating 0-50% of meals. He is drinking 1-2 bottles of Ensure per day. Ongoing discussions regarding goals of care. Patient has been refusing some PO medications and asking to go home. RD will continue to monitor. Patient was on insulin gtt but is now off.  Medications reviewed and include: allopurinol, Novolog 0-9 units Q4hrs, Levemir 9 units QHS, lactulose, levothyroxine, MVI daily, pantoprazole, Miralax, Flomax, NS @ 50 mL/hr.  Labs reviewed: CBG 55-120, Sodium 133, Potassium 3.3, BUN 57, Creatinine 2.31.  Diet Order:   Diet Order            Diet regular Room service appropriate? Yes; Fluid consistency: Thin  Diet effective now             EDUCATION NEEDS:   No education needs have been identified at this time  Skin:  Skin Assessment: Reviewed RN Assessment(ecchymosis)  Last BM:  01/16/2018 - medium type 6  Height:   Ht Readings from Last 1 Encounters:  01/15/18 5\' 11"  (1.803 m)   Weight:   Wt Readings from Last 1 Encounters:  01/15/18  79.8 kg   Ideal Body Weight:  78.2 kg  BMI:  Body mass index is 24.54 kg/m.  Estimated Nutritional Needs:   Kcal:  2000-2300kcal/day   Protein:  102-116g/day   Fluid:  per MD  Willey Blade, MS, RD, LDN Office: [phone] Pager: [phone] After Hours/Weekend Pager: [phone]

## 2018-01-16 NOTE — Progress Notes (Signed)
PT Cancellation Note  Patient Details Name: KONA LOVER MRN: [id number] DOB: [birth year]   Cancelled Treatment:    Reason Eval/Treat Not Completed: Medical issues which prohibited therapy(Patient noted with transfer to CCU due to change in medical status.  Per guidelines, will require new orders to resume PT services.  Please re-consult as medically appropriate.)   Delorean Knutzen H. Owens Shark, PT, DPT, NCS 01/16/18, 12:09 AM [phone]

## 2018-01-16 NOTE — Progress Notes (Signed)
CRITICAL VALUE ALERT  Critical Value: glucose 55  Date & Time Notied: 1145am 01/16/18  Provider Notified: NP Blakeney  Orders Received/Actions taken: 1amp of D50 and levemir decreased to 9 units hs

## 2018-01-16 NOTE — Progress Notes (Signed)
Follow up visit w/ pt that has transitioned to ICU. Pt was in a somber mood yet open to a visit. Talked and conversed w/ family present brother and sister in law who came in from [place]. Pt was responsive but seemed very frail and tired. Presented a compassionate presence w/ pt and family and shared with him that I would follow up later and allow him time to visit w/ his family. Follow up recommended.     01/16/18 1530  Clinical Encounter Type  Visited With Patient and family together  Visit Type Follow-up;Social support  Referral From Chaplain  Consult/Referral To Chaplain  Spiritual Encounters  Spiritual Needs Emotional;Grief support  Stress Factors  Patient Stress Factors Health changes;Major life changes  Family Stress Factors Not reviewed

## 2018-01-16 NOTE — Progress Notes (Signed)
Pt refused all PO meds for the night shift. Did ask for pain medicine this morning about 0530. Pt continues to ask why is he here and states he wants to leave. Pt states at one time "just let me die".

## 2018-01-17 LAB — BODY FLUID CULTURE
Culture: NO GROWTH
GRAM STAIN: NONE SEEN

## 2018-01-17 LAB — GLUCOSE, CAPILLARY
Glucose-Capillary: 186 mg/dL — ABNORMAL HIGH (ref 70–99)
Glucose-Capillary: 160 mg/dL — ABNORMAL HIGH (ref 70–99)
Glucose-Capillary: 174 mg/dL — ABNORMAL HIGH (ref 70–99)
Glucose-Capillary: 241 mg/dL — ABNORMAL HIGH (ref 70–99)
Glucose-Capillary: 262 mg/dL — ABNORMAL HIGH (ref 70–99)

## 2018-01-17 LAB — BASIC METABOLIC PANEL
Anion gap: 6 (ref 5–15)
BUN: 58 mg/dL — ABNORMAL HIGH (ref 8–23)
CO2: 22 mmol/L (ref 22–32)
Calcium: 8 mg/dL — ABNORMAL LOW (ref 8.9–10.3)
Chloride: 105 mmol/L (ref 98–111)
Creatinine, Ser: 2.41 mg/dL — ABNORMAL HIGH (ref 0.61–1.24)
GFR calc Af Amer: 32 mL/min — ABNORMAL LOW (ref >60)
GFR calc non Af Amer: 28 mL/min — ABNORMAL LOW (ref >60)
Glucose, Bld: 186 mg/dL — ABNORMAL HIGH (ref 70–99)
Potassium: 3.5 mmol/L (ref 3.5–5.1)
SODIUM: 133 mmol/L — AB (ref 135–145)

## 2018-01-17 LAB — CBC
HCT: 27.1 % — ABNORMAL LOW (ref 39.0–52.0)
Hemoglobin: 8.1 g/dL — ABNORMAL LOW (ref 13.0–17.0)
MCH: 23.6 pg — AB (ref 26.0–34.0)
MCHC: 29.9 g/dL — ABNORMAL LOW (ref 30.0–36.0)
MCV: 79 fL — ABNORMAL LOW (ref 80.0–100.0)
Platelets: 96 10*3/uL — ABNORMAL LOW (ref 150–400)
RBC: 3.43 MIL/uL — ABNORMAL LOW (ref 4.22–5.81)
RDW: 16.3 % — ABNORMAL HIGH (ref 11.5–15.5)
WBC: 4.5 10*3/uL (ref 4.0–10.5)
nRBC: 0 % (ref 0.0–0.2)

## 2018-01-17 MED ORDER — INSULIN DETEMIR 100 UNIT/ML ~~LOC~~ SOLN
12.0000 [IU] | Freq: Every day | SUBCUTANEOUS | Status: DC
  Administered 2018-01-17 – 2018-01-19 (×3): 12 [IU] via SUBCUTANEOUS
  Filled 2018-01-17 (×4): qty 0.12

## 2018-01-17 NOTE — Progress Notes (Signed)
Physical Therapy Treatment Patient Details Name: Rodney Stewart MRN: [id number] DOB: [birth year] Today's Date: 01/17/2018    History of Present Illness Rodney Stewart is a 63yo male with onset of sepsis PNA, hypothermia from sepsis, requiring paracentesis on 1/13 with removal of 6L fluid. Pt moved to CCU on 1/16 for insulin infusion p sustained hyperglycemia. PMHx:  ascites, DM, CKD, renal cell CA, gout, nephrectomy, HTN, cirrhosis, hyponatremia, low K+,     PT Comments    New order received after transfer to ICU. Pt received in bed this AM, brother in room. Pt agreeable to treatment after mild encouragement from PT/brother. Supervision for bed mobility, ModA for STS and SPT EOB to chair. Dizziness upon sitting up, BP flat supien to sitting, but H&H low (8.1/27.1), dizziness improves slowly with sustained sitting. Tranfers also worsen dizziness. Chair exercises performed for legs and BUE. Noted redness at groin area with adjustment of diaper for mobility, RN made aware, appears erythematous. Pt progressing slowly overall, but limited by dizziness this date. Legs remain very weak.    Follow Up Recommendations  SNF     Equipment Recommendations  None recommended by PT    Recommendations for Other Services       Precautions / Restrictions Precautions Precautions: Fall;Other (comment) Precaution Comments: dizziness with positional changes without BP drop Restrictions Weight Bearing Restrictions: No    Mobility  Bed Mobility Overal bed mobility: Needs Assistance Bed Mobility: Supine to Sit     Supine to sit: Supervision     General bed mobility comments: slow and labored, but able to perform without physical assistance  Transfers Overall transfer level: Needs assistance Equipment used: 1 person hand held assist Transfers: Sit to/from Stand Sit to Stand: Mod assist;From elevated surface         General transfer comment: pt holds PT's elbows, able to take steps for pivot.  Performed 3x total with rest between for dizziness to subside   Ambulation/Gait Ambulation/Gait assistance: (not tolerant to standing long enough to try AMB)               Stairs             Wheelchair Mobility    Modified Rankin (Stroke Patients Only)       Balance Overall balance assessment: Needs assistance Sitting-balance support: Feet supported;Single extremity supported Sitting balance-Leahy Scale: Good     Standing balance support: Bilateral upper extremity supported;During functional activity Standing balance-Leahy Scale: Fair                              Cognition Arousal/Alertness: Awake/alert Behavior During Therapy: WFL for tasks assessed/performed Overall Cognitive Status: Within Functional Limits for tasks assessed                                        Exercises Other Exercises Other Exercises: STS from chair 2x, modA  Other Exercises: LAQ 1x10 bilat Other Exercises: Overhead reach 1x15 bilat Other Exercises: Fisting LUE (edematous): 1x15 Other Exercises: Marching seated (minA) 2x5 bilat, alternating.     General Comments        Pertinent Vitals/Pain Pain Assessment: Faces Faces Pain Scale: Hurts even more Pain Location: pain at the medial proximal thigh junction with perirectal area, visualized, erythmatous, contact with diaper.  Pain Descriptors / Indicators: Aching Pain Intervention(s): Limited activity within patient's tolerance;Monitored during  session;Repositioned(discussed with RN for potential cream or skin care? )    Home Living                      Prior Function            PT Goals (current goals can now be found in the care plan section) Acute Rehab PT Goals Patient Stated Goal: reduce groin pain, regain strength in legs PT Goal Formulation: With patient/family Time For Goal Achievement: 01/09/2018 Potential to Achieve Goals: Fair Progress towards PT goals: Progressing toward  goals    Frequency    Min 2X/week      PT Plan Current plan remains appropriate    Co-evaluation              AM-PAC PT "6 Clicks" Mobility   Outcome Measure  Help needed turning from your back to your side while in a flat bed without using bedrails?: None Help needed moving from lying on your back to sitting on the side of a flat bed without using bedrails?: A Little Help needed moving to and from a bed to a chair (including a wheelchair)?: A Lot Help needed standing up from a chair using your arms (e.g., wheelchair or bedside chair)?: A Lot Help needed to walk in hospital room?: A Lot Help needed climbing 3-5 steps with a railing? : A Lot 6 Click Score: 15    End of Session   Activity Tolerance: Patient limited by fatigue;Patient tolerated treatment well;Patient limited by pain Patient left: with call bell/phone within reach;with family/visitor present;in chair;with SCD's reapplied Nurse Communication: Mobility status;Weight bearing status PT Visit Diagnosis: Unsteadiness on feet (R26.81);Muscle weakness (generalized) (M62.81);Difficulty in walking, not elsewhere classified (R26.2);Adult, failure to thrive (R62.7)     Time: 3825-0539 PT Time Calculation (min) (ACUTE ONLY): 30 min  Charges:  $Therapeutic Exercise: 8-22 mins $Therapeutic Activity: 8-22 mins                     9:47 AM, 01/17/18 Etta Grandchild, PT, DPT Physical Therapist - [facility]  [phone] ([place])     , C 01/17/2018, 9:44 AM

## 2018-01-17 NOTE — Progress Notes (Signed)
Pt transferring to 1C-Rm 106. Report given to [facility], Therapist, sports. Pt alert and oriented. Sinus rhythm. On room air. BM today. Bilateral edema on upper and lower extremities. Swollen scrotum. ABD tight due to ascites. Irritated skin on bottom. One assist to chair.

## 2018-01-17 NOTE — Clinical Social Work Note (Signed)
Patient's brother has spoken with his daughters and they have chosen [facility]. CSW contacted [facility] to notify however the admissions coordinator and the business office rep were already gone for the day. They will begin aetna auth on Monday. Shela Leff MSW,LCSW [phone]

## 2018-01-17 NOTE — Clinical Social Work Note (Signed)
Clinical Social Work Assessment  Patient Details  Name: Rodney Stewart MRN: [id number] Date of Birth: 1955/01/30  Date of referral:  01/17/18               Reason for consult:  Discharge Planning                Permission sought to share information with:    Permission granted to share information::     Name::        Agency::     Relationship::     Contact Information:     Housing/Transportation Living arrangements for the past 2 months:  Single Family Home Source of Information:  Siblings Patient Interpreter Needed:  None Criminal Activity/Legal Involvement Pertinent to Current Situation/Hospitalization:  No - Comment as needed Significant Relationships:  Adult Children, Siblings Lives with:  Self Do you feel safe going back to the place where you live?    Need for family participation in patient care:  Yes (Comment)  Care giving concerns: Patient resides at home alone.   Social Worker assessment / plan:  CSW met with patient's brother:  Rodney Stewart: [phone] this morning. Patient's brother states that he is doubtful that patient will recover from this however, he stated that his daughters are not yet ready accept hospice. He stated that he was going to call the daughters because they are all staying at patient's house and he would let me know what the daughters decide. CSW explained the placement process along with Medicare [employer] taking a long time to respond to SNF requests and he verbalized understanding.   Employment status:    Insurance informationEducational psychologist PT Recommendations:  [place] / Referral to community resources:     Patient/Family's Response to care:  Patient's brother expressed appreciation for CSW assistance.  Patient/Family's Understanding of and Emotional Response to Diagnosis, Current Treatment, and Prognosis:  Patient's brother is a Film/video editor from [place] and is very realistic regarding his brother's  recovery.  Emotional Assessment Appearance:  Appears stated age Attitude/Demeanor/Rapport:    Affect (typically observed):    Orientation:    Alcohol / Substance use:  Not Applicable Psych involvement (Current and /or in the community):  No (Comment)  Discharge Needs  Concerns to be addressed:  Care Coordination Readmission within the last 30 days:  No Current discharge risk:  None Barriers to Discharge:  No Barriers Identified   Shela Leff, LCSW 01/17/2018, 12:17 PM

## 2018-01-17 NOTE — NC FL2 (Signed)
[place] LEVEL OF CARE SCREENING TOOL     IDENTIFICATION  Patient Name: Rodney Stewart Birthdate: [birth year] Sex: male Admission Date (Current Location): 01/10/2018  [place] and [place] Number:  Engineering geologist and Address:  [facility], [address], [place], [place] [address]      Provider Number: [id number]  Attending Physician Name and Address:  Saundra Shelling, MD  Relative Name and Phone Number:       Current Level of Care: Hospital Recommended Level of Care: [place] Prior Approval Number:    Date Approved/Denied:   PASRR Number:    Discharge Plan: SNF    Current Diagnoses: Patient Active Problem List   Diagnosis Date Noted  . Septic shock ([place])   . Pneumonia of left lower lobe due to infectious organism ([place])   . Palliative care encounter   . Acute respiratory failure ([place])   . Sepsis ([place]) 01/10/2018  . Constipation 12/26/2017  . Abnormal gait 12/26/2017  . Type 2 diabetes mellitus with diabetic polyneuropathy, with long-term current use of insulin ([place]) 11/06/2017  . Hypoalbuminemia 10/18/2017  . Hypothyroidism 10/03/2017  . Bilateral leg edema 10/03/2017  . Hydrocele 10/03/2017  . Protein-calorie malnutrition, severe 09/15/2017  . Ascites   . Hyponatremia 05/28/2017  . Colon polyps 04/26/2017  . Umbilical hernia 77/82/4235  . Left inguinal hernia 04/26/2017  . Gout 04/26/2017  . Fatty liver 04/26/2017  . Leukopenia 04/26/2017  . Gallstones 04/26/2017  . Type 2 diabetes mellitus with diabetic neuropathy, unspecified ([place]) 04/26/2017  . CAD (coronary artery disease) 04/26/2017  . Chronic knee pain 04/26/2017  . Protein-calorie malnutrition ([place]) 04/26/2017  . Cirrhosis ([place]) 02/17/2017  . Neutropenia associated with infection ([place])   . Malignant neoplasm of kidney ([place])   . Thrombocytopenia ([place])   . Iron deficiency anemia 01/23/2017  . Goals of care, counseling/discussion 11/27/2016   . BPH (benign prostatic hyperplasia) 10/27/2016  . Effusion of left olecranon bursa 10/27/2016  . Foreign body in left ear 10/27/2016  . Pancytopenia ([place]) 10/27/2016  . Alcohol abuse 08/28/2016  . Nonimmune to hepatitis B virus 08/02/2016  . Metastatic renal cell carcinoma to lung ([place])   . ARF (acute renal failure) ([place]) 09/30/2015  . Type 2 diabetes mellitus with moderate nonproliferative diabetic retinopathy and without macular edema ([place]) 09/17/2014  . Cataract 08/20/2014  . Hypertension 07/02/2014  . CKD (chronic kidney disease) stage 3, GFR 30-59 ml/min (HCC) 11/30/2013  . Single kidney 02/04/2013  . Renal cell carcinoma ([place]) 01/02/2012    Orientation RESPIRATION BLADDER Height & Weight     Self, Place  Normal Continent(incontinent at times) Weight: 175 lb 14.8 oz (79.8 kg) Height:  5\' 11"  (180.3 cm)  BEHAVIORAL SYMPTOMS/MOOD NEUROLOGICAL BOWEL NUTRITION STATUS  (none) (none) Continent(incontinent at times) Diet(regular)  AMBULATORY STATUS COMMUNICATION OF NEEDS Skin   Extensive Assist Verbally Normal                       Personal Care Assistance Level of Assistance  Bathing, Dressing, Feeding Bathing Assistance: Maximum assistance Feeding assistance: Maximum assistance Dressing Assistance: Maximum assistance     Functional Limitations Info             SPECIAL CARE FACTORS FREQUENCY  PT (By licensed PT)                    Contractures Contractures Info: Not present    Additional Factors Info  Code Status  Code Status Info: dnr             Current Medications (01/17/2018):  This is the current hospital active medication list Current Facility-Administered Medications  Medication Dose Route Frequency Provider Last Rate Last Dose  . 0.9 %  sodium chloride infusion   Intravenous Continuous Darel Hong D, NP 50 mL/hr at 01/16/18 0201    . acetaminophen (TYLENOL) tablet 650 mg  650 mg Oral Q6H PRN Saundra Shelling, MD       Or  .  acetaminophen (TYLENOL) suppository 650 mg  650 mg Rectal Q6H PRN Pyreddy, Reatha Harps, MD      . allopurinol (ZYLOPRIM) tablet 100 mg  100 mg Oral Daily Pyreddy, Pavan, MD   100 mg at 01/17/18 1037  . aspirin EC tablet 81 mg  81 mg Oral Daily Pyreddy, Reatha Harps, MD   81 mg at 01/17/18 1054  . dextrose 50 % solution 25 mL  25 mL Intravenous PRN Lance Coon, MD      . docusate sodium (COLACE) capsule 100 mg  100 mg Oral BID PRN Pyreddy, Reatha Harps, MD      . feeding supplement (ENSURE ENLIVE) (ENSURE ENLIVE) liquid 237 mL  237 mL Oral TID BM Pyreddy, Pavan, MD   237 mL at 01/17/18 1100  . gabapentin (NEURONTIN) capsule 200 mg  200 mg Oral BID Epifanio Lesches, MD   200 mg at 01/17/18 1037  . HYDROmorphone (DILAUDID) injection 0.5 mg  0.5 mg Intravenous Q4H PRN Lance Coon, MD   0.5 mg at 01/17/18 0549  . insulin aspart (novoLOG) injection 0-9 Units  0-9 Units Subcutaneous Q4H Flora Lipps, MD   2 Units at 01/17/18 0758  . insulin detemir (LEVEMIR) injection 12 Units  12 Units Subcutaneous QHS Rosine Door, MD      . ipratropium-albuterol (DUONEB) 0.5-2.5 (3) MG/3ML nebulizer solution 3 mL  3 mL Nebulization Q6H PRN Tukov-Yual, Magdalene S, NP      . lactulose (CHRONULAC) 10 GM/15ML solution 30 g  30 g Oral BID Saundra Shelling, MD   30 g at 01/17/18 1054  . levothyroxine (SYNTHROID, LEVOTHROID) tablet 75 mcg  75 mcg Oral Q0600 Saundra Shelling, MD   75 mcg at 01/17/18 0537  . multivitamin with minerals tablet 1 tablet  1 tablet Oral Daily Epifanio Lesches, MD   1 tablet at 01/14/18 1054  . ondansetron (ZOFRAN) tablet 4 mg  4 mg Oral Q6H PRN Saundra Shelling, MD       Or  . ondansetron (ZOFRAN) injection 4 mg  4 mg Intravenous Q6H PRN Saundra Shelling, MD   4 mg at 01/16/18 1608  . oxyCODONE (Oxy IR/ROXICODONE) immediate release tablet 5 mg  5 mg Oral Q4H PRN Epifanio Lesches, MD   5 mg at 01/14/18 1452  . pantoprazole (PROTONIX) EC tablet 40 mg  40 mg Oral BID AC Epifanio Lesches, MD   40 mg at  01/17/18 0759  . polyethylene glycol (MIRALAX / GLYCOLAX) packet 17 g  17 g Oral Daily Pyreddy, Pavan, MD   17 g at 01/17/18 1057  . pravastatin (PRAVACHOL) tablet 20 mg  20 mg Oral q1800 Saundra Shelling, MD   20 mg at 01/14/18 1804  . rifaximin (XIFAXAN) tablet 550 mg  550 mg Oral BID AC & HS Pyreddy, Reatha Harps, MD   550 mg at 01/17/18 0759  . tamsulosin (FLOMAX) capsule 0.4 mg  0.4 mg Oral Wylie Hail, MD   0.4 mg at 01/17/18 1057     Discharge Medications:  Please see discharge summary for a list of discharge medications.  Relevant Imaging Results:  Relevant Lab Results:   Additional Information ss: [id number]  Shela Leff, LCSW

## 2018-01-17 NOTE — Clinical Social Work Note (Signed)
CSW spoke with patient's daughter, Belenda Cruise, in patient's room and she confirmed that the family wishes to pursue short term rehab. CSW explained the placement process and that two facilities have offered. CSW is awaiting other bed offers. Once a facility is chosen, the facility will begin [employer]. Shela Leff MSW,LCSW [phone]

## 2018-01-17 NOTE — Progress Notes (Signed)
[place] [facility] [place] NAME: Rodney Stewart    MR#:  [id number]  DATE OF BIRTH:  22-Apr-1955  Transfer to ICU yesterday evening for insulin drip due to elevated blood sugar to more than 800.  CHIEF COMPLAINT:   Chief Complaint  Patient presents with  . Chills  . Nausea  Patient seen and evaluated today Blood sugars have been stable Has poor appetite Generalized weakness Patient has been weaned off insulin drip No complaints of any shortness of breath  REVIEW OF SYSTEMS:   ROS CONSTITUTIONAL: No fever, is very poor p.o. intake..  Patient appears very cachectic.  Patient is resting, sleeping sound, son is at bedside .  Appears severely cachectic. EARS, NOSE, AND THROAT: No tinnitus or ear pain.  RESPIRATORY: No cough, shortness of breath, wheezing or hemoptysis.  CARDIOVASCULAR: No chest pain, orthopnea, edema.  GASTROINTESTINA symptoms; ; lessabdominal pain. GENITOURINARY: No dysuria, hematuria.  ENDOCRINE: No polyuria, nocturia,  HEMATOLOGY: Anemia, thrombocytopenia due to liver cirrhosis SKIN: No rash or lesion. MUSCULOSKELETAL: No joint pain or arthritis.   NEUROLOGIC: No tingling, numbness, weakness.  PSYCHIATRY: No anxiety or depression.   DRUG ALLERGIES:  No Known Allergies  VITALS:  Blood pressure 126/84, pulse 79, temperature 97.8 F (36.6 C), temperature source Axillary, resp. rate 10, height 5\' 11"  (1.803 m), weight 79.8 kg, SpO2 99 %.  PHYSICAL EXAMINATION:  GENERAL:  63 y.o.-year-old patient lying in the bed with no acute distress.  EYES: Pupils equal, round, reactive to light and accommodation. No scleral icterus. Extraocular muscles intact.  HEENT: Head atraumatic, normocephalic. Oropharynx and nasopharynx clear.  NECK:  Supple, no jugular venous distention. No thyroid enlargement, no tenderness.  LUNGS diminished air entry bilaterally CARDIOVASCULAR: S1, S2 normal. No murmurs, rubs, or gallops.  ABDOMEN:  Distended, bowel sounds present EXTREMITIES: No pedal edema, cyanosis, or clubbing.  NEUROLOGIC: Cranial nerves II through XII are intact. Muscle strength 5/5 in all extremities. Sensation intact. Gait not checked.  PSYCHIATRIC: The patient is alert and oriented x 3.  SKIN: No obvious rash, lesion, or ulcer.    LABORATORY PANEL:   CBC Recent Labs  Lab 01/17/18 0519  WBC 4.5  HGB 8.1*  HCT 27.1*  PLT 96*   ------------------------------------------------------------------------------------------------------------------  Chemistries  Recent Labs  Lab 01/10/18 1930  01/12/18 1424  01/13/18 1517  01/17/18 0519  NA 136   < >  --    < >  --    < > 133*  K 3.7   < > 4.7   < >  --    < > 3.5  CL 98   < >  --    < >  --    < > 105  CO2 25   < >  --    < >  --    < > 22  GLUCOSE 85   < >  --    < >  --    < > 186*  BUN 26*   < >  --    < >  --    < > 58*  CREATININE 1.29*   < >  --    < >  --    < > 2.41*  CALCIUM 8.2*   < >  --    < >  --    < > 8.0*  MG  --    < > 2.2  --   --   --   --  AST 46*  --   --   --   --   --   --   ALT 21  --   --   --   --   --   --   ALKPHOS 403*  --   --   --   --   --   --   BILITOT 2.6*  --   --   --  1.6*  --   --    < > = values in this interval not displayed.   ------------------------------------------------------------------------------------------------------------------  Cardiac Enzymes Recent Labs  Lab 01/10/18 1930  TROPONINI <0.03   ------------------------------------------------------------------------------------------------------------------  RADIOLOGY:  No results found.  EKG:   Orders placed or performed during the hospital encounter of 01/10/18  . EKG 12-Lead  . EKG 12-Lead    ASSESSMENT AND PLAN:  63 year old male patient with history of liver cirrhosis, clear-cell renal cancer comes in because of generalized weakness, body aches and found to have hypothermia, admitted for septic shock, pneumonia in left lung,  admitted to ICU.  1 .sepsis present on admission secondary to pneumonia: Improved, off the BiPAP, continue nasal cannula as needed, continue empiric antibiotics.  To cover SBP, leukocytosis resolved.  2.  Renal cell CA with lung mets, getting immunotherapy.  Appreciate palliative care input.   3.  History of liver cirrhosis, ascites status post paracentesis, 8 L of fluid removed.  Lasix on board for diuresis.  Ascitic  fluid cultures are negative for infection.    4.  Diabetes mellitus type 2; uncontrolled Patient was started on IV insulin drip for elevated blood sugars Blood sugars have came down and IV insulin drip has been weaned off Currently on Levemir insulin twice daily with sliding scale coverage For any hypoglycemia  5.  Liver cirrhosis with auto-anticoagulation, INR 1.47.,  Patient is on empiric antibiotics, status post paracentesis, so far acetic fluid cultures did not show any infection,  6.  Severe malnutrition secondary to cancer Nutritional supplements  7.  Nausea, vomiting antiemetics as needed  8.   Acute on chronic abdominal pain Pain management continue  9.  Chronic anemia, chronic disease  10.  Acute on chronic renal failure, CKD stage III Monitor renal function   11. Prognosis poor, high risk for cardiac arrest  12.  Discussed with patient brother medical condition treatment plan SNF placement versus home with hospice services once family decides Social worker follow-up All the records are reviewed and case discussed with Care Management/Social Workerr. Management plans discussed with the patient, family and they are in agreement.  CODE STATUS; DNR.  TOTAL TIME TAKING CARE OF THIS PATIENT: 35 minutes.   More than 50% time spent in counseling, coordination of care   Saundra Shelling M.D on 01/17/2018 at 1:07 PM  Between 7am to 6pm - Pager - [phone]  After 6pm go to [email or web address] - password [email or web address] [facility]  Office   [phone]  CC: Primary care physician; [name], [name], MD   Note: This dictation was prepared with Dragon dictation along with smaller phrase technology. Any transcriptional errors that result from this process are unintentional.

## 2018-01-17 NOTE — Progress Notes (Signed)
   01/17/18 1900  Clinical Encounter Type  Visited With Patient and family together  Visit Type Follow-up  Referral From Nurse  Ch made a follow-up visit. Family were present. Ch informed the family of chaplain services.

## 2018-01-17 NOTE — Progress Notes (Signed)
[place]  [phone] [phone]   Name: Rodney Stewart Date: 01/17/2018 MRN: [id number]  DOB: 25-Dec-1955  Patient Care Team: [name], [name], MD as PCP - General (Internal Medicine)    REASON FOR CONSULTATION: Palliative Care consult requested for this63 y.o.malewith multiple medical problems including stage IV renal cell carcinoma status post left nephrectomy (May 2014) metastatic to lung, advanced decompensated cirrhosis with recurrent ascites, and CKD 3. Patient's RCC is being managed with second line nivolumab. He was hospitalized 10/18/2017 to 10/20/2017 with hepatorenal syndrome.He is now admitted 01/10/18 with sepsis from PNA. He initially required BIPAP due to respiratory failure.Palliative care was consulted to help establish goals of care.  CODE STATUS: DNR  PAST MEDICAL HISTORY: Past Medical History:  Diagnosis Date  . Anemia   . Ascites   . BPH (benign prostatic hyperplasia)   . Cirrhosis ([place])   . CKD (chronic kidney disease) stage 3, GFR 30-59 ml/min (HCC)   . Clear cell renal cell carcinoma ([place]) 2014   Left Nephrectomy.  . Colon polyps   . Diabetes mellitus without complication ([place])    type 2   . Dyspnea    with exertion  . GERD (gastroesophageal reflux disease)   . History of gout   . History of nephrectomy    Left  . Hypertension   . Neuropathy   . Renal Cancer    Renal Cancer  . Renal cell carcinoma of left kidney (HCC)    mets to lungs  . Umbilical hernia     PAST SURGICAL HISTORY:  Past Surgical History:  Procedure Laterality Date  . BACK SURGERY     ruptured disc 1991   . ENDOBRONCHIAL ULTRASOUND N/A 10/14/2015   Procedure: ENDOBRONCHIAL ULTRASOUND;  Surgeon: [name], MD;  Location: [facility] ORS;  Service: Pulmonary;  Laterality: N/A;  . ESOPHAGOGASTRODUODENOSCOPY (EGD) WITH PROPOFOL N/A 04/15/2017   Procedure: ESOPHAGOGASTRODUODENOSCOPY (EGD) WITH  PROPOFOL;  Surgeon: Jonathon Bellows, MD;  Location: [facility] ENDOSCOPY;  Service: Gastroenterology;  Laterality: N/A;  Screen for esophageal varices  . EYE SURGERY Bilateral    Cataract Extraction with IOL  . IR RADIOLOGIST EVAL & MGMT  10/15/2017  . KNEE SURGERY Right   . NEPHRECTOMY     left kidney 2014 renal cell cancer     HEMATOLOGY/ONCOLOGY HISTORY:  Oncology History   Patient underwent left nephrectomy on May 16, 2012 which revealed a clear cell grade 2 renal cell carcinoma, stage TIIIa, N0, M0. Tumor size of 16 cm. Patient was noted to have renal vein involvement, but no other structures were involved. 0 of 2 lymph nodes were negative for disease. PET scan on October 20, 2015 revealed metastatic disease and patient was initiated on Votrient. This was subsequently discontinued in March 2018 secondary to progression of disease. Patient initiated second line treatment with nivolumab on Monday, April 02, 2016     Metastatic renal cell carcinoma to lung [facility])    Initial Diagnosis    Metastatic renal cell carcinoma to lung ([place])     ALLERGIES:  has No Known Allergies.  MEDICATIONS:  Current Facility-Administered Medications  Medication Dose Route Frequency Provider Last Rate Last Dose  . 0.9 %  sodium chloride infusion   Intravenous Continuous Darel Hong D, NP 50 mL/hr at 01/17/18 1239    . acetaminophen (TYLENOL) tablet 650 mg  650 mg Oral Q6H PRN Saundra Shelling, MD       Or  . acetaminophen (TYLENOL) suppository  650 mg  650 mg Rectal Q6H PRN Saundra Shelling, MD      . allopurinol (ZYLOPRIM) tablet 100 mg  100 mg Oral Daily Pyreddy, Pavan, MD   100 mg at 01/17/18 1037  . aspirin EC tablet 81 mg  81 mg Oral Daily Pyreddy, Reatha Harps, MD   81 mg at 01/17/18 1054  . dextrose 50 % solution 25 mL  25 mL Intravenous PRN Lance Coon, MD      . docusate sodium (COLACE) capsule 100 mg  100 mg Oral BID PRN Pyreddy, Reatha Harps, MD      . feeding supplement (ENSURE ENLIVE) (ENSURE ENLIVE) liquid 237 mL   237 mL Oral TID BM Pyreddy, Pavan, MD   237 mL at 01/17/18 1328  . gabapentin (NEURONTIN) capsule 200 mg  200 mg Oral BID Epifanio Lesches, MD   200 mg at 01/17/18 1037  . HYDROmorphone (DILAUDID) injection 0.5 mg  0.5 mg Intravenous Q4H PRN Lance Coon, MD   0.5 mg at 01/17/18 0549  . insulin aspart (novoLOG) injection 0-9 Units  0-9 Units Subcutaneous Q4H Flora Lipps, MD   2 Units at 01/17/18 1200  . insulin detemir (LEVEMIR) injection 12 Units  12 Units Subcutaneous QHS Rosine Door, MD      . ipratropium-albuterol (DUONEB) 0.5-2.5 (3) MG/3ML nebulizer solution 3 mL  3 mL Nebulization Q6H PRN Tukov-Yual, Magdalene S, NP      . lactulose (CHRONULAC) 10 GM/15ML solution 30 g  30 g Oral BID Saundra Shelling, MD   30 g at 01/17/18 1054  . levothyroxine (SYNTHROID, LEVOTHROID) tablet 75 mcg  75 mcg Oral Q0600 Saundra Shelling, MD   75 mcg at 01/17/18 0537  . multivitamin with minerals tablet 1 tablet  1 tablet Oral Daily Epifanio Lesches, MD   1 tablet at 01/17/18 1200  . ondansetron (ZOFRAN) tablet 4 mg  4 mg Oral Q6H PRN Saundra Shelling, MD       Or  . ondansetron (ZOFRAN) injection 4 mg  4 mg Intravenous Q6H PRN Saundra Shelling, MD   4 mg at 01/16/18 1608  . oxyCODONE (Oxy IR/ROXICODONE) immediate release tablet 5 mg  5 mg Oral Q4H PRN Epifanio Lesches, MD   5 mg at 01/14/18 1452  . pantoprazole (PROTONIX) EC tablet 40 mg  40 mg Oral BID AC Epifanio Lesches, MD   40 mg at 01/17/18 0759  . polyethylene glycol (MIRALAX / GLYCOLAX) packet 17 g  17 g Oral Daily Pyreddy, Pavan, MD   17 g at 01/17/18 1057  . pravastatin (PRAVACHOL) tablet 20 mg  20 mg Oral q1800 Saundra Shelling, MD   20 mg at 01/14/18 1804  . rifaximin (XIFAXAN) tablet 550 mg  550 mg Oral BID AC & HS Pyreddy, Reatha Harps, MD   550 mg at 01/17/18 0759  . tamsulosin (FLOMAX) capsule 0.4 mg  0.4 mg Oral QODAY Pyreddy, Reatha Harps, MD   0.4 mg at 01/17/18 1057    VITAL SIGNS: BP 114/75   Pulse 79   Temp 97.8 F (36.6 C) (Axillary)    Resp (!) 9   Ht 5' 11"  (1.803 m)   Wt 175 lb 14.8 oz (79.8 kg)   SpO2 100%   BMI 24.54 kg/m  Filed Weights   01/11/18 0038 01/15/18 0354  Weight: 184 lb 11.9 oz (83.8 kg) 175 lb 14.8 oz (79.8 kg)    Estimated body mass index is 24.54 kg/m as calculated from the following:   Height as of this encounter: 5' 11"  (1.803 m).  Weight as of this encounter: 175 lb 14.8 oz (79.8 kg).  LABS: CBC:    Component Value Date/Time   WBC 4.5 01/17/2018 0519   HGB 8.1 (L) 01/17/2018 0519   HGB 8.2 (L) 04/28/2012 1503   HCT 27.1 (L) 01/17/2018 0519   HCT 26.1 (L) 04/28/2012 1503   PLT 96 (L) 01/17/2018 0519   PLT 357 04/28/2012 1503   MCV 79.0 (L) 01/17/2018 0519   MCV 69 (L) 04/28/2012 1503   NEUTROABS 11.0 (H) 01/10/2018 1930   NEUTROABS 4.1 04/28/2012 1503   LYMPHSABS 0.4 (L) 01/10/2018 1930   LYMPHSABS 0.7 (L) 04/28/2012 1503   MONOABS 0.4 01/10/2018 1930   MONOABS 0.6 04/28/2012 1503   EOSABS 0.0 01/10/2018 1930   EOSABS 0.1 04/28/2012 1503   BASOSABS 0.0 01/10/2018 1930   BASOSABS 0.0 04/28/2012 1503   Comprehensive Metabolic Panel:    Component Value Date/Time   NA 133 (L) 01/17/2018 0519   NA 121 (L) 09/24/2017 1458   NA 135 (L) 04/28/2012 1503   K 3.5 01/17/2018 0519   K 5.0 04/28/2012 1503   CL 105 01/17/2018 0519   CL 101 04/28/2012 1503   CO2 22 01/17/2018 0519   CO2 26 04/28/2012 1503   BUN 58 (H) 01/17/2018 0519   BUN 37 (H) 09/24/2017 1458   BUN 13 04/28/2012 1503   CREATININE 2.41 (H) 01/17/2018 0519   CREATININE 1.49 (H) 04/28/2012 1503   GLUCOSE 186 (H) 01/17/2018 0519   GLUCOSE 239 (H) 04/28/2012 1503   CALCIUM 8.0 (L) 01/17/2018 0519   CALCIUM 8.2 (L) 02/19/2017 1522   AST 46 (H) 01/10/2018 1930   AST 25 04/28/2012 1503   ALT 21 01/10/2018 1930   ALT 18 04/28/2012 1503   ALKPHOS 403 (H) 01/10/2018 1930   ALKPHOS 181 (H) 04/28/2012 1503   BILITOT 1.6 (H) 01/13/2018 1517   BILITOT 3.9 (H) 09/23/2017 1336   BILITOT 0.8 04/28/2012 1503   PROT 8.1  01/10/2018 1930   PROT 5.6 (L) 09/23/2017 1336   PROT 8.4 (H) 04/28/2012 1503   ALBUMIN 2.3 (L) 01/10/2018 1930   ALBUMIN 2.8 (L) 09/23/2017 1336   ALBUMIN 3.1 (L) 04/28/2012 1503    RADIOGRAPHIC STUDIES: US Paracentesis  Result Date: 01/13/2018 INDICATION: Cirrhosis and ascites. EXAM: ULTRASOUND GUIDED PARACENTESIS MEDICATIONS: None. COMPLICATIONS: None immediate. PROCEDURE: Informed written consent was obtained from the patient after a discussion of the risks, benefits and alternatives to treatment. A timeout was performed prior to the initiation of the procedure. Initial ultrasound was performed to localize ascites. The right lower abdomen was prepped and draped in the usual sterile fashion. 1% lidocaine was used for local anesthesia. Following this, a 6 Fr Safe-T-Centesis catheter was introduced. An ultrasound image was saved for documentation purposes. The paracentesis was performed. The catheter was removed and a dressing was applied. The patient tolerated the procedure well without immediate post procedural complication. FINDINGS: A total of approximately 6 L of yellow fluid was removed. Samples were sent to the laboratory as requested by the clinical team. IMPRESSION: Successful ultrasound-guided paracentesis yielding 6 liters of peritoneal fluid. Electronically Signed   By: Aletta Edouard M.D.   On: 01/13/2018 14:37   US Paracentesis  Result Date: 12/27/2017 INDICATION: 63 year old male with alcoholic cirrhosis and recurrent large volume ascites. He presents for scheduled therapeutic large volume paracentesis. EXAM: ULTRASOUND GUIDED  PARACENTESIS MEDICATIONS: None. COMPLICATIONS: None immediate. PROCEDURE: Informed written consent was obtained from the patient after a discussion of the risks, benefits and  alternatives to treatment. A timeout was performed prior to the initiation of the procedure. Initial ultrasound scanning demonstrates a large amount of ascites within the right lower  abdominal quadrant. The right lower abdomen was prepped and draped in the usual sterile fashion. 1% lidocaine with epinephrine was used for local anesthesia. Following this, a 6 Fr Safe-T-Centesis catheter was introduced. An ultrasound image was saved for documentation purposes. The paracentesis was performed. The catheter was removed and a dressing was applied. The patient tolerated the procedure well without immediate post procedural complication. FINDINGS: A total of approximately 5300 mL of yellow ascitic fluid was removed. IMPRESSION: Successful ultrasound-guided paracentesis yielding 5.3 liters of peritoneal fluid. Electronically Signed   By: Jacqulynn Cadet M.D.   On: 12/27/2017 14:20   Dg Chest Port 1 View  Result Date: 01/11/2018 CLINICAL DATA:  Acute respiratory failure. EXAM: PORTABLE CHEST 1 VIEW COMPARISON:  One-view chest x-ray 01/10/2017 FINDINGS: Heart size is normal. Aortic atherosclerosis is again seen. Left basilar airspace disease is similar the prior study. Left effusion is suspected. Minimal atelectasis is present at the right base. IMPRESSION: 1. Similar appearance of left basilar airspace disease, indicating pneumonia. 2. Probable small left pleural effusion. Electronically Signed   By: San Morelle M.D.   On: 01/11/2018 08:13   Dg Chest Port 1 View  Result Date: 01/10/2018 CLINICAL DATA:  Chills and nausea. Previous history of renal cell carcinoma metastatic to lung. EXAM: PORTABLE CHEST 1 VIEW COMPARISON:  12/12/2017 FINDINGS: Shallow inspiration. Heart size and pulmonary vascularity are normal for technique. Suggestion of infiltration or atelectasis in the left lung base. Probable small left pleural effusion as seen previously. Could consider pneumonia in the appropriate clinical setting. No pneumothorax. Calcification of the aorta. Degenerative changes in the shoulders. IMPRESSION: Infiltration or atelectasis in the left lung base with small left pleural effusion.  Consider pneumonia in the appropriate clinical setting. Electronically Signed   By: Lucienne Capers M.D.   On: 01/10/2018 20:00    PERFORMANCE STATUS (ECOG) : 3 - Symptomatic, >50% confined to bed  Review of Systems As noted above. Otherwise, a complete review of systems is negative.  Physical Exam General: frail, thin Cardiovascular: regular rate and rhythm on monitor Pulmonary: unlabored Abdomen: distended Extremities: edema Skin: no rashes Neurological: currently sleeping  IMPRESSION: Patient remains in ICU. Serum glucose stable. Now off insulin drip.  I met with patient's brother and daughter. Brother says that family would like for him to try rehab, although they recognize that his rehab potential might be poor. We talked about him being followed by palliative care at rehab and family thought that would be a good idea. In the event that he fails rehab, family say they would then likely take him home with hospice and hire sitters to provide 24/7 care. Brother verbalized feeling that patient was likely nearing end of life.   PLAN: Continue current scope of treatment Likely dispo: rehab with palliative care and then transition to hospice   Time Total: 20 minutes  Visit consisted of counseling and education dealing with the complex and emotionally intense issues of symptom management and palliative care in the setting of serious and potentially life-threatening illness.Greater than 50%  of this time was spent counseling and coordinating care related to the above assessment and plan.  Signed by: Altha Harm, PhD, NP-C [phone] (Work Cell)

## 2018-01-18 LAB — GLUCOSE, CAPILLARY
Glucose-Capillary: 134 mg/dL — ABNORMAL HIGH (ref 70–99)
Glucose-Capillary: 215 mg/dL — ABNORMAL HIGH (ref 70–99)
Glucose-Capillary: 221 mg/dL — ABNORMAL HIGH (ref 70–99)
Glucose-Capillary: 235 mg/dL — ABNORMAL HIGH (ref 70–99)
Glucose-Capillary: 243 mg/dL — ABNORMAL HIGH (ref 70–99)
Glucose-Capillary: 299 mg/dL — ABNORMAL HIGH (ref 70–99)
Glucose-Capillary: 309 mg/dL — ABNORMAL HIGH (ref 70–99)

## 2018-01-18 MED ORDER — ALBUMIN HUMAN 25 % IV SOLN
25.0000 g | Freq: Three times a day (TID) | INTRAVENOUS | Status: AC
  Administered 2018-01-18 – 2018-01-20 (×7): 25 g via INTRAVENOUS
  Filled 2018-01-18 (×8): qty 100

## 2018-01-18 NOTE — Progress Notes (Signed)
[place] [facility] [place] NAME: Rodney Stewart    MR#:  [id number]  DATE OF BIRTH:  27-Nov-1955  Transfer to ICU yesterday evening for insulin drip due to elevated blood sugar to more than 800.  CHIEF COMPLAINT:   Chief Complaint  Patient presents with  . Chills  . Nausea  Patient seen and evaluated today Blood sugars have been stable Has improved appetite Generalized weakness No complaints of any shortness of breath Moved to medical floor  REVIEW OF SYSTEMS:   ROS CONSTITUTIONAL: No fever, is very poor p.o. intake..  Patient appears very cachectic.  Patient is resting, sleeping sound, son is at bedside .  Appears severely cachectic. EARS, NOSE, AND THROAT: No tinnitus or ear pain.  RESPIRATORY: No cough, shortness of breath, wheezing or hemoptysis.  CARDIOVASCULAR: No chest pain, orthopnea, edema.  GASTROINTESTINA symptoms; ; lessabdominal pain. GENITOURINARY: No dysuria, hematuria.  ENDOCRINE: No polyuria, nocturia,  HEMATOLOGY: Anemia, thrombocytopenia due to liver cirrhosis SKIN: No rash or lesion. MUSCULOSKELETAL: No joint pain or arthritis.   NEUROLOGIC: No tingling, numbness, weakness.  PSYCHIATRY: No anxiety or depression.   DRUG ALLERGIES:  No Known Allergies  VITALS:  Blood pressure 120/75, pulse 91, temperature 97.8 F (36.6 C), temperature source Axillary, resp. rate (!) 23, height 5\' 11"  (1.803 m), weight 85 kg, SpO2 97 %.  PHYSICAL EXAMINATION:  GENERAL:  63 y.o.-year-old patient lying in the bed with no acute distress.  EYES: Pupils equal, round, reactive to light and accommodation. No scleral icterus. Extraocular muscles intact.  HEENT: Head atraumatic, normocephalic. Oropharynx and nasopharynx clear.  NECK:  Supple, no jugular venous distention. No thyroid enlargement, no tenderness.  LUNGS diminished air entry bilaterally CARDIOVASCULAR: S1, S2 normal. No murmurs, rubs, or gallops.  ABDOMEN: Distended, bowel  sounds present Hernia noted EXTREMITIES: No pedal edema, cyanosis, or clubbing.  NEUROLOGIC: Cranial nerves II through XII are intact. Muscle strength 5/5 in all extremities. Sensation intact. Gait not checked.  PSYCHIATRIC: The patient is alert and oriented x 3.  SKIN: No obvious rash, lesion, or ulcer.    LABORATORY PANEL:   CBC Recent Labs  Lab 01/17/18 0519  WBC 4.5  HGB 8.1*  HCT 27.1*  PLT 96*   ------------------------------------------------------------------------------------------------------------------  Chemistries  Recent Labs  Lab 01/12/18 1424  01/13/18 1517  01/17/18 0519  NA  --    < >  --    < > 133*  K 4.7   < >  --    < > 3.5  CL  --    < >  --    < > 105  CO2  --    < >  --    < > 22  GLUCOSE  --    < >  --    < > 186*  BUN  --    < >  --    < > 58*  CREATININE  --    < >  --    < > 2.41*  CALCIUM  --    < >  --    < > 8.0*  MG 2.2  --   --   --   --   BILITOT  --   --  1.6*  --   --    < > = values in this interval not displayed.   ------------------------------------------------------------------------------------------------------------------  Cardiac Enzymes No results for input(s): TROPONINI in the last 168 hours. ------------------------------------------------------------------------------------------------------------------  RADIOLOGY:  No  results found.  EKG:   Orders placed or performed during the hospital encounter of 01/10/18  . EKG 12-Lead  . EKG 12-Lead    ASSESSMENT AND PLAN:  63 year old male patient with history of liver cirrhosis, clear-cell renal cancer comes in because of generalized weakness, body aches and found to have hypothermia, admitted for septic shock, pneumonia in left lung, admitted to ICU.  1 .sepsis present on admission secondary to pneumonia has improved, off the BiPAP, continue nasal cannula as needed, continue empiric antibiotics.  To cover SBP, leukocytosis resolved.  2.  Renal cell CA with lung  mets: getting immunotherapy as out patient.  Appreciate palliative care input.    3.  History of liver cirrhosis, ascites status post paracentesis, 8 L of fluid removed.  Lasix on board for diuresis.  Ascitic fluid cultures are negative for infection.   4.  Diabetes mellitus type 2; uncontrolled Blood sugars have came down and IV insulin drip has been weaned off Currently on Levemir insulin twice daily with sliding scale coverage Watch for hypoglycemia  5.  Liver cirrhosis with auto-anticoagulation, INR 1.47.,  Patient is on empiric antibiotics, status post paracentesis, so far acetic fluid cultures did not show any infection,  6.  Severe malnutrition secondary to cancer Nutritional supplements  7.  Nausea, vomiting antiemetics as needed  8.   Acute on chronic abdominal pain Pain management continue  9.  Chronic anemia, chronic disease  10.  Acute on chronic renal failure, CKD stage III Monitor renal function   11. Prognosis poor in view of advanced liver disease  12.  Discussed with patient brother medical condition treatment plan SNF placement f/u Social worker follow-up  All the records are reviewed and case discussed with Care Management/Social Workerr. Management plans discussed with the patient, family and they are in agreement.  CODE STATUS; DNR.  TOTAL TIME TAKING CARE OF THIS PATIENT: 34 minutes.   More than 50% time spent in counseling, coordination of care   Saundra Shelling M.D on 01/18/2018 at 12:49 PM  Between 7am to 6pm - Pager - [phone]  After 6pm go to [email or web address] - password [email or web address] [facility]  Office  [phone]  CC: Primary care physician; [name], [name], MD   Note: This dictation was prepared with Dragon dictation along with smaller phrase technology. Any transcriptional errors that result from this process are unintentional.

## 2018-01-18 NOTE — Progress Notes (Signed)
[facility]  ROUNDING NOTE   Subjective:  Patient well-known to [place] as we follow him for chronic kidney disease as an outpatient. Patient had acute renal failure this admission. Blood sugars were quite high upon admission. Patient also had pneumonia initially.   Objective:  Vital signs in last 24 hours:  Temp:  [97.5 F (36.4 C)-97.8 F (36.6 C)] 97.8 F (36.6 C) (01/18 0504) Pulse Rate:  [79-104] 91 (01/18 0504) Resp:  [8-23] 23 (01/18 0504) BP: (107-126)/(70-87) 120/75 (01/18 0504) SpO2:  [76 %-100 %] 97 % (01/18 0504) Weight:  [85 kg] 85 kg (01/17 2000)  Weight change:  Filed Weights   01/11/18 0038 01/15/18 0354 01/17/18 2000  Weight: 83.8 kg 79.8 kg 85 kg    Intake/Output: I/O last 3 completed shifts: In: 2866.8 [P.O.:560; I.V.:2306.8] Out: 270 [Urine:270]   Intake/Output this shift:  Total I/O In: 240 [P.O.:240] Out: 125 [Urine:125]  Physical Exam: General: No acute distress  Head: Emaciated facial features  Eyes: Anicteric  Neck: Supple, trachea midline  Lungs:  Clear to auscultation, normal effort  Heart: S1S2 no rubs  Abdomen:  Soft, nontender, marked distension  Extremities: 2+ peripheral edema.  Neurologic: Awake, alert, following commands  Skin: No lesions       Basic Metabolic Panel: Recent Labs  Lab 01/12/18 1424  01/14/18 1917 01/15/18 0050 01/15/18 0407 01/16/18 0933 01/17/18 0519  NA  --    < > 127* 125* 127* 133* 133*  K 4.7   < > 4.0 3.5 3.4* 3.3* 3.5  CL  --    < > 97* 96* 98 104 105  CO2  --    < > 17* 19* 19* 22 22  GLUCOSE  --    < > 861* 779* 675* 66* 186*  BUN  --    < > 63* 64* 62* 57* 58*  CREATININE  --    < > 2.12* 2.27* 2.23* 2.31* 2.41*  CALCIUM  --    < > 7.7* 7.7* 7.8* 7.9* 8.0*  MG 2.2  --   --   --   --   --   --    < > = values in this interval not displayed.    Liver Function Tests: Recent Labs  Lab 01/13/18 1517  BILITOT 1.6*   No results for input(s): LIPASE, AMYLASE in the last 168  hours. No results for input(s): AMMONIA in the last 168 hours.  CBC: Recent Labs  Lab 01/15/18 0500 01/17/18 0519  WBC 3.3* 4.5  HGB 8.0* 8.1*  HCT 28.0* 27.1*  MCV 82.8 79.0*  PLT 120* 96*    Cardiac Enzymes: No results for input(s): CKTOTAL, CKMB, CKMBINDEX, TROPONINI in the last 168 hours.  BNP: Invalid input(s): POCBNP  CBG: Recent Labs  Lab 01/17/18 1538 01/17/18 1956 01/18/18 0015 01/18/18 0459 01/18/18 0745  GLUCAP 241* 262* 243* 235* 134*    Microbiology: Results for orders placed or performed during the hospital encounter of 01/10/18  Blood Culture (routine x 2)     Status: None   Collection Time: 01/10/18  7:30 PM  Result Value Ref Range Status   Specimen Description BLOOD LEFT ANTECUBITAL  Final   Special Requests   Final    BOTTLES DRAWN AEROBIC AND ANAEROBIC Blood Culture adequate volume   Culture   Final    NO GROWTH 5 DAYS Performed at [facility], [address], [place], [place] [address]    Report Status 01/15/2018 FINAL  Final  Blood Culture (routine x 2)     Status: None   Collection Time: 01/10/18  7:30 PM  Result Value Ref Range Status   Specimen Description BLOOD BLOOD LEFT FOREARM  Final   Special Requests   Final    BOTTLES DRAWN AEROBIC AND ANAEROBIC Blood Culture adequate volume   Culture   Final    NO GROWTH 5 DAYS Performed at [facility], [address] [place], [place] [address]    Report Status 01/15/2018 FINAL  Final  MRSA PCR Screening     Status: None   Collection Time: 01/11/18  2:21 AM  Result Value Ref Range Status   MRSA by PCR NEGATIVE NEGATIVE Final    Comment:        The GeneXpert MRSA Assay (FDA approved for NASAL specimens only), is one component of a comprehensive MRSA colonization surveillance program. It is not intended to diagnose MRSA infection nor to guide or monitor treatment for MRSA infections. Performed at [facility], [address], [place], [place]  [address]   Body fluid culture     Status: None   Collection Time: 01/13/18  1:52 PM  Result Value Ref Range Status   Specimen Description   Final    PERITONEAL Performed at [facility], [address] [place], [place] [address]    Special Requests   Final    NONE Performed at [facility], [place], [place] [address]    Gram Stain NO WBC SEEN NO ORGANISMS SEEN   Final   Culture   Final    NO GROWTH 3 DAYS Performed at [facility], [place] [address], [place], [place] [address]    Report Status 01/17/2018 FINAL  Final  Urine culture     Status: None   Collection Time: 01/13/18  9:57 PM  Result Value Ref Range Status   Specimen Description   Final    URINE, RANDOM Performed at [facility], [address], [place], [place] [address]    Special Requests   Final    NONE Performed at [facility], [address] [place], [place] [address]    Culture   Final    NO GROWTH Performed at [facility], [place] [address], [place], [place] [address]    Report Status 01/15/2018 FINAL  Final    Coagulation Studies: No results for input(s): LABPROT, INR in the last 72 hours.  Urinalysis: No results for input(s): COLORURINE, LABSPEC, PHURINE, GLUCOSEU, HGBUR, BILIRUBINUR, KETONESUR, PROTEINUR, UROBILINOGEN, NITRITE, LEUKOCYTESUR in the last 72 hours.  Invalid input(s): APPERANCEUR    Imaging: No results found.   Medications:   . sodium chloride 50 mL/hr at 01/17/18 1446   . allopurinol  100 mg Oral Daily  . aspirin EC  81 mg Oral Daily  . feeding supplement (ENSURE ENLIVE)  237 mL Oral TID BM  . gabapentin  200 mg Oral BID  . insulin aspart  0-9 Units Subcutaneous Q4H  . insulin detemir  12 Units Subcutaneous QHS  . lactulose  30 g Oral BID  . levothyroxine  75 mcg Oral Q0600  . multivitamin with minerals  1 tablet Oral Daily  . pantoprazole  40 mg Oral BID AC  . polyethylene glycol  17 g Oral Daily  .  pravastatin  20 mg Oral q1800  . rifaximin  550 mg Oral BID AC & HS  . tamsulosin  0.4 mg Oral QODAY   acetaminophen **  OR** acetaminophen, dextrose, docusate sodium, HYDROmorphone (DILAUDID) injection, ipratropium-albuterol, ondansetron **OR** ondansetron (ZOFRAN) IV, oxyCODONE  Assessment/ Plan:  63 y.o. male with BPH, chronic kidney disease stage III baseline creatinine 1.5, metastatic renal cell carcinoma status post left nephrectomy 05/2012, diabetes mellitus type 2, hypertension, EtoH use, admitted with pneumonia, ARF, ascites, debility.   1.  Acute renal failure. 2.  Chronic kidney disease stage III 3.  Hyponatremia. 4.  Marked ascites/Cirrhosis   Plan: Patient appears to be declining as compared to her most recent outpatient visit.  He has ongoing acute renal failure.  He is maintained on 0.9 normal saline at 50 cc/h.  We will also add albumin 25 g IV every 8 hours.  Patient would make poor dialysis candidate given his underlying metastatic disease and liver disease.  Family considering palliative care as well.  This appears to be appropriate.  Further plan as patient progresses.   LOS: 8 Katiya Fike 1/18/202011:20 AM

## 2018-01-19 LAB — GLUCOSE, CAPILLARY
GLUCOSE-CAPILLARY: 251 mg/dL — AB (ref 70–99)
Glucose-Capillary: 282 mg/dL — ABNORMAL HIGH (ref 70–99)
Glucose-Capillary: 274 mg/dL — ABNORMAL HIGH (ref 70–99)
Glucose-Capillary: 210 mg/dL — ABNORMAL HIGH (ref 70–99)
Glucose-Capillary: 287 mg/dL — ABNORMAL HIGH (ref 70–99)

## 2018-01-19 MED ORDER — FUROSEMIDE 10 MG/ML IJ SOLN
60.0000 mg | Freq: Every day | INTRAMUSCULAR | Status: DC
  Administered 2018-01-19: 13:00:00 60 mg via INTRAVENOUS
  Filled 2018-01-19 (×2): qty 6

## 2018-01-19 NOTE — Progress Notes (Signed)
[facility] NAME: Rodney Stewart    MR#:  [id number]  DATE OF BIRTH:  Aug 13, 1955  Transfer to ICU yesterday evening for insulin drip due to elevated blood sugar to more than 800.  CHIEF COMPLAINT:   Chief Complaint  Patient presents with  . Chills  . Nausea  Patient seen and evaluated today Blood sugars have been a little higher today Has improved appetite Generalized weakness Abdominal distention secondary to ascites  REVIEW OF SYSTEMS:   ROS CONSTITUTIONAL: No fever, improved oral intake..  Patient appears very cachectic.  Patient is resting, sleeping sound, son is at bedside .  Appears severely cachectic. EARS, NOSE, AND THROAT: No tinnitus or ear pain.  RESPIRATORY: No cough, shortness of breath, wheezing or hemoptysis.  CARDIOVASCULAR: No chest pain, orthopnea, edema.  GASTROINTESTINA symptoms; ; lessabdominal pain. GENITOURINARY: No dysuria, hematuria.  ENDOCRINE: No polyuria, nocturia,  HEMATOLOGY: Anemia, thrombocytopenia due to liver cirrhosis SKIN: No rash or lesion. MUSCULOSKELETAL: No joint pain or arthritis.   NEUROLOGIC: No tingling, numbness, weakness.  PSYCHIATRY: No anxiety or depression.   DRUG ALLERGIES:  No Known Allergies  VITALS:  Blood pressure 138/82, pulse 99, temperature 98.6 F (37 C), temperature source Oral, resp. rate 18, height 5\' 11"  (1.803 m), weight 85 kg, SpO2 99 %.  PHYSICAL EXAMINATION:  GENERAL:  63 y.o.-year-old patient lying in the bed with no acute distress.  EYES: Pupils equal, round, reactive to light and accommodation. No scleral icterus. Extraocular muscles intact.  HEENT: Head atraumatic, normocephalic. Oropharynx and nasopharynx clear.  NECK:  Supple, no jugular venous distention. No thyroid enlargement, no tenderness.  LUNGS diminished air entry bilaterally CARDIOVASCULAR: S1, S2 normal. No murmurs, rubs, or gallops.  ABDOMEN: Distended, bowel sounds present Hernia  noted EXTREMITIES: No pedal edema, cyanosis, or clubbing.  NEUROLOGIC: Cranial nerves II through XII are intact. Muscle strength 5/5 in all extremities. Sensation intact. Gait not checked.  PSYCHIATRIC: The patient is alert and oriented x 3.  SKIN: No obvious rash, lesion, or ulcer.    LABORATORY PANEL:   CBC Recent Labs  Lab 01/17/18 0519  WBC 4.5  HGB 8.1*  HCT 27.1*  PLT 96*   ------------------------------------------------------------------------------------------------------------------  Chemistries  Recent Labs  Lab 01/12/18 1424  01/13/18 1517  01/17/18 0519  NA  --    < >  --    < > 133*  K 4.7   < >  --    < > 3.5  CL  --    < >  --    < > 105  CO2  --    < >  --    < > 22  GLUCOSE  --    < >  --    < > 186*  BUN  --    < >  --    < > 58*  CREATININE  --    < >  --    < > 2.41*  CALCIUM  --    < >  --    < > 8.0*  MG 2.2  --   --   --   --   BILITOT  --   --  1.6*  --   --    < > = values in this interval not displayed.   ------------------------------------------------------------------------------------------------------------------  Cardiac Enzymes No results for input(s): TROPONINI in the last 168 hours. ------------------------------------------------------------------------------------------------------------------  RADIOLOGY:  No results found.  EKG:  Orders placed or performed during the hospital encounter of 01/10/18  . EKG 12-Lead  . EKG 12-Lead    ASSESSMENT AND PLAN:  63 year old male patient with history of liver cirrhosis, clear-cell renal cancer comes in because of generalized weakness, body aches and found to have hypothermia, admitted for septic shock, pneumonia in left lung, admitted to ICU.  1 .sepsis present on admission secondary to pneumonia has improved Weaned off oxygen  2.  Metastatic renal cancer Palliative care follow-up  3.  Abdominal ascites with history of cirrhosis of liver status post paracentesis, 8 L of fluid  removed.  Lasix on board for diuresis.  Ascitic fluid cultures are negative for infection. We will repeat paracentesis in a.m. Continue IV albumin infusion   4.  Diabetes mellitus type 2; uncontrolled Blood sugars have came down and IV insulin drip has been weaned off Currently on Levemir insulin once daily with sliding scale coverage with insulin Diabetic diet  5.  Liver cirrhosis with auto-anticoagulation, INR 1.47.,  Patient is on empiric antibiotics, status post paracentesis, so far acetic fluid cultures did not show any infection,  6.  Severe malnutrition secondary to metastatic cancer Nutritional supplements  7.  Lower lower extremity edema Diurese with IV Lasix Monitor renal function electrolytes  8.  Acute on chronic renal failure, CKD stage III Monitor renal function   9. Prognosis poor in view of advanced liver disease  10.  Discussed with patient brother medical condition treatment plan SNF placement f/u Social worker follow-up  All the records are reviewed and case discussed with Care Management/Social Workerr. Management plans discussed with the patient, family and they are in agreement.  CODE STATUS; DNR.  TOTAL TIME TAKING CARE OF THIS PATIENT: 36 minutes.   More than 50% time spent in counseling, coordination of care   Saundra Shelling M.D on 01/19/2018 at 12:08 PM  Between 7am to 6pm - Pager - [phone]  After 6pm go to [email or web address] - password [email or web address] [facility]  Office  [phone]  CC: Primary care physician; [name], [name], MD   Note: This dictation was prepared with Dragon dictation along with smaller phrase technology. Any transcriptional errors that result from this process are unintentional.

## 2018-01-19 NOTE — Progress Notes (Signed)
[facility]  ROUNDING NOTE   Subjective:  Patient continues to appear quite ill. Significant abdominal distention with lower extremity edema. He was administered albumin yesterday. Last paracentesis was on Monday.   Objective:  Vital signs in last 24 hours:  Temp:  [97.5 F (36.4 C)-98.6 F (37 C)] 98.6 F (37 C) (01/19 0829) Pulse Rate:  [92-99] 99 (01/19 0829) Resp:  [16-18] 18 (01/19 0829) BP: (114-138)/(68-82) 138/82 (01/19 0829) SpO2:  [96 %-99 %] 99 % (01/19 0829)  Weight change:  Filed Weights   01/11/18 0038 01/15/18 0354 01/17/18 2000  Weight: 83.8 kg 79.8 kg 85 kg    Intake/Output: I/O last 3 completed shifts: In: 953.4 [P.O.:720; I.V.:135.2; IV Piggyback:98.2] Out: 600 [Urine:600]   Intake/Output this shift:  Total I/O In: -  Out: 125 [Urine:125]  Physical Exam: General: No acute distress  Head: Emaciated facial features  Eyes: Anicteric  Neck: Supple, trachea midline  Lungs:  Clear to auscultation, normal effort  Heart: S1S2 no rubs  Abdomen:  Soft, nontender, marked distension  Extremities: 2+ peripheral edema.  Neurologic: Awake, alert, following commands  Skin: No lesions       Basic Metabolic Panel: Recent Labs  Lab 01/12/18 1424  01/14/18 1917 01/15/18 0050 01/15/18 0407 01/16/18 0933 01/17/18 0519  NA  --    < > 127* 125* 127* 133* 133*  K 4.7   < > 4.0 3.5 3.4* 3.3* 3.5  CL  --    < > 97* 96* 98 104 105  CO2  --    < > 17* 19* 19* 22 22  GLUCOSE  --    < > 861* 779* 675* 66* 186*  BUN  --    < > 63* 64* 62* 57* 58*  CREATININE  --    < > 2.12* 2.27* 2.23* 2.31* 2.41*  CALCIUM  --    < > 7.7* 7.7* 7.8* 7.9* 8.0*  MG 2.2  --   --   --   --   --   --    < > = values in this interval not displayed.    Liver Function Tests: Recent Labs  Lab 01/13/18 1517  BILITOT 1.6*   No results for input(s): LIPASE, AMYLASE in the last 168 hours. No results for input(s): AMMONIA in the last 168 hours.  CBC: Recent Labs   Lab 01/15/18 0500 01/17/18 0519  WBC 3.3* 4.5  HGB 8.0* 8.1*  HCT 28.0* 27.1*  MCV 82.8 79.0*  PLT 120* 96*    Cardiac Enzymes: No results for input(s): CKTOTAL, CKMB, CKMBINDEX, TROPONINI in the last 168 hours.  BNP: Invalid input(s): POCBNP  CBG: Recent Labs  Lab 01/18/18 2106 01/18/18 2321 01/19/18 0403 01/19/18 0737 01/19/18 1156  GLUCAP 299* 309* 282* 274* 210*    Microbiology: Results for orders placed or performed during the hospital encounter of 01/10/18  Blood Culture (routine x 2)     Status: None   Collection Time: 01/10/18  7:30 PM  Result Value Ref Range Status   Specimen Description BLOOD LEFT ANTECUBITAL  Final   Special Requests   Final    BOTTLES DRAWN AEROBIC AND ANAEROBIC Blood Culture adequate volume   Culture   Final    NO GROWTH 5 DAYS Performed at [facility], [address], [place], [place] [address]    Report Status 01/15/2018 FINAL  Final  Blood Culture (routine x 2)     Status: None   Collection Time: 01/10/18  7:30 PM  Result Value Ref Range Status   Specimen Description BLOOD BLOOD LEFT FOREARM  Final   Special Requests   Final    BOTTLES DRAWN AEROBIC AND ANAEROBIC Blood Culture adequate volume   Culture   Final    NO GROWTH 5 DAYS Performed at [facility], [address] [place], [place] [address]    Report Status 01/15/2018 FINAL  Final  MRSA PCR Screening     Status: None   Collection Time: 01/11/18  2:21 AM  Result Value Ref Range Status   MRSA by PCR NEGATIVE NEGATIVE Final    Comment:        The GeneXpert MRSA Assay (FDA approved for NASAL specimens only), is one component of a comprehensive MRSA colonization surveillance program. It is not intended to diagnose MRSA infection nor to guide or monitor treatment for MRSA infections. Performed at [facility], [address] [place], [place] [address]   Body fluid culture     Status: None   Collection Time: 01/13/18  1:52 PM   Result Value Ref Range Status   Specimen Description   Final    PERITONEAL Performed at [facility], [address] [place], [place] [address]    Special Requests   Final    NONE Performed at [facility], [place], [place] [address]    Gram Stain NO WBC SEEN NO ORGANISMS SEEN   Final   Culture   Final    NO GROWTH 3 DAYS Performed at [facility], [place] [address], [place], [place] [address]    Report Status 01/17/2018 FINAL  Final  Urine culture     Status: None   Collection Time: 01/13/18  9:57 PM  Result Value Ref Range Status   Specimen Description   Final    URINE, RANDOM Performed at [facility], [address] [place], [place] [address]    Special Requests   Final    NONE Performed at [facility], [address] [place], [place] [address]    Culture   Final    NO GROWTH Performed at [facility], [place] [address], [place], [place] [address]    Report Status 01/15/2018 FINAL  Final    Coagulation Studies: No results for input(s): LABPROT, INR in the last 72 hours.  Urinalysis: No results for input(s): COLORURINE, LABSPEC, PHURINE, GLUCOSEU, HGBUR, BILIRUBINUR, KETONESUR, PROTEINUR, UROBILINOGEN, NITRITE, LEUKOCYTESUR in the last 72 hours.  Invalid input(s): APPERANCEUR    Imaging: No results found.   Medications:   . sodium chloride 50 mL/hr at 01/17/18 1446  . albumin human 25 g (01/19/18 0602)   . allopurinol  100 mg Oral Daily  . aspirin EC  81 mg Oral Daily  . feeding supplement (ENSURE ENLIVE)  237 mL Oral TID BM  . furosemide  60 mg Intravenous Daily  . gabapentin  200 mg Oral BID  . insulin aspart  0-9 Units Subcutaneous Q4H  . insulin detemir  12 Units Subcutaneous QHS  . lactulose  30 g Oral BID  . levothyroxine  75 mcg Oral Q0600  . multivitamin with minerals  1 tablet Oral Daily  . pantoprazole  40 mg Oral BID AC  . polyethylene glycol  17 g Oral Daily  .  pravastatin  20 mg Oral q1800  . rifaximin  550 mg Oral BID AC & HS  . tamsulosin  0.4 mg Oral QODAY   acetaminophen **OR**  acetaminophen, dextrose, docusate sodium, HYDROmorphone (DILAUDID) injection, ipratropium-albuterol, ondansetron **OR** ondansetron (ZOFRAN) IV, oxyCODONE  Assessment/ Plan:  63 y.o. male with BPH, chronic kidney disease stage III baseline creatinine 1.5, metastatic renal cell carcinoma status post left nephrectomy 05/2012, diabetes mellitus type 2, hypertension, EtoH use, admitted with pneumonia, ARF, ascites, debility.   1.  Acute renal failure. 2.  Chronic kidney disease stage III 3.  Hyponatremia. 4.  Marked ascites/Cirrhosis   Plan: Overall patient appears to be declining.  He has significant malnutrition in the setting of metastatic renal cell carcinoma, liver cirrhosis, and chronic kidney disease.  He has increasing abdominal distention.  Suggest considering paracentesis tomorrow.  He has considerable lower extremity edema as well.  Therefore we will start the patient back on Lasix 60 mg IV daily and continue albumin 25 g IV every 8 hours.  However overall patient continues have a very guarded prognosis.  Consider palliative/hospice care.   LOS: 9 Vernee Baines 1/19/202011:58 AM

## 2018-01-20 ENCOUNTER — Inpatient Hospital Stay: Payer: Medicare HMO

## 2018-01-20 DIAGNOSIS — R188 Other ascites: Secondary | ICD-10-CM

## 2018-01-20 LAB — GLUCOSE, CAPILLARY
GLUCOSE-CAPILLARY: 201 mg/dL — AB (ref 70–99)
Glucose-Capillary: 197 mg/dL — ABNORMAL HIGH (ref 70–99)
Glucose-Capillary: 224 mg/dL — ABNORMAL HIGH (ref 70–99)
Glucose-Capillary: 228 mg/dL — ABNORMAL HIGH (ref 70–99)
Glucose-Capillary: 234 mg/dL — ABNORMAL HIGH (ref 70–99)
Glucose-Capillary: 266 mg/dL — ABNORMAL HIGH (ref 70–99)
Glucose-Capillary: 292 mg/dL — ABNORMAL HIGH (ref 70–99)
Glucose-Capillary: 345 mg/dL — ABNORMAL HIGH (ref 70–99)

## 2018-01-20 MED ORDER — FUROSEMIDE 40 MG PO TABS
40.0000 mg | ORAL_TABLET | Freq: Every day | ORAL | Status: DC
  Administered 2018-01-21: 09:00:00 40 mg via ORAL
  Filled 2018-01-20: qty 1

## 2018-01-20 MED ORDER — ALPRAZOLAM 1 MG PO TABS
1.0000 mg | ORAL_TABLET | Freq: Every evening | ORAL | Status: DC | PRN
  Administered 2018-01-20: 1 mg via ORAL
  Filled 2018-01-20: qty 1

## 2018-01-20 MED ORDER — POTASSIUM CHLORIDE CRYS ER 20 MEQ PO TBCR
20.0000 meq | EXTENDED_RELEASE_TABLET | Freq: Two times a day (BID) | ORAL | Status: DC
  Administered 2018-01-20 – 2018-01-21 (×3): 20 meq via ORAL
  Filled 2018-01-20 (×3): qty 1

## 2018-01-20 MED ORDER — INSULIN ASPART 100 UNIT/ML ~~LOC~~ SOLN
0.0000 [IU] | Freq: Every day | SUBCUTANEOUS | Status: DC
  Administered 2018-01-20: 22:00:00 2 [IU] via SUBCUTANEOUS
  Filled 2018-01-20: qty 1

## 2018-01-20 MED ORDER — INSULIN ASPART 100 UNIT/ML ~~LOC~~ SOLN
0.0000 [IU] | Freq: Three times a day (TID) | SUBCUTANEOUS | Status: DC
  Administered 2018-01-20: 18:00:00 3 [IU] via SUBCUTANEOUS
  Administered 2018-01-21: 09:00:00 8 [IU] via SUBCUTANEOUS
  Administered 2018-01-21: 13:00:00 5 [IU] via SUBCUTANEOUS
  Filled 2018-01-20 (×3): qty 1

## 2018-01-20 MED ORDER — ALPRAZOLAM 0.5 MG PO TABS
0.5000 mg | ORAL_TABLET | Freq: Once | ORAL | Status: AC
  Administered 2018-01-20: 03:00:00 0.5 mg via ORAL
  Filled 2018-01-20: qty 1

## 2018-01-20 MED ORDER — INSULIN DETEMIR 100 UNIT/ML ~~LOC~~ SOLN
13.0000 [IU] | Freq: Every day | SUBCUTANEOUS | Status: DC
  Administered 2018-01-20: 23:00:00 13 [IU] via SUBCUTANEOUS
  Filled 2018-01-20 (×3): qty 0.13

## 2018-01-20 MED ORDER — LACTULOSE 10 GM/15ML PO SOLN
30.0000 g | Freq: Every day | ORAL | Status: DC
  Filled 2018-01-20: qty 60

## 2018-01-20 NOTE — Progress Notes (Signed)
[place]  Telephone:([phone] [phone]   Name: Rodney Stewart Date: 01/20/2018 MRN: [id number]  DOB: July 31, 1955  Patient Care Team: [name], [name], MD as PCP - General (Internal Medicine)    REASON FOR CONSULTATION: Palliative Care consult requested for this62 y.o.malewith multiple medical problems including stage IV renal cell carcinoma status post left nephrectomy (May 2014) metastatic to lung, advanced decompensated cirrhosis with recurrent ascites, and CKD 3. Patient's RCC is being managed with second line nivolumab. He was hospitalized 10/18/2017 to 10/20/2017 with hepatorenal syndrome.He is now admitted 01/10/18 with sepsis from PNA. He initially required BIPAP due to respiratory failure.Palliative care was consulted to help establish goals of care.  CODE STATUS: DNR  PAST MEDICAL HISTORY: Past Medical History:  Diagnosis Date  . Anemia   . Ascites   . BPH (benign prostatic hyperplasia)   . Cirrhosis ([place])   . CKD (chronic kidney disease) stage 3, GFR 30-59 ml/min (HCC)   . Clear cell renal cell carcinoma ([place]) 2014   Left Nephrectomy.  . Colon polyps   . Diabetes mellitus without complication ([place])    type 2   . Dyspnea    with exertion  . GERD (gastroesophageal reflux disease)   . History of gout   . History of nephrectomy    Left  . Hypertension   . Neuropathy   . Renal Cancer    Renal Cancer  . Renal cell carcinoma of left kidney (HCC)    mets to lungs  . Umbilical hernia     PAST SURGICAL HISTORY:  Past Surgical History:  Procedure Laterality Date  . BACK SURGERY     ruptured disc 1991   . ENDOBRONCHIAL ULTRASOUND N/A 10/14/2015   Procedure: ENDOBRONCHIAL ULTRASOUND;  Surgeon: [name], MD;  Location: [facility] ORS;  Service: Pulmonary;  Laterality: N/A;  . ESOPHAGOGASTRODUODENOSCOPY (EGD) WITH PROPOFOL N/A 04/15/2017   Procedure: ESOPHAGOGASTRODUODENOSCOPY (EGD) WITH  PROPOFOL;  Surgeon: Jonathon Bellows, MD;  Location: [facility] ENDOSCOPY;  Service: Gastroenterology;  Laterality: N/A;  Screen for esophageal varices  . EYE SURGERY Bilateral    Cataract Extraction with IOL  . IR RADIOLOGIST EVAL & MGMT  10/15/2017  . KNEE SURGERY Right   . NEPHRECTOMY     left kidney 2014 renal cell cancer     HEMATOLOGY/ONCOLOGY HISTORY:  Oncology History   Patient underwent left nephrectomy on May 16, 2012 which revealed a clear cell grade 2 renal cell carcinoma, stage TIIIa, N0, M0. Tumor size of 16 cm. Patient was noted to have renal vein involvement, but no other structures were involved. 0 of 2 lymph nodes were negative for disease. PET scan on October 20, 2015 revealed metastatic disease and patient was initiated on Votrient. This was subsequently discontinued in March 2018 secondary to progression of disease. Patient initiated second line treatment with nivolumab on Monday, April 02, 2016     Metastatic renal cell carcinoma to lung [facility])    Initial Diagnosis    Metastatic renal cell carcinoma to lung ([place])     ALLERGIES:  has No Known Allergies.  MEDICATIONS:  Current Facility-Administered Medications  Medication Dose Route Frequency Provider Last Rate Last Dose  . acetaminophen (TYLENOL) tablet 650 mg  650 mg Oral Q6H PRN Saundra Shelling, MD       Or  . acetaminophen (TYLENOL) suppository 650 mg  650 mg Rectal Q6H PRN Pyreddy, Pavan, MD      . albumin human 25 % solution 25  g  25 g Intravenous Q8H Kolluru, Sarath, MD 60 mL/hr at 01/20/18 0516 25 g at 01/20/18 0516  . allopurinol (ZYLOPRIM) tablet 100 mg  100 mg Oral Daily Pyreddy, Reatha Harps, MD   100 mg at 01/20/18 1208  . aspirin EC tablet 81 mg  81 mg Oral Daily Saundra Shelling, MD   81 mg at 01/20/18 1208  . dextrose 50 % solution 25 mL  25 mL Intravenous PRN Lance Coon, MD      . docusate sodium (COLACE) capsule 100 mg  100 mg Oral BID PRN Pyreddy, Reatha Harps, MD      . feeding supplement (ENSURE ENLIVE) (ENSURE ENLIVE)  liquid 237 mL  237 mL Oral TID BM Pyreddy, Pavan, MD   237 mL at 01/19/18 1309  . [START ON 01/21/2018] furosemide (LASIX) tablet 40 mg  40 mg Oral Daily Kolluru, Sarath, MD      . gabapentin (NEURONTIN) capsule 200 mg  200 mg Oral BID Epifanio Lesches, MD   200 mg at 01/20/18 1208  . HYDROmorphone (DILAUDID) injection 0.5 mg  0.5 mg Intravenous Q4H PRN Lance Coon, MD   0.5 mg at 01/20/18 0014  . insulin aspart (novoLOG) injection 0-9 Units  0-9 Units Subcutaneous Q4H Flora Lipps, MD   3 Units at 01/20/18 1219  . insulin detemir (LEVEMIR) injection 12 Units  12 Units Subcutaneous QHS Rosine Door, MD   12 Units at 01/19/18 2123  . ipratropium-albuterol (DUONEB) 0.5-2.5 (3) MG/3ML nebulizer solution 3 mL  3 mL Nebulization Q6H PRN Tukov-Yual, Magdalene S, NP      . [START ON 01/21/2018] lactulose (CHRONULAC) 10 GM/15ML solution 30 g  30 g Oral Daily Pyreddy, Pavan, MD      . levothyroxine (SYNTHROID, LEVOTHROID) tablet 75 mcg  75 mcg Oral K1601 Saundra Shelling, MD   75 mcg at 01/20/18 0502  . multivitamin with minerals tablet 1 tablet  1 tablet Oral Daily Epifanio Lesches, MD   1 tablet at 01/20/18 1208  . ondansetron (ZOFRAN) tablet 4 mg  4 mg Oral Q6H PRN Saundra Shelling, MD       Or  . ondansetron (ZOFRAN) injection 4 mg  4 mg Intravenous Q6H PRN Saundra Shelling, MD   4 mg at 01/16/18 1608  . oxyCODONE (Oxy IR/ROXICODONE) immediate release tablet 5 mg  5 mg Oral Q4H PRN Epifanio Lesches, MD   5 mg at 01/20/18 0207  . pantoprazole (PROTONIX) EC tablet 40 mg  40 mg Oral BID AC Epifanio Lesches, MD   40 mg at 01/20/18 0931  . polyethylene glycol (MIRALAX / GLYCOLAX) packet 17 g  17 g Oral Daily Pyreddy, Pavan, MD   17 g at 01/19/18 1309  . potassium chloride SA (K-DUR,KLOR-CON) CR tablet 20 mEq  20 mEq Oral BID Pyreddy, Reatha Harps, MD      . pravastatin (PRAVACHOL) tablet 20 mg  20 mg Oral q1800 Saundra Shelling, MD   20 mg at 01/19/18 1715  . rifaximin (XIFAXAN) tablet 550 mg  550 mg Oral  BID AC & HS Pyreddy, Reatha Harps, MD   550 mg at 01/20/18 0931  . tamsulosin (FLOMAX) capsule 0.4 mg  0.4 mg Oral QODAY Pyreddy, Reatha Harps, MD   0.4 mg at 01/19/18 0831    VITAL SIGNS: BP 117/67 (BP Location: Left Arm)   Pulse 93   Temp 97.7 F (36.5 C)   Resp 19   Ht 5\' 11"  (1.803 m)   Wt 187 lb 8 oz (85 kg)   SpO2 98%  BMI 26.15 kg/m  Filed Weights   01/11/18 0038 01/15/18 0354 01/17/18 2000  Weight: 184 lb 11.9 oz (83.8 kg) 175 lb 14.8 oz (79.8 kg) 187 lb 8 oz (85 kg)    Estimated body mass index is 26.15 kg/m as calculated from the following:   Height as of this encounter: 5\' 11"  (1.803 m).   Weight as of this encounter: 187 lb 8 oz (85 kg).  LABS: CBC:    Component Value Date/Time   WBC 4.5 01/17/2018 0519   HGB 8.1 (L) 01/17/2018 0519   HGB 8.2 (L) 04/28/2012 1503   HCT 27.1 (L) 01/17/2018 0519   HCT 26.1 (L) 04/28/2012 1503   PLT 96 (L) 01/17/2018 0519   PLT 357 04/28/2012 1503   MCV 79.0 (L) 01/17/2018 0519   MCV 69 (L) 04/28/2012 1503   NEUTROABS 11.0 (H) 01/10/2018 1930   NEUTROABS 4.1 04/28/2012 1503   LYMPHSABS 0.4 (L) 01/10/2018 1930   LYMPHSABS 0.7 (L) 04/28/2012 1503   MONOABS 0.4 01/10/2018 1930   MONOABS 0.6 04/28/2012 1503   EOSABS 0.0 01/10/2018 1930   EOSABS 0.1 04/28/2012 1503   BASOSABS 0.0 01/10/2018 1930   BASOSABS 0.0 04/28/2012 1503   Comprehensive Metabolic Panel:    Component Value Date/Time   NA 133 (L) 01/17/2018 0519   NA 121 (L) 09/24/2017 1458   NA 135 (L) 04/28/2012 1503   K 3.5 01/17/2018 0519   K 5.0 04/28/2012 1503   CL 105 01/17/2018 0519   CL 101 04/28/2012 1503   CO2 22 01/17/2018 0519   CO2 26 04/28/2012 1503   BUN 58 (H) 01/17/2018 0519   BUN 37 (H) 09/24/2017 1458   BUN 13 04/28/2012 1503   CREATININE 2.41 (H) 01/17/2018 0519   CREATININE 1.49 (H) 04/28/2012 1503   GLUCOSE 186 (H) 01/17/2018 0519   GLUCOSE 239 (H) 04/28/2012 1503   CALCIUM 8.0 (L) 01/17/2018 0519   CALCIUM 8.2 (L) 02/19/2017 1522   AST 46 (H)  01/10/2018 1930   AST 25 04/28/2012 1503   ALT 21 01/10/2018 1930   ALT 18 04/28/2012 1503   ALKPHOS 403 (H) 01/10/2018 1930   ALKPHOS 181 (H) 04/28/2012 1503   BILITOT 1.6 (H) 01/13/2018 1517   BILITOT 3.9 (H) 09/23/2017 1336   BILITOT 0.8 04/28/2012 1503   PROT 8.1 01/10/2018 1930   PROT 5.6 (L) 09/23/2017 1336   PROT 8.4 (H) 04/28/2012 1503   ALBUMIN 2.3 (L) 01/10/2018 1930   ALBUMIN 2.8 (L) 09/23/2017 1336   ALBUMIN 3.1 (L) 04/28/2012 1503    RADIOGRAPHIC STUDIES: US Paracentesis  Result Date: 01/20/2018 INDICATION: Stage IV renal cell carcinoma with metastatic disease to the lung. Decompensated cirrhosis and recurrent ascites. Request for therapeutic paracentesis. EXAM: ULTRASOUND GUIDED PARACENTESIS MEDICATIONS: 1% Lidocaine 10 mL COMPLICATIONS: None immediate. PROCEDURE: Informed written consent was obtained from the patient after a discussion of the risks, benefits and alternatives to treatment. A timeout was performed prior to the initiation of the procedure. Initial ultrasound scanning demonstrates a large amount of ascites within the right lower abdominal quadrant. The right lower abdomen was prepped and draped in the usual sterile fashion. 1% lidocaine with epinephrine was used for local anesthesia. Following this, a 6 Fr Safe-T-Centesis catheter was introduced. An ultrasound image was saved for documentation purposes. The paracentesis was performed. The catheter was removed and a dressing was applied. The patient tolerated the procedure well without immediate post procedural complication. FINDINGS: A total of approximately 8 L of clear yellow  fluid was removed. IMPRESSION: Successful ultrasound-guided paracentesis yielding 8 liters of peritoneal fluid. Read by: Gareth Eagle, PA- Electronically Signed   By: Aletta Edouard M.D.   On: 01/20/2018 11:42   US Paracentesis  Result Date: 01/13/2018 INDICATION: Cirrhosis and ascites. EXAM: ULTRASOUND GUIDED PARACENTESIS MEDICATIONS: None.  COMPLICATIONS: None immediate. PROCEDURE: Informed written consent was obtained from the patient after a discussion of the risks, benefits and alternatives to treatment. A timeout was performed prior to the initiation of the procedure. Initial ultrasound was performed to localize ascites. The right lower abdomen was prepped and draped in the usual sterile fashion. 1% lidocaine was used for local anesthesia. Following this, a 6 Fr Safe-T-Centesis catheter was introduced. An ultrasound image was saved for documentation purposes. The paracentesis was performed. The catheter was removed and a dressing was applied. The patient tolerated the procedure well without immediate post procedural complication. FINDINGS: A total of approximately 6 L of yellow fluid was removed. Samples were sent to the laboratory as requested by the clinical team. IMPRESSION: Successful ultrasound-guided paracentesis yielding 6 liters of peritoneal fluid. Electronically Signed   By: Aletta Edouard M.D.   On: 01/13/2018 14:37   US Paracentesis  Result Date: 12/27/2017 INDICATION: 63 year old male with alcoholic cirrhosis and recurrent large volume ascites. He presents for scheduled therapeutic large volume paracentesis. EXAM: ULTRASOUND GUIDED  PARACENTESIS MEDICATIONS: None. COMPLICATIONS: None immediate. PROCEDURE: Informed written consent was obtained from the patient after a discussion of the risks, benefits and alternatives to treatment. A timeout was performed prior to the initiation of the procedure. Initial ultrasound scanning demonstrates a large amount of ascites within the right lower abdominal quadrant. The right lower abdomen was prepped and draped in the usual sterile fashion. 1% lidocaine with epinephrine was used for local anesthesia. Following this, a 6 Fr Safe-T-Centesis catheter was introduced. An ultrasound image was saved for documentation purposes. The paracentesis was performed. The catheter was removed and a dressing  was applied. The patient tolerated the procedure well without immediate post procedural complication. FINDINGS: A total of approximately 5300 mL of yellow ascitic fluid was removed. IMPRESSION: Successful ultrasound-guided paracentesis yielding 5.3 liters of peritoneal fluid. Electronically Signed   By: Jacqulynn Cadet M.D.   On: 12/27/2017 14:20   Dg Chest Port 1 View  Result Date: 01/11/2018 CLINICAL DATA:  Acute respiratory failure. EXAM: PORTABLE CHEST 1 VIEW COMPARISON:  One-view chest x-ray 01/10/2017 FINDINGS: Heart size is normal. Aortic atherosclerosis is again seen. Left basilar airspace disease is similar the prior study. Left effusion is suspected. Minimal atelectasis is present at the right base. IMPRESSION: 1. Similar appearance of left basilar airspace disease, indicating pneumonia. 2. Probable small left pleural effusion. Electronically Signed   By: San Morelle M.D.   On: 01/11/2018 08:13   Dg Chest Port 1 View  Result Date: 01/10/2018 CLINICAL DATA:  Chills and nausea. Previous history of renal cell carcinoma metastatic to lung. EXAM: PORTABLE CHEST 1 VIEW COMPARISON:  12/12/2017 FINDINGS: Shallow inspiration. Heart size and pulmonary vascularity are normal for technique. Suggestion of infiltration or atelectasis in the left lung base. Probable small left pleural effusion as seen previously. Could consider pneumonia in the appropriate clinical setting. No pneumothorax. Calcification of the aorta. Degenerative changes in the shoulders. IMPRESSION: Infiltration or atelectasis in the left lung base with small left pleural effusion. Consider pneumonia in the appropriate clinical setting. Electronically Signed   By: Lucienne Capers M.D.   On: 01/10/2018 20:00    PERFORMANCE STATUS (ECOG) :  3 - Symptomatic, >50% confined to bed  Review of Systems Unable to provide. Was sleeping  Physical Exam deferred  IMPRESSION: Weekend notes reviewed. Patient transferred out of ICU. Oral  intake remains poor.   Patient is s/p large volume paracentesis earlier today with 8L of fluid removed. Last paracentesis was a week prior on 1/13 with 6L removed. Consideration could be given for Tenckhoff catheter to allow outpatient management of the ascites.   Brother asked about a feeding tube due to poor oral intake. I explained that oral intake is often poor as patient's decline and approach end of life. However, he would like to speak with GI regarding the appropriateness of a PEG. I explained that likely PEG would be contraindicated given the ascites.   Plan is for rehab with palliative care following. Case discussed with Dr. Grayland Ormond and care management.   PLAN: Continue current scope of treatment Rehab with palliative care and then possible transition to hospice   Time Total: 20 minutes  Visit consisted of counseling and education dealing with the complex and emotionally intense issues of symptom management and palliative care in the setting of serious and potentially life-threatening illness.Greater than 50%  of this time was spent counseling and coordinating care related to the above assessment and plan.  Signed by: Altha Harm, PhD, NP-C [phone] (Work Cell)

## 2018-01-20 NOTE — Progress Notes (Signed)
Inpatient Diabetes Program Recommendations  AACE/ADA: New Consensus Statement on Inpatient Glycemic Control (2015)  Target Ranges:  Prepandial:   less than 140 mg/dL      Peak postprandial:   less than 180 mg/dL (1-2 hours)      Critically ill patients:  140 - 180 mg/dL   Results for Rodney Stewart, Rodney Stewart (MRN [id number]) as of 01/20/2018 10:05  Ref. Range 01/18/2018 23:21 01/19/2018 04:03 01/19/2018 07:37 01/19/2018 11:56 01/19/2018 16:05 01/19/2018 20:17  Glucose-Capillary Latest Ref Range: 70 - 99 mg/dL 309 (H)  7 units NOVOLOG  282 (H)  5 units NOVOLOG  274 (H)  5 units NOVOLOG  210 (H)  3 units NOVOLOG  251 (H)  5 units NOVOLOG  287 (H)  5 units NOVOLOG +  12 units LEVEMIR   Results for Rodney Stewart, Rodney Stewart (MRN [id number]) as of 01/20/2018 10:05  Ref. Range 01/19/2018 23:58 01/20/2018 04:06 01/20/2018 07:52  Glucose-Capillary Latest Ref Range: 70 - 99 mg/dL 345 (H)  7 units NOVOLOG  292 (H)  5 units NOVOLOG  234 (H)  3 units NOVOLOG     Home DM Meds: Levemir 18 units BID                             Novolog 2-12 units TID per SSI  Current Orders: Levemir 12 units QHS      Novolog Sensitive Correction Scale/ SSI (0-9 units) Q4 hours        MD- Please consider the following in-hospital insulin adjustments as CBGs remain elevated >200 mg/dl:  Increase Levemir to 12 units BID (70% total home dose)     --Will follow patient during hospitalization--  Wyn Quaker RN, MSN, CDE Diabetes Coordinator Inpatient Glycemic Control Team Team Pager: [phone] (8a-5p)

## 2018-01-20 NOTE — Progress Notes (Signed)
Physical Therapy Treatment Patient Details Name: Rodney Stewart MRN: [id number] DOB: 1955-05-24 Today's Date: 01/20/2018    History of Present Illness Rodney Stewart is a 63yo male with onset of sepsis PNA, hypothermia from sepsis, requiring paracentesis on 1/13 with removal of 6L fluid. Pt moved to CCU on 1/16 for insulin infusion p sustained hyperglycemia. PMHx:  ascites, DM, CKD, renal cell CA, gout, nephrectomy, HTN, cirrhosis, hyponatremia, low K+,     PT Comments    Patient received in bed, family present. Patient very lethargic, occasionally able to slightly open eyes. Patient agrees to PT. Able to minimally participate in LE theraputic exercises. Family reports thoracentesis performed earlier today and 8 liters drained off of him. Patient's LEs swollen with pitting edema and scrotum is extremely swollen. Patient is unable to participate in mobility this visit. Patient will continue to benefit from skilled PT to address his weakness and difficulty with transfers at this time.       Follow Up Recommendations  SNF     Equipment Recommendations  None recommended by PT    Recommendations for Other Services       Precautions / Restrictions Precautions Precautions: Fall    Mobility  Bed Mobility               General bed mobility comments: patient unable to participate in bed mobility this visit as he is too lethargic.   Transfers                    Ambulation/Gait                 Stairs             Wheelchair Mobility    Modified Rankin (Stroke Patients Only)       Balance       Sitting balance - Comments: not assessed                                     Cognition Arousal/Alertness: Lethargic Behavior During Therapy: Flat affect Overall Cognitive Status: Difficult to assess                         Following Commands: Follows one step commands with increased time              Exercises Other  Exercises Other Exercises: AAROM exercises of B LEs: AP, heel slides, hip abd/add x 10 reps bilaterally.      General Comments        Pertinent Vitals/Pain Pain Assessment: Faces Faces Pain Scale: Hurts little more Pain Location: pain with LE exercises Pain Descriptors / Indicators: Discomfort;Tightness Pain Intervention(s): Monitored during session;Limited activity within patient's tolerance    Home Living Family/patient expects to be discharged to:: Private residence Living Arrangements: Alone Available Help at Discharge: Family;Available PRN/intermittently Type of Home: House Home Access: Stairs to enter Entrance Stairs-Rails: None Home Layout: Two level;Bed/bath upstairs Home Equipment: Walker - 2 wheels      Prior Function Level of Independence: Independent      Comments: did not use an AD   PT Goals (current goals can now be found in the care plan section) Acute Rehab PT Goals Patient Stated Goal: reduce groin pain, regain strength in legs PT Goal Formulation: With patient/family Time For Goal Achievement: 01/13/2018 Potential to Achieve Goals: Fair    Frequency  Min 2X/week      PT Plan      Co-evaluation              AM-PAC PT "6 Clicks" Mobility   Outcome Measure  Help needed turning from your back to your side while in a flat bed without using bedrails?: Total Help needed moving from lying on your back to sitting on the side of a flat bed without using bedrails?: Total Help needed moving to and from a bed to a chair (including a wheelchair)?: Total Help needed standing up from a chair using your arms (e.g., wheelchair or bedside chair)?: Total Help needed to walk in hospital room?: Total Help needed climbing 3-5 steps with a railing? : Total 6 Click Score: 6    End of Session   Activity Tolerance: Patient limited by fatigue Patient left: in bed;with family/visitor present Nurse Communication: Mobility status PT Visit Diagnosis: Muscle  weakness (generalized) (M62.81);Adult, failure to thrive (R62.7)     Time: 0174-9449 PT Time Calculation (min) (ACUTE ONLY): 10 min  Charges:  $Therapeutic Exercise: 8-22 mins                     Raine Blodgett, PT, GCS 01/20/18,1:23 PM

## 2018-01-20 NOTE — Care Management Important Message (Signed)
Important Message  Patient Details  Name: Rodney Stewart MRN: [id number] Date of Birth: 1955-06-24   Medicare Important Message Given:  Yes    Juliann Pulse A Xzavien Harada 01/20/2018, 11:52 AM

## 2018-01-20 NOTE — Plan of Care (Signed)
  Problem: Education: Goal: Knowledge of General Education information will improve Description Including pain rating scale, medication(s)/side effects and non-pharmacologic comfort measures Outcome: Progressing   Problem: Fluid Volume: Goal: Hemodynamic stability will improve Outcome: Progressing   Problem: Clinical Measurements: Goal: Diagnostic test results will improve Outcome: Progressing Goal: Signs and symptoms of infection will decrease Outcome: Progressing   Problem: Respiratory: Goal: Ability to maintain adequate ventilation will improve Outcome: Progressing

## 2018-01-20 NOTE — Progress Notes (Signed)
[facility]  ROUNDING NOTE   Subjective:   Large volume paracentesis today. 8 liters removed.   Family at bedside. Patient is lethargic.   Objective:  Vital signs in last 24 hours:  Temp:  [97.7 F (36.5 C)-98.5 F (36.9 C)] 97.7 F (36.5 C) (01/20 0350) Pulse Rate:  [86-106] 93 (01/20 1035) Resp:  [16-22] 19 (01/20 0933) BP: (115-135)/(67-90) 117/67 (01/20 1035) SpO2:  [96 %-99 %] 98 % (01/20 1035)  Weight change:  Filed Weights   01/11/18 0038 01/15/18 0354 01/17/18 2000  Weight: 83.8 kg 79.8 kg 85 kg    Intake/Output: I/O last 3 completed shifts: In: 752.2 [P.O.:240; IV Piggyback:512.2] Out: 1400 [Urine:1400]   Intake/Output this shift:  No intake/output data recorded.  Physical Exam: General: No acute distress, cachexic  Head: Emaciated facial features  Eyes: Anicteric  Neck: Supple, trachea midline  Lungs:  Clear to auscultation, normal effort  Heart: S1S2 no rubs  Abdomen:  Soft, nontender,   Extremities: 2+ peripheral edema.  Neurologic: Awake, alert, following commands  Skin: No lesions       Basic Metabolic Panel: Recent Labs  Lab 01/14/18 1917 01/15/18 0050 01/15/18 0407 01/16/18 0933 01/17/18 0519  NA 127* 125* 127* 133* 133*  K 4.0 3.5 3.4* 3.3* 3.5  CL 97* 96* 98 104 105  CO2 17* 19* 19* 22 22  GLUCOSE 861* 779* 675* 66* 186*  BUN 63* 64* 62* 57* 58*  CREATININE 2.12* 2.27* 2.23* 2.31* 2.41*  CALCIUM 7.7* 7.7* 7.8* 7.9* 8.0*    Liver Function Tests: Recent Labs  Lab 01/13/18 1517  BILITOT 1.6*   No results for input(s): LIPASE, AMYLASE in the last 168 hours. No results for input(s): AMMONIA in the last 168 hours.  CBC: Recent Labs  Lab 01/15/18 0500 01/17/18 0519  WBC 3.3* 4.5  HGB 8.0* 8.1*  HCT 28.0* 27.1*  MCV 82.8 79.0*  PLT 120* 96*    Cardiac Enzymes: No results for input(s): CKTOTAL, CKMB, CKMBINDEX, TROPONINI in the last 168 hours.  BNP: Invalid input(s): POCBNP  CBG: Recent Labs  Lab  01/19/18 2017 01/19/18 2358 01/20/18 0406 01/20/18 0752 01/20/18 1139  GLUCAP 287* 345* 292* 234* 224*    Microbiology: Results for orders placed or performed during the hospital encounter of 01/10/18  Blood Culture (routine x 2)     Status: None   Collection Time: 01/10/18  7:30 PM  Result Value Ref Range Status   Specimen Description BLOOD LEFT ANTECUBITAL  Final   Special Requests   Final    BOTTLES DRAWN AEROBIC AND ANAEROBIC Blood Culture adequate volume   Culture   Final    NO GROWTH 5 DAYS Performed at [facility], [address], [place], [place] [address]    Report Status 01/15/2018 FINAL  Final  Blood Culture (routine x 2)     Status: None   Collection Time: 01/10/18  7:30 PM  Result Value Ref Range Status   Specimen Description BLOOD BLOOD LEFT FOREARM  Final   Special Requests   Final    BOTTLES DRAWN AEROBIC AND ANAEROBIC Blood Culture adequate volume   Culture   Final    NO GROWTH 5 DAYS Performed at [facility], [address], [place], [place] [address]    Report Status 01/15/2018 FINAL  Final  MRSA PCR Screening     Status: None   Collection Time: 01/11/18  2:21 AM  Result Value Ref Range Status   MRSA by PCR NEGATIVE  NEGATIVE Final    Comment:        The GeneXpert MRSA Assay (FDA approved for NASAL specimens only), is one component of a comprehensive MRSA colonization surveillance program. It is not intended to diagnose MRSA infection nor to guide or monitor treatment for MRSA infections. Performed at [facility], [address], [place], [place] [address]   Body fluid culture     Status: None   Collection Time: 01/13/18  1:52 PM  Result Value Ref Range Status   Specimen Description   Final    PERITONEAL Performed at [facility], [address] [place], [place] [address]    Special Requests   Final    NONE Performed at [facility], [place], [place] [address]    Gram  Stain NO WBC SEEN NO ORGANISMS SEEN   Final   Culture   Final    NO GROWTH 3 DAYS Performed at [facility], [place] [address], [place], [place] [address]    Report Status 01/17/2018 FINAL  Final  Urine culture     Status: None   Collection Time: 01/13/18  9:57 PM  Result Value Ref Range Status   Specimen Description   Final    URINE, RANDOM Performed at [facility], [address], [place], [place] [address]    Special Requests   Final    NONE Performed at [facility], [address], [place], [place] [address]    Culture   Final    NO GROWTH Performed at [facility], [place] [address] [place], [place] [address]    Report Status 01/15/2018 FINAL  Final    Coagulation Studies: No results for input(s): LABPROT, INR in the last 72 hours.  Urinalysis: No results for input(s): COLORURINE, LABSPEC, PHURINE, GLUCOSEU, HGBUR, BILIRUBINUR, KETONESUR, PROTEINUR, UROBILINOGEN, NITRITE, LEUKOCYTESUR in the last 72 hours.  Invalid input(s): APPERANCEUR    Imaging: US Paracentesis  Result Date: 01/20/2018 INDICATION: Stage IV renal cell carcinoma with metastatic disease to the lung. Decompensated cirrhosis and recurrent ascites. Request for therapeutic paracentesis. EXAM: ULTRASOUND GUIDED PARACENTESIS MEDICATIONS: 1% Lidocaine 10 mL COMPLICATIONS: None immediate. PROCEDURE: Informed written consent was obtained from the patient after a discussion of the risks, benefits and alternatives to treatment. A timeout was performed prior to the initiation of the procedure. Initial ultrasound scanning demonstrates a large amount of ascites within the right lower abdominal quadrant. The right lower abdomen was prepped and draped in the usual sterile fashion. 1% lidocaine with epinephrine was used for local anesthesia. Following this, a 6 Fr Safe-T-Centesis catheter was introduced. An ultrasound image was saved for documentation purposes. The paracentesis was performed.  The catheter was removed and a dressing was applied. The patient tolerated the procedure well without immediate post procedural complication. FINDINGS: A total of approximately 8 L of clear yellow fluid was removed. IMPRESSION: Successful ultrasound-guided paracentesis yielding 8 liters of peritoneal fluid. Read by: Gareth Eagle, PA- Electronically Signed   By: Aletta Edouard M.D.   On: 01/20/2018 11:42     Medications:   . albumin human 25 g (01/20/18 0516)   . allopurinol  100 mg Oral Daily  . aspirin EC  81 mg Oral Daily  . feeding supplement (ENSURE ENLIVE)  237 mL Oral TID BM  . [START ON 01/21/2018] furosemide  40 mg Oral Daily  . gabapentin  200 mg Oral BID  . insulin aspart  0-9 Units Subcutaneous Q4H  .  insulin detemir  12 Units Subcutaneous QHS  . [START ON 01/21/2018] lactulose  30 g Oral Daily  . levothyroxine  75 mcg Oral Q0600  . multivitamin with minerals  1 tablet Oral Daily  . pantoprazole  40 mg Oral BID AC  . polyethylene glycol  17 g Oral Daily  . potassium chloride  20 mEq Oral BID  . pravastatin  20 mg Oral q1800  . rifaximin  550 mg Oral BID AC & HS  . tamsulosin  0.4 mg Oral QODAY   acetaminophen **OR** acetaminophen, dextrose, docusate sodium, HYDROmorphone (DILAUDID) injection, ipratropium-albuterol, ondansetron **OR** ondansetron (ZOFRAN) IV, oxyCODONE  Assessment/ Plan:  63 y.o. male with BPH, chronic kidney disease stage III baseline creatinine 1.5, metastatic renal cell carcinoma status post left nephrectomy 05/2012, diabetes mellitus type 2, hypertension, EtoH use, admitted with pneumonia, ARF, ascites, debility.   1.  Acute renal failure. 2.  Chronic kidney disease stage III 3.  Hyponatremia. 4.  Marked ascites/Cirrhosis  5. Hepatic cirrhosis   Plan: Overall patient appears to be declining.  He has significant malnutrition in the setting of metastatic renal cell carcinoma, liver cirrhosis, and chronic kidney disease.  Transition to PO  furosemide Discontinue IV albumin Overall prognosis is poor. Not a candidate for dialysis.    LOS: [place] 1/20/202012:59 PM

## 2018-01-20 NOTE — Progress Notes (Addendum)
[facility] [place] NAME: Rodney Stewart    MR#:  [id number]  DATE OF BIRTH:  [birth year]  Transfer to ICU yesterday evening for insulin drip due to elevated blood sugar to more than 800.  CHIEF COMPLAINT:   Chief Complaint  Patient presents with  . Chills  . Nausea  Patient seen and evaluated today Has abdominal distention from ascites Gets short of breath sometimes secondary to abdominal distention Blood sugars have been better controlled Has improved appetite Generalized weakness improving  REVIEW OF SYSTEMS:   ROS CONSTITUTIONAL: No fever, improved oral intake..  Patient appears very cachectic.  Patient is resting EARS, NOSE, AND THROAT: No tinnitus or ear pain.  RESPIRATORY: No cough, shortness of breath, wheezing or hemoptysis.  CARDIOVASCULAR: No chest pain, orthopnea, edema.  GASTROINTESTINA symptoms; ; lessabdominal pain. Has abdominal distention GENITOURINARY: No dysuria, hematuria.  ENDOCRINE: No polyuria, nocturia,  HEMATOLOGY: Anemia, thrombocytopenia due to liver cirrhosis SKIN: No rash or lesion. MUSCULOSKELETAL: No joint pain or arthritis.   NEUROLOGIC: No tingling, numbness, weakness.  PSYCHIATRY: No anxiety or depression.   DRUG ALLERGIES:  No Known Allergies  VITALS:  Blood pressure 117/67, pulse 93, temperature 97.7 F (36.5 C), resp. rate 19, height 5\' 11"  (1.803 m), weight 85 kg, SpO2 98 %.  PHYSICAL EXAMINATION:  GENERAL:  63 y.o.-year-old patient lying in the bed with no acute distress.  EYES: Pupils equal, round, reactive to light and accommodation. No scleral icterus. Extraocular muscles intact.  HEENT: Head atraumatic, normocephalic. Oropharynx and nasopharynx clear.  NECK:  Supple, no jugular venous distention. No thyroid enlargement, no tenderness.  LUNGS diminished air entry bilaterally CARDIOVASCULAR: S1, S2 normal. No murmurs, rubs, or gallops.  ABDOMEN: Distended, bowel sounds  present Hernia noted.  Ascites noted EXTREMITIES: No pedal edema, cyanosis, or clubbing.  NEUROLOGIC: Cranial nerves II through XII are intact. Muscle strength 5/5 in all extremities. Sensation intact. Gait not checked.  PSYCHIATRIC: The patient is alert and oriented x 3.  SKIN: No obvious rash, lesion, or ulcer.    LABORATORY PANEL:   CBC Recent Labs  Lab 01/17/18 0519  WBC 4.5  HGB 8.1*  HCT 27.1*  PLT 96*   ------------------------------------------------------------------------------------------------------------------  Chemistries  Recent Labs  Lab 01/13/18 1517  01/17/18 0519  NA  --    < > 133*  K  --    < > 3.5  CL  --    < > 105  CO2  --    < > 22  GLUCOSE  --    < > 186*  BUN  --    < > 58*  CREATININE  --    < > 2.41*  CALCIUM  --    < > 8.0*  BILITOT 1.6*  --   --    < > = values in this interval not displayed.   ------------------------------------------------------------------------------------------------------------------  Cardiac Enzymes No results for input(s): TROPONINI in the last 168 hours. ------------------------------------------------------------------------------------------------------------------  RADIOLOGY:  No results found.  EKG:   Orders placed or performed during the hospital encounter of 01/10/18  . EKG 12-Lead  . EKG 12-Lead    ASSESSMENT AND PLAN:  63 year old male patient with history of liver cirrhosis, clear-cell renal cancer comes in because of generalized weakness, body aches and found to have hypothermia, admitted for septic shock, pneumonia in left lung, admitted to ICU.  1 .sepsis present on admission secondary to pneumonia has resolved Weaned off oxygen  2.  Metastatic  renal cancer Palliative care follow-up  3.  Abdominal ascites with advanced cirrhosis of liver Repeat abdominal ultrasound-guided paracentesis During this hospitalization already 8 L of fluid has been removed by paracentesis Will arrange  repeat paracentesis today for therapeutic relief Continue IV albumin infusion Supportive care   4.  Diabetes mellitus type 2 better controlled now Off insulin drip Currently on Levemir insulin once daily with sliding scale coverage with insulin Diabetic diet  5.  Liver cirrhosis Supportive care  6.  Severe malnutrition secondary to metastatic cancer Nutritional supplements  7.  Lower lower extremity edema secondary to third spacing of fluid from hypoalbuminemia  diurese with Lasix as tolerated Monitor renal function electrolytes  8.  Acute on chronic renal failure, CKD stage III Monitor renal function   9. Prognosis poor in view of advanced liver disease  10.  Discussed with patient brother medical condition treatment plan SNF placement f/u pending insurance authorization Palliative care follow-up at [facility] Social worker follow-up  All the records are reviewed and case discussed with Care Management/Social Workerr. Management plans discussed with the patient, family and they are in agreement.  CODE STATUS; DNR.  TOTAL TIME TAKING CARE OF THIS PATIENT: 35 minutes.   More than 50% time spent in counseling, coordination of care   Saundra Shelling M.D on 01/20/2018 at 11:02 AM  Between 7am to 6pm - Pager - [phone]  After 6pm go to [email or web address] - password [email or web address] [facility]  Office  [phone]  CC: Primary care physician; [name], [name], MD   Note: This dictation was prepared with Dragon dictation along with smaller phrase technology. Any transcriptional errors that result from this process are unintentional.

## 2018-01-20 NOTE — Procedures (Signed)
PROCEDURE SUMMARY:  Successful US guided paracentesis from right lateral abdomen.  Yielded 8 L of clear yellow fluid.  No immediate complications.  Patient tolerated well.  EBL = trace  Specimen was sent for labs.  Shaquanna Lycan S Gurvir Schrom PA-C 01/20/2018 12:13 PM

## 2018-01-20 NOTE — Progress Notes (Signed)
01/20/18 1400  Clinical Encounter Type  Visited With Family  Visit Type Follow-up  Spiritual Encounters  Spiritual Needs Emotional  Stress Factors  Family Stress Factors Major life changes  Ch met family members in the hallway. Family is discussing where to put patient after discharge, the options available are either hospice or nursing home. Daughter is reluctant to talk about future care plan with pt yet because the plan is not definite. Ch suggested that the family together start talking about pt's future with the pt so to help the pt prepare himself for the upcoming major transition in life.

## 2018-01-21 ENCOUNTER — Other Ambulatory Visit: Payer: Self-pay | Admitting: Hospice and Palliative Medicine

## 2018-01-21 DIAGNOSIS — R112 Nausea with vomiting, unspecified: Secondary | ICD-10-CM | POA: Diagnosis not present

## 2018-01-21 DIAGNOSIS — C649 Malignant neoplasm of unspecified kidney, except renal pelvis: Secondary | ICD-10-CM

## 2018-01-21 DIAGNOSIS — Z7401 Bed confinement status: Secondary | ICD-10-CM | POA: Diagnosis not present

## 2018-01-21 DIAGNOSIS — R188 Other ascites: Secondary | ICD-10-CM | POA: Diagnosis not present

## 2018-01-21 DIAGNOSIS — E1100 Type 2 diabetes mellitus with hyperosmolarity without nonketotic hyperglycemic-hyperosmolar coma (NKHHC): Secondary | ICD-10-CM | POA: Diagnosis not present

## 2018-01-21 DIAGNOSIS — A419 Sepsis, unspecified organism: Secondary | ICD-10-CM | POA: Diagnosis not present

## 2018-01-21 DIAGNOSIS — Z515 Encounter for palliative care: Secondary | ICD-10-CM | POA: Diagnosis not present

## 2018-01-21 DIAGNOSIS — D649 Anemia, unspecified: Secondary | ICD-10-CM | POA: Diagnosis not present

## 2018-01-21 DIAGNOSIS — J181 Lobar pneumonia, unspecified organism: Secondary | ICD-10-CM | POA: Diagnosis not present

## 2018-01-21 DIAGNOSIS — R69 Illness, unspecified: Secondary | ICD-10-CM | POA: Diagnosis not present

## 2018-01-21 DIAGNOSIS — J9600 Acute respiratory failure, unspecified whether with hypoxia or hypercapnia: Secondary | ICD-10-CM | POA: Diagnosis not present

## 2018-01-21 DIAGNOSIS — R5381 Other malaise: Secondary | ICD-10-CM | POA: Diagnosis not present

## 2018-01-21 DIAGNOSIS — M6281 Muscle weakness (generalized): Secondary | ICD-10-CM | POA: Diagnosis not present

## 2018-01-21 DIAGNOSIS — R6521 Severe sepsis with septic shock: Secondary | ICD-10-CM | POA: Diagnosis not present

## 2018-01-21 DIAGNOSIS — R52 Pain, unspecified: Secondary | ICD-10-CM | POA: Diagnosis not present

## 2018-01-21 DIAGNOSIS — M2550 Pain in unspecified joint: Secondary | ICD-10-CM | POA: Diagnosis not present

## 2018-01-21 DIAGNOSIS — K7460 Unspecified cirrhosis of liver: Secondary | ICD-10-CM | POA: Diagnosis not present

## 2018-01-21 DIAGNOSIS — E119 Type 2 diabetes mellitus without complications: Secondary | ICD-10-CM | POA: Diagnosis not present

## 2018-01-21 DIAGNOSIS — R404 Transient alteration of awareness: Secondary | ICD-10-CM | POA: Diagnosis not present

## 2018-01-21 DIAGNOSIS — C7802 Secondary malignant neoplasm of left lung: Principal | ICD-10-CM

## 2018-01-21 DIAGNOSIS — K7469 Other cirrhosis of liver: Secondary | ICD-10-CM | POA: Diagnosis not present

## 2018-01-21 LAB — BASIC METABOLIC PANEL
Anion gap: 4 — ABNORMAL LOW (ref 5–15)
BUN: 49 mg/dL — ABNORMAL HIGH (ref 8–23)
CALCIUM: 8.1 mg/dL — AB (ref 8.9–10.3)
CO2: 23 mmol/L (ref 22–32)
Chloride: 104 mmol/L (ref 98–111)
Creatinine, Ser: 2.41 mg/dL — ABNORMAL HIGH (ref 0.61–1.24)
GFR calc Af Amer: 32 mL/min — ABNORMAL LOW (ref >60)
GFR calc non Af Amer: 28 mL/min — ABNORMAL LOW (ref >60)
Glucose, Bld: 375 mg/dL — ABNORMAL HIGH (ref 70–99)
Potassium: 3.8 mmol/L (ref 3.5–5.1)
Sodium: 131 mmol/L — ABNORMAL LOW (ref 135–145)

## 2018-01-21 LAB — GLUCOSE, CAPILLARY
Glucose-Capillary: 358 mg/dL — ABNORMAL HIGH (ref 70–99)
Glucose-Capillary: 273 mg/dL — ABNORMAL HIGH (ref 70–99)

## 2018-01-21 MED ORDER — ALPRAZOLAM 1 MG PO TABS: 1.0000 mg | ORAL_TABLET | Freq: Two times a day (BID) | ORAL | 0 refills | Status: AC | PRN

## 2018-01-21 MED ORDER — OXYCODONE HCL 5 MG PO TABS: 5.0000 mg | ORAL_TABLET | Freq: Four times a day (QID) | ORAL | 0 refills | Status: AC | PRN

## 2018-01-21 MED ORDER — INSULIN DETEMIR 100 UNIT/ML ~~LOC~~ SOLN
10.0000 [IU] | Freq: Two times a day (BID) | SUBCUTANEOUS | Status: DC
  Filled 2018-01-21 (×3): qty 0.1

## 2018-01-21 MED ORDER — FUROSEMIDE 40 MG PO TABS: 40.0000 mg | ORAL_TABLET | Freq: Every day | ORAL | 0 refills | Status: AC

## 2018-01-21 MED ORDER — ADULT MULTIVITAMIN W/MINERALS CH: 1.0000 | ORAL_TABLET | Freq: Every day | ORAL | 0 refills | Status: AC

## 2018-01-21 MED ORDER — POTASSIUM CHLORIDE CRYS ER 20 MEQ PO TBCR: 20.0000 meq | EXTENDED_RELEASE_TABLET | Freq: Two times a day (BID) | ORAL | 0 refills | Status: AC

## 2018-01-21 NOTE — Discharge Summary (Signed)
[facility] NAME: Rodney Stewart    MR#:  [id number]  DATE OF BIRTH:  17-Jan-1955  DATE OF ADMISSION:  01/10/2018 ADMITTING PHYSICIAN: Saundra Shelling, MD  DATE OF DISCHARGE: No discharge date for patient encounter.  PRIMARY CARE PHYSICIAN: [name], [name], MD    ADMISSION DIAGNOSIS:  Ascites [R18.8] Septic shock ([place]) [A41.9, R65.21] Pneumonia of left lower lobe due to infectious organism ([place]) [J18.1]  DISCHARGE DIAGNOSIS:  Active Problems:   Sepsis ([place])   Pneumonia of left lower lobe due to infectious organism [facility])   Palliative care encounter   Acute respiratory failure ([place])   Septic shock ([place])   SECONDARY DIAGNOSIS:   Past Medical History:  Diagnosis Date  . Anemia   . Ascites   . BPH (benign prostatic hyperplasia)   . Cirrhosis ([place])   . CKD (chronic kidney disease) stage 3, GFR 30-59 ml/min (HCC)   . Clear cell renal cell carcinoma ([place]) 2014   Left Nephrectomy.  . Colon polyps   . Diabetes mellitus without complication ([place])    type 2   . Dyspnea    with exertion  . GERD (gastroesophageal reflux disease)   . History of gout   . History of nephrectomy    Left  . Hypertension   . Neuropathy   . Renal Cancer    Renal Cancer  . Renal cell carcinoma of left kidney (HCC)    mets to lungs  . Umbilical hernia     HOSPITAL COURSE:  63 year old male patient with history of liver cirrhosis, clear-cell renal cancer comes in because of generalized weakness, body aches and found to have hypothermia, admitted for septic shock, pneumonia in left lung, admitted to ICU.  *sepsis present on admission  secondary to pneumonia  resolved Weaned off oxygen  *Metastatic renal cancer Palliativecare following  *Abdominal ascites with advanced cirrhosis of liver Repeat abdominal ultrasound-guided paracentesis Status post paracentesis January 20 with 8 L of fluid has been removed  Supportive  care  *Diabetes mellitus type 2  Controlled on current regimen  Successfully weaned off insulin drip   *Chronic liver cirrhosis Supportive care Poor prognosis going forward-palliative care to follow status post discharge  *Severe malnutrition secondary to metastatic cancer Worsening noted Continue nutritional supplements  *Lower lower extremity edema  secondary to third spacing of fluid from hypoalbuminemia and chronic cirrhosis Treated with Lasix   *Acute on chronic renal failure, CKD stage III Avoided nephrotoxic agents  Disposition to skilled nursing facility with palliative care going forward given dismal prognosis  DISCHARGE CONDITIONS:   guarded  CONSULTS OBTAINED:  Treatment Team:  Anthonette Legato, MD  DRUG ALLERGIES:  No Known Allergies  DISCHARGE MEDICATIONS:   Allergies as of 01/21/2018   No Known Allergies     Medication List    STOP taking these medications   gabapentin 100 MG capsule Commonly known as:  NEURONTIN   potassium chloride 10 MEQ tablet Commonly known as:  K-DUR     TAKE these medications   allopurinol 100 MG tablet Commonly known as:  ZYLOPRIM Take 1 tablet (100 mg total) by mouth daily.   ALPRAZolam 1 MG tablet Commonly known as:  XANAX Take 1 tablet (1 mg total) by mouth 2 (two) times daily as needed for anxiety or sleep.   aspirin EC 81 MG tablet Take 81 mg by mouth daily.   docusate sodium 100 MG capsule Commonly known as:  COLACE Take 1 capsule (  100 mg total) by mouth 2 (two) times daily as needed for mild constipation.   feeding supplement (ENSURE ENLIVE) Liqd Take 237 mLs by mouth 3 (three) times daily between meals.   furosemide 40 MG tablet Commonly known as:  LASIX Take 1 tablet (40 mg total) by mouth daily. Start taking on:  January 22, 2018 What changed:    medication strength  how much to take  additional instructions   insulin aspart 100 UNIT/ML FlexPen Commonly known as:  NOVOLOG Before  meals 131-180 2 units, 181-240 4 units, 241-300 6 units, 301-350 8 units, 351-400 10 units, >400 12 units   Insulin Detemir 100 UNIT/ML Pen Commonly known as:  LEVEMIR FLEXTOUCH Inject 18 Units into the skin 2 (two) times daily. With food   Insulin Pen Needle 32G X 4 MM Misc 1 Device by Does not apply route 2 (two) times daily. E11.9   lactulose 10 GM/15ML solution Commonly known as:  CHRONULAC Take 45 mLs (30 g total) by mouth daily.   levothyroxine 75 MCG tablet Commonly known as:  SYNTHROID, LEVOTHROID Take 1 tablet (75 mcg total) by mouth daily before breakfast. 30 minutes   lovastatin 20 MG tablet Commonly known as:  MEVACOR Take 1 tablet (20 mg total) by mouth daily at 6 PM.   magic mouthwash w/lidocaine Soln Take 5 mLs by mouth 3 (three) times daily as needed for mouth pain. Swish and spit   multivitamin with minerals Tabs tablet Take 1 tablet by mouth daily.   mupirocin ointment 2 % Commonly known as:  BACTROBAN Apply 1 application topically 3 (three) times daily. Left big toe and right back   ONE TOUCH ULTRA TEST test strip Generic drug:  glucose blood Bid   oxyCODONE 5 MG immediate release tablet Commonly known as:  ROXICODONE Take 1 tablet (5 mg total) by mouth every 6 (six) hours as needed for severe pain or breakthrough pain.   polyethylene glycol packet Commonly known as:  MIRALAX Take 17 g by mouth daily.   potassium chloride SA 20 MEQ tablet Commonly known as:  K-DUR,KLOR-CON Take 1 tablet (20 mEq total) by mouth 2 (two) times daily. What changed:  when to take this   rifaximin 550 MG Tabs tablet Commonly known as:  XIFAXAN Take 1 tablet (550 mg total) by mouth 2 (two) times daily at 8 am and 10 pm.   tamsulosin 0.4 MG Caps capsule Commonly known as:  FLOMAX Take 1 capsule (0.4 mg total) by mouth every other day. With supper        DISCHARGE INSTRUCTIONS:      If you experience worsening of your admission symptoms, develop shortness of  breath, life threatening emergency, suicidal or homicidal thoughts you must seek medical attention immediately by calling 911 or calling your MD immediately  if symptoms less severe.  You Must read complete instructions/literature along with all the possible adverse reactions/side effects for all the Medicines you take and that have been prescribed to you. Take any new Medicines after you have completely understood and accept all the possible adverse reactions/side effects.   Please note  You were cared for by a hospitalist during your hospital stay. If you have any questions about your discharge medications or the care you received while you were in the hospital after you are discharged, you can call the unit and asked to speak with the hospitalist on call if the hospitalist that took care of you is not available. Once you are discharged, your  primary care physician will handle any further medical issues. Please note that NO REFILLS for any discharge medications will be authorized once you are discharged, as it is imperative that you return to your primary care physician (or establish a relationship with a primary care physician if you do not have one) for your aftercare needs so that they can reassess your need for medications and monitor your lab values.    Today   CHIEF COMPLAINT:   Chief Complaint  Patient presents with  . Chills  . Nausea    HISTORY OF PRESENT ILLNESS:  63 y.o. male with a known history of ascites, cirrhosis of liver, prostate hypertrophy, CKD stage III, type 2 diabetes mellitus, GERD, hypertension, renal cancer of left kidney presented to the emergency room for chills, nausea.  His body temperature was low around 93 F.  Patient was put on warming blanket.  Evaluation in the emergency room revealed elevated lactic acid code sepsis was called.  Patient was given IV fluids based on sepsis protocol and started on broad-spectrum antibiotics.  Not been taking his lactulose  regularly.  Follows up with gastroenterology as outpatient in the clinic.  Gets periodic paracentesis done.   VITAL SIGNS:  Blood pressure 126/76, pulse 99, temperature 98.1 F (36.7 C), temperature source Oral, resp. rate 20, height 5\' 11"  (1.803 m), weight 85 kg, SpO2 97 %.  I/O:    Intake/Output Summary (Last 24 hours) at 01/21/2018 1245 Last data filed at 01/21/2018 1110 Gross per 24 hour  Intake 480 ml  Output 1150 ml  Net -670 ml    PHYSICAL EXAMINATION:  GENERAL:  63 y.o.-year-old patient lying in the bed with no acute distress.  EYES: Pupils equal, round, reactive to light and accommodation. No scleral icterus. Extraocular muscles intact.  HEENT: Head atraumatic, normocephalic. Oropharynx and nasopharynx clear.  NECK:  Supple, no jugular venous distention. No thyroid enlargement, no tenderness.  LUNGS: Normal breath sounds bilaterally, no wheezing, rales,rhonchi or crepitation. No use of accessory muscles of respiration.  CARDIOVASCULAR: S1, S2 normal. No murmurs, rubs, or gallops.  ABDOMEN: Soft, non-tender, non-distended. Bowel sounds present. No organomegaly or mass.  EXTREMITIES: No pedal edema, cyanosis, or clubbing.  NEUROLOGIC: Cranial nerves II through XII are intact. Muscle strength 5/5 in all extremities. Sensation intact. Gait not checked.  PSYCHIATRIC: The patient is alert and oriented x 3.  SKIN: No obvious rash, lesion, or ulcer.   DATA REVIEW:   CBC Recent Labs  Lab 01/17/18 0519  WBC 4.5  HGB 8.1*  HCT 27.1*  PLT 96*    Chemistries  Recent Labs  Lab 01/21/18 0430  NA 131*  K 3.8  CL 104  CO2 23  GLUCOSE 375*  BUN 49*  CREATININE 2.41*  CALCIUM 8.1*    Cardiac Enzymes No results for input(s): TROPONINI in the last 168 hours.  Microbiology Results  Results for orders placed or performed during the hospital encounter of 01/10/18  Blood Culture (routine x 2)     Status: None   Collection Time: 01/10/18  7:30 PM  Result Value Ref Range  Status   Specimen Description BLOOD LEFT ANTECUBITAL  Final   Special Requests   Final    BOTTLES DRAWN AEROBIC AND ANAEROBIC Blood Culture adequate volume   Culture   Final    NO GROWTH 5 DAYS Performed at [facility], [address], [place], [place] [address]    Report Status 01/15/2018 FINAL  Final  Blood Culture (routine x 2)  Status: None   Collection Time: 01/10/18  7:30 PM  Result Value Ref Range Status   Specimen Description BLOOD BLOOD LEFT FOREARM  Final   Special Requests   Final    BOTTLES DRAWN AEROBIC AND ANAEROBIC Blood Culture adequate volume   Culture   Final    NO GROWTH 5 DAYS Performed at [facility], [address] [place], [place] [address]    Report Status 01/15/2018 FINAL  Final  MRSA PCR Screening     Status: None   Collection Time: 01/11/18  2:21 AM  Result Value Ref Range Status   MRSA by PCR NEGATIVE NEGATIVE Final    Comment:        The GeneXpert MRSA Assay (FDA approved for NASAL specimens only), is one component of a comprehensive MRSA colonization surveillance program. It is not intended to diagnose MRSA infection nor to guide or monitor treatment for MRSA infections. Performed at [facility], [address], [place], [place] [address]   Body fluid culture     Status: None   Collection Time: 01/13/18  1:52 PM  Result Value Ref Range Status   Specimen Description   Final    PERITONEAL Performed at [facility], [address] [place], [place] [address]    Special Requests   Final    NONE Performed at [facility], [place], [place] [address]    Gram Stain NO WBC SEEN NO ORGANISMS SEEN   Final   Culture   Final    NO GROWTH 3 DAYS Performed at [facility], [place] [address] [place], [place] [address]    Report Status 01/17/2018 FINAL  Final  Urine culture     Status: None   Collection Time: 01/13/18  9:57 PM  Result Value Ref Range Status   Specimen  Description   Final    URINE, RANDOM Performed at [facility], [address] [place], [place] [address]    Special Requests   Final    NONE Performed at [facility], [address], [place], [place] [address]    Culture   Final    NO GROWTH Performed at [facility], [place] [address] [place], [place] [address]    Report Status 01/15/2018 FINAL  Final    RADIOLOGY:  US Paracentesis  Result Date: 01/20/2018 INDICATION: Stage IV renal cell carcinoma with metastatic disease to the lung. Decompensated cirrhosis and recurrent ascites. Request for therapeutic paracentesis. EXAM: ULTRASOUND GUIDED PARACENTESIS MEDICATIONS: 1% Lidocaine 10 mL COMPLICATIONS: None immediate. PROCEDURE: Informed written consent was obtained from the patient after a discussion of the risks, benefits and alternatives to treatment. A timeout was performed prior to the initiation of the procedure. Initial ultrasound scanning demonstrates a large amount of ascites within the right lower abdominal quadrant. The right lower abdomen was prepped and draped in the usual sterile fashion. 1% lidocaine with epinephrine was used for local anesthesia. Following this, a 6 Fr Safe-T-Centesis catheter was introduced. An ultrasound image was saved for documentation purposes. The paracentesis was performed. The catheter was removed and a dressing was applied. The patient tolerated the procedure well without immediate post procedural complication. FINDINGS: A total of approximately 8 L of clear yellow fluid was removed. IMPRESSION: Successful ultrasound-guided paracentesis yielding 8 liters of peritoneal fluid. Read by: Gareth Eagle, PA- Electronically Signed   By: Aletta Edouard M.D.   On: 01/20/2018 11:42    EKG:   Orders  placed or performed during the hospital encounter of 01/10/18  . EKG 12-Lead  . EKG 12-Lead      Management plans discussed with the patient, family and they are in agreement.  CODE  STATUS:     Code Status Orders  (From admission, onward)         Start     Ordered   01/10/18 2255  Do not attempt resuscitation (DNR)  Continuous    Question Answer Comment  In the event of cardiac or respiratory ARREST Do not call a "code blue"   In the event of cardiac or respiratory ARREST Do not perform Intubation, CPR, defibrillation or ACLS   In the event of cardiac or respiratory ARREST Use medication by any route, position, wound care, and other measures to relive pain and suffering. May use oxygen, suction and manual treatment of airway obstruction as needed for comfort.      01/10/18 2254        Code Status History    Date Active Date Inactive Code Status Order ID Comments User Context   10/19/2017 0011 10/22/2017 2047 DNR [id number]  Lance Coon, MD Inpatient   09/16/2017 1346 09/18/2017 1544 DNR [id number]  Jimmy Footman, NP Inpatient   09/14/2017 0254 09/16/2017 1346 Full Code [id number]  Amelia Jo, MD Inpatient   01/25/2017 1037 01/27/2017 1704 Full Code [id number]  Bettey Costa, MD ED   09/30/2015 1609 10/03/2015 1530 Full Code [id number]  Demetrios Loll, MD Inpatient      TOTAL TIME TAKING CARE OF THIS PATIENT: 40 minutes.    Avel Peace Carmita Boom M.D on 01/21/2018 at 12:45 PM  Between 7am to 6pm - Pager - [phone]  After 6pm go to [email or web address] - password [email or web address] [facility]  Office  [phone]  CC: Primary care physician; [name], [name], MD   Note: This dictation was prepared with Dragon dictation along with smaller phrase technology. Any transcriptional errors that result from this process are unintentional.

## 2018-01-21 NOTE — Progress Notes (Signed)
[place]  Telephone:([phone] [phone]   Name: Rodney Stewart Date: 01/21/2018 MRN: [id number]  DOB: [birth year]  Patient Care Team: [name], [name], MD as PCP - General (Internal Medicine)    REASON FOR CONSULTATION: Palliative Care consult requested for this62 y.o.malewith multiple medical problems including stage IV renal cell carcinoma status post left nephrectomy (May 2014) metastatic to lung, advanced decompensated cirrhosis with recurrent ascites, and CKD 3. Patient's RCC is being managed with second line nivolumab. He was hospitalized 10/18/2017 to 10/20/2017 with hepatorenal syndrome.He is now admitted 01/10/18 with sepsis from PNA. He initially required BIPAP due to respiratory failure.Palliative care was consulted to help establish goals of care.  CODE STATUS: DNR  PAST MEDICAL HISTORY: Past Medical History:  Diagnosis Date  . Anemia   . Ascites   . BPH (benign prostatic hyperplasia)   . Cirrhosis ([place])   . CKD (chronic kidney disease) stage 3, GFR 30-59 ml/min (HCC)   . Clear cell renal cell carcinoma ([place]) 2014   Left Nephrectomy.  . Colon polyps   . Diabetes mellitus without complication ([place])    type 2   . Dyspnea    with exertion  . GERD (gastroesophageal reflux disease)   . History of gout   . History of nephrectomy    Left  . Hypertension   . Neuropathy   . Renal Cancer    Renal Cancer  . Renal cell carcinoma of left kidney (HCC)    mets to lungs  . Umbilical hernia     PAST SURGICAL HISTORY:  Past Surgical History:  Procedure Laterality Date  . BACK SURGERY     ruptured disc 1991   . ENDOBRONCHIAL ULTRASOUND N/A 10/14/2015   Procedure: ENDOBRONCHIAL ULTRASOUND;  Surgeon: [name], MD;  Location: [facility] ORS;  Service: Pulmonary;  Laterality: N/A;  . ESOPHAGOGASTRODUODENOSCOPY (EGD) WITH PROPOFOL N/A 04/15/2017   Procedure: ESOPHAGOGASTRODUODENOSCOPY (EGD) WITH  PROPOFOL;  Surgeon: Jonathon Bellows, MD;  Location: [facility] ENDOSCOPY;  Service: Gastroenterology;  Laterality: N/A;  Screen for esophageal varices  . EYE SURGERY Bilateral    Cataract Extraction with IOL  . IR RADIOLOGIST EVAL & MGMT  10/15/2017  . KNEE SURGERY Right   . NEPHRECTOMY     left kidney 2014 renal cell cancer     HEMATOLOGY/ONCOLOGY HISTORY:  Oncology History   Patient underwent left nephrectomy on May 16, 2012 which revealed a clear cell grade 2 renal cell carcinoma, stage TIIIa, N0, M0. Tumor size of 16 cm. Patient was noted to have renal vein involvement, but no other structures were involved. 0 of 2 lymph nodes were negative for disease. PET scan on October 20, 2015 revealed metastatic disease and patient was initiated on Votrient. This was subsequently discontinued in March 2018 secondary to progression of disease. Patient initiated second line treatment with nivolumab on Monday, April 02, 2016     Metastatic renal cell carcinoma to lung [facility])    Initial Diagnosis    Metastatic renal cell carcinoma to lung ([place])     ALLERGIES:  has No Known Allergies.  MEDICATIONS:  Current Facility-Administered Medications  Medication Dose Route Frequency Provider Last Rate Last Dose  . acetaminophen (TYLENOL) tablet 650 mg  650 mg Oral Q6H PRN Saundra Shelling, MD       Or  . acetaminophen (TYLENOL) suppository 650 mg  650 mg Rectal Q6H PRN Pyreddy, Pavan, MD      . allopurinol (ZYLOPRIM) tablet 100 mg  100 mg Oral Daily Saundra Shelling, MD   100 mg at 01/21/18 0850  . ALPRAZolam Duanne Moron) tablet 1 mg  1 mg Oral QHS PRN Mayo, Pete Pelt, MD   1 mg at 01/20/18 2208  . aspirin EC tablet 81 mg  81 mg Oral Daily Saundra Shelling, MD   81 mg at 01/21/18 7106  . dextrose 50 % solution 25 mL  25 mL Intravenous PRN Lance Coon, MD      . docusate sodium (COLACE) capsule 100 mg  100 mg Oral BID PRN Pyreddy, Reatha Harps, MD      . feeding supplement (ENSURE ENLIVE) (ENSURE ENLIVE) liquid 237 mL  237 mL Oral  TID BM Pyreddy, Pavan, MD   237 mL at 01/20/18 1538  . furosemide (LASIX) tablet 40 mg  40 mg Oral Daily Kolluru, Sarath, MD   40 mg at 01/21/18 2694  . gabapentin (NEURONTIN) capsule 200 mg  200 mg Oral BID Epifanio Lesches, MD   200 mg at 01/21/18 8546  . HYDROmorphone (DILAUDID) injection 0.5 mg  0.5 mg Intravenous Q4H PRN Lance Coon, MD   0.5 mg at 01/21/18 1233  . insulin aspart (novoLOG) injection 0-5 Units  0-5 Units Subcutaneous QHS Saundra Shelling, MD   2 Units at 01/20/18 2133  . insulin aspart (novoLOG) injection 0-9 Units  0-9 Units Subcutaneous TID WC Saundra Shelling, MD   5 Units at 01/21/18 1236  . insulin detemir (LEVEMIR) injection 10 Units  10 Units Subcutaneous BID Salary, Montell D, MD      . ipratropium-albuterol (DUONEB) 0.5-2.5 (3) MG/3ML nebulizer solution 3 mL  3 mL Nebulization Q6H PRN Tukov-Yual, Magdalene S, NP      . lactulose (CHRONULAC) 10 GM/15ML solution 30 g  30 g Oral Daily Pyreddy, Pavan, MD      . levothyroxine (SYNTHROID, LEVOTHROID) tablet 75 mcg  75 mcg Oral [id number] Saundra Shelling, MD   75 mcg at 01/21/18 0609  . multivitamin with minerals tablet 1 tablet  1 tablet Oral Daily Epifanio Lesches, MD   1 tablet at 01/21/18 1247  . ondansetron (ZOFRAN) tablet 4 mg  4 mg Oral Q6H PRN Saundra Shelling, MD       Or  . ondansetron (ZOFRAN) injection 4 mg  4 mg Intravenous Q6H PRN Saundra Shelling, MD   4 mg at 01/16/18 1608  . oxyCODONE (Oxy IR/ROXICODONE) immediate release tablet 5 mg  5 mg Oral Q4H PRN Epifanio Lesches, MD   5 mg at 01/20/18 0207  . pantoprazole (PROTONIX) EC tablet 40 mg  40 mg Oral BID AC Epifanio Lesches, MD   40 mg at 01/21/18 0093  . polyethylene glycol (MIRALAX / GLYCOLAX) packet 17 g  17 g Oral Daily Pyreddy, Pavan, MD   17 g at 01/19/18 1309  . potassium chloride SA (K-DUR,KLOR-CON) CR tablet 20 mEq  20 mEq Oral BID Saundra Shelling, MD   20 mEq at 01/21/18 0832  . pravastatin (PRAVACHOL) tablet 20 mg  20 mg Oral q1800 Saundra Shelling, MD   20 mg at 01/20/18 1802  . rifaximin (XIFAXAN) tablet 550 mg  550 mg Oral BID AC & HS Pyreddy, Reatha Harps, MD   550 mg at 01/21/18 0849  . tamsulosin (FLOMAX) capsule 0.4 mg  0.4 mg Oral QODAY Pyreddy, Pavan, MD   0.4 mg at 01/19/18 0831    VITAL SIGNS: BP 126/76 (BP Location: Left Arm)   Pulse 99   Temp 98.1 F (36.7 C) (Oral)   Resp 20  Ht 5\' 11"  (1.803 m)   Wt 187 lb 8 oz (85 kg)   SpO2 97%   BMI 26.15 kg/m  Filed Weights   01/11/18 0038 01/15/18 0354 01/17/18 2000  Weight: 184 lb 11.9 oz (83.8 kg) 175 lb 14.8 oz (79.8 kg) 187 lb 8 oz (85 kg)    Estimated body mass index is 26.15 kg/m as calculated from the following:   Height as of this encounter: 5\' 11"  (1.803 m).   Weight as of this encounter: 187 lb 8 oz (85 kg).  LABS: CBC:    Component Value Date/Time   WBC 4.5 01/17/2018 0519   HGB 8.1 (L) 01/17/2018 0519   HGB 8.2 (L) 04/28/2012 1503   HCT 27.1 (L) 01/17/2018 0519   HCT 26.1 (L) 04/28/2012 1503   PLT 96 (L) 01/17/2018 0519   PLT 357 04/28/2012 1503   MCV 79.0 (L) 01/17/2018 0519   MCV 69 (L) 04/28/2012 1503   NEUTROABS 11.0 (H) 01/10/2018 1930   NEUTROABS 4.1 04/28/2012 1503   LYMPHSABS 0.4 (L) 01/10/2018 1930   LYMPHSABS 0.7 (L) 04/28/2012 1503   MONOABS 0.4 01/10/2018 1930   MONOABS 0.6 04/28/2012 1503   EOSABS 0.0 01/10/2018 1930   EOSABS 0.1 04/28/2012 1503   BASOSABS 0.0 01/10/2018 1930   BASOSABS 0.0 04/28/2012 1503   Comprehensive Metabolic Panel:    Component Value Date/Time   NA 131 (L) 01/21/2018 0430   NA 121 (L) 09/24/2017 1458   NA 135 (L) 04/28/2012 1503   K 3.8 01/21/2018 0430   K 5.0 04/28/2012 1503   CL 104 01/21/2018 0430   CL 101 04/28/2012 1503   CO2 23 01/21/2018 0430   CO2 26 04/28/2012 1503   BUN 49 (H) 01/21/2018 0430   BUN 37 (H) 09/24/2017 1458   BUN 13 04/28/2012 1503   CREATININE 2.41 (H) 01/21/2018 0430   CREATININE 1.49 (H) 04/28/2012 1503   GLUCOSE 375 (H) 01/21/2018 0430   GLUCOSE 239 (H) 04/28/2012  1503   CALCIUM 8.1 (L) 01/21/2018 0430   CALCIUM 8.2 (L) 02/19/2017 1522   AST 46 (H) 01/10/2018 1930   AST 25 04/28/2012 1503   ALT 21 01/10/2018 1930   ALT 18 04/28/2012 1503   ALKPHOS 403 (H) 01/10/2018 1930   ALKPHOS 181 (H) 04/28/2012 1503   BILITOT 1.6 (H) 01/13/2018 1517   BILITOT 3.9 (H) 09/23/2017 1336   BILITOT 0.8 04/28/2012 1503   PROT 8.1 01/10/2018 1930   PROT 5.6 (L) 09/23/2017 1336   PROT 8.4 (H) 04/28/2012 1503   ALBUMIN 2.3 (L) 01/10/2018 1930   ALBUMIN 2.8 (L) 09/23/2017 1336   ALBUMIN 3.1 (L) 04/28/2012 1503    RADIOGRAPHIC STUDIES: US Paracentesis  Result Date: 01/20/2018 INDICATION: Stage IV renal cell carcinoma with metastatic disease to the lung. Decompensated cirrhosis and recurrent ascites. Request for therapeutic paracentesis. EXAM: ULTRASOUND GUIDED PARACENTESIS MEDICATIONS: 1% Lidocaine 10 mL COMPLICATIONS: None immediate. PROCEDURE: Informed written consent was obtained from the patient after a discussion of the risks, benefits and alternatives to treatment. A timeout was performed prior to the initiation of the procedure. Initial ultrasound scanning demonstrates a large amount of ascites within the right lower abdominal quadrant. The right lower abdomen was prepped and draped in the usual sterile fashion. 1% lidocaine with epinephrine was used for local anesthesia. Following this, a 6 Fr Safe-T-Centesis catheter was introduced. An ultrasound image was saved for documentation purposes. The paracentesis was performed. The catheter was removed and a dressing was applied. The  patient tolerated the procedure well without immediate post procedural complication. FINDINGS: A total of approximately 8 L of clear yellow fluid was removed. IMPRESSION: Successful ultrasound-guided paracentesis yielding 8 liters of peritoneal fluid. Read by: Gareth Eagle, PA- Electronically Signed   By: Aletta Edouard M.D.   On: 01/20/2018 11:42   US Paracentesis  Result Date:  01/13/2018 INDICATION: Cirrhosis and ascites. EXAM: ULTRASOUND GUIDED PARACENTESIS MEDICATIONS: None. COMPLICATIONS: None immediate. PROCEDURE: Informed written consent was obtained from the patient after a discussion of the risks, benefits and alternatives to treatment. A timeout was performed prior to the initiation of the procedure. Initial ultrasound was performed to localize ascites. The right lower abdomen was prepped and draped in the usual sterile fashion. 1% lidocaine was used for local anesthesia. Following this, a 6 Fr Safe-T-Centesis catheter was introduced. An ultrasound image was saved for documentation purposes. The paracentesis was performed. The catheter was removed and a dressing was applied. The patient tolerated the procedure well without immediate post procedural complication. FINDINGS: A total of approximately 6 L of yellow fluid was removed. Samples were sent to the laboratory as requested by the clinical team. IMPRESSION: Successful ultrasound-guided paracentesis yielding 6 liters of peritoneal fluid. Electronically Signed   By: Aletta Edouard M.D.   On: 01/13/2018 14:37   US Paracentesis  Result Date: 12/27/2017 INDICATION: 63 year old male with alcoholic cirrhosis and recurrent large volume ascites. He presents for scheduled therapeutic large volume paracentesis. EXAM: ULTRASOUND GUIDED  PARACENTESIS MEDICATIONS: None. COMPLICATIONS: None immediate. PROCEDURE: Informed written consent was obtained from the patient after a discussion of the risks, benefits and alternatives to treatment. A timeout was performed prior to the initiation of the procedure. Initial ultrasound scanning demonstrates a large amount of ascites within the right lower abdominal quadrant. The right lower abdomen was prepped and draped in the usual sterile fashion. 1% lidocaine with epinephrine was used for local anesthesia. Following this, a 6 Fr Safe-T-Centesis catheter was introduced. An ultrasound image was  saved for documentation purposes. The paracentesis was performed. The catheter was removed and a dressing was applied. The patient tolerated the procedure well without immediate post procedural complication. FINDINGS: A total of approximately 5300 mL of yellow ascitic fluid was removed. IMPRESSION: Successful ultrasound-guided paracentesis yielding 5.3 liters of peritoneal fluid. Electronically Signed   By: Jacqulynn Cadet M.D.   On: 12/27/2017 14:20   Dg Chest Port 1 View  Result Date: 01/11/2018 CLINICAL DATA:  Acute respiratory failure. EXAM: PORTABLE CHEST 1 VIEW COMPARISON:  One-view chest x-ray 01/10/2017 FINDINGS: Heart size is normal. Aortic atherosclerosis is again seen. Left basilar airspace disease is similar the prior study. Left effusion is suspected. Minimal atelectasis is present at the right base. IMPRESSION: 1. Similar appearance of left basilar airspace disease, indicating pneumonia. 2. Probable small left pleural effusion. Electronically Signed   By: San Morelle M.D.   On: 01/11/2018 08:13   Dg Chest Port 1 View  Result Date: 01/10/2018 CLINICAL DATA:  Chills and nausea. Previous history of renal cell carcinoma metastatic to lung. EXAM: PORTABLE CHEST 1 VIEW COMPARISON:  12/12/2017 FINDINGS: Shallow inspiration. Heart size and pulmonary vascularity are normal for technique. Suggestion of infiltration or atelectasis in the left lung base. Probable small left pleural effusion as seen previously. Could consider pneumonia in the appropriate clinical setting. No pneumothorax. Calcification of the aorta. Degenerative changes in the shoulders. IMPRESSION: Infiltration or atelectasis in the left lung base with small left pleural effusion. Consider pneumonia in the appropriate clinical setting. Electronically  Signed   By: Lucienne Capers M.D.   On: 01/10/2018 20:00    PERFORMANCE STATUS (ECOG) : 3 - Symptomatic, >50% confined to bed  Review of Systems No pain or other distressing  symptoms  Physical Exam General: ill appearing, thin, lying in bed Resp: CTA Cards: regular rhythm Abd: distended Exts: edema bilaterally Neuro: lethargic, wakes and answers simple questions when stimulated  IMPRESSION: Patient remains lethargic. Family report that he is awake at night and sleeping during the day. We discussed stimulating him during daytime hours and good sleep hygiene. Would try medication at bedtime to help with sleep cycle but he is at high risk of adverse effects given his advanced cirrhosis.   Discussed goals again with family. They all seem to recognize that his prognosis is poor and that he may be approaching end of life. Family are still considering option of SNF vs home with hospice.   PLAN: Continue current scope of treatment SNF with palliative care vs home with hospice   Time Total: 20 minutes  Visit consisted of counseling and education dealing with the complex and emotionally intense issues of symptom management and palliative care in the setting of serious and potentially life-threatening illness.Greater than 50%  of this time was spent counseling and coordinating care related to the above assessment and plan.  Signed by: Altha Harm, PhD, NP-C [phone] (Work Cell)

## 2018-01-21 NOTE — Progress Notes (Addendum)
Inpatient Diabetes Program Recommendations  AACE/ADA: New Consensus Statement on Inpatient Glycemic Control (2015)  Target Ranges:  Prepandial:   less than 140 mg/dL      Peak postprandial:   less than 180 mg/dL (1-2 hours)      Critically ill patients:  140 - 180 mg/dL   Results for Rodney Stewart, Rodney Stewart (MRN [id number]) as of 01/21/2018 09:51  Ref. Range 01/19/2018 23:58 01/20/2018 04:06 01/20/2018 07:52 01/20/2018 11:39 01/20/2018 15:53 01/20/2018 17:38 01/20/2018 20:48 01/20/2018 22:44  Glucose-Capillary Latest Ref Range: 70 - 99 mg/dL 345 (H)  7 units NOVOLOG  292 (H)  5 units NOVOLOG  234 (H)  3 units NOVOLOG  224 (H)  3 units NOVOLOG  197 (H) 201 (H)  3 units NOVOLOG  228 (H)  2 units NOVOLOG  266 (H)    13 units LEVEMIR   Results for Rodney Stewart, Rodney Stewart (MRN [id number]) as of 01/21/2018 09:51  Ref. Range 01/21/2018 08:05  Glucose-Capillary Latest Ref Range: 70 - 99 mg/dL 358 (H)  8 units NOVOLOG     Home DM Meds: Levemir 18 units BID Novolog 2-12 units TID per SSI  Current Orders: Levemir 13 units QHS                            Novolog Sensitive Correction Scale/ SSI (0-9 units) TID AC + HS      MD- Please consider the following in-hospital insulin adjustments as CBGs remain elevated >200 mg/dl:  1. Increase Levemir to 13 units BID (75% total home dose)  2. Increase Novolog SSI to Moderate scale (0-15 units)    --Will follow patient during hospitalization--  Wyn Quaker RN, MSN, CDE Diabetes Coordinator Inpatient Glycemic Control Team Team Pager: [phone] (8a-5p)

## 2018-01-21 NOTE — Progress Notes (Signed)
Physical Therapy Treatment Patient Details Name: Rodney Stewart MRN: [id number] DOB: Jul 31, 1955 Today's Date: 01/21/2018    History of Present Illness Rodney Stewart is a 63yo male with onset of sepsis PNA, hypothermia from sepsis, requiring paracentesis on 1/13 with removal of 6L fluid. Pt moved to CCU on 1/16 for insulin infusion p sustained hyperglycemia. PMHx:  ascites, DM, CKD, renal cell CA, gout, nephrectomy, HTN, cirrhosis, hyponatremia, low K+,     PT Comments    Patient more alert upon entering room this visit. Eyes open, more talkative. Family present. Reports he had a good lunch. Patient agreeable to PT. Performed supine LE exercises bilatetally with min assist. Pain reported in right knee with exercise. Patient then assisted to edge of bed with +2 assist. Able to assist with sitting balance some by using B UEs, however having a lot of pain in sitting therefore only able to tolerate for maybe 30 seconds. Patient will benefit from continued skilled PT to address his weakness and difficulty with mobility.       Follow Up Recommendations  SNF     Equipment Recommendations  None recommended by PT    Recommendations for Other Services       Precautions / Restrictions Precautions Precautions: Fall Restrictions Weight Bearing Restrictions: No    Mobility  Bed Mobility Overal bed mobility: Needs Assistance Bed Mobility: Supine to Sit;Sit to Supine     Supine to sit: Max assist;+2 for physical assistance Sit to supine: Max assist   General bed mobility comments: patient requires +2 assist for bed mobility and has significant pain with mobility  Transfers                 General transfer comment: unable to attempt  Ambulation/Gait             General Gait Details: unable to attempt   Stairs             Wheelchair Mobility    Modified Rankin (Stroke Patients Only)       Balance Overall balance assessment: Needs assistance Sitting-balance  support: Bilateral upper extremity supported Sitting balance-Leahy Scale: Fair                                      Cognition Arousal/Alertness: Lethargic Behavior During Therapy: Flat affect Overall Cognitive Status: Difficult to assess Area of Impairment: Problem solving;Attention;Orientation                 Orientation Level: Disoriented to     Following Commands: Follows one step commands with increased time Safety/Judgement: Decreased awareness of safety;Decreased awareness of deficits   Problem Solving: Slow processing;Decreased initiation;Difficulty sequencing;Requires verbal cues;Requires tactile cues        Exercises Total Joint Exercises Ankle Circles/Pumps: AAROM;15 reps;Both;Supine Short Arc Quad: AROM;15 reps;Both;Supine Heel Slides: AAROM;Both;Supine;15 reps Hip ABduction/ADduction: AAROM;15 reps;Both;Supine    General Comments        Pertinent Vitals/Pain Pain Assessment: Faces Pain Score: 8  Faces Pain Scale: Hurts whole lot Pain Location: Pain with mobility and LE exercises, most pain with supine to sit and sitting at EOB Pain Descriptors / Indicators: Moaning;Grimacing Pain Intervention(s): Limited activity within patient's tolerance;Monitored during session;Repositioned    Home Living                      Prior Function  PT Goals (current goals can now be found in the care plan section) Acute Rehab PT Goals Patient Stated Goal: reduce groin pain, regain strength in legs PT Goal Formulation: With patient/family Time For Goal Achievement: 01/01/2018 Potential to Achieve Goals: Fair Progress towards PT goals: Progressing toward goals    Frequency           PT Plan Current plan remains appropriate    Co-evaluation              AM-PAC PT "6 Clicks" Mobility   Outcome Measure  Help needed turning from your back to your side while in a flat bed without using bedrails?: A Lot Help needed  moving from lying on your back to sitting on the side of a flat bed without using bedrails?: Total Help needed moving to and from a bed to a chair (including a wheelchair)?: Total Help needed standing up from a chair using your arms (e.g., wheelchair or bedside chair)?: Total Help needed to walk in hospital room?: Total Help needed climbing 3-5 steps with a railing? : Total 6 Click Score: 7    End of Session   Activity Tolerance: Patient limited by fatigue;Patient limited by pain Patient left: in bed;with family/visitor present;with bed alarm set Nurse Communication: Mobility status PT Visit Diagnosis: Difficulty in walking, not elsewhere classified (R26.2);Muscle weakness (generalized) (M62.81);Pain Pain - part of body: (right knee, scrotum, bottom)     Time: 0938-1829 PT Time Calculation (min) (ACUTE ONLY): 25 min  Charges:  $Therapeutic Exercise: 23-37 mins                     Caylea Foronda, PT, GCS 01/21/18,1:59 PM

## 2018-01-21 NOTE — Progress Notes (Signed)
Ambulatory referral to palliative care at [facility].

## 2018-01-21 NOTE — Clinical Social Work Note (Signed)
Patient is medically ready for discharge today. CSW notified patient's family at bedside of discharge today to [facility]. Family is in agreement. CSW also notified Neoma Laming at [facility] of discharge today. Patient will be transported by EMS. RN to call report and call for transport.   [place], [place]

## 2018-01-21 NOTE — Progress Notes (Signed)
EMS called for transport. Sarath Privott S, RN  

## 2018-01-21 NOTE — Clinical Social Work Note (Signed)
CSW received call from Neoma Laming at [facility]. Neoma Laming states that they have received [employer] authorization for patient. CSW notified MD of above. CSW will continue to follow for transitions of care.   [place], [place]

## 2018-01-21 NOTE — Care Management Important Message (Signed)
Important Message  Patient Details  Name: LIAHM GRIVAS MRN: [id number] Date of Birth: 11/15/1955   Medicare Important Message Given:  Yes    Juliann Pulse A Ankita Newcomer 01/21/2018, 11:14 AM

## 2018-01-22 ENCOUNTER — Telehealth: Payer: Self-pay | Admitting: Oncology

## 2018-01-22 ENCOUNTER — Telehealth: Payer: Self-pay

## 2018-01-22 NOTE — Telephone Encounter (Signed)
**  Will be Transported via AMBULANCE** Lab + F/U w Dr. Woodfin Ganja + OPDIVO rschd per Lynnell Catalan @ [facility] @ [phone].  New Demographics to be updated upon arrival to appt. Msg sent to MD & team.

## 2018-01-22 NOTE — Telephone Encounter (Signed)
Patient is currently at [facility].  Spoke with social worker Levada Dy) at [facility] and she plans to contact the office when he is ready for discharge home.  Hospital follow up appointment with pcp currently scheduled on 01/20/2018 cancelled.  Brother (Richard) notified and agrees.  Transitional care management to be completed once discharged home.

## 2018-01-23 ENCOUNTER — Inpatient Hospital Stay: Payer: Medicare HMO

## 2018-01-23 ENCOUNTER — Ambulatory Visit: Payer: Medicare HMO | Admitting: Oncology

## 2018-01-23 ENCOUNTER — Inpatient Hospital Stay: Payer: Medicare HMO | Admitting: Oncology

## 2018-01-23 ENCOUNTER — Other Ambulatory Visit: Payer: Medicare HMO

## 2018-01-23 ENCOUNTER — Ambulatory Visit: Payer: Medicare HMO

## 2018-01-23 DIAGNOSIS — K7469 Other cirrhosis of liver: Secondary | ICD-10-CM | POA: Diagnosis not present

## 2018-01-23 DIAGNOSIS — E119 Type 2 diabetes mellitus without complications: Secondary | ICD-10-CM | POA: Diagnosis not present

## 2018-01-23 DIAGNOSIS — R5381 Other malaise: Secondary | ICD-10-CM | POA: Diagnosis not present

## 2018-01-23 DIAGNOSIS — R188 Other ascites: Secondary | ICD-10-CM | POA: Diagnosis not present

## 2018-01-27 ENCOUNTER — Encounter: Payer: Self-pay | Admitting: Nurse Practitioner

## 2018-01-27 ENCOUNTER — Non-Acute Institutional Stay: Payer: Medicare HMO | Admitting: Nurse Practitioner

## 2018-01-27 ENCOUNTER — Other Ambulatory Visit
Admission: RE | Admit: 2018-01-27 | Discharge: 2018-01-27 | Disposition: A | Discharge status: Home / Self Care | Payer: Medicare HMO | Source: Ambulatory Visit | Attending: Family Medicine | Admitting: Family Medicine

## 2018-01-27 VITALS — HR 110 | Resp 20

## 2018-01-27 DIAGNOSIS — Z515 Encounter for palliative care: Secondary | ICD-10-CM

## 2018-01-27 DIAGNOSIS — D649 Anemia, unspecified: Secondary | ICD-10-CM | POA: Insufficient documentation

## 2018-01-27 DIAGNOSIS — R52 Pain, unspecified: Secondary | ICD-10-CM

## 2018-01-27 DIAGNOSIS — R451 Restlessness and agitation: Secondary | ICD-10-CM

## 2018-01-27 DIAGNOSIS — R69 Illness, unspecified: Secondary | ICD-10-CM | POA: Diagnosis not present

## 2018-01-27 LAB — CBC WITH DIFFERENTIAL/PLATELET
Abs Immature Granulocytes: 0 10*3/uL (ref 0.00–0.07)
Basophils Absolute: 0 10*3/uL (ref 0.0–0.1)
Basophils Relative: 1 %
EOS ABS: 0.1 10*3/uL (ref 0.0–0.5)
Eosinophils Relative: 4 %
HCT: 24 % — ABNORMAL LOW (ref 39.0–52.0)
Hemoglobin: 7.1 g/dL — ABNORMAL LOW (ref 13.0–17.0)
IMMATURE GRANULOCYTES: 0 %
Lymphocytes Relative: 18 %
Lymphs Abs: 0.3 10*3/uL — ABNORMAL LOW (ref 0.7–4.0)
MCH: 24 pg — ABNORMAL LOW (ref 26.0–34.0)
MCHC: 29.6 g/dL — ABNORMAL LOW (ref 30.0–36.0)
MCV: 81.1 fL (ref 80.0–100.0)
Monocytes Absolute: 0.2 10*3/uL (ref 0.1–1.0)
Monocytes Relative: 12 %
Neutro Abs: 1.2 10*3/uL — ABNORMAL LOW (ref 1.7–7.7)
Neutrophils Relative %: 65 %
Platelets: 98 10*3/uL — ABNORMAL LOW (ref 150–400)
RBC: 2.96 MIL/uL — ABNORMAL LOW (ref 4.22–5.81)
RDW: 20 % — ABNORMAL HIGH (ref 11.5–15.5)
WBC: 1.8 10*3/uL — ABNORMAL LOW (ref 4.0–10.5)
nRBC: 0 % (ref 0.0–0.2)

## 2018-01-27 NOTE — Progress Notes (Signed)
[facility] Telephone: [phone] Fax: [phone]  PATIENT NAME: Rodney Stewart DOB: 1955-06-20 MRN: [id number]  PRIMARY CARE PROVIDER:   [name], [name], MD  REFERRING PROVIDER:  Dr Tama High [facility] RESPONSIBLE PARTY:   patient's daughter, Rodney Stewart, at [phone] or brother, Rodney Stewart, at [phone]   ASSESSMENT:     I visited them served Rodney Stewart. He did not make I contact your open his eyes to verbal cues. He was restless during palliative care visit. Asked nursing to give roxanol. He does appear critically ill. I've discussed with daughter Rodney Stewart purpose for palliative care visit. We talked about past medical history in the setting of chronic disease progression time. We talked about aggressive treatment vs comfort care. We talked about suffering. We talked about therapy with rehab in poor prognosis. We talked about recent events over the last two days with him becoming more lethargic and with clinical presentation likely pneumonia. Rodney Stewart asked about Hospice Home. We talked about criteria of hospice home and that all medications, interventions for aggressive care would need to be discontinued. Discuss would give Comfort Care medications including roxanol. Rodney Stewart asked if she could contact her family  for further discussion and will revisit  plan of care. He does remain a DNR. Rodney Stewart endorses that she called family and wishes are to discontinue medications, aggressive interventions and focus on Rodney Stewart with Comfort Care medications. Therapeutic listening and emotional support provided. Questions answered to satisfaction. I have updated nursing staff and order received to transfer to hospice home as he does appear to be within two to three weeks life expectancy.  1 / 21 / 2020 sodium 131, potassium 3.8, chloride 104, co2 23, calcium 8.1, bun 49, creatinine 2.41, glucose 375  1 / 24 / 2020 sodium 134, potassium 4.3, chloride 100, calcium  8.4, bun 42.5, creatinine 2.05, albumin 2.5, total protein 5.9, glucose 50, ammonia 40, WBC 2.8, hemoglobin 7.5, hematocrit 22.7, platelets 116   RECOMMENDATIONS and PLAN:   1. Palliative care encounter Z51.5; Palliative medicine team will continue to support patient, patient's family, and medical team. Visit consisted of counseling and education dealing with the complex and emotionally intense issues of symptom management and palliative care in the setting of serious and potentially life-threatening illness  Transfer to hospice home for end of life; clinically appears 2-3 weeks life expectancy  2. Agitation R45.1; continue ongoing emotional support, progression to end of life; comfort Ativan  3. Pain R52.0 secondary to metastatic disease with liver failure will continue to monitor on pain, Continue with current pain regimen with roxanol  I spent 120 minutes minutes providing this consultation,  From 11:30am to 1:30pm. More than 50% of the time in this consultation was spent coordinating communication.   HISTORY OF PRESENT ILLNESS:  Rodney Stewart is a 63 y.o. year old male with multiple medical problems including Renal cell carcinoma of left with metastasis to lung (Stage IV, N0, M1), history of nephrectomy 2014, chronic kidney disease stage 3, cirrhosis, ascites, not immune Hepatitis B, iron deficiency anemia, BPH, anemia, pancytopenia, thrombocytopenia, diabetes, hypertension, hypothyroidism, cataracts, history of gallstones, gerd, gout, neuropathy, history of hydrocele, history of umbilical hernia, right knee surgery, bilateral eye surgery, back surgery ruptured disc 1991, protein calorie malnutrition, etoh abuse. Hospitalize 9 / 13 / 2019 to 9 / 18 / 2019 for drainage from abdominal wall with hypovolemic shock secondary to large volume drainage from ascites fluid. He did require pressors, albumin transfusion and received Rocephin. Have  been attorney a secondary from liver cirrhosis with  dehydration. Acute renal injury prerenal azotemia with improve creatinine upon discharge. Etoh related cirrhosis with ascites post paracentesis with 2 L removed. Stage 4 renal cell carcinoma with metastasis too long. Palliative care consult during this hospitalization and DNR/DNI completed, as well as no interest in any forms of artificial feeding including peg tube. His wishes were during this hospitalization to continue to treat what is treatable. Hospitalized 10 / 18 / 2019 to 10 / 22 / 2019 with generalized weakness with lower extremity edema with work up significant for hyponatremia requiring Lasix IV drip and albumin replacement. He did undergo paracentesis for ascites and sodium improved. He was a DNR this hospitalization. Hospitalized 1/10 slash 2024 General weakness, body aches with hypothermia if any for septic shock with pneumonia. He was weaned off of oxygen. Abdominal ascites with advanced cirrhosis of liver with repeat abdominal ultrasound-guided paracentesis removed 8 L of fluid. He did require an insulin drip though was weaned off at discharge. Severe malnutrition secondary to metastatic disease. Lower extremity edema secondary to third spacing from hypoalbuminemia with chronic cirrhosis treated with Lasix. Acute renal failure chronic kidney disease stage 3. He was seen by palliative care during hospitalization  which did require BiPAP. He is followed by palliative care in the clinic also receiving second line nivolumab and tolerating treatment. His performance status was declining over the past several months secondary to end-stage cirrhosis. He is a DNR. He was discharged to short-term rehab. 3 days ago he was ambulating though very weak and requiring assistance with ADLs. Slow to progress with therapy. He was oriented. Staff endorses yesterday he became more confused with cough and congestion. 1 / 26 / 2020 roxanol initiated for pain. 1 / 27 / 2020 started on Mucinex with Tessalon Perles for cough  and congestion with CBC and chest x-ray pending. Staff endorses today he's been lethargic, confused, restless with congested cough. At present he is lying in bed, confused, mumbling, lethargic, appears critically ill. Daughter Rodney Stewart is at bedside.Marland KitchenHe is divorced, lives alone. 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 Palliative Care was asked to help address goals of care.   CODE STATUS: DNR  PPS: 20% HOSPICE ELIGIBILITY/DIAGNOSIS: possible 2-3 weeks life expectance with clinical presentation  PAST MEDICAL HISTORY:  Past Medical History:  Diagnosis Date  . Anemia   . Ascites   . BPH (benign prostatic hyperplasia)   . Cirrhosis ([place])   . CKD (chronic kidney disease) stage 3, GFR 30-59 ml/min (HCC)   . Clear cell renal cell carcinoma ([place]) 2014   Left Nephrectomy.  . Colon polyps   . Diabetes mellitus without complication ([place])    type 2   . Dyspnea    with exertion  . GERD (gastroesophageal reflux disease)   . History of gout   . History of nephrectomy    Left  . Hypertension   . Neuropathy   . Renal Cancer    Renal Cancer  . Renal cell carcinoma of left kidney (HCC)    mets to lungs  . Umbilical hernia     SOCIAL HX:  Social History   Tobacco Use  . Smoking status: Former Smoker    Packs/day: 1.00    Types: Cigarettes    Last attempt to quit: 04/15/1993    Years since quitting: 24.8  . Smokeless tobacco: Never Used  Substance Use Topics  . Alcohol use:  Not Currently    ALLERGIES: No Known Allergies   PERTINENT MEDICATIONS:  Outpatient Encounter Medications as of 01/27/2018  Medication Sig  . allopurinol (ZYLOPRIM) 100 MG tablet Take 1 tablet (100 mg total) by mouth daily.  Marland Kitchen ALPRAZolam (XANAX) 1 MG tablet Take 1 tablet (1 mg total) by mouth 2 (two) times daily as needed for anxiety or sleep.  Marland Kitchen aspirin EC 81 MG tablet Take 81 mg by mouth daily.  Marland Kitchen docusate sodium (COLACE) 100 MG capsule Take 1 capsule  (100 mg total) by mouth 2 (two) times daily as needed for mild constipation.  . feeding supplement, ENSURE ENLIVE, (ENSURE ENLIVE) LIQD Take 237 mLs by mouth 3 (three) times daily between meals.  . furosemide (LASIX) 40 MG tablet Take 1 tablet (40 mg total) by mouth daily.  . insulin aspart (NOVOLOG) 100 UNIT/ML FlexPen Before meals 131-180 2 units, 181-240 4 units, 241-300 6 units, 301-350 8 units, 351-400 10 units, >400 12 units  . Insulin Detemir (LEVEMIR FLEXTOUCH) 100 UNIT/ML Pen Inject 18 Units into the skin 2 (two) times daily. With food  . Insulin Pen Needle 32G X 4 MM MISC 1 Device by Does not apply route 2 (two) times daily. E11.9  . lactulose (CHRONULAC) 10 GM/15ML solution Take 45 mLs (30 g total) by mouth daily.  Marland Kitchen levothyroxine (SYNTHROID, LEVOTHROID) 75 MCG tablet Take 1 tablet (75 mcg total) by mouth daily before breakfast. 30 minutes  . lovastatin (MEVACOR) 20 MG tablet Take 1 tablet (20 mg total) by mouth daily at 6 PM.  . magic mouthwash w/lidocaine SOLN Take 5 mLs by mouth 3 (three) times daily as needed for mouth pain. Swish and spit (Patient not taking: Reported on 12/23/2017)  . Multiple Vitamin (MULTIVITAMIN WITH MINERALS) TABS tablet Take 1 tablet by mouth daily.  . mupirocin ointment (BACTROBAN) 2 % Apply 1 application topically 3 (three) times daily. Left big toe and right back (Patient not taking: Reported on 12/23/2017)  . ONE TOUCH ULTRA TEST test strip Bid  . oxyCODONE (ROXICODONE) 5 MG immediate release tablet Take 1 tablet (5 mg total) by mouth every 6 (six) hours as needed for severe pain or breakthrough pain.  . polyethylene glycol (MIRALAX) packet Take 17 g by mouth daily.  . potassium chloride SA (K-DUR,KLOR-CON) 20 MEQ tablet Take 1 tablet (20 mEq total) by mouth 2 (two) times daily.  . rifaximin (XIFAXAN) 550 MG TABS tablet Take 1 tablet (550 mg total) by mouth 2 (two) times daily at 8 am and 10 pm.  . tamsulosin (FLOMAX) 0.4 MG CAPS capsule Take 1 capsule (0.4  mg total) by mouth every other day. With supper   No facility-administered encounter medications on file as of 01/27/2018.     PHYSICAL EXAM:   General: frail appearing, critically ill appearing male, lethargic, confused Cardiovascular: regular rate and rhythm Pulmonary: rhonchi Abdomen: ascities GU: no suprapubic tenderness Extremities: muscle wasting Skin: no rashes Neurological: Weakness but otherwise nonfocal  Lysle Yero Ihor Gully, NP

## 2018-01-28 ENCOUNTER — Inpatient Hospital Stay: Payer: Medicare HMO | Admitting: Internal Medicine

## 2018-02-01 DEATH — deceased

## 2018-03-27 ENCOUNTER — Ambulatory Visit: Payer: Medicare HMO | Admitting: Internal Medicine

## 2018-03-27 ENCOUNTER — Telehealth: Payer: Self-pay

## 2018-03-27 NOTE — Telephone Encounter (Signed)
Left message for patient to return call back. [facility] may give information.  Dr Olivia Mackie would like to set up a webex appointment

## 2018-11-06 ENCOUNTER — Telehealth: Payer: Self-pay | Admitting: Internal Medicine

## 2018-11-06 NOTE — Telephone Encounter (Signed)
I called pt twice unable to leave vm.

## 2018-11-07 ENCOUNTER — Ambulatory Visit: Payer: Medicare HMO

## 2018-11-07 ENCOUNTER — Telehealth: Payer: Self-pay

## 2018-11-07 ENCOUNTER — Ambulatory Visit: Payer: Medicare HMO | Admitting: Internal Medicine

## 2018-11-07 NOTE — Telephone Encounter (Signed)
Called at scheduled time to complete annual wellness and health maintenance updates for his doctor. No answer. Unable to leave a voicemail. Please reschedule as appropriate.

## 2018-12-01 IMAGING — CR DG CHEST 2V
2 series · 2 of 2 positions shown · non-contrast
Comparison: CT chest dated August 30, 2017. Chest x-ray dated
January 25, 2017.

CLINICAL DATA: Shortness of breath over the past 6 months, worse
over the past 2 days. History of metastatic renal cell carcinoma

EXAM:
CHEST - 2 VIEW

[chest lat]
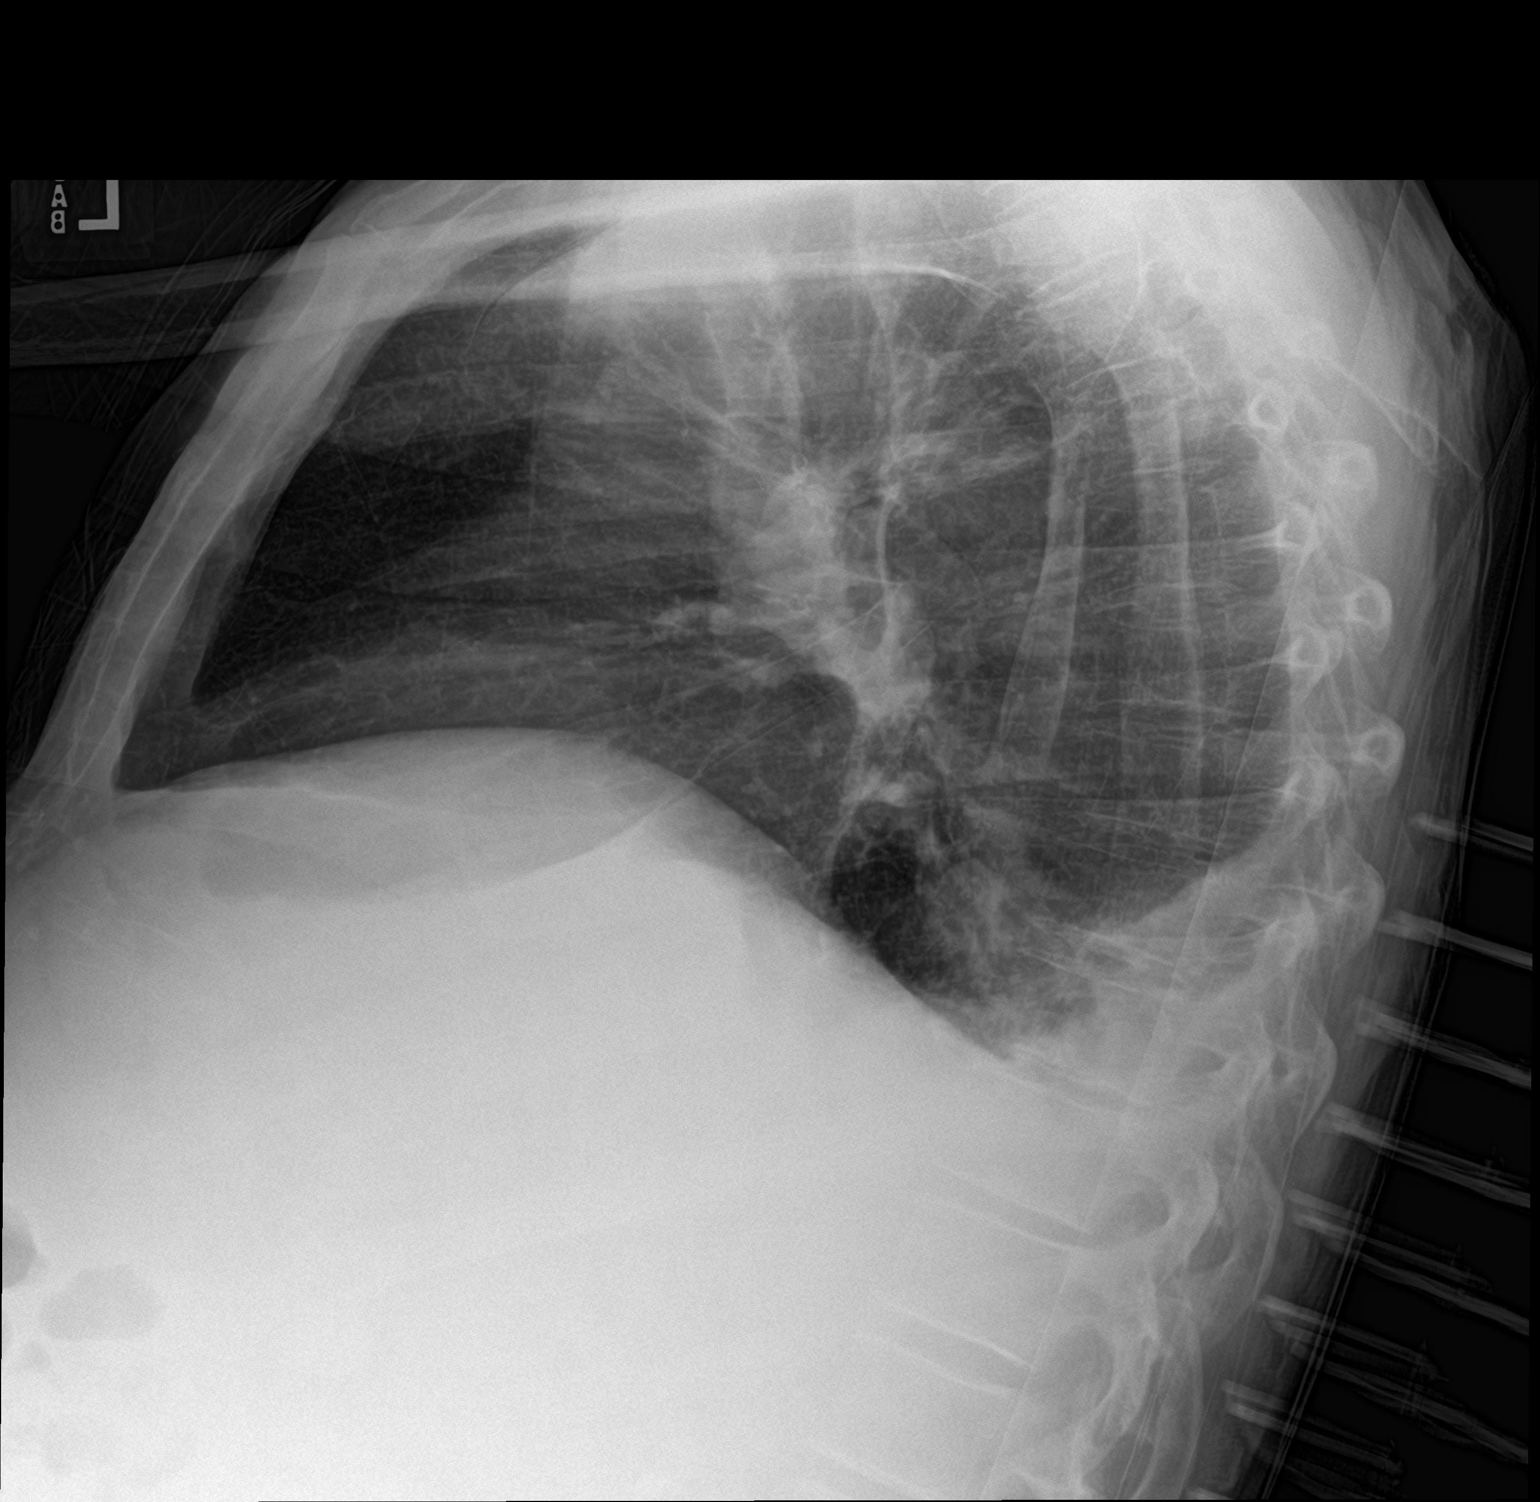

[chest ap]
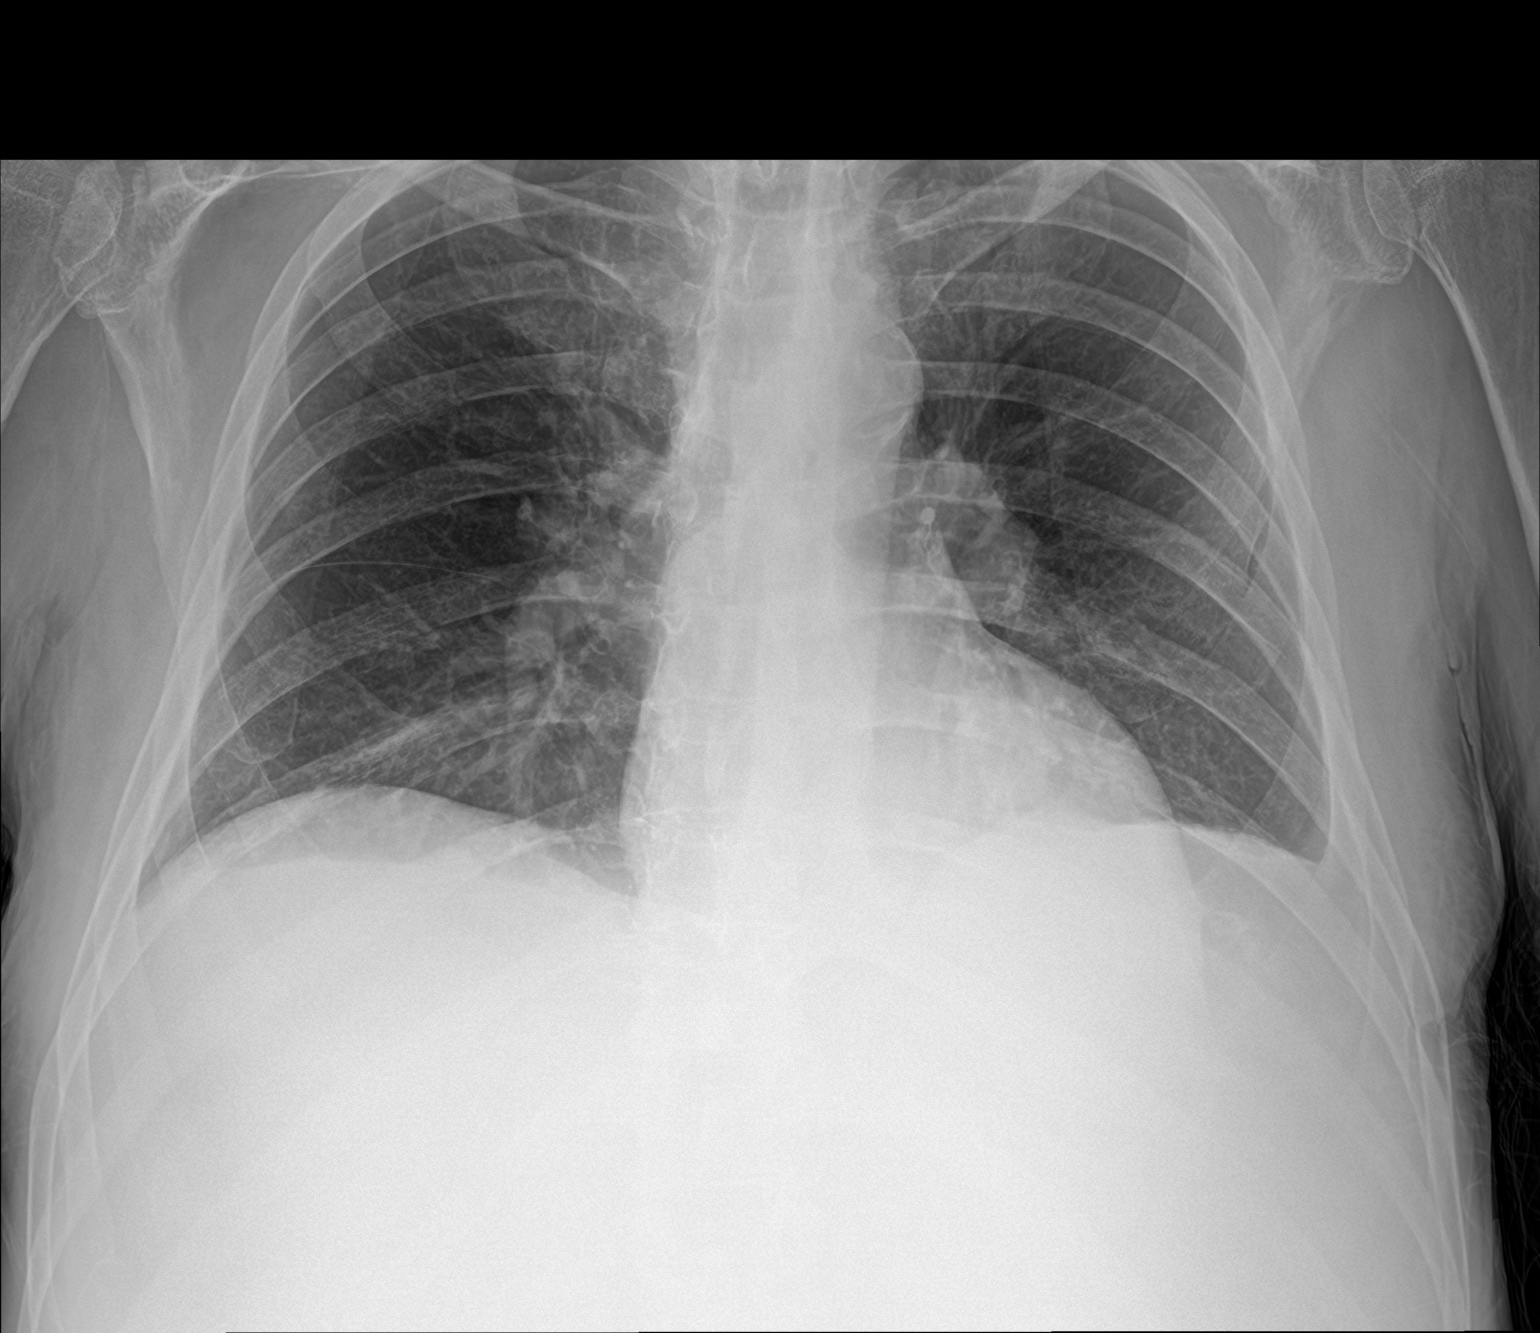

[2 of 2 positions shown; findings below may reference images not displayed]

FINDINGS: Normal heart size. Fullness of both hilar regions is unchanged.
Small bilateral pleural effusions. No consolidation or pneumothorax.
No acute osseous abnormality.
IMPRESSION: 1. New small bilateral pleural effusions.
2. Unchanged bilateral hilar fullness corresponding to known hilar
lymphadenopathy.

## 2019-01-25 IMAGING — CR DG ABDOMEN ACUTE W/ 1V CHEST
1 series · 4 of 4 positions shown · non-contrast
Comparison: Chest x-ray 10/18/2017 and CT abdomen/pelvis 09/20/2017

CLINICAL DATA: Lower abdominal pain and nausea. No bowel movement
in 4 days.

EXAM:
DG ABDOMEN ACUTE W/ 1V CHEST

[Series 1: dg abd acute w/chest · 0.14mm/px · 4 of 4 slices shown]
[im 1/4]
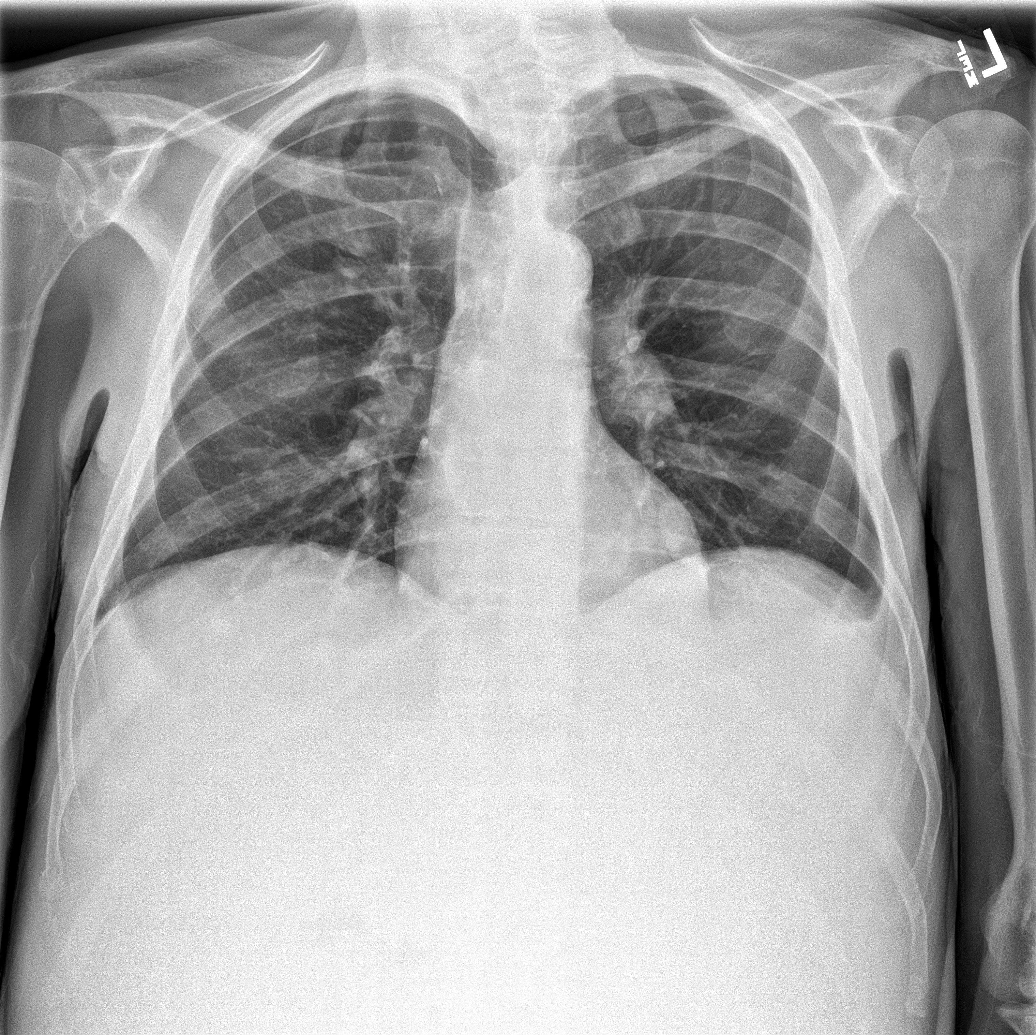
[im 2/4]
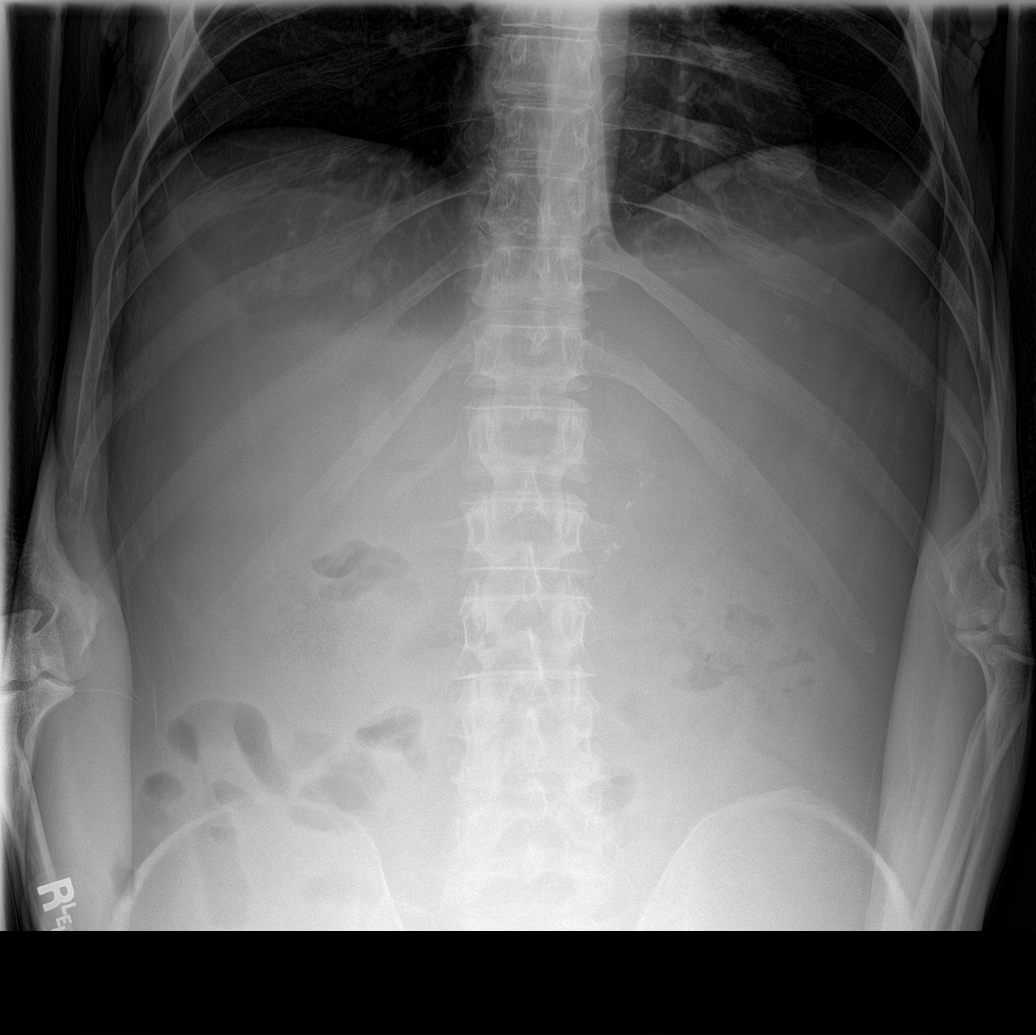
[im 3/4]
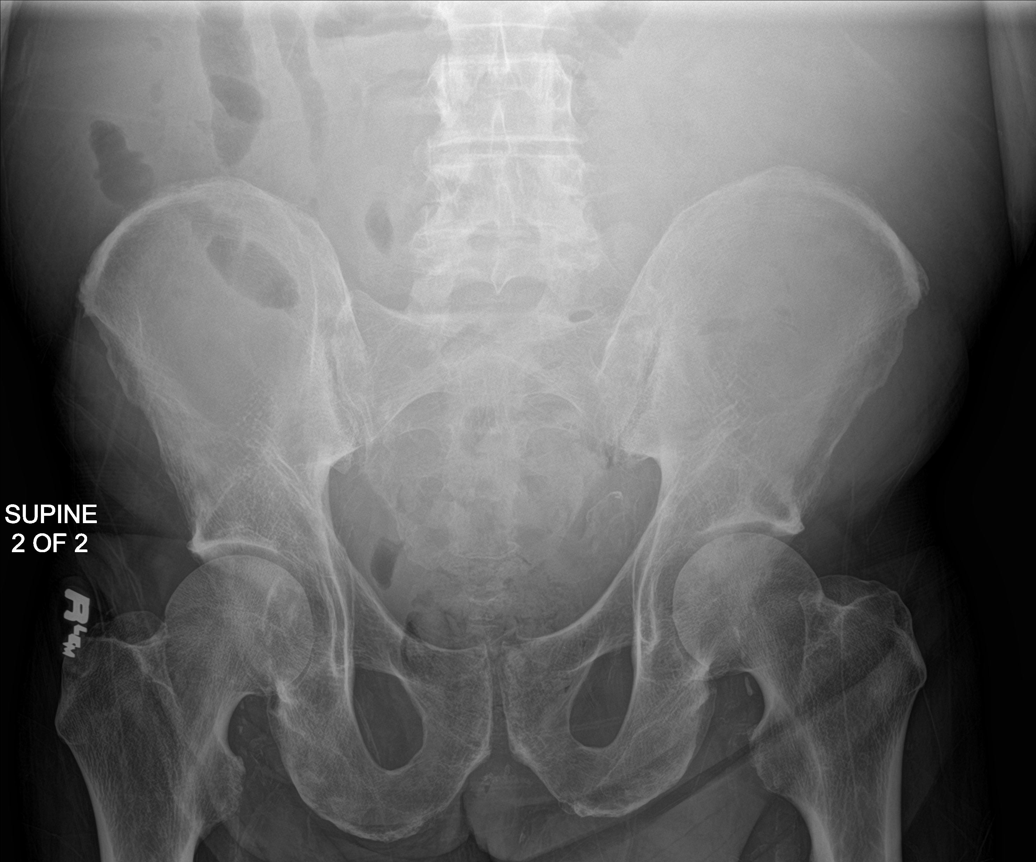
[im 4/4]
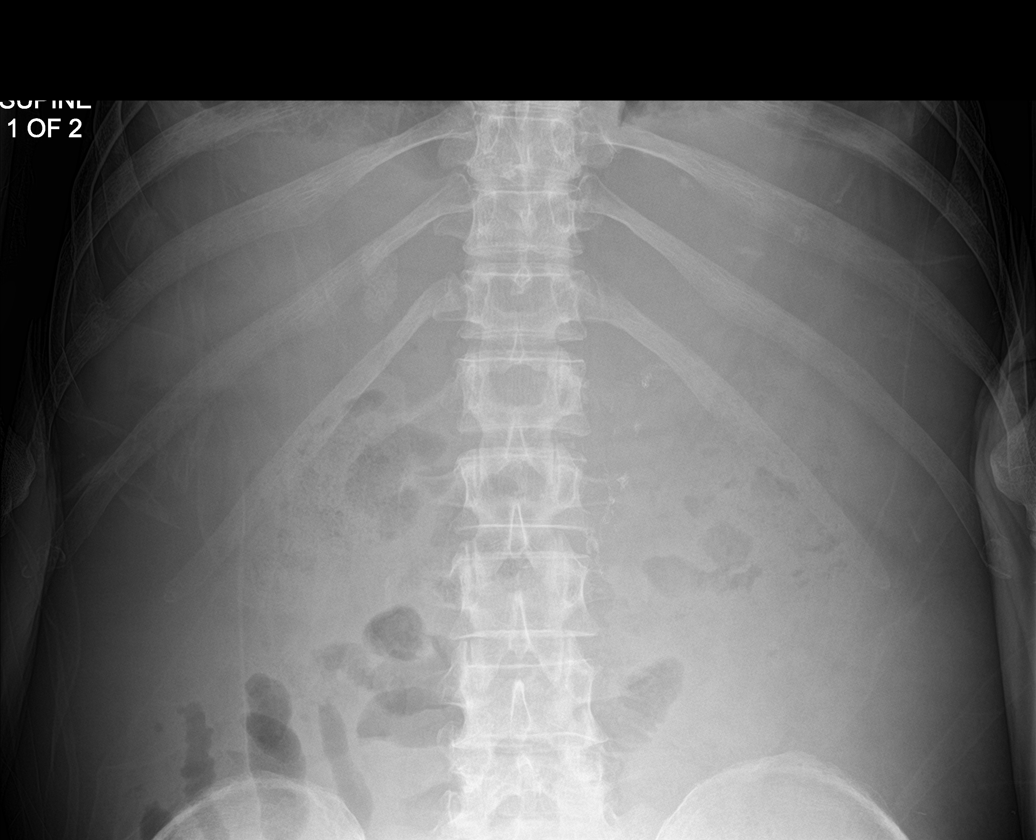

[4 of 4 positions shown; findings below may reference images not displayed]

FINDINGS: The upright chest x-ray demonstrates normal cardiomediastinal
silhouette. The pulmonary hila appear normal in stable. No
infiltrates or edema. There is a small left pleural effusion which
appears stable.

Two views of the abdomen demonstrate scattered stool in the colon
and down into the rectum. There are few air-filled loops of small
bowel but no distension or air-fluid levels to suggest obstruction.
No free air. Overall increased density of the abdomen, poor
definition of the soft tissue structures and centralization of the
bowel suggests ascites.
IMPRESSION: 1. No acute cardiopulmonary findings. There is a small persistent
left pleural effusion.
2. Suspect abdominal ascites.
3. Scattered stool in the colon and down into the rectum. No
findings for small bowel obstruction or free air.

## 2019-02-24 IMAGING — DX DG CHEST 1V PORT
1 series · 1 of 1 positions shown · non-contrast
Comparison: One-view chest x-ray 01/10/2017

CLINICAL DATA: Acute respiratory failure.

EXAM:
PORTABLE CHEST 1 VIEW

[chest ap]
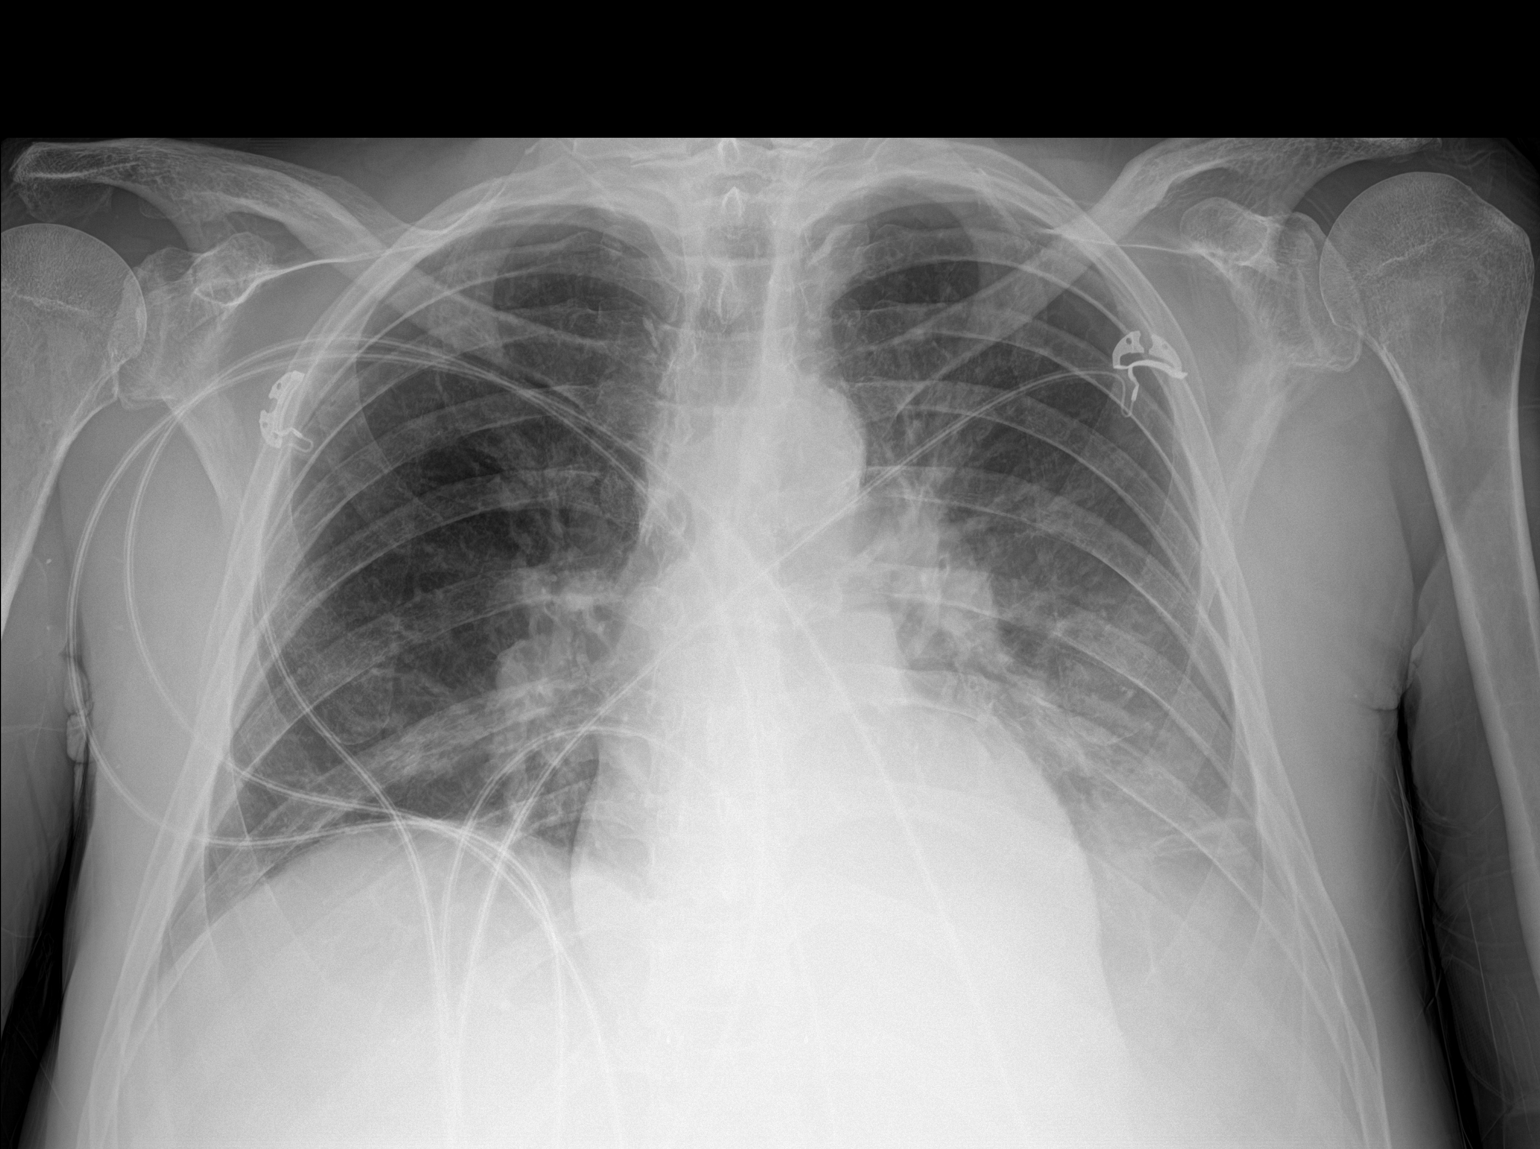

[1 of 1 positions shown; findings below may reference images not displayed]

FINDINGS: Heart size is normal. Aortic atherosclerosis is again seen. Left
basilar airspace disease is similar the prior study. Left effusion
is suspected. Minimal atelectasis is present at the right base.
IMPRESSION: 1. Similar appearance of left basilar airspace disease, indicating
pneumonia.
2. Probable small left pleural effusion.

## 2019-04-16 ENCOUNTER — Telehealth: Payer: Self-pay | Admitting: Internal Medicine

## 2019-04-16 NOTE — Telephone Encounter (Signed)
UNABLE TO LEAVE MESSAGE. Pt due to reschedule Medicare Annual Wellness Visit (AWV) either virtually or audio only.  Last AWV 11/05/17; please schedule at anytime with Denisa O'Brien-Blaney at [facility].

## 2019-07-04 IMAGING — US US PARACENTESIS
1 series · 7 of 7 positions shown · non-contrast
Comparison: none

INDICATION: Metastatic renal cell carcinoma, renal insufficiency, cirrhosis,
recurrent ascites. Request made for diagnostic and therapeutic
paracentesis.

[Series 1: us paracentesis · 0.26mm/px · 7 of 7 slices shown]
[im 1/7]
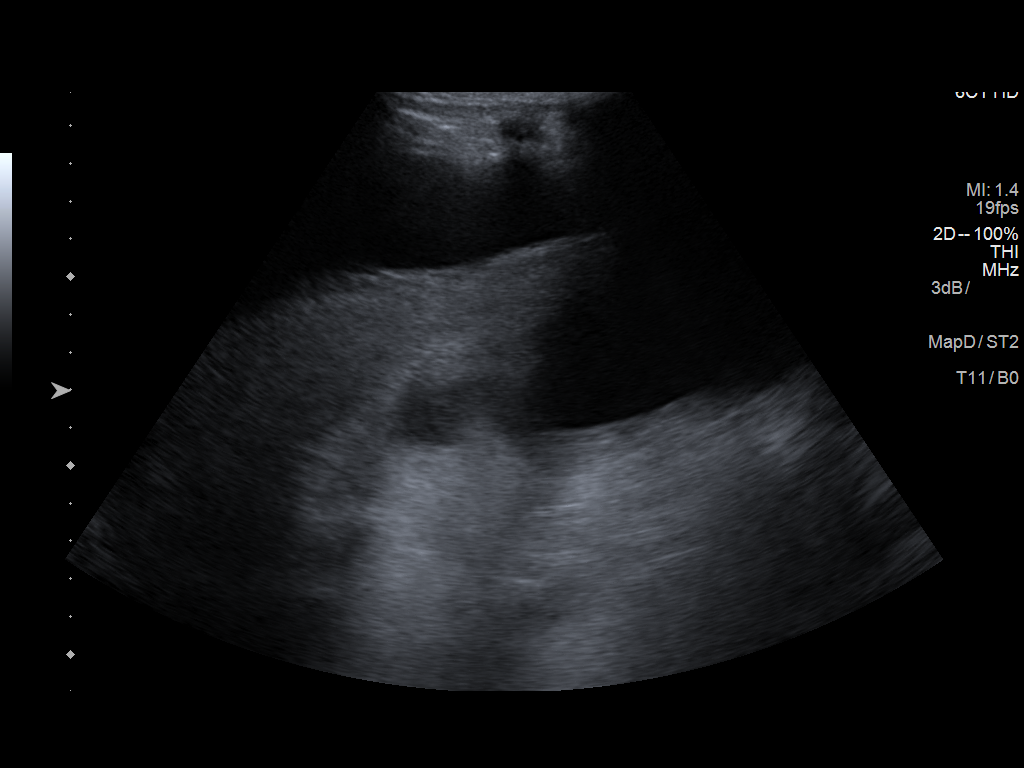
[im 2/7]
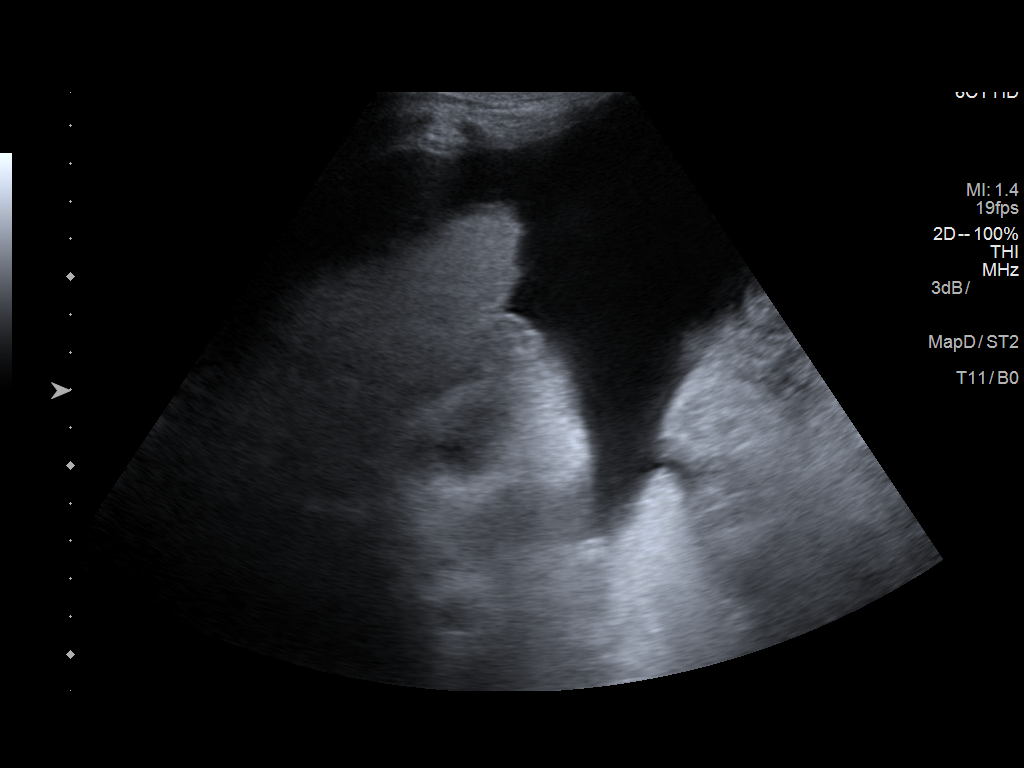
[im 3/7]
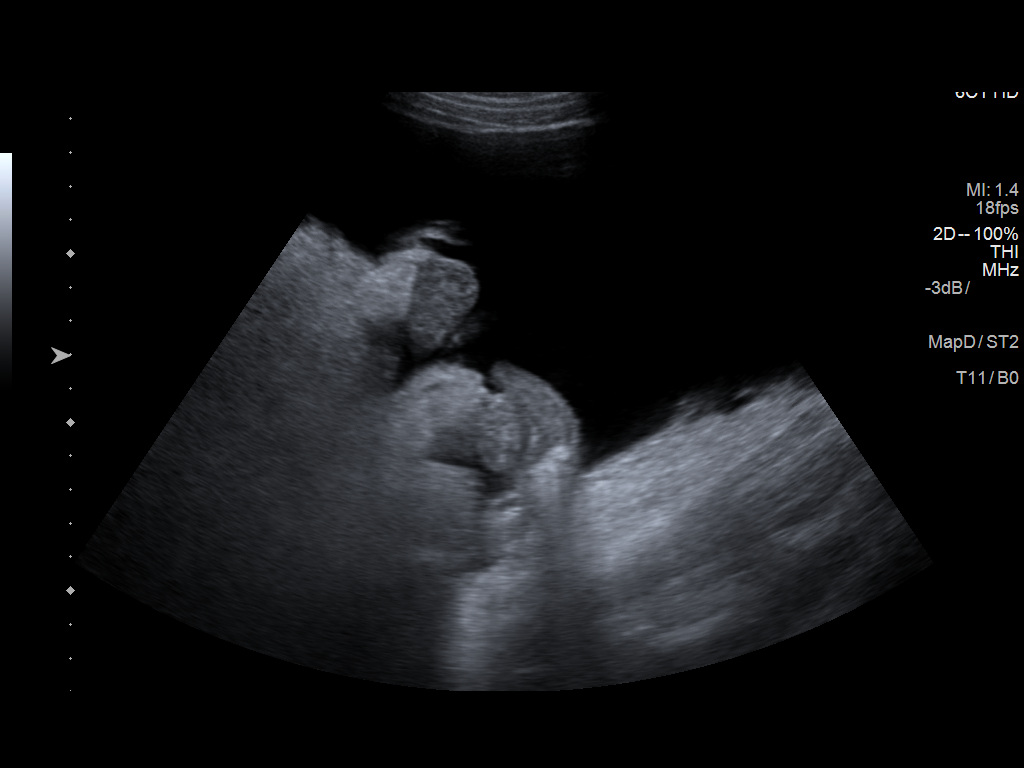
[im 4/7]
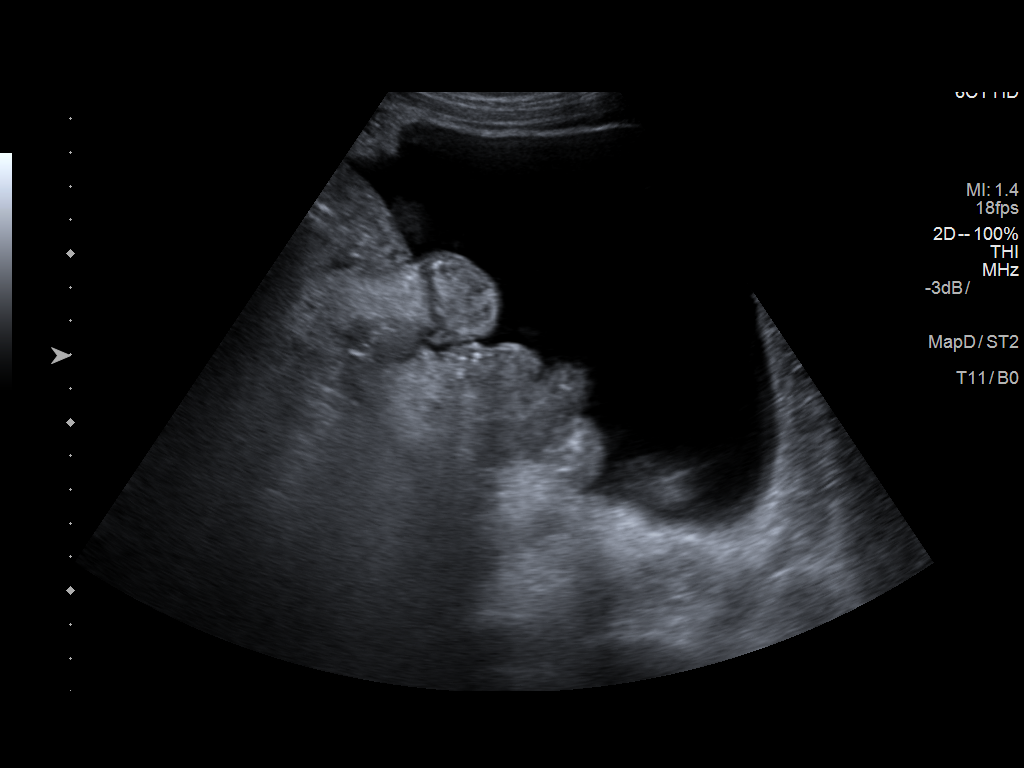
[im 5/7]
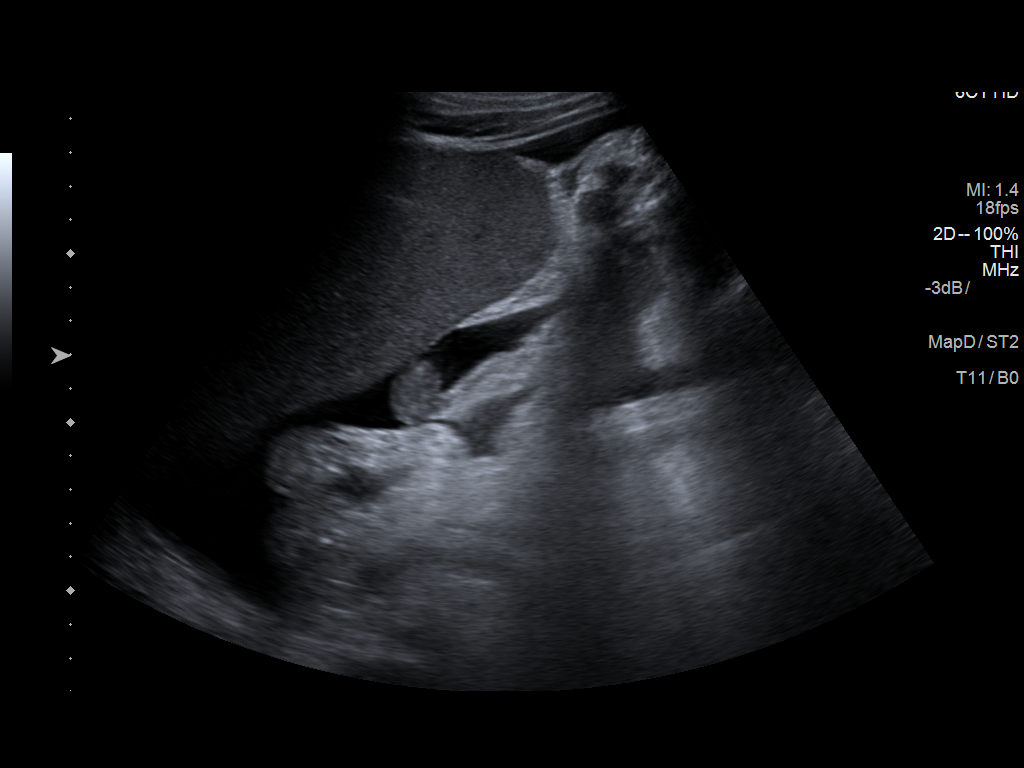
[im 6/7]
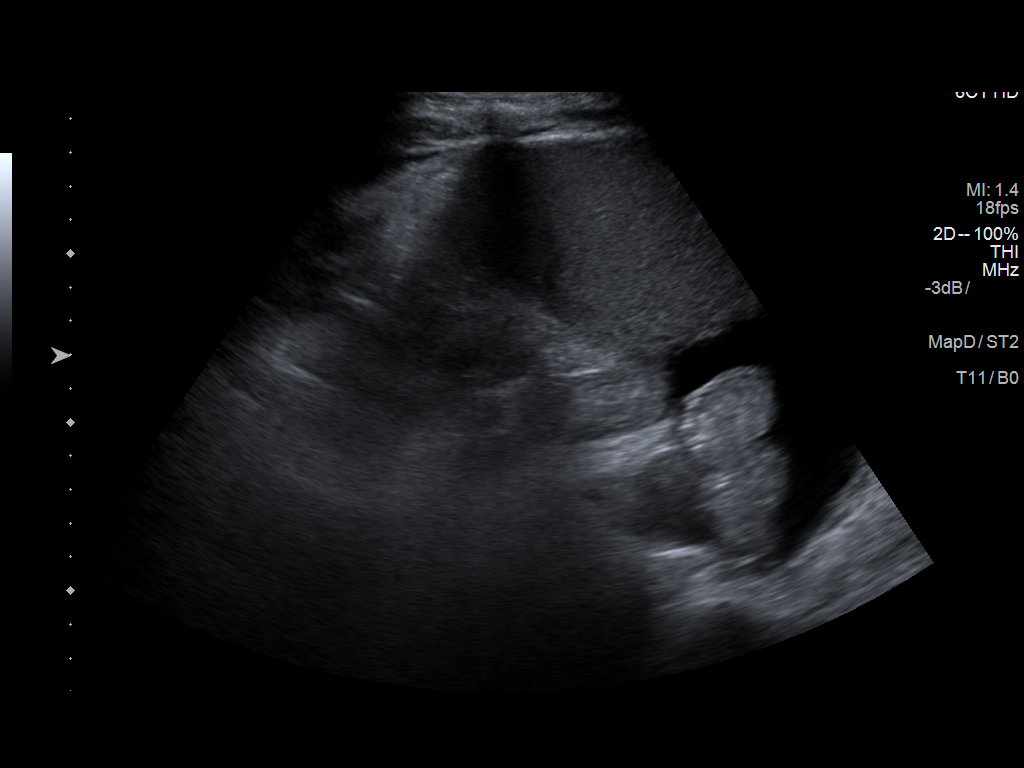
[im 7/7]
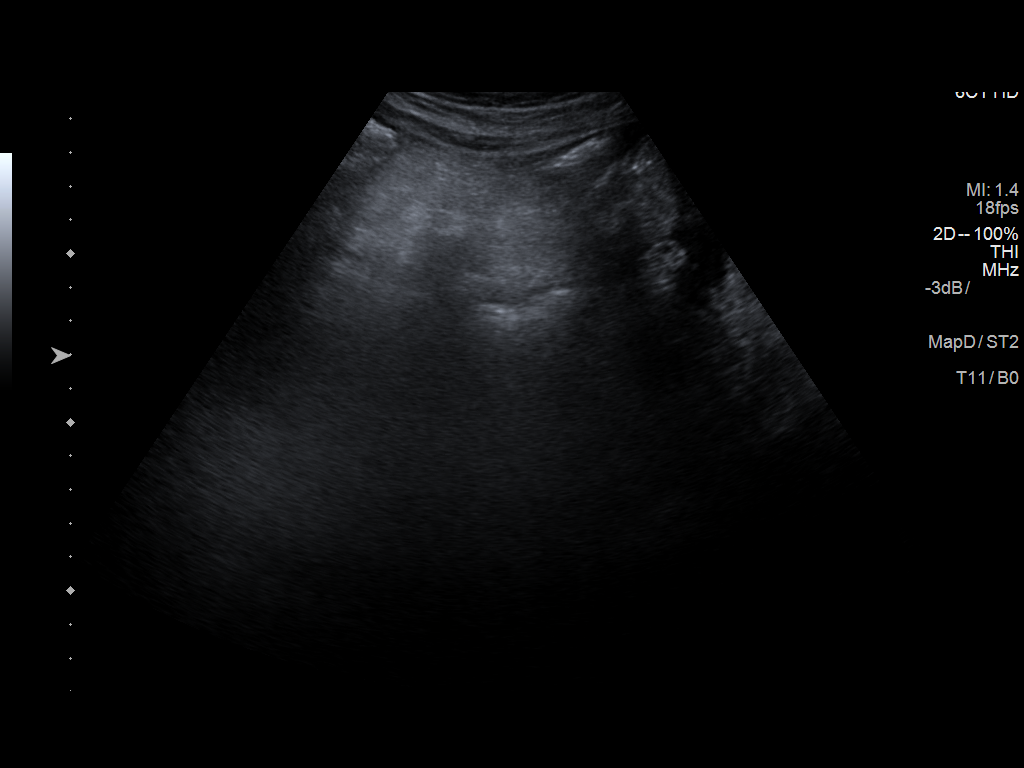

[7 of 7 positions shown; findings below may reference images not displayed]

EXAM:
ULTRASOUND GUIDED DIAGNOSTIC AND THERAPEUTIC PARACENTESIS

MEDICATIONS:
None.

COMPLICATIONS:
None immediate.

PROCEDURE:
Informed written consent was obtained from the patient after a
discussion of the risks, benefits and alternatives to treatment. A
timeout was performed prior to the initiation of the procedure.

Initial ultrasound scanning demonstrates a large amount of ascites
within the right lower abdominal quadrant. The right lower abdomen
was prepped and draped in the usual sterile fashion. 1% lidocaine
was used for local anesthesia.

Following this, a 6 Fr Safe-T-Centesis catheter was introduced. An
ultrasound image was saved for documentation purposes. The
paracentesis was performed. The catheter was removed and a dressing
was applied. The patient tolerated the procedure well without
immediate post procedural complication.
FINDINGS: A total of approximately 9 liters of yellow fluid was removed.
Samples were sent to the laboratory as requested by the clinical
team.
IMPRESSION: Successful ultrasound-guided diagnostic and therapeutic paracentesis
yielding 9 liters of peritoneal fluid. The patient will receive IV
albumin infusion postprocedure.

## 2023-11-19 NOTE — Telephone Encounter (Signed)
 open in error
